# Patient Record
Sex: Female | Born: 1955
Health system: Southern US, Community
[De-identification: ages and names within clinical notes are randomized; demographics above are authoritative.]

## PROBLEM LIST (undated history)

## (undated) ENCOUNTER — Emergency Department (HOSPITAL_COMMUNITY): Admission: EM | Payer: Medicare Other | Source: Home / Self Care

## (undated) DIAGNOSIS — R188 Other ascites: Secondary | ICD-10-CM

## (undated) DIAGNOSIS — G4700 Insomnia, unspecified: Secondary | ICD-10-CM

## (undated) DIAGNOSIS — K567 Ileus, unspecified: Secondary | ICD-10-CM

## (undated) DIAGNOSIS — Z8701 Personal history of pneumonia (recurrent): Secondary | ICD-10-CM

## (undated) DIAGNOSIS — N200 Calculus of kidney: Secondary | ICD-10-CM

## (undated) DIAGNOSIS — Z9889 Other specified postprocedural states: Secondary | ICD-10-CM

## (undated) DIAGNOSIS — R9431 Abnormal electrocardiogram [ECG] [EKG]: Secondary | ICD-10-CM

## (undated) DIAGNOSIS — T4145XA Adverse effect of unspecified anesthetic, initial encounter: Secondary | ICD-10-CM

## (undated) DIAGNOSIS — G629 Polyneuropathy, unspecified: Secondary | ICD-10-CM

## (undated) DIAGNOSIS — F329 Major depressive disorder, single episode, unspecified: Secondary | ICD-10-CM

## (undated) DIAGNOSIS — J189 Pneumonia, unspecified organism: Secondary | ICD-10-CM

## (undated) DIAGNOSIS — M159 Polyosteoarthritis, unspecified: Secondary | ICD-10-CM

## (undated) DIAGNOSIS — F419 Anxiety disorder, unspecified: Secondary | ICD-10-CM

## (undated) DIAGNOSIS — T8859XA Other complications of anesthesia, initial encounter: Secondary | ICD-10-CM

## (undated) DIAGNOSIS — R161 Splenomegaly, not elsewhere classified: Secondary | ICD-10-CM

## (undated) DIAGNOSIS — K7460 Unspecified cirrhosis of liver: Secondary | ICD-10-CM

## (undated) DIAGNOSIS — Z9289 Personal history of other medical treatment: Secondary | ICD-10-CM

## (undated) DIAGNOSIS — F32A Depression, unspecified: Secondary | ICD-10-CM

## (undated) DIAGNOSIS — Z8742 Personal history of other diseases of the female genital tract: Secondary | ICD-10-CM

## (undated) DIAGNOSIS — N83209 Unspecified ovarian cyst, unspecified side: Secondary | ICD-10-CM

## (undated) DIAGNOSIS — M1990 Unspecified osteoarthritis, unspecified site: Secondary | ICD-10-CM

## (undated) DIAGNOSIS — R011 Cardiac murmur, unspecified: Secondary | ICD-10-CM

## (undated) DIAGNOSIS — E039 Hypothyroidism, unspecified: Secondary | ICD-10-CM

## (undated) DIAGNOSIS — M810 Age-related osteoporosis without current pathological fracture: Secondary | ICD-10-CM

## (undated) DIAGNOSIS — G8929 Other chronic pain: Secondary | ICD-10-CM

## (undated) DIAGNOSIS — D61818 Other pancytopenia: Secondary | ICD-10-CM

## (undated) DIAGNOSIS — K769 Liver disease, unspecified: Secondary | ICD-10-CM

## (undated) DIAGNOSIS — R110 Nausea: Secondary | ICD-10-CM

## (undated) DIAGNOSIS — K8020 Calculus of gallbladder without cholecystitis without obstruction: Secondary | ICD-10-CM

## (undated) DIAGNOSIS — R87629 Unspecified abnormal cytological findings in specimens from vagina: Secondary | ICD-10-CM

## (undated) HISTORY — PX: AUGMENTATION MAMMAPLASTY: SUR837

## (undated) HISTORY — DX: Anxiety disorder, unspecified: F41.9

## (undated) HISTORY — DX: Polyosteoarthritis, unspecified: M15.9

## (undated) HISTORY — PX: FRACTURE SURGERY: SHX138

## (undated) HISTORY — DX: Unspecified cirrhosis of liver: K74.60

## (undated) HISTORY — DX: Nausea: R11.0

## (undated) HISTORY — DX: Depression, unspecified: F32.A

## (undated) HISTORY — PX: KNEE SURGERY: SHX244

## (undated) HISTORY — DX: Other ascites: R18.8

## (undated) HISTORY — DX: Other chronic pain: G89.29

## (undated) HISTORY — DX: Calculus of gallbladder without cholecystitis without obstruction: K80.20

## (undated) HISTORY — DX: Age-related osteoporosis without current pathological fracture: M81.0

## (undated) HISTORY — PX: HIP SURGERY: SHX245

## (undated) HISTORY — DX: Personal history of pneumonia (recurrent): Z87.01

## (undated) HISTORY — DX: Personal history of other diseases of the female genital tract: Z87.42

## (undated) HISTORY — DX: Unspecified abnormal cytological findings in specimens from vagina: R87.629

## (undated) HISTORY — DX: Abnormal electrocardiogram (ECG) (EKG): R94.31

## (undated) HISTORY — PX: COLONOSCOPY WITH ESOPHAGOGASTRODUODENOSCOPY (EGD) AND ESOPHAGEAL DILATION (ED): SHX6495

## (undated) HISTORY — DX: Unspecified ovarian cyst, unspecified side: N83.209

## (undated) HISTORY — DX: Splenomegaly, not elsewhere classified: R16.1

## (undated) HISTORY — PX: ESOPHAGOGASTRODUODENOSCOPY W/ BANDING: SHX1530

## (undated) HISTORY — DX: Insomnia, unspecified: G47.00

## (undated) HISTORY — DX: Calculus of kidney: N20.0

## (undated) HISTORY — PX: ORIF HIP FRACTURE: SHX2125

## (undated) NOTE — *Deleted (*Deleted)
[facility]  PROGRESS NOTE  Megan Roberson JHE:[id number] DOB: 07-07-1956 DOA: 08/13/2020 PCP: Celene Squibb, MD Admit date - 08/13/2020   Admitting Physician Bernadette Hoit, DO  Outpatient Primary MD for the patient is Celene Squibb, MD  LOS - 2 Brief Narrative   Megan Roberson is a 45 y.o. year old female with medical history significant for Pancytopenia, cirrhosis of liver, hypothyroidism, GERD who presents on 08/13/2020 with generalized weakness, nausea, recurrent falls and left foot pain and was found to have profound ascites, hepatic encephalopathy, and acute fracture of medial cuneiform of the left foot.  Patient was started on ceftriaxone due to concern for UTI given positive UA but patient denies any urinary symptoms.  Patient underwent IR guided paracentesis on 10/13 with removal of 2 L of ascitic fluid.   Subjective  Today increased nausea, decreased appetite, increased weight loss over several weeks.  As well as recurrent falls.  This morning states she still feels uncomfortable with some abdominal pain, denies any urinary symptoms, no cough, no emesis, no melena.  A & P   Decompensated cirrhosis with hepatic encephalopathy and ascites in the setting of nonadherence with lactulose therapy due to nausea/poor taste.  Now much more alert and oriented to self and place in context.  Denies any hematemesis or melena.  Has a history of esophageal varices.  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  Iron panel seems more consistent with iron deficiency.  Not having any active signs of bleeding -Closely monitor CBC, transfuse hemoglobin  less than 7, platelets less than 10,000  Generalized weakness with recurrent falls, high concern for failure to thrive Suspect general failure to thrive in setting of chronic liver disease, B12 within normal limits, TSH slightly elevated at 6.5, thiamine deficiency also possibility, electrolytes all within normal limits -Check B1 -Continue home Synthroid -PT eval given left foot fracture -Dietary consulted, protein supplementation  Abnormal UA.  Does not have any urinary symptoms.  Not likely UTI -Continue to monitor urine culture, IV ceftriaxone for SBP rule out  GERD, stable -Continue oral PPI continue IV ceftriaxone while rule out SBP above  Mood disorder, stable -Continue home Zoloft  Left minimally displaced medial cuneiform foot fracture secondary to fall Cast placed in the ED, outpatient orthopedic follow-up -Oxycodone 5 mg every 8 hours for moderate/severe pain   Family Communication  : None at bedside  Code Status : Full  Disposition Plan  :  Patient is from home. Anticipated d/c date:  1-2. Barriers to d/c or necessity for inpatient status:  Currently requiring IV ceftriaxone due to high concern for SBP, awaiting fluid analysis of ascites, also closely monitor hemoglobin, as well as improvement for sustained normal mental status in setting of hepatic encephalopathy Consults  : None  Procedures  : IR guided paracentesis, 10/13  DVT Prophylaxis  : SCDs MDM: The below labs and imaging reports were reviewed and summarized above.  Medication management as above.  Lab Results  Component Value Date   PLT 50 (L) 08/15/2020    Diet :  Diet Order            Diet 2 gram sodium Room service appropriate? Yes; Fluid consistency: Thin  Diet effective  now                  Inpatient Medications Scheduled Meds: . feeding supplement  237 mL Oral BID BM  . furosemide  40 mg Oral Daily  . lactulose  20 g Oral TID  . levothyroxine  75 mcg Oral QAC breakfast  .  pantoprazole  40 mg Oral Daily  . potassium chloride  20 mEq Oral Q T,Th,S,Su  . rifaximin  550 mg Oral BID  . sertraline  50 mg Oral Daily  . spironolactone  150 mg Oral Daily   Continuous Infusions: . cefTRIAXone (ROCEPHIN)  IV Stopped (08/15/20 0817)   PRN Meds:.diclofenac Sodium, ondansetron, oxyCODONE, prochlorperazine, traZODone  Antibiotics  :   Anti-infectives (From admission, onward)   Start     Dose/Rate Route Frequency Ordered Stop   08/14/20 2359  cefTRIAXone (ROCEPHIN) 1 g in sodium chloride 0.9 % 100 mL IVPB        1 g 200 mL/hr over 30 Minutes Intravenous Every 24 hours 08/14/20 0120     08/14/20 1000  rifaximin (XIFAXAN) tablet 550 mg        550 mg Oral 2 times daily 08/14/20 0834     08/13/20 2300  cefTRIAXone (ROCEPHIN) 1 g in sodium chloride 0.9 % 100 mL IVPB        1 g 200 mL/hr over 30 Minutes Intravenous  Once 08/13/20 2257 08/14/20 0026       Objective   Vitals:   08/14/20 2048 08/15/20 0442 08/15/20 0900 08/15/20 0933  BP: 116/72 127/73 114/71 110/79  Pulse: 77 76 80   Resp: 16 18 17    Temp: 99.1 F (37.3 C) 98.7 F (37.1 C) 98.8 F (37.1 C)   TempSrc: Oral Oral Oral   SpO2: 97% 93% 96%   Weight:      Height:        SpO2: 96 % O2 Flow Rate (L/min): 0 L/min FiO2 (%): 21 %  Wt Readings from Last 3 Encounters:  08/14/20 57 kg  08/06/20 54.9 kg  06/04/20 63.5 kg     Intake/Output Summary (Last 24 hours) at 08/15/2020 1307 Last data filed at 08/15/2020 0500 Gross per 24 hour  Intake 100 ml  Output -  Net 100 ml    Physical Exam:     Awake Alert, Oriented to self, place, context Thin, frail No new F.N deficits,  [place].AT, Normal respiratory effort on room air, CTAB RRR,No Gallops,Rubs or new Murmurs, Decreased abdominal sounds, soft distended abdomen, soft, no rebound tenderness Bruising along left toes   I have personally reviewed the following:   Data Reviewed:  CBC Recent Labs  Lab 08/13/20 1815 08/14/20 0621  08/14/20 1504 08/15/20 0616  WBC 8.1 5.4 5.0 5.4  HGB 9.2* 7.5* 7.8* 7.9*  HCT 29.8* 25.4* 26.1* 26.8*  PLT 45* 42* 38* 50*  MCV 85.1 85.2 84.7 84.8  MCH 26.3 25.2* 25.3* 25.0*  MCHC 30.9 29.5* 29.9* 29.5*  RDW 19.4* 19.3* 19.2* 19.4*  LYMPHSABS 0.7  --   --  0.5*  MONOABS 0.9  --   --  0.8  EOSABS 0.2  --   --  0.2  BASOSABS 0.0  --   --  0.1    Chemistries  Recent Labs  Lab 08/13/20 1815 08/14/20 0621 08/15/20 0616  NA 132* 133* 132*  K 4.3 3.6 3.5  CL 103 104 103  CO2 20* 22 22  GLUCOSE 150* 99 83  BUN 18 16  14  CREATININE 0.60 0.50 0.53  CALCIUM 8.8* 8.3* 8.1*  MG  --  1.9  --   AST 48* 39  --   ALT 29 26  --   ALKPHOS 156* 115  --   BILITOT 2.9* 2.3*  --    ------------------------------------------------------------------------------------------------------------------ No results for input(s): CHOL, HDL, LDLCALC, TRIG, CHOLHDL, LDLDIRECT in the last 72 hours.  No results found for: HGBA1C ------------------------------------------------------------------------------------------------------------------ Recent Labs    08/13/20 1815  TSH 6.506*   ------------------------------------------------------------------------------------------------------------------ Recent Labs    08/13/20 1815  VITAMINB12 604  FOLATE 16.9  FERRITIN 36  TIBC 340  IRON 28    Coagulation profile Recent Labs  Lab 08/13/20 1815 08/14/20 0621  INR 1.3* 1.4*    No results for input(s): DDIMER in the last 72 hours.  Cardiac Enzymes No results for input(s): CKMB, TROPONINI, MYOGLOBIN in the last 168 hours.  Invalid input(s): CK ------------------------------------------------------------------------------------------------------------------    Component Value Date/Time   BNP 114.0 (H) 05/18/2020 2209    Micro Results Recent Results (from the past 240 hour(s))  Gram stain     Status: None   Collection Time: 08/08/20  2:24 PM   Specimen: Abdomen; Peritoneal Fluid   Result Value Ref Range Status   Specimen Description PERITONEAL ABDOMEN  Final   Special Requests NONE  Final   Gram Stain   Final    WBC PRESENT,BOTH PMN AND MONONUCLEAR NO ORGANISMS SEEN CYTOSPIN SMEAR Performed at [facility], [address], [place], [place] [address]    Report Status 08/08/2020 FINAL  Final  Culture, body fluid-bottle     Status: None   Collection Time: 08/08/20  2:24 PM   Specimen: Fluid  Result Value Ref Range Status   Specimen Description FLUID ABDOMEN PERITONEAL  Final   Special Requests BOTTLES DRAWN AEROBIC AND ANAEROBIC  Final   Culture   Final    NO GROWTH 5 DAYS Performed at [facility], [place] [address] [place], [place] [address]    Report Status 08/13/2020 FINAL  Final  Resp Panel by RT PCR (RSV, Flu A&B, Covid) - Nasopharyngeal Swab     Status: None   Collection Time: 08/13/20  9:42 PM   Specimen: Nasopharyngeal Swab  Result Value Ref Range Status   SARS Coronavirus 2 by RT PCR NEGATIVE NEGATIVE Final    Comment: (NOTE) SARS-CoV-2 target nucleic acids are NOT DETECTED.  The SARS-CoV-2 RNA is generally detectable in upper respiratoy specimens during the acute phase of infection. The lowest concentration of SARS-CoV-2 viral copies this assay can detect is 131 copies/mL. A negative result does not preclude SARS-Cov-2 infection and should not be used as the sole basis for treatment or other patient management decisions. A negative result may occur with  improper specimen collection/handling, submission of specimen other than nasopharyngeal swab, presence of viral mutation(s) within the areas targeted by this assay, and inadequate number of viral copies (<131 copies/mL). A negative result must be combined with clinical observations, patient history, and epidemiological information. The expected result is Negative.  Fact Sheet for Patients:  PinkCheek.be  Fact Sheet for Healthcare Providers:   GravelBags.it  This test is no t yet approved or cleared by the [place] FDA and  has been authorized for detection and/or diagnosis of SARS-CoV-2 by FDA under an Emergency Use Authorization (EUA). This EUA will remain  in effect (meaning this test can be used) for the duration of the COVID-19 declaration under Section 564(b)(1) of the Act,  21 U.S.C. section 360bbb-3(b)(1), unless the authorization is terminated or revoked sooner.     Influenza A by PCR NEGATIVE NEGATIVE Final   Influenza B by PCR NEGATIVE NEGATIVE Final    Comment: (NOTE) The Xpert Xpress SARS-CoV-2/FLU/RSV assay is intended as an aid in  the diagnosis of influenza from Nasopharyngeal swab specimens and  should not be used as a sole basis for treatment. Nasal washings and  aspirates are unacceptable for Xpert Xpress SARS-CoV-2/FLU/RSV  testing.  Fact Sheet for Patients: PinkCheek.be  Fact Sheet for Healthcare Providers: GravelBags.it  This test is not yet approved or cleared by the [place] FDA and  has been authorized for detection and/or diagnosis of SARS-CoV-2 by  FDA under an Emergency Use Authorization (EUA). This EUA will remain  in effect (meaning this test can be used) for the duration of the  Covid-19 declaration under Section 564(b)(1) of the Act, 21  U.S.C. section 360bbb-3(b)(1), unless the authorization is  terminated or revoked.    Respiratory Syncytial Virus by PCR NEGATIVE NEGATIVE Final    Comment: (NOTE) Fact Sheet for Patients: PinkCheek.be  Fact Sheet for Healthcare Providers: GravelBags.it  This test is not yet approved or cleared by the [place] FDA and  has been authorized for detection and/or diagnosis of SARS-CoV-2 by  FDA under an Emergency Use Authorization (EUA). This EUA will remain  in effect (meaning this test can be  used) for the duration of the  COVID-19 declaration under Section 564(b)(1) of the Act, 21 U.S.C.  section 360bbb-3(b)(1), unless the authorization is terminated or  revoked. Performed at [facility], [address] [place], [place] [address]   Urine culture     Status: Abnormal (Preliminary result)   Collection Time: 08/13/20 10:52 PM   Specimen: Urine, Random  Result Value Ref Range Status   Specimen Description   Final    URINE, RANDOM Performed at [facility], [address], [place], [place] [address]    Special Requests   Final    NONE Performed at [facility], [address], [place], [place] [address]    Culture (A)  Final    >=100,000 COLONIES/mL CITROBACTER FREUNDII SUSCEPTIBILITIES TO FOLLOW Performed at [facility], [place] [address] [place], [place] [address]    Report Status PENDING  Incomplete  Body fluid culture     Status: None (Preliminary result)   Collection Time: 08/14/20 11:36 AM   Specimen: Abdomen; Peritoneal Fluid  Result Value Ref Range Status   Specimen Description   Final    ABDOMEN Performed at [facility], [address], [place], [place] [address]    Special Requests   Final    NONE Performed at [facility], [address] [place], [place] [address]    Gram Stain NO WBC SEEN NO ORGANISMS SEEN   Final   Culture   Final    NO GROWTH < 12 HOURS Performed at [facility], [place] [address], [place], [place] [address]    Report Status PENDING  Incomplete    Radiology Reports CT Head Wo Contrast  Result Date: 08/13/2020 CLINICAL DATA:  Fall, head injury EXAM: CT HEAD WITHOUT CONTRAST TECHNIQUE: Contiguous axial images were obtained from the base of the skull through the vertex without intravenous contrast. COMPARISON:  None. FINDINGS: Brain: Normal anatomic configuration. Parenchymal volume loss is commensurate with the patient's age. No abnormal intra or extra-axial mass lesion or fluid collection. No abnormal mass effect or  midline shift. No  evidence of acute intracranial hemorrhage or infarct. Ventricular size is normal. Cerebellum unremarkable. Vascular: No asymmetric hyperdense vasculature at the skull base. Skull: Intact Sinuses/Orbits: Paranasal sinuses are clear. Orbits are unremarkable. Other: Mastoid air cells and middle ear cavities are clear. IMPRESSION: No acute intracranial injury.  No calvarial fracture. Electronically Signed   By: Fidela Salisbury MD   On: 08/13/2020 22:06   US Paracentesis  Result Date: 08/14/2020 INDICATION: Ascites secondary to hepatic cirrhosis. Request for diagnostic and therapeutic paracentesis. EXAM: ULTRASOUND GUIDED PARACENTESIS MEDICATIONS: 1% lidocaine 10 mL COMPLICATIONS: None immediate. PROCEDURE: Informed written consent was obtained from the patient after a discussion of the risks, benefits and alternatives to treatment. A timeout was performed prior to the initiation of the procedure. Initial ultrasound scanning demonstrates a moderate amount of ascites within the left lateral abdomen quadrant. The left lateral abdomen was prepped and draped in the usual sterile fashion. 1% lidocaine was used for local anesthesia. Following this, a 19 gauge, 7-cm, Yueh catheter was introduced. An ultrasound image was saved for documentation purposes. The paracentesis was performed. The catheter was removed and a dressing was applied. The patient tolerated the procedure well without immediate post procedural complication. FINDINGS: A total of approximately 2.2 L of clear yellow fluid was removed. Samples were sent to the laboratory as requested by the clinical team. IMPRESSION: Successful ultrasound-guided paracentesis yielding 2.2 liters of peritoneal fluid. Read by: Gareth Eagle, PA-C Electronically Signed   By: Marijo Conception M.D.   On: 08/14/2020 12:53   DG Abd Portable 1V  Result Date: 08/15/2020 CLINICAL DATA:  Abdominal pain and distension EXAM: PORTABLE ABDOMEN - 1 VIEW COMPARISON:  CT  abdomen and pelvis July 07, 2020 FINDINGS: There is mild dilatation of loops of colon. There is mild wall thickening in the descending colon which may have inflammatory etiology. There is moderate stool in the colon. There is no appreciable air-fluid levels. There are gallstones in the right upper quadrant. There is a calcification in the region of the right renal pelvis measuring 1.2 x 0.8 cm. There is postoperative change in the proximal femur. No evident free air. Visualized lung bases are. IMPRESSION: Question a degree of colitis. No bowel obstruction appreciable. No free air. Gallstones evident in right upper quadrant. Visualized lung bases clear. Calcification in the region of the right renal pelvis measuring 1.2 x 0.8 cm. Obstruction at or near the right ureteropelvic junction from this apparent calculus cannot be excluded by radiography. Electronically Signed   By: Lowella Grip III M.D.   On: 08/15/2020 12:50   DG Foot Complete Left  Result Date: 08/13/2020 CLINICAL DATA:  Fall, left foot bruising EXAM: LEFT FOOT - COMPLETE 3+ VIEW COMPARISON:  None. 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 There is an acute intra-articular fracture of the probable medial navicular noted dorsally with fracture fragments in near anatomic alignment. Soft tissues are unremarkable. Joint spaces appear preserved. IMPRESSION: Tiny minimally displaced fracture of the probable medial cuneiform dorsally with fracture fragments in anatomic alignment. Electronically Signed   By: Fidela Salisbury MD   On: 08/13/2020 22:31   IR Paracentesis  Result Date: 08/08/2020 INDICATION: Patient with history of NASH cirrhosis, new onset ascites. Request to IR for diagnostic and therapeutic paracentesis. EXAM: ULTRASOUND GUIDED DIAGNOSTIC AND THERAPEUTIC PARACENTESIS MEDICATIONS: 10 mL 1% lidocaine COMPLICATIONS: None immediate. PROCEDURE:  Informed written consent was obtained from the patient after a discussion  of the risks, benefits and alternatives to treatment. A timeout was performed prior to the initiation of the procedure. Initial ultrasound scanning demonstrates a large amount of ascites within the right lower abdominal quadrant. The right lower abdomen was prepped and draped in the usual sterile fashion. 1% lidocaine was used for local anesthesia. Following this, a 19 gauge, 7-cm, Yueh catheter was introduced. An ultrasound image was saved for documentation purposes. The paracentesis was performed. The catheter was removed and a dressing was applied. The patient tolerated the procedure well without immediate post procedural complication. FINDINGS: A total of approximately 3.3 L of clear, light yellow fluid was removed. Samples were sent to the laboratory as requested by the clinical team. IMPRESSION: Successful ultrasound-guided paracentesis yielding 3.3 liters of peritoneal fluid. Read by Candiss Norse, PA-C Electronically Signed   By: Corrie Mckusick D.O.   On: 08/08/2020 15:24     Time Spent in minutes  30     Desiree Hane M.D on 08/15/2020 at 1:07 PM  To page go to [email or web address] - password [facility]

---
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
## 2017-04-20 ENCOUNTER — Inpatient Hospital Stay (HOSPITAL_COMMUNITY)
Admission: AD | Admit: 2017-04-20 | Discharge: 2017-04-21 | DRG: 603 | Disposition: A | Discharge status: Home / Self Care | Payer: Medicare Other | Source: Other Acute Inpatient Hospital | Attending: Family Medicine | Admitting: Family Medicine

## 2017-04-20 ENCOUNTER — Encounter (HOSPITAL_COMMUNITY): Payer: Self-pay

## 2017-04-20 DIAGNOSIS — K7460 Unspecified cirrhosis of liver: Secondary | ICD-10-CM | POA: Diagnosis present

## 2017-04-20 DIAGNOSIS — E039 Hypothyroidism, unspecified: Secondary | ICD-10-CM | POA: Diagnosis present

## 2017-04-20 DIAGNOSIS — Z4802 Encounter for removal of sutures: Secondary | ICD-10-CM

## 2017-04-20 DIAGNOSIS — S72002D Fracture of unspecified part of neck of left femur, subsequent encounter for closed fracture with routine healing: Secondary | ICD-10-CM

## 2017-04-20 DIAGNOSIS — S52023A Displaced fracture of olecranon process without intraarticular extension of unspecified ulna, initial encounter for closed fracture: Secondary | ICD-10-CM | POA: Diagnosis present

## 2017-04-20 DIAGNOSIS — Z880 Allergy status to penicillin: Secondary | ICD-10-CM | POA: Diagnosis not present

## 2017-04-20 DIAGNOSIS — S52022D Displaced fracture of olecranon process without intraarticular extension of left ulna, subsequent encounter for closed fracture with routine healing: Secondary | ICD-10-CM

## 2017-04-20 DIAGNOSIS — Z882 Allergy status to sulfonamides status: Secondary | ICD-10-CM | POA: Diagnosis not present

## 2017-04-20 DIAGNOSIS — L03114 Cellulitis of left upper limb: Secondary | ICD-10-CM | POA: Diagnosis present

## 2017-04-20 DIAGNOSIS — Z9889 Other specified postprocedural states: Secondary | ICD-10-CM

## 2017-04-20 DIAGNOSIS — K7030 Alcoholic cirrhosis of liver without ascites: Secondary | ICD-10-CM | POA: Diagnosis present

## 2017-04-20 DIAGNOSIS — R188 Other ascites: Secondary | ICD-10-CM | POA: Diagnosis present

## 2017-04-20 DIAGNOSIS — E876 Hypokalemia: Secondary | ICD-10-CM | POA: Diagnosis present

## 2017-04-20 DIAGNOSIS — D649 Anemia, unspecified: Secondary | ICD-10-CM | POA: Diagnosis present

## 2017-04-20 DIAGNOSIS — S42402A Unspecified fracture of lower end of left humerus, initial encounter for closed fracture: Secondary | ICD-10-CM

## 2017-04-20 DIAGNOSIS — D61818 Other pancytopenia: Secondary | ICD-10-CM | POA: Diagnosis present

## 2017-04-20 DIAGNOSIS — K7290 Hepatic failure, unspecified without coma: Secondary | ICD-10-CM | POA: Diagnosis present

## 2017-04-20 HISTORY — DX: Hypothyroidism, unspecified: E03.9

## 2017-04-20 HISTORY — DX: Cardiac murmur, unspecified: R01.1

## 2017-04-20 HISTORY — DX: Major depressive disorder, single episode, unspecified: F32.9

## 2017-04-20 HISTORY — DX: Unspecified cirrhosis of liver: K74.60

## 2017-04-21 ENCOUNTER — Encounter (HOSPITAL_COMMUNITY): Payer: Self-pay | Admitting: Internal Medicine

## 2017-04-21 ENCOUNTER — Inpatient Hospital Stay (HOSPITAL_COMMUNITY): Payer: Medicare Other

## 2017-04-21 DIAGNOSIS — L03114 Cellulitis of left upper limb: Principal | ICD-10-CM

## 2017-04-21 DIAGNOSIS — Z9889 Other specified postprocedural states: Secondary | ICD-10-CM

## 2017-04-21 DIAGNOSIS — S52023A Displaced fracture of olecranon process without intraarticular extension of unspecified ulna, initial encounter for closed fracture: Secondary | ICD-10-CM | POA: Diagnosis present

## 2017-04-21 DIAGNOSIS — K7290 Hepatic failure, unspecified without coma: Secondary | ICD-10-CM | POA: Diagnosis present

## 2017-04-21 DIAGNOSIS — K7460 Unspecified cirrhosis of liver: Secondary | ICD-10-CM | POA: Diagnosis present

## 2017-04-21 DIAGNOSIS — K7030 Alcoholic cirrhosis of liver without ascites: Secondary | ICD-10-CM

## 2017-04-21 DIAGNOSIS — D649 Anemia, unspecified: Secondary | ICD-10-CM

## 2017-04-21 DIAGNOSIS — D61818 Other pancytopenia: Secondary | ICD-10-CM | POA: Diagnosis present

## 2017-04-21 DIAGNOSIS — E876 Hypokalemia: Secondary | ICD-10-CM | POA: Diagnosis present

## 2017-04-21 DIAGNOSIS — Z4802 Encounter for removal of sutures: Secondary | ICD-10-CM

## 2017-04-21 DIAGNOSIS — R188 Other ascites: Secondary | ICD-10-CM | POA: Diagnosis present

## 2017-04-21 LAB — BASIC METABOLIC PANEL
Anion gap: 8 (ref 5–15)
BUN: 5 mg/dL — ABNORMAL LOW (ref 6–20)
CO2: 25 mmol/L (ref 22–32)
Calcium: 7.9 mg/dL — ABNORMAL LOW (ref 8.9–10.3)
Chloride: 106 mmol/L (ref 101–111)
Creatinine, Ser: 0.8 mg/dL (ref 0.44–1.00)
GFR calc Af Amer: >60 mL/min (ref >60)
GFR calc non Af Amer: >60 mL/min (ref >60)
Glucose, Bld: 114 mg/dL — ABNORMAL HIGH (ref 65–99)
Potassium: 3.4 mmol/L — ABNORMAL LOW (ref 3.5–5.1)
Sodium: 139 mmol/L (ref 135–145)

## 2017-04-21 LAB — HEPATIC FUNCTION PANEL
ALT: 15 U/L (ref 14–54)
AST: 45 U/L — ABNORMAL HIGH (ref 15–41)
Albumin: 2.6 g/dL — ABNORMAL LOW (ref 3.5–5.0)
Alkaline Phosphatase: 193 U/L — ABNORMAL HIGH (ref 38–126)
Bilirubin, Direct: 0.9 mg/dL — ABNORMAL HIGH (ref 0.1–0.5)
Indirect Bilirubin: 1.5 mg/dL — ABNORMAL HIGH (ref 0.3–0.9)
Total Bilirubin: 2.4 mg/dL — ABNORMAL HIGH (ref 0.3–1.2)
Total Protein: 5.7 g/dL — ABNORMAL LOW (ref 6.5–8.1)

## 2017-04-21 LAB — CBC WITH DIFFERENTIAL/PLATELET
Basophils Absolute: 0 10*3/uL (ref 0.0–0.1)
Basophils Relative: 0 %
Eosinophils Absolute: 0.2 10*3/uL (ref 0.0–0.7)
Eosinophils Relative: 3 %
HCT: 32.1 % — ABNORMAL LOW (ref 36.0–46.0)
Hemoglobin: 9.6 g/dL — ABNORMAL LOW (ref 12.0–15.0)
Lymphocytes Relative: 19 %
Lymphs Abs: 0.8 10*3/uL (ref 0.7–4.0)
MCH: 27.3 pg (ref 26.0–34.0)
MCHC: 29.9 g/dL — ABNORMAL LOW (ref 30.0–36.0)
MCV: 91.2 fL (ref 78.0–100.0)
Monocytes Absolute: 0.6 10*3/uL (ref 0.1–1.0)
Monocytes Relative: 14 %
Neutro Abs: 2.8 10*3/uL (ref 1.7–7.7)
Neutrophils Relative %: 64 %
Platelets: 53 10*3/uL — ABNORMAL LOW (ref 150–400)
RBC: 3.52 MIL/uL — ABNORMAL LOW (ref 3.87–5.11)
RDW: 18.3 % — ABNORMAL HIGH (ref 11.5–15.5)
WBC: 4.5 10*3/uL (ref 4.0–10.5)

## 2017-04-21 LAB — IRON AND TIBC
Iron: 37 ug/dL (ref 28–170)
Saturation Ratios: 12 % (ref 10.4–31.8)
TIBC: 302 ug/dL (ref 250–450)
UIBC: 265 ug/dL

## 2017-04-21 LAB — FOLATE: Folate: 17 ng/mL (ref >5.9)

## 2017-04-21 LAB — HIV ANTIBODY (ROUTINE TESTING W REFLEX): HIV Screen 4th Generation wRfx: NONREACTIVE

## 2017-04-21 LAB — RETICULOCYTES
RBC.: 3.63 MIL/uL — ABNORMAL LOW (ref 3.87–5.11)
Retic Count, Absolute: 130.7 10*3/uL (ref 19.0–186.0)
Retic Ct Pct: 3.6 % — ABNORMAL HIGH (ref 0.4–3.1)

## 2017-04-21 LAB — FERRITIN: Ferritin: 68 ng/mL (ref 11–307)

## 2017-04-21 LAB — VITAMIN B12: Vitamin B-12: 1038 pg/mL — ABNORMAL HIGH (ref 180–914)

## 2017-04-21 MED ORDER — ACETAMINOPHEN 325 MG PO TABS: 650.0000 mg | ORAL_TABLET | Freq: Four times a day (QID) | ORAL | Status: DC | PRN

## 2017-04-21 MED ORDER — OXYCODONE HCL 5 MG PO TABS
5.0000 mg | ORAL_TABLET | Freq: Four times a day (QID) | ORAL | Status: DC | PRN
  Administered 2017-04-21 (×3): 5 mg via ORAL
  Filled 2017-04-21 (×3): qty 1

## 2017-04-21 MED ORDER — CLINDAMYCIN PHOSPHATE 600 MG/50ML IV SOLN
600.0000 mg | Freq: Three times a day (TID) | INTRAVENOUS | Status: DC
  Administered 2017-04-21 (×2): 600 mg via INTRAVENOUS
  Filled 2017-04-21 (×3): qty 50

## 2017-04-21 MED ORDER — ACETAMINOPHEN 650 MG RE SUPP: 650.0000 mg | Freq: Four times a day (QID) | RECTAL | Status: DC | PRN

## 2017-04-21 MED ORDER — POTASSIUM CHLORIDE CRYS ER 20 MEQ PO TBCR
40.0000 meq | EXTENDED_RELEASE_TABLET | Freq: Once | ORAL | Status: AC
  Administered 2017-04-21: 40 meq via ORAL
  Filled 2017-04-21: qty 2

## 2017-04-21 MED ORDER — ONDANSETRON HCL 4 MG PO TABS: 4.0000 mg | ORAL_TABLET | Freq: Four times a day (QID) | ORAL | Status: DC | PRN

## 2017-04-21 MED ORDER — ONDANSETRON HCL 4 MG/2ML IJ SOLN: 4.0000 mg | Freq: Four times a day (QID) | INTRAMUSCULAR | Status: DC | PRN

## 2017-04-21 NOTE — Discharge Summary (Signed)
Physician Discharge Summary  Megan Roberson XNA:[id number] DOB: May 23, 1956 DOA: 04/20/2017  PCP: Patient, No Pcp Per Orthopedics: Dr. Stann Mainland Hand surgery: Dr.  Amedeo Plenty  Admit date: 04/20/2017 Discharge date: 04/21/2017  Admitted From: Home  Disposition: Home   Recommendations for Outpatient Follow-up:  1. Follow up with hand surgery orthopedics in 1 weeks 2. Follow up with Dr Stann Mainland in 2-4 weeks  Discharge Condition: STABLE   CODE STATUS: FULL    Brief/Interim Summary: Megan Roberson suffered a fall about a month ago in [place] where she broke her left elbow and hip. She was planning to move to [place] the next day. Once she was in rehab (I think in a SNF) it became to expensive for the moving company to keep their things and they decided to come on down. She has had no rehab here for the last couple of weeks. She went to the hospital in [place] to see about her elbow as it was still quite painful and somewhat red. She notes that her pain is unchanged from surgery. She denies any drainage, fevers, chills, or sweats. Her hip has been doing well.  Pt was initially started on antibiotics and orthopedics was consulted.  See below notes:  S/p left elbow ORIF -- From her description it sounds like she broke her olecranon. X-rays obtained in [place] are not available for viewing. I have ordered some baseline films to help with longitudinal follow-up. I do not believe she has an infection and the mild erythema she does have is likely 2/2 skin reaction to her sutures. They will be removed today. Ok to stop abx (will d/c). She needs an outpatient therapy program to help regain her motion; we will get that set up for her or at least get her an order so she can go to a place closer to home. S/p left hip ORIF -- Likely antegrade IMN based on her incisions. Sutures to be removed today. Will again get baseline x-rays. She should see Dr. Stann Mainland in 2-4 weeks and should call for an appointment.  Pt ok to discharge from an  orthopedic standpoint once her x-rays are complete.  UPDATE: Hip x-rays look ok, fine to f/u with Dr. Stann Mainland in 2-4 weeks. Elbow x-rays show olecranon fixation failure, will need to f/u with hand surgery to schedule revision. That should happen in the next week. Still ok to discharge, NWB LUE and should not begin PT/OT until directed by hand surgery.  Discharge Diagnoses:  Principal Problem:   Cellulitis of left elbow Active Problems:   Cirrhosis of liver (HCC)   Anemia   Cellulitis    Discharge Instructions  Discharge Instructions    Increase activity slowly    Complete by:  As directed      Allergies as of 04/21/2017      Reactions   Penicillins Swelling, Other (See Comments)   As a child   Sulfa Antibiotics Itching, Rash      Medication List    TAKE these medications   acetaminophen 325 MG tablet Commonly known as:  TYLENOL Take 2 tablets (650 mg total) by mouth every 6 (six) hours as needed for mild pain (or Fever >/= 101).   GABAPENTIN PO Take by mouth at bedtime as needed (pain).   HYDROXYZINE HCL PO Take by mouth as needed (itching).   LEVOTHYROXINE SODIUM PO Take by mouth every morning.   METHOCARBAMOL PO Take by mouth daily as needed (muscle spasms).   OXYCODONE HCL PO Take by mouth.   polyethylene  glycol packet Commonly known as:  MIRALAX / GLYCOLAX Take 17 g by mouth daily.   SEROQUEL PO Take by mouth at bedtime.   SERTRALINE HCL PO Take by mouth.   XIFAXAN PO Take by mouth daily.      Follow-up Information    Roseanne Kaufman, MD. Schedule an appointment as soon as possible for a visit in 1 week(s).   Specialty:  Orthopedic Surgery Contact information: [address] [place]  [address] [phone]        Nicholes Stairs, MD. Schedule an appointment as soon as possible for a visit in 1 month(s).   Specialty:  Orthopedic Surgery Contact information: [address] [place] [address] [place]  [address] [phone]          Allergies  Allergen Reactions  . Penicillins Swelling and Other (See Comments)    As a child  . Sulfa Antibiotics Itching and Rash     Procedures/Studies: Dg Elbow Complete Left (3+view)  Result Date: 04/21/2017 CLINICAL DATA:  Left elbow fracture. EXAM: LEFT ELBOW - COMPLETE 3+ VIEW COMPARISON:  None. FINDINGS: Ununited, posteriorly displaced fracture of the olecranon of the left proximal ulna with 2.5 cm of distraction. Dorsal sideplate and multiple interlocking screws transfixing the proximal ulna does not transfix the olecranon process. No other fracture or dislocation. IMPRESSION: Ununited, posteriorly displaced fracture of the olecranon of the left proximal ulna with 2.5 cm of distraction. Dorsal sideplate and multiple interlocking screws transfixing the proximal ulna does not transfix the olecranon process. Electronically Signed   By: Kathreen Devoid   On: 04/21/2017 13:00   Dg Hip Unilat With Pelvis 2-3 Views Left  Result Date: 04/21/2017 CLINICAL DATA:  Post- operative left hip surgery EXAM: DG HIP (WITH OR WITHOUT PELVIS) 2-3V LEFT COMPARISON:  None in PACs FINDINGS: The patient has undergone placement of an intramedullary rod and telescoping screw for fracture through the base of the neck and the intertrochanteric region of the left hip. Alignment is now near anatomic. There is avulsion of the lesser trochanter. The hip joint space is reasonably well-maintained. No acute post traumatic injury of the pelvis is observed but there is chronic deformity of the lateral aspect of the left iliac bone. IMPRESSION: No immediate postprocedure complication following ORIF for a neck base -intertrochanteric fracture of the left hip. Electronically Signed   By: David  Martinique M.D.   On: 04/21/2017 12:57     Pt without complaints today.  Says she will be sure to follow up with orthopedics as recommended.   Discharge Exam: Vitals:   04/21/17 0738 04/21/17 1509  BP:  116/89 103/62  Pulse: 63 (!) 59  Resp:    Temp: 98.6 F (37 C) 98.6 F (37 C)   Vitals:   04/21/17 0236 04/21/17 0738 04/21/17 1509  BP: 129/76 116/89 103/62  Pulse: 69 63 (!) 59  Resp: 16    Temp: 98.1 F (36.7 C) 98.6 F (37 C) 98.6 F (37 C)  TempSrc: Oral Oral Oral  SpO2: 100% 100% 98%    General: Pt is alert, awake, not in acute distress Cardiovascular: RRR, S1/S2 +, no rubs, no gallops Respiratory: CTA bilaterally, no wheezing, no rhonchi Abdominal: Soft, NT, ND, bowel sounds + Extremities: Left shoulder, elbow, wrist, digits- well-healed surgical incision elbow, mild TTP, minimal erythema, no instability, no blocks to motion, ext 160, flex 45 degrees, LLE Well-healed surgical incision, no ecchymosis or rash   The results of significant diagnostics from this hospitalization (including imaging,  microbiology, ancillary and laboratory) are listed below for reference.     Microbiology: No results found for this or any previous visit (from the past 240 hour(s)).   Labs: BNP (last 3 results) No results for input(s): BNP in the last 8760 hours. Basic Metabolic Panel:  Recent Labs Lab 04/21/17 0153  NA 139  K 3.4*  CL 106  CO2 25  GLUCOSE 114*  BUN 5*  CREATININE 0.80  CALCIUM 7.9*   Liver Function Tests:  Recent Labs Lab 04/21/17 0153  AST 45*  ALT 15  ALKPHOS 193*  BILITOT 2.4*  PROT 5.7*  ALBUMIN 2.6*   No results for input(s): LIPASE, AMYLASE in the last 168 hours. No results for input(s): AMMONIA in the last 168 hours. CBC:  Recent Labs Lab 04/21/17 0153  WBC 4.5  NEUTROABS 2.8  HGB 9.6*  HCT 32.1*  MCV 91.2  PLT 53*   Cardiac Enzymes: No results for input(s): CKTOTAL, CKMB, CKMBINDEX, TROPONINI in the last 168 hours. BNP: Invalid input(s): POCBNP CBG: No results for input(s): GLUCAP in the last 168 hours. D-Dimer No results for input(s): DDIMER in the last 72 hours. Hgb A1c No results for input(s): HGBA1C in the last 72  hours. Lipid Profile No results for input(s): CHOL, HDL, LDLCALC, TRIG, CHOLHDL, LDLDIRECT in the last 72 hours. Thyroid function studies No results for input(s): TSH, T4TOTAL, T3FREE, THYROIDAB in the last 72 hours.  Invalid input(s): FREET3 Anemia work up  Recent Labs  04/21/17 0805  VITAMINB12 1,038*  FOLATE 17.0  FERRITIN 68  TIBC 302  IRON 37  RETICCTPCT 3.6*   Urinalysis No results found for: COLORURINE, APPEARANCEUR, LABSPEC, PHURINE, GLUCOSEU, HGBUR, BILIRUBINUR, KETONESUR, PROTEINUR, UROBILINOGEN, NITRITE, LEUKOCYTESUR Sepsis Labs Invalid input(s): PROCALCITONIN,  WBC,  LACTICIDVEN Microbiology No results found for this or any previous visit (from the past 240 hour(s)).  Time coordinating discharge:   SIGNED:  Irwin Brakeman, MD  [facility] 04/21/2017, 4:23 PM Pager [phone]  If 7PM-7AM, please contact night-coverage [email or web address] Password [email or web address]

## 2017-04-21 NOTE — Discharge Instructions (Signed)
Non weight bearing left upper extremity Please call hand surgery as soon as possible and schedule to be seen in 1 week for revision of left upper extremity elbow repair.   Please see orthopedics Dr. Stann Mainland in 2-4 weeks to follow up hip repair.  Seek medical care or return if symptoms worsen or new problem develops.   Please establish with a primary care provider as soon as possible.

## 2017-04-21 NOTE — Progress Notes (Signed)
MEDICATION RELATED NOTE   Pharmacy Consult for Home Meds  Assessment: 61yo female admitted with possible elbow infection.  She is unclear of her home medications and we have attempted to contact family to determine what these were on two occasions and left messages for a call back.  We do not have any sure-script information since [place].  Plan:  Will enter medications from her rehab stay - however, we cannot verify if these are medications that she is currently taking or when she had last doses.  Will update her record as new information is obtained.  Microbiology: No results found for this or any previous visit (from the past 720 hour(s)).  Medical History: Past Medical History:  Diagnosis Date  . Cirrhosis of liver ([place])   . Depression   . Heart murmur   . Hypothyroidism    Rober Minion, PharmD., MS Clinical Pharmacist Pager:  [phone] Thank you for allowing pharmacy to be part of this patients care team. 04/21/2017,3:46 PM

## 2017-04-21 NOTE — Progress Notes (Signed)
Pt being discharged home via wheelchair with family. Pt alert and oriented x4. VSS. Pt c/o no pain at this time. No signs of respiratory distress. Sutures and staples removed. CDI. Education complete and care plans resolved. IV removed with catheter intact and pt tolerated well. No further issues at this time. Pt to follow up with PCP. Leanne Chang, RN

## 2017-04-21 NOTE — H&P (Signed)
History and Physical    Megan Roberson LDJ:[id number] DOB: 1956-11-01 DOA: 04/20/2017  PCP: Patient, No Pcp Per  Patient coming from: Patient was transferred from [facility].  Chief Complaint: Left elbow possible infection.  HPI: Megan Roberson is a 61 y.o. 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 Patient had a fall a month ago in [place] and had ORIF of the left hip and left elbow. Following which patient was in rehabilitation. Patient just moved to [facility] and had not had her sutures removed. On examination in the ER patient was found to have erythema of the left elbow with pain and tenderness. No active discharge. Concerning for infection patient was transferred to [facility].   ED Course: I have reviewed patient's labs in the ER. It shows anemia with hemoglobin of 9.5 platelets of 51 glucose 95 sodium 140 potassium 3.1 creatinine 0.7. X-rays were not showing any acute findings.  Review of Systems: As per HPI, rest all negative.   Past Medical History:  Diagnosis Date  . Cirrhosis of liver ([place])   . Depression   . Heart murmur   . Hypothyroidism     Past Surgical History:  Procedure Laterality Date  . FRACTURE SURGERY    . JOINT REPLACEMENT       reports that she has never smoked. She has never used smokeless tobacco. She reports that she does not drink alcohol or use drugs.  Allergies  Allergen Reactions  . Penicillins Swelling    As a child  . Sulfa Antibiotics Itching and Rash    Family History  Problem Relation Age of Onset  . Adopted: Yes    Prior to Admission medications   Not on File    Physical Exam: There were no vitals filed for this visit.    Constitutional: Moderately built and nourished. There were no vitals filed  for this visit. blood pressure was 120/60. Pulse is 80/m. Respiration 18/m. Temperature 97.4. Eyes: Anicteric mild pallor. ENMT: No discharge from the ears eyes nose or mouth. Neck: No mass felt. No neck rigidity. Respiratory: No rhonchi or crepitations. Cardiovascular: S1-S2 heard. Abdomen: Soft nontender bowel sounds present. Musculoskeletal: Left elbow pain on moving with erythema of the left elbow area. No active discharge. Skin: Erythema left elbow. Neurologic: Alert awake oriented to time place and person. Moves all extremities. Psychiatric: Appears normal. Normal affect.   Labs on Admission: I have personally reviewed following labs and imaging studies  CBC: No results for input(s): WBC, NEUTROABS, HGB, HCT, MCV, PLT in the last 168 hours. Basic Metabolic Panel: No results for input(s): NA, K, CL, CO2, GLUCOSE, BUN, CREATININE, CALCIUM, MG, PHOS in the last 168 hours. GFR: CrCl cannot be calculated (No order found.). Liver Function Tests: No results for input(s): AST, ALT, ALKPHOS, BILITOT, PROT, ALBUMIN in the last 168 hours. No results for input(s): LIPASE, AMYLASE in the last 168 hours. No results for input(s): AMMONIA in the last 168 hours. Coagulation Profile: No results for input(s): INR, PROTIME in the last 168 hours. Cardiac Enzymes: No results for input(s): CKTOTAL, CKMB, CKMBINDEX, TROPONINI in the last 168 hours. BNP (last 3 results) No results for input(s): PROBNP in the last 8760 hours. HbA1C: No results for input(s): HGBA1C in the last 72  hours. CBG: No results for input(s): GLUCAP in the last 168 hours. Lipid Profile: No results for input(s): CHOL, HDL, LDLCALC, TRIG, CHOLHDL, LDLDIRECT in the last 72 hours. Thyroid Function Tests: No results for input(s): TSH, T4TOTAL, FREET4, T3FREE, THYROIDAB in the last 72 hours. Anemia Panel: No results for input(s): VITAMINB12, FOLATE, FERRITIN, TIBC, IRON, RETICCTPCT in the last 72 hours. Urine analysis: No  results found for: COLORURINE, APPEARANCEUR, LABSPEC, PHURINE, GLUCOSEU, HGBUR, BILIRUBINUR, KETONESUR, PROTEINUR, UROBILINOGEN, NITRITE, LEUKOCYTESUR Sepsis Labs: @LABRCNTIP (procalcitonin:4,lacticidven:4) )No results found for this or any previous visit (from the past 240 hour(s)).   Radiological Exams on Admission: No results found.   Assessment/Plan Principal Problem:   Cellulitis of left elbow Active Problems:   Cirrhosis of liver (HCC)   Anemia   Cellulitis    1. Possible left elbow cellulitis - for now continue clindamycin which was started in the ER. Consult orthopedic surgeon for opinion. 2. History of liver cirrhosis secondary to alcoholism - patient states she has not had any alcohol for last 8 years. Has had recent paracentesis. Presently appears compensated. 3. History of depression on Seroquel. 4. Anemia and thrombocytopenia likely secondary to cirrhosis - follow anemia panel.  Patient's labs are pending. Home medications has to be reconciled.   DVT prophylaxis: SCDs. Code Status: Full code.  Family Communication: Discussed with patient.  Disposition Plan: Home.  Consults called: None.  Admission status: Inpatient.    Rise Patience MD [facility] Pager [phone].  If 7PM-7AM, please contact night-coverage [email or web address] Password [email or web address]  04/21/2017, 1:05 AM

## 2017-04-21 NOTE — Progress Notes (Signed)
04/20/2017  9:52 PM  04/21/2017 10:22 AM  Dionne Milo was seen and examined. 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 Patient had a fall a month ago in [place] and had ORIF of the left hip and left elbow. Following which patient was in rehabilitation. Patient just moved to [facility] and had not had her sutures removed. On examination in the ER patient was found to have erythema of the left elbow with pain and tenderness. No active discharge. Concerning for infection patient was transferred to [facility]. The H&P by the admitting provider, orders, imaging was reviewed.  Please see new orders.  Will continue to follow. I called and requested orthopedics evaluation.    Murvin Natal, MD [facility]

## 2017-04-21 NOTE — Plan of Care (Signed)
Problem: Safety: Goal: Ability to remain free from injury will improve Educated patient on calling when she needs to use the bathroom. Patient voiced understanding. Call [place] and personal belongings in reach.

## 2017-04-21 NOTE — Evaluation (Signed)
Physical Therapy Evaluation Patient Details Name: Megan Roberson MRN: [id number] DOB: 03/08/1956 Today's Date: 04/21/2017   History of Present Illness  Pt is a 61 y/o female admitted secondary to L elbow osteomyelitis. Pt is s/p ORIF of L elbow and L hip after fall in their previous home in [place]. 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 Pt reports she is scheduled for another surgery on L elbow soon.   Clinical Impression  Pt is admitted secondary to problem above with deficits below. PTA, pt required supervision with mobility using hemi walker and required assist with bathing. Upon evaluation, pt presenting with pain in L hip and L elbow, decreased balance, decreased strength. Pt is NWB in LUE and WBAT in LLE. Education provided about assist required at home, and pt and husband comfortable with assist to be provided about home. Educated about car transfers and stair navigation. Pt would benefit from follow up services, however, currently has no insurance, so therapy services unavailable. Pt has all necessary DME and necessary assist at home. Will continue to follow acutely to maximize functional mobility independence and safety.     Follow Up Recommendations No PT follow up;Supervision/Assistance - 24 hour    Equipment Recommendations  None recommended by PT    Recommendations for Other Services       Precautions / Restrictions Precautions Precautions: Fall Precaution Comments: PMH of fall  Restrictions Weight Bearing Restrictions: Yes LUE Weight Bearing: Non weight bearing LLE Weight Bearing: Weight bearing as tolerated      Mobility  Bed Mobility Overal bed mobility: Modified Independent             General bed mobility comments: Use of bed rail and extended time, but no assist required. Educated about getting bed rail for ease of transfer at home.    Transfers Overall transfer level: Needs assistance Equipment used: Hemi-walker Transfers: Sit to/from Stand Sit to Stand: Min guard;Min assist         General transfer comment: Min A for initial lift assist secondary to L hip pain. Educated husband on how to assist pt at home if needed. Husband reports he has been helping as needed prior to admission, so feels comfortable with technique.   Ambulation/Gait Ambulation/Gait assistance: Min guard Ambulation Distance (Feet): 25 Feet Assistive device: Hemi-walker Gait Pattern/deviations: Step-to pattern;Decreased step length - left;Decreased weight shift to left;Antalgic Gait velocity: Decreased Gait velocity interpretation: Below normal speed for age/gender General Gait Details: Slow, antalgic gait secondary to R hip pain. Overall steady and pt reports she only walks short distances at home.   Stairs            Wheelchair Mobility    Modified Rankin (Stroke Patients Only)       Balance Overall balance assessment: Needs assistance Sitting-balance support: No upper extremity supported;Feet supported Sitting balance-Leahy Scale: Good     Standing balance support: Single extremity supported;During functional activity Standing balance-Leahy Scale: Poor Standing balance comment: Reliant on single UE support for balance.                              Pertinent Vitals/Pain Pain Assessment: 0-10 Pain Score: 9  Pain Location: L thigh  Pain Descriptors / Indicators: Aching;Sore Pain Intervention(s): Monitored during session;Limited activity within patient's tolerance;Repositioned    Home Living Family/patient expects to be discharged to:: Private residence  Living Arrangements: Spouse/significant other Available Help at Discharge: Family;Available 24 hours/day Type of Home: House Home Access: Stairs to enter Entrance Stairs-Rails: None Entrance Stairs-Number of Steps: 2 Home Layout: One level Home Equipment:  Bedside commode;Other (comment);Grab bars - tub/shower;Wheelchair - manual (hemi walker )      Prior Function Level of Independence: Needs assistance   Gait / Transfers Assistance Needed: supervision for household distances. WC for longer distances.   ADL's / Homemaking Assistance Needed: [name] assists with bathing and supervision required for transfers into and out of tub.         Hand Dominance   Dominant Hand: Right    Extremity/Trunk Assessment   Upper Extremity Assessment Upper Extremity Assessment: LUE deficits/detail LUE Deficits / Details: NWB; ORIF of L elbow     Lower Extremity Assessment Lower Extremity Assessment: LLE deficits/detail LLE Deficits / Details: S/p ORIF of L hip secondary to hip fx. Deficits consistent with post op pain and weakness.     Cervical / Trunk Assessment Cervical / Trunk Assessment: Normal  Communication   Communication: No difficulties  Cognition Arousal/Alertness: Awake/alert Behavior During Therapy: WFL for tasks assessed/performed Overall Cognitive Status: Within Functional Limits for tasks assessed                                        General Comments General comments (skin integrity, edema, etc.): Educated about stair management technique. Pt reports husband has been helping before surgery and feel comfortable with stair navigation. Educated about car transfers and how to safely perform.     Exercises     Assessment/Plan    PT Assessment Patient needs continued PT services  PT Problem List Decreased strength;Decreased range of motion;Decreased activity tolerance;Decreased balance;Decreased mobility;Pain       PT Treatment Interventions DME instruction;Gait training;Stair training;Functional mobility training;Therapeutic activities;Therapeutic exercise;Balance training;Neuromuscular re-education;Patient/family education    PT Goals (Current goals can be found in the Care Plan section)  Acute Rehab PT  Goals Patient Stated Goal: to go home  PT Goal Formulation: With patient Time For Goal Achievement: 04/28/17 Potential to Achieve Goals: Good    Frequency Min 3X/week   Barriers to discharge        Co-evaluation               AM-PAC PT "6 Clicks" Daily Activity  Outcome Measure Difficulty turning over in bed (including adjusting bedclothes, sheets and blankets)?: A Little Difficulty moving from lying on back to sitting on the side of the bed? : A Little Difficulty sitting down on and standing up from a chair with arms (e.g., wheelchair, bedside commode, etc,.)?: Total Help needed moving to and from a bed to chair (including a wheelchair)?: A Little Help needed walking in hospital room?: A Little Help needed climbing 3-5 steps with a railing? : A Lot 6 Click Score: 15    End of Session Equipment Utilized During Treatment: Gait belt Activity Tolerance: Patient tolerated treatment well Patient left: in bed;with call bell/phone within reach;with family/visitor present Nurse Communication: Mobility status PT Visit Diagnosis: Other abnormalities of gait and mobility (R26.89);Pain Pain - Right/Left: Left Pain - part of body: Hip (elbow )    Time: 8469-6295 PT Time Calculation (min) (ACUTE ONLY): 24 min   Charges:   PT Evaluation $PT Eval Low Complexity: 1 Procedure PT Treatments $Gait Training: 8-22 mins   PT G Codes:  Leighton Ruff, PT, DPT  Acute Rehabilitation Services  Pager: [phone]   Rudean Hitt 04/21/2017, 4:57 PM

## 2017-04-21 NOTE — Consult Note (Signed)
Reason for Consult:? Elbow infection Referring Physician: C Jazlin Tapscott is an 61 y.o. female.  HPI: Burnie suffered a fall about a month ago in [place] where she broke her left elbow and hip. She was planning to move to [place] the next day. Once she was in rehab (I think in a SNF) it became to expensive for the moving company to keep their things and they decided to come on down. She has had no rehab here for the last couple of weeks. She went to the hospital in [place] to see about her elbow as it was still quite painful and somewhat red. She notes that her pain is unchanged from surgery. She denies any drainage, fevers, chills, or sweats. Her hip has been doing well.  Past Medical History:  Diagnosis Date  . Cirrhosis of liver ([place])   . Depression   . Heart murmur   . Hypothyroidism     Past Surgical History:  Procedure Laterality Date  . FRACTURE SURGERY    . JOINT REPLACEMENT      Family History  Problem Relation Age of Onset  . Adopted: Yes    Social History:  reports that she has never smoked. She has never used smokeless tobacco. She reports that she does not drink alcohol or use drugs.  Allergies:  Allergies  Allergen Reactions  . Penicillins Swelling and Other (See Comments)    As a child  . Sulfa Antibiotics Itching and Rash    Medications: I have reviewed the patient's current medications.  Results for orders placed or performed during the hospital encounter of 04/20/17 (from the past 48 hour(s))  CBC with Differential/Platelet     Status: Abnormal   Collection Time: 04/21/17  1:53 AM  Result Value Ref Range   WBC 4.5 4.0 - 10.5 K/uL   RBC 3.52 (L) 3.87 - 5.11 MIL/uL   Hemoglobin 9.6 (L) 12.0 - 15.0 g/dL   HCT 32.1 (L) 36.0 - 46.0 %   MCV 91.2 78.0 - 100.0 fL   MCH 27.3 26.0 - 34.0 pg   MCHC 29.9 (L) 30.0 - 36.0 g/dL   RDW 18.3 (H) 11.5 - 15.5 %   Platelets 53 (L) 150 - 400 K/uL    Comment: REPEATED TO VERIFY SPECIMEN CHECKED FOR CLOTS PLATELET  COUNT CONFIRMED BY SMEAR    Neutrophils Relative % 64 %   Neutro Abs 2.8 1.7 - 7.7 K/uL   Lymphocytes Relative 19 %   Lymphs Abs 0.8 0.7 - 4.0 K/uL   Monocytes Relative 14 %   Monocytes Absolute 0.6 0.1 - 1.0 K/uL   Eosinophils Relative 3 %   Eosinophils Absolute 0.2 0.0 - 0.7 K/uL   Basophils Relative 0 %   Basophils Absolute 0.0 0.0 - 0.1 K/uL  Hepatic function panel     Status: Abnormal   Collection Time: 04/21/17  1:53 AM  Result Value Ref Range   Total Protein 5.7 (L) 6.5 - 8.1 g/dL   Albumin 2.6 (L) 3.5 - 5.0 g/dL   AST 45 (H) 15 - 41 U/L   ALT 15 14 - 54 U/L   Alkaline Phosphatase 193 (H) 38 - 126 U/L   Total Bilirubin 2.4 (H) 0.3 - 1.2 mg/dL   Bilirubin, Direct 0.9 (H) 0.1 - 0.5 mg/dL   Indirect Bilirubin 1.5 (H) 0.3 - 0.9 mg/dL  Basic metabolic panel     Status: Abnormal   Collection Time: 04/21/17  1:53 AM  Result Value Ref Range  Sodium 139 135 - 145 mmol/L   Potassium 3.4 (L) 3.5 - 5.1 mmol/L   Chloride 106 101 - 111 mmol/L   CO2 25 22 - 32 mmol/L   Glucose, Bld 114 (H) 65 - 99 mg/dL   BUN 5 (L) 6 - 20 mg/dL   Creatinine, Ser 0.80 0.44 - 1.00 mg/dL   Calcium 7.9 (L) 8.9 - 10.3 mg/dL   GFR calc non Af Amer >60 >60 mL/min   GFR calc Af Amer >60 >60 mL/min    Comment: (NOTE) The eGFR has been calculated using the CKD EPI equation. This calculation has not been validated in all clinical situations. eGFR's persistently <60 mL/min signify possible Chronic Kidney Disease.    Anion gap 8 5 - 15  Vitamin B12     Status: Abnormal   Collection Time: 04/21/17  8:05 AM  Result Value Ref Range   Vitamin B-12 1,038 (H) 180 - 914 pg/mL    Comment: (NOTE) This assay is not validated for testing neonatal or myeloproliferative syndrome specimens for Vitamin B12 levels.   Folate     Status: None   Collection Time: 04/21/17  8:05 AM  Result Value Ref Range   Folate 17.0 >5.9 ng/mL  Iron and TIBC     Status: None   Collection Time: 04/21/17  8:05 AM  Result Value Ref  Range   Iron 37 28 - 170 ug/dL   TIBC 302 250 - 450 ug/dL   Saturation Ratios 12 10.4 - 31.8 %   UIBC 265 ug/dL  Ferritin     Status: None   Collection Time: 04/21/17  8:05 AM  Result Value Ref Range   Ferritin 68 11 - 307 ng/mL  Reticulocytes     Status: Abnormal   Collection Time: 04/21/17  8:05 AM  Result Value Ref Range   Retic Ct Pct 3.6 (H) 0.4 - 3.1 %   RBC. 3.63 (L) 3.87 - 5.11 MIL/uL   Retic Count, Manual 130.7 19.0 - 186.0 K/uL    No results found.  Review of Systems  Constitutional: Negative for weight loss.  HENT: Negative for ear discharge, ear pain, hearing loss and tinnitus.   Eyes: Negative for blurred vision, double vision, photophobia and pain.  Respiratory: Negative for cough, sputum production and shortness of breath.   Cardiovascular: Negative for chest pain.  Gastrointestinal: Negative for abdominal pain, nausea and vomiting.  Genitourinary: Negative for dysuria, flank pain, frequency and urgency.  Musculoskeletal: Positive for joint pain (Left elbow > left hip). Negative for back pain, falls, myalgias and neck pain.  Neurological: Negative for dizziness, tingling, sensory change, focal weakness, loss of consciousness and headaches.  Endo/Heme/Allergies: Does not bruise/bleed easily.  Psychiatric/Behavioral: Negative for depression, memory loss and substance abuse. The patient is not nervous/anxious.    Blood pressure 116/89, pulse 63, temperature 98.6 F (37 C), temperature source Oral, resp. rate 16, SpO2 100 %. Physical Exam  Constitutional: She appears well-developed and well-nourished. No distress.  HENT:  Head: Normocephalic.  Eyes: Conjunctivae are normal. Right eye exhibits no discharge. Left eye exhibits no discharge. No scleral icterus.  Cardiovascular: Normal rate and regular rhythm.   Respiratory: Effort normal. No respiratory distress.  Musculoskeletal:  Left shoulder, elbow, wrist, digits- well-healed surgical incision elbow, mild TTP,  minimal erythema, no instability, no blocks to motion, ext 160, flex 45 degrees  Sens  Ax/R/M/U intact  Mot   Ax/ R/ PIN/ M/ AIN/ U intact  Rad 2+  LLE Well-healed surgical  incision, no ecchymosis or rash  Nontender  No effusions  Knee stable to varus/ valgus and anterior/posterior stress  Sens DPN, SPN, TN intact but paresthetic  Motor EHL, ext, flex, evers 5/5  DP 2+, PT 2+, No significant edema  Skin: She is not diaphoretic.    Assessment/Plan: S/p left elbow ORIF -- From her description it sounds like she broke her olecranon. X-rays obtained in [place] are not available for viewing. I have ordered some baseline films to help with longitudinal follow-up. I do not believe she has an infection and the mild erythema she does have is likely 2/2 skin reaction to her sutures. They will be removed today. Ok to stop abx (will d/c). She needs an outpatient therapy program to help regain her motion; we will get that set up for her or at least get her an order so she can go to a place closer to home. S/p left hip ORIF -- Likely antegrade IMN based on her incisions. Sutures to be removed today. Will again get baseline x-rays. She should see Dr. Stann Mainland in 2-4 weeks and should call for an appointment.  Pt ok to discharge from an orthopedic standpoint once her x-rays are complete.  UPDATE: Hip x-rays look ok, fine to f/u with Dr. Stann Mainland in 2-4 weeks. Elbow x-rays show olecranon fixation failure, will need to f/u with hand surgery to schedule revision. That should happen in the next week. Still ok to discharge, NWB LUE and should not begin PT/OT until directed by hand surgery.    Lisette Abu, PA-C Orthopedic Surgery [phone] 04/21/2017, 11:13 AM

## 2017-04-29 ENCOUNTER — Encounter (HOSPITAL_COMMUNITY): Payer: Self-pay | Admitting: General Practice

## 2017-04-29 MED ORDER — CLINDAMYCIN PHOSPHATE 900 MG/50ML IV SOLN
900.0000 mg | INTRAVENOUS | Status: AC
  Administered 2017-04-30: 900 mg via INTRAVENOUS
  Filled 2017-04-29: qty 50

## 2017-04-29 NOTE — H&P (Signed)
HPI: Megan Roberson suffered a fall about 5 weeks ago in [place] where she broke her left elbow and hip. She was planning to move to [place] the next day. Once she was in rehab (I think in a SNF) it became to expensive for the moving company to keep their things and they decided to come on down. She has had no rehab here. She went to the hospital in [place] to see about her elbow as it was still quite painful and somewhat red. She notes that her pain is unchanged from surgery. She denies any drainage, fevers, chills, or sweats. Her hip has been doing well.      Past Medical History:  Diagnosis Date  . Cirrhosis of liver ([place])   . Depression   . Heart murmur   . Hypothyroidism          Past Surgical History:  Procedure Laterality Date  . FRACTURE SURGERY    . JOINT REPLACEMENT      Family History  Problem Relation Age of Onset  . Adopted: Yes    Social History:  reports that she has never smoked. She has never used smokeless tobacco. She reports that she does not drink alcohol or use drugs.  Allergies:       Allergies  Allergen Reactions  . Penicillins Swelling and Other (See Comments)    As a child  . Sulfa Antibiotics Itching and Rash    Medications: I have reviewed the patient's current medications.  Lab Results Last 48 Hours        Results for orders placed or performed during the hospital encounter of 04/20/17 (from the past 48 hour(s))  CBC with Differential/Platelet     Status: Abnormal   Collection Time: 04/21/17  1:53 AM  Result Value Ref Range   WBC 4.5 4.0 - 10.5 K/uL   RBC 3.52 (L) 3.87 - 5.11 MIL/uL   Hemoglobin 9.6 (L) 12.0 - 15.0 g/dL   HCT 32.1 (L) 36.0 - 46.0 %   MCV 91.2 78.0 - 100.0 fL   MCH 27.3 26.0 - 34.0 pg   MCHC 29.9 (L) 30.0 - 36.0 g/dL   RDW 18.3 (H) 11.5 - 15.5 %   Platelets 53 (L) 150 - 400 K/uL    Comment: REPEATED TO VERIFY SPECIMEN CHECKED FOR CLOTS PLATELET COUNT CONFIRMED BY SMEAR    Neutrophils Relative %  64 %   Neutro Abs 2.8 1.7 - 7.7 K/uL   Lymphocytes Relative 19 %   Lymphs Abs 0.8 0.7 - 4.0 K/uL   Monocytes Relative 14 %   Monocytes Absolute 0.6 0.1 - 1.0 K/uL   Eosinophils Relative 3 %   Eosinophils Absolute 0.2 0.0 - 0.7 K/uL   Basophils Relative 0 %   Basophils Absolute 0.0 0.0 - 0.1 K/uL  Hepatic function panel     Status: Abnormal   Collection Time: 04/21/17  1:53 AM  Result Value Ref Range   Total Protein 5.7 (L) 6.5 - 8.1 g/dL   Albumin 2.6 (L) 3.5 - 5.0 g/dL   AST 45 (H) 15 - 41 U/L   ALT 15 14 - 54 U/L   Alkaline Phosphatase 193 (H) 38 - 126 U/L   Total Bilirubin 2.4 (H) 0.3 - 1.2 mg/dL   Bilirubin, Direct 0.9 (H) 0.1 - 0.5 mg/dL   Indirect Bilirubin 1.5 (H) 0.3 - 0.9 mg/dL  Basic metabolic panel     Status: Abnormal   Collection Time: 04/21/17  1:53 AM  Result Value Ref  Range   Sodium 139 135 - 145 mmol/L   Potassium 3.4 (L) 3.5 - 5.1 mmol/L   Chloride 106 101 - 111 mmol/L   CO2 25 22 - 32 mmol/L   Glucose, Bld 114 (H) 65 - 99 mg/dL   BUN 5 (L) 6 - 20 mg/dL   Creatinine, Ser 0.80 0.44 - 1.00 mg/dL   Calcium 7.9 (L) 8.9 - 10.3 mg/dL   GFR calc non Af Amer >60 >60 mL/min   GFR calc Af Amer >60 >60 mL/min    Comment: (NOTE) The eGFR has been calculated using the CKD EPI equation. This calculation has not been validated in all clinical situations. eGFR's persistently <60 mL/min signify possible Chronic Kidney Disease.    Anion gap 8 5 - 15  Vitamin B12     Status: Abnormal   Collection Time: 04/21/17  8:05 AM  Result Value Ref Range   Vitamin B-12 1,038 (H) 180 - 914 pg/mL    Comment: (NOTE) This assay is not validated for testing neonatal or myeloproliferative syndrome specimens for Vitamin B12 levels.   Folate     Status: None   Collection Time: 04/21/17  8:05 AM  Result Value Ref Range   Folate 17.0 >5.9 ng/mL  Iron and TIBC     Status: None   Collection Time: 04/21/17  8:05 AM  Result Value Ref Range    Iron 37 28 - 170 ug/dL   TIBC 302 250 - 450 ug/dL   Saturation Ratios 12 10.4 - 31.8 %   UIBC 265 ug/dL  Ferritin     Status: None   Collection Time: 04/21/17  8:05 AM  Result Value Ref Range   Ferritin 68 11 - 307 ng/mL  Reticulocytes     Status: Abnormal   Collection Time: 04/21/17  8:05 AM  Result Value Ref Range   Retic Ct Pct 3.6 (H) 0.4 - 3.1 %   RBC. 3.63 (L) 3.87 - 5.11 MIL/uL   Retic Count, Manual 130.7 19.0 - 186.0 K/uL      Imaging Results (Last 48 hours)  No results found.    Review of Systems  Constitutional: Negative for weight loss.  HENT: Negative for ear discharge, ear pain, hearing loss and tinnitus.   Eyes: Negative for blurred vision, double vision, photophobia and pain.  Respiratory: Negative for cough, sputum production and shortness of breath.   Cardiovascular: Negative for chest pain.  Gastrointestinal: Negative for abdominal pain, nausea and vomiting.  Genitourinary: Negative for dysuria, flank pain, frequency and urgency.  Musculoskeletal: Positive for joint pain (Left elbow > left hip). Negative for back pain, falls, myalgias and neck pain.  Neurological: Negative for dizziness, tingling, sensory change, focal weakness, loss of consciousness and headaches.  Endo/Heme/Allergies: Does not bruise/bleed easily.  Psychiatric/Behavioral: Negative for depression, memory loss and substance abuse. The patient is not nervous/anxious.    Blood pressure 116/89, pulse 63, temperature 98.6 F (37 C), temperature source Oral, resp. rate 16, SpO2 100 %. Physical Exam  Constitutional: She appears well-developed and well-nourished. No distress.  HENT:  Head: Normocephalic.  Eyes: Conjunctivae are normal. Right eye exhibits no discharge. Left eye exhibits no discharge. No scleral icterus.  Cardiovascular: Normal rate and regular rhythm.   Respiratory: Effort normal. No respiratory distress.  Musculoskeletal:  Left shoulder, elbow, wrist, digits-  well-healed surgical incision elbow, mild TTP, minimal erythema, no instability, no blocks to motion, ext 160, flex 45 degrees  Sens  Ax/R/M/U intact             Mot   Ax/ R/ PIN/ M/ AIN/ U intact             Rad 2+  LLE     Well-healed surgical incision, no ecchymosis or rash             Nontender             No effusions             Knee stable to varus/ valgus and anterior/posterior stress             Sens DPN, SPN, TN intact but paresthetic             Motor EHL, ext, flex, evers 5/5             DP 2+, PT 2+, No significant edema  Skin: She is not diaphoretic.  (Exam and ROS from consult 6/20)  Assessment/Plan: S/p left elbow ORIF -- Will undergo revision of ORIF tomorrow by Dr. Marcelino Scot. NPO after MN. We discussed plan on telephone.    Lisette Abu, PA-C Orthopedic Surgery [phone] 04/29/17

## 2017-04-29 NOTE — Progress Notes (Signed)
Patient aware to arrive at 0530 and will be NPO after midnight.   Patient denies shortness of breath and chest pain but did state she has been told she has a heart murmur.

## 2017-04-30 ENCOUNTER — Ambulatory Visit (HOSPITAL_COMMUNITY): Payer: Medicare Other

## 2017-04-30 ENCOUNTER — Ambulatory Visit (HOSPITAL_COMMUNITY)
Admission: RE | Admit: 2017-04-30 | Discharge: 2017-04-30 | Disposition: A | Discharge status: Home / Self Care | Payer: Medicare Other | Source: Ambulatory Visit | Attending: Orthopedic Surgery | Admitting: Orthopedic Surgery

## 2017-04-30 ENCOUNTER — Ambulatory Visit (HOSPITAL_COMMUNITY): Payer: Medicare Other | Admitting: Certified Registered Nurse Anesthetist

## 2017-04-30 ENCOUNTER — Encounter (HOSPITAL_COMMUNITY)
Admission: RE | Disposition: A | Discharge status: Home / Self Care | Payer: Self-pay | Source: Ambulatory Visit | Attending: Orthopedic Surgery

## 2017-04-30 ENCOUNTER — Encounter (HOSPITAL_COMMUNITY): Payer: Self-pay | Admitting: Orthopedic Surgery

## 2017-04-30 DIAGNOSIS — Z79899 Other long term (current) drug therapy: Secondary | ICD-10-CM | POA: Insufficient documentation

## 2017-04-30 DIAGNOSIS — Y998 Other external cause status: Secondary | ICD-10-CM | POA: Diagnosis not present

## 2017-04-30 DIAGNOSIS — S52022A Displaced fracture of olecranon process without intraarticular extension of left ulna, initial encounter for closed fracture: Secondary | ICD-10-CM | POA: Insufficient documentation

## 2017-04-30 DIAGNOSIS — Y93E1 Activity, personal bathing and showering: Secondary | ICD-10-CM | POA: Insufficient documentation

## 2017-04-30 DIAGNOSIS — Y92002 Bathroom of unspecified non-institutional (private) residence single-family (private) house as the place of occurrence of the external cause: Secondary | ICD-10-CM | POA: Insufficient documentation

## 2017-04-30 DIAGNOSIS — S46312A Strain of muscle, fascia and tendon of triceps, left arm, initial encounter: Secondary | ICD-10-CM | POA: Diagnosis not present

## 2017-04-30 DIAGNOSIS — F329 Major depressive disorder, single episode, unspecified: Secondary | ICD-10-CM | POA: Diagnosis not present

## 2017-04-30 DIAGNOSIS — W19XXXA Unspecified fall, initial encounter: Secondary | ICD-10-CM | POA: Insufficient documentation

## 2017-04-30 DIAGNOSIS — Z880 Allergy status to penicillin: Secondary | ICD-10-CM | POA: Insufficient documentation

## 2017-04-30 DIAGNOSIS — K7460 Unspecified cirrhosis of liver: Secondary | ICD-10-CM | POA: Insufficient documentation

## 2017-04-30 DIAGNOSIS — Z9889 Other specified postprocedural states: Secondary | ICD-10-CM

## 2017-04-30 DIAGNOSIS — E039 Hypothyroidism, unspecified: Secondary | ICD-10-CM | POA: Diagnosis not present

## 2017-04-30 DIAGNOSIS — R011 Cardiac murmur, unspecified: Secondary | ICD-10-CM | POA: Diagnosis not present

## 2017-04-30 DIAGNOSIS — Z882 Allergy status to sulfonamides status: Secondary | ICD-10-CM | POA: Diagnosis not present

## 2017-04-30 DIAGNOSIS — Z419 Encounter for procedure for purposes other than remedying health state, unspecified: Secondary | ICD-10-CM

## 2017-04-30 DIAGNOSIS — S52025A Nondisplaced fracture of olecranon process without intraarticular extension of left ulna, initial encounter for closed fracture: Secondary | ICD-10-CM | POA: Diagnosis present

## 2017-04-30 HISTORY — PX: ORIF ELBOW FRACTURE: SHX5031

## 2017-04-30 LAB — CBC
HCT: 36.1 % (ref 36.0–46.0)
Hemoglobin: 10.8 g/dL — ABNORMAL LOW (ref 12.0–15.0)
MCH: 26.9 pg (ref 26.0–34.0)
MCHC: 29.9 g/dL — ABNORMAL LOW (ref 30.0–36.0)
MCV: 90 fL (ref 78.0–100.0)
Platelets: 32 10*3/uL — ABNORMAL LOW (ref 150–400)
RBC: 4.01 MIL/uL (ref 3.87–5.11)
RDW: 18 % — ABNORMAL HIGH (ref 11.5–15.5)
WBC: 3.3 10*3/uL — ABNORMAL LOW (ref 4.0–10.5)

## 2017-04-30 LAB — TYPE AND SCREEN
ABO/RH(D): O POS
Antibody Screen: NEGATIVE

## 2017-04-30 LAB — ABO/RH: ABO/RH(D): O POS

## 2017-04-30 SURGERY — OPEN REDUCTION INTERNAL FIXATION (ORIF) ELBOW/OLECRANON FRACTURE
Anesthesia: General | Site: Elbow | Laterality: Left

## 2017-04-30 MED ORDER — ONDANSETRON HCL 4 MG/2ML IJ SOLN: 4.0000 mg | Freq: Once | INTRAMUSCULAR | Status: DC | PRN

## 2017-04-30 MED ORDER — SUGAMMADEX SODIUM 200 MG/2ML IV SOLN
INTRAVENOUS | Status: AC
  Filled 2017-04-30: qty 2

## 2017-04-30 MED ORDER — FENTANYL CITRATE (PF) 250 MCG/5ML IJ SOLN
INTRAMUSCULAR | Status: AC
  Filled 2017-04-30: qty 5

## 2017-04-30 MED ORDER — ONDANSETRON 4 MG PO TBDP: 4.0000 mg | ORAL_TABLET | Freq: Three times a day (TID) | ORAL | 0 refills | Status: DC | PRN

## 2017-04-30 MED ORDER — OXYCODONE HCL 5 MG PO TABS: 5.0000 mg | ORAL_TABLET | Freq: Three times a day (TID) | ORAL | 0 refills | Status: DC | PRN

## 2017-04-30 MED ORDER — PROPOFOL 10 MG/ML IV BOLUS
INTRAVENOUS | Status: DC | PRN
  Administered 2017-04-30: 20 mg via INTRAVENOUS
  Administered 2017-04-30: 50 mg via INTRAVENOUS
  Administered 2017-04-30: 80 mg via INTRAVENOUS

## 2017-04-30 MED ORDER — METHOCARBAMOL 500 MG PO TABS: 500.0000 mg | ORAL_TABLET | Freq: Four times a day (QID) | ORAL | 0 refills | Status: DC

## 2017-04-30 MED ORDER — ONDANSETRON HCL 4 MG/2ML IJ SOLN
INTRAMUSCULAR | Status: DC | PRN
  Administered 2017-04-30: 4 mg via INTRAVENOUS

## 2017-04-30 MED ORDER — LIDOCAINE 2% (20 MG/ML) 5 ML SYRINGE
INTRAMUSCULAR | Status: AC
  Filled 2017-04-30: qty 5

## 2017-04-30 MED ORDER — LACTATED RINGERS IV SOLN
INTRAVENOUS | Status: DC | PRN
  Administered 2017-04-30 (×2): via INTRAVENOUS

## 2017-04-30 MED ORDER — MIDAZOLAM HCL 2 MG/2ML IJ SOLN
INTRAMUSCULAR | Status: AC
  Filled 2017-04-30: qty 2

## 2017-04-30 MED ORDER — CHLORHEXIDINE GLUCONATE 4 % EX LIQD: 60.0000 mL | Freq: Once | CUTANEOUS | Status: DC

## 2017-04-30 MED ORDER — ONDANSETRON HCL 4 MG/2ML IJ SOLN
INTRAMUSCULAR | Status: AC
  Filled 2017-04-30: qty 2

## 2017-04-30 MED ORDER — LIDOCAINE HCL (CARDIAC) 20 MG/ML IV SOLN
INTRAVENOUS | Status: DC | PRN
  Administered 2017-04-30: 40 mg via INTRAVENOUS

## 2017-04-30 MED ORDER — SODIUM CHLORIDE 0.9 % IV SOLN: Freq: Once | INTRAVENOUS | Status: DC

## 2017-04-30 MED ORDER — PHENYLEPHRINE HCL 10 MG/ML IJ SOLN
INTRAVENOUS | Status: DC | PRN
  Administered 2017-04-30: 40 ug/min via INTRAVENOUS

## 2017-04-30 MED ORDER — PHENYLEPHRINE 40 MCG/ML (10ML) SYRINGE FOR IV PUSH (FOR BLOOD PRESSURE SUPPORT)
PREFILLED_SYRINGE | INTRAVENOUS | Status: AC
  Filled 2017-04-30: qty 10

## 2017-04-30 MED ORDER — 0.9 % SODIUM CHLORIDE (POUR BTL) OPTIME
TOPICAL | Status: DC | PRN
  Administered 2017-04-30: 1000 mL

## 2017-04-30 MED ORDER — SUGAMMADEX SODIUM 200 MG/2ML IV SOLN
INTRAVENOUS | Status: DC | PRN
  Administered 2017-04-30: 125 mg via INTRAVENOUS

## 2017-04-30 MED ORDER — FENTANYL CITRATE (PF) 100 MCG/2ML IJ SOLN
INTRAMUSCULAR | Status: DC | PRN
  Administered 2017-04-30: 25 ug via INTRAVENOUS
  Administered 2017-04-30: 50 ug via INTRAVENOUS

## 2017-04-30 MED ORDER — FENTANYL CITRATE (PF) 100 MCG/2ML IJ SOLN
25.0000 ug | INTRAMUSCULAR | Status: DC | PRN
  Administered 2017-04-30 (×2): 50 ug via INTRAVENOUS

## 2017-04-30 MED ORDER — TRAMADOL HCL 50 MG PO TABS
50.0000 mg | ORAL_TABLET | Freq: Four times a day (QID) | ORAL | 0 refills | Status: DC | PRN
Start: 2017-04-30 — End: 2017-09-17

## 2017-04-30 MED ORDER — DEXAMETHASONE SODIUM PHOSPHATE 10 MG/ML IJ SOLN
INTRAMUSCULAR | Status: DC | PRN
  Administered 2017-04-30: 5 mg via INTRAVENOUS

## 2017-04-30 MED ORDER — FENTANYL CITRATE (PF) 100 MCG/2ML IJ SOLN
INTRAMUSCULAR | Status: AC
  Filled 2017-04-30: qty 2

## 2017-04-30 MED ORDER — ROCURONIUM BROMIDE 10 MG/ML (PF) SYRINGE
PREFILLED_SYRINGE | INTRAVENOUS | Status: AC
  Filled 2017-04-30: qty 5

## 2017-04-30 MED ORDER — BUPIVACAINE HCL (PF) 0.25 % IJ SOLN
INTRAMUSCULAR | Status: AC
  Filled 2017-04-30: qty 30

## 2017-04-30 MED ORDER — ROCURONIUM BROMIDE 100 MG/10ML IV SOLN
INTRAVENOUS | Status: DC | PRN
  Administered 2017-04-30: 40 mg via INTRAVENOUS

## 2017-04-30 MED ORDER — DEXAMETHASONE SODIUM PHOSPHATE 10 MG/ML IJ SOLN
INTRAMUSCULAR | Status: AC
  Filled 2017-04-30: qty 1

## 2017-04-30 MED ORDER — PROPOFOL 10 MG/ML IV BOLUS
INTRAVENOUS | Status: AC
  Filled 2017-04-30: qty 40

## 2017-04-30 MED ORDER — PHENYLEPHRINE HCL 10 MG/ML IJ SOLN
INTRAMUSCULAR | Status: DC | PRN
  Administered 2017-04-30 (×2): 120 ug via INTRAVENOUS
  Administered 2017-04-30: 80 ug via INTRAVENOUS

## 2017-04-30 MED FILL — METHOCARBAMOL 500 MG TABLET: 500 | 8 days supply | Qty: 60 | Fill #0

## 2017-04-30 SURGICAL SUPPLY — 82 items
BANDAGE ACE 3X5.8 VEL STRL LF (GAUZE/BANDAGES/DRESSINGS) ×3 IMPLANT
BANDAGE ACE 4X5 VEL STRL LF (GAUZE/BANDAGES/DRESSINGS) ×3 IMPLANT
BANDAGE ACE 6X5 VEL STRL LF (GAUZE/BANDAGES/DRESSINGS) ×3 IMPLANT
BANDAGE ELASTIC 4 VELCRO ST LF (GAUZE/BANDAGES/DRESSINGS) ×3 IMPLANT
BENZOIN TINCTURE PRP APPL 2/3 (GAUZE/BANDAGES/DRESSINGS) IMPLANT
BLADE AVERAGE 25MMX9MM (BLADE)
BLADE AVERAGE 25X9 (BLADE) IMPLANT
BLADE CLIPPER SURG (BLADE) IMPLANT
BLADE SURG 10 STRL SS (BLADE) ×3 IMPLANT
BNDG COHESIVE 4X5 TAN STRL (GAUZE/BANDAGES/DRESSINGS) ×3 IMPLANT
BNDG ESMARK 4X9 LF (GAUZE/BANDAGES/DRESSINGS) IMPLANT
BNDG GAUZE ELAST 4 BULKY (GAUZE/BANDAGES/DRESSINGS) ×3 IMPLANT
BRUSH SCRUB SURG 4.25 DISP (MISCELLANEOUS) ×6 IMPLANT
CLEANER TIP ELECTROSURG 2X2 (MISCELLANEOUS) ×3 IMPLANT
CLOSURE WOUND 1/2 X4 (GAUZE/BANDAGES/DRESSINGS)
CORD BIPOLAR FORCEPS 12FT (ELECTRODE) ×3 IMPLANT
COVER MAYO STAND STRL (DRAPES) ×3 IMPLANT
COVER SURGICAL LIGHT HANDLE (MISCELLANEOUS) ×6 IMPLANT
CUFF TOURNIQUET SINGLE 18IN (TOURNIQUET CUFF) IMPLANT
CUFF TOURNIQUET SINGLE 24IN (TOURNIQUET CUFF) IMPLANT
DECANTER SPIKE VIAL GLASS SM (MISCELLANEOUS) IMPLANT
DRAIN PENROSE 1/4X12 LTX STRL (WOUND CARE) ×3 IMPLANT
DRAPE C-ARM 42X72 X-RAY (DRAPES) ×3 IMPLANT
DRAPE C-ARMOR (DRAPES) IMPLANT
DRAPE INCISE IOBAN 66X45 STRL (DRAPES) IMPLANT
DRAPE U-SHAPE 47X51 STRL (DRAPES) ×3 IMPLANT
DRSG ADAPTIC 3X8 NADH LF (GAUZE/BANDAGES/DRESSINGS) IMPLANT
DRSG EMULSION OIL 3X3 NADH (GAUZE/BANDAGES/DRESSINGS) IMPLANT
DRSG MEPITEL 4X7.2 (GAUZE/BANDAGES/DRESSINGS) ×3 IMPLANT
DRSG PAD ABDOMINAL 8X10 ST (GAUZE/BANDAGES/DRESSINGS) ×3 IMPLANT
ELECT REM PT RETURN 9FT ADLT (ELECTROSURGICAL) ×3
ELECTRODE REM PT RTRN 9FT ADLT (ELECTROSURGICAL) ×1 IMPLANT
FACESHIELD WRAPAROUND (MASK) IMPLANT
GAUZE SPONGE 4X4 12PLY STRL (GAUZE/BANDAGES/DRESSINGS) ×3 IMPLANT
GAUZE SPONGE 4X4 12PLY STRL LF (GAUZE/BANDAGES/DRESSINGS) ×3 IMPLANT
GAUZE XEROFORM 1X8 LF (GAUZE/BANDAGES/DRESSINGS) ×3 IMPLANT
GAUZE XEROFORM 5X9 LF (GAUZE/BANDAGES/DRESSINGS) ×3 IMPLANT
GLOVE BIO SURGEON STRL SZ7.5 (GLOVE) IMPLANT
GLOVE BIO SURGEON STRL SZ8 (GLOVE) ×3 IMPLANT
GLOVE BIOGEL PI IND STRL 7.0 (GLOVE) ×1 IMPLANT
GLOVE BIOGEL PI IND STRL 7.5 (GLOVE) IMPLANT
GLOVE BIOGEL PI IND STRL 8 (GLOVE) ×1 IMPLANT
GLOVE BIOGEL PI INDICATOR 7.0 (GLOVE) ×2
GLOVE BIOGEL PI INDICATOR 7.5 (GLOVE)
GLOVE BIOGEL PI INDICATOR 8 (GLOVE) ×2
GOWN STRL REUS W/ TWL LRG LVL3 (GOWN DISPOSABLE) ×2 IMPLANT
GOWN STRL REUS W/ TWL XL LVL3 (GOWN DISPOSABLE) ×1 IMPLANT
GOWN STRL REUS W/TWL LRG LVL3 (GOWN DISPOSABLE) ×4
GOWN STRL REUS W/TWL XL LVL3 (GOWN DISPOSABLE) ×2
KIT BASIN OR (CUSTOM PROCEDURE TRAY) ×3 IMPLANT
KIT ROOM TURNOVER OR (KITS) ×3 IMPLANT
MANIFOLD NEPTUNE II (INSTRUMENTS) IMPLANT
NS IRRIG 1000ML POUR BTL (IV SOLUTION) ×3 IMPLANT
PACK ORTHO EXTREMITY (CUSTOM PROCEDURE TRAY) ×3 IMPLANT
PAD ARMBOARD 7.5X6 YLW CONV (MISCELLANEOUS) ×6 IMPLANT
PAD CAST 4YDX4 CTTN HI CHSV (CAST SUPPLIES) ×1 IMPLANT
PADDING CAST COTTON 4X4 STRL (CAST SUPPLIES) ×2
PASSER SUT SWANSON 36MM LOOP (INSTRUMENTS) ×3 IMPLANT
SLING ARM FOAM STRAP MED (SOFTGOODS) ×3 IMPLANT
SPONGE LAP 18X18 X RAY DECT (DISPOSABLE) ×6 IMPLANT
STAPLER VISISTAT 35W (STAPLE) IMPLANT
STOCKINETTE IMPERVIOUS 9X36 MD (GAUZE/BANDAGES/DRESSINGS) ×3 IMPLANT
STRIP CLOSURE SKIN 1/2X4 (GAUZE/BANDAGES/DRESSINGS) IMPLANT
SUCTION FRAZIER HANDLE 10FR (MISCELLANEOUS)
SUCTION TUBE FRAZIER 10FR DISP (MISCELLANEOUS) IMPLANT
SUT ETHILON 3 0 PS 1 (SUTURE) ×6 IMPLANT
SUT PDS AB 2-0 CT1 27 (SUTURE) IMPLANT
SUT PROLENE 3 0 PS 2 (SUTURE) IMPLANT
SUT VIC AB 0 CT1 27 (SUTURE) ×6
SUT VIC AB 0 CT1 27XBRD ANBCTR (SUTURE) ×3 IMPLANT
SUT VIC AB 2-0 CT1 27 (SUTURE) ×2
SUT VIC AB 2-0 CT1 TAPERPNT 27 (SUTURE) ×1 IMPLANT
SUT VIC AB 2-0 CT3 27 (SUTURE) IMPLANT
SYR CONTROL 10ML LL (SYRINGE) IMPLANT
TAPE FIBER 2MM 7IN #2 BLUE (SUTURE) ×6 IMPLANT
TOWEL OR 17X24 6PK STRL BLUE (TOWEL DISPOSABLE) IMPLANT
TOWEL OR 17X26 10 PK STRL BLUE (TOWEL DISPOSABLE) ×6 IMPLANT
TUBE CONNECTING 12'X1/4 (SUCTIONS) ×1
TUBE CONNECTING 12X1/4 (SUCTIONS) ×2 IMPLANT
UNDERPAD 30X30 (UNDERPADS AND DIAPERS) ×3 IMPLANT
WATER STERILE IRR 1000ML POUR (IV SOLUTION) IMPLANT
YANKAUER SUCT BULB TIP NO VENT (SUCTIONS) ×3 IMPLANT

## 2017-04-30 NOTE — Transfer of Care (Signed)
Immediate Anesthesia Transfer of Care Note  Patient: Megan Roberson  Procedure(s) Performed: Procedure(s): REVISION OPEN REDUCTION INTERNAL FIXATION (ORIF) ELBOW/OLECRANON FRACTURE (Left)  Patient Location: PACU  Anesthesia Type:GA combined with regional for post-op pain  Level of Consciousness: awake, alert  and oriented  Airway & Oxygen Therapy: Patient Spontanous Breathing and Patient connected to nasal cannula oxygen  Post-op Assessment: Report given to RN and Post -op Vital signs reviewed and stable  Post vital signs: Reviewed and stable  Last Vitals:  Vitals:   04/30/17 0811 04/30/17 1045  BP:    Pulse: 68   Resp: 19   Temp:  (P) 36.3 C    Last Pain:  Vitals:   04/30/17 1045  TempSrc:   PainSc: (P) Asleep      Patients Stated Pain Goal: 3 (35/39/12 2583)  Complications: No apparent anesthesia complications

## 2017-04-30 NOTE — Anesthesia Procedure Notes (Signed)
Procedure Name: Intubation Date/Time: 04/30/2017 8:31 AM Performed by: Candis Shine Pre-anesthesia Checklist: Patient identified, Emergency Drugs available, Suction available and Patient being monitored Patient Re-evaluated:Patient Re-evaluated prior to inductionOxygen Delivery Method: Circle System Utilized Preoxygenation: Pre-oxygenation with 100% oxygen Intubation Type: IV induction Ventilation: Mask ventilation without difficulty Laryngoscope Size: Mac and 3 Grade View: Grade I Tube type: Oral Tube size: 7.0 mm Number of attempts: 1 Airway Equipment and Method: Stylet Placement Confirmation: ETT inserted through vocal cords under direct vision,  positive ETCO2 and breath sounds checked- equal and bilateral Secured at: 21 cm Tube secured with: Tape Dental Injury: Teeth and Oropharynx as per pre-operative assessment

## 2017-04-30 NOTE — Anesthesia Preprocedure Evaluation (Addendum)
Anesthesia Evaluation  Patient identified by MRN, date of birth, ID band Patient awake    Reviewed: Allergy & Precautions, NPO status , Patient's Chart, lab work & pertinent test results  Airway Mallampati: II  TM Distance: >3 FB Neck ROM: Full    Dental  (+) Dental Advisory Given, Upper Dentures   Pulmonary neg pulmonary ROS,    Pulmonary exam normal breath sounds clear to auscultation       Cardiovascular Exercise Tolerance: Good negative cardio ROS Normal cardiovascular exam+ Valvular Problems/Murmurs  Rhythm:Regular Rate:Normal     Neuro/Psych PSYCHIATRIC DISORDERS Depression negative neurological ROS     GI/Hepatic negative GI ROS, (+) Cirrhosis       ,   Endo/Other  Hypothyroidism   Renal/GU negative Renal ROS     Musculoskeletal negative musculoskeletal ROS (+)   Abdominal   Peds  Hematology  (+) Blood dyscrasia (thrombocytopenia), anemia ,   Anesthesia Other Findings Day of surgery medications reviewed with the patient.  Reproductive/Obstetrics                           Anesthesia Physical Anesthesia Plan  ASA: III  Anesthesia Plan: General   Post-op Pain Management:    Induction: Intravenous  PONV Risk Score and Plan:   Airway Management Planned: Oral ETT  Additional Equipment:   Intra-op Plan:   Post-operative Plan: Extubation in OR  Informed Consent: I have reviewed the patients History and Physical, chart, labs and discussed the procedure including the risks, benefits and alternatives for the proposed anesthesia with the patient or authorized representative who has indicated his/her understanding and acceptance.   Dental advisory given  Plan Discussed with: CRNA  Anesthesia Plan Comments: (Risks/benefits of general anesthesia discussed with patient including risk of damage to teeth, lips, gum, and tongue, nausea/vomiting, allergic reactions to medications,  and the possibility of heart attack, stroke and death.  All patient questions answered.  Patient wishes to proceed.)        Anesthesia Quick Evaluation

## 2017-04-30 NOTE — Anesthesia Procedure Notes (Signed)
Anesthesia Regional Block: Supraclavicular block   Pre-Anesthetic Checklist: ,, timeout performed, Correct Patient, Correct Site, Correct Laterality, Correct Procedure, Correct Position, site marked, Risks and benefits discussed,  Surgical consent,  Pre-op evaluation,  At surgeon's request and post-op pain management  Laterality: Left  Prep: chloraprep       Needles:  Injection technique: Single-shot  Needle Type: Echogenic Needle     Needle Length: 9cm  Needle Gauge: 21     Additional Needles:   Procedures: ultrasound guided,,,,,,,,  Narrative:  Start time: 04/30/2017 8:05 AM End time: 04/30/2017 8:10 AM Injection made incrementally with aspirations every 5 mL.  Performed by: Personally  Anesthesiologist: Catalina Gravel  Additional Notes: No pain on injection. No increased resistance to injection. Injection made in 5cc increments.  Good needle visualization.  Patient tolerated procedure well.

## 2017-04-30 NOTE — Op Note (Deleted)
  The note originally documented on this encounter has been moved the the encounter in which it belongs.  

## 2017-04-30 NOTE — Anesthesia Postprocedure Evaluation (Signed)
Anesthesia Post Note  Patient: Megan Roberson  Procedure(s) Performed: Procedure(s) (LRB): REVISION OPEN REDUCTION INTERNAL FIXATION (ORIF) ELBOW/OLECRANON FRACTURE (Left)     Patient location during evaluation: PACU Anesthesia Type: General Level of consciousness: awake Pain management: pain level controlled Vital Signs Assessment: post-procedure vital signs reviewed and stable Respiratory status: spontaneous breathing Cardiovascular status: stable Anesthetic complications: no    Last Vitals:  Vitals:   04/30/17 1245 04/30/17 1323  BP:  125/79  Pulse: 63 74  Resp: 16 15  Temp: 36.7 C 36.7 C    Last Pain:  Vitals:   04/30/17 1323  TempSrc:   PainSc: 0-No pain                 Lorenzo Arscott

## 2017-04-30 NOTE — Brief Op Note (Signed)
04/30/2017  11:08 AM  PATIENT:  Megan Roberson  61 y.o. female  PRE-OPERATIVE DIAGNOSIS:  LOSS OF REDUCTION LEFT OLECRANON FRACTURE  POST-OPERATIVE DIAGNOSIS: LOSS OF REDUCTION LEFT OLECRANON FRACTURE  PROCEDURE:  Procedure(s): REVISION OPEN REDUCTION INTERNAL FIXATION (ORIF) ELBOW/OLECRANON FRACTURE (Left)  SURGEON:  Surgeon(s) and Role:    Marcelino Scot, Legrand Como, MD - Primary  ASSISTANTS: none   ANESTHESIA:   general and interscalene block left  EBL:  Total I/O In: 1000 [I.V.:1000] Out: 50 [Blood:50]  BLOOD ADMINISTERED:none  DRAINS: none   LOCAL MEDICATIONS USED:  NONE  SPECIMEN:  Source of Specimen:  ethibond suture  DISPOSITION OF SPECIMEN:  micro  COUNTS:  YES  TOURNIQUET:  * No tourniquets in log *  DICTATION: .Other Dictation: Dictation Number [id number]  PLAN OF CARE: Discharge to home after PACU  PATIENT DISPOSITION:  PACU - hemodynamically stable.   Delay start of Pharmacological VTE agent (>24hrs) due to surgical blood loss or risk of bleeding: not applicable

## 2017-04-30 NOTE — Discharge Instructions (Signed)
Orthopaedic Trauma Service Discharge Instructions   General Discharge Instructions  WEIGHT BEARING STATUS: Nonweightbearing Left upper extremity    RANGE OF MOTION/ACTIVITY: no active extension L elbow. Do not push up with left arm to rise from chair or bed, etc  Wound Care: daily wound care starting on 05/02/2017. See below   Discharge Wound Care Instructions  Do NOT apply any ointments, solutions or lotions to pin sites or surgical wounds.  These prevent needed drainage and even though solutions like hydrogen peroxide kill bacteria, they also damage cells lining the pin sites that help fight infection.  Applying lotions or ointments can keep the wounds moist and can cause them to breakdown and open up as well. This can increase the risk for infection. When in doubt call the office.  Surgical incisions should be dressed daily.  If any drainage is noted, use one layer of adaptic, then gauze, Kerlix, and an ace wrap.  Once the incision is completely dry and without drainage, it may be left open to air out.  Showering may begin 36-48 hours later.  Cleaning gently with soap and water.  Traumatic wounds should be dressed daily as well.    One layer of adaptic, gauze, Kerlix, then ace wrap.  The adaptic can be discontinued once the draining has ceased    If you have a wet to dry dressing: wet the gauze with saline the squeeze as much saline out so the gauze is moist (not soaking wet), place moistened gauze over wound, then place a dry gauze over the moist one, followed by Kerlix wrap, then ace wrap.  PAIN MEDICATION USE AND EXPECTATIONS  You have likely been given narcotic medications to help control your pain.  After a traumatic event that results in an fracture (broken bone) with or without surgery, it is ok to use narcotic pain medications to help control one's pain.  We understand that everyone responds to pain differently and each individual patient will be evaluated on a regular basis for  the continued need for narcotic medications. Ideally, narcotic medication use should last no more than 6-8 weeks (coinciding with fracture healing).   As a patient it is your responsibility as well to monitor narcotic medication use and report the amount and frequency you use these medications when you come to your office visit.   We would also advise that if you are using narcotic medications, you should take a dose prior to therapy to maximize you participation.  IF YOU ARE ON NARCOTIC MEDICATIONS IT IS NOT PERMISSIBLE TO OPERATE A MOTOR VEHICLE (MOTORCYCLE/CAR/TRUCK/MOPED) OR HEAVY MACHINERY DO NOT MIX NARCOTICS WITH OTHER CNS (CENTRAL NERVOUS SYSTEM) DEPRESSANTS SUCH AS ALCOHOL  Diet: as you were eating previously.  Can use over the counter stool softeners and bowel preparations, such as Miralax, to help with bowel movements.  Narcotics can be constipating.  Be sure to drink plenty of fluids    STOP SMOKING OR USING NICOTINE PRODUCTS!!!!  As discussed nicotine severely impairs your body's ability to heal surgical and traumatic wounds but also impairs bone healing.  Wounds and bone heal by forming microscopic blood vessels (angiogenesis) and nicotine is a vasoconstrictor (essentially, shrinks blood vessels).  Therefore, if vasoconstriction occurs to these microscopic blood vessels they essentially disappear and are unable to deliver necessary nutrients to the healing tissue.  This is one modifiable factor that you can do to dramatically increase your chances of healing your injury.    (This means no smoking, no nicotine gum, patches,  etc)  DO NOT USE NONSTEROIDAL ANTI-INFLAMMATORY DRUGS (NSAID'S)  Using products such as Advil (ibuprofen), Aleve (naproxen), Motrin (ibuprofen) for additional pain control during fracture healing can delay and/or prevent the healing response.  If you would like to take over the counter (OTC) medication, Tylenol (acetaminophen) is ok.  However, some narcotic  medications that are given for pain control contain acetaminophen as well. Therefore, you should not exceed more than 4000 mg of tylenol in a day if you do not have liver disease.  Also note that there are may OTC medicines, such as cold medicines and allergy medicines that my contain tylenol as well.  If you have any questions about medications and/or interactions please ask your doctor/PA or your pharmacist.      ICE AND ELEVATE INJURED/OPERATIVE EXTREMITY  Using ice and elevating the injured extremity above your heart can help with swelling and pain control.  Icing in a pulsatile fashion, such as 20 minutes on and 20 minutes off, can be followed.    Do not place ice directly on skin. Make sure there is a barrier between to skin and the ice pack.    Using frozen items such as frozen peas works well as the conform nicely to the are that needs to be iced.  USE AN ACE WRAP OR TED HOSE FOR SWELLING CONTROL  In addition to icing and elevation, Ace wraps or TED hose are used to help limit and resolve swelling.  It is recommended to use Ace wraps or TED hose until you are informed to stop.    When using Ace Wraps start the wrapping distally (farthest away from the body) and wrap proximally (closer to the body)   Example: If you had surgery on your leg or thing and you do not have a splint on, start the ace wrap at the toes and work your way up to the thigh        If you had surgery on your upper extremity and do not have a splint on, start the ace wrap at your fingers and work your way up to the upper arm  IF YOU ARE IN A SPLINT OR CAST DO NOT [place]   If your splint gets wet for any reason please contact the office immediately. You may shower in your splint or cast as long as you keep it dry.  This can be done by wrapping in a cast cover or garbage back (or similar)  Do Not stick any thing down your splint or cast such as pencils, money, or hangers to try and scratch yourself with.  If  you feel itchy take benadryl as prescribed on the bottle for itching  IF YOU ARE IN A CAM BOOT (BLACK BOOT)  You may remove boot periodically. Perform daily dressing changes as noted below.  Wash the liner of the boot regularly and wear a sock when wearing the boot. It is recommended that you sleep in the boot until told otherwise  CALL THE OFFICE WITH ANY QUESTIONS OR CONCERNS: [phone]

## 2017-04-30 NOTE — Op Note (Signed)
Megan Roberson, Megan Roberson NO.:  [id number]  MEDICAL RECORD NO.:  [id number]  LOCATION:                               FACILITY:  [facility]  PHYSICIAN:  Astrid Divine. Marcelino Scot, M.D. DATE OF BIRTH:  [birth year]  DATE OF PROCEDURE:  04/30/2017 DATE OF DISCHARGE:                              OPERATIVE REPORT   PREOPERATIVE DIAGNOSIS:  Loss of reduction, left olecranon fracture.  POSTOPERATIVE DIAGNOSIS:  Loss of reduction, left olecranon fracture.  PROCEDURE:  Revision open reduction and internal fixation of left olecranon with repair of triceps tendon disruption.  SURGEON:  Astrid Divine. Marcelino Scot, M.D.  ASSISTANT:  None.  ANESTHESIA:  General and regional with interscalene block.  I/O:  1000 mL crystalloid, out 50 mL EBL.  DRAINS:  None.  SPECIMEN:  One Ethibond suture sent to Micro for culture.  DISPOSITION:  To PACU.  CONDITION:  Hemodynamically stable.  BRIEF SUMMARY OF INDICATION FOR PROCEDURE:  Megan Roberson is a 61 year old female with severe cirrhosis of unknown etiology, who sustained both left olecranon fracture and a left hip fracture approximately 4 weeks ago in [place].  She experienced acute fracture around her hardware with loss of reduction of the olecranon using her arms to push up or break her fall when attempting to go to the bathroom.  She has had limited elbow function since that time.  I was asked to see and consult on the patient because of the technical difficulty of restoring extensor function of the triceps with the other surgeons believing this would benefit from the involvement of a fellowship trained orthopedic traumatologist.  I did discuss with her the risks and benefits of repair including the potential for failure of reduction with pullout of the fixation once more, may decrease range of motion which would be certain and of varying degree as well as infection and bleeding, particularly given her cirrhosis.  We also discussed other  anesthetic complications. She and her husband did wish to proceed.  BRIEF SUMMARY OF PROCEDURE:  The patient was taken to the operating room and general anesthesia was induced.  She did receive clindamycin preoperatively.  She was left supine and the left lower extremity was prepped and draped in usual sterile fashion.  No tourniquet was used during the procedure.  The old incision was remade, but extended quite a bit proximally.  After a time-out was held, the paratenon was developed and mobilized.  This revealed an Ethibond suture placed using correct Krackow technique, however, was disrupted distally where it appeared to have migrated through the bone and abraded against the screws until rupturing.  The articular cartilage appeared healthy on the trochlear spool.  This joint was irrigated thoroughly.  I did perform a neurolysis of the ulnar nerve down to the cubital tunnel.  The hardware remained well fixed distally.  Using FiberWire tape, a Krackow stitch was placed into the lateral and medial halves, bringing the suture down through hole used for tying within the plate that had smooth edges and tying over the metal of the plate, such that we were not reliant on the bone to hold fixation at all nor could the suture directly abut the screws and abrade  in this manner.  Once I woven the #2 FiberTape sutures up through the tendon and brought them back down to the plate and the elbow was brought into extension and the fragment and into the triceps, mobilized into the space where it was secured with the help of assistant.  It should be noted that there was a coronal split in the olecranon fragment, so that it contained an articular segment with some capsule and a dorsal segment with some of the fibers of the triceps being intact to this.  Consequently, it was impossible to gain an anatomic reduction.  Wounds were irrigated thoroughly.  C-arm was used to confirm appropriate reduction.   Again, there may be some slight migration of the olecranon articular segment proximally, but clinical motion was outstanding on the table with 20 degrees to over 100 degrees with no migration of the fragments.  There should not be any restriction after healing to flexion, but it may be that extension beyond 20 is not feasible.  0 Vicryl, 2-0 Vicryl, and 3-0 nylon were used for closure. Sterile bulky dressing was applied and then a sling.  The patient was awakened from anesthesia and transported to the PACU in stable condition.  PROGNOSIS:  Megan Roberson really needs a hepatologist or gastroenterologist here in [place], she has just relocated from [place] where she had Medicaid coverage.  The coverage issue makes this difficult for her to obtain new care.  She is to contact me immediately with any musculoskeletal concerns or questions.  I have encouraged her husband to not delay any trip to the ER should this be required if she should have other complication related to her liver function which is quite fragile. She will not require DVT prophylaxis.  Fortunately, in spite of her thrombocytopenia, she did not have any significant bleeding or evidence of bleeding during surgery.  She remains at risk for complications related to this repair given her bone fragility including loss of reduction, delayed union, malunion, or nonunion.  I will see her back in 2 weeks for removal of sutures and again should there be any concerns.     Astrid Divine. Marcelino Scot, M.D.   ______________________________ Astrid Divine. Marcelino Scot, M.D.    MHH/MEDQ  D:  04/30/2017  T:  04/30/2017  Job:  [id number]

## 2017-05-03 ENCOUNTER — Encounter (HOSPITAL_COMMUNITY): Payer: Self-pay | Admitting: Orthopedic Surgery

## 2017-05-04 ENCOUNTER — Emergency Department (HOSPITAL_COMMUNITY)
Admission: EM | Admit: 2017-05-04 | Discharge: 2017-05-04 | Disposition: A | Discharge status: Home / Self Care | Payer: Medicare Other | Attending: Emergency Medicine | Admitting: Emergency Medicine

## 2017-05-04 ENCOUNTER — Encounter (HOSPITAL_COMMUNITY): Payer: Self-pay | Admitting: Emergency Medicine

## 2017-05-04 DIAGNOSIS — Z79899 Other long term (current) drug therapy: Secondary | ICD-10-CM | POA: Diagnosis not present

## 2017-05-04 DIAGNOSIS — R011 Cardiac murmur, unspecified: Secondary | ICD-10-CM | POA: Insufficient documentation

## 2017-05-04 DIAGNOSIS — S5002XA Contusion of left elbow, initial encounter: Secondary | ICD-10-CM | POA: Diagnosis not present

## 2017-05-04 DIAGNOSIS — Y939 Activity, unspecified: Secondary | ICD-10-CM | POA: Insufficient documentation

## 2017-05-04 DIAGNOSIS — Y929 Unspecified place or not applicable: Secondary | ICD-10-CM | POA: Insufficient documentation

## 2017-05-04 DIAGNOSIS — W19XXXA Unspecified fall, initial encounter: Secondary | ICD-10-CM | POA: Diagnosis not present

## 2017-05-04 DIAGNOSIS — Y998 Other external cause status: Secondary | ICD-10-CM | POA: Diagnosis not present

## 2017-05-04 DIAGNOSIS — E039 Hypothyroidism, unspecified: Secondary | ICD-10-CM | POA: Diagnosis not present

## 2017-05-04 DIAGNOSIS — G8918 Other acute postprocedural pain: Secondary | ICD-10-CM | POA: Insufficient documentation

## 2017-05-04 DIAGNOSIS — S59902A Unspecified injury of left elbow, initial encounter: Secondary | ICD-10-CM | POA: Diagnosis present

## 2017-05-04 NOTE — ED Triage Notes (Signed)
Pt. Stated, I had surgery on my left elbow last Friday from a fall and yesterday I fell and hurt the same elbow. I want to make sure I didn't re injure the elbow.

## 2017-05-04 NOTE — ED Provider Notes (Signed)
[facility] DEPT Provider Note   CSN: [id number] Arrival date & time: 05/04/17  1456  By signing my name below, I, Margit Banda, attest that this documentation has been prepared under the direction and in the presence of [employer] . Electronically Signed: Margit Banda, ED Scribe. 05/04/17. 5:14 PM.  History   Chief Complaint Chief Complaint  Patient presents with  . Fall  . Joint Swelling  . Post-op Problem    HPI Megan Roberson is a 61 y.o. female who presents to the Emergency Department complaining of left elbow pain s/p a fall that occurred yesterday, 05/03/17. Pt reports falling ~ 3 weeks ago and broke her hip and elbow. She had surgery on 04/20/17 and needed repeat surgery (04/30/17) on her elbow s/p complications. Pt wanted to make sure she didn't re-injure her elbow as her pain has worsened since falling yesterday. She also notes increased swelling and discoloration. Pt denies fever and chills.  The history is provided by the patient. No language interpreter was used.    Past Medical History:  Diagnosis Date  . Cirrhosis of liver ([place])   . Depression   . Heart murmur   . Hypothyroidism     Patient Active Problem List   Diagnosis Date Noted  . Cirrhosis of liver ([place]) 04/21/2017  . Anemia 04/21/2017  . History of open reduction and internal fixation (ORIF) procedure 04/21/2017  . Visit for suture removal 04/21/2017  . Left Olecranon fracture 04/21/2017  . Hypokalemia 04/21/2017    Past Surgical History:  Procedure Laterality Date  . FRACTURE SURGERY    . JOINT REPLACEMENT    . ORIF ELBOW FRACTURE Left 04/30/2017   Procedure: REVISION OPEN REDUCTION INTERNAL FIXATION (ORIF) ELBOW/OLECRANON FRACTURE;  Surgeon: Altamese Wendell, MD;  Location: [facility];  Service: Orthopedics;  Laterality: Left;    OB History    No data available       Home Medications    Prior to Admission medications   Medication Sig Start Date End Date Taking? Authorizing Provider    gabapentin (NEURONTIN) 300 MG capsule Take 300 mg by mouth 2 (two) times daily.    [provider]  levothyroxine (SYNTHROID, LEVOTHROID) 50 MCG tablet Take 50 mcg by mouth daily.     [provider]  methocarbamol (ROBAXIN) 500 MG tablet Take 1-2 tablets (500-1,000 mg total) by mouth 4 (four) times daily. 04/30/17   Ainsley Spinner, PA-C  ondansetron (ZOFRAN ODT) 4 MG disintegrating tablet Take 1-2 tablets (4-8 mg total) by mouth every 8 (eight) hours as needed for nausea or vomiting. 04/30/17   Ainsley Spinner, PA-C  oxyCODONE (ROXICODONE) 5 MG immediate release tablet Take 1-2 tablets (5-10 mg total) by mouth every 8 (eight) hours as needed for moderate pain or severe pain. 04/30/17   Ainsley Spinner, PA-C  QUEtiapine (SEROQUEL) 25 MG tablet Take 25 mg by mouth at bedtime. Take 25 to 50 mg daily at bedtime for mood    [provider]  rifaximin (XIFAXAN) 550 MG TABS tablet Take 550 mg by mouth every evening.     [provider]  sertraline (ZOLOFT) 25 MG tablet Take 25 mg by mouth at bedtime.    [provider]  traMADol (ULTRAM) 50 MG tablet Take 1 tablet (50 mg total) by mouth every 6 (six) hours as needed for moderate pain. 04/30/17 04/30/18  Ainsley Spinner, PA-C    Family History Family History  Problem Relation Age of Onset  . Adopted: Yes    Social History  Social History  Substance Use Topics  . Smoking status: Never Smoker  . Smokeless tobacco: Never Used  . Alcohol use No     Allergies   Penicillins and Sulfa antibiotics   Review of Systems Review of Systems  Constitutional: Negative for chills and fever.  Musculoskeletal: Positive for arthralgias and joint swelling.  All other systems reviewed and are negative.    Physical Exam Updated Vital Signs BP 127/80   Pulse 63   Temp 98.6 F (37 C) (Oral)   Resp 16   Ht 5' 7"  (1.702 m)   Wt 145 lb (65.8 kg)   SpO2 97%   BMI 22.71 kg/m   Physical Exam  Constitutional: She is oriented to  person, place, and time. She appears well-developed and well-nourished.  HENT:  Head: Normocephalic.  Eyes: EOM are normal.  Neck: Normal range of motion.  Cardiovascular: Normal rate, regular rhythm, normal heart sounds and intact distal pulses.   No tachycardia.  Pulmonary/Chest: Effort normal and breath sounds normal.  Abdominal: She exhibits no distension.  Musculoskeletal: She exhibits tenderness.  2+ radial pulses. Capillary refill < 2 seconds. Bruising to the ulnar aspect of mid forearm. Surgical sutures in place. No red streaks. Tenderness at the surgical sight. No palpable nodes of the bicep - tricep area. No humerus or forearm deformity.  Neurological: She is alert and oriented to person, place, and time.  Psychiatric: She has a normal mood and affect.  Nursing note and vitals reviewed.    ED Treatments / Results  DIAGNOSTIC STUDIES: Oxygen Saturation is 97% on RA, normal by my interpretation.   COORDINATION OF CARE: 5:14 PM-Discussed next steps with pt. Pt verbalized understanding and is agreeable with the plan.   Labs (all labs ordered are listed, but only abnormal results are displayed) Labs Reviewed - No data to display  EKG  EKG Interpretation None       Radiology No results found.  Procedures Procedures (including critical care time)  Medications Ordered in ED Medications - No data to display   Initial Impression / Assessment and Plan / ED Course  I have reviewed the triage vital signs and the nursing notes.  Pertinent labs & imaging results that were available during my care of the patient were reviewed by me and considered in my medical decision making (see chart for details).       MDM  5:14 PM Pt has had two operations on the left elbow because of fractures. Pt had a second fall yesterday and has been having increased pain of the elbow. No sign of major infections. No neurovascular deficits. The surgeon has contacted pt while in the ED and  advised them to come to the office in two days. Vital signs are within normal limits. Pt will be discharge home with close orthopaedic follow up. Family in agreement with plan.  Final Clinical Impressions(s) / ED Diagnoses   Final diagnoses:  Contusion of left elbow, initial encounter  Post-operative pain    New Prescriptions New Prescriptions   No medications on file   **I personally performed the services described in this documentation, which was scribed in my presence. The recorded information has been reviewed and is accurate.    Lily Kocher, PA-C 05/04/17 1843    Drenda Freeze, MD 05/06/17 [phone]

## 2017-05-04 NOTE — Discharge Instructions (Signed)
Your vital signs are within normal limits. There is no evidence of major infection involving your elbow/surgical site. I suspect you have a contusion to an already tender elbow area. Please see your surgeon as scheduled. Please return to the emergency department if any high fever, red streaks going up the arm, pus like drainage from the surgical site, or changes in your general condition.

## 2017-05-05 LAB — AEROBIC/ANAEROBIC CULTURE W GRAM STAIN (SURGICAL/DEEP WOUND): Culture: NO GROWTH

## 2017-06-01 ENCOUNTER — Ambulatory Visit: Payer: MEDICAID | Attending: Physical Therapy | Admitting: Physical Therapy

## 2017-06-04 DIAGNOSIS — R296 Repeated falls: Secondary | ICD-10-CM | POA: Insufficient documentation

## 2017-06-04 DIAGNOSIS — K769 Liver disease, unspecified: Secondary | ICD-10-CM | POA: Insufficient documentation

## 2017-06-04 DIAGNOSIS — G990 Autonomic neuropathy in diseases classified elsewhere: Secondary | ICD-10-CM | POA: Insufficient documentation

## 2017-06-27 ENCOUNTER — Emergency Department (HOSPITAL_COMMUNITY)
Admission: EM | Admit: 2017-06-27 | Discharge: 2017-06-27 | Disposition: A | Discharge status: Home / Self Care | Payer: Medicare Other | Attending: Emergency Medicine | Admitting: Emergency Medicine

## 2017-06-27 ENCOUNTER — Encounter (HOSPITAL_COMMUNITY): Payer: Self-pay

## 2017-06-27 DIAGNOSIS — R140 Abdominal distension (gaseous): Secondary | ICD-10-CM | POA: Insufficient documentation

## 2017-06-27 DIAGNOSIS — E039 Hypothyroidism, unspecified: Secondary | ICD-10-CM | POA: Diagnosis not present

## 2017-06-27 DIAGNOSIS — Z79899 Other long term (current) drug therapy: Secondary | ICD-10-CM | POA: Diagnosis not present

## 2017-06-27 DIAGNOSIS — M25552 Pain in left hip: Secondary | ICD-10-CM | POA: Diagnosis not present

## 2017-06-27 DIAGNOSIS — M25522 Pain in left elbow: Secondary | ICD-10-CM | POA: Insufficient documentation

## 2017-06-27 DIAGNOSIS — G894 Chronic pain syndrome: Secondary | ICD-10-CM | POA: Insufficient documentation

## 2017-06-27 DIAGNOSIS — K7460 Unspecified cirrhosis of liver: Secondary | ICD-10-CM | POA: Diagnosis not present

## 2017-06-27 HISTORY — DX: Liver disease, unspecified: K76.9

## 2017-06-27 MED ORDER — HYDROMORPHONE HCL 1 MG/ML IJ SOLN
1.0000 mg | Freq: Once | INTRAMUSCULAR | Status: AC
  Administered 2017-06-27: 1 mg via INTRAMUSCULAR
  Filled 2017-06-27: qty 1

## 2017-06-27 NOTE — ED Provider Notes (Signed)
[facility] DEPT Provider Note   CSN: [id number] Arrival date & time: 06/27/17  0209     History   Chief Complaint Chief Complaint  Patient presents with  . Leg Injury  . Arm Pain    HPI Megan Roberson is a 61 y.o. female.  Patient presents to the emergency department with chief complaint of chronic left elbow and left hip pain. She states that she typically takes Tylenol for the pain, but reports that she has been having worsening cirrhosis, and now has to have therapeutic paracenteses. She states that she needs something other than Tylenol to take for her pain. She denies any new injuries or trauma. She denies any fevers or chills. Her symptoms are worsened with movement. There is nothing new about her pain symptoms tonight.   The history is provided by the patient. No language interpreter was used.    Past Medical History:  Diagnosis Date  . Cirrhosis of liver ([place])   . Depression   . Heart murmur   . Hypothyroidism   . Liver disease 2008    Patient Active Problem List   Diagnosis Date Noted  . Cirrhosis of liver ([place]) 04/21/2017  . Anemia 04/21/2017  . History of open reduction and internal fixation (ORIF) procedure 04/21/2017  . Visit for suture removal 04/21/2017  . Left Olecranon fracture 04/21/2017  . Hypokalemia 04/21/2017    Past Surgical History:  Procedure Laterality Date  . FRACTURE SURGERY    . JOINT REPLACEMENT    . ORIF ELBOW FRACTURE Left 04/30/2017   Procedure: REVISION OPEN REDUCTION INTERNAL FIXATION (ORIF) ELBOW/OLECRANON FRACTURE;  Surgeon: Altamese Channelview, MD;  Location: [facility];  Service: Orthopedics;  Laterality: Left;    OB History    No data available       Home Medications    Prior to Admission medications   Medication Sig Start Date End Date Taking? Authorizing Provider  gabapentin (NEURONTIN) 300 MG capsule Take 300 mg by mouth 2 (two) times daily.    [provider]  levothyroxine (SYNTHROID, LEVOTHROID) 50 MCG  tablet Take 50 mcg by mouth daily.     [provider]  methocarbamol (ROBAXIN) 500 MG tablet Take 1-2 tablets (500-1,000 mg total) by mouth 4 (four) times daily. 04/30/17   Ainsley Spinner, PA-C  ondansetron (ZOFRAN ODT) 4 MG disintegrating tablet Take 1-2 tablets (4-8 mg total) by mouth every 8 (eight) hours as needed for nausea or vomiting. 04/30/17   Ainsley Spinner, PA-C  oxyCODONE (ROXICODONE) 5 MG immediate release tablet Take 1-2 tablets (5-10 mg total) by mouth every 8 (eight) hours as needed for moderate pain or severe pain. 04/30/17   Ainsley Spinner, PA-C  QUEtiapine (SEROQUEL) 25 MG tablet Take 25 mg by mouth at bedtime. Take 25 to 50 mg daily at bedtime for mood    [provider]  rifaximin (XIFAXAN) 550 MG TABS tablet Take 550 mg by mouth every evening.     [provider]  sertraline (ZOLOFT) 25 MG tablet Take 25 mg by mouth at bedtime.    [provider]  traMADol (ULTRAM) 50 MG tablet Take 1 tablet (50 mg total) by mouth every 6 (six) hours as needed for moderate pain. 04/30/17 04/30/18  Ainsley Spinner, PA-C    Family History Family History  Problem Relation Age of Onset  . Adopted: Yes    Social History Social History  Substance Use Topics  . Smoking status: Never Smoker  . Smokeless tobacco: Never Used  . Alcohol  use No     Allergies   Penicillins and Sulfa antibiotics   Review of Systems Review of Systems  All other systems reviewed and are negative.    Physical Exam Updated Vital Signs BP (!) 151/93 (BP Location: Right Arm)   Pulse 81   Temp 98.2 F (36.8 C) (Oral)   Resp 18   Ht 5' 5"  (1.651 m)   Wt 63.5 kg (140 lb)   SpO2 99%   BMI 23.30 kg/m   Physical Exam  Constitutional: She is oriented to person, place, and time. She appears well-developed and well-nourished.  HENT:  Head: Normocephalic and atraumatic.  Eyes: Pupils are equal, round, and reactive to light. Conjunctivae and EOM are normal.  Neck: Normal range of motion.  Neck supple.  Cardiovascular: Normal rate and regular rhythm.  Exam reveals no gallop and no friction rub.   No murmur heard. Pulmonary/Chest: Effort normal and breath sounds normal. No respiratory distress. She has no wheezes. She has no rales. She exhibits no tenderness.  Abdominal: Soft. Bowel sounds are normal. She exhibits distension. She exhibits no mass. There is no tenderness. There is no rebound and no guarding.  Musculoskeletal: Normal range of motion. She exhibits no edema or tenderness.  ROM and strength of left elbow limited at end points  Neurological: She is alert and oriented to person, place, and time.  Skin: Skin is warm and dry.  No erythema, cellulitis, or evidence of abscess  Psychiatric: She has a normal mood and affect. Her behavior is normal. Judgment and thought content normal.  Nursing note and vitals reviewed.    ED Treatments / Results  Labs (all labs ordered are listed, but only abnormal results are displayed) Labs Reviewed - No data to display  EKG  EKG Interpretation None       Radiology No results found.  Procedures Procedures (including critical care time)  Medications Ordered in ED Medications  HYDROmorphone (DILAUDID) injection 1 mg (not administered)     Initial Impression / Assessment and Plan / ED Course  I have reviewed the triage vital signs and the nursing notes.  Pertinent labs & imaging results that were available during my care of the patient were reviewed by me and considered in my medical decision making (see chart for details).    Patient with chronic pain.  No new symptoms.  Requesting pain medication without Tylenol.  Will give patient a dose of pain medicine here, but have informed the patient that I cannot treat the chronic pain and she will need to see a specialist.  She is agreeable with this plan.  Final Clinical Impressions(s) / ED Diagnoses   Final diagnoses:  Chronic pain syndrome    New Prescriptions New  Prescriptions   No medications on file     Montine Circle, PA-C 06/27/17 0503    Ward, Delice Bison, DO 06/27/17 [phone]

## 2017-06-27 NOTE — ED Triage Notes (Addendum)
Pt. Reports pain in left elbow and hip. Pt. Reports having surgery approximately 3 months ago. Pt. Reports taking tylenol for pain. Pt. Also reports liver disease. Pt. Made aware of the effects of tylenol.

## 2017-07-01 ENCOUNTER — Ambulatory Visit: Payer: Medicare Other | Attending: Orthopedic Surgery | Admitting: Physical Therapy

## 2017-07-01 DIAGNOSIS — M25522 Pain in left elbow: Secondary | ICD-10-CM

## 2017-07-01 DIAGNOSIS — M25552 Pain in left hip: Secondary | ICD-10-CM | POA: Diagnosis present

## 2017-07-01 DIAGNOSIS — M6281 Muscle weakness (generalized): Secondary | ICD-10-CM | POA: Diagnosis present

## 2017-07-01 DIAGNOSIS — M25622 Stiffness of left elbow, not elsewhere classified: Secondary | ICD-10-CM | POA: Diagnosis present

## 2017-07-01 DIAGNOSIS — R2681 Unsteadiness on feet: Secondary | ICD-10-CM | POA: Diagnosis present

## 2017-07-01 NOTE — Therapy (Signed)
[facility] [place], [place], [address] Phone: [phone]   Fax:  [phone]  Physical Therapy Evaluation  Patient Details  Name: Megan Roberson MRN: [id number] Date of Birth: Mar 14, 1956 Referring Provider: Legrand Como handy MD.  Encounter Date: 07/01/2017      PT End of Session - 07/01/17 1237    Visit Number 1   Number of Visits 16   Date for PT Re-Evaluation 08/30/17   PT Start Time 3536   PT Stop Time 1218   PT Time Calculation (min) 54 min   Activity Tolerance Patient tolerated treatment well   Behavior During Therapy [facility] for tasks assessed/performed      Past Medical History:  Diagnosis Date  . Cirrhosis of liver ([place])   . Depression   . Heart murmur   . Hypothyroidism   . Liver disease 2008    Past Surgical History:  Procedure Laterality Date  . FRACTURE SURGERY    . JOINT REPLACEMENT    . ORIF ELBOW FRACTURE Left 04/30/2017   Procedure: REVISION OPEN REDUCTION INTERNAL FIXATION (ORIF) ELBOW/OLECRANON FRACTURE;  Surgeon: Altamese Rock Hall, MD;  Location: [facility];  Service: Orthopedics;  Laterality: Left;    There were no vitals filed for this visit.       Subjective Assessment - 07/01/17 1240    Subjective The patient fell on 03/26/17 just priot to moving from [place] to [place].  She sustained a left hip fracture and underwent an ORIF and surgery to her left elbow for an Olecranon fracture.  Upon moving to [place] she had to undergo a left elbow revision surgery.  Patient states a 3rd surgery was talked about but has not been performed.  Her pain-level in both regions is an 8/10.  She reports pain radiates into her left groin region.  Movement increases pain and rest decreases pain.   Limitations Walking   How long can you walk comfortably? Short distances.   Patient Stated Goals Reduce pain and be able to use left arm.   Currently in Pain? Yes   Pain Score 8    Pain Location Elbow   Pain Orientation Left   Pain  Descriptors / Indicators Aching;Throbbing   Pain Type Acute pain   Pain Onset More than a month ago   Pain Frequency Constant   Aggravating Factors  See above.   Pain Relieving Factors See above.   Multiple Pain Sites Yes   Pain Score 8   Pain Location Hip   Pain Orientation Left   Pain Descriptors / Indicators Aching;Throbbing;Tender   Pain Type Acute pain   Pain Onset More than a month ago   Pain Frequency Constant   Aggravating Factors  See above.   Pain Relieving Factors See above.            [facility] PT Assessment - 07/01/17 0001      Assessment   Medical Diagnosis ORIF left Olecranon; left hip surgery.   Referring Provider Legrand Como handy MD.   Onset Date/Surgical Date --  03/26/17.     Precautions   Precautions Fall  OP.  No LT elbow extension against resistance.   Precaution Comments Please be with patient's at all times for safety.     Restrictions   Weight Bearing Restrictions No     Balance Screen   Has the patient fallen in the past 6 months Yes   How many times? --  3.   Has the patient had a decrease in activity level  because of a fear of falling?  Yes   Is the patient reluctant to leave their home because of a fear of falling?  Yes     [place] residence     Prior Function   Level of Independence Independent     Observation/Other Assessments   Observations Left hip and left elbow incisional sites are well healed.     Observation/Other Assessments-Edema    Edema --  No significant edema noted.     Posture/Postural Control   Posture/Postural Control Postural limitations   Postural Limitations Rounded Shoulders;Forward head;Decreased lumbar lordosis   Posture Comments Left elbow held in flexion.     ROM / Strength   AROM / PROM / Strength AROM;Strength     AROM   Overall AROM Comments Left elbow extension -40 degrees, full left elbow flexion.  WFL for left hip flexion.       Strength   Overall Strength  Comments Left hip abduction= 3- to 3/5.     Palpation   Palpation comment Patient very tender to even light palpation over and around her left greater trochanter region and diffuse pain is reported around her left elbow.     Special Tests    Special Tests --  (+) Romberg test.     Ambulation/Gait   Gait Comments Slow and purposeful gait with some unsteadiness observed.  She demonstrates a decrease in step and stride length.  Highly recommended she use a cane at all times.            Objective measurements completed on examination: See above findings.          [facility] Adult PT Treatment/Exercise - 07/01/17 0001      Modalities   Modalities Electrical Stimulation     Electrical Stimulation   Electrical Stimulation Location Left lateral hip.   Electrical Stimulation Action IFC   Electrical Stimulation Parameters 80-150 Hz x 15 minutes.   Electrical Stimulation Goals Pain                     PT Long Term Goals - 07/01/17 1326      PT LONG TERM GOAL #1   Title Independent with a HEP.   Time 8   Period Weeks   Status New     PT LONG TERM GOAL #2   Title left active elbow extension to -10 degrees.   Time 8   Period Weeks   Status New     PT LONG TERM GOAL #3   Title Negative Romberg test.   Time 8   Period Weeks   Status New     PT LONG TERM GOAL #4   Title Increase left hip strength to 4+/5 to increase stability for functional activites.   Time 8   Period Weeks   Status New     PT LONG TERM GOAL #5   Title Perform ADL's with pain not > 3/10.   Time 8   Period Weeks   Status New                Plan - 07/01/17 1320    Clinical Impression Statement The patient presents to OPPT with s/p left elbow and left hip surgery.  She is very limited into left elbow extension and she is still in a lot of pain.  Her left hip is weak as well.  She demonstrates a positive Romberg test. The patient will benefit from skilled  physical therapy to  address deficits.   Clinical Presentation Stable   Clinical Decision Making Low   Rehab Potential Good   PT Frequency 2x / week   PT Duration 8 weeks   PT Treatment/Interventions ADLs/Self Care Home Management;Cryotherapy;Electrical Stimulation;Moist Heat;Patient/family education;Therapeutic exercise;Neuromuscular re-education;Therapeutic activities;Manual techniques;Passive range of motion   PT Next Visit Plan Left elbow range of motion; left hip strengthening and balance activites.  Modalites to decrease pain.   Consulted and Agree with Plan of Care Patient      Patient will benefit from skilled therapeutic intervention in order to improve the following deficits and impairments:  Abnormal gait, Decreased activity tolerance, Decreased mobility, Decreased range of motion, Decreased strength, Pain  Visit Diagnosis: Pain in left elbow - Plan: PT plan of care cert/re-cert  Stiffness of left elbow, not elsewhere classified - Plan: PT plan of care cert/re-cert  Pain in left hip - Plan: PT plan of care cert/re-cert  Muscle weakness (generalized) - Plan: PT plan of care cert/re-cert  Unsteadiness on feet - Plan: PT plan of care cert/re-cert      G-Codes - 63/33/54 1239    Functional Assessment Tool Used (Outpatient Only) FOTO...64% limitation.   Functional Limitation Mobility: Walking and moving around   Mobility: Walking and Moving Around Current Status [phone]) At least 60 percent but less than 80 percent impaired, limited or restricted   Mobility: Walking and Moving Around Goal Status [phone]) At least 20 percent but less than 40 percent impaired, limited or restricted       Problem List Patient Active Problem List   Diagnosis Date Noted  . Cirrhosis of liver ([place]) 04/21/2017  . Anemia 04/21/2017  . History of open reduction and internal fixation (ORIF) procedure 04/21/2017  . Visit for suture removal 04/21/2017  . Left Olecranon fracture 04/21/2017  . Hypokalemia 04/21/2017     Jhoselin Crume, [place] MPT 07/01/2017, 1:32 PM  [facility] [address] [place], [place], [address] Phone: [phone]   Fax:  [phone]  Name: Megan Roberson MRN: [id number] Date of Birth: [birth year]

## 2017-07-06 ENCOUNTER — Encounter: Payer: Self-pay | Admitting: Physical Therapy

## 2017-07-06 ENCOUNTER — Ambulatory Visit: Payer: Medicare Other | Attending: Orthopedic Surgery | Admitting: Physical Therapy

## 2017-07-06 DIAGNOSIS — M25522 Pain in left elbow: Secondary | ICD-10-CM | POA: Insufficient documentation

## 2017-07-06 DIAGNOSIS — M25552 Pain in left hip: Secondary | ICD-10-CM | POA: Diagnosis present

## 2017-07-06 DIAGNOSIS — M6281 Muscle weakness (generalized): Secondary | ICD-10-CM | POA: Diagnosis present

## 2017-07-06 DIAGNOSIS — M25622 Stiffness of left elbow, not elsewhere classified: Secondary | ICD-10-CM

## 2017-07-06 DIAGNOSIS — R2681 Unsteadiness on feet: Secondary | ICD-10-CM | POA: Diagnosis present

## 2017-07-06 NOTE — Therapy (Signed)
[facility] [place], [place], [address] Phone: [phone]   Fax:  [phone]  Physical Therapy Treatment  Patient Details  Name: Megan Roberson MRN: [id number] Date of Birth: [birth year] Referring Provider: Legrand Como handy MD.  Encounter Date: 07/06/2017      PT End of Session - 07/06/17 1438    Visit Number 2   Number of Visits 16   Date for PT Re-Evaluation 08/30/17   PT Start Time 5638   PT Stop Time 1520   PT Time Calculation (min) 47 min   Activity Tolerance Patient tolerated treatment well   Behavior During Therapy [facility] for tasks assessed/performed      Past Medical History:  Diagnosis Date  . Cirrhosis of liver ([place])   . Depression   . Heart murmur   . Hypothyroidism   . Liver disease 2008    Past Surgical History:  Procedure Laterality Date  . FRACTURE SURGERY    . JOINT REPLACEMENT    . ORIF ELBOW FRACTURE Left 04/30/2017   Procedure: REVISION OPEN REDUCTION INTERNAL FIXATION (ORIF) ELBOW/OLECRANON FRACTURE;  Surgeon: Altamese Hector, MD;  Location: [facility];  Service: Orthopedics;  Laterality: Left;    There were no vitals filed for this visit.      Subjective Assessment - 07/06/17 1433    Subjective Reports overall stiffness upon arrival today. Reports that she mostly has pain at night. Reports that she can straighten elbow fairly straight but has a difficult time carrying anything in L hand.   Limitations Walking   How long can you walk comfortably? Short distances.   Patient Stated Goals Reduce pain and be able to use left arm.   Currently in Pain? Yes   Pain Score 3    Pain Location Hip  and elbow   Pain Orientation Left   Pain Type Acute pain   Pain Onset More than a month ago            [facility] PT Assessment - 07/06/17 0001      Assessment   Medical Diagnosis ORIF left Olecranon; left hip surgery.   Onset Date/Surgical Date 03/26/17   Next MD Visit TBD with Dr. Marcelino Scot      Precautions   Precautions Fall   Precaution Comments Please be with patient's at all times for safety.     Restrictions   Weight Bearing Restrictions No                     [facility] Adult PT Treatment/Exercise - 07/06/17 0001      Exercises   Exercises Knee/Hip;Elbow     Knee/Hip Exercises: Aerobic   Nustep L3, seat 10 x16 min     Knee/Hip Exercises: Standing   Hip Flexion AROM;Left;20 reps;Knee bent   Hip Abduction AROM;Left;20 reps;Knee straight   Hip Extension AROM;Left;20 reps;Knee straight     Knee/Hip Exercises: Seated   Long Arc Quad Strengthening;Left;2 sets;10 reps;Weights   Long Arc Quad Weight 3 lbs.     Knee/Hip Exercises: Supine   Bridges Strengthening;1 set;10 reps   Straight Leg Raises AROM;Left;2 sets;10 reps     Modalities   Modalities Social worker Location L lateral hip   Electrical Stimulation Action Pre-Mod   Electrical Stimulation Parameters 80-150 hz x15 min   Electrical Stimulation Goals Pain  PT Long Term Goals - 07/01/17 1326      PT LONG TERM GOAL #1   Title Independent with a HEP.   Time 8   Period Weeks   Status New     PT LONG TERM GOAL #2   Title left active elbow extension to -10 degrees.   Time 8   Period Weeks   Status New     PT LONG TERM GOAL #3   Title Negative Romberg test.   Time 8   Period Weeks   Status New     PT LONG TERM GOAL #4   Title Increase left hip strength to 4+/5 to increase stability for functional activites.   Time 8   Period Weeks   Status New     PT LONG TERM GOAL #5   Title Perform ADL's with pain not > 3/10.   Time 8   Period Weeks   Status New               Plan - 07/06/17 1506    Clinical Impression Statement Patient presented in clinic with low level L hip pain but still having increased L elbow pain even at rest. Patient guided through initial AROM L hip strengthening with  reports of RLE discomfort for standing exercises. Weakness and fatigue presented with supine exercises such as SLR. Normal modality response noted following removal of the modality. L elbow exercises avoided at this time secondary to patient's report of constant pain.   Rehab Potential Good   PT Frequency 2x / week   PT Duration 8 weeks   PT Treatment/Interventions ADLs/Self Care Home Management;Cryotherapy;Electrical Stimulation;Moist Heat;Patient/family education;Therapeutic exercise;Neuromuscular re-education;Therapeutic activities;Manual techniques;Passive range of motion   PT Next Visit Plan Left elbow range of motion; left hip strengthening and balance activites.  Modalites to decrease pain.   Consulted and Agree with Plan of Care Patient      Patient will benefit from skilled therapeutic intervention in order to improve the following deficits and impairments:  Abnormal gait, Decreased activity tolerance, Decreased mobility, Decreased range of motion, Decreased strength, Pain  Visit Diagnosis: Pain in left elbow  Stiffness of left elbow, not elsewhere classified  Pain in left hip  Muscle weakness (generalized)  Unsteadiness on feet     Problem List Patient Active Problem List   Diagnosis Date Noted  . Cirrhosis of liver ([place]) 04/21/2017  . Anemia 04/21/2017  . History of open reduction and internal fixation (ORIF) procedure 04/21/2017  . Visit for suture removal 04/21/2017  . Left Olecranon fracture 04/21/2017  . Hypokalemia 04/21/2017    Wynelle Fanny, PTA 07/06/2017, 3:34 PM  [facility] [address] [place], [place], [address] Phone: [phone]   Fax:  [phone]  Name: Megan Roberson MRN: [id number] Date of Birth: 1956-05-01

## 2017-07-08 ENCOUNTER — Ambulatory Visit: Payer: Medicare Other | Admitting: Physical Therapy

## 2017-07-08 ENCOUNTER — Encounter: Payer: Self-pay | Admitting: Physical Therapy

## 2017-07-08 DIAGNOSIS — M25522 Pain in left elbow: Secondary | ICD-10-CM | POA: Diagnosis not present

## 2017-07-08 DIAGNOSIS — M25552 Pain in left hip: Secondary | ICD-10-CM

## 2017-07-08 DIAGNOSIS — R2681 Unsteadiness on feet: Secondary | ICD-10-CM

## 2017-07-08 DIAGNOSIS — M25622 Stiffness of left elbow, not elsewhere classified: Secondary | ICD-10-CM

## 2017-07-08 DIAGNOSIS — M6281 Muscle weakness (generalized): Secondary | ICD-10-CM

## 2017-07-08 NOTE — Therapy (Signed)
[facility] [place], [place], [address] Phone: [phone]   Fax:  [phone]  Physical Therapy Treatment  Patient Details  Name: Megan Roberson MRN: [id number] Date of Birth: 10-Mar-1956 Referring Provider: Legrand Como handy MD.  Encounter Date: 07/08/2017      PT End of Session - 07/08/17 1433    Visit Number 3   Number of Visits 16   Date for PT Re-Evaluation 08/30/17   PT Start Time 6333   PT Stop Time 1522   PT Time Calculation (min) 50 min   Activity Tolerance Patient tolerated treatment well   Behavior During Therapy [facility] for tasks assessed/performed      Past Medical History:  Diagnosis Date  . Cirrhosis of liver ([place])   . Depression   . Heart murmur   . Hypothyroidism   . Liver disease 2008    Past Surgical History:  Procedure Laterality Date  . FRACTURE SURGERY    . JOINT REPLACEMENT    . ORIF ELBOW FRACTURE Left 04/30/2017   Procedure: REVISION OPEN REDUCTION INTERNAL FIXATION (ORIF) ELBOW/OLECRANON FRACTURE;  Surgeon: Altamese Morganfield, MD;  Location: [place];  Service: Orthopedics;  Laterality: Left;    There were no vitals filed for this visit.      Subjective Assessment - 07/08/17 1432    Subjective Reports that the soreness following previous treatment "wasn't too bad."   Limitations Walking   How long can you walk comfortably? Short distances.   Patient Stated Goals Reduce pain and be able to use left arm.   Currently in Pain? Yes   Pain Score 5    Pain Location Hip   Pain Orientation Left   Pain Descriptors / Indicators Sore   Pain Type Acute pain   Pain Onset More than a month ago            [facility] PT Assessment - 07/08/17 0001      Assessment   Medical Diagnosis ORIF left Olecranon; left hip surgery.   Onset Date/Surgical Date 03/26/17   Next MD Visit TBD with Dr. Marcelino Scot     Precautions   Precautions Fall   Precaution Comments Please be with patient's at all times for safety.      Restrictions   Weight Bearing Restrictions No                     [facility] Adult PT Treatment/Exercise - 07/08/17 0001      Knee/Hip Exercises: Aerobic   Nustep L3, seat 9 x15 min     Knee/Hip Exercises: Standing   Hip Flexion AROM;Both;2 sets;10 reps;Knee bent   Hip Abduction AROM;Both;2 sets;10 reps;Knee straight   Hip Extension AROM;Both;2 sets;10 reps;Knee straight     Knee/Hip Exercises: Supine   Short Arc Quad Sets Strengthening;Left;2 sets;10 reps   Short Arc Quad Sets Limitations 3#   Straight Leg Raises AROM;Left;2 sets;10 reps     Modalities   Modalities Social worker Location L lateral hip   Electrical Stimulation Action Pre-Mod   Electrical Stimulation Parameters 80-150 hz x15 min   Electrical Stimulation Goals Pain             Balance Exercises - 07/08/17 1507      Balance Exercises: Standing   Standing Eyes Opened Narrow base of support (BOS);Foam/compliant surface  x2 min   Tandem Stance Eyes open;Foam/compliant surface  x3 min  PT Long Term Goals - 07/01/17 1326      PT LONG TERM GOAL #1   Title Independent with a HEP.   Time 8   Period Weeks   Status New     PT LONG TERM GOAL #2   Title left active elbow extension to -10 degrees.   Time 8   Period Weeks   Status New     PT LONG TERM GOAL #3   Title Negative Romberg test.   Time 8   Period Weeks   Status New     PT LONG TERM GOAL #4   Title Increase left hip strength to 4+/5 to increase stability for functional activites.   Time 8   Period Weeks   Status New     PT LONG TERM GOAL #5   Title Perform ADL's with pain not > 3/10.   Time 8   Period Weeks   Status New               Plan - 07/08/17 1511    Clinical Impression Statement Patient presented in clinic with mid level L hip discomfort today but no complaints of any increased pain with any exercises. B AROM hip  exercises completed to strengthen the L hip as well as improve hip stabiltiy when exercises completed along contralateral side. Low level uneven surface balance training completed today with intermittant to no UE support with appropriate ankle strategies noted. Patient educated regarding the need to retrain joint proprioceptors. Patient able to demonstrate proper stair gait without abnormal compensatory strategies. Weakness and fatigue continued to be noted in supine exercises which was proceeded by the standing exercises. Normal modalities response noted following removal of the modalities.   Rehab Potential Good   PT Frequency 2x / week   PT Duration 8 weeks   PT Treatment/Interventions ADLs/Self Care Home Management;Cryotherapy;Electrical Stimulation;Moist Heat;Patient/family education;Therapeutic exercise;Neuromuscular re-education;Therapeutic activities;Manual techniques;Passive range of motion   PT Next Visit Plan Left elbow range of motion; left hip strengthening and balance activites.  Modalites to decrease pain.   Consulted and Agree with Plan of Care Patient      Patient will benefit from skilled therapeutic intervention in order to improve the following deficits and impairments:  Abnormal gait, Decreased activity tolerance, Decreased mobility, Decreased range of motion, Decreased strength, Pain  Visit Diagnosis: Pain in left elbow  Stiffness of left elbow, not elsewhere classified  Pain in left hip  Muscle weakness (generalized)  Unsteadiness on feet     Problem List Patient Active Problem List   Diagnosis Date Noted  . Cirrhosis of liver ([place]) 04/21/2017  . Anemia 04/21/2017  . History of open reduction and internal fixation (ORIF) procedure 04/21/2017  . Visit for suture removal 04/21/2017  . Left Olecranon fracture 04/21/2017  . Hypokalemia 04/21/2017    Wynelle Fanny, PTA 07/08/2017, 3:25 PM  [facility] [address] [place], [place], [address] Phone: [phone]   Fax:  [phone]  Name: Megan Roberson MRN: [id number] Date of Birth: 1956-01-14

## 2017-07-13 ENCOUNTER — Encounter: Payer: Medicare Other | Admitting: Physical Therapy

## 2017-07-15 ENCOUNTER — Ambulatory Visit: Payer: Medicare Other | Admitting: *Deleted

## 2017-07-15 DIAGNOSIS — M25552 Pain in left hip: Secondary | ICD-10-CM

## 2017-07-15 DIAGNOSIS — M25522 Pain in left elbow: Secondary | ICD-10-CM

## 2017-07-15 DIAGNOSIS — R2681 Unsteadiness on feet: Secondary | ICD-10-CM

## 2017-07-15 DIAGNOSIS — M6281 Muscle weakness (generalized): Secondary | ICD-10-CM

## 2017-07-15 DIAGNOSIS — M25622 Stiffness of left elbow, not elsewhere classified: Secondary | ICD-10-CM

## 2017-07-15 NOTE — Therapy (Addendum)
[facility] [place], [place], [address] Phone: [phone]   Fax:  [phone]  Physical Therapy Treatment  Patient Details  Name: Megan Roberson MRN: [id number] Date of Birth: 1955-12-09 Referring Provider: Legrand Como handy MD.  Encounter Date: 07/15/2017      PT End of Session - 07/15/17 1447    Visit Number 4   Number of Visits 16   Date for PT Re-Evaluation 08/30/17   PT Start Time 1430   PT Stop Time 1521   PT Time Calculation (min) 51 min      Past Medical History:  Diagnosis Date  . Cirrhosis of liver ([place])   . Depression   . Heart murmur   . Hypothyroidism   . Liver disease 2008    Past Surgical History:  Procedure Laterality Date  . FRACTURE SURGERY    . JOINT REPLACEMENT    . ORIF ELBOW FRACTURE Left 04/30/2017   Procedure: REVISION OPEN REDUCTION INTERNAL FIXATION (ORIF) ELBOW/OLECRANON FRACTURE;  Surgeon: Altamese Lincoln, MD;  Location: [place];  Service: Orthopedics;  Laterality: Left;    There were no vitals filed for this visit.      Subjective Assessment - 07/15/17 1435    Subjective Very sore/stiff after lastr Rx. I couldn't hardly get up from dinner that night   Limitations Walking   How long can you walk comfortably? Short distances.   Patient Stated Goals Reduce pain and be able to use left arm.   Currently in Pain? Yes   Pain Score 3    Pain Location Hip   Pain Orientation Left   Pain Descriptors / Indicators Sore   Pain Type Acute pain   Pain Onset More than a month ago   Pain Frequency Constant                         [facility] Adult PT Treatment/Exercise - 07/15/17 0001      Exercises   Exercises Knee/Hip;Elbow     Elbow Exercises   Elbow Flexion AROM;Left;20 reps   Elbow Extension AROM;Left;20 reps   Forearm Supination AROM;20 reps   Forearm Pronation AROM;20 reps     Knee/Hip Exercises: Aerobic   Nustep L3, seat 10 x15 min, LT handle at 11, RT 10     Knee/Hip Exercises: Standing   Heel Raises 20 reps   Hip Flexion AROM;Both;2 sets;10 reps;Knee bent   Hip Abduction AROM;Both;2 sets;10 reps;Knee straight   Rocker Board 5 minutes  balance     Knee/Hip Exercises: Seated   Long Arc Quad --   Long Arc [employer] --     Modalities   Modalities Electrical Stimulation;Moist Heat     Moist Heat Therapy   Number Minutes Moist Heat 15 Minutes   Moist Heat Location Hip     Electrical Stimulation   Electrical Stimulation Location L lateral hip premod 80-_0  x15 mins   Electrical Stimulation Goals Pain                     PT Long Term Goals - 07/01/17 1326      PT LONG TERM GOAL #1   Title Independent with a HEP.   Time 8   Period Weeks   Status New     PT LONG TERM GOAL #2   Title left active elbow extension to -10 degrees.   Time 8   Period Weeks   Status New     PT  LONG TERM GOAL #3   Title Negative Romberg test.   Time 8   Period Weeks   Status New     PT LONG TERM GOAL #4   Title Increase left hip strength to 4+/5 to increase stability for functional activites.   Time 8   Period Weeks   Status New     PT LONG TERM GOAL #5   Title Perform ADL's with pain not > 3/10.   Time 8   Period Weeks   Status New               Plan - 07/15/17 1448    Clinical Impression Statement Pt arrived to clinic today after MD f/U and N.O to cont. with PT for  hip and elbow with no restrictions. See scanned MD note. She did well with AROM exs for LT elbow and LT LE. Pt is still somewhat guarded with using LT UE due to fear of damaging her elbow. Mainly fatigue end of Rx and normal response to heat/ Estim   Clinical Presentation Stable   Clinical Decision Making Low   Rehab Potential Good   PT Frequency 2x / week   PT Duration 8 weeks   PT Treatment/Interventions ADLs/Self Care Home Management;Cryotherapy;Electrical Stimulation;Moist Heat;Patient/family education;Therapeutic exercise;Neuromuscular  re-education;Therapeutic activities;Manual techniques;Passive range of motion   PT Next Visit Plan Left elbow range of motion; left hip strengthening and balance activites.  Modalites to decrease pain.   Consulted and Agree with Plan of Care Patient      Patient will benefit from skilled therapeutic intervention in order to improve the following deficits and impairments:  Abnormal gait, Decreased activity tolerance, Decreased mobility, Decreased range of motion, Decreased strength, Pain  Visit Diagnosis: Pain in left elbow  Stiffness of left elbow, not elsewhere classified  Pain in left hip  Muscle weakness (generalized)  Unsteadiness on feet     Problem List Patient Active Problem List   Diagnosis Date Noted  . Cirrhosis of liver ([place]) 04/21/2017  . Anemia 04/21/2017  . History of open reduction and internal fixation (ORIF) procedure 04/21/2017  . Visit for suture removal 04/21/2017  . Left Olecranon fracture 04/21/2017  . Hypokalemia 04/21/2017    Seniya Stoffers,CHRIS, PTA 07/15/2017, 6:02 PM  [facility] [address] [place], [place], [address] Phone: [phone]   Fax:  [phone]  Name: Megan Roberson MRN: [id number] Date of Birth: Nov 11, 1955  PHYSICAL THERAPY DISCHARGE SUMMARY  Visits from Start of Care: 4.  Current functional level related to goals / functional outcomes: See above.   Remaining deficits: See below.   Education / Equipment: HEP. Plan: Patient agrees to discharge.  Patient goals were not met. Patient is being discharged due to not returning since the last visit.  ?????         Mali Applegate MPT

## 2017-07-20 ENCOUNTER — Encounter: Payer: Medicare Other | Admitting: Physical Therapy

## 2017-07-22 ENCOUNTER — Encounter: Payer: Medicare Other | Admitting: *Deleted

## 2017-09-16 ENCOUNTER — Encounter (HOSPITAL_COMMUNITY): Payer: Self-pay | Admitting: *Deleted

## 2017-09-16 ENCOUNTER — Other Ambulatory Visit: Payer: Self-pay

## 2017-09-16 NOTE — Progress Notes (Signed)
Anesthesia Chart Review:  Pt is a same day work up.   Pt is a 61 year old female scheduled for hardware removal L olecranon on 09/17/2017 by Altamese , MD  - PCP Arliss Journey, MD at [facility] (notes in care everywhere). Pt was referred to GI but has not established care yet.   - Has new patient appointment 09/29/17 with hem-onc Roxana Hires, MD at [facility]  PMH includes:  Liver cirrhosis, pancytopenia, heart murmur (not specified), hypothyroidism. Never smoker. S/p ORIF L elbow fx 04/30/17.  - Hospitalized 10/25-31/18 for Mechanical fall w/ subacute olecranon fx s/p ORIF w/ displaced fixation. Found to have UTI. Found to have pancytopenia, thought due to liver disease.   Social history: Moved here from [place] last spring. Her daughter died of cirrhosis at age 66. She is adopted and therefore does not know any family history  Medications include: Iron, levothyroxine, potassium, rifaximin  Labs will be obtained day of surgery.  - Platelet count prior to ORIF 04/30/17 was 32. Most recent platelet count in care everywhere was 24 on 09/01/17. Usual platelet count seems to be 20's-30's. Last platelet transfusion at [facility] 08/27/17 did not change platelet counts - H/H 8.1/25.0 on 09/01/17 (care everywhere)  CXR 08/27/17:  1.Hazy right lower lobe airspace opacity, which may represent atelectasis, aspiration, and/or pneumonia. Recommend followup to resolution. 2.Age indeterminate compression deformities of 2 vertebral bodies of the thoracolumbar junction.  EKG 04/30/17: NSR  Abdominal US 08/30/17:  1.Cholelithiasis without acute cholecystitis. 2.Nonobstructive nephrolithiasis on the right. 3.Splenomegaly. 4.Portal hypertension. 5.Hepatic cirrhosis with ill-defined nonspecific subcentimeter echogenic lesions. Please note that the ultrasound sensitivity for detecting focal lesions is decreased in the background of cirrhosis. If there is a clinical concern for focal liver lesion (like  elevated serum AFP levels) an abdominal MRI should be performed for screening  Reviewed case with Dr. Tobias Alexander. Pt will need further assessment day of surgery by assigned anesthesiologist.    Willeen Cass, FNP-BC [facility]/Anesthesiology Phone: [phone] 09/16/2017 4:47 PM

## 2017-09-16 NOTE — Progress Notes (Addendum)
Mrs Hakeem denies chest pain or shortness of breath.  Patient reports that she has had paracentesis in the past in [place], "I think they tried to do one at [facility] did not get any fluid." Mrs. Godman's PCP is at [facility].  Patient reported that they are going to sending her to a liver specialist Labs were drawn at [facility] 09/01/17, Patlets were 3, Pt was 52., I notified Loletta Specter, FNP- BC.

## 2017-09-17 ENCOUNTER — Ambulatory Visit (HOSPITAL_COMMUNITY): Payer: Medicare Other | Admitting: Emergency Medicine

## 2017-09-17 ENCOUNTER — Ambulatory Visit (HOSPITAL_COMMUNITY)
Admission: RE | Admit: 2017-09-17 | Discharge: 2017-09-17 | Disposition: A | Discharge status: Home / Self Care | Payer: Medicare Other | Source: Ambulatory Visit | Attending: Orthopedic Surgery | Admitting: Orthopedic Surgery

## 2017-09-17 ENCOUNTER — Encounter (HOSPITAL_COMMUNITY)
Admission: RE | Disposition: A | Discharge status: Home / Self Care | Payer: Self-pay | Source: Ambulatory Visit | Attending: Orthopedic Surgery

## 2017-09-17 ENCOUNTER — Encounter (HOSPITAL_COMMUNITY): Payer: Self-pay | Admitting: *Deleted

## 2017-09-17 DIAGNOSIS — Y838 Other surgical procedures as the cause of abnormal reaction of the patient, or of later complication, without mention of misadventure at the time of the procedure: Secondary | ICD-10-CM | POA: Diagnosis not present

## 2017-09-17 DIAGNOSIS — Z880 Allergy status to penicillin: Secondary | ICD-10-CM | POA: Insufficient documentation

## 2017-09-17 DIAGNOSIS — Z881 Allergy status to other antibiotic agents status: Secondary | ICD-10-CM | POA: Diagnosis not present

## 2017-09-17 DIAGNOSIS — Z79890 Hormone replacement therapy (postmenopausal): Secondary | ICD-10-CM | POA: Diagnosis not present

## 2017-09-17 DIAGNOSIS — T8489XA Other specified complication of internal orthopedic prosthetic devices, implants and grafts, initial encounter: Secondary | ICD-10-CM | POA: Diagnosis not present

## 2017-09-17 DIAGNOSIS — Z79899 Other long term (current) drug therapy: Secondary | ICD-10-CM | POA: Insufficient documentation

## 2017-09-17 DIAGNOSIS — K7460 Unspecified cirrhosis of liver: Secondary | ICD-10-CM | POA: Diagnosis present

## 2017-09-17 DIAGNOSIS — F329 Major depressive disorder, single episode, unspecified: Secondary | ICD-10-CM | POA: Diagnosis not present

## 2017-09-17 DIAGNOSIS — E039 Hypothyroidism, unspecified: Secondary | ICD-10-CM | POA: Insufficient documentation

## 2017-09-17 DIAGNOSIS — Z882 Allergy status to sulfonamides status: Secondary | ICD-10-CM | POA: Insufficient documentation

## 2017-09-17 HISTORY — DX: Anxiety disorder, unspecified: F41.9

## 2017-09-17 HISTORY — DX: Other pancytopenia: D61.818

## 2017-09-17 HISTORY — DX: Polyneuropathy, unspecified: G62.9

## 2017-09-17 HISTORY — DX: Personal history of other medical treatment: Z92.89

## 2017-09-17 HISTORY — PX: HARDWARE REMOVAL: SHX979

## 2017-09-17 HISTORY — DX: Other specified postprocedural states: Z98.890

## 2017-09-17 LAB — CBC WITH DIFFERENTIAL/PLATELET
Basophils Absolute: 0 10*3/uL (ref 0.0–0.1)
Basophils Relative: 1 %
Eosinophils Absolute: 0.1 10*3/uL (ref 0.0–0.7)
Eosinophils Relative: 4 %
HCT: 40.1 % (ref 36.0–46.0)
Hemoglobin: 12.7 g/dL (ref 12.0–15.0)
Lymphocytes Relative: 23 %
Lymphs Abs: 0.7 10*3/uL (ref 0.7–4.0)
MCH: 30.4 pg (ref 26.0–34.0)
MCHC: 31.7 g/dL (ref 30.0–36.0)
MCV: 95.9 fL (ref 78.0–100.0)
Monocytes Absolute: 0.5 10*3/uL (ref 0.1–1.0)
Monocytes Relative: 15 %
Neutro Abs: 1.8 10*3/uL (ref 1.7–7.7)
Neutrophils Relative %: 56 %
Platelets: 36 10*3/uL — ABNORMAL LOW (ref 150–400)
RBC: 4.18 MIL/uL (ref 3.87–5.11)
RDW: 20 % — ABNORMAL HIGH (ref 11.5–15.5)
WBC: 3.2 10*3/uL — ABNORMAL LOW (ref 4.0–10.5)

## 2017-09-17 LAB — COMPREHENSIVE METABOLIC PANEL
ALT: 15 U/L (ref 14–54)
AST: 57 U/L — ABNORMAL HIGH (ref 15–41)
Albumin: 3.3 g/dL — ABNORMAL LOW (ref 3.5–5.0)
Alkaline Phosphatase: 114 U/L (ref 38–126)
Anion gap: 8 (ref 5–15)
BUN: 9 mg/dL (ref 6–20)
CO2: 24 mmol/L (ref 22–32)
Calcium: 9 mg/dL (ref 8.9–10.3)
Chloride: 111 mmol/L (ref 101–111)
Creatinine, Ser: 0.76 mg/dL (ref 0.44–1.00)
GFR calc Af Amer: >60 mL/min (ref >60)
GFR calc non Af Amer: >60 mL/min (ref >60)
Glucose, Bld: 81 mg/dL (ref 65–99)
Potassium: 3.9 mmol/L (ref 3.5–5.1)
Sodium: 143 mmol/L (ref 135–145)
Total Bilirubin: 2.8 mg/dL — ABNORMAL HIGH (ref 0.3–1.2)
Total Protein: 6.9 g/dL (ref 6.5–8.1)

## 2017-09-17 LAB — PROTIME-INR
INR: 1.45
Prothrombin Time: 17.5 seconds — ABNORMAL HIGH (ref 11.4–15.2)

## 2017-09-17 SURGERY — REMOVAL, HARDWARE
Anesthesia: General | Site: Elbow | Laterality: Left

## 2017-09-17 MED ORDER — MIDAZOLAM HCL 5 MG/5ML IJ SOLN
INTRAMUSCULAR | Status: DC | PRN
  Administered 2017-09-17 (×2): 1 mg via INTRAVENOUS

## 2017-09-17 MED ORDER — ROCURONIUM BROMIDE 100 MG/10ML IV SOLN
INTRAVENOUS | Status: DC | PRN
  Administered 2017-09-17: 35 mg via INTRAVENOUS
  Administered 2017-09-17: 15 mg via INTRAVENOUS

## 2017-09-17 MED ORDER — FENTANYL CITRATE (PF) 250 MCG/5ML IJ SOLN
INTRAMUSCULAR | Status: AC
  Filled 2017-09-17: qty 5

## 2017-09-17 MED ORDER — SUGAMMADEX SODIUM 200 MG/2ML IV SOLN
INTRAVENOUS | Status: DC | PRN
  Administered 2017-09-17: 130 mg via INTRAVENOUS

## 2017-09-17 MED ORDER — EPHEDRINE SULFATE 50 MG/ML IJ SOLN
INTRAMUSCULAR | Status: DC | PRN
  Administered 2017-09-17 (×2): 10 mg via INTRAVENOUS

## 2017-09-17 MED ORDER — FENTANYL CITRATE (PF) 100 MCG/2ML IJ SOLN
INTRAMUSCULAR | Status: DC | PRN
  Administered 2017-09-17 (×5): 50 ug via INTRAVENOUS

## 2017-09-17 MED ORDER — DEXAMETHASONE SODIUM PHOSPHATE 10 MG/ML IJ SOLN
INTRAMUSCULAR | Status: DC | PRN
  Administered 2017-09-17: 5 mg via INTRAVENOUS

## 2017-09-17 MED ORDER — PROPOFOL 10 MG/ML IV BOLUS
INTRAVENOUS | Status: DC | PRN
  Administered 2017-09-17: 140 mg via INTRAVENOUS
  Administered 2017-09-17: 60 mg via INTRAVENOUS

## 2017-09-17 MED ORDER — ARTIFICIAL TEARS OPHTHALMIC OINT
TOPICAL_OINTMENT | OPHTHALMIC | Status: DC | PRN
  Administered 2017-09-17: 1 via OPHTHALMIC

## 2017-09-17 MED ORDER — CLINDAMYCIN PHOSPHATE 900 MG/50ML IV SOLN
900.0000 mg | INTRAVENOUS | Status: AC
  Administered 2017-09-17: 900 mg via INTRAVENOUS
  Filled 2017-09-17: qty 50

## 2017-09-17 MED ORDER — OXYCODONE HCL 5 MG PO TABS
ORAL_TABLET | ORAL | Status: AC
  Filled 2017-09-17: qty 1

## 2017-09-17 MED ORDER — OXYCODONE HCL 5 MG PO TABS: 5.0000 mg | ORAL_TABLET | Freq: Three times a day (TID) | ORAL | 0 refills | Status: DC | PRN

## 2017-09-17 MED ORDER — CHLORHEXIDINE GLUCONATE 4 % EX LIQD: 60.0000 mL | Freq: Once | CUTANEOUS | Status: DC

## 2017-09-17 MED ORDER — OXYCODONE HCL 5 MG/5ML PO SOLN: 5.0000 mg | Freq: Once | ORAL | Status: AC | PRN

## 2017-09-17 MED ORDER — ONDANSETRON HCL 4 MG/2ML IJ SOLN: INTRAMUSCULAR | Status: DC | PRN

## 2017-09-17 MED ORDER — LIDOCAINE HCL (CARDIAC) 20 MG/ML IV SOLN
INTRAVENOUS | Status: DC | PRN
  Administered 2017-09-17: 60 mg via INTRAVENOUS

## 2017-09-17 MED ORDER — LACTATED RINGERS IV SOLN
INTRAVENOUS | Status: DC
  Administered 2017-09-17 (×2): via INTRAVENOUS

## 2017-09-17 MED ORDER — MIDAZOLAM HCL 2 MG/2ML IJ SOLN
INTRAMUSCULAR | Status: AC
  Filled 2017-09-17: qty 2

## 2017-09-17 MED ORDER — NABUMETONE 500 MG PO TABS: 500.0000 mg | ORAL_TABLET | Freq: Two times a day (BID) | ORAL | 0 refills | Status: AC

## 2017-09-17 MED ORDER — HYDROMORPHONE HCL 1 MG/ML IJ SOLN: 0.2500 mg | INTRAMUSCULAR | Status: DC | PRN

## 2017-09-17 MED ORDER — 0.9 % SODIUM CHLORIDE (POUR BTL) OPTIME
TOPICAL | Status: DC | PRN
  Administered 2017-09-17: 1000 mL

## 2017-09-17 MED ORDER — PROPOFOL 10 MG/ML IV BOLUS
INTRAVENOUS | Status: AC
  Filled 2017-09-17: qty 40

## 2017-09-17 MED ORDER — OXYCODONE HCL 5 MG PO TABS
5.0000 mg | ORAL_TABLET | Freq: Once | ORAL | Status: AC | PRN
  Administered 2017-09-17: 5 mg via ORAL

## 2017-09-17 MED ORDER — ONDANSETRON 4 MG PO TBDP: 4.0000 mg | ORAL_TABLET | Freq: Three times a day (TID) | ORAL | 0 refills | Status: DC | PRN

## 2017-09-17 MED ORDER — ONDANSETRON HCL 4 MG/2ML IJ SOLN
INTRAMUSCULAR | Status: DC | PRN
  Administered 2017-09-17: 4 mg via INTRAVENOUS

## 2017-09-17 MED ORDER — SUGAMMADEX SODIUM 200 MG/2ML IV SOLN
INTRAVENOUS | Status: AC
  Filled 2017-09-17: qty 2

## 2017-09-17 MED FILL — NABUMETONE 500 MG TABLET: 500 | 5 days supply | Qty: 10 | Fill #0

## 2017-09-17 MED FILL — oxyCODONE HCL 5 MG TABS: 5 | 7 days supply | Qty: 40 | Fill #0

## 2017-09-17 SURGICAL SUPPLY — 62 items
BANDAGE ACE 4X5 VEL STRL LF (GAUZE/BANDAGES/DRESSINGS) ×2 IMPLANT
BANDAGE ACE 6X5 VEL STRL LF (GAUZE/BANDAGES/DRESSINGS) ×2 IMPLANT
BANDAGE ESMARK 6X9 LF (GAUZE/BANDAGES/DRESSINGS) ×1 IMPLANT
BNDG COHESIVE 6X5 TAN STRL LF (GAUZE/BANDAGES/DRESSINGS) ×2 IMPLANT
BNDG ESMARK 6X9 LF (GAUZE/BANDAGES/DRESSINGS) ×2
BNDG GAUZE ELAST 4 BULKY (GAUZE/BANDAGES/DRESSINGS) ×2 IMPLANT
BRUSH SCRUB SURG 4.25 DISP (MISCELLANEOUS) ×4 IMPLANT
CONT SPEC 4OZ CLIKSEAL STRL BL (MISCELLANEOUS) ×2 IMPLANT
COVER SURGICAL LIGHT HANDLE (MISCELLANEOUS) ×4 IMPLANT
CUFF TOURNIQUET SINGLE 18IN (TOURNIQUET CUFF) IMPLANT
CUFF TOURNIQUET SINGLE 24IN (TOURNIQUET CUFF) IMPLANT
CUFF TOURNIQUET SINGLE 34IN LL (TOURNIQUET CUFF) IMPLANT
DRAPE C-ARM 42X72 X-RAY (DRAPES) IMPLANT
DRAPE C-ARMOR (DRAPES) ×2 IMPLANT
DRAPE U-SHAPE 47X51 STRL (DRAPES) ×2 IMPLANT
DRSG ADAPTIC 3X8 NADH LF (GAUZE/BANDAGES/DRESSINGS) ×2 IMPLANT
ELECT REM PT RETURN 9FT ADLT (ELECTROSURGICAL) ×2
ELECTRODE REM PT RTRN 9FT ADLT (ELECTROSURGICAL) ×1 IMPLANT
GAUZE SPONGE 4X4 12PLY STRL (GAUZE/BANDAGES/DRESSINGS) ×2 IMPLANT
GLOVE BIO SURGEON STRL SZ7.5 (GLOVE) ×2 IMPLANT
GLOVE BIO SURGEON STRL SZ8 (GLOVE) ×2 IMPLANT
GLOVE BIOGEL PI IND STRL 7.5 (GLOVE) ×1 IMPLANT
GLOVE BIOGEL PI IND STRL 8 (GLOVE) ×1 IMPLANT
GLOVE BIOGEL PI INDICATOR 7.5 (GLOVE) ×1
GLOVE BIOGEL PI INDICATOR 8 (GLOVE) ×1
GOWN STRL REUS W/ TWL LRG LVL3 (GOWN DISPOSABLE) ×2 IMPLANT
GOWN STRL REUS W/ TWL XL LVL3 (GOWN DISPOSABLE) ×1 IMPLANT
GOWN STRL REUS W/TWL LRG LVL3 (GOWN DISPOSABLE) ×2
GOWN STRL REUS W/TWL XL LVL3 (GOWN DISPOSABLE) ×1
KIT BASIN OR (CUSTOM PROCEDURE TRAY) ×2 IMPLANT
KIT ROOM TURNOVER OR (KITS) ×2 IMPLANT
MANIFOLD NEPTUNE II (INSTRUMENTS) ×2 IMPLANT
NEEDLE 22X1 1/2 (OR ONLY) (NEEDLE) IMPLANT
NS IRRIG 1000ML POUR BTL (IV SOLUTION) ×2 IMPLANT
PACK ORTHO EXTREMITY (CUSTOM PROCEDURE TRAY) ×2 IMPLANT
PAD ARMBOARD 7.5X6 YLW CONV (MISCELLANEOUS) ×4 IMPLANT
PAD CAST 4YDX4 CTTN HI CHSV (CAST SUPPLIES) ×1 IMPLANT
PADDING CAST COTTON 4X4 STRL (CAST SUPPLIES) ×1
PADDING CAST COTTON 6X4 STRL (CAST SUPPLIES) ×6 IMPLANT
SLING ARM MED ADULT FOAM STRAP (SOFTGOODS) ×2 IMPLANT
SPONGE LAP 18X18 X RAY DECT (DISPOSABLE) ×4 IMPLANT
STAPLER VISISTAT 35W (STAPLE) ×2 IMPLANT
STOCKINETTE IMPERVIOUS 9X36 MD (GAUZE/BANDAGES/DRESSINGS) ×2 IMPLANT
STOCKINETTE IMPERVIOUS LG (DRAPES) ×2 IMPLANT
STRIP CLOSURE SKIN 1/2X4 (GAUZE/BANDAGES/DRESSINGS) IMPLANT
SUCTION FRAZIER HANDLE 10FR (MISCELLANEOUS)
SUCTION TUBE FRAZIER 10FR DISP (MISCELLANEOUS) IMPLANT
SUT ETHILON 3 0 PS 1 (SUTURE) IMPLANT
SUT PDS AB 2-0 CT1 27 (SUTURE) IMPLANT
SUT VIC AB 0 CT1 27 (SUTURE)
SUT VIC AB 0 CT1 27XBRD ANBCTR (SUTURE) IMPLANT
SUT VIC AB 1 CTX 36 (SUTURE) ×1
SUT VIC AB 1 CTX36XBRD ANBCTR (SUTURE) ×1 IMPLANT
SUT VIC AB 2-0 CT1 27 (SUTURE)
SUT VIC AB 2-0 CT1 TAPERPNT 27 (SUTURE) IMPLANT
SYR CONTROL 10ML LL (SYRINGE) IMPLANT
TOWEL OR 17X24 6PK STRL BLUE (TOWEL DISPOSABLE) ×4 IMPLANT
TOWEL OR 17X26 10 PK STRL BLUE (TOWEL DISPOSABLE) ×4 IMPLANT
TUBE CONNECTING 12X1/4 (SUCTIONS) ×2 IMPLANT
UNDERPAD 30X30 (UNDERPADS AND DIAPERS) ×2 IMPLANT
WATER STERILE IRR 1000ML POUR (IV SOLUTION) ×4 IMPLANT
YANKAUER SUCT BULB TIP NO VENT (SUCTIONS) ×2 IMPLANT

## 2017-09-17 NOTE — Anesthesia Procedure Notes (Signed)
Procedure Name: Intubation Date/Time: 09/17/2017 2:15 PM Performed by: Scheryl Darter, CRNA Pre-anesthesia Checklist: Patient identified, Emergency Drugs available, Suction available and Patient being monitored Patient Re-evaluated:Patient Re-evaluated prior to induction Oxygen Delivery Method: Circle System Utilized Preoxygenation: Pre-oxygenation with 100% oxygen Induction Type: IV induction Ventilation: Mask ventilation without difficulty Laryngoscope Size: Miller and 2 Grade View: Grade I Tube type: Oral Tube size: 7.0 mm Number of attempts: 1 Airway Equipment and Method: Stylet and Oral airway Placement Confirmation: ETT inserted through vocal cords under direct vision,  positive ETCO2 and breath sounds checked- equal and bilateral Secured at: 22 cm Tube secured with: Tape Dental Injury: Teeth and Oropharynx as per pre-operative assessment

## 2017-09-17 NOTE — Transfer of Care (Signed)
Immediate Anesthesia Transfer of Care Note  Patient: Megan Roberson  Procedure(s) Performed: HARDWARE REMOVAL LEFT OLECRANON (Left Elbow)  Patient Location: PACU  Anesthesia Type:General  Level of Consciousness: awake and patient cooperative  Airway & Oxygen Therapy: Patient Spontanous Breathing  Post-op Assessment: Report given to RN and Post -op Vital signs reviewed and stable  Post vital signs: Reviewed and stable  Last Vitals:  Vitals:   09/17/17 0915 09/17/17 1545  BP: 125/61 121/60  Pulse: 61 86  Resp: 18 16  Temp: 36.7 C 36.4 C  SpO2: 100% 96%    Last Pain:  Vitals:   09/17/17 1545  TempSrc:   PainSc: 0-No pain         Complications: No apparent anesthesia complications

## 2017-09-17 NOTE — Discharge Instructions (Addendum)
Orthopaedic Trauma Service Discharge Instructions   General Discharge Instructions  WEIGHT BEARING STATUS: As tolerated left arm  RANGE OF MOTION/ACTIVITY: As tolerated left arm   Wound Care: Daily dressing changes starting on 09/19/2017.  Please see below  Discharge Wound Care Instructions  Do NOT apply any ointments, solutions or lotions to pin sites or surgical wounds.  These prevent needed drainage and even though solutions like hydrogen peroxide kill bacteria, they also damage cells lining the pin sites that help fight infection.  Applying lotions or ointments can keep the wounds moist and can cause them to breakdown and open up as well. This can increase the risk for infection. When in doubt call the office.  Surgical incisions should be dressed daily.  If any drainage is noted, use one layer of adaptic, then gauze, Kerlix, and an ace wrap.  Once the incision is completely dry and without drainage, it may be left open to air out.  Showering may begin 36-48 hours later.  Cleaning gently with soap and water.  Traumatic wounds should be dressed daily as well.    One layer of adaptic, gauze, Kerlix, then ace wrap.  The adaptic can be discontinued once the draining has ceased    If you have a wet to dry dressing: wet the gauze with saline the squeeze as much saline out so the gauze is moist (not soaking wet), place moistened gauze over wound, then place a dry gauze over the moist one, followed by Kerlix wrap, then ace wrap.    Diet: as you were eating previously.  Can use over the counter stool softeners and bowel preparations, such as Miralax, to help with bowel movements.  Narcotics can be constipating.  Be sure to drink plenty of fluids  PAIN MEDICATION USE AND EXPECTATIONS  You have likely been given narcotic medications to help control your pain.  After a traumatic event that results in an fracture (broken bone) with or without surgery, it is ok to use narcotic pain medications  to help control one's pain.  We understand that everyone responds to pain differently and each individual patient will be evaluated on a regular basis for the continued need for narcotic medications. Ideally, narcotic medication use should last no more than 6-8 weeks (coinciding with fracture healing).   As a patient it is your responsibility as well to monitor narcotic medication use and report the amount and frequency you use these medications when you come to your office visit.   We would also advise that if you are using narcotic medications, you should take a dose prior to therapy to maximize you participation.  IF YOU ARE ON NARCOTIC MEDICATIONS IT IS NOT PERMISSIBLE TO OPERATE A MOTOR VEHICLE (MOTORCYCLE/CAR/TRUCK/MOPED) OR HEAVY MACHINERY DO NOT MIX NARCOTICS WITH OTHER CNS (CENTRAL NERVOUS SYSTEM) DEPRESSANTS SUCH AS ALCOHOL   STOP SMOKING OR USING NICOTINE PRODUCTS!!!!  As discussed nicotine severely impairs your body's ability to heal surgical and traumatic wounds but also impairs bone healing.  Wounds and bone heal by forming microscopic blood vessels (angiogenesis) and nicotine is a vasoconstrictor (essentially, shrinks blood vessels).  Therefore, if vasoconstriction occurs to these microscopic blood vessels they essentially disappear and are unable to deliver necessary nutrients to the healing tissue.  This is one modifiable factor that you can do to dramatically increase your chances of healing your injury.    (This means no smoking, no nicotine gum, patches, etc)  DO NOT USE NONSTEROIDAL ANTI-INFLAMMATORY DRUGS (NSAID'S)  Using products such as  Advil (ibuprofen), Aleve (naproxen), Motrin (ibuprofen) for additional pain control during fracture healing can delay and/or prevent the healing response.  If you would like to take over the counter (OTC) medication, Tylenol (acetaminophen) is ok.  However, some narcotic medications that are given for pain control contain acetaminophen as well.  Therefore, you should not exceed more than 4000 mg of tylenol in a day if you do not have liver disease.  Also note that there are may OTC medicines, such as cold medicines and allergy medicines that my contain tylenol as well.  If you have any questions about medications and/or interactions please ask your doctor/PA or your pharmacist.      ICE AND ELEVATE INJURED/OPERATIVE EXTREMITY  Using ice and elevating the injured extremity above your heart can help with swelling and pain control.  Icing in a pulsatile fashion, such as 20 minutes on and 20 minutes off, can be followed.    Do not place ice directly on skin. Make sure there is a barrier between to skin and the ice pack.    Using frozen items such as frozen peas works well as the conform nicely to the are that needs to be iced.  USE AN ACE WRAP OR TED HOSE FOR SWELLING CONTROL  In addition to icing and elevation, Ace wraps or TED hose are used to help limit and resolve swelling.  It is recommended to use Ace wraps or TED hose until you are informed to stop.    When using Ace Wraps start the wrapping distally (farthest away from the body) and wrap proximally (closer to the body)   Example: If you had surgery on your leg or thing and you do not have a splint on, start the ace wrap at the toes and work your way up to the thigh        If you had surgery on your upper extremity and do not have a splint on, start the ace wrap at your fingers and work your way up to the upper arm  IF YOU ARE IN A SPLINT OR CAST DO NOT [place]   If your splint gets wet for any reason please contact the office immediately. You may shower in your splint or cast as long as you keep it dry.  This can be done by wrapping in a cast cover or garbage back (or similar)  Do Not stick any thing down your splint or cast such as pencils, money, or hangers to try and scratch yourself with.  If you feel itchy take benadryl as prescribed on the bottle for itching  IF  YOU ARE IN A CAM BOOT (BLACK BOOT)  You may remove boot periodically. Perform daily dressing changes as noted below.  Wash the liner of the boot regularly and wear a sock when wearing the boot. It is recommended that you sleep in the boot until told otherwise  CALL THE OFFICE WITH ANY QUESTIONS OR CONCERNS: [phone]

## 2017-09-17 NOTE — Anesthesia Preprocedure Evaluation (Addendum)
Anesthesia Evaluation  Patient identified by MRN, date of birth, ID band Patient awake    Reviewed: Allergy & Precautions, NPO status , Patient's Chart, lab work & pertinent test results  Airway Mallampati: I  TM Distance: >3 FB Neck ROM: Full    Dental  (+) Edentulous Upper   Pulmonary neg pulmonary ROS,    breath sounds clear to auscultation       Cardiovascular negative cardio ROS   Rhythm:Regular Rate:Normal     Neuro/Psych    GI/Hepatic (+) Cirrhosis       ,   Endo/Other  Hypothyroidism   Renal/GU      Musculoskeletal   Abdominal   Peds  Hematology  (+) anemia ,   Anesthesia Other Findings   Reproductive/Obstetrics                            Anesthesia Physical Anesthesia Plan  ASA: III  Anesthesia Plan: General   Post-op Pain Management:    Induction: Intravenous  PONV Risk Score and Plan: 4 or greater and Ondansetron, Dexamethasone and Treatment may vary due to age or medical condition  Airway Management Planned: Oral ETT  Additional Equipment:   Intra-op Plan:   Post-operative Plan: Extubation in OR  Informed Consent: I have reviewed the patients History and Physical, chart, labs and discussed the procedure including the risks, benefits and alternatives for the proposed anesthesia with the patient or authorized representative who has indicated his/her understanding and acceptance.   Dental advisory given  Plan Discussed with: CRNA  Anesthesia Plan Comments:         Anesthesia Quick Evaluation

## 2017-09-17 NOTE — H&P (Signed)
Orthopaedic Trauma Service (OTS) Consult   Patient ID: Megan Roberson MRN: [id number] DOB/AGE: Aug 13, 1956 61 y.o.   HPI: Megan Roberson is an 61 y.o. female well known to OTS. Pt originally sustained and injury several months ago in [place] to L hip and left elbow. Pt had hip fracture surgery as well as ORIF L olecranon. Pt moved to [place] several months ago, she disrupted her L olecranon repair. She underwent repair of this and once again disrupted her repair. She presents today for [facility] L elbow. She is doing well otherwise   Past Medical History:  Diagnosis Date  . Anxiety   . Cirrhosis of liver ([place])   . Depression   . Heart murmur    "not concerned about "   . History of abdominal paracentesis   . History of blood transfusion   . Hypothyroidism   . Liver disease 2008  . Neuropathy   . Pancytopenia [facility])     Past Surgical History:  Procedure Laterality Date  . COLONOSCOPY WITH ESOPHAGOGASTRODUODENOSCOPY (EGD) AND ESOPHAGEAL DILATION (ED)    . ESOPHAGOGASTRODUODENOSCOPY W/ BANDING     Varices  . FRACTURE SURGERY Left    x 2  . HIP SURGERY Left    from fracture- rod in palce  . KNEE SURGERY Bilateral    open incision for ligaments  . ORIF ELBOW FRACTURE Left 04/30/2017   Procedure: REVISION OPEN REDUCTION INTERNAL FIXATION (ORIF) ELBOW/OLECRANON FRACTURE;  Surgeon: Altamese Denton, MD;  Location: [place];  Service: Orthopedics;  Laterality: Left;    Family History  Adopted: Yes    Social History:  reports that  has never smoked. she has never used smokeless tobacco. She reports that she does not drink alcohol or use drugs.  Allergies:  Allergies  Allergen Reactions  . Penicillins Swelling    FACIAL SWELLING  PATIENT HAS HAD A PCN REACTION WITH IMMEDIATE RASH, FACIAL/TONGUE/THROAT SWELLING, SOB, OR LIGHTHEADEDNESS WITH HYPOTENSION:  #  #  #  YES  #  #  #   Has patient had a PCN reaction causing severe rash involving mucus membranes or skin necrosis: No Has patient had  a PCN reaction that required hospitalization: No Has patient had a PCN reaction occurring within the last 10 years: No If all of the above answers are "NO", then may proceed with Cephalosporin u  . Sulfa Antibiotics Itching and Rash    Medications:  Current Meds  Medication Sig  . ferrous sulfate 325 (65 FE) MG tablet Take 325 mg 4 (four) times a week by mouth. IN THE EVENING (3-4 TIMES A WEEK)  . gabapentin (NEURONTIN) 600 MG tablet Take 600 mg at bedtime by mouth.  . levothyroxine (SYNTHROID, LEVOTHROID) 50 MCG tablet Take 50 mcg daily before breakfast by mouth.   . potassium chloride (K-DUR,KLOR-CON) 10 MEQ tablet Take 10 mEq 2 (two) times daily by mouth.  . rifaximin (XIFAXAN) 200 MG tablet Take 600 mg every evening by mouth.  . sertraline (ZOLOFT) 100 MG tablet Take 100 mg at bedtime by mouth.  . traZODone (DESYREL) 50 MG tablet Take 50 mg at bedtime by mouth.     Labs pending   Review of Systems  Constitutional: Negative for chills and fever.  Respiratory: Negative for shortness of breath and wheezing.   Cardiovascular: Negative for chest pain and palpitations.  Gastrointestinal: Negative for abdominal pain, nausea and vomiting.  Neurological: Negative for tingling and sensory change.     Vitals on arrival to short stay  Physical Exam  Constitutional: She is cooperative.  Cardiovascular: Normal rate and regular rhythm.  Pulmonary/Chest: No respiratory distress.  Musculoskeletal:  Left upper extremity     Left elbow wounds healed    No drainage    Plate/hardware is prominent    Pt can actively extend elbow    Motor and sensory functions grossly intact     + radial pulse   Neurological: She is alert.  Psychiatric: She has a normal mood and affect. Her speech is normal.     Assessment/Plan:  61 y/o female with symptomatic hardware L olecranon   - symptomatic HW L olecranon   ROH   outpt procedure  No restrictions post op  Risks and benefits reviewed, pt  wishes to proceed   - Dispo:  OR for [place], PA-C [facility] [phone] (C) [phone] (O) 09/17/2017, 8:26 AM

## 2017-09-20 ENCOUNTER — Encounter (HOSPITAL_COMMUNITY): Payer: Self-pay | Admitting: Orthopedic Surgery

## 2017-09-20 NOTE — Op Note (Signed)
NAME:  Megan Roberson, Megan Roberson NO.:  MEDICAL RECORD NO.:  [id number]  LOCATION:                                 FACILITY:  PHYSICIAN:  Astrid Divine. Marcelino Scot, M.D. DATE OF BIRTH:  [birth year]  DATE OF PROCEDURE:  09/17/2017 DATE OF DISCHARGE:                              OPERATIVE REPORT   PREOPERATIVE DIAGNOSIS:  Symptomatic hardware, left olecranon.  POSTOPERATIVE DIAGNOSES: 1. Symptomatic hardware, left olecranon. 2. Chronic nonunion triceps attachment.  PROCEDURES: 1. Removal of deep implant, left olecranon. 2. Suture stabilization of left triceps attachment without formal     repair.  ASSISTANT:  None.  BRIEF SUMMARY OF INDICATION FOR PROCEDURE:  Megan Roberson is a very pleasant 61 year old female with fragile health owing to multiple medical problems, most prominently liver cirrhosis.  The patient has undergone 2 well constructed attempts at repair of olecranon and triceps avulsion on the left elbow.  These both resulted in failure given her compromised bone and soft tissues.  The hardware remains extremely prominent because of her cachexia.  The heads of the screws were easily identified underneath her skin.  She has had persistent pain and sensitivity and frequently bumps in this area and has difficulty resting it on any surface.  I discussed with her the risks and benefits of surgical removal as well as the risks and benefits of attempted revision repair versus acceptance of the displacement.  She did not wish for revision repair and simply wished for removal, understanding that to mean the probability of permanent weakness and possibly some pain in the triceps area.  I also discussed infection, bleeding, nerve injury, vessel injury, DVT, PE, and multiple others including refracture or occult nonunion.  She did wish to proceed.  BRIEF SUMMARY OF PROCEDURE:  The patient was taken to the operating room, where general anesthesia was induced.  She did  receive clindamycin preoperatively.  Her left upper extremity was prepped and draped in usual sterile fashion.  No tourniquet was used during the procedure. The old incision was remade, dissection was carried sharply down to the plate.  Heads of the screws were identified and removed.  The very large suture knot that had been made directly over the stainless steel plate was cut and the suture removed.  This did allow for some mobility of the triceps, which was ununited.  I did not wish to disrupt the soft tissue envelope overlying this, but wished to add additional support. Consequently, a series of figure-of-eight #1 Vicryl were passed around the triceps insertion and brought out in the soft tissues just lateral to the olecranon.  I did not want to tie around the olecranon in case the compromised bone there would result in fracture.  I did also irrigate this thoroughly after removal of the plate.  A standard layered closure was performed.  There was some reapproximation of the FiberWire. The patient was then placed into a gently compressive dressing, taken to PACU in stable condition.  PROGNOSIS:  The patient will be in a sling for comfort.  She may remove the dressing and shower in 3 days.  We will plan to see her back for removal of  her sutures in 2 weeks.  She will contact me in the interim with any problems, concerns, or questions.  She is at elevated risk for complications including wound problems as result of her generalized deconditioning and soft tissue integrity.     Astrid Divine. Marcelino Scot, M.D.     MHH/MEDQ  D:  09/17/2017  T:  09/18/2017  Job:  [id number]

## 2017-09-28 NOTE — Anesthesia Postprocedure Evaluation (Signed)
Anesthesia Post Note  Patient: Megan Roberson  Procedure(s) Performed: HARDWARE REMOVAL LEFT OLECRANON (Left Elbow)     Patient location during evaluation: PACU Anesthesia Type: General Level of consciousness: awake and alert Pain management: pain level controlled Vital Signs Assessment: post-procedure vital signs reviewed and stable Respiratory status: spontaneous breathing, nonlabored ventilation, respiratory function stable and patient connected to nasal cannula oxygen Cardiovascular status: blood pressure returned to baseline and stable Postop Assessment: no apparent nausea or vomiting Anesthetic complications: no    Last Vitals:  Vitals:   09/17/17 1645 09/17/17 1700  BP: 111/66 (!) 102/59  Pulse: 81 73  Resp: 14 14  Temp:  36.7 C  SpO2: 94% 94%    Last Pain:  Vitals:   09/17/17 1545  TempSrc:   PainSc: 0-No pain                 Pandora Mccrackin,JAMES TERRILL

## 2017-10-13 ENCOUNTER — Emergency Department (HOSPITAL_COMMUNITY): Payer: Medicare Other

## 2017-10-13 ENCOUNTER — Other Ambulatory Visit: Payer: Self-pay

## 2017-10-13 ENCOUNTER — Observation Stay (HOSPITAL_COMMUNITY)
Admission: EM | Admit: 2017-10-13 | Discharge: 2017-10-15 | Disposition: A | Discharge status: Home / Self Care | Payer: Medicare Other | Attending: Internal Medicine | Admitting: Internal Medicine

## 2017-10-13 ENCOUNTER — Encounter (HOSPITAL_COMMUNITY): Payer: Self-pay | Admitting: *Deleted

## 2017-10-13 DIAGNOSIS — E039 Hypothyroidism, unspecified: Secondary | ICD-10-CM | POA: Diagnosis not present

## 2017-10-13 DIAGNOSIS — W19XXXA Unspecified fall, initial encounter: Secondary | ICD-10-CM

## 2017-10-13 DIAGNOSIS — S32010A Wedge compression fracture of first lumbar vertebra, initial encounter for closed fracture: Principal | ICD-10-CM | POA: Insufficient documentation

## 2017-10-13 DIAGNOSIS — F329 Major depressive disorder, single episode, unspecified: Secondary | ICD-10-CM | POA: Diagnosis not present

## 2017-10-13 DIAGNOSIS — E876 Hypokalemia: Secondary | ICD-10-CM | POA: Diagnosis not present

## 2017-10-13 DIAGNOSIS — W010XXA Fall on same level from slipping, tripping and stumbling without subsequent striking against object, initial encounter: Secondary | ICD-10-CM | POA: Diagnosis not present

## 2017-10-13 DIAGNOSIS — R9431 Abnormal electrocardiogram [ECG] [EKG]: Secondary | ICD-10-CM

## 2017-10-13 DIAGNOSIS — R51 Headache: Secondary | ICD-10-CM | POA: Insufficient documentation

## 2017-10-13 DIAGNOSIS — Y929 Unspecified place or not applicable: Secondary | ICD-10-CM | POA: Diagnosis not present

## 2017-10-13 DIAGNOSIS — Z79899 Other long term (current) drug therapy: Secondary | ICD-10-CM | POA: Insufficient documentation

## 2017-10-13 DIAGNOSIS — S3992XA Unspecified injury of lower back, initial encounter: Secondary | ICD-10-CM | POA: Diagnosis present

## 2017-10-13 DIAGNOSIS — R531 Weakness: Secondary | ICD-10-CM | POA: Insufficient documentation

## 2017-10-13 DIAGNOSIS — Y999 Unspecified external cause status: Secondary | ICD-10-CM | POA: Diagnosis not present

## 2017-10-13 DIAGNOSIS — Y939 Activity, unspecified: Secondary | ICD-10-CM | POA: Diagnosis not present

## 2017-10-13 DIAGNOSIS — S32019A Unspecified fracture of first lumbar vertebra, initial encounter for closed fracture: Secondary | ICD-10-CM | POA: Diagnosis not present

## 2017-10-13 DIAGNOSIS — K7030 Alcoholic cirrhosis of liver without ascites: Secondary | ICD-10-CM | POA: Diagnosis not present

## 2017-10-13 DIAGNOSIS — D696 Thrombocytopenia, unspecified: Secondary | ICD-10-CM | POA: Diagnosis not present

## 2017-10-13 LAB — URINALYSIS, ROUTINE W REFLEX MICROSCOPIC
Glucose, UA: NEGATIVE mg/dL
Ketones, ur: NEGATIVE mg/dL
Leukocytes, UA: NEGATIVE
Nitrite: NEGATIVE
Protein, ur: 30 mg/dL — AB
Specific Gravity, Urine: 1.016 (ref 1.005–1.030)
pH: 6 (ref 5.0–8.0)

## 2017-10-13 LAB — CBC WITH DIFFERENTIAL/PLATELET
Basophils Absolute: 0 10*3/uL (ref 0.0–0.1)
Basophils Relative: 1 %
Eosinophils Absolute: 0 10*3/uL (ref 0.0–0.7)
Eosinophils Relative: 1 %
HCT: 43.7 % (ref 36.0–46.0)
Hemoglobin: 14 g/dL (ref 12.0–15.0)
Lymphocytes Relative: 16 %
Lymphs Abs: 1.1 10*3/uL (ref 0.7–4.0)
MCH: 30.8 pg (ref 26.0–34.0)
MCHC: 32 g/dL (ref 30.0–36.0)
MCV: 96.3 fL (ref 78.0–100.0)
Monocytes Absolute: 1.1 10*3/uL — ABNORMAL HIGH (ref 0.1–1.0)
Monocytes Relative: 16 %
Neutro Abs: 4.7 10*3/uL (ref 1.7–7.7)
Neutrophils Relative %: 68 %
Platelets: 50 10*3/uL — ABNORMAL LOW (ref 150–400)
RBC: 4.54 MIL/uL (ref 3.87–5.11)
RDW: 20.1 % — ABNORMAL HIGH (ref 11.5–15.5)
WBC: 6.9 10*3/uL (ref 4.0–10.5)

## 2017-10-13 LAB — BASIC METABOLIC PANEL
Anion gap: 11 (ref 5–15)
BUN: 10 mg/dL (ref 6–20)
CO2: 28 mmol/L (ref 22–32)
Calcium: 9.1 mg/dL (ref 8.9–10.3)
Chloride: 97 mmol/L — ABNORMAL LOW (ref 101–111)
Creatinine, Ser: 0.66 mg/dL (ref 0.44–1.00)
GFR calc Af Amer: >60 mL/min (ref >60)
GFR calc non Af Amer: >60 mL/min (ref >60)
Glucose, Bld: 105 mg/dL — ABNORMAL HIGH (ref 65–99)
Potassium: 2.8 mmol/L — ABNORMAL LOW (ref 3.5–5.1)
Sodium: 136 mmol/L (ref 135–145)

## 2017-10-13 LAB — HEPATIC FUNCTION PANEL
ALT: 27 U/L (ref 14–54)
AST: 88 U/L — ABNORMAL HIGH (ref 15–41)
Albumin: 3.5 g/dL (ref 3.5–5.0)
Alkaline Phosphatase: 121 U/L (ref 38–126)
Bilirubin, Direct: 2.1 mg/dL — ABNORMAL HIGH (ref 0.1–0.5)
Indirect Bilirubin: 3.2 mg/dL — ABNORMAL HIGH (ref 0.3–0.9)
Total Bilirubin: 5.3 mg/dL — ABNORMAL HIGH (ref 0.3–1.2)
Total Protein: 7.5 g/dL (ref 6.5–8.1)

## 2017-10-13 LAB — MAGNESIUM: Magnesium: 1.8 mg/dL (ref 1.7–2.4)

## 2017-10-13 LAB — PROTIME-INR
INR: 1.41
Prothrombin Time: 17.1 seconds — ABNORMAL HIGH (ref 11.4–15.2)

## 2017-10-13 MED ORDER — ACETAMINOPHEN 650 MG RE SUPP
650.0000 mg | Freq: Four times a day (QID) | RECTAL | Status: DC | PRN
Start: 2017-10-13 — End: 2017-10-15

## 2017-10-13 MED ORDER — TRAZODONE HCL 50 MG PO TABS
50.0000 mg | ORAL_TABLET | Freq: Every day | ORAL | Status: DC
  Administered 2017-10-13 – 2017-10-14 (×2): 50 mg via ORAL
  Filled 2017-10-13 (×2): qty 1

## 2017-10-13 MED ORDER — RIFAXIMIN 200 MG PO TABS
200.0000 mg | ORAL_TABLET | Freq: Three times a day (TID) | ORAL | Status: DC
  Administered 2017-10-13 – 2017-10-14 (×3): 200 mg via ORAL
  Filled 2017-10-13 (×6): qty 1

## 2017-10-13 MED ORDER — ONDANSETRON HCL 4 MG/2ML IJ SOLN: 4.0000 mg | Freq: Four times a day (QID) | INTRAMUSCULAR | Status: DC | PRN

## 2017-10-13 MED ORDER — HYDROMORPHONE HCL 1 MG/ML IJ SOLN
1.0000 mg | INTRAMUSCULAR | Status: DC | PRN
  Administered 2017-10-13 – 2017-10-14 (×2): 1 mg via INTRAVENOUS
  Filled 2017-10-13 (×2): qty 1

## 2017-10-13 MED ORDER — ACETAMINOPHEN 325 MG PO TABS
650.0000 mg | ORAL_TABLET | Freq: Four times a day (QID) | ORAL | Status: DC | PRN
  Administered 2017-10-14: 650 mg via ORAL
  Filled 2017-10-13: qty 2

## 2017-10-13 MED ORDER — POTASSIUM CHLORIDE 10 MEQ/100ML IV SOLN
10.0000 meq | INTRAVENOUS | Status: AC
  Administered 2017-10-13 – 2017-10-14 (×2): 10 meq via INTRAVENOUS
  Filled 2017-10-13 (×2): qty 100

## 2017-10-13 MED ORDER — LEVOTHYROXINE SODIUM 50 MCG PO TABS
50.0000 ug | ORAL_TABLET | Freq: Every day | ORAL | Status: DC
  Administered 2017-10-14: 50 ug via ORAL
  Filled 2017-10-13 (×2): qty 1

## 2017-10-13 MED ORDER — POTASSIUM CHLORIDE CRYS ER 20 MEQ PO TBCR
40.0000 meq | EXTENDED_RELEASE_TABLET | Freq: Once | ORAL | Status: AC
  Administered 2017-10-13: 40 meq via ORAL
  Filled 2017-10-13: qty 2

## 2017-10-13 MED ORDER — SODIUM CHLORIDE 0.9 % IV SOLN
INTRAVENOUS | Status: DC
  Administered 2017-10-13: 23:00:00 via INTRAVENOUS

## 2017-10-13 MED ORDER — ONDANSETRON HCL 4 MG PO TABS: 4.0000 mg | ORAL_TABLET | Freq: Four times a day (QID) | ORAL | Status: DC | PRN

## 2017-10-13 MED ORDER — SERTRALINE HCL 50 MG PO TABS
100.0000 mg | ORAL_TABLET | Freq: Every day | ORAL | Status: DC
  Administered 2017-10-13 – 2017-10-14 (×2): 100 mg via ORAL
  Filled 2017-10-13 (×2): qty 2

## 2017-10-13 MED ORDER — FENTANYL CITRATE (PF) 100 MCG/2ML IJ SOLN
50.0000 ug | Freq: Once | INTRAMUSCULAR | Status: AC
  Administered 2017-10-13: 50 ug via INTRAVENOUS
  Filled 2017-10-13: qty 2

## 2017-10-13 MED ORDER — POTASSIUM CHLORIDE 10 MEQ/100ML IV SOLN
10.0000 meq | Freq: Once | INTRAVENOUS | Status: AC
  Administered 2017-10-13: 10 meq via INTRAVENOUS
  Filled 2017-10-13: qty 100

## 2017-10-13 MED ORDER — GABAPENTIN 300 MG PO CAPS
600.0000 mg | ORAL_CAPSULE | Freq: Every day | ORAL | Status: DC
  Administered 2017-10-13 – 2017-10-14 (×2): 600 mg via ORAL
  Filled 2017-10-13: qty 6
  Filled 2017-10-13: qty 2

## 2017-10-13 NOTE — ED Notes (Signed)
Pt transported to radiology.

## 2017-10-13 NOTE — ED Provider Notes (Signed)
[facility] EMERGENCY DEPARTMENT Provider Note   CSN: [id number] Arrival date & time: 10/13/17  1532     History   Chief Complaint Chief Complaint  Patient presents with  . Fall    HPI Megan Roberson is a 61 y.o. female.  HPI  61 year old female with a history of cirrhosis and ascites presents with severe low back pain after a fall.  She also feels diffusely weak.  She estimates she fell about 5 days ago in her house when she slipped on the floor.  She states she falls often.  She fell backwards and hit both her head and her low back.  Initially had a headache but this is improving.  However she states that her low back pain is severely painful and has not improved.  She took some of her leftover Percocet from her recent elbow surgery but the pain is still severe.  She states feeling diffusely weak like this is a recurrent issue.  She can feel that her abdomen is swelling again but denies any significant abdominal pain.  She denies fevers, cough, shortness of breath, chest pain.  No urinary symptoms.  She denies any focal weakness.  She has been able to ambulate at home since this injury but it is very painful.  Past Medical History:  Diagnosis Date  . Anxiety   . Cirrhosis of liver ([place])   . Depression   . Heart murmur    "not concerned about "   . History of abdominal paracentesis   . History of blood transfusion   . Hypothyroidism   . Liver disease 2008  . Neuropathy   . Pancytopenia [facility])     Patient Active Problem List   Diagnosis Date Noted  . Cirrhosis of liver ([place]) 04/21/2017  . Anemia 04/21/2017  . History of open reduction and internal fixation (ORIF) procedure 04/21/2017  . Visit for suture removal 04/21/2017  . Left Olecranon fracture 04/21/2017  . Hypokalemia 04/21/2017    Past Surgical History:  Procedure Laterality Date  . COLONOSCOPY WITH ESOPHAGOGASTRODUODENOSCOPY (EGD) AND ESOPHAGEAL DILATION (ED)    . ESOPHAGOGASTRODUODENOSCOPY W/ BANDING     Varices  . FRACTURE SURGERY Left    x 2  . HARDWARE REMOVAL Left 09/17/2017   Procedure: HARDWARE REMOVAL LEFT OLECRANON;  Surgeon: Altamese Wilkinson, MD;  Location: [place];  Service: Orthopedics;  Laterality: Left;  . HIP SURGERY Left    from fracture- rod in palce  . KNEE SURGERY Bilateral    open incision for ligaments  . ORIF ELBOW FRACTURE Left 04/30/2017   Procedure: REVISION OPEN REDUCTION INTERNAL FIXATION (ORIF) ELBOW/OLECRANON FRACTURE;  Surgeon: Altamese Laverne, MD;  Location: [place];  Service: Orthopedics;  Laterality: Left;    OB History    No data available       Home Medications    Prior to Admission medications   Medication Sig Start Date End Date Taking? Authorizing Provider  furosemide (LASIX) 40 MG tablet Take 40 mg by mouth daily. 10/07/17  Yes [provider]  gabapentin (NEURONTIN) 600 MG tablet Take 600 mg by mouth 3 (three) times daily.    Yes [provider]  lactulose (CHRONULAC) 10 GM/15ML solution Take 20 g by mouth 3 (three) times daily. 10/06/17  Yes [provider]  levothyroxine (SYNTHROID, LEVOTHROID) 50 MCG tablet Take 50 mcg daily before breakfast by mouth.    Yes [provider]  ondansetron (ZOFRAN ODT) 4 MG disintegrating tablet Take 1 tablet (4 mg total) every  8 (eight) hours as needed by mouth for nausea or vomiting. 09/17/17  Yes Ainsley Spinner, PA-C  oxyCODONE (ROXICODONE) 5 MG immediate release tablet Take 1-2 tablets (5-10 mg total) every 8 (eight) hours as needed by mouth for severe pain. 09/17/17  Yes Ainsley Spinner, PA-C  potassium chloride (K-DUR,KLOR-CON) 10 MEQ tablet Take 10 mEq 2 (two) times daily by mouth.   Yes [provider]  rifaximin (XIFAXAN) 200 MG tablet Take 200 mg by mouth 3 (three) times daily.    Yes [provider]  sertraline (ZOLOFT) 100 MG tablet Take 100 mg at bedtime by mouth.   Yes [provider]  spironolactone (ALDACTONE) 50 MG tablet Take 100 mg by mouth daily.  10/06/17  Yes [provider]  traZODone (DESYREL) 50 MG tablet Take 50 mg at bedtime by mouth.   Yes [provider]    Family History Family History  Adopted: Yes    Social History Social History   Tobacco Use  . Smoking status: Never Smoker  . Smokeless tobacco: Never Used  Substance Use Topics  . Alcohol use: No  . Drug use: No     Allergies   Penicillins and Sulfa antibiotics   Review of Systems Review of Systems  Constitutional: Negative for fever.  Respiratory: Negative for shortness of breath.   Cardiovascular: Negative for chest pain.  Gastrointestinal: Positive for abdominal distention. Negative for abdominal pain and vomiting.  Genitourinary: Negative for dysuria.  Musculoskeletal: Positive for back pain.  Neurological: Positive for weakness and headaches. Negative for numbness.  All other systems reviewed and are negative.    Physical Exam Updated Vital Signs BP (!) 154/75   Pulse 91   Temp 99.4 F (37.4 C) (Rectal)   Resp 15   Ht 5' 7"  (1.702 m)   Wt 63.5 kg (140 lb)   SpO2 94%   BMI 21.93 kg/m   Physical Exam  Constitutional: She is oriented to person, place, and time. She appears well-developed. She appears cachectic.  Sleepy but awake, answers all questions appropriately  HENT:  Head: Normocephalic.  Right Ear: External ear normal.  Left Ear: External ear normal.  Nose: Nose normal.  Knot to occipital scalp, mild tenderness  Eyes: EOM are normal. Pupils are equal, round, and reactive to light. Right eye exhibits no discharge. Left eye exhibits no discharge.  Neck: Neck supple.  Cardiovascular: Normal rate, regular rhythm and normal heart sounds.  Pulmonary/Chest: Effort normal and breath sounds normal. She has no wheezes. She has no rales.  Abdominal: Soft. She exhibits distension (mild). There is no tenderness.  Musculoskeletal:  LUE with wrap from prior surgery. RUE with abrasion to right forearm  Neurological: She  is alert and oriented to person, place, and time.  CN 3-12 grossly intact. 5/5 strength in all 4 extremities though with poor effort. Grossly normal sensation.  Skin: Skin is warm and dry.  Nursing note and vitals reviewed.    ED Treatments / Results  Labs (all labs ordered are listed, but only abnormal results are displayed) Labs Reviewed  CBC WITH DIFFERENTIAL/PLATELET - Abnormal; Notable for the following components:      Result Value   RDW 20.1 (*)    Platelets 50 (*)    Monocytes Absolute 1.1 (*)    All other components within normal limits  BASIC METABOLIC PANEL - Abnormal; Notable for the following components:   Potassium 2.8 (*)    Chloride 97 (*)    Glucose, Bld 105 (*)  All other components within normal limits  HEPATIC FUNCTION PANEL - Abnormal; Notable for the following components:   AST 88 (*)    Total Bilirubin 5.3 (*)    Bilirubin, Direct 2.1 (*)    Indirect Bilirubin 3.2 (*)    All other components within normal limits  URINALYSIS, ROUTINE W REFLEX MICROSCOPIC - Abnormal; Notable for the following components:   Color, Urine AMBER (*)    APPearance HAZY (*)    Hgb urine dipstick MODERATE (*)    Bilirubin Urine SMALL (*)    Protein, ur 30 (*)    Bacteria, UA RARE (*)    Squamous Epithelial / LPF 0-5 (*)    All other components within normal limits  PROTIME-INR - Abnormal; Notable for the following components:   Prothrombin Time 17.1 (*)    All other components within normal limits  MAGNESIUM    EKG  EKG Interpretation  Date/Time:  Wednesday October 13 2017 18:46:07 EST Ventricular Rate:  94 PR Interval:    QRS Duration: 105 QT Interval:  472 QTC Calculation: 591 R Axis:   80 Text Interpretation:  Sinus rhythm Prolonged QT interval prolonged QT new since June 2018 Confirmed by Sherwood Gambler [id number]) on 10/13/2017 6:58:17 PM       Radiology Dg Chest 2 View  Result Date: 10/13/2017 CLINICAL DATA:  Multiple falls over the past 2 days.   Weakness. EXAM: CHEST  2 VIEW COMPARISON:  None. FINDINGS: The heart size and mediastinal contours are within normal limits. Lung volumes are slightly low without pneumonic consolidation or CHF. No effusion or pneumothorax. Chronic left fifth and sixth rib fractures. No acute osseous abnormality of the bony thorax. IMPRESSION: No active cardiopulmonary disease. Electronically Signed   By: Ashley Royalty M.D.   On: 10/13/2017 18:51   Dg Lumbar Spine Complete  Result Date: 10/13/2017 CLINICAL DATA:  Patient with multiple falls over the past several days due to weakness. Pain in the lower back. EXAM: LUMBAR SPINE - COMPLETE 4+ VIEW COMPARISON:  None FINDINGS: Age-indeterminate superior endplate compression of L1 with at least 25% height loss. Schmorl's nodes noted of the superior endplates of U72 and Z36 best seen on the lateral projection. No pars defects. Multilevel degenerative facet arthropathy of the lumbar spine. There is dextroscoliosis of the thoracic spine. Right upper quadrant calcifications are noted either representing gallstones or renal calculi. Bowel gas pattern is unremarkable. Sacroiliac joints are maintained. IMPRESSION: 1. Age-indeterminate superior endplate compression of L1 with at least 25% height loss and resultant dextroconvex curvature of the dorsal spine at the thoracolumbar junction. No retropulsion is identified. Central superior endplate compressions of T10 and T12 are also noted on the lateral view. 2. Right upper quadrant abdominal calcifications may reflect gallstones or renal calculi. Electronically Signed   By: Ashley Royalty M.D.   On: 10/13/2017 18:48   Dg Sacrum/coccyx  Result Date: 10/13/2017 CLINICAL DATA:  Lower back pain after fall. EXAM: SACRUM AND COCCYX - 2+ VIEW COMPARISON:  None. FINDINGS: Slightly buckled appearance of the S2 segment of the sacrum along its ventral cortex. This may represent a subtle sacral fracture. Remainder of the study is unremarkable. The patient  is status post left femoral nail fixation. IMPRESSION: There is a slight buckled appearance of the ventral cortex of the S2 segment of the sacrum. This has the appearance of a subtle fracture. Electronically Signed   By: Ashley Royalty M.D.   On: 10/13/2017 18:50   Dg Elbow 2 Views Left  Result Date: 10/13/2017 CLINICAL DATA:  Recent fall. History of prior olecranon fracture with prior surgery removal of hardware. EXAM: LEFT ELBOW - 2 VIEW COMPARISON:  None. FINDINGS: No acute fracture or dislocation identified. No evidence of joint effusion. Posttraumatic degenerative disease noted as well as proliferative disease involving the olecranon and distal humerus. No bony destruction. IMPRESSION: No acute fracture.  Posttraumatic degenerative disease of the elbow. Electronically Signed   By: Aletta Edouard M.D.   On: 10/13/2017 20:14   Ct Head Wo Contrast  Result Date: 10/13/2017 CLINICAL DATA:  Multiple recent falls EXAM: CT HEAD WITHOUT CONTRAST TECHNIQUE: Contiguous axial images were obtained from the base of the skull through the vertex without intravenous contrast. COMPARISON:  None. FINDINGS: Brain: No mass lesion, intraparenchymal hemorrhage or extra-axial collection. No evidence of acute cortical infarct. Brain parenchyma and CSF-containing spaces are normal for age. Vascular: No hyperdense vessel or unexpected calcification. Skull: Normal visualized skull base, calvarium and extracranial soft tissues. Sinuses/Orbits: No sinus fluid levels or advanced mucosal thickening. No mastoid effusion. Normal orbits. IMPRESSION: Normal head CT. Electronically Signed   By: Ulyses Jarred M.D.   On: 10/13/2017 18:46    Procedures Procedures (including critical care time)  Medications Ordered in ED Medications  fentaNYL (SUBLIMAZE) injection 50 mcg (50 mcg Intravenous Given 10/13/17 1843)  potassium chloride SA (K-DUR,KLOR-CON) CR tablet 40 mEq (40 mEq Oral Given 10/13/17 1843)  potassium chloride 10 mEq in 100  mL IVPB (0 mEq Intravenous Stopped 10/13/17 2055)     Initial Impression / Assessment and Plan / ED Course  I have reviewed the triage vital signs and the nursing notes.  Pertinent labs & imaging results that were available during my care of the patient were reviewed by me and considered in my medical decision making (see chart for details).     Patient's workup shows hypokalemia as well as a prolonged QTC of 591.  However her vital signs are unremarkable besides moderate hypertension.  Has a max temp of 99.4 but no fevers.  No abdominal pain or distention to suggest significant ascites.  Hypokalemia probably from diuretic use.  Given the prolonged QT, she will knee replacement while on the monitor and observation.  Magnesium is benign.  As for her fall, she does have an L1 compression fracture and S2 segment fracture.  No acute neuro deficits.  Admit to the hospitalist.  Final Clinical Impressions(s) / ED Diagnoses   Final diagnoses:  Hypokalemia  Closed compression fracture of first lumbar vertebra, initial encounter [facility])    ED Discharge Orders    None       Sherwood Gambler, MD 10/13/17 2109

## 2017-10-13 NOTE — H&P (Signed)
TRH H&P    Patient Demographics:    Megan Roberson, is a 61 y.o. female  MRN: [id number]  DOB - [birth year]  Admit Date - 10/13/2017  Referring MD/NP/PA:  Dr. Regenia Skeeter*  Outpatient Primary MD for the patient is Patient, No Pcp Per  Patient coming from:  Home  Chief Complaint  Patient presents with  . Fall      HPI:    Megan Roberson  is a 61 y.o. female,.. With history of alcohol liver disease, liver cirrhosis, depression, hypothyroidism, neuropathy came to hospital with back pain.  Patient says that she fell at home about 5 days ago, she slipped on the floor.  Patient says that she fell backwards hit her head and low back.  He had left elbow surgery.  She recently had paracentesis for ascites. She denies passing out. No nausea vomiting or diarrhea. No fever or dysuria. No chest pain or shortness of breath.  In the ED, x-ray of the lumbosacral spine showed age indeterminate superior endplate compression of L1 with at least 25% height loss and resultant dextroconvex curvature of the dorsal spine at the thoracolumbar junction.  X-ray of the sacrum and coccyx showed slight buckled appearance of the ventral cortex of S2 segment of the sacrum.  This has appearance of subtle fracture. Lab work showed hypokalemia with potassium of 2.8. EKG showed prolonged QTc interval 591.   Review of systems:      All other systems reviewed and are negative.   With Past History of the following :    Past Medical History:  Diagnosis Date  . Anxiety   . Cirrhosis of liver ([place])   . Depression   . Heart murmur    "not concerned about "   . History of abdominal paracentesis   . History of blood transfusion   . Hypothyroidism   . Liver disease 2008  . Neuropathy   . Pancytopenia [facility])       Past Surgical History:  Procedure Laterality Date  . COLONOSCOPY WITH ESOPHAGOGASTRODUODENOSCOPY (EGD) AND ESOPHAGEAL  DILATION (ED)    . ESOPHAGOGASTRODUODENOSCOPY W/ BANDING     Varices  . FRACTURE SURGERY Left    x 2  . HARDWARE REMOVAL Left 09/17/2017   Procedure: HARDWARE REMOVAL LEFT OLECRANON;  Surgeon: Altamese Dutton, MD;  Location: [facility];  Service: Orthopedics;  Laterality: Left;  . HIP SURGERY Left    from fracture- rod in palce  . KNEE SURGERY Bilateral    open incision for ligaments  . ORIF ELBOW FRACTURE Left 04/30/2017   Procedure: REVISION OPEN REDUCTION INTERNAL FIXATION (ORIF) ELBOW/OLECRANON FRACTURE;  Surgeon: Altamese Fairmount, MD;  Location: [facility];  Service: Orthopedics;  Laterality: Left;      Social History:      Social History   Tobacco Use  . Smoking status: Never Smoker  . Smokeless tobacco: Never Used  Substance Use Topics  . Alcohol use: No       Family History :     Family History  Adopted: Yes  Home Medications:   Prior to Admission medications   Medication Sig Start Date End Date Taking? Authorizing Provider  furosemide (LASIX) 40 MG tablet Take 40 mg by mouth daily. 10/07/17  Yes [provider]  gabapentin (NEURONTIN) 600 MG tablet Take 600 mg at bedtime by mouth.   Yes [provider]  lactulose (CHRONULAC) 10 GM/15ML solution Take 20 g by mouth 3 (three) times daily. 10/06/17  Yes [provider]  levothyroxine (SYNTHROID, LEVOTHROID) 50 MCG tablet Take 50 mcg daily before breakfast by mouth.    Yes [provider]  ondansetron (ZOFRAN ODT) 4 MG disintegrating tablet Take 1 tablet (4 mg total) every 8 (eight) hours as needed by mouth for nausea or vomiting. 09/17/17  Yes Ainsley Spinner, PA-C  oxyCODONE (ROXICODONE) 5 MG immediate release tablet Take 1-2 tablets (5-10 mg total) every 8 (eight) hours as needed by mouth for severe pain. 09/17/17  Yes Ainsley Spinner, PA-C  potassium chloride (K-DUR,KLOR-CON) 10 MEQ tablet Take 10 mEq 2 (two) times daily by mouth.   Yes [provider]  rifaximin (XIFAXAN) 200 MG  tablet Take 200 mg by mouth 3 (three) times daily.    Yes [provider]  sertraline (ZOLOFT) 100 MG tablet Take 100 mg at bedtime by mouth.   Yes [provider]  spironolactone (ALDACTONE) 50 MG tablet Take 100 mg by mouth daily. 10/06/17  Yes [provider]  traZODone (DESYREL) 50 MG tablet Take 50 mg at bedtime by mouth.   Yes [provider]     Allergies:     Allergies  Allergen Reactions  . Penicillins Swelling    FACIAL SWELLING  PATIENT HAS HAD A PCN REACTION WITH IMMEDIATE RASH, FACIAL/TONGUE/THROAT SWELLING, SOB, OR LIGHTHEADEDNESS WITH HYPOTENSION:  #  #  #  YES  #  #  #   Has patient had a PCN reaction causing severe rash involving mucus membranes or skin necrosis: No Has patient had a PCN reaction that required hospitalization: No Has patient had a PCN reaction occurring within the last 10 years: No If all of the above answers are "NO", then may proceed with Cephalosporin u  . Sulfa Antibiotics Itching and Rash     Physical Exam:   Vitals  Blood pressure (!) 154/75, pulse 91, temperature 99.4 F (37.4 C), temperature source Rectal, resp. rate 15, height 5' 7"  (1.702 m), weight 63.5 kg (140 lb), SpO2 94 %.  1.  General: Appears in no acute distress  2. Psychiatric:  Intact judgement and  insight, awake alert, oriented x 3.  3. Neurologic: No focal neurological deficits, all cranial nerves intact.Strength 5/5 all 4 extremities, sensation intact all 4 extremities, plantars down going.  4. Eyes :  anicteric sclerae, moist conjunctivae with no lid lag. PERRLA.  5. ENMT:  Oropharynx clear with moist mucous membranes and good dentition  6. Neck:  supple, no cervical lymphadenopathy appriciated, No thyromegaly  7. Respiratory : Normal respiratory effort, good air movement bilaterally,clear to  auscultation bilaterally  8. Cardiovascular : RRR, no gallops, rubs or murmurs, no leg edema  9. Gastrointestinal:  Positive  bowel sounds, abdomen soft, non-tender to palpation,no hepatosplenomegaly, no rigidity or guarding       10. Skin:  No cyanosis, normal texture and turgor, no rash, lesions or ulcers  11.Musculoskeletal:  Left elbow and dressing, range of motion limited    Data Review:    CBC Recent Labs  Lab 10/13/17 1640  WBC 6.9  HGB 14.0  HCT 43.7  PLT 50*  MCV 96.3  MCH 30.8  MCHC 32.0  RDW 20.1*  LYMPHSABS 1.1  MONOABS 1.1*  EOSABS 0.0  BASOSABS 0.0   ------------------------------------------------------------------------------------------------------------------  Chemistries  Recent Labs  Lab 10/13/17 1640 10/13/17 1659 10/13/17 1744  NA 136  --   --   K 2.8*  --   --   CL 97*  --   --   CO2 28  --   --   GLUCOSE 105*  --   --   BUN 10  --   --   CREATININE 0.66  --   --   CALCIUM 9.1  --   --   MG  --  1.8  --   AST  --   --  88*  ALT  --   --  27  ALKPHOS  --   --  121  BILITOT  --   --  5.3*   ------------------------------------------------------------------------------------------------------------------  ------------------------------------------------------------------------------------------------------------------ GFR: Estimated Creatinine Clearance: 73.6 mL/min (by C-G formula based on SCr of 0.66 mg/dL). Liver Function Tests: Recent Labs  Lab 10/13/17 1744  AST 88*  ALT 27  ALKPHOS 121  BILITOT 5.3*  PROT 7.5  ALBUMIN 3.5   No results for input(s): LIPASE, AMYLASE in the last 168 hours. No results for input(s): AMMONIA in the last 168 hours. Coagulation Profile: Recent Labs  Lab 10/13/17 1744  INR 1.41    --------------------------------------------------------------------------------------------------------------- Urine analysis: No results found for: COLORURINE, APPEARANCEUR, LABSPEC, PHURINE, GLUCOSEU, HGBUR, BILIRUBINUR, KETONESUR, PROTEINUR, UROBILINOGEN, NITRITE, LEUKOCYTESUR    Imaging Results:    Dg Chest 2 View  Result  Date: 10/13/2017 CLINICAL DATA:  Multiple falls over the past 2 days.  Weakness. EXAM: CHEST  2 VIEW COMPARISON:  None. FINDINGS: The heart size and mediastinal contours are within normal limits. Lung volumes are slightly low without pneumonic consolidation or CHF. No effusion or pneumothorax. Chronic left fifth and sixth rib fractures. No acute osseous abnormality of the bony thorax. IMPRESSION: No active cardiopulmonary disease. Electronically Signed   By: Ashley Royalty M.D.   On: 10/13/2017 18:51   Dg Lumbar Spine Complete  Result Date: 10/13/2017 CLINICAL DATA:  Patient with multiple falls over the past several days due to weakness. Pain in the lower back. EXAM: LUMBAR SPINE - COMPLETE 4+ VIEW COMPARISON:  None FINDINGS: Age-indeterminate superior endplate compression of L1 with at least 25% height loss. Schmorl's nodes noted of the superior endplates of I14 and E31 best seen on the lateral projection. No pars defects. Multilevel degenerative facet arthropathy of the lumbar spine. There is dextroscoliosis of the thoracic spine. Right upper quadrant calcifications are noted either representing gallstones or renal calculi. Bowel gas pattern is unremarkable. Sacroiliac joints are maintained. IMPRESSION: 1. Age-indeterminate superior endplate compression of L1 with at least 25% height loss and resultant dextroconvex curvature of the dorsal spine at the thoracolumbar junction. No retropulsion is identified. Central superior endplate compressions of T10 and T12 are also noted on the lateral view. 2. Right upper quadrant abdominal calcifications may reflect gallstones or renal calculi. Electronically Signed   By: Ashley Royalty M.D.   On: 10/13/2017 18:48   Dg Sacrum/coccyx  Result Date: 10/13/2017 CLINICAL DATA:  Lower back pain after fall. EXAM: SACRUM AND COCCYX - 2+ VIEW COMPARISON:  None. FINDINGS: Slightly buckled appearance of the S2 segment of the sacrum along its ventral cortex. This may represent a  subtle sacral fracture. Remainder of the study is unremarkable. The patient is status  post left femoral nail fixation. IMPRESSION: There is a slight buckled appearance of the ventral cortex of the S2 segment of the sacrum. This has the appearance of a subtle fracture. Electronically Signed   By: Ashley Royalty M.D.   On: 10/13/2017 18:50   Ct Head Wo Contrast  Result Date: 10/13/2017 CLINICAL DATA:  Multiple recent falls EXAM: CT HEAD WITHOUT CONTRAST TECHNIQUE: Contiguous axial images were obtained from the base of the skull through the vertex without intravenous contrast. COMPARISON:  None. FINDINGS: Brain: No mass lesion, intraparenchymal hemorrhage or extra-axial collection. No evidence of acute cortical infarct. Brain parenchyma and CSF-containing spaces are normal for age. Vascular: No hyperdense vessel or unexpected calcification. Skull: Normal visualized skull base, calvarium and extracranial soft tissues. Sinuses/Orbits: No sinus fluid levels or advanced mucosal thickening. No mastoid effusion. Normal orbits. IMPRESSION: Normal head CT. Electronically Signed   By: Ulyses Jarred M.D.   On: 10/13/2017 18:46    My personal review of EKG: Rhythm NSR, QTC 591   Assessment & Plan:    Active Problems:   Hypokalemia   1. Status post fall-imaging shows L1 and S2 sacral fracture.  Will start Dilaudid 1 mg IV every 4 hours as needed.  PT eval once patient's pain is well controlled. 2. Hypokalemia-potassium is 2.8 likely from diuretics patient takes at home.  Will replace potassium with IV KCl 10 mg x3.  Check BMP in a.m. 3. QTC prolongation-has QTC prolonged at 591 seen on EKG.  Magnesium is 1.8.  Replace potassium as above.  Monitor closely on telemetry.  Repeat EKG in a.m. 4. Alcohol liver cirrhosis-patient stopped drinking alcohol 10 years ago.  Recent paracentesis.  No significant ascites found on examination.  Lasix currently on hold due to hypokalemia.  Continue  rifaximin 5. Hypothyroidism-continue Synthroid 6. Thrombocytopenia-count 50,000, from liver cirrhosis.  Check CBC in a.m. 7. Recent elbow surgery-check x-ray of the elbow, since patient fell 5 days ago. 8. Depression-Zoloft, trazodone   DVT Prophylaxis-   SCDs   AM Labs Ordered, also please review Full Orders  Family Communication: Admission, patients condition and plan of care including tests being ordered have been discussed with the patient  who indicate understanding and agree with the plan and Code Status.  Code Status: Full code  Admission status:  Observation*    Time spent in minutes :  60 minutes   Oswald Hillock M.D on 10/13/2017 at 7:42 PM  Between 7am to 7pm - Pager - [phone]. After 7pm go to [email or web address] - password [facility]  [facility] - Office  [phone]

## 2017-10-13 NOTE — ED Triage Notes (Signed)
Pt c/o multiple falls over the past few days due to being weak, pt reports that she was at [facility] a week ago after she had fluid drawn from abd area. C/o pain to back, did hit her head, has abrasion noted to rigth forearm, requesting stiches to be taken out of left elbow from recent surgery, had follow up appointment scheduled for today

## 2017-10-14 DIAGNOSIS — E876 Hypokalemia: Secondary | ICD-10-CM | POA: Diagnosis not present

## 2017-10-14 DIAGNOSIS — W19XXXA Unspecified fall, initial encounter: Secondary | ICD-10-CM

## 2017-10-14 DIAGNOSIS — S32010A Wedge compression fracture of first lumbar vertebra, initial encounter for closed fracture: Secondary | ICD-10-CM | POA: Diagnosis not present

## 2017-10-14 LAB — CBC
HCT: 38.2 % (ref 36.0–46.0)
Hemoglobin: 11.7 g/dL — ABNORMAL LOW (ref 12.0–15.0)
MCH: 30 pg (ref 26.0–34.0)
MCHC: 30.6 g/dL (ref 30.0–36.0)
MCV: 97.9 fL (ref 78.0–100.0)
Platelets: 39 10*3/uL — ABNORMAL LOW (ref 150–400)
RBC: 3.9 MIL/uL (ref 3.87–5.11)
RDW: 20.3 % — ABNORMAL HIGH (ref 11.5–15.5)
WBC: 6 10*3/uL (ref 4.0–10.5)

## 2017-10-14 LAB — COMPREHENSIVE METABOLIC PANEL
ALT: 23 U/L (ref 14–54)
AST: 59 U/L — ABNORMAL HIGH (ref 15–41)
Albumin: 2.9 g/dL — ABNORMAL LOW (ref 3.5–5.0)
Alkaline Phosphatase: 116 U/L (ref 38–126)
Anion gap: 7 (ref 5–15)
BUN: 10 mg/dL (ref 6–20)
CO2: 28 mmol/L (ref 22–32)
Calcium: 8.6 mg/dL — ABNORMAL LOW (ref 8.9–10.3)
Chloride: 103 mmol/L (ref 101–111)
Creatinine, Ser: 0.72 mg/dL (ref 0.44–1.00)
GFR calc Af Amer: >60 mL/min (ref >60)
GFR calc non Af Amer: >60 mL/min (ref >60)
Glucose, Bld: 110 mg/dL — ABNORMAL HIGH (ref 65–99)
Potassium: 3 mmol/L — ABNORMAL LOW (ref 3.5–5.1)
Sodium: 138 mmol/L (ref 135–145)
Total Bilirubin: 3.9 mg/dL — ABNORMAL HIGH (ref 0.3–1.2)
Total Protein: 6 g/dL — ABNORMAL LOW (ref 6.5–8.1)

## 2017-10-14 MED ORDER — KETOROLAC TROMETHAMINE 15 MG/ML IJ SOLN
15.0000 mg | Freq: Three times a day (TID) | INTRAMUSCULAR | Status: DC | PRN
  Administered 2017-10-14: 15 mg via INTRAVENOUS
  Filled 2017-10-14 (×2): qty 1

## 2017-10-14 NOTE — Care Management Obs Status (Signed)
[place] NOTIFICATION   Patient Details  Name: Megan Roberson MRN: [id number] Date of Birth: [birth year]   Medicare Observation Status Notification Given:  Yes    Sherald Barge, RN 10/14/2017, 11:40 AM

## 2017-10-14 NOTE — Evaluation (Signed)
Physical Therapy Evaluation Patient Details Name: Megan Roberson MRN: [id number] DOB: 10-Apr-1956 Today's Date: 10/14/2017   History of Present Illness  Megan Roberson  is a 61 y.o. female,.. With history of alcohol liver disease, liver cirrhosis, depression, hypothyroidism, neuropathy came to hospital with back pain.  Patient says that she fell at home about 5 days ago, she slipped on the floor.  Patient says that she fell backwards hit her head and low back.  He had left elbow surgery.  She recently had paracentesis for ascites.    Clinical Impression  Patient presents with ace wrap to Left elbow, abrasion to right lateral forearm and states that RUE abrasion due to recent fall.  Patient demonstrates slow labored movement for sitting up and during gait training limited secondary to c/o fatigue and requested to go back to bed after walking.  Patient will benefit from continued physical therapy in hospital and recommended venue below to increase strength, balance, endurance for safe ADLs and gait.    Follow Up Recommendations Home health PT;Supervision for mobility/OOB    Equipment Recommendations  None recommended by PT    Recommendations for Other Services       Precautions / Restrictions Precautions Precautions: Fall Restrictions Weight Bearing Restrictions: No      Mobility  Bed Mobility Overal bed mobility: Needs Assistance Bed Mobility: Supine to Sit;Sit to Supine     Supine to sit: Supervision Sit to supine: Supervision   General bed mobility comments: slow labored movement, frequent rest breaks  Transfers Overall transfer level: Needs assistance Equipment used: Rolling walker (2 wheeled) Transfers: Sit to/from [facility] Sit to Stand: Min guard Stand pivot transfers: Min guard       General transfer comment: slow labored movemet  Ambulation/Gait Ambulation/Gait assistance: Min guard Ambulation Distance (Feet): 20 Feet Assistive device:  Rolling walker (2 wheeled) Gait Pattern/deviations: Decreased step length - right;Decreased step length - left;Decreased stride length   Gait velocity interpretation: Below normal speed for age/gender General Gait Details: slow labored cadence without loss of balance, limited mostly due to c/o fatigue  Stairs            Wheelchair Mobility    Modified Rankin (Stroke Patients Only)       Balance                                             Pertinent Vitals/Pain Pain Assessment: Faces Faces Pain Scale: Hurts even more Pain Location: low back Pain Descriptors / Indicators: Aching;Discomfort;Shooting Pain Intervention(s): Limited activity within patient's tolerance;Monitored during session    [place] expects to be discharged to:: Private residence Living Arrangements: Spouse/significant other Available Help at Discharge: Family(her sister) Type of Home: House Home Access: Level entry     Home Layout: One level Home Equipment: [place] - single point;Shower seat;Bedside commode;Wheelchair - manual(hemi-walker) Additional Comments: Patient states her husband is in jail, if he does not get out soon, will stay with her sister    Prior Function Level of Independence: Independent with assistive device(s)         Comments: uses Hemi-walker at home     Hand Dominance        Extremity/Trunk Assessment   Upper Extremity Assessment Upper Extremity Assessment: Generalized weakness    Lower Extremity Assessment Lower Extremity Assessment: Generalized weakness    Cervical / Trunk Assessment Cervical /  Trunk Assessment: Normal  Communication   Communication: No difficulties  Cognition Arousal/Alertness: Awake/alert Behavior During Therapy: WFL for tasks assessed/performed Overall Cognitive Status: Within Functional Limits for tasks assessed                                        General Comments       Exercises     Assessment/Plan    PT Assessment Patient needs continued PT services  PT Problem List Decreased strength;Decreased activity tolerance;Decreased balance;Decreased mobility       PT Treatment Interventions Gait training;Stair training;Functional mobility training;Therapeutic activities;Therapeutic exercise;Patient/family education    PT Goals (Current goals can be found in the Care Plan section)  Acute Rehab PT Goals Patient Stated Goal: return home with her sister to help, if her huaband available PT Goal Formulation: With patient Time For Goal Achievement: 10/18/17 Potential to Achieve Goals: Good    Frequency Min 3X/week   Barriers to discharge        Co-evaluation               AM-PAC PT "6 Clicks" Daily Activity  Outcome Measure Difficulty turning over in bed (including adjusting bedclothes, sheets and blankets)?: A Little Difficulty moving from lying on back to sitting on the side of the bed? : A Little Difficulty sitting down on and standing up from a chair with arms (e.g., wheelchair, bedside commode, etc,.)?: A Little Help needed moving to and from a bed to chair (including a wheelchair)?: A Little Help needed walking in hospital room?: A Little Help needed climbing 3-5 steps with a railing? : A Little 6 Click Score: 18    End of Session   Activity Tolerance: Patient tolerated treatment well;Patient limited by fatigue Patient left: in bed;with call bell/phone within reach Nurse Communication: Mobility status PT Visit Diagnosis: Unsteadiness on feet (R26.81);Other abnormalities of gait and mobility (R26.89);Muscle weakness (generalized) (M62.81)    Time: 9562-1308 PT Time Calculation (min) (ACUTE ONLY): 24 min   Charges:   PT Evaluation $PT Eval Moderate Complexity: 1 Mod PT Treatments $Therapeutic Activity: 23-37 mins   PT G Codes:   PT G-Codes **NOT FOR INPATIENT CLASS** Functional Assessment Tool Used: AM-PAC 6 Clicks Basic  Mobility Functional Limitation: Mobility: Walking and moving around Mobility: Walking and Moving Around Current Status (M5784): At least 40 percent but less than 60 percent impaired, limited or restricted Mobility: Walking and Moving Around Goal Status [phone]): At least 40 percent but less than 60 percent impaired, limited or restricted Mobility: Walking and Moving Around Discharge Status [phone]): At least 40 percent but less than 60 percent impaired, limited or restricted    2:20 PM, 10/14/17 Lonell Grandchild, MPT Physical Therapist with [facility] [phone] office [phone] mobile phone

## 2017-10-14 NOTE — Care Management Note (Signed)
Case Management Note  Patient Details  Name: Megan Roberson MRN: [id number] Date of Birth: [birth year]  Subjective/Objective:           Pt admitted from home with hypokalemia. Pt very drowsy during assessment today. She had recently gotten pain medication. Per pt she lives alone, husband is in prison, has 9 months left on his sentence, she is trying to get him out sooner. She has a cane, walker, WC, shower stool and 3 in 1 pta but was not using any devices pta. Her PCP is Leanord Asal. She is planning to ask her sister if she can stay with her at [place]. Pt gave this CM permission to contact her sister to verify information d/t her drowsiness. CM called pt's sister, Lattie Haw, who verifies that pt lives alone, her husband is in jail they are trying to get him released early so pt will have someone at home with her. Pt was recenlty discharged from [facility] after having surgery on her arm. Sister took her home, have back the next day and took pt grocery shopping and to get her medications. Pt moved well during shopping trip. 1-2 days later pt told sister she had fallen, a couple days later pt tells sister she is in too much pain to move. Sister states she works full time, lives in [place] and says she can not stay with patient nor can the patient stay with her. CM discusses HH's recommendations for PT. We discuss having [facility] RN, PT and aid referred to maximize visits to pt's home during the week. Pt's sister states she can visit 2-3 times a week to help with light housework and shopping. She can make sure pt has easy meals to prepare. Pt has chosen [facility] from choice of [facility] providers. She is aware that [facility] has 48 hrs to make first visit.          Action/Plan: [place], [facility] rep, aware of referral and will pull pt info from chart. Per MD pt will DC today. [facility] aware.   Expected Discharge Date:     10/14/2017             Expected Discharge Plan:  [place]  In-House Referral:   NA  Post Acute Care Choice:  Home Health Choice offered to:  Patient  HH Arranged:  RN, PT, Nurse's Aide [place] Agency:  [facility]  Status of Service:  Completed, signed off   Sherald Barge, RN 10/14/2017, 3:36 PM

## 2017-10-14 NOTE — Plan of Care (Signed)
  Acute Rehab PT Goals(only PT should resolve) Pt Will Go Supine/Side To Sit 10/14/2017 1422 - Progressing by Lonell Grandchild, PT Flowsheets Taken 10/14/2017 1422  Pt will go Supine/Side to Sit with supervision Patient Will Transfer Sit To/From Stand 10/14/2017 1422 - Progressing by Lonell Grandchild, PT Flowsheets Taken 10/14/2017 1422  Patient will transfer sit to/from stand with supervision Pt Will Transfer Bed To Chair/Chair To Bed 10/14/2017 1422 - Progressing by Lonell Grandchild, PT Flowsheets Taken 10/14/2017 1422  Pt will Transfer Bed to Chair/Chair to Bed with supervision Pt Will Ambulate 10/14/2017 1422 - Progressing by Lonell Grandchild, PT Flowsheets Taken 10/14/2017 1422  Pt will Ambulate 50 feet;with supervision;with other equipment (comment);with rolling walker Note Hemi-walker  2:24 PM, 10/14/17 Lonell Grandchild, MPT Physical Therapist with [facility] [phone] office [phone] mobile phone

## 2017-10-14 NOTE — Discharge Summary (Signed)
Physician Discharge Summary  Megan Roberson UYQ:[id number] DOB: [birth year] DOA: 10/13/2017  PCP: Patient, No Pcp Per  Admit date: 10/13/2017 Discharge date: 10/14/2017  Time spent: 45 minutes  Recommendations for Outpatient Follow-up:  -Will be discharged home today. -Advised to follow-up with PCP in 2 weeks. -Home health services will be arranged.  Discharge Diagnoses:  Active Problems:   Hypokalemia   Discharge Condition: Stable and improved  Filed Weights   10/13/17 1607 10/13/17 2200  Weight: 63.5 kg (140 lb) 60.2 kg (132 lb 11.5 oz)    History of present illness:  As per Dr. Darrick Meigs on 12/12: Megan Roberson  is a 61 y.o. female,.. With history of alcohol liver disease, liver cirrhosis, depression, hypothyroidism, neuropathy came to hospital with back pain.  Patient says that she fell at home about 5 days ago, she slipped on the floor.  Patient says that she fell backwards hit her head and low back.  He had left elbow surgery.  She recently had paracentesis for ascites. She denies passing out. No nausea vomiting or diarrhea. No fever or dysuria. No chest pain or shortness of breath.  In the ED, x-ray of the lumbosacral spine showed age indeterminate superior endplate compression of L1 with at least 25% height loss and resultant dextroconvex curvature of the dorsal spine at the thoracolumbar junction.  X-ray of the sacrum and coccyx showed slight buckled appearance of the ventral cortex of S2 segment of the sacrum.  This has appearance of subtle fracture. Lab work showed hypokalemia with potassium of 2.8. EKG showed prolonged QTc interval 591.    Hospital Course:   L1 and S2 fracture -Status post mechanical fall. -Has been assessed by PT with recommendations for home health therapy. -Continue oxycodone that she takes at home. -Pain has been relatively well controlled.  Alcoholic liver cirrhosis -Patient stopped drinking alcohol about 10 years ago, continue  Lasix, spironolactone, continue rifaximin.  Hypothyroidism -Continue Synthroid.  Thrombocytopenia -Due to cirrhosis, at baseline of around 30-50,000.  Depression -Zoloft, trazodone.  Procedures:  None   Consultations:  None  Discharge Instructions  Discharge Instructions    Diet - low sodium heart healthy   Complete by:  As directed    Increase activity slowly   Complete by:  As directed      Allergies as of 10/14/2017      Reactions   Penicillins Swelling   FACIAL SWELLING PATIENT HAS HAD A PCN REACTION WITH IMMEDIATE RASH, FACIAL/TONGUE/THROAT SWELLING, SOB, OR LIGHTHEADEDNESS WITH HYPOTENSION:  #  #  #  YES  #  #  #   Has patient had a PCN reaction causing severe rash involving mucus membranes or skin necrosis: No Has patient had a PCN reaction that required hospitalization: No Has patient had a PCN reaction occurring within the last 10 years: No If all of the above answers are "NO", then may proceed with Cephalosporin u   Sulfa Antibiotics Itching, Rash      Medication List    TAKE these medications   furosemide 40 MG tablet Commonly known as:  LASIX Take 40 mg by mouth daily.   gabapentin 600 MG tablet Commonly known as:  NEURONTIN Take 600 mg by mouth 3 (three) times daily.   lactulose 10 GM/15ML solution Commonly known as:  CHRONULAC Take 20 g by mouth 3 (three) times daily.   levothyroxine 50 MCG tablet Commonly known as:  SYNTHROID, LEVOTHROID Take 50 mcg daily before breakfast by mouth.   ondansetron 4 MG  disintegrating tablet Commonly known as:  ZOFRAN ODT Take 1 tablet (4 mg total) every 8 (eight) hours as needed by mouth for nausea or vomiting.   oxyCODONE 5 MG immediate release tablet Commonly known as:  ROXICODONE Take 1-2 tablets (5-10 mg total) every 8 (eight) hours as needed by mouth for severe pain.   potassium chloride 10 MEQ tablet Commonly known as:  K-DUR,KLOR-CON Take 10 mEq 2 (two) times daily by mouth.   rifaximin 200 MG  tablet Commonly known as:  XIFAXAN Take 200 mg by mouth 3 (three) times daily.   sertraline 100 MG tablet Commonly known as:  ZOLOFT Take 100 mg at bedtime by mouth.   spironolactone 50 MG tablet Commonly known as:  ALDACTONE Take 100 mg by mouth daily.   traZODone 50 MG tablet Commonly known as:  DESYREL Take 50 mg at bedtime by mouth.      Allergies  Allergen Reactions  . Penicillins Swelling    FACIAL SWELLING  PATIENT HAS HAD A PCN REACTION WITH IMMEDIATE RASH, FACIAL/TONGUE/THROAT SWELLING, SOB, OR LIGHTHEADEDNESS WITH HYPOTENSION:  #  #  #  YES  #  #  #   Has patient had a PCN reaction causing severe rash involving mucus membranes or skin necrosis: No Has patient had a PCN reaction that required hospitalization: No Has patient had a PCN reaction occurring within the last 10 years: No If all of the above answers are "NO", then may proceed with Cephalosporin u  . Sulfa Antibiotics Itching and Rash   Follow-up Information    [facility] Follow up.   Contact information: [place] [address] [phone]            The results of significant diagnostics from this hospitalization (including imaging, microbiology, ancillary and laboratory) are listed below for reference.    Significant Diagnostic Studies: Dg Chest 2 View  Result Date: 10/13/2017 CLINICAL DATA:  Multiple falls over the past 2 days.  Weakness. EXAM: CHEST  2 VIEW COMPARISON:  None. FINDINGS: The heart size and mediastinal contours are within normal limits. Lung volumes are slightly low without pneumonic consolidation or CHF. No effusion or pneumothorax. Chronic left fifth and sixth rib fractures. No acute osseous abnormality of the bony thorax. IMPRESSION: No active cardiopulmonary disease. Electronically Signed   By: Ashley Royalty M.D.   On: 10/13/2017 18:51   Dg Lumbar Spine Complete  Result Date: 10/13/2017 CLINICAL DATA:  Patient with multiple falls over the  past several days due to weakness. Pain in the lower back. EXAM: LUMBAR SPINE - COMPLETE 4+ VIEW COMPARISON:  None FINDINGS: Age-indeterminate superior endplate compression of L1 with at least 25% height loss. Schmorl's nodes noted of the superior endplates of K09 and F81 best seen on the lateral projection. No pars defects. Multilevel degenerative facet arthropathy of the lumbar spine. There is dextroscoliosis of the thoracic spine. Right upper quadrant calcifications are noted either representing gallstones or renal calculi. Bowel gas pattern is unremarkable. Sacroiliac joints are maintained. IMPRESSION: 1. Age-indeterminate superior endplate compression of L1 with at least 25% height loss and resultant dextroconvex curvature of the dorsal spine at the thoracolumbar junction. No retropulsion is identified. Central superior endplate compressions of T10 and T12 are also noted on the lateral view. 2. Right upper quadrant abdominal calcifications may reflect gallstones or renal calculi. Electronically Signed   By: Ashley Royalty M.D.   On: 10/13/2017 18:48   Dg Sacrum/coccyx  Result Date: 10/13/2017 CLINICAL  DATA:  Lower back pain after fall. EXAM: SACRUM AND COCCYX - 2+ VIEW COMPARISON:  None. FINDINGS: Slightly buckled appearance of the S2 segment of the sacrum along its ventral cortex. This may represent a subtle sacral fracture. Remainder of the study is unremarkable. The patient is status post left femoral nail fixation. IMPRESSION: There is a slight buckled appearance of the ventral cortex of the S2 segment of the sacrum. This has the appearance of a subtle fracture. Electronically Signed   By: Ashley Royalty M.D.   On: 10/13/2017 18:50   Dg Elbow 2 Views Left  Result Date: 10/13/2017 CLINICAL DATA:  Recent fall. History of prior olecranon fracture with prior surgery removal of hardware. EXAM: LEFT ELBOW - 2 VIEW COMPARISON:  None. FINDINGS: No acute fracture or dislocation identified. No evidence of joint  effusion. Posttraumatic degenerative disease noted as well as proliferative disease involving the olecranon and distal humerus. No bony destruction. IMPRESSION: No acute fracture.  Posttraumatic degenerative disease of the elbow. Electronically Signed   By: Aletta Edouard M.D.   On: 10/13/2017 20:14   Ct Head Wo Contrast  Result Date: 10/13/2017 CLINICAL DATA:  Multiple recent falls EXAM: CT HEAD WITHOUT CONTRAST TECHNIQUE: Contiguous axial images were obtained from the base of the skull through the vertex without intravenous contrast. COMPARISON:  None. FINDINGS: Brain: No mass lesion, intraparenchymal hemorrhage or extra-axial collection. No evidence of acute cortical infarct. Brain parenchyma and CSF-containing spaces are normal for age. Vascular: No hyperdense vessel or unexpected calcification. Skull: Normal visualized skull base, calvarium and extracranial soft tissues. Sinuses/Orbits: No sinus fluid levels or advanced mucosal thickening. No mastoid effusion. Normal orbits. IMPRESSION: Normal head CT. Electronically Signed   By: Ulyses Jarred M.D.   On: 10/13/2017 18:46    Microbiology: No results found for this or any previous visit (from the past 240 hour(s)).   Labs: Basic Metabolic Panel: Recent Labs  Lab 10/13/17 1640 10/13/17 1659 10/14/17 0621  NA 136  --  138  K 2.8*  --  3.0*  CL 97*  --  103  CO2 28  --  28  GLUCOSE 105*  --  110*  BUN 10  --  10  CREATININE 0.66  --  0.72  CALCIUM 9.1  --  8.6*  MG  --  1.8  --    Liver Function Tests: Recent Labs  Lab 10/13/17 1744 10/14/17 0621  AST 88* 59*  ALT 27 23  ALKPHOS 121 116  BILITOT 5.3* 3.9*  PROT 7.5 6.0*  ALBUMIN 3.5 2.9*   No results for input(s): LIPASE, AMYLASE in the last 168 hours. No results for input(s): AMMONIA in the last 168 hours. CBC: Recent Labs  Lab 10/13/17 1640 10/14/17 0621  WBC 6.9 6.0  NEUTROABS 4.7  --   HGB 14.0 11.7*  HCT 43.7 38.2  MCV 96.3 97.9  PLT 50* 39*   Cardiac  Enzymes: No results for input(s): CKTOTAL, CKMB, CKMBINDEX, TROPONINI in the last 168 hours. BNP: BNP (last 3 results) No results for input(s): BNP in the last 8760 hours.  ProBNP (last 3 results) No results for input(s): PROBNP in the last 8760 hours.  CBG: No results for input(s): GLUCAP in the last 168 hours.     Signed:  Lelon Frohlich  [facility] Pager: [phone] 10/14/2017, 4:36 PM

## 2017-10-14 NOTE — Plan of Care (Signed)
Pt is progressing

## 2017-10-15 MED ORDER — OXYCODONE HCL 5 MG PO TABS: 5.0000 mg | ORAL_TABLET | Freq: Three times a day (TID) | ORAL | 0 refills | Status: DC | PRN

## 2017-10-15 NOTE — Progress Notes (Signed)
Waiting on patient's sister to show up and take the patient home.

## 2017-10-15 NOTE — Progress Notes (Signed)
Patient said she needed pain medication for home . Paged Dr. Jerilee Hoh and she gave the patient enough for 3 days, until she can see her primary.

## 2017-10-15 NOTE — Progress Notes (Signed)
Patient discharged. Sent home with paper work and script sent to [facility] for pain. Pt reluctant to go, but I explained that there was no medical reason for her to be here and that she should have been discharged yesterday when her orders were put in by the doctor.  She was sent home with all personal belongings.   Her sister is transporting her home.

## 2017-10-15 NOTE — Discharge Planning (Signed)
10/14/2017 around 1900: picked up patient, realized pt had discharge orders put in at 1600. Talked to pt and adv pt was discharge asked pt to arrange pick up with family. Pt stated she had no one to pick her up "this late". (Its 1938) She said her husband is in jail and her sister cannot come ger her. I called her sister Lattie Haw) she said no one called to let her know patient was discharged. She cannot drive at night. She will come first thing in the am. I paged MD on call. She asked me to call [facility] and see if [facility] could provide cab/bus voucher. AC said she cannot provide cab voucher to [place]. I called MD back and adv pt has no $ and no one who can pick her up. Pt also stated that her sister has her house keys therefore, has not access to her house. MD adv she will not remove discharge order. She said pt is still discharged and will need to be picked up as soon as possible. Adv pt that she is still discharged. She can stay overnight but will need to be picked up as soon as possible in the morning. Pt understood. Pt not happy she is discharged and also not happy because her phone charger broke and I did not provide her with another charger nor did I take it downstairs and wait while it charged. Explained to patient that I cannot take her phone downstairs because I have other patients to care for. Pt still not happy. Will continue to monitor patient.

## 2017-10-29 ENCOUNTER — Emergency Department (HOSPITAL_COMMUNITY): Payer: Medicare Other

## 2017-10-29 ENCOUNTER — Inpatient Hospital Stay (HOSPITAL_COMMUNITY)
Admission: EM | Admit: 2017-10-29 | Discharge: 2017-11-09 | DRG: 432 | Disposition: A | Discharge status: Home Health | Payer: Medicare Other | Attending: Internal Medicine | Admitting: Internal Medicine

## 2017-10-29 ENCOUNTER — Inpatient Hospital Stay (HOSPITAL_COMMUNITY): Payer: Medicare Other

## 2017-10-29 ENCOUNTER — Encounter (HOSPITAL_COMMUNITY): Payer: Self-pay

## 2017-10-29 ENCOUNTER — Other Ambulatory Visit: Payer: Self-pay

## 2017-10-29 DIAGNOSIS — Z880 Allergy status to penicillin: Secondary | ICD-10-CM | POA: Diagnosis not present

## 2017-10-29 DIAGNOSIS — R188 Other ascites: Secondary | ICD-10-CM

## 2017-10-29 DIAGNOSIS — E43 Unspecified severe protein-calorie malnutrition: Secondary | ICD-10-CM | POA: Diagnosis not present

## 2017-10-29 DIAGNOSIS — Y92009 Unspecified place in unspecified non-institutional (private) residence as the place of occurrence of the external cause: Secondary | ICD-10-CM | POA: Diagnosis not present

## 2017-10-29 DIAGNOSIS — D692 Other nonthrombocytopenic purpura: Secondary | ICD-10-CM | POA: Diagnosis present

## 2017-10-29 DIAGNOSIS — R296 Repeated falls: Secondary | ICD-10-CM | POA: Diagnosis present

## 2017-10-29 DIAGNOSIS — K7040 Alcoholic hepatic failure without coma: Principal | ICD-10-CM | POA: Diagnosis present

## 2017-10-29 DIAGNOSIS — K567 Ileus, unspecified: Secondary | ICD-10-CM | POA: Diagnosis not present

## 2017-10-29 DIAGNOSIS — R4182 Altered mental status, unspecified: Secondary | ICD-10-CM | POA: Diagnosis not present

## 2017-10-29 DIAGNOSIS — J189 Pneumonia, unspecified organism: Secondary | ICD-10-CM | POA: Diagnosis present

## 2017-10-29 DIAGNOSIS — W19XXXA Unspecified fall, initial encounter: Secondary | ICD-10-CM | POA: Diagnosis present

## 2017-10-29 DIAGNOSIS — K7682 Hepatic encephalopathy: Secondary | ICD-10-CM | POA: Diagnosis present

## 2017-10-29 DIAGNOSIS — E872 Acidosis: Secondary | ICD-10-CM | POA: Diagnosis not present

## 2017-10-29 DIAGNOSIS — S32049A Unspecified fracture of fourth lumbar vertebra, initial encounter for closed fracture: Secondary | ICD-10-CM | POA: Diagnosis present

## 2017-10-29 DIAGNOSIS — R161 Splenomegaly, not elsewhere classified: Secondary | ICD-10-CM | POA: Diagnosis present

## 2017-10-29 DIAGNOSIS — E860 Dehydration: Secondary | ICD-10-CM | POA: Diagnosis present

## 2017-10-29 DIAGNOSIS — R7989 Other specified abnormal findings of blood chemistry: Secondary | ICD-10-CM

## 2017-10-29 DIAGNOSIS — R52 Pain, unspecified: Secondary | ICD-10-CM

## 2017-10-29 DIAGNOSIS — R531 Weakness: Secondary | ICD-10-CM | POA: Diagnosis not present

## 2017-10-29 DIAGNOSIS — R609 Edema, unspecified: Secondary | ICD-10-CM | POA: Diagnosis present

## 2017-10-29 DIAGNOSIS — G9340 Encephalopathy, unspecified: Secondary | ICD-10-CM | POA: Diagnosis present

## 2017-10-29 DIAGNOSIS — D696 Thrombocytopenia, unspecified: Secondary | ICD-10-CM

## 2017-10-29 DIAGNOSIS — D689 Coagulation defect, unspecified: Secondary | ICD-10-CM | POA: Diagnosis present

## 2017-10-29 DIAGNOSIS — Z7401 Bed confinement status: Secondary | ICD-10-CM

## 2017-10-29 DIAGNOSIS — Z515 Encounter for palliative care: Secondary | ICD-10-CM

## 2017-10-29 DIAGNOSIS — S32009A Unspecified fracture of unspecified lumbar vertebra, initial encounter for closed fracture: Secondary | ICD-10-CM | POA: Diagnosis present

## 2017-10-29 DIAGNOSIS — S3210XA Unspecified fracture of sacrum, initial encounter for closed fracture: Secondary | ICD-10-CM | POA: Diagnosis present

## 2017-10-29 DIAGNOSIS — K7290 Hepatic failure, unspecified without coma: Secondary | ICD-10-CM

## 2017-10-29 DIAGNOSIS — J90 Pleural effusion, not elsewhere classified: Secondary | ICD-10-CM | POA: Diagnosis present

## 2017-10-29 DIAGNOSIS — F419 Anxiety disorder, unspecified: Secondary | ICD-10-CM | POA: Diagnosis present

## 2017-10-29 DIAGNOSIS — K7210 Chronic hepatic failure without coma: Secondary | ICD-10-CM | POA: Diagnosis not present

## 2017-10-29 DIAGNOSIS — K7460 Unspecified cirrhosis of liver: Secondary | ICD-10-CM | POA: Diagnosis not present

## 2017-10-29 DIAGNOSIS — R109 Unspecified abdominal pain: Secondary | ICD-10-CM

## 2017-10-29 DIAGNOSIS — K3189 Other diseases of stomach and duodenum: Secondary | ICD-10-CM | POA: Diagnosis not present

## 2017-10-29 DIAGNOSIS — S32059A Unspecified fracture of fifth lumbar vertebra, initial encounter for closed fracture: Secondary | ICD-10-CM | POA: Diagnosis present

## 2017-10-29 DIAGNOSIS — R1084 Generalized abdominal pain: Secondary | ICD-10-CM | POA: Diagnosis not present

## 2017-10-29 DIAGNOSIS — E039 Hypothyroidism, unspecified: Secondary | ICD-10-CM | POA: Diagnosis present

## 2017-10-29 DIAGNOSIS — F329 Major depressive disorder, single episode, unspecified: Secondary | ICD-10-CM | POA: Diagnosis present

## 2017-10-29 DIAGNOSIS — K5981 Ogilvie syndrome: Secondary | ICD-10-CM

## 2017-10-29 DIAGNOSIS — K7200 Acute and subacute hepatic failure without coma: Secondary | ICD-10-CM | POA: Diagnosis present

## 2017-10-29 DIAGNOSIS — Z882 Allergy status to sulfonamides status: Secondary | ICD-10-CM

## 2017-10-29 DIAGNOSIS — R0602 Shortness of breath: Secondary | ICD-10-CM

## 2017-10-29 DIAGNOSIS — R140 Abdominal distension (gaseous): Secondary | ICD-10-CM

## 2017-10-29 DIAGNOSIS — K766 Portal hypertension: Secondary | ICD-10-CM | POA: Diagnosis present

## 2017-10-29 DIAGNOSIS — K7030 Alcoholic cirrhosis of liver without ascites: Secondary | ICD-10-CM | POA: Diagnosis present

## 2017-10-29 DIAGNOSIS — K598 Other specified functional intestinal disorders: Secondary | ICD-10-CM | POA: Diagnosis not present

## 2017-10-29 DIAGNOSIS — E876 Hypokalemia: Secondary | ICD-10-CM | POA: Diagnosis present

## 2017-10-29 DIAGNOSIS — Z86010 Personal history of colonic polyps: Secondary | ICD-10-CM

## 2017-10-29 DIAGNOSIS — R32 Unspecified urinary incontinence: Secondary | ICD-10-CM | POA: Diagnosis not present

## 2017-10-29 DIAGNOSIS — Z79890 Hormone replacement therapy (postmenopausal): Secondary | ICD-10-CM

## 2017-10-29 DIAGNOSIS — E440 Moderate protein-calorie malnutrition: Secondary | ICD-10-CM | POA: Diagnosis present

## 2017-10-29 DIAGNOSIS — S32009D Unspecified fracture of unspecified lumbar vertebra, subsequent encounter for fracture with routine healing: Secondary | ICD-10-CM | POA: Diagnosis not present

## 2017-10-29 DIAGNOSIS — Z6824 Body mass index (BMI) 24.0-24.9, adult: Secondary | ICD-10-CM

## 2017-10-29 DIAGNOSIS — Z79899 Other long term (current) drug therapy: Secondary | ICD-10-CM

## 2017-10-29 LAB — I-STAT CG4 LACTIC ACID, ED: Lactic Acid, Venous: 4.41 mmol/L (ref 0.5–1.9)

## 2017-10-29 LAB — COMPREHENSIVE METABOLIC PANEL
ALT: 97 U/L — ABNORMAL HIGH (ref 14–54)
AST: 156 U/L — ABNORMAL HIGH (ref 15–41)
Albumin: 2.7 g/dL — ABNORMAL LOW (ref 3.5–5.0)
Alkaline Phosphatase: 313 U/L — ABNORMAL HIGH (ref 38–126)
Anion gap: 13 (ref 5–15)
BUN: 5 mg/dL — ABNORMAL LOW (ref 6–20)
CO2: 26 mmol/L (ref 22–32)
Calcium: 8.5 mg/dL — ABNORMAL LOW (ref 8.9–10.3)
Chloride: 100 mmol/L — ABNORMAL LOW (ref 101–111)
Creatinine, Ser: 0.79 mg/dL (ref 0.44–1.00)
GFR calc Af Amer: >60 mL/min (ref >60)
GFR calc non Af Amer: >60 mL/min (ref >60)
Glucose, Bld: 65 mg/dL (ref 65–99)
Potassium: 3.8 mmol/L (ref 3.5–5.1)
Sodium: 139 mmol/L (ref 135–145)
Total Bilirubin: 4.1 mg/dL — ABNORMAL HIGH (ref 0.3–1.2)
Total Protein: 6 g/dL — ABNORMAL LOW (ref 6.5–8.1)

## 2017-10-29 LAB — URINALYSIS, ROUTINE W REFLEX MICROSCOPIC
Glucose, UA: NEGATIVE mg/dL
Ketones, ur: NEGATIVE mg/dL
Leukocytes, UA: NEGATIVE
Nitrite: NEGATIVE
Protein, ur: 30 mg/dL — AB
Specific Gravity, Urine: 1.025 (ref 1.005–1.030)
pH: 7 (ref 5.0–8.0)

## 2017-10-29 LAB — CBC WITH DIFFERENTIAL/PLATELET
Basophils Absolute: 0.1 10*3/uL (ref 0.0–0.1)
Basophils Relative: 1 %
Eosinophils Absolute: 0.1 10*3/uL (ref 0.0–0.7)
Eosinophils Relative: 1 %
HCT: 34.3 % — ABNORMAL LOW (ref 36.0–46.0)
Hemoglobin: 10.8 g/dL — ABNORMAL LOW (ref 12.0–15.0)
Lymphocytes Relative: 11 %
Lymphs Abs: 1.3 10*3/uL (ref 0.7–4.0)
MCH: 30.1 pg (ref 26.0–34.0)
MCHC: 31.5 g/dL (ref 30.0–36.0)
MCV: 95.5 fL (ref 78.0–100.0)
Monocytes Absolute: 1.4 10*3/uL — ABNORMAL HIGH (ref 0.1–1.0)
Monocytes Relative: 12 %
Neutro Abs: 8.5 10*3/uL — ABNORMAL HIGH (ref 1.7–7.7)
Neutrophils Relative %: 75 %
Platelets: 51 10*3/uL — ABNORMAL LOW (ref 150–400)
RBC: 3.59 MIL/uL — ABNORMAL LOW (ref 3.87–5.11)
RDW: 19.5 % — ABNORMAL HIGH (ref 11.5–15.5)
WBC: 11.4 10*3/uL — ABNORMAL HIGH (ref 4.0–10.5)

## 2017-10-29 LAB — URINALYSIS, MICROSCOPIC (REFLEX)

## 2017-10-29 LAB — AMMONIA: Ammonia: 104 umol/L — ABNORMAL HIGH (ref 9–35)

## 2017-10-29 LAB — PROTIME-INR
INR: 1.39
Prothrombin Time: 17 seconds — ABNORMAL HIGH (ref 11.4–15.2)

## 2017-10-29 LAB — ETHANOL: Alcohol, Ethyl (B): <10 mg/dL (ref <10)

## 2017-10-29 LAB — MRSA PCR SCREENING: MRSA by PCR: NEGATIVE

## 2017-10-29 MED ORDER — IOPAMIDOL (ISOVUE-300) INJECTION 61%
INTRAVENOUS | Status: AC
  Filled 2017-10-29: qty 100

## 2017-10-29 MED ORDER — LEVOTHYROXINE SODIUM 50 MCG PO TABS
50.0000 ug | ORAL_TABLET | Freq: Every day | ORAL | Status: DC
  Administered 2017-10-30 – 2017-11-09 (×10): 50 ug via ORAL
  Filled 2017-10-29 (×11): qty 1

## 2017-10-29 MED ORDER — SERTRALINE HCL 100 MG PO TABS
100.0000 mg | ORAL_TABLET | Freq: Every day | ORAL | Status: DC
  Administered 2017-10-29 – 2017-11-08 (×11): 100 mg via ORAL
  Filled 2017-10-29 (×11): qty 1

## 2017-10-29 MED ORDER — VANCOMYCIN HCL IN DEXTROSE 1-5 GM/200ML-% IV SOLN
1000.0000 mg | Freq: Once | INTRAVENOUS | Status: AC
  Administered 2017-10-29: 1000 mg via INTRAVENOUS
  Filled 2017-10-29: qty 200

## 2017-10-29 MED ORDER — DEXTROSE 5 % IV SOLN
2.0000 g | Freq: Once | INTRAVENOUS | Status: AC
  Administered 2017-10-29: 2 g via INTRAVENOUS
  Filled 2017-10-29: qty 2

## 2017-10-29 MED ORDER — MAGNESIUM OXIDE 400 (241.3 MG) MG PO TABS
200.0000 mg | ORAL_TABLET | Freq: Every day | ORAL | Status: DC
  Administered 2017-10-30 – 2017-11-09 (×10): 200 mg via ORAL
  Filled 2017-10-29 (×12): qty 1

## 2017-10-29 MED ORDER — KETOROLAC TROMETHAMINE 15 MG/ML IJ SOLN
15.0000 mg | Freq: Once | INTRAMUSCULAR | Status: AC
  Administered 2017-10-29: 15 mg via INTRAVENOUS
  Filled 2017-10-29: qty 1

## 2017-10-29 MED ORDER — ONDANSETRON HCL 4 MG/2ML IJ SOLN: 4.0000 mg | Freq: Four times a day (QID) | INTRAMUSCULAR | Status: DC | PRN

## 2017-10-29 MED ORDER — VANCOMYCIN HCL IN DEXTROSE 750-5 MG/150ML-% IV SOLN
750.0000 mg | Freq: Two times a day (BID) | INTRAVENOUS | Status: DC
  Administered 2017-10-30 – 2017-11-02 (×7): 750 mg via INTRAVENOUS
  Filled 2017-10-29 (×9): qty 150

## 2017-10-29 MED ORDER — SODIUM CHLORIDE 0.9 % IV BOLUS (SEPSIS)
500.0000 mL | Freq: Once | INTRAVENOUS | Status: AC
  Administered 2017-10-29: 500 mL via INTRAVENOUS

## 2017-10-29 MED ORDER — LACTULOSE 10 GM/15ML PO SOLN
30.0000 g | Freq: Three times a day (TID) | ORAL | Status: DC
  Administered 2017-10-29 – 2017-10-30 (×2): 30 g via ORAL
  Filled 2017-10-29 (×4): qty 45

## 2017-10-29 MED ORDER — OXYCODONE HCL 5 MG PO TABS
5.0000 mg | ORAL_TABLET | ORAL | Status: DC | PRN
  Administered 2017-10-29 – 2017-10-30 (×4): 5 mg via ORAL
  Filled 2017-10-29 (×4): qty 1

## 2017-10-29 MED ORDER — LEVOFLOXACIN IN D5W 750 MG/150ML IV SOLN
750.0000 mg | Freq: Once | INTRAVENOUS | Status: AC
  Administered 2017-10-29: 750 mg via INTRAVENOUS
  Filled 2017-10-29: qty 150

## 2017-10-29 MED ORDER — FERROUS SULFATE 325 (65 FE) MG PO TABS
325.0000 mg | ORAL_TABLET | Freq: Three times a day (TID) | ORAL | Status: DC
  Administered 2017-10-29 (×2): 325 mg via ORAL
  Filled 2017-10-29 (×2): qty 1

## 2017-10-29 MED ORDER — OXYCODONE HCL 5 MG PO TABS
5.0000 mg | ORAL_TABLET | Freq: Once | ORAL | Status: AC
  Administered 2017-10-29: 5 mg via ORAL
  Filled 2017-10-29: qty 1

## 2017-10-29 MED ORDER — LACTULOSE 10 GM/15ML PO SOLN: 20.0000 g | Freq: Three times a day (TID) | ORAL | Status: DC

## 2017-10-29 MED ORDER — LIDOCAINE 2% (20 MG/ML) 5 ML SYRINGE
INTRAMUSCULAR | Status: AC
  Filled 2017-10-29: qty 5

## 2017-10-29 MED ORDER — HYOSCYAMINE SULFATE ER 0.375 MG PO TB12
0.3750 mg | ORAL_TABLET | Freq: Two times a day (BID) | ORAL | Status: DC
  Administered 2017-10-29: 0.375 mg via ORAL
  Filled 2017-10-29: qty 1

## 2017-10-29 MED ORDER — IOPAMIDOL (ISOVUE-300) INJECTION 61%
INTRAVENOUS | Status: AC
  Administered 2017-10-29: 100 mL
  Filled 2017-10-29: qty 100

## 2017-10-29 MED ORDER — DEXTROSE 5 % IV SOLN
2.0000 g | Freq: Three times a day (TID) | INTRAVENOUS | Status: DC
  Administered 2017-10-29 – 2017-11-02 (×11): 2 g via INTRAVENOUS
  Filled 2017-10-29 (×12): qty 2

## 2017-10-29 MED ORDER — SODIUM CHLORIDE 0.9 % IV SOLN
INTRAVENOUS | Status: DC
  Administered 2017-10-29 – 2017-10-30 (×2): via INTRAVENOUS

## 2017-10-29 MED ORDER — LEVOFLOXACIN IN D5W 750 MG/150ML IV SOLN
750.0000 mg | INTRAVENOUS | Status: AC
  Administered 2017-10-30 – 2017-11-04 (×6): 750 mg via INTRAVENOUS
  Filled 2017-10-29 (×6): qty 150

## 2017-10-29 MED ORDER — IBUPROFEN 400 MG PO TABS
600.0000 mg | ORAL_TABLET | Freq: Four times a day (QID) | ORAL | Status: DC | PRN
  Administered 2017-10-30: 600 mg via ORAL
  Filled 2017-10-29 (×2): qty 1

## 2017-10-29 MED ORDER — RIFAXIMIN 200 MG PO TABS
200.0000 mg | ORAL_TABLET | Freq: Three times a day (TID) | ORAL | Status: DC
  Administered 2017-10-29 – 2017-11-09 (×31): 200 mg via ORAL
  Filled 2017-10-29 (×36): qty 1

## 2017-10-29 MED ORDER — ZOLPIDEM TARTRATE 5 MG PO TABS: 5.0000 mg | ORAL_TABLET | Freq: Every evening | ORAL | Status: DC | PRN

## 2017-10-29 MED ORDER — TRAZODONE HCL 50 MG PO TABS
50.0000 mg | ORAL_TABLET | Freq: Every day | ORAL | Status: DC
  Administered 2017-10-29 – 2017-11-08 (×11): 50 mg via ORAL
  Filled 2017-10-29 (×11): qty 1

## 2017-10-29 MED ORDER — GUAIFENESIN-DM 100-10 MG/5ML PO SYRP
5.0000 mL | ORAL_SOLUTION | ORAL | Status: DC | PRN
  Administered 2017-10-29: 5 mL via ORAL
  Filled 2017-10-29: qty 5

## 2017-10-29 NOTE — Progress Notes (Signed)
Pharmacy Antibiotic Note  Megan Roberson is a 61 y.o. female admitted on 10/29/2017 with sepsis.  Pharmacy has been consulted for Aztreonam, levaquin and vancomycin dosing. Pt is here with worsening confusion and weakness. WBC 11.4. LA 4.4. SCr 0.79. Afebrile. nCrCl ~ 65 mL/min. Patient has one-time orders for vancomycin, aztreonam and levaquin.   Plan: Aztreonam 1 gm IV Q 8 hours  Vancomycin 750 mg IV Q 12 hours Levaquin 750 mg IV Q 24 hours  Height: 5' 7"  (170.2 cm) Weight: 145 lb (65.8 kg) IBW/kg (Calculated) : 61.6  Temp (24hrs), Avg:98.9 F (37.2 C), Min:98.7 F (37.1 C), Max:99.1 F (37.3 C)  Recent Labs  Lab 10/29/17 1215 10/29/17 1230  WBC 11.4*  --   CREATININE 0.79  --   LATICACIDVEN  --  4.41*    Estimated Creatinine Clearance: 73.6 mL/min (by C-G formula based on SCr of 0.79 mg/dL).    Allergies  Allergen Reactions  . Penicillins Swelling    FACIAL SWELLING  PATIENT HAS HAD A PCN REACTION WITH IMMEDIATE RASH, FACIAL/TONGUE/THROAT SWELLING, SOB, OR LIGHTHEADEDNESS WITH HYPOTENSION:  #  #  #  YES  #  #  #   Has patient had a PCN reaction causing severe rash involving mucus membranes or skin necrosis: No Has patient had a PCN reaction that required hospitalization: No Has patient had a PCN reaction occurring within the last 10 years: No If all of the above answers are "NO", then may proceed with Cephalosporin u  . Sulfa Antibiotics Itching and Rash    Antimicrobials this admission: Vanc 12/28 >>  Aztreonam 12/28 >>  Levaquin 12/28 >>   Dose adjustments this admission: None   Microbiology results: 12/28 BCx:    Thank you for allowing pharmacy to be a part of this patient's care.  Albertina Parr, PharmD., BCPS Clinical Pharmacist Pager [phone]

## 2017-10-29 NOTE — Plan of Care (Signed)
Pt has complained of 10/10 generalized pain. RN received orders for pain medications with little relief. Will continue to monitor.

## 2017-10-29 NOTE — ED Triage Notes (Signed)
Pts husband bring pt to ED for confusion weakness and muscle cramping. Pt is alert and oriented to person and time but confused on place. Pt is bruised all over and has swelling in abd. Pt has history of liver failure.

## 2017-10-29 NOTE — Progress Notes (Signed)
I personally reviewed the CTAP images, then called and reviewed over the phone with Dr. Radene Knee (on call this evening for [facility]).  The ascending colon wall thickening appears definite, but is of unknown cause.  The redundant transverse and descending colon are distended to about 7cm,then decompresses in the sigmoid without focal obstructing lesion or radiographic evidence of ischemia.  There is no volvulus or torsion of the mesentery.  The diffusely edematous mesentery is most likely from the portal hypertension. Mesenteric vessels are patent without aortic atherosclerosis. There are acute to subacute spinal fractures, most likely from recent falls and which explain her severe back pain.  I suspect this represents a partial colonic pseudo-obstruction from recent fractures and immobility, exacerbated by opioid analgesic.  There is no evidence of ischemic bowel, mechanical obstruction or perforation. No colonoscopy at this point with colon diameter 7cm and very redundant colon  - scope with insufflation might make her worse.  Will need to avoid opiates and ambulate if possible to improve bowel motility. Monitor closely, check CMP and lactate in AM, KUB in AM.

## 2017-10-29 NOTE — Progress Notes (Signed)
This is an addendum of H&P dictated by Advanced Practitioner Caryl Ada.  Megan Roberson is a 61 y.o. female with medical history significant of cirrhosis of the Liver, Pancytopenia, Anxiety and neuropathy. Pt brought in today by her husband who reports pt has increased confusion and frequent falls.    Patient seen and examined with her husband at bedside. Reports last paracentesis was 1 week ago 5 L fluid removed. Intermittent confusion reported by her husband. She states she does not take lactulose daily. She takes it every other day or every other 2 days due to her ambulatory dysfunction and difficulty getting around. Planned paracentesis unable to be competed today 10/29/17 due to not enough fluid found to be removed. CT abd pelvis ordered to evaluate further.  Agree with assessment and plan done by APP.

## 2017-10-29 NOTE — Progress Notes (Signed)
Patient brought over to IR for paracentesis.  Patient does not have a fluid wave.  On [place] the patient does not have any fluid in her abdomen in order to be able to perform a paracentesis.  CT scan may be helpful to determine what is causing her abdominal distention.  We will defer that decision to her primary team.  Henreitta Cea 4:04 PM 10/29/2017

## 2017-10-29 NOTE — ED Provider Notes (Signed)
[facility] Provider Note   CSN: [id number] Arrival date & time: 10/29/17  1054     History   Chief Complaint Chief Complaint  Patient presents with  . Altered Mental Status    HPI Megan Roberson is a 61 y.o. female.  HPI  5 caveat patient is a poor historian likely due to her failure This is a 61 year old female with most of the history obtained from her husband who presents today with worsening confusion, weakness, muscle cramping.  She has a history of cirrhosis.  She has a distended abdomen and diffuse contusions.  Husband reports that she has fallen multiple times over the past 2 weeks and is now unable to stand for the past several days.  He reports decreased p.o. intake as well as increased abdominal swelling.  He reports that he was out of town from 2 weeks ago until 5 days ago.  He states that she started falling during the time that he was gone.  Past Medical History:  Diagnosis Date  . Anxiety   . Cirrhosis of liver ([place])   . Depression   . Heart murmur    "not concerned about "   . History of abdominal paracentesis   . History of blood transfusion   . Hypothyroidism   . Liver disease 2008  . Neuropathy   . Pancytopenia [facility])     Patient Active Problem List   Diagnosis Date Noted  . Cirrhosis of liver ([place]) 04/21/2017  . Anemia 04/21/2017  . History of open reduction and internal fixation (ORIF) procedure 04/21/2017  . Visit for suture removal 04/21/2017  . Left Olecranon fracture 04/21/2017  . Hypokalemia 04/21/2017    Past Surgical History:  Procedure Laterality Date  . COLONOSCOPY WITH ESOPHAGOGASTRODUODENOSCOPY (EGD) AND ESOPHAGEAL DILATION (ED)    . ESOPHAGOGASTRODUODENOSCOPY W/ BANDING     Varices  . FRACTURE SURGERY Left    x 2  . HARDWARE REMOVAL Left 09/17/2017   Procedure: HARDWARE REMOVAL LEFT OLECRANON;  Surgeon: Altamese [place], MD;  Location: [facility];  Service: Orthopedics;  Laterality: Left;  . HIP  SURGERY Left    from fracture- rod in palce  . KNEE SURGERY Bilateral    open incision for ligaments  . ORIF ELBOW FRACTURE Left 04/30/2017   Procedure: REVISION OPEN REDUCTION INTERNAL FIXATION (ORIF) ELBOW/OLECRANON FRACTURE;  Surgeon: Altamese Moran, MD;  Location: [facility];  Service: Orthopedics;  Laterality: Left;    OB History    No data available       Home Medications    Prior to Admission medications   Medication Sig Start Date End Date Taking? Authorizing Provider  furosemide (LASIX) 40 MG tablet Take 40 mg by mouth daily. 10/07/17   [provider]  gabapentin (NEURONTIN) 600 MG tablet Take 600 mg by mouth 3 (three) times daily.     [provider]  lactulose (CHRONULAC) 10 GM/15ML solution Take 20 g by mouth 3 (three) times daily. 10/06/17   [provider]  levothyroxine (SYNTHROID, LEVOTHROID) 50 MCG tablet Take 50 mcg daily before breakfast by mouth.     [provider]  ondansetron (ZOFRAN ODT) 4 MG disintegrating tablet Take 1 tablet (4 mg total) every 8 (eight) hours as needed by mouth for nausea or vomiting. 09/17/17   Ainsley Spinner, PA-C  oxyCODONE (ROXICODONE) 5 MG immediate release tablet Take 1-2 tablets (5-10 mg total) by mouth every 8 (eight) hours as needed for severe pain. 10/15/17   Jerilee Hoh  Everardo Beals, MD  potassium chloride (K-DUR,KLOR-CON) 10 MEQ tablet Take 10 mEq 2 (two) times daily by mouth.    [provider]  rifaximin (XIFAXAN) 200 MG tablet Take 200 mg by mouth 3 (three) times daily.     [provider]  sertraline (ZOLOFT) 100 MG tablet Take 100 mg at bedtime by mouth.    [provider]  spironolactone (ALDACTONE) 50 MG tablet Take 100 mg by mouth daily. 10/06/17   [provider]  traZODone (DESYREL) 50 MG tablet Take 50 mg at bedtime by mouth.    [provider]    Family History Family History  Adopted: Yes    Social History Social History   Tobacco Use  .  Smoking status: Never Smoker  . Smokeless tobacco: Never Used  Substance Use Topics  . Alcohol use: No  . Drug use: No     Allergies   Penicillins and Sulfa antibiotics   Review of Systems Review of Systems  Unable to perform ROS: Mental status change     Physical Exam Updated Vital Signs BP (!) 135/105 (BP Location: Right Arm)   Pulse 92   Temp 98.7 F (37.1 C) (Rectal)   Resp 20   Ht 1.702 m (5' 7" )   Wt 65.8 kg (145 lb)   SpO2 98%   BMI 22.71 kg/m   Physical Exam  Constitutional: She is oriented to person, place, and time.  Chronically ill poorly nourished appearing female  HENT:  Right Ear: External ear normal.  Left Ear: External ear normal.  Multiple contusions face and neck Mm dry  Eyes: Pupils are equal, round, and reactive to light.  Neck: Normal range of motion.  Cardiovascular: Normal rate, regular rhythm and normal heart sounds.  Pulmonary/Chest:  Increased respiratory rate Lungs cta  Abdominal: She exhibits distension. She exhibits no mass. There is tenderness.  Diffusely distended abdomen with diffuse ttp  Musculoskeletal:  Diffuse muscle wasting Multiple contusions on all 4 extremities, large area of bruising left hip Full arom  no definite point ttp  Neurological: She is alert and oriented to person, place, and time.  Skin: Skin is warm and dry. Capillary refill takes less than 2 seconds.  Nursing note and vitals reviewed.    ED Treatments / Results  Labs (all labs ordered are listed, but only abnormal results are displayed) Labs Reviewed  CULTURE, BLOOD (ROUTINE X 2)  CULTURE, BLOOD (ROUTINE X 2)  CBC WITH DIFFERENTIAL/PLATELET  PROTIME-INR  COMPREHENSIVE METABOLIC PANEL  ETHANOL  AMMONIA  I-STAT CG4 LACTIC ACID, ED    EKG  EKG Interpretation None       Radiology Ct Head Wo Contrast  Result Date: 10/29/2017 CLINICAL DATA:  Confusion and weakness.  Unwitnessed fall EXAM: CT HEAD WITHOUT CONTRAST CT CERVICAL SPINE  WITHOUT CONTRAST TECHNIQUE: Multidetector CT imaging of the head and cervical spine was performed following the standard protocol without intravenous contrast. Multiplanar CT image reconstructions of the cervical spine were also generated. COMPARISON:  Head CT October 13, 2017 FINDINGS: CT HEAD FINDINGS Brain: There is mild diffuse atrophy. There is no intracranial mass, hemorrhage, extra-axial fluid collection, or midline shift. There is slight small vessel disease in the centra semiovale bilaterally. Elsewhere gray-white compartments are normal. No evident acute infarct. Vascular: No hyperdense vessel. There is minimal calcification in each carotid siphon. Skull: The bony calvarium appears intact. Sinuses/Orbits: Visualized paranasal sinuses are clear. Orbits appear symmetric bilaterally. Other: Mastoid air cells clear. CT CERVICAL SPINE FINDINGS  Alignment: There is 2 mm of anterolisthesis of C4 on C5. There is 1 mm of retrolisthesis of C5 on C6. No other spondylolisthesis evident. Skull base and vertebrae: Skull base and craniocervical junction regions appear normal. No evident fracture. There are no blastic or lytic bone lesions. Soft tissues and spinal canal: Prevertebral soft tissues appear within normal limits. Predental space region is normal. No paraspinous lesion. No cord or canal hematoma evident. Disc levels: There is severe disc space narrowing at C4-5, C5-6, and C6-7. There are anterior osteophytes at C4, C5, and C6. There is multilevel facet osteoarthritic change. There is exit foraminal narrowing due to bony hypertrophy at C5-6 and C6-7 on the left with impression on the exiting nerve roots at these levels. There is no frank disc extrusion or stenosis. Upper chest: There is asymmetric pleural thickening in the right apex with question of pleural effusion in this area. Lung apices otherwise are clear. Other: None IMPRESSION: CT head: Mild atrophy with slight periventricular small vessel disease. No  mass or hemorrhage. No extra-axial fluid or acute infarct. Mild vascular calcification noted. CT cervical spine: No fracture. Areas of mild spondylolisthesis at C4-5 and C5-6 are felt to be due to underlying spondylosis. There is multilevel osteoarthritic change. Exit foraminal narrowing due to bony hypertrophy is noted on the left at C5-6 and C6-7. No disc extrusion evident. Asymmetric apical pleural thickening on the right with questionable layering pleural effusion on the right. Electronically Signed   By: Lowella Grip III M.D.   On: 10/29/2017 14:39   Ct Cervical Spine Wo Contrast  Result Date: 10/29/2017 CLINICAL DATA:  Confusion and weakness.  Unwitnessed fall EXAM: CT HEAD WITHOUT CONTRAST CT CERVICAL SPINE WITHOUT CONTRAST TECHNIQUE: Multidetector CT imaging of the head and cervical spine was performed following the standard protocol without intravenous contrast. Multiplanar CT image reconstructions of the cervical spine were also generated. COMPARISON:  Head CT October 13, 2017 FINDINGS: CT HEAD FINDINGS Brain: There is mild diffuse atrophy. There is no intracranial mass, hemorrhage, extra-axial fluid collection, or midline shift. There is slight small vessel disease in the centra semiovale bilaterally. Elsewhere gray-white compartments are normal. No evident acute infarct. Vascular: No hyperdense vessel. There is minimal calcification in each carotid siphon. Skull: The bony calvarium appears intact. Sinuses/Orbits: Visualized paranasal sinuses are clear. Orbits appear symmetric bilaterally. Other: Mastoid air cells clear. CT CERVICAL SPINE FINDINGS Alignment: There is 2 mm of anterolisthesis of C4 on C5. There is 1 mm of retrolisthesis of C5 on C6. No other spondylolisthesis evident. Skull base and vertebrae: Skull base and craniocervical junction regions appear normal. No evident fracture. There are no blastic or lytic bone lesions. Soft tissues and spinal canal: Prevertebral soft tissues  appear within normal limits. Predental space region is normal. No paraspinous lesion. No cord or canal hematoma evident. Disc levels: There is severe disc space narrowing at C4-5, C5-6, and C6-7. There are anterior osteophytes at C4, C5, and C6. There is multilevel facet osteoarthritic change. There is exit foraminal narrowing due to bony hypertrophy at C5-6 and C6-7 on the left with impression on the exiting nerve roots at these levels. There is no frank disc extrusion or stenosis. Upper chest: There is asymmetric pleural thickening in the right apex with question of pleural effusion in this area. Lung apices otherwise are clear. Other: None IMPRESSION: CT head: Mild atrophy with slight periventricular small vessel disease. No mass or hemorrhage. No extra-axial fluid or acute infarct. Mild vascular calcification noted. CT cervical  spine: No fracture. Areas of mild spondylolisthesis at C4-5 and C5-6 are felt to be due to underlying spondylosis. There is multilevel osteoarthritic change. Exit foraminal narrowing due to bony hypertrophy is noted on the left at C5-6 and C6-7. No disc extrusion evident. Asymmetric apical pleural thickening on the right with questionable layering pleural effusion on the right. Electronically Signed   By: Lowella Grip III M.D.   On: 10/29/2017 14:39   Dg Chest Port 1 View  Result Date: 10/29/2017 CLINICAL DATA:  Shortness of Breath and bruising EXAM: PORTABLE CHEST 1 VIEW COMPARISON:  October 13, 2017 FINDINGS: There is focal opacity in the right mid lung region. Lungs elsewhere clear. No pneumothorax. Heart size and pulmonary vascularity are normal. No adenopathy. No fracture evident. IMPRESSION: Opacity right mid lung region. Question pneumonia versus potential contusion given the clinical history. Lungs elsewhere clear. Stable cardiac silhouette. No evident pneumothorax. Electronically Signed   By: Lowella Grip III M.D.   On: 10/29/2017 12:46     Procedures Procedures (including critical care time)  Medications Ordered in ED Medications  sodium chloride 0.9 % bolus 500 mL (not administered)     Initial Impression / Assessment and Plan / ED Course  I have reviewed the triage vital signs and the nursing notes.  Pertinent labs & imaging results that were available during my care of the patient were reviewed by me and considered in my medical decision making (see chart for details).    Patient with lactic acid >4 and potential infection with most likely source from peritonitis.  IV fluids with initial ns at one liter.    1-liver failure 2- elevated ammonia 3- multiple recent falls with contusions- ct head pending 4- altered mental status - likely secondary to #2 5- elevated lactic acid- may be secondary to fall but consider infection with most likely source abdomen.  Requesting US guided paracentesis as patient with thrombocytopemia, inr 1.39  IV antibiotics given, fluids being given judiciously as bp and hr normal with distended abdomen  CRITICAL CARE Performed by: Pattricia Boss Total critical care time: 45 minutes Critical care time was exclusive of separately billable procedures and treating other patients. Critical care was necessary to treat or prevent imminent or life-threatening deterioration. Critical care was time spent personally by me on the following activities: development of treatment plan with patient and/or surrogate as well as nursing, discussions with consultants, evaluation of patient's response to treatment, examination of patient, obtaining history from patient or surrogate, ordering and performing treatments and interventions, ordering and review of laboratory studies, ordering and review of radiographic studies, pulse oximetry and re-evaluation of patient's condition.  Discussed with Alyse Low, PA-C on call for hospitalist and she will see for admission.   Final Clinical Impressions(s) / ED Diagnoses    Final diagnoses:  Abdominal pain  Altered mental status, unspecified altered mental status type  Increased ammonia level  Liver failure without hepatic coma, unspecified chronicity (HCC)  Elevated lactic acid level  Thrombocytopenia [facility])    ED Discharge Orders    None       Pattricia Boss, MD 10/29/17 1453

## 2017-10-29 NOTE — H&P (Signed)
History and Physical    Megan Roberson XAJ:[id number] DOB: September 30, 1956 DOA: 10/29/2017  PCP: Patient, No Pcp Per Patient coming from: Home  Chief Complaint:   HPI: Megan Roberson is a 61 y.o. female with medical history significant of cirrhosis of the Liver, Pancytopenia, Anxiety and neuropathy. Pt brought in today by her husband who reports pt has increased confusion and frequent falls.  Husband was recently away.  He reports pt has had multiple falls while he has been gone.  He reports pt is very unsteady.  Pt has had increasing confusion.  He also reports she has had increased abdominal swelling.  He reports pt has bruises all over   Pt reports she used to drink a lot.  She reports she stopped drinking 6 years ago.   Pt reports she was on liver transplant list in [place] until she moved here.      ED Course:  Pt seen in the ED and had labs.  Pt has an ammonia level of 104, lactic acid over 4.  INR is 1.30.  Pt was noted to have hit her head recently when she fell. Ct in ED showed no acute abnormality. Ultrasound guided paracentesis has been ordered.  Review of Systems:  Review of Systems  Constitutional: Positive for fever.  HENT: Negative.   Eyes: Negative for blurred vision.  Respiratory: Negative for cough.   Cardiovascular: Negative for chest pain.  Gastrointestinal: Positive for abdominal pain.  Genitourinary: Negative for dysuria.  Musculoskeletal: Positive for falls and myalgias.  Skin: Negative for rash.  Neurological: Positive for weakness. Negative for dizziness and headaches.  Endo/Heme/Allergies: Bruises/bleeds easily.  Psychiatric/Behavioral: Negative for substance abuse.   Ambulatory Status: limited with a walker, uses wheelchair  Past Medical History:  Diagnosis Date  . Anxiety   . Cirrhosis of liver ([place])   . Depression   . Heart murmur    "not concerned about "   . History of abdominal paracentesis   . History of blood transfusion   . Hypothyroidism   .  Liver disease 2008  . Neuropathy   . Pancytopenia [facility])     Past Surgical History:  Procedure Laterality Date  . COLONOSCOPY WITH ESOPHAGOGASTRODUODENOSCOPY (EGD) AND ESOPHAGEAL DILATION (ED)    . ESOPHAGOGASTRODUODENOSCOPY W/ BANDING     Varices  . FRACTURE SURGERY Left    x 2  . HARDWARE REMOVAL Left 09/17/2017   Procedure: HARDWARE REMOVAL LEFT OLECRANON;  Surgeon: Altamese , MD;  Location: [place];  Service: Orthopedics;  Laterality: Left;  . HIP SURGERY Left    from fracture- rod in palce  . KNEE SURGERY Bilateral    open incision for ligaments  . ORIF ELBOW FRACTURE Left 04/30/2017   Procedure: REVISION OPEN REDUCTION INTERNAL FIXATION (ORIF) ELBOW/OLECRANON FRACTURE;  Surgeon: Altamese , MD;  Location: [place];  Service: Orthopedics;  Laterality: Left;    Social History   Socioeconomic History  . Marital status: Married    Spouse name: Not on file  . Number of children: Not on file  . Years of education: Not on file  . Highest education level: Not on file  Social Needs  . Financial resource strain: Not on file  . Food insecurity - worry: Not on file  . Food insecurity - inability: Not on file  . Transportation needs - medical: Not on file  . Transportation needs - non-medical: Not on file  Occupational History  . Not on file  Tobacco Use  . Smoking status: Never Smoker  .  Smokeless tobacco: Never Used  Substance and Sexual Activity  . Alcohol use: No  . Drug use: No  . Sexual activity: Not Currently    Birth control/protection: Post-menopausal  Other Topics Concern  . Not on file  Social History Narrative  . Not on file    Allergies  Allergen Reactions  . Penicillins Swelling    FACIAL SWELLING  PATIENT HAS HAD A PCN REACTION WITH IMMEDIATE RASH, FACIAL/TONGUE/THROAT SWELLING, SOB, OR LIGHTHEADEDNESS WITH HYPOTENSION:  #  #  #  YES  #  #  #   Has patient had a PCN reaction causing severe rash involving mucus membranes or skin necrosis: No Has  patient had a PCN reaction that required hospitalization: No Has patient had a PCN reaction occurring within the last 10 years: No If all of the above answers are "NO", then may proceed with Cephalosporin u  . Sulfa Antibiotics Itching and Rash    Family History  Adopted: Yes    Prior to Admission medications   Medication Sig Start Date End Date Taking? Authorizing Provider  furosemide (LASIX) 40 MG tablet Take 40 mg by mouth daily. 10/07/17   [provider]  gabapentin (NEURONTIN) 600 MG tablet Take 600 mg by mouth 3 (three) times daily.     [provider]  lactulose (CHRONULAC) 10 GM/15ML solution Take 20 g by mouth 3 (three) times daily. 10/06/17   [provider]  levothyroxine (SYNTHROID, LEVOTHROID) 50 MCG tablet Take 50 mcg daily before breakfast by mouth.     [provider]  ondansetron (ZOFRAN ODT) 4 MG disintegrating tablet Take 1 tablet (4 mg total) every 8 (eight) hours as needed by mouth for nausea or vomiting. 09/17/17   Ainsley Spinner, PA-C  oxyCODONE (ROXICODONE) 5 MG immediate release tablet Take 1-2 tablets (5-10 mg total) by mouth every 8 (eight) hours as needed for severe pain. 10/15/17   Isaac Bliss, Rayford Halsted, MD  potassium chloride (K-DUR,KLOR-CON) 10 MEQ tablet Take 10 mEq 2 (two) times daily by mouth.    [provider]  rifaximin (XIFAXAN) 200 MG tablet Take 200 mg by mouth 3 (three) times daily.     [provider]  sertraline (ZOLOFT) 100 MG tablet Take 100 mg at bedtime by mouth.    [provider]  spironolactone (ALDACTONE) 50 MG tablet Take 100 mg by mouth daily. 10/06/17   [provider]  traZODone (DESYREL) 50 MG tablet Take 50 mg at bedtime by mouth.    [provider]    Physical Exam: Vitals:   10/29/17 1300 10/29/17 1315 10/29/17 1430 10/29/17 1445  BP: 126/81 (!) 139/91 (!) 152/83 (!) 141/97  Pulse: 94 92 93 94  Resp: (!) 26  17 14   Temp:      TempSrc:      SpO2:  98% 97% 96% 95%  Weight:      Height:         General:  Sick appearing Eyes: icteric,  PERRL, EOMI, normal lids, iris ENT:  grossly normal hearing, lips & tongue, mmm Neck: large bruise left kneck no LAD, masses or thyromegaly Cardiovascular: RRR, no m/r/g. No LE edema.  Respiratory: CTA bilaterally, no w/r/r. Normal respiratory effort. Abdomen:  distended Skin: jaundice no rash or induration seen on limited exam Musculoskeletal:  grossly normal tone BUE/BLE, good ROM, no bony abnormality Psychiatric:  grossly normal mood and affect,  Neurologic: confused  CN 2-12 grossly intact, moves all extremities in coordinated fashion, sensation intact  Labs on Admission: I have personally reviewed following labs and imaging studies  CBC: Recent Labs  Lab 10/29/17 1215  WBC 11.4*  NEUTROABS 8.5*  HGB 10.8*  HCT 34.3*  MCV 95.5  PLT 51*   Basic Metabolic Panel: Recent Labs  Lab 10/29/17 1215  NA 139  K 3.8  CL 100*  CO2 26  GLUCOSE 65  BUN 5*  CREATININE 0.79  CALCIUM 8.5*   GFR: Estimated Creatinine Clearance: 73.6 mL/min (by C-G formula based on SCr of 0.79 mg/dL). Liver Function Tests: Recent Labs  Lab 10/29/17 1215  AST 156*  ALT 97*  ALKPHOS 313*  BILITOT 4.1*  PROT 6.0*  ALBUMIN 2.7*   No results for input(s): LIPASE, AMYLASE in the last 168 hours. Recent Labs  Lab 10/29/17 1244  AMMONIA 104*   Coagulation Profile: Recent Labs  Lab 10/29/17 1215  INR 1.39   Cardiac Enzymes: No results for input(s): CKTOTAL, CKMB, CKMBINDEX, TROPONINI in the last 168 hours. BNP (last 3 results) No results for input(s): PROBNP in the last 8760 hours. HbA1C: No results for input(s): HGBA1C in the last 72 hours. CBG: No results for input(s): GLUCAP in the last 168 hours. Lipid Profile: No results for input(s): CHOL, HDL, LDLCALC, TRIG, CHOLHDL, LDLDIRECT in the last 72 hours. Thyroid Function Tests: No results for input(s): TSH, T4TOTAL, FREET4, T3FREE, THYROIDAB  in the last 72 hours. Anemia Panel: No results for input(s): VITAMINB12, FOLATE, FERRITIN, TIBC, IRON, RETICCTPCT in the last 72 hours. Urine analysis:    Component Value Date/Time   COLORURINE AMBER (A) 10/29/2017 1317   APPEARANCEUR CLOUDY (A) 10/29/2017 1317   LABSPEC 1.025 10/29/2017 1317   PHURINE 7.0 10/29/2017 1317   GLUCOSEU NEGATIVE 10/29/2017 1317   HGBUR LARGE (A) 10/29/2017 1317   BILIRUBINUR SMALL (A) 10/29/2017 1317   KETONESUR NEGATIVE 10/29/2017 1317   PROTEINUR 30 (A) 10/29/2017 1317   NITRITE NEGATIVE 10/29/2017 1317   LEUKOCYTESUR NEGATIVE 10/29/2017 1317    Creatinine Clearance: Estimated Creatinine Clearance: 73.6 mL/min (by C-G formula based on SCr of 0.79 mg/dL).  Sepsis Labs: @LABRCNTIP (procalcitonin:4,lacticidven:4) )No results found for this or any previous visit (from the past 240 hour(s)).   Radiological Exams on Admission: Ct Head Wo Contrast  Result Date: 10/29/2017 CLINICAL DATA:  Confusion and weakness.  Unwitnessed fall EXAM: CT HEAD WITHOUT CONTRAST CT CERVICAL SPINE WITHOUT CONTRAST TECHNIQUE: Multidetector CT imaging of the head and cervical spine was performed following the standard protocol without intravenous contrast. Multiplanar CT image reconstructions of the cervical spine were also generated. COMPARISON:  Head CT October 13, 2017 FINDINGS: CT HEAD FINDINGS Brain: There is mild diffuse atrophy. There is no intracranial mass, hemorrhage, extra-axial fluid collection, or midline shift. There is slight small vessel disease in the centra semiovale bilaterally. Elsewhere gray-white compartments are normal. No evident acute infarct. Vascular: No hyperdense vessel. There is minimal calcification in each carotid siphon. Skull: The bony calvarium appears intact. Sinuses/Orbits: Visualized paranasal sinuses are clear. Orbits appear symmetric bilaterally. Other: Mastoid air cells clear. CT CERVICAL SPINE FINDINGS Alignment: There is 2 mm of  anterolisthesis of C4 on C5. There is 1 mm of retrolisthesis of C5 on C6. No other spondylolisthesis evident. Skull base and vertebrae: Skull base and craniocervical junction regions appear normal. No evident fracture. There are no blastic or lytic bone lesions. Soft tissues and spinal canal: Prevertebral soft tissues appear within normal limits. Predental space region is normal. No paraspinous lesion. No cord or canal hematoma evident. Disc levels: There  is severe disc space narrowing at C4-5, C5-6, and C6-7. There are anterior osteophytes at C4, C5, and C6. There is multilevel facet osteoarthritic change. There is exit foraminal narrowing due to bony hypertrophy at C5-6 and C6-7 on the left with impression on the exiting nerve roots at these levels. There is no frank disc extrusion or stenosis. Upper chest: There is asymmetric pleural thickening in the right apex with question of pleural effusion in this area. Lung apices otherwise are clear. Other: None IMPRESSION: CT head: Mild atrophy with slight periventricular small vessel disease. No mass or hemorrhage. No extra-axial fluid or acute infarct. Mild vascular calcification noted. CT cervical spine: No fracture. Areas of mild spondylolisthesis at C4-5 and C5-6 are felt to be due to underlying spondylosis. There is multilevel osteoarthritic change. Exit foraminal narrowing due to bony hypertrophy is noted on the left at C5-6 and C6-7. No disc extrusion evident. Asymmetric apical pleural thickening on the right with questionable layering pleural effusion on the right. Electronically Signed   By: Lowella Grip III M.D.   On: 10/29/2017 14:39   Ct Cervical Spine Wo Contrast  Result Date: 10/29/2017 CLINICAL DATA:  Confusion and weakness.  Unwitnessed fall EXAM: CT HEAD WITHOUT CONTRAST CT CERVICAL SPINE WITHOUT CONTRAST TECHNIQUE: Multidetector CT imaging of the head and cervical spine was performed following the standard protocol without intravenous  contrast. Multiplanar CT image reconstructions of the cervical spine were also generated. COMPARISON:  Head CT October 13, 2017 FINDINGS: CT HEAD FINDINGS Brain: There is mild diffuse atrophy. There is no intracranial mass, hemorrhage, extra-axial fluid collection, or midline shift. There is slight small vessel disease in the centra semiovale bilaterally. Elsewhere gray-white compartments are normal. No evident acute infarct. Vascular: No hyperdense vessel. There is minimal calcification in each carotid siphon. Skull: The bony calvarium appears intact. Sinuses/Orbits: Visualized paranasal sinuses are clear. Orbits appear symmetric bilaterally. Other: Mastoid air cells clear. CT CERVICAL SPINE FINDINGS Alignment: There is 2 mm of anterolisthesis of C4 on C5. There is 1 mm of retrolisthesis of C5 on C6. No other spondylolisthesis evident. Skull base and vertebrae: Skull base and craniocervical junction regions appear normal. No evident fracture. There are no blastic or lytic bone lesions. Soft tissues and spinal canal: Prevertebral soft tissues appear within normal limits. Predental space region is normal. No paraspinous lesion. No cord or canal hematoma evident. Disc levels: There is severe disc space narrowing at C4-5, C5-6, and C6-7. There are anterior osteophytes at C4, C5, and C6. There is multilevel facet osteoarthritic change. There is exit foraminal narrowing due to bony hypertrophy at C5-6 and C6-7 on the left with impression on the exiting nerve roots at these levels. There is no frank disc extrusion or stenosis. Upper chest: There is asymmetric pleural thickening in the right apex with question of pleural effusion in this area. Lung apices otherwise are clear. Other: None IMPRESSION: CT head: Mild atrophy with slight periventricular small vessel disease. No mass or hemorrhage. No extra-axial fluid or acute infarct. Mild vascular calcification noted. CT cervical spine: No fracture. Areas of mild  spondylolisthesis at C4-5 and C5-6 are felt to be due to underlying spondylosis. There is multilevel osteoarthritic change. Exit foraminal narrowing due to bony hypertrophy is noted on the left at C5-6 and C6-7. No disc extrusion evident. Asymmetric apical pleural thickening on the right with questionable layering pleural effusion on the right. Electronically Signed   By: Lowella Grip III M.D.   On: 10/29/2017 14:39   Dg  Chest Port 1 View  Result Date: 10/29/2017 CLINICAL DATA:  Shortness of Breath and bruising EXAM: PORTABLE CHEST 1 VIEW COMPARISON:  October 13, 2017 FINDINGS: There is focal opacity in the right mid lung region. Lungs elsewhere clear. No pneumothorax. Heart size and pulmonary vascularity are normal. No adenopathy. No fracture evident. IMPRESSION: Opacity right mid lung region. Question pneumonia versus potential contusion given the clinical history. Lungs elsewhere clear. Stable cardiac silhouette. No evident pneumothorax. Electronically Signed   By: Lowella Grip III M.D.   On: 10/29/2017 12:46    EKG: Independently reviewed.   Assessment/Plan Active Problems:   Encephalopathy  --Lactulose 42m tid,  --continue to monitor to see if confusion improves   Ascitics --IR will do paracentesis. Labs ordered to evaluate for abdominal infection   Sepsis -- Pharmacy consulted for antibiotics, pt receiving IV fluids  Cirrhosis of Liver --Gi consulted  SIvory BroadPA-C will see to assist in care       DVT prophylaxis: none,   SCD's   Pt is at high risk for bleeding  Code Status: Full  Family Communication: Husband at bedside  Disposition Plan: Admission   Consults called: Gi consult/ Palliative care consult  Admission status: Inpatient    KAlyse LowPA-C [facility]  If 7PM-7AM, please contact night-coverage [email or web address] Password [email or web address] 10/29/2017, 3:16 PM

## 2017-10-29 NOTE — Consult Note (Signed)
[facility] Consult: 3:33 PM 10/29/2017  LOS: 0 days    Referring Provider: ED   Primary Care Physician:  Patient, No Pcp Per is supposed to get established with a PMD at [facility] Primary Gastroenterologist:  unassigned .  Previously a patient of [facility] in [facility] where she was apparently transplant listed Recently seen by Dr. Mikeal Hawthorne at [facility]  Reason for Consultation: Hepatic encephalopathy   HPI: Megan Roberson is a 61 y.o. female.  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  Previously obese..  Coagulopathy.  Thrombocytopenia.  Anemia.  Depression.  Neuropathy.  Ascites requiring multiple paracentesis.  Left elbow and hip fracture ORIFs in [place] 03/2017.  Status post thoracentesis.  Osteopenia.  History facial fracture.  Hypothyroidism Previous esophageal variceal bleeding treated with banding.  Last EGD may have been about 5 years ago along with colonoscopy.  Hx colon polyps.  Taking iron for iron deficiency anemia.   Previous meld score of 15 when she was living in [place] per her husband's recall. Admitted to [facility] late October 2018 and seen by GI/hepatology, Dr. Trixie Deis Bonkovsky.   Ultrasound of the abdomen on 08/29/17 showed cholelithiasis.  Nonobstructing right nephrolithiasis. Splenomegaly.  Portal hypertension.  Hepatic cirrhosis with ill-defined, nonspecific subcentimeter echogenic lesions.  PT was 14.1, INR 1.3. His assessment was that of advanced, decompensated cirrhosis.  Plan was to return to the liver clinic in 4 months at which time she would be scheduled for EGD and repeat colonoscopy.   Hgb was 8.1.  Platelets were 23K.  AFP level 4.12.  Multiple labs obtained to repeat the workup of cirrhosis did not confirm any  autoimmune component, iron overload etc. She received transfusion with platelets during that admission as well as on 10/05/17.  She was in the [facility] ED on 10/05/17 -10/06/17 complaining of shortness of breath.  She underwent diagnostic paracentesis which was negative for SBP.  She was treated for hypertensive urgency.  Troponins were elevated and felt likely secondary to demand ischemia.  TTE showed mildly dilated right atrium and LVEF 65-70%.  Hypokalemia was supplemented.  Hgb was 11.6 and platelets 42K. Furosemide 40 mg daily was added to her medical regimen along with the Aldactone 50 mg daily.   24-hour hospital admission 12/12 through 10/14/17 at [facility] to address hypokalemia.  Patient has not been taking her lactulose or her diuretics which consist of Aldactone 50 mg a day because it is too hard for her to get to the bathroom.  She has had innumerable falls and has bruises all over her body as a result.  Stools are brown.  She has about 1/day.  Not sure if she had a bowel movement yesterday.  No vomiting.  No nausea.  Her belly is uncomfortable from swelling.  No lower extremity edema.She has become increasingly confused.  Her husband was concerned and brought her to the [facility] ED.  Ammonia 104.  T bili 4.1.  Alkaline phosphatase 313.  AST/ALT 156/97. Hgb 10.8.  Platelets 51.  PT/INR 17/1.3. I do not see ammonia levels for comparison drawn in recent months at [facility]. Attempted paracentesis within the last 30 minutes.  However there was insufficient ascites.  When the patient and her spouse left [place] to relocate to [place], near her sister, she did not obtain records to provide to her new physicians.  She has not yet established care with a primary care physician although apparently she has an appointment coming up with somewhat at [facility].     Past Medical History:  Diagnosis Date  . Anxiety   . Cirrhosis of liver ([place])   . Depression   . Heart  murmur    "not concerned about "   . History of abdominal paracentesis   . History of blood transfusion   . Hypothyroidism   . Liver disease 2008  . Neuropathy   . Pancytopenia [facility])     Past Surgical History:  Procedure Laterality Date  . COLONOSCOPY WITH ESOPHAGOGASTRODUODENOSCOPY (EGD) AND ESOPHAGEAL DILATION (ED)    . ESOPHAGOGASTRODUODENOSCOPY W/ BANDING     Varices  . FRACTURE SURGERY Left    x 2  . HARDWARE REMOVAL Left 09/17/2017   Procedure: HARDWARE REMOVAL LEFT OLECRANON;  Surgeon: Altamese Timberlake, MD;  Location: [place];  Service: Orthopedics;  Laterality: Left;  . HIP SURGERY Left    from fracture- rod in palce  . KNEE SURGERY Bilateral    open incision for ligaments  . ORIF ELBOW FRACTURE Left 04/30/2017   Procedure: REVISION OPEN REDUCTION INTERNAL FIXATION (ORIF) ELBOW/OLECRANON FRACTURE;  Surgeon: Altamese Wood Dale, MD;  Location: [place];  Service: Orthopedics;  Laterality: Left;    Prior to Admission medications   Medication Sig Start Date End Date Taking? Authorizing Provider  furosemide (LASIX) 40 MG tablet Take 40 mg by mouth daily. 10/07/17   [provider]  gabapentin (NEURONTIN) 600 MG tablet Take 600 mg by mouth 3 (three) times daily.     [provider]  lactulose (CHRONULAC) 10 GM/15ML solution Take 20 g by mouth 3 (three) times daily. 10/06/17   [provider]  levothyroxine (SYNTHROID, LEVOTHROID) 50 MCG tablet Take 50 mcg daily before breakfast by mouth.     [provider]  ondansetron (ZOFRAN ODT) 4 MG disintegrating tablet Take 1 tablet (4 mg total) every 8 (eight) hours as needed by mouth for nausea or vomiting. 09/17/17   Ainsley Spinner, PA-C  oxyCODONE (ROXICODONE) 5 MG immediate release tablet Take 1-2 tablets (5-10 mg total) by mouth every 8 (eight) hours as needed for severe pain. 10/15/17   Isaac Bliss, Rayford Halsted, MD  potassium chloride (K-DUR,KLOR-CON) 10 MEQ tablet Take 10 mEq 2 (two) times daily by mouth.     [provider]  rifaximin (XIFAXAN) 200 MG tablet Take 200 mg by mouth 3 (three) times daily.     [provider]  sertraline (ZOLOFT) 100 MG tablet Take 100 mg at bedtime by mouth.    [provider]  spironolactone (ALDACTONE) 50 MG tablet Take 100 mg by mouth daily. 10/06/17   [provider]  traZODone (DESYREL) 50 MG tablet Take 50 mg at bedtime by mouth.    [provider]    Scheduled Meds: . lactulose  30 g Oral TID  . lidocaine      . lidocaine       Infusions: . sodium chloride    . aztreonam    . [START ON 10/30/2017] levofloxacin (LEVAQUIN) IV    . [START ON  10/30/2017] vancomycin     PRN Meds: oxyCODONE, zolpidem   Allergies as of 10/29/2017 - Review Complete 10/29/2017  Allergen Reaction Noted  . Penicillins Swelling 04/20/2017  . Sulfa antibiotics Itching and Rash 04/20/2017    Family History  Adopted: Yes    Social History   Socioeconomic History  . Marital status: Married    Spouse name: Not on file  . Number of children: Not on file  . Years of education: Not on file  . Highest education level: Not on file  Social Needs  . Financial resource strain: Not on file  . Food insecurity - worry: Not on file  . Food insecurity - inability: Not on file  . Transportation needs - medical: Not on file  . Transportation needs - non-medical: Not on file  Occupational History  . Not on file  Tobacco Use  . Smoking status: Never Smoker  . Smokeless tobacco: Never Used  Substance and Sexual Activity  . Alcohol use: No  . Drug use: No  . Sexual activity: Not Currently    Birth control/protection: Post-menopausal  Other Topics Concern  . Not on file  Social History Narrative  . Not on file    REVIEW OF SYSTEMS: Constitutional: Weakness.  Unsteady. ENT:  No nose bleeds Pulm: No new shortness of breath or cough CV:  No palpitations, no LE edema.  GU:  No hematuria, no frequency GI:  Per HPI Heme: Bruises  and bleeds easily. Transfusions: Platelet transfusions in October Neuro:  No headaches, no peripheral tingling or numbness Derm:  No itching, no rash or sores.  Endocrine:  No sweats or chills.  No polyuria or dysuria Immunization: Did not inquire as to recent immunizations. Travel:  None beyond local counties in last few months.    PHYSICAL EXAM: Vital signs in last 24 hours: Vitals:   10/29/17 1430 10/29/17 1445  BP: (!) 152/83 (!) 141/97  Pulse: 93 94  Resp: 17 14  Temp:    SpO2: 96% 95%   Wt Readings from Last 3 Encounters:  10/29/17 65.8 kg (145 lb)  10/13/17 60.2 kg (132 lb 11.5 oz)  09/17/17 61.2 kg (135 lb)    General: Patient looks very chronically ill.  She has bruises all over her body. Head: Symmetric facies.  Some bruising on the face.  No lacerations. Eyes: No scleral icterus.  No conjunctival pallor. Ears: Not hard of hearing Nose: No congestion or discharge Mouth: Moist, clear oral mucosa.  Poor dentition. Neck: No JVD, no masses.  Bruising at the base of the right neck Lungs: Clear bilaterally.  No labored breathing or cough. Heart: RRR.  No MRG.  S1, S2 present. Abdomen: Tense.  Distended.  Umbilical hernia.  Muffled, hypoactive bowel sounds.  Slight, diffuse tenderness which is not focal..   Rectal: Deferred Musc/Skeltl: Deformities in the fingers. Extremities: No pedal edema. Neurologic: Follows commands.  Alert.  Confused. Skin: Multiple purpura small and large on the limbs, her back, her abdomen neck. Psych: Subdued affect.  Not agitated.  Intake/Output from previous day: No intake/output data recorded. Intake/Output this shift: Total I/O In: 1050 [IV Piggyback:1050] Out: -   LAB RESULTS: Recent Labs    10/29/17 1215  WBC 11.4*  HGB 10.8*  HCT 34.3*  PLT 51*   BMET Lab Results  Component Value Date   NA 139 10/29/2017   NA 138 10/14/2017   NA 136 10/13/2017   K 3.8 10/29/2017   K 3.0 (L) 10/14/2017  K 2.8 (L) 10/13/2017   CL  100 (L) 10/29/2017   CL 103 10/14/2017   CL 97 (L) 10/13/2017   CO2 26 10/29/2017   CO2 28 10/14/2017   CO2 28 10/13/2017   GLUCOSE 65 10/29/2017   GLUCOSE 110 (H) 10/14/2017   GLUCOSE 105 (H) 10/13/2017   BUN 5 (L) 10/29/2017   BUN 10 10/14/2017   BUN 10 10/13/2017   CREATININE 0.79 10/29/2017   CREATININE 0.72 10/14/2017   CREATININE 0.66 10/13/2017   CALCIUM 8.5 (L) 10/29/2017   CALCIUM 8.6 (L) 10/14/2017   CALCIUM 9.1 10/13/2017   LFT Recent Labs    10/29/17 1215  PROT 6.0*  ALBUMIN 2.7*  AST 156*  ALT 97*  ALKPHOS 313*  BILITOT 4.1*   PT/INR Lab Results  Component Value Date   INR 1.39 10/29/2017   INR 1.41 10/13/2017   INR 1.45 09/17/2017   Hepatitis Panel No results for input(s): HEPBSAG, HCVAB, HEPAIGM, HEPBIGM in the last 72 hours. C-Diff No components found for: CDIFF Lipase  No results found for: LIPASE  Drugs of Abuse  No results found for: LABOPIA, COCAINSCRNUR, LABBENZ, AMPHETMU, THCU, LABBARB   RADIOLOGY STUDIES: Ct Head Wo Contrast  Result Date: 10/29/2017 CLINICAL DATA:  Confusion and weakness.  Unwitnessed fall EXAM: CT HEAD WITHOUT CONTRAST CT CERVICAL SPINE WITHOUT CONTRAST TECHNIQUE: Multidetector CT imaging of the head and cervical spine was performed following the standard protocol without intravenous contrast. Multiplanar CT image reconstructions of the cervical spine were also generated. COMPARISON:  Head CT October 13, 2017 FINDINGS: CT HEAD FINDINGS Brain: There is mild diffuse atrophy. There is no intracranial mass, hemorrhage, extra-axial fluid collection, or midline shift. There is slight small vessel disease in the centra semiovale bilaterally. Elsewhere gray-white compartments are normal. No evident acute infarct. Vascular: No hyperdense vessel. There is minimal calcification in each carotid siphon. Skull: The bony calvarium appears intact. Sinuses/Orbits: Visualized paranasal sinuses are clear. Orbits appear symmetric bilaterally.  Other: Mastoid air cells clear. CT CERVICAL SPINE FINDINGS Alignment: There is 2 mm of anterolisthesis of C4 on C5. There is 1 mm of retrolisthesis of C5 on C6. No other spondylolisthesis evident. Skull base and vertebrae: Skull base and craniocervical junction regions appear normal. No evident fracture. There are no blastic or lytic bone lesions. Soft tissues and spinal canal: Prevertebral soft tissues appear within normal limits. Predental space region is normal. No paraspinous lesion. No cord or canal hematoma evident. Disc levels: There is severe disc space narrowing at C4-5, C5-6, and C6-7. There are anterior osteophytes at C4, C5, and C6. There is multilevel facet osteoarthritic change. There is exit foraminal narrowing due to bony hypertrophy at C5-6 and C6-7 on the left with impression on the exiting nerve roots at these levels. There is no frank disc extrusion or stenosis. Upper chest: There is asymmetric pleural thickening in the right apex with question of pleural effusion in this area. Lung apices otherwise are clear. Other: None IMPRESSION: CT head: Mild atrophy with slight periventricular small vessel disease. No mass or hemorrhage. No extra-axial fluid or acute infarct. Mild vascular calcification noted. CT cervical spine: No fracture. Areas of mild spondylolisthesis at C4-5 and C5-6 are felt to be due to underlying spondylosis. There is multilevel osteoarthritic change. Exit foraminal narrowing due to bony hypertrophy is noted on the left at C5-6 and C6-7. No disc extrusion evident. Asymmetric apical pleural thickening on the right with questionable layering pleural effusion on the right. Electronically Signed   By:  Lowella Grip III M.D.   On: 10/29/2017 14:39   Ct Cervical Spine Wo Contrast  Result Date: 10/29/2017 CLINICAL DATA:  Confusion and weakness.  Unwitnessed fall EXAM: CT HEAD WITHOUT CONTRAST CT CERVICAL SPINE WITHOUT CONTRAST TECHNIQUE: Multidetector CT imaging of the head and  cervical spine was performed following the standard protocol without intravenous contrast. Multiplanar CT image reconstructions of the cervical spine were also generated. COMPARISON:  Head CT October 13, 2017 FINDINGS: CT HEAD FINDINGS Brain: There is mild diffuse atrophy. There is no intracranial mass, hemorrhage, extra-axial fluid collection, or midline shift. There is slight small vessel disease in the centra semiovale bilaterally. Elsewhere gray-white compartments are normal. No evident acute infarct. Vascular: No hyperdense vessel. There is minimal calcification in each carotid siphon. Skull: The bony calvarium appears intact. Sinuses/Orbits: Visualized paranasal sinuses are clear. Orbits appear symmetric bilaterally. Other: Mastoid air cells clear. CT CERVICAL SPINE FINDINGS Alignment: There is 2 mm of anterolisthesis of C4 on C5. There is 1 mm of retrolisthesis of C5 on C6. No other spondylolisthesis evident. Skull base and vertebrae: Skull base and craniocervical junction regions appear normal. No evident fracture. There are no blastic or lytic bone lesions. Soft tissues and spinal canal: Prevertebral soft tissues appear within normal limits. Predental space region is normal. No paraspinous lesion. No cord or canal hematoma evident. Disc levels: There is severe disc space narrowing at C4-5, C5-6, and C6-7. There are anterior osteophytes at C4, C5, and C6. There is multilevel facet osteoarthritic change. There is exit foraminal narrowing due to bony hypertrophy at C5-6 and C6-7 on the left with impression on the exiting nerve roots at these levels. There is no frank disc extrusion or stenosis. Upper chest: There is asymmetric pleural thickening in the right apex with question of pleural effusion in this area. Lung apices otherwise are clear. Other: None IMPRESSION: CT head: Mild atrophy with slight periventricular small vessel disease. No mass or hemorrhage. No extra-axial fluid or acute infarct. Mild  vascular calcification noted. CT cervical spine: No fracture. Areas of mild spondylolisthesis at C4-5 and C5-6 are felt to be due to underlying spondylosis. There is multilevel osteoarthritic change. Exit foraminal narrowing due to bony hypertrophy is noted on the left at C5-6 and C6-7. No disc extrusion evident. Asymmetric apical pleural thickening on the right with questionable layering pleural effusion on the right. Electronically Signed   By: Lowella Grip III M.D.   On: 10/29/2017 14:39   Dg Chest Port 1 View  Result Date: 10/29/2017 CLINICAL DATA:  Shortness of Breath and bruising EXAM: PORTABLE CHEST 1 VIEW COMPARISON:  October 13, 2017 FINDINGS: There is focal opacity in the right mid lung region. Lungs elsewhere clear. No pneumothorax. Heart size and pulmonary vascularity are normal. No adenopathy. No fracture evident. IMPRESSION: Opacity right mid lung region. Question pneumonia versus potential contusion given the clinical history. Lungs elsewhere clear. Stable cardiac silhouette. No evident pneumothorax. Electronically Signed   By: Lowella Grip III M.D.   On: 10/29/2017 12:46     IMPRESSION:   *    Hepatic encephalopathy.  Has not been compliant with her lactulose but has been taking her rifaximin.  *    Ascites, recurrent.  Per history has required multiple previous paracentesis.  The last paracentesis I saw was diagnostic only, 50 cc tap performed 10/06/17.  It did not confirm SBP.  Insufficient ascites for diagnostic paracentesis this afternoon.  Doubt SBP.  *    Hepatic cirrhosis.  Diagnosis of  this felt to be alcohol and Nash related.  Per the husband's history, she was transplant listed, previous meld score was 15. Current meld score 15 as well.  She shows significant signs of decompensation,  *   Possible pneumonia.  Started on antibiotics.    PLAN:     *   Agree with dosing lactulose 30 g 3 times daily.  Treat the pneumonia if you feel that is present.  Azucena Freed  10/29/2017, 3:33 PM Pager: [phone]  I have reviewed the entire case in detail with the above APP and discussed the plan in detail.  Therefore, I agree with the diagnoses recorded above. In addition,  I have personally interviewed and examined the patient and have personally reviewed any abdominal/pelvic CT scan images.  My additional thoughts are as follows:  At the time of my exam, this patient is in distress, writhing in pain related to back, hip and generalized abdominal pain.  She appears confused, and her husband corroborates this.  She does not take lactulose at home as directed, and now has decompensation with encephalopathy.  She has evidence of multiple falls, and it sounds like her husband Nicole Kindred has great difficulty caring for her, reporting "I can't turn my back on her".  She is emaciated, chronically, ill appearing and in distress.  Her abdomen is markedly distended , tympanitic and tender.  She is very anxious and hyperventilating.  She has a modest MELD score, no ascites seen on U/S today (so no SBP), no overt GI bleeding, and questionable pneumonia on CXR. Mild leukocytosis, chronic thrombocytopenia. INR 1.4 (baseline)  We reviewed CMP and [place] reports from recent [facility] workup.  I am very concerned about her abdominal exam.  I personally reviewed.the CXR, and no free air is seen.  There does seem to be suggestion of colonic dilatation in the LUQ.  We will await result of CT scan that will be done very shortly, according to nursing.  Further plans to follow that.  When the patient gets through this acute illness, she certainly needs better compliance with HE regimen.  She is critically ill and we will follow.  Nelida Meuse III Pager [phone]  Mon-Fri 8a-5p [phone] after 5p, weekends, holidays

## 2017-10-29 NOTE — ED Notes (Signed)
Pt transported to IR 

## 2017-10-30 ENCOUNTER — Inpatient Hospital Stay (HOSPITAL_COMMUNITY): Payer: Medicare Other

## 2017-10-30 DIAGNOSIS — K7682 Hepatic encephalopathy: Secondary | ICD-10-CM | POA: Diagnosis present

## 2017-10-30 DIAGNOSIS — R109 Unspecified abdominal pain: Secondary | ICD-10-CM | POA: Diagnosis present

## 2017-10-30 DIAGNOSIS — R7989 Other specified abnormal findings of blood chemistry: Secondary | ICD-10-CM | POA: Diagnosis present

## 2017-10-30 DIAGNOSIS — R1084 Generalized abdominal pain: Secondary | ICD-10-CM

## 2017-10-30 DIAGNOSIS — Z515 Encounter for palliative care: Secondary | ICD-10-CM

## 2017-10-30 DIAGNOSIS — E876 Hypokalemia: Secondary | ICD-10-CM

## 2017-10-30 DIAGNOSIS — R140 Abdominal distension (gaseous): Secondary | ICD-10-CM

## 2017-10-30 DIAGNOSIS — K3189 Other diseases of stomach and duodenum: Secondary | ICD-10-CM

## 2017-10-30 DIAGNOSIS — S32009A Unspecified fracture of unspecified lumbar vertebra, initial encounter for closed fracture: Secondary | ICD-10-CM

## 2017-10-30 DIAGNOSIS — K7200 Acute and subacute hepatic failure without coma: Secondary | ICD-10-CM

## 2017-10-30 DIAGNOSIS — K7290 Hepatic failure, unspecified without coma: Secondary | ICD-10-CM | POA: Diagnosis present

## 2017-10-30 DIAGNOSIS — D696 Thrombocytopenia, unspecified: Secondary | ICD-10-CM | POA: Diagnosis present

## 2017-10-30 LAB — COMPREHENSIVE METABOLIC PANEL
ALT: 79 U/L — ABNORMAL HIGH (ref 14–54)
AST: 127 U/L — ABNORMAL HIGH (ref 15–41)
Albumin: 2.4 g/dL — ABNORMAL LOW (ref 3.5–5.0)
Alkaline Phosphatase: 274 U/L — ABNORMAL HIGH (ref 38–126)
Anion gap: 7 (ref 5–15)
BUN: 7 mg/dL (ref 6–20)
CO2: 27 mmol/L (ref 22–32)
Calcium: 7.9 mg/dL — ABNORMAL LOW (ref 8.9–10.3)
Chloride: 105 mmol/L (ref 101–111)
Creatinine, Ser: 0.73 mg/dL (ref 0.44–1.00)
GFR calc Af Amer: >60 mL/min (ref >60)
GFR calc non Af Amer: >60 mL/min (ref >60)
Glucose, Bld: 75 mg/dL (ref 65–99)
Potassium: 2.9 mmol/L — ABNORMAL LOW (ref 3.5–5.1)
Sodium: 139 mmol/L (ref 135–145)
Total Bilirubin: 3.8 mg/dL — ABNORMAL HIGH (ref 0.3–1.2)
Total Protein: 5.3 g/dL — ABNORMAL LOW (ref 6.5–8.1)

## 2017-10-30 LAB — PROTIME-INR
INR: 1.58
Prothrombin Time: 18.7 seconds — ABNORMAL HIGH (ref 11.4–15.2)

## 2017-10-30 LAB — LACTIC ACID, PLASMA: Lactic Acid, Venous: 1.3 mmol/L (ref 0.5–1.9)

## 2017-10-30 LAB — GLUCOSE, CAPILLARY
Glucose-Capillary: 104 mg/dL — ABNORMAL HIGH (ref 65–99)
Glucose-Capillary: 123 mg/dL — ABNORMAL HIGH (ref 65–99)
Glucose-Capillary: 72 mg/dL (ref 65–99)

## 2017-10-30 LAB — CBC
HCT: 31.1 % — ABNORMAL LOW (ref 36.0–46.0)
Hemoglobin: 9.4 g/dL — ABNORMAL LOW (ref 12.0–15.0)
MCH: 29 pg (ref 26.0–34.0)
MCHC: 30.2 g/dL (ref 30.0–36.0)
MCV: 96 fL (ref 78.0–100.0)
Platelets: 41 10*3/uL — ABNORMAL LOW (ref 150–400)
RBC: 3.24 MIL/uL — ABNORMAL LOW (ref 3.87–5.11)
RDW: 20.1 % — ABNORMAL HIGH (ref 11.5–15.5)
WBC: 9.7 10*3/uL (ref 4.0–10.5)

## 2017-10-30 LAB — PHOSPHORUS: Phosphorus: 2.8 mg/dL (ref 2.5–4.6)

## 2017-10-30 LAB — POTASSIUM: Potassium: 3.2 mmol/L — ABNORMAL LOW (ref 3.5–5.1)

## 2017-10-30 LAB — AMMONIA: Ammonia: 45 umol/L — ABNORMAL HIGH (ref 9–35)

## 2017-10-30 LAB — MAGNESIUM: Magnesium: 1.9 mg/dL (ref 1.7–2.4)

## 2017-10-30 MED ORDER — POTASSIUM CHLORIDE 10 MEQ/100ML IV SOLN: 10.0000 meq | INTRAVENOUS | Status: DC

## 2017-10-30 MED ORDER — KETOROLAC TROMETHAMINE 15 MG/ML IJ SOLN
15.0000 mg | Freq: Once | INTRAMUSCULAR | Status: AC
  Administered 2017-10-30: 15 mg via INTRAVENOUS
  Filled 2017-10-30: qty 1

## 2017-10-30 MED ORDER — GUAIFENESIN 200 MG PO TABS
200.0000 mg | ORAL_TABLET | Freq: Once | ORAL | Status: AC
  Administered 2017-10-30: 200 mg via ORAL
  Filled 2017-10-30: qty 1

## 2017-10-30 MED ORDER — INSULIN ASPART 100 UNIT/ML ~~LOC~~ SOLN
0.0000 [IU] | SUBCUTANEOUS | Status: DC
  Administered 2017-10-31 (×3): 2 [IU] via SUBCUTANEOUS
  Administered 2017-10-31: 3 [IU] via SUBCUTANEOUS

## 2017-10-30 MED ORDER — KETOROLAC TROMETHAMINE 30 MG/ML IJ SOLN
30.0000 mg | Freq: Two times a day (BID) | INTRAMUSCULAR | Status: AC
  Administered 2017-10-30 – 2017-11-03 (×10): 30 mg via INTRAVENOUS
  Filled 2017-10-30 (×10): qty 1

## 2017-10-30 MED ORDER — LORAZEPAM 2 MG/ML IJ SOLN
2.0000 mg | Freq: Once | INTRAMUSCULAR | Status: AC
  Administered 2017-10-31: 4 mg via INTRAVENOUS
  Filled 2017-10-30: qty 2

## 2017-10-30 MED ORDER — MORPHINE SULFATE (PF) 4 MG/ML IV SOLN
2.0000 mg | Freq: Once | INTRAVENOUS | Status: AC
  Administered 2017-10-31: 2 mg via INTRAVENOUS
  Filled 2017-10-30: qty 1

## 2017-10-30 MED ORDER — POTASSIUM CHLORIDE CRYS ER 20 MEQ PO TBCR
40.0000 meq | EXTENDED_RELEASE_TABLET | Freq: Once | ORAL | Status: AC
  Administered 2017-10-30: 40 meq via ORAL
  Filled 2017-10-30: qty 2

## 2017-10-30 MED ORDER — LACTULOSE ENEMA
300.0000 mL | Freq: Four times a day (QID) | ORAL | Status: DC
  Administered 2017-10-30 – 2017-10-31 (×2): 300 mL via RECTAL
  Filled 2017-10-30 (×9): qty 300

## 2017-10-30 MED ORDER — DEXAMETHASONE SODIUM PHOSPHATE 4 MG/ML IJ SOLN
4.0000 mg | Freq: Four times a day (QID) | INTRAMUSCULAR | Status: DC
  Administered 2017-10-30 – 2017-10-31 (×3): 4 mg via INTRAVENOUS
  Filled 2017-10-30 (×3): qty 1

## 2017-10-30 MED ORDER — GABAPENTIN 300 MG PO CAPS
600.0000 mg | ORAL_CAPSULE | Freq: Three times a day (TID) | ORAL | Status: DC
  Administered 2017-10-30 – 2017-11-01 (×5): 600 mg via ORAL
  Filled 2017-10-30 (×5): qty 2

## 2017-10-30 MED ORDER — POTASSIUM CHLORIDE 2 MEQ/ML IV SOLN
INTRAVENOUS | Status: DC
  Administered 2017-10-30 – 2017-11-02 (×6): via INTRAVENOUS
  Filled 2017-10-30 (×8): qty 1000

## 2017-10-30 MED ORDER — ALPRAZOLAM 0.25 MG PO TABS
0.2500 mg | ORAL_TABLET | Freq: Once | ORAL | Status: AC
  Administered 2017-10-30: 0.25 mg via ORAL
  Filled 2017-10-30: qty 1

## 2017-10-30 NOTE — Progress Notes (Signed)
[facility] Progress Note  Chief Complaint: Abdominal distention  Subjective  History:  Overnight this patient remains about the same, with marked abdominal distention with generalized pain causing anxiety. She has severe back pain for which she has received a few doses of opioids overnight. Her lactulose so far has produced no bowel movements since admission. She continues to have altered mental status, is somnolent and her husband says she is disoriented. There has been no vomiting, and she has been kept nothing by mouth.  ROS: She can provide no helpful additional review of systems other than to say she has pain everywhere.  Objective:  Med list reviewed  Vital signs in last 24 hrs: Vitals:   10/30/17 0800 10/30/17 1212  BP: 133/83 128/78  Pulse: 94 97  Resp: 18 18  Temp:  97.6 F (36.4 C)  SpO2: 98% 96%    Physical Exam Cachectic chronically ill woman with many bruises over her entire body as previously noted. The patient has reportedly had multiple falls every day at home for quite some time.  HEENT: sclera anicteric, oral mucosa moist without lesions  Neck: supple, no thyromegaly, JVD or lymphadenopathy  Cardiac: RRR without murmurs, S1S2 heard, no peripheral edema  Pulm: clear to auscultation bilaterally, normal RR and fair effort noted, low lung volumes  Abdomen: soft leak but severely distended and tympanitic with hollow high pitched bowel sounds. Diffuse tenderness, cannot assess hepatosplenomegaly due to the distention.  Skin; warm and dry, many  ecchymoses  Recent Labs:  Recent Labs  Lab 10/29/17 1215 10/30/17 0831  WBC 11.4* 9.7  HGB 10.8* 9.4*  HCT 34.3* 31.1*  PLT 51* 41*   Recent Labs  Lab 10/30/17 0831  NA 139  K 2.9*  CL 105  CO2 27  BUN 7  ALBUMIN 2.4*  ALKPHOS 274*  ALT 79*  AST 127*  GLUCOSE 75   Recent Labs  Lab 10/30/17 0831  INR 1.58    Radiologic studies:  I personally reviewed the CT scan from last evening, and also  went over the findings in detail with the radiologist on call last evening. He see my progress note from late last evening for details. Also has acute to subacute spinal fractures  I have also personally reviewed the KUB from this morning and discuss the findings with red, Dr. Jobe Igo. It is difficult to tell the exact colonic diameter, because they're overlying loops of colon and small bowel. Overall, does not seem to be changed from last evening.  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  Fortunately, she is not acidemic and I do not fingers ischemic compromise of the bowel at this time. Maximal: Diameter 7 cm on CT last evening. There is no dilatation of the descending colon, but that is probably because there is an unknown process causing diffuse severe wall thickening. There is no apparent mechanical obstruction in the sigmoid where the dilatation and's. There is no evidence of portion and no volvulus. The colon is markedly redundant.  Advanced cirrhosis from previous alcohol abuse  Portal hypertension, but no ascites at present  Hepatic encephalopathy exacerbated by acute illness  Severe hypokalemia  Plan:  I discussed her case at length with her husband Nicole Kindred, her nurse and the attending physician Dr. Sherral Hammers.  She does not need a decompression endoscopy/colonoscopy at present. There is no evidence of ischemic compromise and the maximal diameter 7 cm. We could  potentially make her worse I insufflating air for the procedure.  Ideally, which she needs his discontinuation of opioids and ambulation, though the latter may not be feasible. She also needs aggressive repletion of potassium.  I would keep her on a clear liquid diet for now, and we must discontinue the lactulose because we'll probably make the bloating and distention worse. We will have to be  given by enema and I would just try couple of doses to see if there is any effect and make sure it does not worsen her distention or abdominal pain.  Overall, this is a very complex situation with limited options. She is debilitated with multiple chronic illnesses. I agree entirely with the palliative care consult since this patient's long-term prognosis is poor.  Total time 45 minutes today, over half spent in discussion with patient's husband, caregivers, and in review of radiologic procedures and formation of her plan.  Nelida Meuse III Pager [phone] Mon-Fri 8a-5p [phone] after 5p, weekends, holidays

## 2017-10-30 NOTE — Progress Notes (Signed)
PROGRESS NOTE    Megan Roberson  IRJ:[id number] DOB: 06/16/1956 DOA: 10/29/2017 PCP: Patient, No Pcp Per   Brief Narrative:  Megan Roberson is a 61 y.o.WF  PMHx Anxiety, Depression,Heart murmur , Hypothyroidism, Pancytopenia, neuropathy, Cirhosis of liver.   Patient discharged on 10/14/2017 Brought in today by her husband who reports pt has increased confusion and frequent falls.  Husband was recently away.  He reports pt has had multiple falls while he has been gone.  He reports pt is very unsteady.  Pt has had increasing confusion.  He also reports she has had increased abdominal swelling.  He reports pt has bruises all over   Pt reports she used to drink a lot.  She reports she stopped drinking 6 years ago.   Pt reports she was on liver transplant list in [place] until she moved here.         ED Course:  Pt seen in the ED and had labs.  Pt has an ammonia level of 104, lactic acid over 4.  INR is 1.30.  Pt was noted to have hit her head recently when she fell. Ct in ED showed no acute abnormality. Ultrasound guided paracentesis has been ordered.    Subjective: 12/29  sleepy but arousable. A/O 3 (does not know where), does know she is a hospital in [place]. Negative CP, negative SOB. Positive abdominal pain, positive lower back pain. Patient stated he wanted her husband to make decisions concerning CODE STATUS and short-term  Vs long-term goals of care.    Assessment & Plan:   Active Problems:   Encephalopathy  Lactic acidosis -Normalized  Ogilvie Syndrome -Correct electrolytes aggressively -KUB in A.m. to monitor:Colonic Diameter. Will require q 24 hour monitoring. -Per GI not candidate for decompression at this time. -L-spine MRI pending  Ascites -Per EMR last paracentesis 1 week ago 5 L fluid removed -Patient evaluated by IR no ascites therefore no paracentesis performed. --IR will do paracentesis. Labs ordered to evaluate for abdominal infection     Sepsis -Patient does not meet guidelines for sepsis at time of admission -Patient does have lactic acidosis, which could be secondary to her multiple falls, dehydration. -Pharmacy consulted on admission for antibiotics -Negative fever, negative leukocytosis, negative left shift negative bands. However given patient's high mortality rate will continue antibiotics for another 24 hours if does not show signs of sepsis will DC/de-escalate antibiotics.  SBP -Possible, but unlikely.   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  Increased Ammonia level/hepatic encephalopathy -Lactulose enema QID -Avoid lactulose PO this will increase patient's abdominal distention.  Thrombocytopenia (baseline 30-50 K) -At baseline -INR= 1.58  Hypokalemia -Normal saline +Potassium IV 60 mEq -K Dur 40 mEq  -Repeat K/Mg/Po4@ 1800  Hypocalcemia  Acute/Subacute L-spine fracture -L-spine MRI pending -Decadron 4 mg QID -Moderate SSI  Pain control -Toradol -Gabapentin    DVT prophylaxis: SCD Code Status: Full Family Communication: Spoke at length with husband, father and sister concerning high mortality rate and significant probability that patient will not survive this hospitalization, given her multiple medical problems. Husband wanted to speak with the remainder of family prior to deciding on change of CODE STATUS, short-term vs long-term goals of care. Disposition Plan: TBD   Consultants:  GI IR   Procedures/Significant Events:  CT C-spine/CT head WO contrast:  IMPRESSION: CT head: Mild atrophy with slight periventricular small vessel disease. No mass or hemorrhage. No extra-axial fluid or acute infarct.   Mild vascular calcification noted.  CT cervical spine: No fracture. Areas of mild spondylolisthesis at C4-5 and C5-6 are felt to be due to underlying spondylosis.   There is multilevel osteoarthritic change. Exit foraminal  narrowing due to bony hypertrophy is noted on the left at C5-6 and C6-7. No disc extrusion evident.   Asymmetric apical pleural thickening on the right with questionable layering pleural effusion on the right.     12/28 CT abdomen pelvis W contrast:-Mild small bowel distension with prominent gaseous distention of the transverse colon. No obstructing lesion is identified and there is wall thickening in the ascending colon suggesting infectious/inflammatory colitis. 2. Diffuse mesenteric and pericolonic edema with small volume free fluid in the cul-de-sac. 3. Patchy airspace disease at the right base suggests pneumonia. Tiny bilateral pleural effusions are associated with some probable loculation of the right effusion. 4. Cirrhotic changes in the liver with evidence of portal venous hypertension. 5. Right sacral fracture with fractures of the right L4 and bilateral L5 transverse process ease. These fractures are acute to subacute appearance by CT. 6. Old left iliac bone fracture with chronic compression deformity at T12 and L1. Patient is status post ORIF for left femoral neck fracture. 7. Cholelithiasis 8. 7 x 13 x 11 mm nonobstructing stone in the right renal pelvis. The    I have personally reviewed and interpreted all radiology studies and my findings are as above.  VENTILATOR SETTINGS:    Cultures -12/28 blood NGTD -12/28 MRSA by PCR negative -12/29 urine pending   Antimicrobials: Anti-infectives (From admission, onward)   Start     Stop   10/30/17 1400  levofloxacin (LEVAQUIN) IVPB 750 mg         10/30/17 0200  vancomycin (VANCOCIN) IVPB 750 mg/150 ml premix         10/29/17 2200  aztreonam (AZACTAM) 2 g in dextrose 5 % 50 mL IVPB         10/29/17 1700  rifaximin (XIFAXAN) tablet 200 mg         10/29/17 1315  levofloxacin (LEVAQUIN) IVPB 750 mg     10/29/17 1511   10/29/17 1315  aztreonam (AZACTAM) 2 g in dextrose 5 % 50 mL IVPB     10/29/17 1410    10/29/17 1315  vancomycin (VANCOCIN) IVPB 1000 mg/200 mL premix     10/29/17 1442       Devices    LINES / TUBES:      Continuous Infusions: . sodium chloride 100 mL/hr at 10/30/17 0510  . aztreonam 2 g (10/30/17 1232)  . levofloxacin (LEVAQUIN) IV    . vancomycin Stopped (10/30/17 0256)     Objective: Vitals:   10/30/17 0413 10/30/17 0746 10/30/17 0800 10/30/17 1212  BP: (!) 125/96 129/72 133/83 128/78  Pulse:  93 94 97  Resp:  16 18 18   Temp: 98.7 F (37.1 C) 99.1 F (37.3 C)  97.6 F (36.4 C)  TempSrc: Oral Oral  Oral  SpO2:  96% 98% 96%  Weight:      Height:        Intake/Output Summary (Last 24 hours) at 10/30/2017 1346 Last data filed at 10/30/2017 0800 Gross per 24 hour  Intake 2861.67 ml  Output 0 ml  Net 2861.67 ml   Filed Weights   10/29/17 1107  Weight: 145 lb (65.8 kg)    Examination:  General: Sleepy, arousable, A/O 4, No acute respiratory distress Neck:  Negative scars, masses, torticollis, lymphadenopathy, JVD Lungs: Clear to auscultation bilaterally without wheezes or crackles  Cardiovascular: Regular rate and rhythm without murmur gallop or rub normal S1 and S2 Abdomen: negative abdominal pain, nondistended, positive soft, bowel sounds, no rebound, no ascites, no appreciable mass Extremities: No significant cyanosis, clubbing, or edema bilateral lower extremities Skin: Multiple bruises on his and legs secondary to multiple falls Psychiatric:  Negative depression, negative anxiety, negative fatigue, negative mania  Central nervous system:  Cranial nerves II through XII intact, tongue/uvula midline, bilateral upper extremities muscle strength 5/5, bilateral lower extremity strength 2/5 sensation intact bilateral upper extremities, decreased sensation bilateral lower extremities. Negative dysarthria, negative expressive aphasia, negative receptive aphasia.  .     Data Reviewed: Care during the described time interval was provided by me .   I have reviewed this patient's available data, including medical history, events of note, physical examination, and all test results as part of my evaluation.   CBC: Recent Labs  Lab 10/29/17 1215 10/30/17 0831  WBC 11.4* 9.7  NEUTROABS 8.5*  --   HGB 10.8* 9.4*  HCT 34.3* 31.1*  MCV 95.5 96.0  PLT 51* 41*   Basic Metabolic Panel: Recent Labs  Lab 10/29/17 1215 10/30/17 0831  NA 139 139  K 3.8 2.9*  CL 100* 105  CO2 26 27  GLUCOSE 65 75  BUN 5* 7  CREATININE 0.79 0.73  CALCIUM 8.5* 7.9*   GFR: Estimated Creatinine Clearance: 73.6 mL/min (by C-G formula based on SCr of 0.73 mg/dL). Liver Function Tests: Recent Labs  Lab 10/29/17 1215 10/30/17 0831  AST 156* 127*  ALT 97* 79*  ALKPHOS 313* 274*  BILITOT 4.1* 3.8*  PROT 6.0* 5.3*  ALBUMIN 2.7* 2.4*   No results for input(s): LIPASE, AMYLASE in the last 168 hours. Recent Labs  Lab 10/29/17 1244  AMMONIA 104*   Coagulation Profile: Recent Labs  Lab 10/29/17 1215 10/30/17 0831  INR 1.39 1.58   Cardiac Enzymes: No results for input(s): CKTOTAL, CKMB, CKMBINDEX, TROPONINI in the last 168 hours. BNP (last 3 results) No results for input(s): PROBNP in the last 8760 hours. HbA1C: No results for input(s): HGBA1C in the last 72 hours. CBG: No results for input(s): GLUCAP in the last 168 hours. Lipid Profile: No results for input(s): CHOL, HDL, LDLCALC, TRIG, CHOLHDL, LDLDIRECT in the last 72 hours. Thyroid Function Tests: No results for input(s): TSH, T4TOTAL, FREET4, T3FREE, THYROIDAB in the last 72 hours. Anemia Panel: No results for input(s): VITAMINB12, FOLATE, FERRITIN, TIBC, IRON, RETICCTPCT in the last 72 hours. Urine analysis:    Component Value Date/Time   COLORURINE AMBER (A) 10/29/2017 1317   APPEARANCEUR CLOUDY (A) 10/29/2017 1317   LABSPEC 1.025 10/29/2017 1317   PHURINE 7.0 10/29/2017 1317   GLUCOSEU NEGATIVE 10/29/2017 1317   HGBUR LARGE (A) 10/29/2017 1317   BILIRUBINUR SMALL (A)  10/29/2017 1317   KETONESUR NEGATIVE 10/29/2017 1317   PROTEINUR 30 (A) 10/29/2017 1317   NITRITE NEGATIVE 10/29/2017 1317   LEUKOCYTESUR NEGATIVE 10/29/2017 1317   Sepsis Labs: @LABRCNTIP (procalcitonin:4,lacticidven:4)  ) Recent Results (from the past 240 hour(s))  Blood culture (routine x 2)     Status: None (Preliminary result)   Collection Time: 10/29/17 12:49 PM  Result Value Ref Range Status   Specimen Description BLOOD RIGHT ANTECUBITAL  Final   Special Requests   Final    BOTTLES DRAWN AEROBIC AND ANAEROBIC Blood Culture adequate volume   Culture NO GROWTH < 24 HOURS  Final   Report Status PENDING  Incomplete  Blood culture (routine x 2)  Status: None (Preliminary result)   Collection Time: 10/29/17 12:52 PM  Result Value Ref Range Status   Specimen Description BLOOD RIGHT WRIST  Final   Special Requests   Final    BOTTLES DRAWN AEROBIC AND ANAEROBIC Blood Culture adequate volume   Culture NO GROWTH < 24 HOURS  Final   Report Status PENDING  Incomplete  MRSA PCR Screening     Status: None   Collection Time: 10/29/17  5:32 PM  Result Value Ref Range Status   MRSA by PCR NEGATIVE NEGATIVE Final    Comment:        The GeneXpert MRSA Assay (FDA approved for NASAL specimens only), is one component of a comprehensive MRSA colonization surveillance program. It is not intended to diagnose MRSA infection nor to guide or monitor treatment for MRSA infections.          Radiology Studies: Ct Head Wo Contrast  Result Date: 10/29/2017 CLINICAL DATA:  Confusion and weakness.  Unwitnessed fall EXAM: CT HEAD WITHOUT CONTRAST CT CERVICAL SPINE WITHOUT CONTRAST TECHNIQUE: Multidetector CT imaging of the head and cervical spine was performed following the standard protocol without intravenous contrast. Multiplanar CT image reconstructions of the cervical spine were also generated. COMPARISON:  Head CT October 13, 2017 FINDINGS: CT HEAD FINDINGS Brain: There is mild diffuse  atrophy. There is no intracranial mass, hemorrhage, extra-axial fluid collection, or midline shift. There is slight small vessel disease in the centra semiovale bilaterally. Elsewhere gray-white compartments are normal. No evident acute infarct. Vascular: No hyperdense vessel. There is minimal calcification in each carotid siphon. Skull: The bony calvarium appears intact. Sinuses/Orbits: Visualized paranasal sinuses are clear. Orbits appear symmetric bilaterally. Other: Mastoid air cells clear. CT CERVICAL SPINE FINDINGS Alignment: There is 2 mm of anterolisthesis of C4 on C5. There is 1 mm of retrolisthesis of C5 on C6. No other spondylolisthesis evident. Skull base and vertebrae: Skull base and craniocervical junction regions appear normal. No evident fracture. There are no blastic or lytic bone lesions. Soft tissues and spinal canal: Prevertebral soft tissues appear within normal limits. Predental space region is normal. No paraspinous lesion. No cord or canal hematoma evident. Disc levels: There is severe disc space narrowing at C4-5, C5-6, and C6-7. There are anterior osteophytes at C4, C5, and C6. There is multilevel facet osteoarthritic change. There is exit foraminal narrowing due to bony hypertrophy at C5-6 and C6-7 on the left with impression on the exiting nerve roots at these levels. There is no frank disc extrusion or stenosis. Upper chest: There is asymmetric pleural thickening in the right apex with question of pleural effusion in this area. Lung apices otherwise are clear. Other: None IMPRESSION: CT head: Mild atrophy with slight periventricular small vessel disease. No mass or hemorrhage. No extra-axial fluid or acute infarct. Mild vascular calcification noted. CT cervical spine: No fracture. Areas of mild spondylolisthesis at C4-5 and C5-6 are felt to be due to underlying spondylosis. There is multilevel osteoarthritic change. Exit foraminal narrowing due to bony hypertrophy is noted on the left at  C5-6 and C6-7. No disc extrusion evident. Asymmetric apical pleural thickening on the right with questionable layering pleural effusion on the right. Electronically Signed   By: Lowella Grip III M.D.   On: 10/29/2017 14:39   Ct Cervical Spine Wo Contrast  Result Date: 10/29/2017 CLINICAL DATA:  Confusion and weakness.  Unwitnessed fall EXAM: CT HEAD WITHOUT CONTRAST CT CERVICAL SPINE WITHOUT CONTRAST TECHNIQUE: Multidetector CT imaging of the head and cervical  spine was performed following the standard protocol without intravenous contrast. Multiplanar CT image reconstructions of the cervical spine were also generated. COMPARISON:  Head CT October 13, 2017 FINDINGS: CT HEAD FINDINGS Brain: There is mild diffuse atrophy. There is no intracranial mass, hemorrhage, extra-axial fluid collection, or midline shift. There is slight small vessel disease in the centra semiovale bilaterally. Elsewhere gray-white compartments are normal. No evident acute infarct. Vascular: No hyperdense vessel. There is minimal calcification in each carotid siphon. Skull: The bony calvarium appears intact. Sinuses/Orbits: Visualized paranasal sinuses are clear. Orbits appear symmetric bilaterally. Other: Mastoid air cells clear. CT CERVICAL SPINE FINDINGS Alignment: There is 2 mm of anterolisthesis of C4 on C5. There is 1 mm of retrolisthesis of C5 on C6. No other spondylolisthesis evident. Skull base and vertebrae: Skull base and craniocervical junction regions appear normal. No evident fracture. There are no blastic or lytic bone lesions. Soft tissues and spinal canal: Prevertebral soft tissues appear within normal limits. Predental space region is normal. No paraspinous lesion. No cord or canal hematoma evident. Disc levels: There is severe disc space narrowing at C4-5, C5-6, and C6-7. There are anterior osteophytes at C4, C5, and C6. There is multilevel facet osteoarthritic change. There is exit foraminal narrowing due to bony  hypertrophy at C5-6 and C6-7 on the left with impression on the exiting nerve roots at these levels. There is no frank disc extrusion or stenosis. Upper chest: There is asymmetric pleural thickening in the right apex with question of pleural effusion in this area. Lung apices otherwise are clear. Other: None IMPRESSION: CT head: Mild atrophy with slight periventricular small vessel disease. No mass or hemorrhage. No extra-axial fluid or acute infarct. Mild vascular calcification noted. CT cervical spine: No fracture. Areas of mild spondylolisthesis at C4-5 and C5-6 are felt to be due to underlying spondylosis. There is multilevel osteoarthritic change. Exit foraminal narrowing due to bony hypertrophy is noted on the left at C5-6 and C6-7. No disc extrusion evident. Asymmetric apical pleural thickening on the right with questionable layering pleural effusion on the right. Electronically Signed   By: Lowella Grip III M.D.   On: 10/29/2017 14:39   Ct Abdomen Pelvis W Contrast  Result Date: 10/29/2017 CLINICAL DATA:  Abdominal pain and distention. EXAM: CT ABDOMEN AND PELVIS WITH CONTRAST TECHNIQUE: Multidetector CT imaging of the abdomen and pelvis was performed using the standard protocol following bolus administration of intravenous contrast. CONTRAST:  <See Chart> ISOVUE-300 IOPAMIDOL (ISOVUE-300) INJECTION 61% COMPARISON:  None. FINDINGS: Lower chest: Patchy airspace disease at the right base is compatible with pneumonia. Small loculated posterior right pleural effusion with tiny free-flowing left pleural effusion. Hepatobiliary: Markedly nodular hepatic contour is compatible with cirrhosis. No focal enhancing mass evident on this exam. Calcified gallstones measure in the 5-10 mm size range. No intrahepatic or extrahepatic biliary dilation. Pancreas: No focal mass lesion. No dilatation of the main duct. No intraparenchymal cyst. No peripancreatic edema. Spleen: Spleen is borderline enlarged at 15 cm  craniocaudal length. Adrenals/Urinary Tract: No adrenal nodule or mass. 7 x 13 x 11 mm nonobstructing stone is identified in the right renal pelvis. Tiny hypoattenuating lesions in the kidneys bilaterally are too small to characterize but likely cysts. No hydronephrosis. No evidence for hydroureter. The urinary bladder appears normal for the degree of distention. Stomach/Bowel: Stomach is nondistended. No gastric wall thickening. No evidence of outlet obstruction. Duodenum is normally positioned as is the ligament of Treitz. Central small bowel loops are distended measuring up to  3.1 cm diameter. Appendix is normal. There is irregular wall thickening in the ascending colon. Transverse colon is gas-filled and distended up to 6.9 cm diameter. Colon is markedly redundant. There is mesenteric edema and pericolonic edema with a small amount of free fluid is seen in the cul-de-sac. Vascular/Lymphatic: No abdominal aortic aneurysm. No abdominal aortic atherosclerotic calcification. Portal vein is prominent but patent. Superior mesenteric vein and splenic vein are patent. Recanalization of the paraumbilical vein is consistent with portal venous hypertension. The celiac axis and SMA are widely patent. There is no gastrohepatic or hepatoduodenal ligament lymphadenopathy. No intraperitoneal or retroperitoneal lymphadenopathy. No pelvic sidewall lymphadenopathy. Reproductive: The uterus has normal CT imaging appearance. 3.1 cm right ovarian cystic lesion evident. No left adnexal mass. Other: Small volume free fluid noted in the cul-de-sac. There is diffuse mesenteric edema. Musculoskeletal: Patient is status post ORIF for left femoral neck fracture. Posttraumatic deformity seen in the left iliac bone. Fractures are identified in the right L4 transverse process and both L5 transverse processes and have acute appearance. There is a fracture of the right sacrum which may be acute to subacute is no bony callus is evident. Sagittal  reformation show chronic compression deformity at T12 and L1. IMPRESSION: 1. Mild small bowel distension with prominent gaseous distention of the transverse colon. No obstructing lesion is identified and there is wall thickening in the ascending colon suggesting infectious/inflammatory colitis. 2. Diffuse mesenteric and pericolonic edema with small volume free fluid in the cul-de-sac. 3. Patchy airspace disease at the right base suggests pneumonia. Tiny bilateral pleural effusions are associated with some probable loculation of the right effusion. 4. Cirrhotic changes in the liver with evidence of portal venous hypertension. 5. Right sacral fracture with fractures of the right L4 and bilateral L5 transverse process ease. These fractures are acute to subacute appearance by CT. 6. Old left iliac bone fracture with chronic compression deformity at T12 and L1. Patient is status post ORIF for left femoral neck fracture. 7. Cholelithiasis 8. 7 x 13 x 11 mm nonobstructing stone in the right renal pelvis. The Electronically Signed   By: Misty Stanley M.D.   On: 10/29/2017 19:26   Dg Chest Port 1 View  Result Date: 10/29/2017 CLINICAL DATA:  Shortness of Breath and bruising EXAM: PORTABLE CHEST 1 VIEW COMPARISON:  October 13, 2017 FINDINGS: There is focal opacity in the right mid lung region. Lungs elsewhere clear. No pneumothorax. Heart size and pulmonary vascularity are normal. No adenopathy. No fracture evident. IMPRESSION: Opacity right mid lung region. Question pneumonia versus potential contusion given the clinical history. Lungs elsewhere clear. Stable cardiac silhouette. No evident pneumothorax. Electronically Signed   By: Lowella Grip III M.D.   On: 10/29/2017 12:46   Dg Abd Portable 1v  Result Date: 10/30/2017 CLINICAL DATA:  Abdominal distension. EXAM: PORTABLE ABDOMEN - 1 VIEW COMPARISON:  CT of 1 day prior FINDINGS: Multiple supine images. These demonstrate mild cardiomegaly. Gaseous distension  of large and small bowel loops which is moderate and similar, when compared to yesterday's scout exam. No pneumatosis. Contrast within the urinary bladder from yesterday's CT. Left proximal femoral fixation. IMPRESSION: Similar mild to moderate gaseous distention of bowel loops, when compared to yesterday's CT exam. Electronically Signed   By: Abigail Miyamoto M.D.   On: 10/30/2017 10:35   Ir Abdomen US Limited  Result Date: 10/29/2017 CLINICAL DATA:  Cirrhosis.  Abdominal distention. EXAM: LIMITED ABDOMEN ULTRASOUND FOR ASCITES TECHNIQUE: Limited ultrasound survey for ascites was performed in all  four abdominal quadrants. COMPARISON:  None. FINDINGS: Four quadrant survey of the abdomen shows insufficient volume of ascites for paracentesis. IMPRESSION: 1. Insufficient volume of ascites for paracentesis. Electronically Signed   By: Kerby Moors M.D.   On: 10/29/2017 16:29        Scheduled Meds: . lactulose  30 g Oral TID  . levothyroxine  50 mcg Oral QAC breakfast  . Magnesium  200 mg Oral Daily  . rifaximin  200 mg Oral TID  . sertraline  100 mg Oral QHS  . traZODone  50 mg Oral QHS   Continuous Infusions: . sodium chloride 100 mL/hr at 10/30/17 0510  . aztreonam 2 g (10/30/17 1232)  . levofloxacin (LEVAQUIN) IV    . vancomycin Stopped (10/30/17 0256)     LOS: 1 day    Time spent: 40 minutes    WOODS, Geraldo Docker, MD [facility] Pager [phone]   If 7PM-7AM, please contact night-coverage [email or web address] Password [email or web address] 10/30/2017, 1:46 PM

## 2017-10-30 NOTE — Progress Notes (Addendum)
Pt very uncomfortable and in pain 10/10. Slightly tremulous, slightly diaphoretic. Requesting something for sleep/anxiety to relax and get some sleep. Paged on-call provider Jeannette Corpus NP. See MAR, will continue to monitor.

## 2017-10-30 NOTE — Consult Note (Signed)
Consultation Note Date: 10/30/2017   Patient Name: Megan Roberson  DOB: October 25, 1956  MRN: [id number]  Age / Sex: 61 y.o., female  PCP: Patient, No Pcp Per Referring Physician: Allie Bossier, MD  Reason for Consultation: Establishing goals of care and Psychosocial/spiritual support  HPI/Patient Profile: 61 y.o. female  with past medical history of NASH as well as alcohol related liver failure/cirrhosis, frequent falls, left hip fracture within the last 6 months, compression fractures, prolonged QT, elevated ammonia, pancytopenia, neuropathy admitted on 10/29/2017 with increased confusion, falls.  Patient recently has moved to [facility] from  [place].  Per patient's husband, patient was on a transplant list in [place].  She has been sober for 6 years.  They have recently attempted to establish with another GI doctor at [facility] and are hopeful to be placed on liver transplant list in [place].  She has been unable to be fully compliant with lactulose and rifaximin treatment because of her inability to reach the bathroom in time, and risk of falling.  Her ammonia level on admission was 108, platelets 41 Imaging obtained after admission shows new acute L4, L5 fracture, compression deformity at T12 and L1.  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  She has been complaining of severe back pain, rated 10 of 10, as well as abd pain  Consult ordered for goals of care  Clinical Assessment and Goals of Care: Met with patient, patient's husband as well as patient's sister and father later joined Dr. Sherral Hammers and myself for goals of care discussion.  Patient is unable to participate in assessment because of encephalopathy; she was able to answer a few simple questions only.  Patient's husband, Megan Roberson, shares that he has seen her this sick before but she is always "bounced back".  She has been in  ICU in the past as well as requiring intubation. Dr. Sherral Hammers reviewed patient's current clinical condition including worsening liver function, new acute back fractures and Ogilvie syndrome We also introduced the topics of CODE STATUS.  Defined again full code versus DNR and spoke in more detail about a comfort based path versus aggressive full scope of treatment path.  Patient at this point is unable to speak for herself secondary to hepatic encephalopathy.  Her healthcare proxy is her husband, Megan Roberson [phone]  Patient was first diagnosed with cirrhosis approximately 8 years ago.  Her husband cites an acute decline since moving to [place] 6 months ago which entailed fracturing her hip as well as her elbow.  She has 1 living daughter who resides in [place].  She has 1 daughter who died in her 21s from liver failure related to alcoholism    SUMMARY OF RECOMMENDATIONS   Continue full code.  Full code, DNR terms defined as well as discussed in terms of risks and benefits Palliative medicine to stay involved to be an additional resource and source of support as we see to what degree patient can improve.  She is in significant pain but at this  point opioids are contraindicated Recommend resuming gabapentin when safe for patient to take oral medications.  Patient was on this medication at a dosage of 1200 mg a day prior to hospitalization (this is per her husband) Code Status/Advance Care Planning:  Full code    Symptom Management:   Pain: Continue with attendings recommendations such as Toradol.  Recommend when patient is able to take medicines by mouth resuming gabapentin at 300 mg 3 times a day  Palliative Prophylaxis:   Aspiration, Bowel Regimen, Delirium Protocol, Eye Care, Frequent Pain Assessment, Oral Care and Turn Reposition  Additional Recommendations (Limitations, Scope, Preferences):  Full Scope Treatment  Psycho-social/Spiritual:   Desire for further  Chaplaincy support:no  Additional Recommendations: Grief/Bereavement Support  Prognosis:   Unable to determine  Discharge Planning: To Be Determined      Primary Diagnoses: Present on Admission: . Encephalopathy   I have reviewed the medical record, interviewed the patient and family, and examined the patient. The following aspects are pertinent.  Past Medical History:  Diagnosis Date  . Anxiety   . Cirrhosis of liver ([place])   . Depression   . Heart murmur    "not concerned about "   . History of abdominal paracentesis   . History of blood transfusion   . Hypothyroidism   . Liver disease 2008  . Neuropathy   . Pancytopenia ([place])    Social History   Socioeconomic History  . Marital status: Married    Spouse name: None  . Number of children: None  . Years of education: None  . Highest education level: None  Social Needs  . Financial resource strain: None  . Food insecurity - worry: None  . Food insecurity - inability: None  . Transportation needs - medical: None  . Transportation needs - non-medical: None  Occupational History  . None  Tobacco Use  . Smoking status: Never Smoker  . Smokeless tobacco: Never Used  Substance and Sexual Activity  . Alcohol use: No  . Drug use: No  . Sexual activity: Not Currently    Birth control/protection: Post-menopausal  Other Topics Concern  . None  Social History Narrative  . None   Family History  Adopted: Yes   Scheduled Meds: . dexamethasone  4 mg Intravenous Q6H  . gabapentin  600 mg Oral TID  . insulin aspart  0-15 Units Subcutaneous Q4H  . ketorolac  30 mg Intravenous BID  . lactulose  300 mL Rectal QID  . levothyroxine  50 mcg Oral QAC breakfast  . Magnesium  200 mg Oral Daily  . rifaximin  200 mg Oral TID  . sertraline  100 mg Oral QHS  . traZODone  50 mg Oral QHS   Continuous Infusions: . aztreonam Stopped (10/30/17 1302)  . dextrose 5 %-0.9% nacl with kcl 100 mL/hr at 10/30/17 1538  .  levofloxacin (LEVAQUIN) IV 750 mg (10/30/17 1459)  . vancomycin Stopped (10/30/17 1449)   PRN Meds:.guaiFENesin-dextromethorphan, ibuprofen, ondansetron (ZOFRAN) IV Medications Prior to Admission:  Prior to Admission medications   Medication Sig Start Date End Date Taking? Authorizing Provider  Ferrous Sulfate (IRON) 325 (65 Fe) MG TABS Take 325 mg by mouth 3 (three) times daily. 09/01/17  Yes [provider]  gabapentin (NEURONTIN) 600 MG tablet Take 600 mg by mouth 3 (three) times daily.    Yes [provider]  levothyroxine (SYNTHROID, LEVOTHROID) 50 MCG tablet Take 50 mcg daily before breakfast by mouth.    Yes [provider]  Magnesium 250 MG TABS Take 250 mg by mouth daily.   Yes [provider]  ondansetron (ZOFRAN ODT) 4 MG disintegrating tablet Take 1 tablet (4 mg total) every 8 (eight) hours as needed by mouth for nausea or vomiting. 09/17/17  Yes Ainsley Spinner, PA-C  oxyCODONE (ROXICODONE) 5 MG immediate release tablet Take 1-2 tablets (5-10 mg total) by mouth every 8 (eight) hours as needed for severe pain. 10/15/17  Yes Isaac Bliss, Rayford Halsted, MD  potassium chloride (K-DUR,KLOR-CON) 10 MEQ tablet Take 10 mEq 2 (two) times daily by mouth.   Yes [provider]  rifaximin (XIFAXAN) 200 MG tablet Take 200 mg by mouth 3 (three) times daily.    Yes [provider]  sertraline (ZOLOFT) 100 MG tablet Take 100 mg at bedtime by mouth.   Yes [provider]  traZODone (DESYREL) 50 MG tablet Take 50 mg at bedtime by mouth.   Yes [provider]  lactulose (CHRONULAC) 10 GM/15ML solution Take 20 g by mouth 3 (three) times daily. 10/06/17   [provider]   Allergies  Allergen Reactions  . Penicillins Swelling    FACIAL SWELLING  PATIENT HAS HAD A PCN REACTION WITH IMMEDIATE RASH, FACIAL/TONGUE/THROAT SWELLING, SOB, OR LIGHTHEADEDNESS WITH HYPOTENSION:  #  #  #  YES  #  #  #   Has patient had a PCN reaction  causing severe rash involving mucus membranes or skin necrosis: No Has patient had a PCN reaction that required hospitalization: No Has patient had a PCN reaction occurring within the last 10 years: No If all of the above answers are "NO", then may proceed with Cephalosporin u  . Sulfa Antibiotics Itching and Rash   Review of Systems  Unable to perform ROS: Acuity of condition    Physical Exam  Constitutional:  Acutely ill-appearing older female; multiple bruising from head to toe Encephalopathic  HENT:  Head: Normocephalic and atraumatic.  Neck: Normal range of motion.  Large area of bruising noted to the neck under jawline  Cardiovascular: Normal rate.  Pulmonary/Chest: Effort normal.  Abdominal: She exhibits distension. There is tenderness.  Genitourinary:  Genitourinary Comments: Foley catheter  Musculoskeletal:  Verbalizing severe back pain as well as abdominal pain She is only able to lay flat because of back injury  Skin: Skin is warm and dry.  Multiple areas of ecchymoses from head to toe  Psychiatric:  Anxious tearful secondary to pain and medical condition  Nursing note and vitals reviewed.   Vital Signs: BP (!) 156/87   Pulse 89   Temp 98.8 F (37.1 C) (Oral)   Resp 15   Ht 5' 7"  (1.702 m)   Wt 65.8 kg (145 lb)   SpO2 94%   BMI 22.71 kg/m  Pain Assessment: CPOT   Pain Score: 10-Worst pain ever   SpO2: SpO2: 94 % O2 Device:SpO2: 94 % O2 Flow Rate: .O2 Flow Rate (L/min): 2 L/min  IO: Intake/output summary:   Intake/Output Summary (Last 24 hours) at 10/30/2017 1556 Last data filed at 10/30/2017 1538 Gross per 24 hour  Intake 3475.01 ml  Output 0 ml  Net 3475.01 ml    LBM: Last BM Date: 10/27/17 Baseline Weight: Weight: 65.8 kg (145 lb) Most recent weight: Weight: 65.8 kg (145 lb)     Palliative Assessment/Data:   Flowsheet Rows     Most Recent Value  Intake Tab  Referral Department  Hospitalist  Unit at Time of Referral  Intermediate  Care Unit  Palliative Care Primary Diagnosis  Other (Comment)  Date Notified  10/29/17  Palliative Care Type  New Palliative care  Reason for referral  Clarify Goals of Care  Date of Admission  10/29/17  Date first seen by Palliative Care  10/30/17  # of days Palliative referral response time  1 Day(s)  # of days IP prior to Palliative referral  0  Clinical Assessment  Palliative Performance Scale Score  30%  Pain Max last 24 hours  Not able to report  Pain Min Last 24 hours  Not able to report  Dyspnea Max Last 24 Hours  Not able to report  Dyspnea Min Last 24 hours  Not able to report  Nausea Max Last 24 Hours  Not able to report  Nausea Min Last 24 Hours  Not able to report  Anxiety Max Last 24 Hours  Not able to report  Anxiety Min Last 24 Hours  Not able to report  Other Max Last 24 Hours  Not able to report  Psychosocial & Spiritual Assessment  Palliative Care Outcomes  Patient/Family meeting held?  Yes  Who was at the meeting?  pt's husband, father and sister  Palliative Care follow-up planned  Yes, Facility      Time In: 8921 Time Out: 1300 Time Total: 75 min Greater than 50%  of this time was spent counseling and coordinating care related to the above assessment and plan. Staffed with Dr. Sherral Hammers and bedside RN  Signed by: Dory Horn, NP   Please contact Palliative Medicine Team phone at [phone] for questions and concerns.  For individual provider: See Shea Evans

## 2017-10-30 NOTE — Progress Notes (Signed)
Pt has been in uncontrollable excruciating pain and anxiety all during this shift, tearful, and crying with minimal relief from PO pain meds. RN has paged on-call provider ([place], NP) multiple times throughout the shift regarding pts pain. Provider gave orders for RN to give PO pain meds early with very minimal relief. Will continue to monitor. Clint Bolder, RN 10/30/17 5:52 AM

## 2017-10-30 NOTE — Progress Notes (Addendum)
Sherral Hammers, MD paged regarding low K and needs for diet order.  1155: MD Danis at bedside, advised pt diet to be NPO sips with meds for possible scope during admission. MD Sherral Hammers made aware via text page.    1822: MRI informed RN that patient refused to be moved to exam table, MD made aware via text page.

## 2017-10-31 ENCOUNTER — Inpatient Hospital Stay (HOSPITAL_COMMUNITY): Payer: Medicare Other

## 2017-10-31 DIAGNOSIS — D696 Thrombocytopenia, unspecified: Secondary | ICD-10-CM

## 2017-10-31 DIAGNOSIS — D689 Coagulation defect, unspecified: Secondary | ICD-10-CM

## 2017-10-31 DIAGNOSIS — K598 Other specified functional intestinal disorders: Secondary | ICD-10-CM

## 2017-10-31 DIAGNOSIS — S3210XA Unspecified fracture of sacrum, initial encounter for closed fracture: Secondary | ICD-10-CM

## 2017-10-31 DIAGNOSIS — K56600 Partial intestinal obstruction, unspecified as to cause: Secondary | ICD-10-CM

## 2017-10-31 DIAGNOSIS — K7210 Chronic hepatic failure without coma: Secondary | ICD-10-CM

## 2017-10-31 DIAGNOSIS — G9340 Encephalopathy, unspecified: Secondary | ICD-10-CM

## 2017-10-31 LAB — CBC
HCT: 28.6 % — ABNORMAL LOW (ref 36.0–46.0)
Hemoglobin: 8.8 g/dL — ABNORMAL LOW (ref 12.0–15.0)
MCH: 30.4 pg (ref 26.0–34.0)
MCHC: 30.8 g/dL (ref 30.0–36.0)
MCV: 99 fL (ref 78.0–100.0)
Platelets: 35 10*3/uL — ABNORMAL LOW (ref 150–400)
RBC: 2.89 MIL/uL — ABNORMAL LOW (ref 3.87–5.11)
RDW: 20.8 % — ABNORMAL HIGH (ref 11.5–15.5)
WBC: 5.3 10*3/uL (ref 4.0–10.5)

## 2017-10-31 LAB — BASIC METABOLIC PANEL
Anion gap: 6 (ref 5–15)
BUN: 10 mg/dL (ref 6–20)
CO2: 24 mmol/L (ref 22–32)
Calcium: 7.5 mg/dL — ABNORMAL LOW (ref 8.9–10.3)
Chloride: 105 mmol/L (ref 101–111)
Creatinine, Ser: 0.6 mg/dL (ref 0.44–1.00)
GFR calc Af Amer: >60 mL/min (ref >60)
GFR calc non Af Amer: >60 mL/min (ref >60)
Glucose, Bld: 159 mg/dL — ABNORMAL HIGH (ref 65–99)
Potassium: 3.9 mmol/L (ref 3.5–5.1)
Sodium: 135 mmol/L (ref 135–145)

## 2017-10-31 LAB — AMMONIA: Ammonia: 46 umol/L — ABNORMAL HIGH (ref 9–35)

## 2017-10-31 LAB — MAGNESIUM: Magnesium: 2 mg/dL (ref 1.7–2.4)

## 2017-10-31 LAB — GLUCOSE, CAPILLARY
Glucose-Capillary: 119 mg/dL — ABNORMAL HIGH (ref 65–99)
Glucose-Capillary: 157 mg/dL — ABNORMAL HIGH (ref 65–99)
Glucose-Capillary: 146 mg/dL — ABNORMAL HIGH (ref 65–99)
Glucose-Capillary: 118 mg/dL — ABNORMAL HIGH (ref 65–99)
Glucose-Capillary: 122 mg/dL — ABNORMAL HIGH (ref 65–99)
Glucose-Capillary: 96 mg/dL (ref 65–99)

## 2017-10-31 MED ORDER — FLUMAZENIL 0.5 MG/5ML IV SOLN
0.2000 mg | INTRAVENOUS | Status: AC
  Filled 2017-10-31: qty 5

## 2017-10-31 MED ORDER — FLUMAZENIL 1 MG/10ML IV SOLN
INTRAVENOUS | Status: AC
  Administered 2017-10-31: 0.2 mg
  Filled 2017-10-31: qty 10

## 2017-10-31 MED ORDER — NALOXONE HCL 0.4 MG/ML IJ SOLN
INTRAMUSCULAR | Status: AC
  Filled 2017-10-31: qty 1

## 2017-10-31 NOTE — Progress Notes (Addendum)
Daily Rounding Note  10/31/2017, 10:13 AM  LOS: 2 days   SUBJECTIVE:   Chief complaint:     RR team called 7 AM today for difficulty arousing, responding only to sternal rub.  Had received Ativan and Morphine pre MRI last night.  MS improved after Romazicon.  Ativan now discontinued.    OBJECTIVE:         Vital signs in last 24 hours:    Temp:  [97.6 F (36.4 C)-98.8 F (37.1 C)] 97.6 F (36.4 C) (12/30 0753) Pulse Rate:  [62-97] 85 (12/30 0025) Resp:  [14-18] 18 (12/30 0640) BP: (113-156)/(73-103) 113/76 (12/30 0753) SpO2:  [92 %-98 %] 97 % (12/30 0640) Last BM Date: 10/30/17 Filed Weights   10/29/17 1107  Weight: 65.8 kg (145 lb)   General: Obtunded.  Did not respond to moderate shaking.  Did not attempt sternal rub or aggressive measures to wake her up.  Grunting occasionally. Heart: RRR. Chest: No labored breathing Abdomen: Protuberant, somewhat improved from 2 days ago.  Soft.  Scant, distant tinkling/tympanitic bowel sounds. Rectal: flexiseal in place.  It contains urine like clear watery liquid. Extremities: No CCE.  Torso and limbs covered with bruising and purpura. Neuro/Psych: Obtunded.  Did not attempt aggressive measures to wake her up.  No involuntary movements or tremors  Intake/Output from previous day: 12/29 0701 - 12/30 0700 In: 3131.7 [P.O.:240; I.V.:2241.7; IV Piggyback:650] Out: 400 [Urine:400]  Intake/Output this shift: Total I/O In: -  Out: 475 [Urine:175; Stool:300]  Lab Results: Recent Labs    10/29/17 1215 10/30/17 0831 10/31/17 0522  WBC 11.4* 9.7 5.3  HGB 10.8* 9.4* 8.8*  HCT 34.3* 31.1* 28.6*  PLT 51* 41* 35*   BMET Recent Labs    10/29/17 1215 10/30/17 0831 10/30/17 1834 10/31/17 0522  NA 139 139  --  135  K 3.8 2.9* 3.2* 3.9  CL 100* 105  --  105  CO2 26 27  --  24  GLUCOSE 65 75  --  159*  BUN 5* 7  --  10  CREATININE 0.79 0.73  --  0.60  CALCIUM 8.5* 7.9*   --  7.5*   LFT Recent Labs    10/29/17 1215 10/30/17 0831  PROT 6.0* 5.3*  ALBUMIN 2.7* 2.4*  AST 156* 127*  ALT 97* 79*  ALKPHOS 313* 274*  BILITOT 4.1* 3.8*   PT/INR Recent Labs    10/29/17 1215 10/30/17 0831  LABPROT 17.0* 18.7*  INR 1.39 1.58   Hepatitis Panel No results for input(s): HEPBSAG, HCVAB, HEPAIGM, HEPBIGM in the last 72 hours.  Studies/Results: Ct Head Wo Contrast Ct Cervical Spine Wo Contrast  Result Date: 10/29/2017 CLINICAL DATA:  Confusion and weakness.  Unwitnessed fall EXAM: CT HEAD WITHOUT CONTRAST CT CERVICAL SPINE WITHOUT CONTRAST TECHNIQUE: Multidetector CT imaging of the head and cervical spine was performed following the standard protocol without intravenous contrast. Multiplanar CT image reconstructions of the cervical spine were also generated. COMPARISON:  Head CT October 13, 2017 FINDINGS: CT HEAD FINDINGS Brain: There is mild diffuse atrophy. There is no intracranial mass, hemorrhage, extra-axial fluid collection, or midline shift. There is slight small vessel disease in the centra semiovale bilaterally. Elsewhere gray-white compartments are normal. No evident acute infarct. Vascular: No hyperdense vessel. There is minimal calcification in each carotid siphon. Skull: The bony calvarium appears intact. Sinuses/Orbits: Visualized paranasal sinuses are clear. Orbits appear symmetric bilaterally. Other: Mastoid air cells clear. CT CERVICAL SPINE  FINDINGS Alignment: There is 2 mm of anterolisthesis of C4 on C5. There is 1 mm of retrolisthesis of C5 on C6. No other spondylolisthesis evident. Skull base and vertebrae: Skull base and craniocervical junction regions appear normal. No evident fracture. There are no blastic or lytic bone lesions. Soft tissues and spinal canal: Prevertebral soft tissues appear within normal limits. Predental space region is normal. No paraspinous lesion. No cord or canal hematoma evident. Disc levels: There is severe disc space  narrowing at C4-5, C5-6, and C6-7. There are anterior osteophytes at C4, C5, and C6. There is multilevel facet osteoarthritic change. There is exit foraminal narrowing due to bony hypertrophy at C5-6 and C6-7 on the left with impression on the exiting nerve roots at these levels. There is no frank disc extrusion or stenosis. Upper chest: There is asymmetric pleural thickening in the right apex with question of pleural effusion in this area. Lung apices otherwise are clear. Other: None IMPRESSION: CT head: Mild atrophy with slight periventricular small vessel disease. No mass or hemorrhage. No extra-axial fluid or acute infarct. Mild vascular calcification noted. CT cervical spine: No fracture. Areas of mild spondylolisthesis at C4-5 and C5-6 are felt to be due to underlying spondylosis. There is multilevel osteoarthritic change. Exit foraminal narrowing due to bony hypertrophy is noted on the left at C5-6 and C6-7. No disc extrusion evident. Asymmetric apical pleural thickening on the right with questionable layering pleural effusion on the right. Electronically Signed   By: Lowella Grip III M.D.   On: 10/29/2017 14:39   Mr Lumbar Spine Wo Contrast  Result Date: 10/31/2017 CLINICAL DATA:  61 y/o F; sacral fracture and acute urinary retention. EXAM: MRI LUMBAR SPINE WITHOUT CONTRAST TECHNIQUE: Multiplanar, multisequence MR imaging of the lumbar spine was performed. No intravenous contrast was administered. COMPARISON:  10/29/2017 CT abdomen and pelvis. FINDINGS: Segmentation:  Standard. Alignment:  Exaggerated lumbar lordosis without listhesis. Vertebrae: Stable chronic anterior compression deformities of T12 and L1 vertebral bodies. Acute complex sacral fracture with impaction and mild displacement of the S2 segment and fracture lines extending into bilateral sacral ala. Bilateral L5 transverse process fractures with edema. Conus medullaris and cauda equina: Conus extends to the L1-2 level. Fracture lines  extend to the bilateral S1 and S2 neural foramen, however, fat planes surrounding the nerves is intact and the foramen are patent. Paraspinal and other soft tissues: Negative. Disc levels: L1-2: No significant disc displacement, foraminal stenosis, or canal stenosis. L2-3: No significant disc displacement, foraminal stenosis, or canal stenosis. L3-4: Small disc bulge with mild facet hypertrophy. No significant foraminal or canal stenosis. L4-5: Small disc bulge and mild facet hypertrophy. No significant foraminal or canal stenosis. L5-S1: Small disc bulge and mild facet hypertrophy. No significant foraminal or canal stenosis. IMPRESSION: 1. Acute complex sacral fracture with impaction and posterior displacement of S2 segment. Posterior displaced S2 body narrows the sacral canal, however, it is patent. Possible mass effect on descending cauda equina at S2 from the narrow canal and acute edema. 2. Fracture lines extend S1 and S2 neural foramen. The neural foramen are patent. 3. Mild lumbar spondylosis. No significant foraminal or canal stenosis. Electronically Signed   By: Kristine Garbe M.D.   On: 10/31/2017 04:29   Ct Abdomen Pelvis W Contrast  Result Date: 10/29/2017 CLINICAL DATA:  Abdominal pain and distention. EXAM: CT ABDOMEN AND PELVIS WITH CONTRAST TECHNIQUE: Multidetector CT imaging of the abdomen and pelvis was performed using the standard protocol following bolus administration of intravenous contrast.  CONTRAST:  <See Chart> ISOVUE-300 IOPAMIDOL (ISOVUE-300) INJECTION 61% COMPARISON:  None. FINDINGS: Lower chest: Patchy airspace disease at the right base is compatible with pneumonia. Small loculated posterior right pleural effusion with tiny free-flowing left pleural effusion. Hepatobiliary: Markedly nodular hepatic contour is compatible with cirrhosis. No focal enhancing mass evident on this exam. Calcified gallstones measure in the 5-10 mm size range. No intrahepatic or extrahepatic  biliary dilation. Pancreas: No focal mass lesion. No dilatation of the main duct. No intraparenchymal cyst. No peripancreatic edema. Spleen: Spleen is borderline enlarged at 15 cm craniocaudal length. Adrenals/Urinary Tract: No adrenal nodule or mass. 7 x 13 x 11 mm nonobstructing stone is identified in the right renal pelvis. Tiny hypoattenuating lesions in the kidneys bilaterally are too small to characterize but likely cysts. No hydronephrosis. No evidence for hydroureter. The urinary bladder appears normal for the degree of distention. Stomach/Bowel: Stomach is nondistended. No gastric wall thickening. No evidence of outlet obstruction. Duodenum is normally positioned as is the ligament of Treitz. Central small bowel loops are distended measuring up to 3.1 cm diameter. Appendix is normal. There is irregular wall thickening in the ascending colon. Transverse colon is gas-filled and distended up to 6.9 cm diameter. Colon is markedly redundant. There is mesenteric edema and pericolonic edema with a small amount of free fluid is seen in the cul-de-sac. Vascular/Lymphatic: No abdominal aortic aneurysm. No abdominal aortic atherosclerotic calcification. Portal vein is prominent but patent. Superior mesenteric vein and splenic vein are patent. Recanalization of the paraumbilical vein is consistent with portal venous hypertension. The celiac axis and SMA are widely patent. There is no gastrohepatic or hepatoduodenal ligament lymphadenopathy. No intraperitoneal or retroperitoneal lymphadenopathy. No pelvic sidewall lymphadenopathy. Reproductive: The uterus has normal CT imaging appearance. 3.1 cm right ovarian cystic lesion evident. No left adnexal mass. Other: Small volume free fluid noted in the cul-de-sac. There is diffuse mesenteric edema. Musculoskeletal: Patient is status post ORIF for left femoral neck fracture. Posttraumatic deformity seen in the left iliac bone. Fractures are identified in the right L4  transverse process and both L5 transverse processes and have acute appearance. There is a fracture of the right sacrum which may be acute to subacute is no bony callus is evident. Sagittal reformation show chronic compression deformity at T12 and L1. IMPRESSION: 1. Mild small bowel distension with prominent gaseous distention of the transverse colon. No obstructing lesion is identified and there is wall thickening in the ascending colon suggesting infectious/inflammatory colitis. 2. Diffuse mesenteric and pericolonic edema with small volume free fluid in the cul-de-sac. 3. Patchy airspace disease at the right base suggests pneumonia. Tiny bilateral pleural effusions are associated with some probable loculation of the right effusion. 4. Cirrhotic changes in the liver with evidence of portal venous hypertension. 5. Right sacral fracture with fractures of the right L4 and bilateral L5 transverse process ease. These fractures are acute to subacute appearance by CT. 6. Old left iliac bone fracture with chronic compression deformity at T12 and L1. Patient is status post ORIF for left femoral neck fracture. 7. Cholelithiasis 8. 7 x 13 x 11 mm nonobstructing stone in the right renal pelvis. The Electronically Signed   By: Misty Stanley M.D.   On: 10/29/2017 19:26   Dg Chest Port 1 View  Result Date: 10/29/2017 CLINICAL DATA:  Shortness of Breath and bruising EXAM: PORTABLE CHEST 1 VIEW COMPARISON:  October 13, 2017 FINDINGS: There is focal opacity in the right mid lung region. Lungs elsewhere clear. No pneumothorax. Heart  size and pulmonary vascularity are normal. No adenopathy. No fracture evident. IMPRESSION: Opacity right mid lung region. Question pneumonia versus potential contusion given the clinical history. Lungs elsewhere clear. Stable cardiac silhouette. No evident pneumothorax. Electronically Signed   By: Lowella Grip III M.D.   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 Evaluate for perforation. EXAM: PORTABLE ABDOMEN - 1 VIEW COMPARISON:  10/30/2017 FINDINGS: Gaseous distension of large and small bowel loops which is moderate and similar when compared to yesterday's film. The colon measures up to 9 cm in diameter. IMPRESSION: 1. Similar gaseous distension of large and small bowel loops compared to yesterday's exam. Electronically Signed   By: Kerby Moors M.D.   On: 10/31/2017 08:27   Ir Abdomen US Limited  Result Date: 10/29/2017 CLINICAL DATA:  Cirrhosis.  Abdominal distention. EXAM: LIMITED ABDOMEN ULTRASOUND FOR ASCITES TECHNIQUE: Limited ultrasound survey for ascites was performed in all four abdominal quadrants. COMPARISON:  None. FINDINGS: Four quadrant survey of the abdomen shows insufficient volume of ascites for paracentesis. IMPRESSION: 1. Insufficient volume of ascites for paracentesis. Electronically Signed   By: Kerby Moors M.D.   On: 10/29/2017 16:29   Scheduled Meds: . dexamethasone  4 mg Intravenous Q6H  . gabapentin  600 mg Oral TID  . insulin aspart  0-15 Units Subcutaneous Q4H  . ketorolac  30 mg Intravenous BID  . lactulose  300 mL Rectal QID  . levothyroxine  50 mcg Oral QAC breakfast  . Magnesium  200 mg Oral Daily  . rifaximin  200 mg Oral TID  . sertraline  100 mg Oral QHS  . traZODone  50 mg Oral QHS   Continuous Infusions: . aztreonam 2 g (10/31/17 0545)  . dextrose 5 %-0.9% nacl with kcl 100 mL/hr at 10/31/17 1011  . levofloxacin (LEVAQUIN) IV Stopped (10/30/17 1629)  . vancomycin Stopped (10/31/17 0545)   PRN Meds:.guaiFENesin-dextromethorphan, ibuprofen, ondansetron (ZOFRAN) IV   ASSESMENT:   *  Ileus in pt on narcotics, inactive/mostly bed bound.  Insignificant, TSTT ascites on ultrasound.   Mesenteric and peri-colonic edema per CT  *  AMS, multifactorial.  On Rifaxamin and rectal lactulose for HE  *  Hypokalemia. Resolved.    *   Decompensated cirrhosis of liver.  Selectively compliant with meds at home.    *  Anemia.  Hgb down 0.5 gm in 24 hours, down 3 gm in 3 days.    *  Thrombocytopenia with associated extensive purpura  *  Multifocal pain from numerous fall related fractures.  Toradol on board.   *  Coagulopathy.  Mild.  Elevated PT, normal INR.      *  Right lung opacity.  ? PNA vs contusion?  A febrile.  Vanc, Levaquin, Aztreonam on board.  Blood clx pending, no growth so far.  Leukocytosis resolved 11.4 >> 5.3  *  Microscopic hematuria.     PLAN   *    Supportive care.  No adjustments made to meds.    *    Pall care involved.  For now pt full code.      Megan Roberson  10/31/2017, 10:13 AM Pager: [phone]  I have discussed the case with the PA, and that is the plan I formulated. I personally interviewed and examined the patient.  This continues to be a very difficult situation with worsened mental status at least partially related to sedation she received in order  to undergo an MRI. 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 Her abdomen remains softly distended, a bit less so than upon admission, but with high-pitched and tinkling bowel sounds as before. There has not been much result from the lactulose enemas. We have opted not to give her oral lactulose out of concern that it would increase the abdominal distention. She is therefore on rifaximin, but at this point is not taking anything by mouth since she has been stuporous all day.  I spent a long time with her husband today and offered him support feels overwhelmed and concerned about the prospect that his wife may not survive this episode. He appears overwhelmed with [place] anger. Her prognosis seems to have worsened since admission. Neurosurgery saw  her and have no surgical solutions to her multiple spinal fractures. Opioid pain medications have led to the pseudoobstruction and probably exacerbation of hepatic encephalopathy.  Her prognosis is poor and I think it seems increasingly likely that she may not survive long. Palliative care has been in to see them, and I suspect they are likely to offer hospice services soon.  I'm afraid there is nothing else our service can provide for her, so we will sign off and you may call us if the need arises. Dr. Hilarie Fredrickson will be managing the consult service this week.  Total time 30 minutes, over half spent in discussion with her husband.  Nelida Meuse III Pager [phone]  Mon-Fri 8a-5p [phone] after 5p, weekends, holidays

## 2017-10-31 NOTE — Significant Event (Signed)
Rapid Response Event Note  Overview: Time Called: 0602 Arrival Time: 0605 Event Type: Neurologic  Initial Focused Assessment: Called by charge RN.  Per nursing staff patient is hard to arouse and only repsonds to painful stimuli.  Last night patient went to get a MRI, was given 34m Morphine IV and 471mAtivan IV and went for the scan. At 0600, patient was hard to arouse, when I arrived, patient only respond to sternal rub but was alert or following commands, pupils 41m42mbrisk, reactive, + CR.  RR 8-13, sats maintaining on 2L greater than 97%, lung sounds diminshed but good air movement bilaterally, abdomen distended, + pulses, HR in the 60s, BP stable.  Interventions: -- TRH NP and spoke with her. Since pain management has been a challenge for this patient, decision was made to try to Ativan first.  [facility] administered a total of 0.2mg37mumazenil (0.1mg 34m0625 and 0.1mg a44m630).  A few minutes after the second dose, patient was awake, able to follow all commands, able to sip water. Patient was repositioned in the bed, maintaining HOB > 30.   Plan of Care (if not transferred): -- [facility] updated her on that status of that patient and requested the ATIVAN be discontinued. -- I advised/educated nursing staff on the importance of using their clinical judgement when administering IV pain and sedation, especially together. I educated them on the importance of balancing pain medications and comfort with maintaining hemodynamics and stability.  -- I called back at 730, patient was still awake and alert  Event Summary: [facility] at 0610  [phone]: Stayed in room and stabalized  Event [place] Time: 0645 [place]

## 2017-10-31 NOTE — Progress Notes (Signed)
Received report that patient was hard to arouse. Upon assessment, patient was still very hard to arouse. Tried speaking patient's name and a sternal rub without success. Pinched patient's finger nail beds and Patient withdrew from pain. Pinched patient's toenail beds without signs of arousal. MD notified. Received no new orders. Will continue to monitor.

## 2017-10-31 NOTE — Progress Notes (Signed)
PROGRESS NOTE    Megan Roberson  EML:[id number] DOB: [birth year] DOA: 10/29/2017 PCP: Patient, No Pcp Per   Brief Narrative:  Megan Roberson is a 61 y.o.WF  PMHx Anxiety, Depression,Heart murmur , Hypothyroidism, Pancytopenia, neuropathy, Cirhosis of liver.   Patient discharged on 10/14/2017 Brought in today by her husband who reports pt has increased confusion and frequent falls.  Husband was recently away.  He reports pt has had multiple falls while he has been gone.  He reports pt is very unsteady.  Pt has had increasing confusion.  He also reports she has had increased abdominal swelling.  He reports pt has bruises all over   Pt reports she used to drink a lot.  She reports she stopped drinking 6 years ago.   Pt reports she was on liver transplant list in [place] until she moved here.         ED Course:  Pt seen in the ED and had labs.  Pt has an ammonia level of 104, lactic acid over 4.  INR is 1.30.  Pt was noted to have hit her head recently when she fell. Ct in ED showed no acute abnormality. Ultrasound guided paracentesis has been ordered.    Subjective: 12/30 Sleepy, minimal response to painful stimuli.  Patient did receive Ativan last night for MRI procedure.    Assessment & Plan:   Active Problems:   Encephalopathy   Abdominal distension   Abdominal pain   Increased ammonia level   Liver failure without hepatic coma (HCC)   Thrombocytopenia (HCC)   Palliative care by specialist   Elevated lactic acid level   Acute hepatic encephalopathy   Closed fracture of lumbar spine without lesion of spinal cord (HCC)  Lactic acidosis -Normalized  Ogilvie Syndrome -Correct electrolytes aggressively -12/30 KUB: Similar to previous exam: Extension 9 cm see results below - KUB on 12/31.  Require q 24 hour monitoring. -Per GI not candidate for decompression at this time.  Sacral Fracture -L-spine MRI: Review shows some mass-effect of the cauda equina.  Discussed case with  Dr. Newman Pies Neurosurgery. - Concurs no surgical options available for correction of deficit.  Given patient's multiple medical problems not a surgical candidate -Dr. Arnoldo Morale has requested [employer] attempt to fit patient with TLSO brace (will be difficult given her abdominal distention)  Ascites -Per EMR last paracentesis 1 week ago 5 L fluid removed -Patient evaluated by IR no ascites therefore no paracentesis performed.   Sepsis pneumonia?/Loculated RIGHT pleural effusion -Patient does not meet guidelines for sepsis at time of admission -Patient does have lactic acidosis, which could be secondary to her multiple falls, dehydration. -Pharmacy consulted on admission for antibiotics -Effusion small given patient's multiple medical problems not a candidate for attempted thoracentesis. -Negative fever, negative leukocytosis, negative left shift, negative bands negative fever, continue antibiotics for another 24 hours and discontinue if no additional signs or symptoms of infection.  SBP -Possible, but unlikely.   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  Increased Ammonia level/hepatic encephalopathy -Lactulose enema QID (on hold) Recent Labs  Lab 10/29/17 1244 10/30/17 1414 10/31/17 0522  AMMONIA 104* 45* 46*  -Avoid PO lactulose: Will increase patient's abdominal distention.  Thrombocytopenia (baseline 30-50 K) -At baseline -INR= 1.58  Hypokalemia -D5-Normal saline +Potassium IV 60 mEq @ 134m/hr  Hypocalcemia -Corrected calcium= 8.9  Acute/Subacute L-spine fracture -L-spine MRI: Multiple acute/subacute fractures.  See results below -Discussed case with neurosurgery agree can discontinue Decadron   Pain  control -Continue Toradol -Continue Gabapentin   DVT prophylaxis: SCD Code Status: Full Family Communication: Spoke at length with husband, on findings.  Extremely distraught.   Disposition Plan:  TBD   Consultants:  GI IR   Procedures/Significant Events:  CT C-spine/CT head WO contrast: - Negative masses/hemorrhage - Areas of mild spondylolisthesis at C4-5 and C5-6 are felt to be due to underlying spondylosis. - multilevel osteoarthritic change. Exit foraminal narrowing due to bony hypertrophy is noted on the left at C5-6 and C6-7. No disc extrusion evident. 12/28 CT abdomen pelvis W contrast:-Mild small bowel distension with prominent gaseous distention of the transverse colon. No obstructing lesion is identified and there is wall thickening in the ascending colon suggesting infectious/inflammatory colitis. - Diffuse mesenteric and pericolonic edema with small volume free fluid in the cul-de-sac. -Patchy airspace disease at the right base suggests pneumonia. Tiny bilateral pleural effusions are associated with some probable loculation of the right effusion. - Cirrhotic changes in the liver with evidence of portal venous hypertension. -Right sacral fracture with fractures of the right L4 and bilateral L5 transverse process ease. These fractures are acute to subacute appearance by CT. - Old left iliac bone fracture with chronic compression deformity at T12 and L1. Patient is status post ORIF for left femoral neck fracture. -Cholelithiasis-8. 7 x 13 x 11 mm nonobstructing stone in the right renal pelvis. 12/29 MRI L-spine-Acute complex sacral fracture with impaction and posterior displacement of S2 segment. Posterior displaced S2 body narrows the sacral canal, however, it is patent. -Possible mass effect on descending cauda equina at S2 from the narrow canal and acute edema. - Fracture lines extend S1 and S2 neural foramen. The neural foramen are patent. - Mild lumbar spondylosis. No significant foraminal or canal stenosis. 12/30 KUB:-Gaseous distension of large and small bowel loops which is moderate and similar when compared to yesterday's film.-Colon measures up to 9 cm in  diameter.    I have personally reviewed and interpreted all radiology studies and my findings are as above.  VENTILATOR SETTINGS:    Cultures -12/28 blood NGTD -12/28 MRSA by PCR negative -12/29 urine pending   Antimicrobials: Anti-infectives (From admission, onward)   Start     Stop   10/30/17 1400  levofloxacin (LEVAQUIN) IVPB 750 mg         10/30/17 0200  vancomycin (VANCOCIN) IVPB 750 mg/150 ml premix         10/29/17 2200  aztreonam (AZACTAM) 2 g in dextrose 5 % 50 mL IVPB         10/29/17 1700  rifaximin (XIFAXAN) tablet 200 mg         10/29/17 1315  levofloxacin (LEVAQUIN) IVPB 750 mg     10/29/17 1511   10/29/17 1315  aztreonam (AZACTAM) 2 g in dextrose 5 % 50 mL IVPB     10/29/17 1410   10/29/17 1315  vancomycin (VANCOCIN) IVPB 1000 mg/200 mL premix     10/29/17 1442       Devices    LINES / TUBES:      Continuous Infusions: . aztreonam 2 g (10/31/17 0545)  . dextrose 5 %-0.9% nacl with kcl 100 mL/hr at 10/31/17 1011  . levofloxacin (LEVAQUIN) IV Stopped (10/30/17 1629)  . vancomycin Stopped (10/31/17 0545)     Objective: Vitals:   10/30/17 2347 10/31/17 0025 10/31/17 0640 10/31/17 0753  BP:  (!) 121/91 (!) 141/94 113/76  Pulse:  85    Resp:  16 18   Temp: 98.2  F (36.8 C)   97.6 F (36.4 C)  TempSrc: Oral   Axillary  SpO2:  92% 97%   Weight:      Height:        Intake/Output Summary (Last 24 hours) at 10/31/2017 1034 Last data filed at 10/31/2017 0702 Gross per 24 hour  Intake 2491.68 ml  Output 875 ml  Net 1616.68 ml   Filed Weights   10/29/17 1107  Weight: 145 lb (65.8 kg)    Physical Exam:  General: Somnolent arousable to painful stimuli, does not follow directions, No acute respiratory distress Neck:  Negative scars, masses, torticollis, lymphadenopathy, JVD Lungs: Clear to auscultation bilaterally without wheezes or crackles Cardiovascular: Regular rate and rhythm without murmur gallop or rub normal S1 and  S2 Abdomen: negative abdominal pain, EXTREMELY distended, absent bowel sounds, no rebound, no ascites, no appreciable mass Extremities: No significant cyanosis, clubbing, or edema bilateral lower extremities Skin: Multiple bruises on arms and legs secondary to multiple falls  Psychiatric: Unable to evaluate  Central nervous system: Spontaneously moves all extremities, withdraws to painful stimuli.  .     Data Reviewed: Care during the described time interval was provided by me .  I have reviewed this patient's available data, including medical history, events of note, physical examination, and all test results as part of my evaluation.   CBC: Recent Labs  Lab 10/29/17 1215 10/30/17 0831 10/31/17 0522  WBC 11.4* 9.7 5.3  NEUTROABS 8.5*  --   --   HGB 10.8* 9.4* 8.8*  HCT 34.3* 31.1* 28.6*  MCV 95.5 96.0 99.0  PLT 51* 41* 35*   Basic Metabolic Panel: Recent Labs  Lab 10/29/17 1215 10/30/17 0831 10/30/17 1834 10/31/17 0522  NA 139 139  --  135  K 3.8 2.9* 3.2* 3.9  CL 100* 105  --  105  CO2 26 27  --  24  GLUCOSE 65 75  --  159*  BUN 5* 7  --  10  CREATININE 0.79 0.73  --  0.60  CALCIUM 8.5* 7.9*  --  7.5*  MG  --   --  1.9 2.0  PHOS  --   --  2.8  --    GFR: Estimated Creatinine Clearance: 73.6 mL/min (by C-G formula based on SCr of 0.6 mg/dL). Liver Function Tests: Recent Labs  Lab 10/29/17 1215 10/30/17 0831  AST 156* 127*  ALT 97* 79*  ALKPHOS 313* 274*  BILITOT 4.1* 3.8*  PROT 6.0* 5.3*  ALBUMIN 2.7* 2.4*   No results for input(s): LIPASE, AMYLASE in the last 168 hours. Recent Labs  Lab 10/29/17 1244 10/30/17 1414 10/31/17 0522  AMMONIA 104* 45* 46*   Coagulation Profile: Recent Labs  Lab 10/29/17 1215 10/30/17 0831  INR 1.39 1.58   Cardiac Enzymes: No results for input(s): CKTOTAL, CKMB, CKMBINDEX, TROPONINI in the last 168 hours. BNP (last 3 results) No results for input(s): PROBNP in the last 8760 hours. HbA1C: No results for input(s):  HGBA1C in the last 72 hours. CBG: Recent Labs  Lab 10/30/17 1605 10/30/17 1929 10/30/17 2347 10/31/17 0430 10/31/17 0750  GLUCAP 104* 72 123* 119* 157*   Lipid Profile: No results for input(s): CHOL, HDL, LDLCALC, TRIG, CHOLHDL, LDLDIRECT in the last 72 hours. Thyroid Function Tests: No results for input(s): TSH, T4TOTAL, FREET4, T3FREE, THYROIDAB in the last 72 hours. Anemia Panel: No results for input(s): VITAMINB12, FOLATE, FERRITIN, TIBC, IRON, RETICCTPCT in the last 72 hours. Urine analysis:    Component Value Date/Time  COLORURINE AMBER (A) 10/29/2017 1317   APPEARANCEUR CLOUDY (A) 10/29/2017 1317   LABSPEC 1.025 10/29/2017 1317   PHURINE 7.0 10/29/2017 1317   GLUCOSEU NEGATIVE 10/29/2017 1317   HGBUR LARGE (A) 10/29/2017 1317   BILIRUBINUR SMALL (A) 10/29/2017 1317   KETONESUR NEGATIVE 10/29/2017 1317   PROTEINUR 30 (A) 10/29/2017 1317   NITRITE NEGATIVE 10/29/2017 1317   LEUKOCYTESUR NEGATIVE 10/29/2017 1317   Sepsis Labs: @LABRCNTIP (procalcitonin:4,lacticidven:4)  ) Recent Results (from the past 240 hour(s))  Blood culture (routine x 2)     Status: None (Preliminary result)   Collection Time: 10/29/17 12:49 PM  Result Value Ref Range Status   Specimen Description BLOOD RIGHT ANTECUBITAL  Final   Special Requests   Final    BOTTLES DRAWN AEROBIC AND ANAEROBIC Blood Culture adequate volume   Culture NO GROWTH < 24 HOURS  Final   Report Status PENDING  Incomplete  Blood culture (routine x 2)     Status: None (Preliminary result)   Collection Time: 10/29/17 12:52 PM  Result Value Ref Range Status   Specimen Description BLOOD RIGHT WRIST  Final   Special Requests   Final    BOTTLES DRAWN AEROBIC AND ANAEROBIC Blood Culture adequate volume   Culture NO GROWTH < 24 HOURS  Final   Report Status PENDING  Incomplete  MRSA PCR Screening     Status: None   Collection Time: 10/29/17  5:32 PM  Result Value Ref Range Status   MRSA by PCR NEGATIVE NEGATIVE Final     Comment:        The GeneXpert MRSA Assay (FDA approved for NASAL specimens only), is one component of a comprehensive MRSA colonization surveillance program. It is not intended to diagnose MRSA infection nor to guide or monitor treatment for MRSA infections.          Radiology Studies: Ct Head Wo Contrast  Result Date: 10/29/2017 CLINICAL DATA:  Confusion and weakness.  Unwitnessed fall EXAM: CT HEAD WITHOUT CONTRAST CT CERVICAL SPINE WITHOUT CONTRAST TECHNIQUE: Multidetector CT imaging of the head and cervical spine was performed following the standard protocol without intravenous contrast. Multiplanar CT image reconstructions of the cervical spine were also generated. COMPARISON:  Head CT October 13, 2017 FINDINGS: CT HEAD FINDINGS Brain: There is mild diffuse atrophy. There is no intracranial mass, hemorrhage, extra-axial fluid collection, or midline shift. There is slight small vessel disease in the centra semiovale bilaterally. Elsewhere gray-white compartments are normal. No evident acute infarct. Vascular: No hyperdense vessel. There is minimal calcification in each carotid siphon. Skull: The bony calvarium appears intact. Sinuses/Orbits: Visualized paranasal sinuses are clear. Orbits appear symmetric bilaterally. Other: Mastoid air cells clear. CT CERVICAL SPINE FINDINGS Alignment: There is 2 mm of anterolisthesis of C4 on C5. There is 1 mm of retrolisthesis of C5 on C6. No other spondylolisthesis evident. Skull base and vertebrae: Skull base and craniocervical junction regions appear normal. No evident fracture. There are no blastic or lytic bone lesions. Soft tissues and spinal canal: Prevertebral soft tissues appear within normal limits. Predental space region is normal. No paraspinous lesion. No cord or canal hematoma evident. Disc levels: There is severe disc space narrowing at C4-5, C5-6, and C6-7. There are anterior osteophytes at C4, C5, and C6. There is multilevel facet  osteoarthritic change. There is exit foraminal narrowing due to bony hypertrophy at C5-6 and C6-7 on the left with impression on the exiting nerve roots at these levels. There is no frank disc extrusion or stenosis. Upper  chest: There is asymmetric pleural thickening in the right apex with question of pleural effusion in this area. Lung apices otherwise are clear. Other: None IMPRESSION: CT head: Mild atrophy with slight periventricular small vessel disease. No mass or hemorrhage. No extra-axial fluid or acute infarct. Mild vascular calcification noted. CT cervical spine: No fracture. Areas of mild spondylolisthesis at C4-5 and C5-6 are felt to be due to underlying spondylosis. There is multilevel osteoarthritic change. Exit foraminal narrowing due to bony hypertrophy is noted on the left at C5-6 and C6-7. No disc extrusion evident. Asymmetric apical pleural thickening on the right with questionable layering pleural effusion on the right. Electronically Signed   By: Lowella Grip III M.D.   On: 10/29/2017 14:39   Ct Cervical Spine Wo Contrast  Result Date: 10/29/2017 CLINICAL DATA:  Confusion and weakness.  Unwitnessed fall EXAM: CT HEAD WITHOUT CONTRAST CT CERVICAL SPINE WITHOUT CONTRAST TECHNIQUE: Multidetector CT imaging of the head and cervical spine was performed following the standard protocol without intravenous contrast. Multiplanar CT image reconstructions of the cervical spine were also generated. COMPARISON:  Head CT October 13, 2017 FINDINGS: CT HEAD FINDINGS Brain: There is mild diffuse atrophy. There is no intracranial mass, hemorrhage, extra-axial fluid collection, or midline shift. There is slight small vessel disease in the centra semiovale bilaterally. Elsewhere gray-white compartments are normal. No evident acute infarct. Vascular: No hyperdense vessel. There is minimal calcification in each carotid siphon. Skull: The bony calvarium appears intact. Sinuses/Orbits: Visualized paranasal  sinuses are clear. Orbits appear symmetric bilaterally. Other: Mastoid air cells clear. CT CERVICAL SPINE FINDINGS Alignment: There is 2 mm of anterolisthesis of C4 on C5. There is 1 mm of retrolisthesis of C5 on C6. No other spondylolisthesis evident. Skull base and vertebrae: Skull base and craniocervical junction regions appear normal. No evident fracture. There are no blastic or lytic bone lesions. Soft tissues and spinal canal: Prevertebral soft tissues appear within normal limits. Predental space region is normal. No paraspinous lesion. No cord or canal hematoma evident. Disc levels: There is severe disc space narrowing at C4-5, C5-6, and C6-7. There are anterior osteophytes at C4, C5, and C6. There is multilevel facet osteoarthritic change. There is exit foraminal narrowing due to bony hypertrophy at C5-6 and C6-7 on the left with impression on the exiting nerve roots at these levels. There is no frank disc extrusion or stenosis. Upper chest: There is asymmetric pleural thickening in the right apex with question of pleural effusion in this area. Lung apices otherwise are clear. Other: None IMPRESSION: CT head: Mild atrophy with slight periventricular small vessel disease. No mass or hemorrhage. No extra-axial fluid or acute infarct. Mild vascular calcification noted. CT cervical spine: No fracture. Areas of mild spondylolisthesis at C4-5 and C5-6 are felt to be due to underlying spondylosis. There is multilevel osteoarthritic change. Exit foraminal narrowing due to bony hypertrophy is noted on the left at C5-6 and C6-7. No disc extrusion evident. Asymmetric apical pleural thickening on the right with questionable layering pleural effusion on the right. Electronically Signed   By: Lowella Grip III M.D.   On: 10/29/2017 14:39   Mr Lumbar Spine Wo Contrast  Result Date: 10/31/2017 CLINICAL DATA:  61 y/o F; sacral fracture and acute urinary retention. EXAM: MRI LUMBAR SPINE WITHOUT CONTRAST TECHNIQUE:  Multiplanar, multisequence MR imaging of the lumbar spine was performed. No intravenous contrast was administered. COMPARISON:  10/29/2017 CT abdomen and pelvis. FINDINGS: Segmentation:  Standard. Alignment:  Exaggerated lumbar lordosis without listhesis.  Vertebrae: Stable chronic anterior compression deformities of T12 and L1 vertebral bodies. Acute complex sacral fracture with impaction and mild displacement of the S2 segment and fracture lines extending into bilateral sacral ala. Bilateral L5 transverse process fractures with edema. Conus medullaris and cauda equina: Conus extends to the L1-2 level. Fracture lines extend to the bilateral S1 and S2 neural foramen, however, fat planes surrounding the nerves is intact and the foramen are patent. Paraspinal and other soft tissues: Negative. Disc levels: L1-2: No significant disc displacement, foraminal stenosis, or canal stenosis. L2-3: No significant disc displacement, foraminal stenosis, or canal stenosis. L3-4: Small disc bulge with mild facet hypertrophy. No significant foraminal or canal stenosis. L4-5: Small disc bulge and mild facet hypertrophy. No significant foraminal or canal stenosis. L5-S1: Small disc bulge and mild facet hypertrophy. No significant foraminal or canal stenosis. IMPRESSION: 1. Acute complex sacral fracture with impaction and posterior displacement of S2 segment. Posterior displaced S2 body narrows the sacral canal, however, it is patent. Possible mass effect on descending cauda equina at S2 from the narrow canal and acute edema. 2. Fracture lines extend S1 and S2 neural foramen. The neural foramen are patent. 3. Mild lumbar spondylosis. No significant foraminal or canal stenosis. Electronically Signed   By: Kristine Garbe M.D.   On: 10/31/2017 04:29   Ct Abdomen Pelvis W Contrast  Result Date: 10/29/2017 CLINICAL DATA:  Abdominal pain and distention. EXAM: CT ABDOMEN AND PELVIS WITH CONTRAST TECHNIQUE: Multidetector CT  imaging of the abdomen and pelvis was performed using the standard protocol following bolus administration of intravenous contrast. CONTRAST:  <See Chart> ISOVUE-300 IOPAMIDOL (ISOVUE-300) INJECTION 61% COMPARISON:  None. FINDINGS: Lower chest: Patchy airspace disease at the right base is compatible with pneumonia. Small loculated posterior right pleural effusion with tiny free-flowing left pleural effusion. Hepatobiliary: Markedly nodular hepatic contour is compatible with cirrhosis. No focal enhancing mass evident on this exam. Calcified gallstones measure in the 5-10 mm size range. No intrahepatic or extrahepatic biliary dilation. Pancreas: No focal mass lesion. No dilatation of the main duct. No intraparenchymal cyst. No peripancreatic edema. Spleen: Spleen is borderline enlarged at 15 cm craniocaudal length. Adrenals/Urinary Tract: No adrenal nodule or mass. 7 x 13 x 11 mm nonobstructing stone is identified in the right renal pelvis. Tiny hypoattenuating lesions in the kidneys bilaterally are too small to characterize but likely cysts. No hydronephrosis. No evidence for hydroureter. The urinary bladder appears normal for the degree of distention. Stomach/Bowel: Stomach is nondistended. No gastric wall thickening. No evidence of outlet obstruction. Duodenum is normally positioned as is the ligament of Treitz. Central small bowel loops are distended measuring up to 3.1 cm diameter. Appendix is normal. There is irregular wall thickening in the ascending colon. Transverse colon is gas-filled and distended up to 6.9 cm diameter. Colon is markedly redundant. There is mesenteric edema and pericolonic edema with a small amount of free fluid is seen in the cul-de-sac. Vascular/Lymphatic: No abdominal aortic aneurysm. No abdominal aortic atherosclerotic calcification. Portal vein is prominent but patent. Superior mesenteric vein and splenic vein are patent. Recanalization of the paraumbilical vein is consistent with  portal venous hypertension. The celiac axis and SMA are widely patent. There is no gastrohepatic or hepatoduodenal ligament lymphadenopathy. No intraperitoneal or retroperitoneal lymphadenopathy. No pelvic sidewall lymphadenopathy. Reproductive: The uterus has normal CT imaging appearance. 3.1 cm right ovarian cystic lesion evident. No left adnexal mass. Other: Small volume free fluid noted in the cul-de-sac. There is diffuse mesenteric edema. Musculoskeletal: Patient is  status post ORIF for left femoral neck fracture. Posttraumatic deformity seen in the left iliac bone. Fractures are identified in the right L4 transverse process and both L5 transverse processes and have acute appearance. There is a fracture of the right sacrum which may be acute to subacute is no bony callus is evident. Sagittal reformation show chronic compression deformity at T12 and L1. IMPRESSION: 1. Mild small bowel distension with prominent gaseous distention of the transverse colon. No obstructing lesion is identified and there is wall thickening in the ascending colon suggesting infectious/inflammatory colitis. 2. Diffuse mesenteric and pericolonic edema with small volume free fluid in the cul-de-sac. 3. Patchy airspace disease at the right base suggests pneumonia. Tiny bilateral pleural effusions are associated with some probable loculation of the right effusion. 4. Cirrhotic changes in the liver with evidence of portal venous hypertension. 5. Right sacral fracture with fractures of the right L4 and bilateral L5 transverse process ease. These fractures are acute to subacute appearance by CT. 6. Old left iliac bone fracture with chronic compression deformity at T12 and L1. Patient is status post ORIF for left femoral neck fracture. 7. Cholelithiasis 8. 7 x 13 x 11 mm nonobstructing stone in the right renal pelvis. The Electronically Signed   By: Misty Stanley M.D.   On: 10/29/2017 19:26   Dg Chest Port 1 View  Result Date:  10/29/2017 CLINICAL DATA:  Shortness of Breath and bruising EXAM: PORTABLE CHEST 1 VIEW COMPARISON:  October 13, 2017 FINDINGS: There is focal opacity in the right mid lung region. Lungs elsewhere clear. No pneumothorax. Heart size and pulmonary vascularity are normal. No adenopathy. No fracture evident. IMPRESSION: Opacity right mid lung region. Question pneumonia versus potential contusion given the clinical history. Lungs elsewhere clear. Stable cardiac silhouette. No evident pneumothorax. Electronically Signed   By: Lowella Grip III M.D.   On: 10/29/2017 12:46   Dg Abd Portable 1v  Result Date: 10/31/2017 CLINICAL DATA:  Ogilvie's syndrome. Evaluate colonic diameter. Evaluate for perforation. EXAM: PORTABLE ABDOMEN - 1 VIEW COMPARISON:  10/30/2017 FINDINGS: Gaseous distension of large and small bowel loops which is moderate and similar when compared to yesterday's film. The colon measures up to 9 cm in diameter. IMPRESSION: 1. Similar gaseous distension of large and small bowel loops compared to yesterday's exam. Electronically Signed   By: Kerby Moors M.D.   On: 10/31/2017 08:27   Dg Abd Portable 1v  Result Date: 10/30/2017 CLINICAL DATA:  Abdominal distension. EXAM: PORTABLE ABDOMEN - 1 VIEW COMPARISON:  CT of 1 day prior FINDINGS: Multiple supine images. These demonstrate mild cardiomegaly. Gaseous distension of large and small bowel loops which is moderate and similar, when compared to yesterday's scout exam. No pneumatosis. Contrast within the urinary bladder from yesterday's CT. Left proximal femoral fixation. IMPRESSION: Similar mild to moderate gaseous distention of bowel loops, when compared to yesterday's CT exam. Electronically Signed   By: Abigail Miyamoto M.D.   On: 10/30/2017 10:35   Ir Abdomen US Limited  Result Date: 10/29/2017 CLINICAL DATA:  Cirrhosis.  Abdominal distention. EXAM: LIMITED ABDOMEN ULTRASOUND FOR ASCITES TECHNIQUE: Limited ultrasound survey for ascites was  performed in all four abdominal quadrants. COMPARISON:  None. FINDINGS: Four quadrant survey of the abdomen shows insufficient volume of ascites for paracentesis. IMPRESSION: 1. Insufficient volume of ascites for paracentesis. Electronically Signed   By: Kerby Moors M.D.   On: 10/29/2017 16:29        Scheduled Meds: . dexamethasone  4 mg Intravenous Q6H  .  gabapentin  600 mg Oral TID  . insulin aspart  0-15 Units Subcutaneous Q4H  . ketorolac  30 mg Intravenous BID  . lactulose  300 mL Rectal QID  . levothyroxine  50 mcg Oral QAC breakfast  . Magnesium  200 mg Oral Daily  . rifaximin  200 mg Oral TID  . sertraline  100 mg Oral QHS  . traZODone  50 mg Oral QHS   Continuous Infusions: . aztreonam 2 g (10/31/17 0545)  . dextrose 5 %-0.9% nacl with kcl 100 mL/hr at 10/31/17 1011  . levofloxacin (LEVAQUIN) IV Stopped (10/30/17 1629)  . vancomycin Stopped (10/31/17 0545)     LOS: 2 days    Time spent: 40 minutes    WOODS, Geraldo Docker, MD [facility] Pager [phone]   If 7PM-7AM, please contact night-coverage [email or web address] Password [email or web address] 10/31/2017, 10:34 AM

## 2017-10-31 NOTE — Plan of Care (Signed)
Pt has c/o 9/10 pain during the shift with minimal relief from pain medications.

## 2017-10-31 NOTE — Consult Note (Signed)
Reason for Consult: Sacral fracture, sacral pain Referring Physician: Dr. Sherral Hammers  Megan Roberson is an 61 y.o. female.  HPI: The patient is a very ill 61 year old white female who was admitted with confusion, falls, back pain, Ogilvie syndrome, liver failure, etc.  She was worked up with a CT of the abdomen and pelvis which demonstrated bowel distention, enteric and pericolonic edema, pneumonia, liver sclerosis, old fractures at T11 and L1, cholelithiasis, nephrolithiasis lumbar transverse process fractures and a S2 fracture.  A neurosurgical consultation is requested by Dr. Sherral Hammers.  Presently the patient is very somnolent but arousable.  Her husband and father are at the bedside.  Evidently she has had some incontinence.  But she is not alert enough to query her about this. Past Medical History:  Diagnosis Date  . Anxiety   . Cirrhosis of liver ([place])   . Depression   . Heart murmur    "not concerned about "   . History of abdominal paracentesis   . History of blood transfusion   . Hypothyroidism   . Liver disease 2008  . Neuropathy   . Pancytopenia [facility])     Past Surgical History:  Procedure Laterality Date  . COLONOSCOPY WITH ESOPHAGOGASTRODUODENOSCOPY (EGD) AND ESOPHAGEAL DILATION (ED)    . ESOPHAGOGASTRODUODENOSCOPY W/ BANDING     Varices  . FRACTURE SURGERY Left    x 2  . HARDWARE REMOVAL Left 09/17/2017   Procedure: HARDWARE REMOVAL LEFT OLECRANON;  Surgeon: Altamese Beaverhead, MD;  Location: [place];  Service: Orthopedics;  Laterality: Left;  . HIP SURGERY Left    from fracture- rod in palce  . KNEE SURGERY Bilateral    open incision for ligaments  . ORIF ELBOW FRACTURE Left 04/30/2017   Procedure: REVISION OPEN REDUCTION INTERNAL FIXATION (ORIF) ELBOW/OLECRANON FRACTURE;  Surgeon: Altamese Lake Cavanaugh, MD;  Location: [place];  Service: Orthopedics;  Laterality: Left;    Family History  Adopted: Yes    Social History:  reports that  has never smoked. she has never used smokeless  tobacco. She reports that she does not drink alcohol or use drugs.  Allergies:  Allergies  Allergen Reactions  . Penicillins Swelling    FACIAL SWELLING  PATIENT HAS HAD A PCN REACTION WITH IMMEDIATE RASH, FACIAL/TONGUE/THROAT SWELLING, SOB, OR LIGHTHEADEDNESS WITH HYPOTENSION:  #  #  #  YES  #  #  #   Has patient had a PCN reaction causing severe rash involving mucus membranes or skin necrosis: No Has patient had a PCN reaction that required hospitalization: No Has patient had a PCN reaction occurring within the last 10 years: No If all of the above answers are "NO", then may proceed with Cephalosporin u  . Sulfa Antibiotics Itching and Rash    Medications:  I have reviewed the patient's current medications. Prior to Admission:  Medications Prior to Admission  Medication Sig Dispense Refill Last Dose  . Ferrous Sulfate (IRON) 325 (65 Fe) MG TABS Take 325 mg by mouth 3 (three) times daily.   10/29/2017 at Unknown time  . gabapentin (NEURONTIN) 600 MG tablet Take 600 mg by mouth 3 (three) times daily.    10/29/2017 at Unknown time  . levothyroxine (SYNTHROID, LEVOTHROID) 50 MCG tablet Take 50 mcg daily before breakfast by mouth.    10/29/2017 at Unknown time  . Magnesium 250 MG TABS Take 250 mg by mouth daily.   10/29/2017 at Unknown time  . ondansetron (ZOFRAN ODT) 4 MG disintegrating tablet Take 1 tablet (4 mg total)  every 8 (eight) hours as needed by mouth for nausea or vomiting. 20 tablet 0 prn  . oxyCODONE (ROXICODONE) 5 MG immediate release tablet Take 1-2 tablets (5-10 mg total) by mouth every 8 (eight) hours as needed for severe pain. 18 tablet 0 prn  . potassium chloride (K-DUR,KLOR-CON) 10 MEQ tablet Take 10 mEq 2 (two) times daily by mouth.   10/28/2017 at Unknown time  . rifaximin (XIFAXAN) 200 MG tablet Take 200 mg by mouth 3 (three) times daily.    10/29/2017 at Unknown time  . sertraline (ZOLOFT) 100 MG tablet Take 100 mg at bedtime by mouth.   10/28/2017 at Unknown time   . traZODone (DESYREL) 50 MG tablet Take 50 mg at bedtime by mouth.   10/28/2017 at Unknown time  . lactulose (CHRONULAC) 10 GM/15ML solution Take 20 g by mouth 3 (three) times daily.   Not Taking at Unknown time   Scheduled: . dexamethasone  4 mg Intravenous Q6H  . gabapentin  600 mg Oral TID  . insulin aspart  0-15 Units Subcutaneous Q4H  . ketorolac  30 mg Intravenous BID  . lactulose  300 mL Rectal QID  . levothyroxine  50 mcg Oral QAC breakfast  . Magnesium  200 mg Oral Daily  . rifaximin  200 mg Oral TID  . sertraline  100 mg Oral QHS  . traZODone  50 mg Oral QHS   Continuous: . aztreonam 2 g (10/31/17 1539)  . dextrose 5 %-0.9% nacl with kcl 100 mL/hr at 10/31/17 1011  . levofloxacin (LEVAQUIN) IV 750 mg (10/31/17 1539)  . vancomycin Stopped (10/31/17 0545)   ALP:[id number], ibuprofen, ondansetron (ZOFRAN) IV Anti-infectives (From admission, onward)   Start     Dose/Rate Route Frequency Ordered Stop   10/30/17 1400  levofloxacin (LEVAQUIN) IVPB 750 mg     750 mg 100 mL/hr over 90 Minutes Intravenous Every 24 hours 10/29/17 1344     10/30/17 0200  vancomycin (VANCOCIN) IVPB 750 mg/150 ml premix     750 mg 150 mL/hr over 60 Minutes Intravenous Every 12 hours 10/29/17 1344     10/29/17 2200  aztreonam (AZACTAM) 2 g in dextrose 5 % 50 mL IVPB     2 g 100 mL/hr over 30 Minutes Intravenous Every 8 hours 10/29/17 1344     10/29/17 1700  rifaximin (XIFAXAN) tablet 200 mg     200 mg Oral 3 times daily 10/29/17 1651     10/29/17 1315  levofloxacin (LEVAQUIN) IVPB 750 mg     750 mg 100 mL/hr over 90 Minutes Intravenous  Once 10/29/17 1306 10/29/17 1511   10/29/17 1315  aztreonam (AZACTAM) 2 g in dextrose 5 % 50 mL IVPB     2 g 100 mL/hr over 30 Minutes Intravenous  Once 10/29/17 1306 10/29/17 1410   10/29/17 1315  vancomycin (VANCOCIN) IVPB 1000 mg/200 mL premix     1,000 mg 200 mL/hr over 60 Minutes Intravenous  Once 10/29/17 1306 10/29/17 1442        Results for orders placed or performed during the hospital encounter of 10/29/17 (from the past 48 hour(s))  MRSA PCR Screening     Status: None   Collection Time: 10/29/17  5:32 PM  Result Value Ref Range   MRSA by PCR NEGATIVE NEGATIVE    Comment:        The GeneXpert MRSA Assay (FDA approved for NASAL specimens only), is one component of a comprehensive MRSA colonization surveillance program. It is not intended  to diagnose MRSA infection nor to guide or monitor treatment for MRSA infections.   Comprehensive metabolic panel     Status: Abnormal   Collection Time: 10/30/17  8:31 AM  Result Value Ref Range   Sodium 139 135 - 145 mmol/L   Potassium 2.9 (L) 3.5 - 5.1 mmol/L    Comment: DELTA CHECK NOTED   Chloride 105 101 - 111 mmol/L   CO2 27 22 - 32 mmol/L   Glucose, Bld 75 65 - 99 mg/dL   BUN 7 6 - 20 mg/dL   Creatinine, Ser 0.73 0.44 - 1.00 mg/dL   Calcium 7.9 (L) 8.9 - 10.3 mg/dL   Total Protein 5.3 (L) 6.5 - 8.1 g/dL   Albumin 2.4 (L) 3.5 - 5.0 g/dL   AST 127 (H) 15 - 41 U/L   ALT 79 (H) 14 - 54 U/L   Alkaline Phosphatase 274 (H) 38 - 126 U/L   Total Bilirubin 3.8 (H) 0.3 - 1.2 mg/dL   GFR calc non Af Amer >60 >60 mL/min   GFR calc Af Amer >60 >60 mL/min    Comment: (NOTE) The eGFR has been calculated using the CKD EPI equation. This calculation has not been validated in all clinical situations. eGFR's persistently <60 mL/min signify possible Chronic Kidney Disease.    Anion gap 7 5 - 15  CBC     Status: Abnormal   Collection Time: 10/30/17  8:31 AM  Result Value Ref Range   WBC 9.7 4.0 - 10.5 K/uL   RBC 3.24 (L) 3.87 - 5.11 MIL/uL   Hemoglobin 9.4 (L) 12.0 - 15.0 g/dL   HCT 31.1 (L) 36.0 - 46.0 %   MCV 96.0 78.0 - 100.0 fL   MCH 29.0 26.0 - 34.0 pg   MCHC 30.2 30.0 - 36.0 g/dL   RDW 20.1 (H) 11.5 - 15.5 %   Platelets 41 (L) 150 - 400 K/uL    Comment: REPEATED TO VERIFY CONSISTENT WITH PREVIOUS RESULT   Protime-INR     Status: Abnormal    Collection Time: 10/30/17  8:31 AM  Result Value Ref Range   Prothrombin Time 18.7 (H) 11.4 - 15.2 seconds   INR 1.58   Lactic acid, plasma     Status: None   Collection Time: 10/30/17  8:31 AM  Result Value Ref Range   Lactic Acid, Venous 1.3 0.5 - 1.9 mmol/L  Ammonia     Status: Abnormal   Collection Time: 10/30/17  2:14 PM  Result Value Ref Range   Ammonia 45 (H) 9 - 35 umol/L  Glucose, capillary     Status: Abnormal   Collection Time: 10/30/17  4:05 PM  Result Value Ref Range   Glucose-Capillary 104 (H) 65 - 99 mg/dL  Potassium     Status: Abnormal   Collection Time: 10/30/17  6:34 PM  Result Value Ref Range   Potassium 3.2 (L) 3.5 - 5.1 mmol/L  Magnesium     Status: None   Collection Time: 10/30/17  6:34 PM  Result Value Ref Range   Magnesium 1.9 1.7 - 2.4 mg/dL  Phosphorus     Status: None   Collection Time: 10/30/17  6:34 PM  Result Value Ref Range   Phosphorus 2.8 2.5 - 4.6 mg/dL  Glucose, capillary     Status: None   Collection Time: 10/30/17  7:29 PM  Result Value Ref Range   Glucose-Capillary 72 65 - 99 mg/dL  Glucose, capillary     Status: Abnormal  Collection Time: 10/30/17 11:47 PM  Result Value Ref Range   Glucose-Capillary 123 (H) 65 - 99 mg/dL  Glucose, capillary     Status: Abnormal   Collection Time: 10/31/17  4:30 AM  Result Value Ref Range   Glucose-Capillary 119 (H) 65 - 99 mg/dL  Basic metabolic panel     Status: Abnormal   Collection Time: 10/31/17  5:22 AM  Result Value Ref Range   Sodium 135 135 - 145 mmol/L   Potassium 3.9 3.5 - 5.1 mmol/L   Chloride 105 101 - 111 mmol/L   CO2 24 22 - 32 mmol/L   Glucose, Bld 159 (H) 65 - 99 mg/dL   BUN 10 6 - 20 mg/dL   Creatinine, Ser 0.60 0.44 - 1.00 mg/dL   Calcium 7.5 (L) 8.9 - 10.3 mg/dL   GFR calc non Af Amer >60 >60 mL/min   GFR calc Af Amer >60 >60 mL/min    Comment: (NOTE) The eGFR has been calculated using the CKD EPI equation. This calculation has not been validated in all clinical  situations. eGFR's persistently <60 mL/min signify possible Chronic Kidney Disease.    Anion gap 6 5 - 15  Magnesium     Status: None   Collection Time: 10/31/17  5:22 AM  Result Value Ref Range   Magnesium 2.0 1.7 - 2.4 mg/dL  CBC     Status: Abnormal   Collection Time: 10/31/17  5:22 AM  Result Value Ref Range   WBC 5.3 4.0 - 10.5 K/uL   RBC 2.89 (L) 3.87 - 5.11 MIL/uL   Hemoglobin 8.8 (L) 12.0 - 15.0 g/dL   HCT 28.6 (L) 36.0 - 46.0 %   MCV 99.0 78.0 - 100.0 fL   MCH 30.4 26.0 - 34.0 pg   MCHC 30.8 30.0 - 36.0 g/dL   RDW 20.8 (H) 11.5 - 15.5 %   Platelets 35 (L) 150 - 400 K/uL    Comment: CONSISTENT WITH PREVIOUS RESULT  Ammonia     Status: Abnormal   Collection Time: 10/31/17  5:22 AM  Result Value Ref Range   Ammonia 46 (H) 9 - 35 umol/L  Glucose, capillary     Status: Abnormal   Collection Time: 10/31/17  7:50 AM  Result Value Ref Range   Glucose-Capillary 157 (H) 65 - 99 mg/dL  Glucose, capillary     Status: Abnormal   Collection Time: 10/31/17 11:44 AM  Result Value Ref Range   Glucose-Capillary 146 (H) 65 - 99 mg/dL  Glucose, capillary     Status: Abnormal   Collection Time: 10/31/17  3:51 PM  Result Value Ref Range   Glucose-Capillary 118 (H) 65 - 99 mg/dL    Mr Lumbar Spine Wo Contrast  Result Date: 10/31/2017 CLINICAL DATA:  61 y/o F; sacral fracture and acute urinary retention. EXAM: MRI LUMBAR SPINE WITHOUT CONTRAST TECHNIQUE: Multiplanar, multisequence MR imaging of the lumbar spine was performed. No intravenous contrast was administered. COMPARISON:  10/29/2017 CT abdomen and pelvis. FINDINGS: Segmentation:  Standard. Alignment:  Exaggerated lumbar lordosis without listhesis. Vertebrae: Stable chronic anterior compression deformities of T12 and L1 vertebral bodies. Acute complex sacral fracture with impaction and mild displacement of the S2 segment and fracture lines extending into bilateral sacral ala. Bilateral L5 transverse process fractures with edema.  Conus medullaris and cauda equina: Conus extends to the L1-2 level. Fracture lines extend to the bilateral S1 and S2 neural foramen, however, fat planes surrounding the nerves is intact and the foramen are  patent. Paraspinal and other soft tissues: Negative. Disc levels: L1-2: No significant disc displacement, foraminal stenosis, or canal stenosis. L2-3: No significant disc displacement, foraminal stenosis, or canal stenosis. L3-4: Small disc bulge with mild facet hypertrophy. No significant foraminal or canal stenosis. L4-5: Small disc bulge and mild facet hypertrophy. No significant foraminal or canal stenosis. L5-S1: Small disc bulge and mild facet hypertrophy. No significant foraminal or canal stenosis. IMPRESSION: 1. Acute complex sacral fracture with impaction and posterior displacement of S2 segment. Posterior displaced S2 body narrows the sacral canal, however, it is patent. Possible mass effect on descending cauda equina at S2 from the narrow canal and acute edema. 2. Fracture lines extend S1 and S2 neural foramen. The neural foramen are patent. 3. Mild lumbar spondylosis. No significant foraminal or canal stenosis. Electronically Signed   By: Kristine Garbe M.D.   On: 10/31/2017 04:29   Ct Abdomen Pelvis W Contrast  Result Date: 10/29/2017 CLINICAL DATA:  Abdominal pain and distention. EXAM: CT ABDOMEN AND PELVIS WITH CONTRAST TECHNIQUE: Multidetector CT imaging of the abdomen and pelvis was performed using the standard protocol following bolus administration of intravenous contrast. CONTRAST:  <See Chart> ISOVUE-300 IOPAMIDOL (ISOVUE-300) INJECTION 61% COMPARISON:  None. FINDINGS: Lower chest: Patchy airspace disease at the right base is compatible with pneumonia. Small loculated posterior right pleural effusion with tiny free-flowing left pleural effusion. Hepatobiliary: Markedly nodular hepatic contour is compatible with cirrhosis. No focal enhancing mass evident on this exam. Calcified  gallstones measure in the 5-10 mm size range. No intrahepatic or extrahepatic biliary dilation. Pancreas: No focal mass lesion. No dilatation of the main duct. No intraparenchymal cyst. No peripancreatic edema. Spleen: Spleen is borderline enlarged at 15 cm craniocaudal length. Adrenals/Urinary Tract: No adrenal nodule or mass. 7 x 13 x 11 mm nonobstructing stone is identified in the right renal pelvis. Tiny hypoattenuating lesions in the kidneys bilaterally are too small to characterize but likely cysts. No hydronephrosis. No evidence for hydroureter. The urinary bladder appears normal for the degree of distention. Stomach/Bowel: Stomach is nondistended. No gastric wall thickening. No evidence of outlet obstruction. Duodenum is normally positioned as is the ligament of Treitz. Central small bowel loops are distended measuring up to 3.1 cm diameter. Appendix is normal. There is irregular wall thickening in the ascending colon. Transverse colon is gas-filled and distended up to 6.9 cm diameter. Colon is markedly redundant. There is mesenteric edema and pericolonic edema with a small amount of free fluid is seen in the cul-de-sac. Vascular/Lymphatic: No abdominal aortic aneurysm. No abdominal aortic atherosclerotic calcification. Portal vein is prominent but patent. Superior mesenteric vein and splenic vein are patent. Recanalization of the paraumbilical vein is consistent with portal venous hypertension. The celiac axis and SMA are widely patent. There is no gastrohepatic or hepatoduodenal ligament lymphadenopathy. No intraperitoneal or retroperitoneal lymphadenopathy. No pelvic sidewall lymphadenopathy. Reproductive: The uterus has normal CT imaging appearance. 3.1 cm right ovarian cystic lesion evident. No left adnexal mass. Other: Small volume free fluid noted in the cul-de-sac. There is diffuse mesenteric edema. Musculoskeletal: Patient is status post ORIF for left femoral neck fracture. Posttraumatic deformity  seen in the left iliac bone. Fractures are identified in the right L4 transverse process and both L5 transverse processes and have acute appearance. There is a fracture of the right sacrum which may be acute to subacute is no bony callus is evident. Sagittal reformation show chronic compression deformity at T12 and L1. IMPRESSION: 1. Mild small bowel distension with prominent gaseous distention  of the transverse colon. No obstructing lesion is identified and there is wall thickening in the ascending colon suggesting infectious/inflammatory colitis. 2. Diffuse mesenteric and pericolonic edema with small volume free fluid in the cul-de-sac. 3. Patchy airspace disease at the right base suggests pneumonia. Tiny bilateral pleural effusions are associated with some probable loculation of the right effusion. 4. Cirrhotic changes in the liver with evidence of portal venous hypertension. 5. Right sacral fracture with fractures of the right L4 and bilateral L5 transverse process ease. These fractures are acute to subacute appearance by CT. 6. Old left iliac bone fracture with chronic compression deformity at T12 and L1. Patient is status post ORIF for left femoral neck fracture. 7. Cholelithiasis 8. 7 x 13 x 11 mm nonobstructing stone in the right renal pelvis. The Electronically Signed   By: Misty Stanley M.D.   On: 10/29/2017 19:26   Dg Abd Portable 1v  Result Date: 10/31/2017 CLINICAL DATA:  Ogilvie's syndrome. Evaluate colonic diameter. Evaluate for perforation. EXAM: PORTABLE ABDOMEN - 1 VIEW COMPARISON:  10/30/2017 FINDINGS: Gaseous distension of large and small bowel loops which is moderate and similar when compared to yesterday's film. The colon measures up to 9 cm in diameter. IMPRESSION: 1. Similar gaseous distension of large and small bowel loops compared to yesterday's exam. Electronically Signed   By: Kerby Moors M.D.   On: 10/31/2017 08:27   Dg Abd Portable 1v  Result Date: 10/30/2017 CLINICAL DATA:   Abdominal distension. EXAM: PORTABLE ABDOMEN - 1 VIEW COMPARISON:  CT of 1 day prior FINDINGS: Multiple supine images. These demonstrate mild cardiomegaly. Gaseous distension of large and small bowel loops which is moderate and similar, when compared to yesterday's scout exam. No pneumatosis. Contrast within the urinary bladder from yesterday's CT. Left proximal femoral fixation. IMPRESSION: Similar mild to moderate gaseous distention of bowel loops, when compared to yesterday's CT exam. Electronically Signed   By: Abigail Miyamoto M.D.   On: 10/30/2017 10:35    ROS Blood pressure 101/63, pulse 85, temperature (!) 96.9 F (36.1 C), temperature source Axillary, resp. rate 18, height 5' 7"  (1.702 m), weight 65.8 kg (145 lb), SpO2 97 %. Physical Exam  General: A fairly lethargic but arousable unhealthy-appearing 61 year old white female with diffuse ecchymosis and a distended abdomen  HEENT: The patient's pupils are approximately 5 mm and reactive bilaterally.  Her sclera is icteric and edematous bilaterally  Neck: Unremarkable  Thorax: Symmetric  Abdomen: Distended  Neurologic exam: Scott coma scale 11, [id number].  The patient is very somnolent but is arousable to painful stimuli.  She is moving all 4 extremities.  The exam was somewhat limited because she cannot completely cooperate.  The best I can tell her strength is fairly normal.  I do not see any obvious cranial nerve deficits.  I am not able to test sensory or cerebellar function because of the patient's decreased mental status.  I have reviewed the patient's CT of the abdomen and pelvis performed 08/29/2017 only as it pertains to her spine.  She has a S2 fracture with a kyphotic deformity and some spinal stenosis  Of also reviewed the patient's lumbar MRI performed today.  It demonstrates her sacral fracture and some spinal stenosis.  She has old mild fractures at T12 and L1  Assessment/Plan: Sacral fracture, sacral pain: I have discussed  the situation with the patient's husband, father and with Dr. Sherral Hammers.  There really is not any good surgical option for this and furthermore she is not  a surgical candidate as she is gravely ill.  Unfortunately there is no good orthosis for this either I think her best option is a clamshell TLSO for comfort.  The fact that her abdomen is quite distended makes fitting a TLSO a bit challenging.  I will ask [employer] to fit her for a TLSO.  I have answered all their questions.  Ophelia Charter 10/31/2017, 4:07 PM

## 2017-10-31 NOTE — Progress Notes (Signed)
Daily Progress Note   Patient Name: Megan Roberson       Date: 10/31/2017 DOB: 06/14/1956  Age: 61 y.o. MRN#: [id number] Attending Physician: Allie Bossier, MD Primary Care Physician: Patient, No Pcp Per Admit Date: 10/29/2017  Reason for Consultation/Follow-up: Establishing goals of care and Psychosocial/spiritual support  Subjective: Chart reviewed, patient seen.  Discussed MRI results with Dr. Sherral Hammers, attending.  Husband is at the bedside.  He shares that he is extremely angry about care his wife is received to since moving to [place], specifically in another hospital prior to being admitted to [facility] health system.  He appears to recognize how ill she is, and is using words like" here I am getting ready to lose my wife".  Patient and husband recently moved to [place] from [place] and have very limited support here, specifically patient's husband  Length of Stay: 2  Current Medications: Scheduled Meds:  . dexamethasone  4 mg Intravenous Q6H  . gabapentin  600 mg Oral TID  . insulin aspart  0-15 Units Subcutaneous Q4H  . ketorolac  30 mg Intravenous BID  . lactulose  300 mL Rectal QID  . levothyroxine  50 mcg Oral QAC breakfast  . Magnesium  200 mg Oral Daily  . rifaximin  200 mg Oral TID  . sertraline  100 mg Oral QHS  . traZODone  50 mg Oral QHS    Continuous Infusions: . aztreonam 2 g (10/31/17 0545)  . dextrose 5 %-0.9% nacl with kcl 100 mL/hr at 10/31/17 1011  . levofloxacin (LEVAQUIN) IV Stopped (10/30/17 1629)  . vancomycin Stopped (10/31/17 0545)    PRN Meds: guaiFENesin-dextromethorphan, ibuprofen, ondansetron (ZOFRAN) IV  Physical Exam  Constitutional:  Acutely ill-appearing older female; unresponsive to voice  HENT:  Head: Normocephalic and  atraumatic.  Cardiovascular:  Heart rate 59  Pulmonary/Chest: Effort normal.  Abdominal: She exhibits distension. There is tenderness.  Genitourinary:  Genitourinary Comments: Foley  Neurological:  Unresponsive to voice and light touch  Skin: Skin is warm and dry.  Multiple areas of bruising  noted from her jawline down to her feet  Psychiatric:  Unable to test No psychomotor restlessness or overt agitation observed  Nursing note and vitals reviewed.           Vital Signs: BP 113/76 (BP Location: Left Arm)  Pulse 85   Temp 97.8 F (36.6 C) (Oral)   Resp 18   Ht 5' 7" (1.702 m)   Wt 65.8 kg (145 lb)   SpO2 97%   BMI 22.71 kg/m  SpO2: SpO2: 97 % O2 Device: O2 Device: Nasal Cannula O2 Flow Rate: O2 Flow Rate (L/min): 2 L/min  Intake/output summary:   Intake/Output Summary (Last 24 hours) at 10/31/2017 1405 Last data filed at 10/31/2017 8882 Gross per 24 hour  Intake 2091.68 ml  Output 875 ml  Net 1216.68 ml   LBM: Last BM Date: 10/30/17 Baseline Weight: Weight: 65.8 kg (145 lb) Most recent weight: Weight: 65.8 kg (145 lb)       Palliative Assessment/Data:    Flowsheet Rows     Most Recent Value  Intake Tab  Referral Department  Hospitalist  Unit at Time of Referral  Intermediate Care Unit  Palliative Care Primary Diagnosis  Other (Comment)  Date Notified  10/29/17  Palliative Care Type  New Palliative care  Reason for referral  Clarify Goals of Care  Date of Admission  10/29/17  Date first seen by Palliative Care  10/30/17  # of days Palliative referral response time  1 Day(s)  # of days IP prior to Palliative referral  0  Clinical Assessment  Palliative Performance Scale Score  30%  Pain Max last 24 hours  Not able to report  Pain Min Last 24 hours  Not able to report  Dyspnea Max Last 24 Hours  Not able to report  Dyspnea Min Last 24 hours  Not able to report  Nausea Max Last 24 Hours  Not able to report  Nausea Min Last 24 Hours  Not able to  report  Anxiety Max Last 24 Hours  Not able to report  Anxiety Min Last 24 Hours  Not able to report  Other Max Last 24 Hours  Not able to report  Psychosocial & Spiritual Assessment  Palliative Care Outcomes  Patient/Family meeting held?  Yes  Who was at the meeting?  pt's husband, father and sister  Palliative Care follow-up planned  Yes, Facility      Patient Active Problem List   Diagnosis Date Noted  . Abdominal distension   . Abdominal pain   . Increased ammonia level   . Liver failure without hepatic coma ([place])   . Thrombocytopenia ([place])   . Palliative care by specialist   . Elevated lactic acid level   . Acute hepatic encephalopathy   . Closed fracture of lumbar spine without lesion of spinal cord ([place])   . Encephalopathy 10/29/2017  . Cirrhosis of liver ([place]) 04/21/2017  . Anemia 04/21/2017  . History of open reduction and internal fixation (ORIF) procedure 04/21/2017  . Visit for suture removal 04/21/2017  . Left Olecranon fracture 04/21/2017  . Hypokalemia 04/21/2017    Palliative Care Assessment & Plan   Patient Profile: 61 y.o. female  with past medical history of NASH as well as alcohol related liver failure/cirrhosis, frequent falls, left hip fracture within the last 6 months, compression fractures, prolonged QT, elevated ammonia, pancytopenia, neuropathy admitted on 10/29/2017 with increased confusion, falls.  Patient recently has moved to [facility] from  [place].  Per patient's husband, patient was on a transplant list in [place].  She has been sober for 6 years.  They have recently attempted to establish with another GI doctor at [facility] and are hopeful to be placed on liver transplant list in [place].  She has been unable to be fully compliant with lactulose and rifaximin treatment because of her inability to reach the bathroom in time, and risk of falling.  Her ammonia level on admission was 108, platelets 41 Imaging obtained after  admission shows new acute L4, L5 fracture, compression deformity at T12 and L1.  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  She has been complaining of severe back pain, rated 10 of 10, as well as abd pain  Consult ordered for goals of care   Assessment: Met with patient while he discussed MRI results with Dr. Sherral Hammers.  Dr. Sherral Hammers also shared with patient's husband that he has contacted neurosurgery to see if there is anything that could potentially be done for her  Acute complex sacral fracture with impaction and posterior displacement of S2 segment. Posterior displaced S2 body narrows the sacral canal, however, it is patent. Possible mass effect on descending cauda equina at S2 from the narrow canal and acute edema. 2. Fracture lines extend S1 and S2 neural foramen. The neural foramen are patent. 3. Mild lumbar spondylosis. No significant foraminal or canal stenosis ( copied from MRI report)    Recommendations/Plan:  Continue to treat the treatable as well as simultaneously support and work with husband and family regarding goals of care.  Husband does not appears psychologically in a place to address CODE STATUS or other specific goals of care issues today.  Goals of Care and Additional Recommendations:  Limitations on Scope of Treatment: Full Scope Treatment  Code Status:    Code Status Orders  (From admission, onward)        Start     Ordered   10/29/17 1455  Full code  Continuous     10/29/17 1500    Code Status History    Date Active Date Inactive Code Status Order ID Comments User Context   10/13/2017 21:55 10/15/2017 12:11 Full Code [id number]  Oswald Hillock, MD Inpatient   04/21/2017 01:05 04/21/2017 21:46 Full Code [id number]  Rise Patience, MD Inpatient       Prognosis:   Unable to determine  Discharge Planning:  To Be Determined  Care plan was discussed with Dr. Sherral Hammers  Thank you for allowing the Palliative Medicine Team to assist in the care of  this patient.   Time In: 1230 Time Out: 1300 Total Time 30 min Prolonged Time Billed  no       Greater than 50%  of this time was spent counseling and coordinating care related to the above assessment and plan.  Dory Horn, NP  Please contact Palliative Medicine Team phone at [phone] for questions and concerns.

## 2017-11-01 ENCOUNTER — Inpatient Hospital Stay (HOSPITAL_COMMUNITY): Payer: Medicare Other

## 2017-11-01 DIAGNOSIS — R4182 Altered mental status, unspecified: Secondary | ICD-10-CM

## 2017-11-01 DIAGNOSIS — Z515 Encounter for palliative care: Secondary | ICD-10-CM

## 2017-11-01 LAB — BASIC METABOLIC PANEL
Anion gap: 6 (ref 5–15)
BUN: 13 mg/dL (ref 6–20)
CO2: 22 mmol/L (ref 22–32)
Calcium: 8 mg/dL — ABNORMAL LOW (ref 8.9–10.3)
Chloride: 110 mmol/L (ref 101–111)
Creatinine, Ser: 0.68 mg/dL (ref 0.44–1.00)
GFR calc Af Amer: >60 mL/min (ref >60)
GFR calc non Af Amer: >60 mL/min (ref >60)
Glucose, Bld: 125 mg/dL — ABNORMAL HIGH (ref 65–99)
Potassium: 4.7 mmol/L (ref 3.5–5.1)
Sodium: 138 mmol/L (ref 135–145)

## 2017-11-01 LAB — GLUCOSE, CAPILLARY
Glucose-Capillary: 123 mg/dL — ABNORMAL HIGH (ref 65–99)
Glucose-Capillary: 119 mg/dL — ABNORMAL HIGH (ref 65–99)
Glucose-Capillary: 116 mg/dL — ABNORMAL HIGH (ref 65–99)
Glucose-Capillary: 110 mg/dL — ABNORMAL HIGH (ref 65–99)
Glucose-Capillary: 92 mg/dL (ref 65–99)
Glucose-Capillary: 69 mg/dL (ref 65–99)
Glucose-Capillary: 82 mg/dL (ref 65–99)

## 2017-11-01 LAB — AMMONIA: Ammonia: 43 umol/L — ABNORMAL HIGH (ref 9–35)

## 2017-11-01 LAB — CBC
HCT: 32.6 % — ABNORMAL LOW (ref 36.0–46.0)
Hemoglobin: 10.1 g/dL — ABNORMAL LOW (ref 12.0–15.0)
MCH: 31 pg (ref 26.0–34.0)
MCHC: 31 g/dL (ref 30.0–36.0)
MCV: 100 fL (ref 78.0–100.0)
Platelets: 52 10*3/uL — ABNORMAL LOW (ref 150–400)
RBC: 3.26 MIL/uL — ABNORMAL LOW (ref 3.87–5.11)
RDW: 21.1 % — ABNORMAL HIGH (ref 11.5–15.5)
WBC: 8.3 10*3/uL (ref 4.0–10.5)

## 2017-11-01 LAB — MAGNESIUM: Magnesium: 2.1 mg/dL (ref 1.7–2.4)

## 2017-11-01 MED ORDER — ONDANSETRON 4 MG PO TBDP: 4.0000 mg | ORAL_TABLET | Freq: Three times a day (TID) | ORAL | Status: DC | PRN

## 2017-11-01 MED ORDER — GABAPENTIN 100 MG PO CAPS
200.0000 mg | ORAL_CAPSULE | Freq: Three times a day (TID) | ORAL | Status: DC
  Administered 2017-11-01: 200 mg via ORAL
  Filled 2017-11-01 (×2): qty 2

## 2017-11-01 MED ORDER — BISACODYL 10 MG RE SUPP
10.0000 mg | Freq: Once | RECTAL | Status: AC
  Administered 2017-11-01: 10 mg via RECTAL
  Filled 2017-11-01: qty 1

## 2017-11-01 NOTE — Progress Notes (Signed)
Patient ID: Megan Roberson, female   DOB: 1956/10/30, 62 y.o.   MRN: [id number]  This NP visited patient at the bedside as a follow up palliative medicine needs and emotional support.  Introduced myself provider working with palliative medicine this week, following his visits by Romona Curls NP over the weekend.   Husband tells me that things are "okay" today,  patient appears to be resting comfortably.  Provided information and contact number and he is encouraged to call with questions and concerns.  Palliative medicine will continue to support holistically.  No charge   Wadie Lessen NP  Palliative Medicine Team Team Phone # [phone] Pager [phone]

## 2017-11-01 NOTE — Progress Notes (Signed)
[place] TEAM 1 - Stepdown/ICU TEAM  Brianne Maina  XBD:[id number] DOB: 21-Jun-1956 DOA: 10/29/2017 PCP: Patient, No Pcp Per    Brief Narrative:  61 y.o. 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   Significant Events: 12/28 admit   Subjective: Pt is sedate.  Husband does report she has been much more interactive today in general.  He feels her abdom is signif less distended and more soft.    Assessment & Plan:  Ogilvie Syndrome / Ileus  per GI not candidate for decompression at this time - maximize lytes - trial of simethicone for comfort - suppository to stimulate movement   AMS - multifactorial Due to hepatic encephalopathy + sedating meds for MRI - slowly recovering   Sacral Fracture - Acute/Subacute L-spine MRI noted some mass-effect of the cauda equina - Neurosurgery reports no surgical options available for correction of deficit - patient to be fitted with TLSO brace  Ascites last paracentesis 1 week ago > 5 L fluid removed - IR found no ascites this admit  Loculated RIGHT pleural effusion No clinical evidence of infection - effusion not large enough or clinically significant to require thoracentesis   ESLD/Cirrhosis of Liver - Hepatic encephalopathy Cont rifaximin - resume oral lactulose as bowel motility improves   Thrombocytopenia (baseline 30-50K) At baseline  DVT prophylaxis: SCDs Code Status: FULL CODE Family Communication: spoke w/ husband at bedside  Disposition Plan: SDU  Consultants:  GI IR Neurosurgery   Antimicrobials:  Aztreonam 12/28 > Levaquin 12/28 > Vancomycin 12/28 >  Objective: Blood pressure (!) 117/105, pulse 66, temperature (!) 97.3 F (36.3 C), temperature source Oral, resp. rate (!) 47, height 5' 7"  (1.702 m), weight 65.8 kg (145 lb), SpO2 93  %.  Intake/Output Summary (Last 24 hours) at 11/01/2017 1658 Last data filed at 11/01/2017 1614 Gross per 24 hour  Intake 3261.67 ml  Output 1250 ml  Net 2011.67 ml   Filed Weights   10/29/17 1107  Weight: 65.8 kg (145 lb)    Examination: General: No acute respiratory distress - sedate Lungs: Clear to auscultation bilaterally without wheezes or crackles Cardiovascular: Regular rate and rhythm without murmur  Abdomen: soft, modestly distended, bowel sounds positive, no rebound Extremities: 1+ edema B LE   CBC: Recent Labs  Lab 10/29/17 1215 10/30/17 0831 10/31/17 0522 11/01/17 0503  WBC 11.4* 9.7 5.3 8.3  NEUTROABS 8.5*  --   --   --   HGB 10.8* 9.4* 8.8* 10.1*  HCT 34.3* 31.1* 28.6* 32.6*  MCV 95.5 96.0 99.0 100.0  PLT 51* 41* 35* 52*   Basic Metabolic Panel: Recent Labs  Lab 10/29/17 1215 10/30/17 0831 10/30/17 1834 10/31/17 0522 11/01/17 0503  NA 139 139  --  135 138  K 3.8 2.9* 3.2* 3.9 4.7  CL 100* 105  --  105 110  CO2 26 27  --  24 22  GLUCOSE 65 75  --  159* 125*  BUN 5* 7  --  10 13  CREATININE 0.79 0.73  --  0.60 0.68  CALCIUM 8.5* 7.9*  --  7.5* 8.0*  MG  --   --  1.9 2.0 2.1  PHOS  --   --  2.8  --   --    GFR: Estimated Creatinine Clearance: 73.6 mL/min (by C-G formula based on SCr of 0.68 mg/dL).  Liver Function Tests: Recent Labs  Lab 10/29/17 1215 10/30/17 0831  AST 156* 127*  ALT 97* 79*  ALKPHOS 313* 274*  BILITOT 4.1* 3.8*  PROT 6.0* 5.3*  ALBUMIN 2.7* 2.4*    Recent Labs  Lab 10/29/17 1244 10/30/17 1414 10/31/17 0522 11/01/17 0503  AMMONIA 104* 45* 46* 43*    Coagulation Profile: Recent Labs  Lab 10/29/17 1215 10/30/17 0831  INR 1.39 1.58    CBG: Recent Labs  Lab 11/01/17 0318 11/01/17 0425 11/01/17 0749 11/01/17 1200 11/01/17 1610  GLUCAP 123* 119* 116* 110* 92    Recent Results (from the past 240 hour(s))  Blood culture (routine x 2)     Status: None (Preliminary result)   Collection Time:  10/29/17 12:49 PM  Result Value Ref Range Status   Specimen Description BLOOD RIGHT ANTECUBITAL  Final   Special Requests   Final    BOTTLES DRAWN AEROBIC AND ANAEROBIC Blood Culture adequate volume   Culture NO GROWTH 3 DAYS  Final   Report Status PENDING  Incomplete  Blood culture (routine x 2)     Status: None (Preliminary result)   Collection Time: 10/29/17 12:52 PM  Result Value Ref Range Status   Specimen Description BLOOD RIGHT WRIST  Final   Special Requests   Final    BOTTLES DRAWN AEROBIC AND ANAEROBIC Blood Culture adequate volume   Culture NO GROWTH 3 DAYS  Final   Report Status PENDING  Incomplete  MRSA PCR Screening     Status: None   Collection Time: 10/29/17  5:32 PM  Result Value Ref Range Status   MRSA by PCR NEGATIVE NEGATIVE Final    Comment:        The GeneXpert MRSA Assay (FDA approved for NASAL specimens only), is one component of a comprehensive MRSA colonization surveillance program. It is not intended to diagnose MRSA infection nor to guide or monitor treatment for MRSA infections.      Scheduled Meds: . gabapentin  600 mg Oral TID  . insulin aspart  0-15 Units Subcutaneous Q4H  . ketorolac  30 mg Intravenous BID  . levothyroxine  50 mcg Oral QAC breakfast  . magnesium oxide  200 mg Oral Daily  . rifaximin  200 mg Oral TID  . sertraline  100 mg Oral QHS  . traZODone  50 mg Oral QHS     LOS: 3 days   Cherene Altes, MD [facility] Office  [phone] Pager - Text Page per Amion as per below:  On-Call/Text Page:      [email or web address]      password [email or web address]  If 7PM-7AM, please contact night-coverage [email or web address] Password [facility] 11/01/2017, 4:58 PM

## 2017-11-01 NOTE — Progress Notes (Signed)
Orthopedic Tech Progress Note Patient Details:  [name] 04-12-1956 [id number]  Patient ID: Megan Roberson, female   DOB: 21-Oct-1956, 62 y.o.   MRN: [id number]   Megan Roberson 11/01/2017, 9:18 AM Called in bio-tech brace order; spoke with Colletta Maryland

## 2017-11-02 HISTORY — PX: COLONOSCOPY: SHX174

## 2017-11-02 LAB — COMPREHENSIVE METABOLIC PANEL
ALT: 52 U/L (ref 14–54)
AST: 86 U/L — ABNORMAL HIGH (ref 15–41)
Albumin: 1.9 g/dL — ABNORMAL LOW (ref 3.5–5.0)
Alkaline Phosphatase: 257 U/L — ABNORMAL HIGH (ref 38–126)
Anion gap: 3 — ABNORMAL LOW (ref 5–15)
BUN: 14 mg/dL (ref 6–20)
CO2: 24 mmol/L (ref 22–32)
Calcium: 7.9 mg/dL — ABNORMAL LOW (ref 8.9–10.3)
Chloride: 114 mmol/L — ABNORMAL HIGH (ref 101–111)
Creatinine, Ser: 0.72 mg/dL (ref 0.44–1.00)
GFR calc Af Amer: >60 mL/min (ref >60)
GFR calc non Af Amer: >60 mL/min (ref >60)
Glucose, Bld: 84 mg/dL (ref 65–99)
Potassium: 4.6 mmol/L (ref 3.5–5.1)
Sodium: 141 mmol/L (ref 135–145)
Total Bilirubin: 1.8 mg/dL — ABNORMAL HIGH (ref 0.3–1.2)
Total Protein: 4.8 g/dL — ABNORMAL LOW (ref 6.5–8.1)

## 2017-11-02 LAB — CBC
HCT: 35.4 % — ABNORMAL LOW (ref 36.0–46.0)
Hemoglobin: 10.7 g/dL — ABNORMAL LOW (ref 12.0–15.0)
MCH: 30.7 pg (ref 26.0–34.0)
MCHC: 30.2 g/dL (ref 30.0–36.0)
MCV: 101.7 fL — ABNORMAL HIGH (ref 78.0–100.0)
Platelets: 35 10*3/uL — ABNORMAL LOW (ref 150–400)
RBC: 3.48 MIL/uL — ABNORMAL LOW (ref 3.87–5.11)
RDW: 21.9 % — ABNORMAL HIGH (ref 11.5–15.5)
WBC: 3.8 10*3/uL — ABNORMAL LOW (ref 4.0–10.5)

## 2017-11-02 LAB — GLUCOSE, CAPILLARY
Glucose-Capillary: 70 mg/dL (ref 65–99)
Glucose-Capillary: 87 mg/dL (ref 65–99)
Glucose-Capillary: 87 mg/dL (ref 65–99)

## 2017-11-02 LAB — AMMONIA: Ammonia: 13 umol/L (ref 9–35)

## 2017-11-02 LAB — PHOSPHORUS: Phosphorus: 2 mg/dL — ABNORMAL LOW (ref 2.5–4.6)

## 2017-11-02 MED ORDER — BISACODYL 10 MG RE SUPP
10.0000 mg | Freq: Once | RECTAL | Status: AC
  Administered 2017-11-02: 10 mg via RECTAL
  Filled 2017-11-02: qty 1

## 2017-11-02 MED ORDER — GABAPENTIN 100 MG PO CAPS
100.0000 mg | ORAL_CAPSULE | Freq: Three times a day (TID) | ORAL | Status: DC
Start: 2017-11-02 — End: 2017-11-04
  Administered 2017-11-02 – 2017-11-04 (×5): 100 mg via ORAL
  Filled 2017-11-02 (×5): qty 1

## 2017-11-02 MED ORDER — FENTANYL CITRATE (PF) 100 MCG/2ML IJ SOLN
INTRAMUSCULAR | Status: AC
  Filled 2017-11-02: qty 2

## 2017-11-02 MED ORDER — KCL IN DEXTROSE-NACL 20-5-0.9 MEQ/L-%-% IV SOLN
INTRAVENOUS | Status: DC
  Administered 2017-11-02: 19:00:00 via INTRAVENOUS
  Administered 2017-11-03 – 2017-11-05 (×2): 1 mL via INTRAVENOUS
  Filled 2017-11-02 (×3): qty 1000

## 2017-11-02 MED ORDER — IBUPROFEN 200 MG PO TABS
400.0000 mg | ORAL_TABLET | Freq: Four times a day (QID) | ORAL | Status: DC | PRN
  Administered 2017-11-02 – 2017-11-04 (×7): 400 mg via ORAL
  Filled 2017-11-02 (×8): qty 2

## 2017-11-02 NOTE — Progress Notes (Signed)
Pharmacy Antibiotic Note  Megan Roberson is a 62 y.o. female admitted on 10/29/2017 with increased confusion and concern for sepsis.  CXR shows loculated R pleural effusion - not amenable to drainage. Pt empirically started on aztreonam, levaquin and vancomycin.  Abx narrowed today to levaquin alone.    Plan: Continue Levaquin 750 mg IV Q 24 hours.  Height: 5' 7"  (170.2 cm) Weight: 145 lb (65.8 kg) IBW/kg (Calculated) : 61.6  Temp (24hrs), Avg:97.8 F (36.6 C), Min:97.3 F (36.3 C), Max:98.3 F (36.8 C)  Recent Labs  Lab 10/29/17 1215 10/29/17 1230 10/30/17 0831 10/31/17 0522 11/01/17 0503 11/02/17 0529  WBC 11.4*  --  9.7 5.3 8.3 3.8*  CREATININE 0.79  --  0.73 0.60 0.68 0.72  LATICACIDVEN  --  4.41* 1.3  --   --   --     Estimated Creatinine Clearance: 73.6 mL/min (by C-G formula based on SCr of 0.72 mg/dL).    Allergies  Allergen Reactions  . Penicillins Swelling    FACIAL SWELLING  PATIENT HAS HAD A PCN REACTION WITH IMMEDIATE RASH, FACIAL/TONGUE/THROAT SWELLING, SOB, OR LIGHTHEADEDNESS WITH HYPOTENSION:  #  #  #  YES  #  #  #   Has patient had a PCN reaction causing severe rash involving mucus membranes or skin necrosis: No Has patient had a PCN reaction that required hospitalization: No Has patient had a PCN reaction occurring within the last 10 years: No If all of the above answers are "NO", then may proceed with Cephalosporin u  . Sulfa Antibiotics Itching and Rash    Antimicrobials this admission: Vanc 12/28 >> 1/1 Aztreonam 12/28 >> 1/1 Levaquin 12/28 >>   Dose adjustments this admission: None   Microbiology results: 12/28 BCx: ngtd 12/28 MRSA PCR negative   Thank you for allowing pharmacy to be a part of this patient's care.  [employer], Pharm.D., BCPS Clinical Pharmacist Clinical phone for 11/02/2017 from 8:30-4:00 is [phone]. After 4pm, please call Main Rx [phone]) for assistance. 11/02/2017 11:51 AM

## 2017-11-02 NOTE — Progress Notes (Signed)
[place] TEAM 1 - Stepdown/ICU TEAM  Rosalena Mccorry  UPJ:[id number] DOB: [birth year] DOA: 10/29/2017 PCP: Patient, No Pcp Per    Brief Narrative:  62 y.o. 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   Significant Events: 12/28 admit   Subjective: Pt is much more alert today.  She c/o some generalized mild abdom pain.  Denies sob, n/v, or chest pain.  RN reports she has a bowel movement last night.    Assessment & Plan:  Ogilvie Syndrome / Ileus  per GI not candidate for decompression at this time - maximize lytes - cont simethicone for comfort - suppository to stimulate movement again tonight - advance to clear liquids   AMS - multifactorial Due to hepatic encephalopathy + sedating meds for MRI - much improved today - ammonia now normal   Sacral Fracture - Acute/Subacute L-spine MRI noted some mass-effect of the cauda equina - Neurosurgery reports no surgical options available for correction of deficit - patient has fitted with TLSO brace for use w/ ambulation   Ascites last paracentesis 1 week ago > 5 L fluid removed - IR found no ascites this admit  Loculated RIGHT pleural effusion No clinical evidence of infection - effusion not large enough or clinically significant to require thoracentesis   ESLD/Cirrhosis of Liver - Hepatic encephalopathy Cont rifaximin - resume oral lactulose as bowel motility improves   Thrombocytopenia (baseline 30-50K) At baseline  DVT prophylaxis: SCDs Code Status: FULL CODE Family Communication: spoke w/ sister at bedside   Disposition Plan: SDU  Consultants:  GI IR Neurosurgery   Antimicrobials:  Aztreonam 12/28 > 1/1 Levaquin 12/28 > Vancomycin 12/28 > 1/1  Objective: Blood pressure 99/66, pulse (!) 58, temperature 98.2 F (36.8 C), temperature source Oral, resp.  rate 13, height 5' 7"  (1.702 m), weight 65.8 kg (145 lb), SpO2 95 %.  Intake/Output Summary (Last 24 hours) at 11/02/2017 1622 Last data filed at 11/02/2017 1323 Gross per 24 hour  Intake 2395.83 ml  Output 650 ml  Net 1745.83 ml   Filed Weights   10/29/17 1107  Weight: 65.8 kg (145 lb)    Examination: General: No acute respiratory distress - alert and conversant  Lungs: Clear to auscultation B - no wheezing  Cardiovascular: RRR - no M  Abdomen: soft, modestly distended, bowel sounds positive and high pitched, no rebound Extremities: trace edema B LE   CBC: Recent Labs  Lab 10/29/17 1215 10/30/17 0831 10/31/17 0522 11/01/17 0503 11/02/17 0529  WBC 11.4* 9.7 5.3 8.3 3.8*  NEUTROABS 8.5*  --   --   --   --   HGB 10.8* 9.4* 8.8* 10.1* 10.7*  HCT 34.3* 31.1* 28.6* 32.6* 35.4*  MCV 95.5 96.0 99.0 100.0 101.7*  PLT 51* 41* 35* 52* 35*   Basic Metabolic Panel: Recent Labs  Lab 10/29/17 1215 10/30/17 0831 10/30/17 1834 10/31/17 0522 11/01/17 0503 11/02/17 0529  NA 139 139  --  135 138 141  K 3.8 2.9* 3.2* 3.9 4.7 4.6  CL 100* 105  --  105 110 114*  CO2 26 27  --  24 22 24   GLUCOSE 65 75  --  159* 125* 84  BUN 5* 7  --  10 13 14   CREATININE 0.79 0.73  --  0.60 0.68 0.72  CALCIUM 8.5* 7.9*  --  7.5* 8.0* 7.9*  MG  --   --  1.9 2.0 2.1  --   PHOS  --   --  2.8  --   --  2.0*   GFR: Estimated Creatinine Clearance: 73.6 mL/min (by C-G formula based on SCr of 0.72 mg/dL).  Liver Function Tests: Recent Labs  Lab 10/29/17 1215 10/30/17 0831 11/02/17 0529  AST 156* 127* 86*  ALT 97* 79* 52  ALKPHOS 313* 274* 257*  BILITOT 4.1* 3.8* 1.8*  PROT 6.0* 5.3* 4.8*  ALBUMIN 2.7* 2.4* 1.9*    Recent Labs  Lab 10/29/17 1244 10/30/17 1414 10/31/17 0522 11/01/17 0503 11/02/17 0529  AMMONIA 104* 45* 46* 43* 13    Coagulation Profile: Recent Labs  Lab 10/29/17 1215 10/30/17 0831  INR 1.39 1.58    CBG: Recent Labs  Lab 11/01/17 1610 11/01/17 2002  11/01/17 2320 11/02/17 0307 11/02/17 0814  GLUCAP 92 69 82 87 70    Recent Results (from the past 240 hour(s))  Blood culture (routine x 2)     Status: None (Preliminary result)   Collection Time: 10/29/17 12:49 PM  Result Value Ref Range Status   Specimen Description BLOOD RIGHT ANTECUBITAL  Final   Special Requests   Final    BOTTLES DRAWN AEROBIC AND ANAEROBIC Blood Culture adequate volume   Culture NO GROWTH 4 DAYS  Final   Report Status PENDING  Incomplete  Blood culture (routine x 2)     Status: None (Preliminary result)   Collection Time: 10/29/17 12:52 PM  Result Value Ref Range Status   Specimen Description BLOOD RIGHT WRIST  Final   Special Requests   Final    BOTTLES DRAWN AEROBIC AND ANAEROBIC Blood Culture adequate volume   Culture NO GROWTH 4 DAYS  Final   Report Status PENDING  Incomplete  MRSA PCR Screening     Status: None   Collection Time: 10/29/17  5:32 PM  Result Value Ref Range Status   MRSA by PCR NEGATIVE NEGATIVE Final    Comment:        The GeneXpert MRSA Assay (FDA approved for NASAL specimens only), is one component of a comprehensive MRSA colonization surveillance program. It is not intended to diagnose MRSA infection nor to guide or monitor treatment for MRSA infections.      Scheduled Meds: . fentaNYL      . gabapentin  200 mg Oral TID  . ketorolac  30 mg Intravenous BID  . levothyroxine  50 mcg Oral QAC breakfast  . magnesium oxide  200 mg Oral Daily  . rifaximin  200 mg Oral TID  . sertraline  100 mg Oral QHS  . traZODone  50 mg Oral QHS     LOS: 4 days   Cherene Altes, MD [facility] Office  [phone] Pager - Text Page per Amion as per below:  On-Call/Text Page:      [email or web address]      password [email or web address]  If 7PM-7AM, please contact night-coverage [email or web address] Password [facility] 11/02/2017, 4:22 PM

## 2017-11-03 ENCOUNTER — Inpatient Hospital Stay (HOSPITAL_COMMUNITY): Payer: Medicare Other

## 2017-11-03 DIAGNOSIS — R52 Pain, unspecified: Secondary | ICD-10-CM

## 2017-11-03 DIAGNOSIS — K598 Other specified functional intestinal disorders: Secondary | ICD-10-CM

## 2017-11-03 DIAGNOSIS — K7290 Hepatic failure, unspecified without coma: Secondary | ICD-10-CM

## 2017-11-03 DIAGNOSIS — K567 Ileus, unspecified: Secondary | ICD-10-CM

## 2017-11-03 DIAGNOSIS — R531 Weakness: Secondary | ICD-10-CM

## 2017-11-03 DIAGNOSIS — K5981 Ogilvie syndrome: Secondary | ICD-10-CM | POA: Diagnosis present

## 2017-11-03 LAB — CULTURE, BLOOD (ROUTINE X 2)
Culture: NO GROWTH
Culture: NO GROWTH
Special Requests: ADEQUATE
Special Requests: ADEQUATE

## 2017-11-03 LAB — COMPREHENSIVE METABOLIC PANEL
ALT: 47 U/L (ref 14–54)
AST: 89 U/L — ABNORMAL HIGH (ref 15–41)
Albumin: 1.8 g/dL — ABNORMAL LOW (ref 3.5–5.0)
Alkaline Phosphatase: 242 U/L — ABNORMAL HIGH (ref 38–126)
Anion gap: 5 (ref 5–15)
BUN: 12 mg/dL (ref 6–20)
CO2: 23 mmol/L (ref 22–32)
Calcium: 7.7 mg/dL — ABNORMAL LOW (ref 8.9–10.3)
Chloride: 110 mmol/L (ref 101–111)
Creatinine, Ser: 0.75 mg/dL (ref 0.44–1.00)
GFR calc Af Amer: >60 mL/min (ref >60)
GFR calc non Af Amer: >60 mL/min (ref >60)
Glucose, Bld: 117 mg/dL — ABNORMAL HIGH (ref 65–99)
Potassium: 3.9 mmol/L (ref 3.5–5.1)
Sodium: 138 mmol/L (ref 135–145)
Total Bilirubin: 1.9 mg/dL — ABNORMAL HIGH (ref 0.3–1.2)
Total Protein: 4.6 g/dL — ABNORMAL LOW (ref 6.5–8.1)

## 2017-11-03 LAB — CBC
HCT: 32.8 % — ABNORMAL LOW (ref 36.0–46.0)
Hemoglobin: 10 g/dL — ABNORMAL LOW (ref 12.0–15.0)
MCH: 30.7 pg (ref 26.0–34.0)
MCHC: 30.5 g/dL (ref 30.0–36.0)
MCV: 100.6 fL — ABNORMAL HIGH (ref 78.0–100.0)
Platelets: 32 10*3/uL — ABNORMAL LOW (ref 150–400)
RBC: 3.26 MIL/uL — ABNORMAL LOW (ref 3.87–5.11)
RDW: 21 % — ABNORMAL HIGH (ref 11.5–15.5)
WBC: 3.9 10*3/uL — ABNORMAL LOW (ref 4.0–10.5)

## 2017-11-03 LAB — GLUCOSE, CAPILLARY
Glucose-Capillary: 91 mg/dL (ref 65–99)
Glucose-Capillary: 84 mg/dL (ref 65–99)

## 2017-11-03 LAB — BASIC METABOLIC PANEL
Anion gap: 6 (ref 5–15)
BUN: 12 mg/dL (ref 6–20)
CO2: 22 mmol/L (ref 22–32)
Calcium: 7.7 mg/dL — ABNORMAL LOW (ref 8.9–10.3)
Chloride: 110 mmol/L (ref 101–111)
Creatinine, Ser: 0.76 mg/dL (ref 0.44–1.00)
GFR calc Af Amer: >60 mL/min (ref >60)
GFR calc non Af Amer: >60 mL/min (ref >60)
Glucose, Bld: 118 mg/dL — ABNORMAL HIGH (ref 65–99)
Potassium: 3.9 mmol/L (ref 3.5–5.1)
Sodium: 138 mmol/L (ref 135–145)

## 2017-11-03 LAB — AMMONIA: Ammonia: 39 umol/L — ABNORMAL HIGH (ref 9–35)

## 2017-11-03 LAB — MAGNESIUM: Magnesium: 1.8 mg/dL (ref 1.7–2.4)

## 2017-11-03 LAB — PHOSPHORUS: Phosphorus: 2.5 mg/dL (ref 2.5–4.6)

## 2017-11-03 NOTE — Progress Notes (Signed)
Patient ID: Megan Roberson, female   DOB: [birth year], 62 y.o.   MRN: [id number]  This NP visited patient at the bedside as a follow up for palliative medicine needs and emotional support.  No family at bedside.  Currently she denies pain or discomfort  Patient is alert and oriented and was able to engage in conversation with this nurse practitioner regarding diagnosis,  prognosis and her goals of care.  Verbalizes an basic understanding of the seriousness of her liver disease.  However there is some disconnect; they moved from [place] to [place] to be closer to her sister 6 months ago,  but they still have not connected with a GI doctor for evaluation of her liver disease.  She tells me she was on a liver transplant in [place].  Patient is open to all offered and available medical interventions to prolong life and she continues to hope ultimately for a liver transplant  We discussed personal responsibility for health and wellness and discussed with patient the importance of continued conversation with her family and their  medical providers regarding overall plan of care and treatment options,  ensuring decisions are within the context of the patients values and GOCs.  Offerd chaplain services and she agreed--Spiritual care consulted  Questions and concerns addressed  Time in 1230         Time out  1300   Total time spent on the unit was 30 minutes  Greater than 50% of the time was spent in counseling and coordination of care  PMT will continue to support holistically  Wadie Lessen NP  Palliative Medicine Team Team Phone # [phone] Pager [phone]

## 2017-11-03 NOTE — Progress Notes (Signed)
Patient wanted visit. Patient was concerned about being sick and about her husband at home.  Had prayer with patient.  Conard Novak, [place]   11/03/17 1400  Clinical Encounter Type  Visited With Patient  Visit Type Initial;Spiritual support  Referral From Nurse  Consult/Referral To Chaplain  Spiritual Encounters  Spiritual Needs Prayer;Emotional

## 2017-11-03 NOTE — Progress Notes (Signed)
PROGRESS NOTE    Megan Roberson  JOI:[id number] DOB: 08-Sep-1956 DOA: 10/29/2017 PCP: Patient, No Pcp Per   Brief Narrative:  Megan Roberson is a 62 y.o.WF  PMHx Anxiety, Depression,Heart murmur , Hypothyroidism, Pancytopenia, neuropathy, Cirhosis of liver.   Patient discharged on 10/14/2017 Brought in today by her husband who reports pt has increased confusion and frequent falls.  Husband was recently away.  He reports pt has had multiple falls while he has been gone.  He reports pt is very unsteady.  Pt has had increasing confusion.  He also reports she has had increased abdominal swelling.  He reports pt has bruises all over   Pt reports she used to drink a lot.  She reports she stopped drinking 6 years ago.   Pt reports she was on liver transplant list in [place] until she moved here.         ED Course:  Pt seen in the ED and had labs.  Pt has an ammonia level of 104, lactic acid over 4.  INR is 1.30.  Pt was noted to have hit her head recently when she fell. Ct in ED showed no acute abnormality. Ultrasound guided paracentesis has been ordered.    Subjective: 1/2 A/O 3 (does not know why), negative SOB, negative CP, negative abdominal pain. Negative postprandial N/V (on clear liquid diet).       Assessment & Plan:   Active Problems:   Encephalopathy   Abdominal distension   Abdominal pain   Increased ammonia level   Liver failure without hepatic coma (HCC)   Thrombocytopenia (HCC)   Palliative care by specialist   Elevated lactic acid level   Acute hepatic encephalopathy   Closed fracture of lumbar spine without lesion of spinal cord (HCC)  Lactic acidosis -Normalized  Ogilvie Syndrome -Correct electrolytes aggressively -12/30 KUB: Similar to previous exam: Extension 9 cm see results below - KUB on 12/31.  Require q 24 hour monitoring. -Per GI not candidate for decompression at this time.  Sacral Fracture -L-spine MRI: Review shows some mass-effect of the cauda  equina.  Discussed case with Dr. Newman Pies Neurosurgery. - Concurs no surgical options available for correction of deficit.  Given patient's multiple medical problems not a surgical candidate -Dr. Arnoldo Morale has requested [employer] attempt to fit patient with TLSO brace (will be difficult given her abdominal distention)  Ascites -Per EMR last paracentesis 1 week ago 5 L fluid removed -Patient evaluated by IR no ascites therefore no paracentesis performed.   Sepsis pneumonia?/Loculated RIGHT pleural effusion -Patient does not meet guidelines for sepsis at time of admission -Patient does have lactic acidosis, which could be secondary to her multiple falls, dehydration. -Pharmacy consulted on admission for antibiotics -Effusion small given patient's multiple medical problems not a candidate for attempted thoracentesis. -Negative fever, negative leukocytosis, negative left shift, negative bands negative fever, continue antibiotics for another 24 hours and discontinue if no additional signs or symptoms of infection.  SBP -Possible, but unlikely.   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  Increased Ammonia level/hepatic encephalopathy -Lactulose enema QID (on hold) Recent Labs  Lab 10/29/17 1244 10/30/17 1414 10/31/17 0522 11/01/17 0503 11/02/17 0529  AMMONIA 104* 45* 46* 43* 13  -Avoid PO lactulose: Will increase patient's abdominal distention.  Thrombocytopenia (baseline 30-50 K) -At baseline -INR= 1.58  Hypokalemia -D5-Normal saline +Potassium IV 60 mEq @ 155m/hr  Hypocalcemia -Corrected calcium= 8.9  Acute/Subacute L-spine fracture -L-spine MRI: Multiple acute/subacute fractures.  See  results below -Discussed case with neurosurgery agree can discontinue Decadron   Pain control -Continue Toradol -Continue Gabapentin   DVT prophylaxis: SCD Code Status: Full Family Communication: Spoke at length with husband, on  findings.  Extremely distraught.   Disposition Plan: TBD   Consultants:  GI IR   Procedures/Significant Events:  CT C-spine/CT head WO contrast: - Negative masses/hemorrhage - Areas of mild spondylolisthesis at C4-5 and C5-6 are felt to be due to underlying spondylosis. - multilevel osteoarthritic change. Exit foraminal narrowing due to bony hypertrophy is noted on the left at C5-6 and C6-7. No disc extrusion evident. 12/28 CT abdomen pelvis W contrast:-Mild small bowel distension with prominent gaseous distention of the transverse colon. No obstructing lesion is identified and there is wall thickening in the ascending colon suggesting infectious/inflammatory colitis. - Diffuse mesenteric and pericolonic edema with small volume free fluid in the cul-de-sac. -Patchy airspace disease at the right base suggests pneumonia. Tiny bilateral pleural effusions are associated with some probable loculation of the right effusion. - Cirrhotic changes in the liver with evidence of portal venous hypertension. -Right sacral fracture with fractures of the right L4 and bilateral L5 transverse process ease. These fractures are acute to subacute appearance by CT. - Old left iliac bone fracture with chronic compression deformity at T12 and L1. Patient is status post ORIF for left femoral neck fracture. -Cholelithiasis-8. 7 x 13 x 11 mm nonobstructing stone in the right renal pelvis. 12/29 MRI L-spine-Acute complex sacral fracture with impaction and posterior displacement of S2 segment. Posterior displaced S2 body narrows the sacral canal, however, it is patent. -Possible mass effect on descending cauda equina at S2 from the narrow canal and acute edema. - Fracture lines extend S1 and S2 neural foramen. The neural foramen are patent. - Mild lumbar spondylosis. No significant foraminal or canal stenosis. 12/30 KUB:-Gaseous distension of large and small bowel loops which is moderate and similar when compared to  yesterday's film.-Colon measures up to 9 cm in diameter. 1/2 KXF:GHWEX bowel decompressed. Gaseous distention of the colon, sigmoid segment measured up to 9.2 cm diameter, previously 10.6. Rectum is decompressed.   Partially calcified gallstones. Right nephrolithiasis. Left femoral Fixation hardware, incompletely visualized. Thoracolumbar dextroscoliosis apex T12-L1.   IMPRESSION: Continued decrease in  gaseous dilatation of the colon.    I have personally reviewed and interpreted all radiology studies and my findings are as above.  VENTILATOR SETTINGS:    Cultures -12/28 blood NGTD -12/28 MRSA by PCR negative -12/29 urine pending   Antimicrobials: Anti-infectives (From admission, onward)   Start     Stop   10/30/17 1400  levofloxacin (LEVAQUIN) IVPB 750 mg         10/30/17 0200  vancomycin (VANCOCIN) IVPB 750 mg/150 ml premix         10/29/17 2200  aztreonam (AZACTAM) 2 g in dextrose 5 % 50 mL IVPB         10/29/17 1700  rifaximin (XIFAXAN) tablet 200 mg         10/29/17 1315  levofloxacin (LEVAQUIN) IVPB 750 mg     10/29/17 1511   10/29/17 1315  aztreonam (AZACTAM) 2 g in dextrose 5 % 50 mL IVPB     10/29/17 1410   10/29/17 1315  vancomycin (VANCOCIN) IVPB 1000 mg/200 mL premix     10/29/17 1442       Devices    LINES / TUBES:      Continuous Infusions: . dextrose 5 % and 0.9 % NaCl  with KCl 20 mEq/L 30 mL/hr at 11/02/17 1915  . levofloxacin (LEVAQUIN) IV Stopped (11/02/17 1545)     Objective: Vitals:   11/03/17 0329 11/03/17 0400 11/03/17 0600 11/03/17 0800  BP: 104/69 105/72 108/73 114/73  Pulse: 80 60 (!) 59 (!) 57  Resp: 16 12 17 14   Temp: 98.2 F (36.8 C)   98.4 F (36.9 C)  TempSrc: Oral   Oral  SpO2: 95% 95% 96% 96%  Weight:      Height:        Intake/Output Summary (Last 24 hours) at 11/03/2017 0836 Last data filed at 11/03/2017 0600 Gross per 24 hour  Intake 618.5 ml  Output 1200 ml  Net -581.5 ml   Filed Weights   10/29/17  1107  Weight: 145 lb (65.8 kg)    Physical Exam:@@@  General: A/O 3, (does not know why) No acute respiratory distress Neck:  Negative scars, masses, torticollis, lymphadenopathy, JVD Lungs: Clear to auscultation bilaterally without wheezes or crackles Cardiovascular: Regular rate and rhythm without murmur gallop or rub normal S1 and S2 Abdomen: negative abdominal pain, EXTREMELY distended, but not as firm, positive bowel sounds, no rebound, no ascites, no appreciable mass Extremities: No significant cyanosis, clubbing, or edema bilateral lower extremities Skin: Bruises throughout body in various forms of healing from multiple falls. Psychiatric:  Negative depression, negative anxiety, negative fatigue, negative mania  Central nervous system:  Cranial nerves II through XII intact, tongue/uvula midline, all extremities muscle strength 5/5, sensation intact throughout,  negative dysarthria, negative expressive aphasia, negative receptive aphasia..     Data Reviewed: Care during the described time interval was provided by me .  I have reviewed this patient's available data, including medical history, events of note, physical examination, and all test results as part of my evaluation.   CBC: Recent Labs  Lab 10/29/17 1215 10/30/17 0831 10/31/17 0522 11/01/17 0503 11/02/17 0529  WBC 11.4* 9.7 5.3 8.3 3.8*  NEUTROABS 8.5*  --   --   --   --   HGB 10.8* 9.4* 8.8* 10.1* 10.7*  HCT 34.3* 31.1* 28.6* 32.6* 35.4*  MCV 95.5 96.0 99.0 100.0 101.7*  PLT 51* 41* 35* 52* 35*   Basic Metabolic Panel: Recent Labs  Lab 10/29/17 1215 10/30/17 0831 10/30/17 1834 10/31/17 0522 11/01/17 0503 11/02/17 0529  NA 139 139  --  135 138 141  K 3.8 2.9* 3.2* 3.9 4.7 4.6  CL 100* 105  --  105 110 114*  CO2 26 27  --  24 22 24   GLUCOSE 65 75  --  159* 125* 84  BUN 5* 7  --  10 13 14   CREATININE 0.79 0.73  --  0.60 0.68 0.72  CALCIUM 8.5* 7.9*  --  7.5* 8.0* 7.9*  MG  --   --  1.9 2.0 2.1  --     PHOS  --   --  2.8  --   --  2.0*   GFR: Estimated Creatinine Clearance: 73.6 mL/min (by C-G formula based on SCr of 0.72 mg/dL). Liver Function Tests: Recent Labs  Lab 10/29/17 1215 10/30/17 0831 11/02/17 0529  AST 156* 127* 86*  ALT 97* 79* 52  ALKPHOS 313* 274* 257*  BILITOT 4.1* 3.8* 1.8*  PROT 6.0* 5.3* 4.8*  ALBUMIN 2.7* 2.4* 1.9*   No results for input(s): LIPASE, AMYLASE in the last 168 hours. Recent Labs  Lab 10/29/17 1244 10/30/17 1414 10/31/17 0522 11/01/17 0503 11/02/17 0529  AMMONIA 104* 45* 46* 43*  13   Coagulation Profile: Recent Labs  Lab 10/29/17 1215 10/30/17 0831  INR 1.39 1.58   Cardiac Enzymes: No results for input(s): CKTOTAL, CKMB, CKMBINDEX, TROPONINI in the last 168 hours. BNP (last 3 results) No results for input(s): PROBNP in the last 8760 hours. HbA1C: No results for input(s): HGBA1C in the last 72 hours. CBG: Recent Labs  Lab 11/02/17 0307 11/02/17 0814 11/02/17 2354 11/03/17 0331 11/03/17 0815  GLUCAP 87 70 87 91 84   Lipid Profile: No results for input(s): CHOL, HDL, LDLCALC, TRIG, CHOLHDL, LDLDIRECT in the last 72 hours. Thyroid Function Tests: No results for input(s): TSH, T4TOTAL, FREET4, T3FREE, THYROIDAB in the last 72 hours. Anemia Panel: No results for input(s): VITAMINB12, FOLATE, FERRITIN, TIBC, IRON, RETICCTPCT in the last 72 hours. Urine analysis:    Component Value Date/Time   COLORURINE AMBER (A) 10/29/2017 1317   APPEARANCEUR CLOUDY (A) 10/29/2017 1317   LABSPEC 1.025 10/29/2017 1317   PHURINE 7.0 10/29/2017 1317   GLUCOSEU NEGATIVE 10/29/2017 1317   HGBUR LARGE (A) 10/29/2017 1317   BILIRUBINUR SMALL (A) 10/29/2017 1317   KETONESUR NEGATIVE 10/29/2017 1317   PROTEINUR 30 (A) 10/29/2017 1317   NITRITE NEGATIVE 10/29/2017 1317   LEUKOCYTESUR NEGATIVE 10/29/2017 1317   Sepsis Labs: @LABRCNTIP (procalcitonin:4,lacticidven:4)  ) Recent Results (from the past 240 hour(s))  Blood culture (routine x 2)      Status: None (Preliminary result)   Collection Time: 10/29/17 12:49 PM  Result Value Ref Range Status   Specimen Description BLOOD RIGHT ANTECUBITAL  Final   Special Requests   Final    BOTTLES DRAWN AEROBIC AND ANAEROBIC Blood Culture adequate volume   Culture NO GROWTH 4 DAYS  Final   Report Status PENDING  Incomplete  Blood culture (routine x 2)     Status: None (Preliminary result)   Collection Time: 10/29/17 12:52 PM  Result Value Ref Range Status   Specimen Description BLOOD RIGHT WRIST  Final   Special Requests   Final    BOTTLES DRAWN AEROBIC AND ANAEROBIC Blood Culture adequate volume   Culture NO GROWTH 4 DAYS  Final   Report Status PENDING  Incomplete  MRSA PCR Screening     Status: None   Collection Time: 10/29/17  5:32 PM  Result Value Ref Range Status   MRSA by PCR NEGATIVE NEGATIVE Final    Comment:        The GeneXpert MRSA Assay (FDA approved for NASAL specimens only), is one component of a comprehensive MRSA colonization surveillance program. It is not intended to diagnose MRSA infection nor to guide or monitor treatment for MRSA infections.          Radiology Studies: No results found.      Scheduled Meds: . gabapentin  100 mg Oral TID  . ketorolac  30 mg Intravenous BID  . levothyroxine  50 mcg Oral QAC breakfast  . magnesium oxide  200 mg Oral Daily  . rifaximin  200 mg Oral TID  . sertraline  100 mg Oral QHS  . traZODone  50 mg Oral QHS   Continuous Infusions: . dextrose 5 % and 0.9 % NaCl with KCl 20 mEq/L 30 mL/hr at 11/02/17 1915  . levofloxacin (LEVAQUIN) IV Stopped (11/02/17 1545)     LOS: 5 days    Time spent: 40 minutes    Raizy Auzenne, Geraldo Docker, MD [facility] Pager [phone]   If 7PM-7AM, please contact night-coverage [email or web address] Password [facility] 11/03/2017, 8:36 AM

## 2017-11-04 ENCOUNTER — Inpatient Hospital Stay (HOSPITAL_COMMUNITY): Payer: Medicare Other

## 2017-11-04 DIAGNOSIS — R531 Weakness: Secondary | ICD-10-CM

## 2017-11-04 DIAGNOSIS — S32009D Unspecified fracture of unspecified lumbar vertebra, subsequent encounter for fracture with routine healing: Secondary | ICD-10-CM

## 2017-11-04 LAB — LACTIC ACID, PLASMA: Lactic Acid, Venous: 1.6 mmol/L (ref 0.5–1.9)

## 2017-11-04 MED ORDER — KETOROLAC TROMETHAMINE 30 MG/ML IJ SOLN: 30.0000 mg | Freq: Two times a day (BID) | INTRAMUSCULAR | Status: DC

## 2017-11-04 MED ORDER — KETOROLAC TROMETHAMINE 30 MG/ML IJ SOLN
30.0000 mg | Freq: Two times a day (BID) | INTRAMUSCULAR | Status: DC
  Administered 2017-11-04 – 2017-11-05 (×3): 30 mg via INTRAVENOUS
  Filled 2017-11-04 (×3): qty 1

## 2017-11-04 MED ORDER — HYDROCODONE-ACETAMINOPHEN 5-325 MG PO TABS: 2.0000 | ORAL_TABLET | Freq: Once | ORAL | Status: DC

## 2017-11-04 MED ORDER — SIMETHICONE 80 MG PO CHEW
160.0000 mg | CHEWABLE_TABLET | Freq: Once | ORAL | Status: AC
  Administered 2017-11-04: 160 mg via ORAL
  Filled 2017-11-04: qty 2

## 2017-11-04 MED ORDER — NYSTATIN 100000 UNIT/ML MT SUSP
5.0000 mL | Freq: Four times a day (QID) | OROMUCOSAL | Status: DC
  Administered 2017-11-04 – 2017-11-06 (×10): 500000 [IU] via ORAL
  Filled 2017-11-04 (×11): qty 5

## 2017-11-04 MED ORDER — GABAPENTIN 100 MG PO CAPS
200.0000 mg | ORAL_CAPSULE | Freq: Three times a day (TID) | ORAL | Status: DC
  Administered 2017-11-04 – 2017-11-09 (×16): 200 mg via ORAL
  Filled 2017-11-04 (×16): qty 2

## 2017-11-04 MED ORDER — OXYCODONE HCL 5 MG PO TABS
10.0000 mg | ORAL_TABLET | Freq: Once | ORAL | Status: AC
  Administered 2017-11-04: 10 mg via ORAL
  Filled 2017-11-04: qty 2

## 2017-11-04 NOTE — Progress Notes (Addendum)
PROGRESS NOTE    Megan Roberson  KGU:[id number] DOB: [birth year] DOA: 10/29/2017 PCP: Patient, No Pcp Per   Brief Narrative:  Megan Roberson is a 62 y.o.WF  PMHx Anxiety, Depression,Heart murmur , Hypothyroidism, Pancytopenia, neuropathy, Cirhosis of liver.   Patient discharged on 10/14/2017 Brought in today by her husband who reports pt has increased confusion and frequent falls.  Husband was recently away.  He reports pt has had multiple falls while he has been gone.  He reports pt is very unsteady.  Pt has had increasing confusion.  He also reports she has had increased abdominal swelling.  He reports pt has bruises all over   Pt reports she used to drink a lot.  She reports she stopped drinking 6 years ago.   Pt reports she was on liver transplant list in [place] until she moved here.         ED Course:  Pt seen in the ED and had labs.  Pt has an ammonia level of 104, lactic acid over 4.  INR is 1.30.  Pt was noted to have hit her head recently when she fell. Ct in ED showed no acute abnormality. Ultrasound guided paracentesis has been ordered.    Subjective: 1/3 A/O 4, negative SOB negative CP positive increasing abdominal pain. Positive increasing abdominal distention. Positive increasing postprandial pain.     Assessment & Plan:   Active Problems:   Encephalopathy   Abdominal distension   Abdominal pain   Increased ammonia level   Liver failure without hepatic coma (HCC)   Thrombocytopenia (HCC)   Palliative care by specialist   Elevated lactic acid level   Acute hepatic encephalopathy   Closed fracture of lumbar spine without lesion of spinal cord (HCC)   Ogilvie's syndrome   Weakness generalized  Lactic acidosis -Pain with new onset abdominal pain obtain acute acid  Ogilvie Syndrome -Correct electrolytes aggressively -12/30 KUB: Similar to previous exam: Extension 9 cm see results below -1/3 increased abdominal pain and distention obtain KUB KUB on 12/31.   Requires every 24 hours monitor -Per GI not candidate for decompression at this time   Sacral Fracture -L-spine MRI: Review shows some mass-effect of the cauda equina.  Discussed case with Dr. Newman Pies Neurosurgery. - Concurs no surgical options available for correction of deficit.  Given patient's multiple medical problems not a surgical candidate -Dr. Arnoldo Morale has requested [employer] attempt to fit patient with TLSO brace (will be difficult given her abdominal distention)  Ascites -Per EMR last paracentesis 1 week ago 5 L fluid removed -Patient evaluated by IR no ascites therefore no paracentesis performed. -Patient's current abdominal distention not ascites but Ogilvie syndrome   Sepsis pneumonia?/Loculated RIGHT pleural effusion -Patient does not meet guidelines for sepsis at time of admission -Patient does have lactic acidosis, which could be secondary to her multiple falls, dehydration. -Pharmacy consulted on admission for antibiotics -Effusion small given patient's multiple medical problems not a candidate for attempted thoracentesis. -Negative fever, negative leukocytosis negative left shift, negative bands, no sign of infection.  SBP -Unlikely..   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  Increased Ammonia level/hepatic encephalopathy -Lactulose enema QID (on hold) Recent Labs  Lab 10/29/17 1244 10/30/17 1414 10/31/17 0522 11/01/17 0503 11/02/17 0529 11/03/17 0920  AMMONIA 104* 45* 46* 43* 13 39*  -Rifaximin 200 mg TID  Thrombocytopenia (baseline 30-50 K) -At baseline -INR= 1.58  Hypokalemia -D5-normal saline + potassium 20 mEq 30 ml/hr   Hypocalcemia -Corrected  calcium= 8.9  Acute/Subacute L-spine fracture -L-spine MRI: Multiple acute/subacute fractures.  See results below -Discussed case with neurosurgery agree can discontinue Decadroncontrol   Pain control -Toradol IV -Gabapentin increase 200 mg  TID        DVT prophylaxis: SCD Code Status: Full Family Communication: Spoke at length with husband, on findings.  Extremely distraught.   Disposition Plan: TBD   Consultants:  GI IR   Procedures/Significant Events:  CT C-spine/CT head WO contrast: - Negative masses/hemorrhage - Areas of mild spondylolisthesis at C4-5 and C5-6 are felt to be due to underlying spondylosis. - multilevel osteoarthritic change. Exit foraminal narrowing due to bony hypertrophy is noted on the left at C5-6 and C6-7. No disc extrusion evident. 12/28 CT abdomen pelvis W contrast:-Mild small bowel distension with prominent gaseous distention of the transverse colon. No obstructing lesion is identified and there is wall thickening in the ascending colon suggesting infectious/inflammatory colitis. - Diffuse mesenteric and pericolonic edema with small volume free fluid in the cul-de-sac. -Patchy airspace disease at the right base suggests pneumonia. Tiny bilateral pleural effusions are associated with some probable loculation of the right effusion. - Cirrhotic changes in the liver with evidence of portal venous hypertension. -Right sacral fracture with fractures of the right L4 and bilateral L5 transverse process ease. These fractures are acute to subacute appearance by CT. - Old left iliac bone fracture with chronic compression deformity at T12 and L1. Patient is status post ORIF for left femoral neck fracture. -Cholelithiasis-8. 7 x 13 x 11 mm nonobstructing stone in the right renal pelvis. 12/29 MRI L-spine-Acute complex sacral fracture with impaction and posterior displacement of S2 segment. Posterior displaced S2 body narrows the sacral canal, however, it is patent. -Possible mass effect on descending cauda equina at S2 from the narrow canal and acute edema. - Fracture lines extend S1 and S2 neural foramen. The neural foramen are patent. - Mild lumbar spondylosis. No significant foraminal or canal  stenosis. 12/30 KUB:-Gaseous distension of large and small bowel loops which is moderate and similar when compared to yesterday's film.-Colon measures up to 9 cm in diameter. 1/2 YKZ:LDJTT bowel decompressed. Gaseous distention of the colon, sigmoid segment measured up to 9.2 cm diameter, previously 10.6. Rectum is decompressed.   Partially calcified gallstones. Right nephrolithiasis. Left femoral Fixation hardware, incompletely visualized. Thoracolumbar dextroscoliosis apex T12-L1.   IMPRESSION: Continued decrease in  gaseous dilatation of the colon.    I have personally reviewed and interpreted all radiology studies and my findings are as above.  VENTILATOR SETTINGS:    Cultures -12/28 blood NGTD -12/28 MRSA by PCR negative -12/29 urine pending   Antimicrobials: Anti-infectives (From admission, onward)   Start     Stop   10/30/17 1400  levofloxacin (LEVAQUIN) IVPB 750 mg     11/04/17 1438   10/30/17 0200  vancomycin (VANCOCIN) IVPB 750 mg/150 ml premix  Status:  Discontinued     11/02/17 1141   10/29/17 2200  aztreonam (AZACTAM) 2 g in dextrose 5 % 50 mL IVPB  Status:  Discontinued     11/02/17 1141   10/29/17 1700  rifaximin (XIFAXAN) tablet 200 mg         10/29/17 1315  levofloxacin (LEVAQUIN) IVPB 750 mg     10/29/17 1511   10/29/17 1315  aztreonam (AZACTAM) 2 g in dextrose 5 % 50 mL IVPB     10/29/17 1410   10/29/17 1315  vancomycin (VANCOCIN) IVPB 1000 mg/200 mL premix     10/29/17  [place] / TUBES:      Continuous Infusions: . dextrose 5 % and 0.9 % NaCl with KCl 20 mEq/L 1 mL (11/03/17 2016)     Objective: Vitals:   11/04/17 0411 11/04/17 0506 11/04/17 0738 11/04/17 1234  BP: 112/71  123/79 107/61  Pulse:   (!) 58 65  Resp:   14 11  Temp: 98.2 F (36.8 C)  98.2 F (36.8 C) 98.4 F (36.9 C)  TempSrc: Oral  Oral Oral  SpO2:   97% 99%  Weight:  155 lb 6.8 oz (70.5 kg)    Height:        Intake/Output Summary (Last 24  hours) at 11/04/2017 1444 Last data filed at 11/04/2017 1425 Gross per 24 hour  Intake 1648 ml  Output 400 ml  Net 1248 ml   Filed Weights   10/29/17 1107 11/04/17 0506  Weight: 145 lb (65.8 kg) 155 lb 6.8 oz (70.5 kg)    Physical Exam:  General: A/O 4, No acute respiratory distress Neck:  Negative scars, masses, torticollis, lymphadenopathy, JVD Lungs: Clear to auscultation bilaterally without wheezes or crackles Cardiovascular: Regular rate and rhythm without murmur gallop or rub normal S1 and S2 Abdomen: Positive abdominal pain to palpation, increasing abdominal distention, increasing firmness from 1/2, positive bowel sounds, no rebound, no ascites, no appreciable mass Extremities: No significant cyanosis, clubbing, or edema bilateral lower extremities Skin: Multiple bruises throughout thorax and legs secondary to multiple falls. Psychiatric:  Negative depression, negative anxiety, negative fatigue, negative mania  Central nervous system:  Cranial nerves II through XII intact, tongue/uvula midline, all extremities muscle strength 5/5, sensation intact throughout,  negative dysarthria, negative expressive aphasia, negative receptive aphasia.     Data Reviewed: Care during the described time interval was provided by me .  I have reviewed this patient's available data, including medical history, events of note, physical examination, and all test results as part of my evaluation.   CBC: Recent Labs  Lab 10/29/17 1215 10/30/17 0831 10/31/17 0522 11/01/17 0503 11/02/17 0529 11/03/17 0920  WBC 11.4* 9.7 5.3 8.3 3.8* 3.9*  NEUTROABS 8.5*  --   --   --   --   --   HGB 10.8* 9.4* 8.8* 10.1* 10.7* 10.0*  HCT 34.3* 31.1* 28.6* 32.6* 35.4* 32.8*  MCV 95.5 96.0 99.0 100.0 101.7* 100.6*  PLT 51* 41* 35* 52* 35* 32*   Basic Metabolic Panel: Recent Labs  Lab 10/30/17 0831 10/30/17 1834 10/31/17 0522 11/01/17 0503 11/02/17 0529 11/03/17 0920  NA 139  --  135 138 141 138  138  K  2.9* 3.2* 3.9 4.7 4.6 3.9  3.9  CL 105  --  105 110 114* 110  110  CO2 27  --  24 22 24 23  22   GLUCOSE 75  --  159* 125* 84 117*  118*  BUN 7  --  10 13 14 12  12   CREATININE 0.73  --  0.60 0.68 0.72 0.75  0.76  CALCIUM 7.9*  --  7.5* 8.0* 7.9* 7.7*  7.7*  MG  --  1.9 2.0 2.1  --  1.8  PHOS  --  2.8  --   --  2.0* 2.5   GFR: Estimated Creatinine Clearance: 73.6 mL/min (by C-G formula based on SCr of 0.75 mg/dL). Liver Function Tests: Recent Labs  Lab 10/29/17 1215 10/30/17 0831 11/02/17 0529 11/03/17 0920  AST 156* 127* 86* 89*  ALT 97* 79* 52 47  ALKPHOS 313* 274* 257* 242*  BILITOT 4.1* 3.8* 1.8* 1.9*  PROT 6.0* 5.3* 4.8* 4.6*  ALBUMIN 2.7* 2.4* 1.9* 1.8*   No results for input(s): LIPASE, AMYLASE in the last 168 hours. Recent Labs  Lab 10/30/17 1414 10/31/17 0522 11/01/17 0503 11/02/17 0529 11/03/17 0920  AMMONIA 45* 46* 43* 13 39*   Coagulation Profile: Recent Labs  Lab 10/29/17 1215 10/30/17 0831  INR 1.39 1.58   Cardiac Enzymes: No results for input(s): CKTOTAL, CKMB, CKMBINDEX, TROPONINI in the last 168 hours. BNP (last 3 results) No results for input(s): PROBNP in the last 8760 hours. HbA1C: No results for input(s): HGBA1C in the last 72 hours. CBG: Recent Labs  Lab 11/02/17 0307 11/02/17 0814 11/02/17 2354 11/03/17 0331 11/03/17 0815  GLUCAP 87 70 87 91 84   Lipid Profile: No results for input(s): CHOL, HDL, LDLCALC, TRIG, CHOLHDL, LDLDIRECT in the last 72 hours. Thyroid Function Tests: No results for input(s): TSH, T4TOTAL, FREET4, T3FREE, THYROIDAB in the last 72 hours. Anemia Panel: No results for input(s): VITAMINB12, FOLATE, FERRITIN, TIBC, IRON, RETICCTPCT in the last 72 hours. Urine analysis:    Component Value Date/Time   COLORURINE AMBER (A) 10/29/2017 1317   APPEARANCEUR CLOUDY (A) 10/29/2017 1317   LABSPEC 1.025 10/29/2017 1317   PHURINE 7.0 10/29/2017 1317   GLUCOSEU NEGATIVE 10/29/2017 1317   HGBUR LARGE (A)  10/29/2017 1317   BILIRUBINUR SMALL (A) 10/29/2017 1317   KETONESUR NEGATIVE 10/29/2017 1317   PROTEINUR 30 (A) 10/29/2017 1317   NITRITE NEGATIVE 10/29/2017 1317   LEUKOCYTESUR NEGATIVE 10/29/2017 1317   Sepsis Labs: @LABRCNTIP (procalcitonin:4,lacticidven:4)  ) Recent Results (from the past 240 hour(s))  Blood culture (routine x 2)     Status: None   Collection Time: 10/29/17 12:49 PM  Result Value Ref Range Status   Specimen Description BLOOD RIGHT ANTECUBITAL  Final   Special Requests   Final    BOTTLES DRAWN AEROBIC AND ANAEROBIC Blood Culture adequate volume   Culture NO GROWTH 5 DAYS  Final   Report Status 11/03/2017 FINAL  Final  Blood culture (routine x 2)     Status: None   Collection Time: 10/29/17 12:52 PM  Result Value Ref Range Status   Specimen Description BLOOD RIGHT WRIST  Final   Special Requests   Final    BOTTLES DRAWN AEROBIC AND ANAEROBIC Blood Culture adequate volume   Culture NO GROWTH 5 DAYS  Final   Report Status 11/03/2017 FINAL  Final  MRSA PCR Screening     Status: None   Collection Time: 10/29/17  5:32 PM  Result Value Ref Range Status   MRSA by PCR NEGATIVE NEGATIVE Final    Comment:        The GeneXpert MRSA Assay (FDA approved for NASAL specimens only), is one component of a comprehensive MRSA colonization surveillance program. It is not intended to diagnose MRSA infection nor to guide or monitor treatment for MRSA infections.          Radiology Studies: Dg Abd Portable 1v  Result Date: 11/03/2017 CLINICAL DATA:  abd distention/pain today  Hx Ogilvie's syndrome EXAM: PORTABLE ABDOMEN - 1 VIEW COMPARISON:  11/01/2017 FINDINGS: Small bowel decompressed. Gaseous distention of the colon, sigmoid segment measured up to 9.2 cm diameter, previously 10.6. Rectum is decompressed. Partially calcified gallstones. Right nephrolithiasis. Left femoral Fixation hardware, incompletely visualized. Thoracolumbar dextroscoliosis apex T12-L1. IMPRESSION:  Continued decrease in  gaseous dilatation of the colon. Electronically Signed  By: Lucrezia Europe M.D.   On: 11/03/2017 09:22        Scheduled Meds: . gabapentin  100 mg Oral TID  . levothyroxine  50 mcg Oral QAC breakfast  . magnesium oxide  200 mg Oral Daily  . nystatin  5 mL Oral QID  . rifaximin  200 mg Oral TID  . sertraline  100 mg Oral QHS  . traZODone  50 mg Oral QHS   Continuous Infusions: . dextrose 5 % and 0.9 % NaCl with KCl 20 mEq/L 1 mL (11/03/17 2016)     LOS: 6 days    Time spent: 40 minutes    Megan Roberson, Geraldo Docker, MD [facility] Pager [phone]   If 7PM-7AM, please contact night-coverage [email or web address] Password [facility] 11/04/2017, 2:44 PM

## 2017-11-04 NOTE — Care Management Important Message (Signed)
Important Message  Patient Details  Name: Megan Roberson MRN: [id number] Date of Birth: 09-06-1956   Medicare Important Message Given:  Yes    Nathen May 11/04/2017, 9:36 AM

## 2017-11-04 NOTE — Progress Notes (Addendum)
ES Liver Disease/ hepatic encephalopathy, l spine fx, loculated r pl effusion , elevated ammonia level, on clears, palliative consulted, would like medical interventions to prolong life in hopes for a transplant, conts on d5, levaquin, plan home when stable. Patient states her PCP is Richmond Campbell with [facility].  Her spouse would like for physical therapy to see patient, NCM notified MD , he will order pt eval and ot eval.

## 2017-11-05 LAB — MAGNESIUM: Magnesium: 1.7 mg/dL (ref 1.7–2.4)

## 2017-11-05 LAB — CBC
HCT: 33.4 % — ABNORMAL LOW (ref 36.0–46.0)
Hemoglobin: 10.1 g/dL — ABNORMAL LOW (ref 12.0–15.0)
MCH: 29.1 pg (ref 26.0–34.0)
MCHC: 30.2 g/dL (ref 30.0–36.0)
MCV: 96.3 fL (ref 78.0–100.0)
Platelets: 29 10*3/uL — CL (ref 150–400)
RBC: 3.47 MIL/uL — ABNORMAL LOW (ref 3.87–5.11)
RDW: 19.5 % — ABNORMAL HIGH (ref 11.5–15.5)
WBC: 4.3 10*3/uL (ref 4.0–10.5)

## 2017-11-05 LAB — BASIC METABOLIC PANEL
Anion gap: 5 (ref 5–15)
BUN: 12 mg/dL (ref 6–20)
CO2: 22 mmol/L (ref 22–32)
Calcium: 7.5 mg/dL — ABNORMAL LOW (ref 8.9–10.3)
Chloride: 111 mmol/L (ref 101–111)
Creatinine, Ser: 0.62 mg/dL (ref 0.44–1.00)
GFR calc Af Amer: >60 mL/min (ref >60)
GFR calc non Af Amer: >60 mL/min (ref >60)
Glucose, Bld: 85 mg/dL (ref 65–99)
Potassium: 3.6 mmol/L (ref 3.5–5.1)
Sodium: 138 mmol/L (ref 135–145)

## 2017-11-05 LAB — PHOSPHORUS: Phosphorus: 3.1 mg/dL (ref 2.5–4.6)

## 2017-11-05 MED ORDER — DIPHENHYDRAMINE HCL 12.5 MG/5ML PO ELIX
12.5000 mg | ORAL_SOLUTION | Freq: Once | ORAL | Status: AC
  Administered 2017-11-05: 12.5 mg via ORAL
  Filled 2017-11-05: qty 5

## 2017-11-05 MED ORDER — HYOSCYAMINE SULFATE 0.5 MG/ML IJ SOLN
0.2500 mg | Freq: Four times a day (QID) | INTRAMUSCULAR | Status: DC | PRN
  Administered 2017-11-06: 0.25 mg via INTRAVENOUS
  Filled 2017-11-05 (×2): qty 0.5

## 2017-11-05 MED ORDER — SENNOSIDES-DOCUSATE SODIUM 8.6-50 MG PO TABS
1.0000 | ORAL_TABLET | Freq: Two times a day (BID) | ORAL | Status: DC
  Administered 2017-11-05 – 2017-11-09 (×8): 1 via ORAL
  Filled 2017-11-05 (×8): qty 1

## 2017-11-05 NOTE — Progress Notes (Signed)
CRITICAL VALUE ALERT  Critical Value: Platelets- 29  Date & Time Notied: 11/05/17  Provider Notified: NP [place]  Orders Received/Actions taken: no new orders

## 2017-11-05 NOTE — Progress Notes (Signed)
[place] TEAM 1 - Stepdown/ICU TEAM  Zelma Snead  WUX:[id number] DOB: 03/13/1956 DOA: 10/29/2017 PCP: Patient, No Pcp Per    Brief Narrative:  62 y.o. 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   Significant Events: 12/28 admit   Subjective: The patient reports that she is feeling much better.  She denies having had a bowel movement in the last 24 hours but does report that she is passing gas and has noted very loud bowel sounds.  She denies nausea or vomiting.  She denies chest pain or shortness of breath.  She is alert and much more conversant than the last time I saw her.  Assessment & Plan:  Ogilvie Syndrome / Ileus  per GI not candidate for decompression at this time - maximize lytes - cont simethicone for comfort - advance to clear liquids again today and follow   AMS - multifactorial Due to hepatic encephalopathy + sedating meds for MRI - much improved / back to baseline   Sacral Fracture - Acute/Subacute L-spine MRI noted some mass-effect of the cauda equina - Neurosurgery reports no surgical options available for correction of deficit - patient has fitted with TLSO brace for use w/ ambulation   Ascites last paracentesis 1 week ago > 5 L fluid removed - IR found no ascites this admit  Loculated RIGHT pleural effusion No clinical evidence of infection - effusion not large enough or clinically significant to require thoracentesis   ESLD/Cirrhosis of Liver - Hepatic encephalopathy Cont rifaximin   Thrombocytopenia (baseline 30-50K) At baseline  DVT prophylaxis: SCDs Code Status: FULL CODE Family Communication: spoke w/ husband at bedside   Disposition Plan: SDU  Consultants:  GI IR Neurosurgery   Antimicrobials:  Aztreonam 12/28 > 1/1 Levaquin 12/28 > 1/3 Vancomycin 12/28 >  1/1  Objective: Blood pressure 111/68, pulse (!) 59, temperature 98 F (36.7 C), temperature source Oral, resp. rate 16, height 5' 7"  (1.702 m), weight 70.5 kg (155 lb 6.8 oz), SpO2 95 %.  Intake/Output Summary (Last 24 hours) at 11/05/2017 1612 Last data filed at 11/05/2017 0900 Gross per 24 hour  Intake 742 ml  Output -  Net 742 ml   Filed Weights   10/29/17 1107 11/04/17 0506 11/05/17 0552  Weight: 65.8 kg (145 lb) 70.5 kg (155 lb 6.8 oz) 70.5 kg (155 lb 6.8 oz)    Examination: General: No acute respiratory distress - alert/conversant  Lungs: Clear to auscultation B Cardiovascular: RRR  Abdomen: soft, modestly distended, bowel sounds positive/high pitched, no rebound, nontender Extremities: no signif edema B LE   CBC: Recent Labs  Lab 10/31/17 0522 11/01/17 0503 11/02/17 0529 11/03/17 0920 11/05/17 0517  WBC 5.3 8.3 3.8* 3.9* 4.3  HGB 8.8* 10.1* 10.7* 10.0* 10.1*  HCT 28.6* 32.6* 35.4* 32.8* 33.4*  MCV 99.0 100.0 101.7* 100.6* 96.3  PLT 35* 52* 35* 32* 29*   Basic Metabolic Panel: Recent Labs  Lab 10/30/17 1834 10/31/17 0522 11/01/17 0503 11/02/17 0529 11/03/17 0920 11/05/17 0517  NA  --  135 138 141 138  138 138  K 3.2* 3.9 4.7 4.6 3.9  3.9 3.6  CL  --  105 110 114* 110  110 111  CO2  --  24 22 24 23  22 22   GLUCOSE  --  159* 125* 84 117*  118* 85  BUN  --  10 13 14 12  12 12   CREATININE  --  0.60 0.68 0.72 0.75  0.76 0.62  CALCIUM  --  7.5* 8.0* 7.9* 7.7*  7.7* 7.5*  MG 1.9 2.0 2.1  --  1.8 1.7  PHOS 2.8  --   --  2.0* 2.5 3.1   GFR: Estimated Creatinine Clearance: 73.6 mL/min (by C-G formula based on SCr of 0.62 mg/dL).  Liver Function Tests: Recent Labs  Lab 10/30/17 0831 11/02/17 0529 11/03/17 0920  AST 127* 86* 89*  ALT 79* 52 47  ALKPHOS 274* 257* 242*  BILITOT 3.8* 1.8* 1.9*  PROT 5.3* 4.8* 4.6*  ALBUMIN 2.4* 1.9* 1.8*    Recent Labs  Lab 10/30/17 1414 10/31/17 0522 11/01/17 0503 11/02/17 0529 11/03/17 0920  AMMONIA 45*  46* 43* 13 39*    Coagulation Profile: Recent Labs  Lab 10/30/17 0831  INR 1.58    CBG: Recent Labs  Lab 11/02/17 0307 11/02/17 0814 11/02/17 2354 11/03/17 0331 11/03/17 0815  GLUCAP 87 70 87 91 84    Recent Results (from the past 240 hour(s))  Blood culture (routine x 2)     Status: None   Collection Time: 10/29/17 12:49 PM  Result Value Ref Range Status   Specimen Description BLOOD RIGHT ANTECUBITAL  Final   Special Requests   Final    BOTTLES DRAWN AEROBIC AND ANAEROBIC Blood Culture adequate volume   Culture NO GROWTH 5 DAYS  Final   Report Status 11/03/2017 FINAL  Final  Blood culture (routine x 2)     Status: None   Collection Time: 10/29/17 12:52 PM  Result Value Ref Range Status   Specimen Description BLOOD RIGHT WRIST  Final   Special Requests   Final    BOTTLES DRAWN AEROBIC AND ANAEROBIC Blood Culture adequate volume   Culture NO GROWTH 5 DAYS  Final   Report Status 11/03/2017 FINAL  Final  MRSA PCR Screening     Status: None   Collection Time: 10/29/17  5:32 PM  Result Value Ref Range Status   MRSA by PCR NEGATIVE NEGATIVE Final    Comment:        The GeneXpert MRSA Assay (FDA approved for NASAL specimens only), is one component of a comprehensive MRSA colonization surveillance program. It is not intended to diagnose MRSA infection nor to guide or monitor treatment for MRSA infections.      Scheduled Meds: . gabapentin  200 mg Oral TID  . levothyroxine  50 mcg Oral QAC breakfast  . magnesium oxide  200 mg Oral Daily  . nystatin  5 mL Oral QID  . rifaximin  200 mg Oral TID  . sertraline  100 mg Oral QHS  . traZODone  50 mg Oral QHS     LOS: 7 days   Cherene Altes, MD [facility] Office  [phone] Pager - Text Page per Amion as per below:  On-Call/Text Page:      [email or web address]      password [email or web address]  If 7PM-7AM, please contact night-coverage [email or web address] Password [email or web address] 11/05/2017, 4:12 PM

## 2017-11-06 ENCOUNTER — Inpatient Hospital Stay (HOSPITAL_COMMUNITY): Payer: Medicare Other

## 2017-11-06 DIAGNOSIS — E440 Moderate protein-calorie malnutrition: Secondary | ICD-10-CM | POA: Diagnosis present

## 2017-11-06 DIAGNOSIS — E43 Unspecified severe protein-calorie malnutrition: Secondary | ICD-10-CM | POA: Diagnosis present

## 2017-11-06 LAB — CBC
HCT: 35.1 % — ABNORMAL LOW (ref 36.0–46.0)
Hemoglobin: 10.6 g/dL — ABNORMAL LOW (ref 12.0–15.0)
MCH: 29 pg (ref 26.0–34.0)
MCHC: 30.2 g/dL (ref 30.0–36.0)
MCV: 96.2 fL (ref 78.0–100.0)
Platelets: 34 10*3/uL — ABNORMAL LOW (ref 150–400)
RBC: 3.65 MIL/uL — ABNORMAL LOW (ref 3.87–5.11)
RDW: 19.4 % — ABNORMAL HIGH (ref 11.5–15.5)
WBC: 5 10*3/uL (ref 4.0–10.5)

## 2017-11-06 LAB — COMPREHENSIVE METABOLIC PANEL
ALT: 35 U/L (ref 14–54)
AST: 62 U/L — ABNORMAL HIGH (ref 15–41)
Albumin: 2 g/dL — ABNORMAL LOW (ref 3.5–5.0)
Alkaline Phosphatase: 268 U/L — ABNORMAL HIGH (ref 38–126)
Anion gap: 7 (ref 5–15)
BUN: 10 mg/dL (ref 6–20)
CO2: 23 mmol/L (ref 22–32)
Calcium: 7.9 mg/dL — ABNORMAL LOW (ref 8.9–10.3)
Chloride: 110 mmol/L (ref 101–111)
Creatinine, Ser: 0.64 mg/dL (ref 0.44–1.00)
GFR calc Af Amer: >60 mL/min (ref >60)
GFR calc non Af Amer: >60 mL/min (ref >60)
Glucose, Bld: 90 mg/dL (ref 65–99)
Potassium: 3.8 mmol/L (ref 3.5–5.1)
Sodium: 140 mmol/L (ref 135–145)
Total Bilirubin: 2.7 mg/dL — ABNORMAL HIGH (ref 0.3–1.2)
Total Protein: 5.1 g/dL — ABNORMAL LOW (ref 6.5–8.1)

## 2017-11-06 LAB — PHOSPHORUS: Phosphorus: 3 mg/dL (ref 2.5–4.6)

## 2017-11-06 LAB — MAGNESIUM: Magnesium: 1.8 mg/dL (ref 1.7–2.4)

## 2017-11-06 MED ORDER — OXYCODONE HCL 5 MG PO TABS
5.0000 mg | ORAL_TABLET | Freq: Four times a day (QID) | ORAL | Status: DC | PRN
  Administered 2017-11-06 – 2017-11-09 (×9): 5 mg via ORAL
  Filled 2017-11-06 (×10): qty 1

## 2017-11-06 MED ORDER — BOOST / RESOURCE BREEZE PO LIQD CUSTOM: 1.0000 | Freq: Two times a day (BID) | ORAL | Status: DC

## 2017-11-06 MED ORDER — ENSURE ENLIVE PO LIQD
237.0000 mL | Freq: Two times a day (BID) | ORAL | Status: DC
  Administered 2017-11-06 – 2017-11-09 (×7): 237 mL via ORAL

## 2017-11-06 MED ORDER — PRO-STAT SUGAR FREE PO LIQD
30.0000 mL | Freq: Two times a day (BID) | ORAL | Status: DC
  Administered 2017-11-06 (×2): 30 mL via ORAL
  Filled 2017-11-06 (×2): qty 30

## 2017-11-06 MED ORDER — PRO-STAT SUGAR FREE PO LIQD
30.0000 mL | Freq: Two times a day (BID) | ORAL | Status: DC
  Administered 2017-11-06 – 2017-11-09 (×6): 30 mL via ORAL
  Filled 2017-11-06 (×6): qty 30

## 2017-11-06 MED ORDER — LIDOCAINE VISCOUS 2 % MT SOLN
15.0000 mL | Freq: Four times a day (QID) | OROMUCOSAL | Status: DC | PRN
  Filled 2017-11-06: qty 15

## 2017-11-06 MED ORDER — IBUPROFEN 200 MG PO TABS
200.0000 mg | ORAL_TABLET | Freq: Four times a day (QID) | ORAL | Status: DC | PRN
  Administered 2017-11-06 – 2017-11-07 (×2): 200 mg via ORAL
  Filled 2017-11-06 (×2): qty 1

## 2017-11-06 NOTE — Progress Notes (Signed)
Physical Therapy Evaluation Patient Details Name: Megan Roberson MRN: [id number] DOB: October 09, 1956 Today's Date: 11/06/2017   History of Present Illness  Pt is a 62 year old white female who was admitted with confusion, falls, back pain, Ogilvie syndrome, liver failure, etc.  She was worked up with a CT of the abdomen and pelvis which demonstrated bowel distention, enteric and pericolonic edema, pneumonia, liver sclerosis, old fractures at T11 and L1, cholelithiasis, nephrolithiasis lumbar transverse process fractures and a S2 fracture. No surgical treatment per neurosurg.    Clinical Impression  Pt presented supine in bed with HOB elevated, awake and willing to participate in therapy session. Pt's husband present at beginning of session, but left before initiation of mobility. Prior to admission, pt reported that she ambulated independently and required assistance from her husband for ADLs. Pt reported having several recent falls and not being able to recall much about them. Pt currently very limited secondary to abdominal pain. Pt only able to tolerate bed mobility with mod A and sitting EOB for a brief period of time. Pt would continue to benefit from skilled physical therapy services at this time while admitted and after d/c to address the below listed limitations in order to improve overall safety and independence with functional mobility.     Follow Up Recommendations SNF;Supervision/Assistance - 24 hour    Equipment Recommendations  None recommended by PT    Recommendations for Other Services       Precautions / Restrictions Precautions Precautions: Fall;Back Required Braces or Orthoses: Spinal Brace Spinal Brace: Thoracolumbosacral orthotic Restrictions Weight Bearing Restrictions: No      Mobility  Bed Mobility Overal bed mobility: Needs Assistance Bed Mobility: Supine to Sit;Sit to Supine     Supine to sit: Mod assist Sit to supine: Mod assist   General bed mobility  comments: increased time and effort, use of bed rails, assist to elevate trunk and use of bed pads to adjust pt's hips at EOB  Transfers                 General transfer comment: unable to tolerate this session secondary to abdominal pain  Ambulation/Gait                Stairs            Wheelchair Mobility    Modified Rankin (Stroke Patients Only)       Balance Overall balance assessment: Needs assistance;History of Falls Sitting-balance support: Feet supported;Bilateral upper extremity supported Sitting balance-Leahy Scale: Poor Sitting balance - Comments: bracing herself secondary to abdominal pain Postural control: Posterior lean                                   Pertinent Vitals/Pain Pain Assessment: Faces Faces Pain Scale: Hurts whole lot Pain Location: abdomen Pain Descriptors / Indicators: Sore;Grimacing;Guarding;Moaning Pain Intervention(s): Monitored during session;Repositioned    Home Living Family/patient expects to be discharged to:: Private residence Living Arrangements: Spouse/significant other Available Help at Discharge: Family Type of Home: House Home Access: Stairs to enter   Technical brewer of Steps: 1 Home Layout: One level Home Equipment: Cane - single point;Shower seat;Bedside commode;Wheelchair - manual      Prior Function Level of Independence: Needs assistance   Gait / Transfers Assistance Needed: independent  ADL's / Homemaking Assistance Needed: husband assists with bathing and dressing        Hand Dominance   Dominant Hand: Right  Extremity/Trunk Assessment   Upper Extremity Assessment Upper Extremity Assessment: Generalized weakness;LUE deficits/detail LUE Deficits / Details: passive and active ROM limitations at elbow (hx of prior sx); unable to achieve full elbow extension LUE: Unable to fully assess due to pain    Lower Extremity Assessment Lower Extremity Assessment:  Generalized weakness(bruising bilaterally)       Communication   Communication: No difficulties  Cognition Arousal/Alertness: Awake/alert Behavior During Therapy: WFL for tasks assessed/performed Overall Cognitive Status: Within Functional Limits for tasks assessed                                        General Comments      Exercises     Assessment/Plan    PT Assessment Patient needs continued PT services  PT Problem List Decreased strength;Decreased activity tolerance;Decreased balance;Decreased mobility;Decreased coordination;Decreased safety awareness;Decreased knowledge of use of DME;Decreased knowledge of precautions;Pain       PT Treatment Interventions DME instruction;Gait training;Stair training;Functional mobility training;Therapeutic activities;Therapeutic exercise;Balance training;Neuromuscular re-education;Patient/family education    PT Goals (Current goals can be found in the Care Plan section)  Acute Rehab PT Goals Patient Stated Goal: decrease pain PT Goal Formulation: With patient Time For Goal Achievement: 11/20/17 Potential to Achieve Goals: Good    Frequency Min 3X/week   Barriers to discharge        Co-evaluation               AM-PAC PT "6 Clicks" Daily Activity  Outcome Measure Difficulty turning over in bed (including adjusting bedclothes, sheets and blankets)?: Unable Difficulty moving from lying on back to sitting on the side of the bed? : Unable Difficulty sitting down on and standing up from a chair with arms (e.g., wheelchair, bedside commode, etc,.)?: Unable Help needed moving to and from a bed to chair (including a wheelchair)?: A Lot Help needed walking in hospital room?: A Lot Help needed climbing 3-5 steps with a railing? : Total 6 Click Score: 8    End of Session   Activity Tolerance: Patient limited by pain;Patient limited by fatigue Patient left: in bed;with call bell/phone within reach;with bed alarm  set Nurse Communication: Mobility status PT Visit Diagnosis: Other abnormalities of gait and mobility (R26.89)    Time: 2263-3354 PT Time Calculation (min) (ACUTE ONLY): 24 min   Charges:   PT Evaluation $PT Eval Moderate Complexity: 1 Mod PT Treatments $Therapeutic Activity: 8-22 mins   PT G Codes:        [place], PT, DPT [place] 11/06/2017, 4:36 PM

## 2017-11-06 NOTE — Progress Notes (Signed)
Initial Nutrition Assessment  DOCUMENTATION CODES:  Severe malnutrition in context of acute illness/injury  INTERVENTION:  Boost Breeze po BID, each supplement provides 250 kcal and 9 grams of protein  -Transition To ensure when diet advanced  Will order 30 mL Prostat BID, each supplement provides 100 kcal and 15 grams of protein.  Will add higher protein snacks in between meals when able  Brief cirrhotic diet education with handouts.   NUTRITION DIAGNOSIS:  Severe Malnutrition related to acute complication (pseudo-obstruction) of chronic illness (decompensated cirrhosis) and inability to eat (NPO/CL x 8 days) as evidenced by meeting </= to 50% of estimated kcal/pro needs for >/= to 5 days, moderate fat wasting and severe muscle wasting.   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 Brought to ED 12/28 due to husband returning home to find patient w/ increased confusion, weakness and frequent falls. Also reportedly had poor PO intake.  Worked up for encephalopathy r/t elevated ammonia and admitted for management.   Shortly after admission, patient suspected to have colonic pseudo-obstruction or Ogilvie syndrome which has led to her prolonged admission.    [name] has had extremely minimal intake since admit, she has been NPO/CL since admit, 8 days.   Patient seen up in bed, alert and pleasant. She ate 100% of her CL breakfast tray this morning and says she tolerated it well. Denies any n/v/c. Had had diarrhea  RD asked her about how she was doing nutritionally PTA. She says her husband had been away from home for approximately 3 weeks immediately preceding admission. During this time, she declined significantly.  She developed severe weakness and says she ate quite poorly. She says at times she would go multiple days w/o eating anything. At baseline, she does try to watch her sodium intake. She does not sound to have consumed any nutritional supplements.   She says her UBW is 130 lbs. She had been weighed at 132 lbs during a brief admission on 12/12 (will use for dosing kcals/pro). She has gained a significant amount of weight.  Abdomen is extremely protuberant and notes indicate this is NOT fluid, but rather severe distension r/t Ogilvie syndrome.  At this time, the patient has an excellent appetite. She says she feels famished after going so long w/o intake. She greatly desires diet advancement. SHe is very much agreeable to supplements when diet is advanced.   RD also gave some brief diet education on cirrhosis for when she is discharged. Explained importance of eating 5-6 small meals/day and nighttime supplement. When able, she was agreeable to eating snacks in between meals.  Given wasting and prolonged period w/ minimal intake, she meets criteria for severe malnutrition in acute on chronic illness. Would  also meet criteria in chronic alone.   Physical Exam: Severe lower body muscle wasting. Moderate upper body muscle wasting of clavicular musculature and deltoids. Mild fat wasting of orbitals and underarm reserves.    Labs: Albumin: 2.0, Bgs:85-90, Na: 140 Meds: Mag ox, Nystatin, Senokot S  Recent Labs  Lab 11/03/17 0920 11/05/17 0517 11/06/17 0608  NA 138  138 138 140  K 3.9  3.9 3.6 3.8  CL 110  110 111 110  CO2 23  22 22 23   BUN 12  12 12 10   CREATININE 0.75  0.76 0.62 0.64  CALCIUM 7.7*  7.7* 7.5* 7.9*  MG 1.8 1.7 1.8  PHOS 2.5 3.1 3.0  GLUCOSE 117*  118* 85 90   NUTRITION - FOCUSED PHYSICAL EXAM:   Most Recent Value  Orbital Region  Moderate depletion  Upper Arm Region  Moderate depletion  Thoracic and Lumbar Region  Unable to assess  Buccal Region  Mild depletion   Temple Region  Severe depletion  Clavicle Bone Region  Moderate depletion  Clavicle and Acromion Bone Region  Moderate depletion  Scapular Bone Region  Unable to assess  Dorsal Hand  Severe depletion  Patellar Region  Severe depletion  Anterior Thigh Region  Severe depletion  Posterior Calf Region  Severe depletion  Edema (RD Assessment)  Unable to assess       Diet Order:  Diet clear liquid Room service appropriate? Yes; Fluid consistency: Thin  EDUCATION NEEDS:  Education needs have been addressed  Skin: Abrasions to Arm/Wrist, skin tear to back  Last BM:  1/4  Height:  Ht Readings from Last 1 Encounters:  10/29/17 5' 7"  (1.702 m)   Weight:  Wt Readings from Last 1 Encounters:  11/06/17 156 lb 15.5 oz (71.2 kg)   Wt Readings from Last 10 Encounters:  11/06/17 156 lb 15.5 oz (71.2 kg)  10/13/17 132 lb 11.5 oz (60.2 kg)  09/17/17 135 lb (61.2 kg)  06/27/17 140 lb (63.5 kg)  05/04/17 145 lb (65.8 kg)  04/30/17 145 lb (65.8 kg)   Ideal Body Weight:  61.36 kg  BMI:  Body mass index using dosing weight is 20.8 kg/m.  Estimated Nutritional Needs:  Kcal:  1900-2150 kcals (HBE x1.5-1.7 kcals/kg dw) Protein:  90-102g Pro (1.5-1.7 g/kg dw) Fluid:  Per MD goals.    Megan Roberson RD, LDN, CNSC Clinical Nutrition Pager: [phone] 11/06/2017 10:58 AM

## 2017-11-06 NOTE — Progress Notes (Signed)
[place] TEAM 1 - Stepdown/ICU TEAM  Megan Roberson  ZLD:[id number] DOB: July 31, 1956 DOA: 10/29/2017 PCP: Patient, No Pcp Per    Brief Narrative:  62 y.o. 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   Significant Events: 12/28 admit   Subjective: C/o diffuse aching pain - worse in upper abdom, upper chest, and L shoulder.  Denies sob, n/v, or SSCP.  Reports having had a large bowel movement last night.  States she is tolerating her clear liquids w/o difficulty.   Assessment & Plan:  Ogilvie Syndrome / Ileus  per GI not candidate for decompression at this time - maximized lytes - cont simethicone for comfort - advance to full liquids - very slowly advance diet as tolerated   AMS - multifactorial Due to hepatic encephalopathy + sedating meds for MRI - much improved / back to baseline   Sacral Fracture - Acute/Subacute L-spine MRI noted some mass-effect of the cauda equina - Neurosurgery reports no surgical options available for correction of deficit - patient has fitted with TLSO brace for use w/ ambulation - she is beginning to experience more pain - counseled that pain meds have risk of worsening ileus, but believe pain is valid and will attempt to at least ameliorate it w/ low dose pain med  Ascites last paracentesis 1 week ago > 5 L fluid removed - IR found no ascites earlier this admit - appears to be re-accumulating per exam - follow   Loculated RIGHT pleural effusion No clinical evidence of infection - effusion not large enough or clinically significant to require thoracentesis   ESLD/Cirrhosis of Liver - Hepatic encephalopathy Cont rifaximin - mental status at baseline on current tx   Thrombocytopenia (baseline 30-50K) At baseline  DVT prophylaxis: SCDs Code Status: FULL CODE Family Communication:  spoke w/ husband at bedside   Disposition Plan: stable for transfer to med/surg bed - needs ongiong attention to bowel motility - begin PT/OT - slowly advance diet   Consultants:  GI IR Neurosurgery   Antimicrobials:  Aztreonam 12/28 > 1/1 Levaquin 12/28 > 1/3 Vancomycin 12/28 > 1/1  Objective: Blood pressure 103/68, pulse 63, temperature 98.3 F (36.8 C), temperature source Oral, resp. rate 18, height 5' 7"  (1.702 m), weight 71.2 kg (156 lb 15.5 oz), SpO2 98 %.  Intake/Output Summary (Last 24 hours) at 11/06/2017 1219 Last data filed at 11/06/2017 0900 Gross per 24 hour  Intake 854.67 ml  Output -  Net 854.67 ml   Filed Weights   11/04/17 0506 11/05/17 0552 11/06/17 0344  Weight: 70.5 kg (155 lb 6.8 oz) 70.5 kg (155 lb 6.8 oz) 71.2 kg (156 lb 15.5 oz)    Examination: General: No acute respiratory distress - alert and oriented  Lungs: Clear to auscultation B - no wheezing or crackles  Cardiovascular: RRR w/o M or rub  Abdomen: soft, distended w/ suspected ascites, bowel sounds positive/high pitched, no rebound, nontender Extremities: no edema B LE   CBC: Recent Labs  Lab 11/01/17 0503 11/02/17 0529 11/03/17 0920 11/05/17 0517 11/06/17 0608  WBC 8.3 3.8* 3.9* 4.3 5.0  HGB 10.1* 10.7* 10.0* 10.1* 10.6*  HCT 32.6* 35.4* 32.8* 33.4* 35.1*  MCV 100.0 101.7* 100.6* 96.3 96.2  PLT 52* 35* 32* 29* 34*   Basic Metabolic Panel: Recent Labs  Lab 10/30/17 1834  10/31/17 0522 11/01/17 0503 11/02/17 0529 11/03/17  8938 11/05/17 0517 11/06/17 0608  NA  --    < > 135 138 141 138  138 138 140  K 3.2*  --  3.9 4.7 4.6 3.9  3.9 3.6 3.8  CL  --    < > 105 110 114* 110  110 111 110  CO2  --    < > 24 22 24 23  22 22 23   GLUCOSE  --    < > 159* 125* 84 117*  118* 85 90  BUN  --    < > 10 13 14 12  12 12 10   CREATININE  --    < > 0.60 0.68 0.72 0.75  0.76 0.62 0.64  CALCIUM  --    < > 7.5* 8.0* 7.9* 7.7*  7.7* 7.5* 7.9*  MG 1.9  --  2.0 2.1  --  1.8 1.7 1.8  PHOS 2.8   --   --   --  2.0* 2.5 3.1 3.0   < > = values in this interval not displayed.   GFR: Estimated Creatinine Clearance: 73.6 mL/min (by C-G formula based on SCr of 0.64 mg/dL).  Liver Function Tests: Recent Labs  Lab 11/02/17 0529 11/03/17 0920 11/06/17 0608  AST 86* 89* 62*  ALT 52 47 35  ALKPHOS 257* 242* 268*  BILITOT 1.8* 1.9* 2.7*  PROT 4.8* 4.6* 5.1*  ALBUMIN 1.9* 1.8* 2.0*    Recent Labs  Lab 10/30/17 1414 10/31/17 0522 11/01/17 0503 11/02/17 0529 11/03/17 0920  AMMONIA 45* 46* 43* 13 39*    CBG: Recent Labs  Lab 11/02/17 0307 11/02/17 0814 11/02/17 2354 11/03/17 0331 11/03/17 0815  GLUCAP 87 70 87 91 84    Recent Results (from the past 240 hour(s))  Blood culture (routine x 2)     Status: None   Collection Time: 10/29/17 12:49 PM  Result Value Ref Range Status   Specimen Description BLOOD RIGHT ANTECUBITAL  Final   Special Requests   Final    BOTTLES DRAWN AEROBIC AND ANAEROBIC Blood Culture adequate volume   Culture NO GROWTH 5 DAYS  Final   Report Status 11/03/2017 FINAL  Final  Blood culture (routine x 2)     Status: None   Collection Time: 10/29/17 12:52 PM  Result Value Ref Range Status   Specimen Description BLOOD RIGHT WRIST  Final   Special Requests   Final    BOTTLES DRAWN AEROBIC AND ANAEROBIC Blood Culture adequate volume   Culture NO GROWTH 5 DAYS  Final   Report Status 11/03/2017 FINAL  Final  MRSA PCR Screening     Status: None   Collection Time: 10/29/17  5:32 PM  Result Value Ref Range Status   MRSA by PCR NEGATIVE NEGATIVE Final    Comment:        The GeneXpert MRSA Assay (FDA approved for NASAL specimens only), is one component of a comprehensive MRSA colonization surveillance program. It is not intended to diagnose MRSA infection nor to guide or monitor treatment for MRSA infections.      Scheduled Meds: . feeding supplement  1 Container Oral BID BM  . feeding supplement (PRO-STAT SUGAR FREE 64)  30 mL Oral BID BM    . gabapentin  200 mg Oral TID  . levothyroxine  50 mcg Oral QAC breakfast  . magnesium oxide  200 mg Oral Daily  . nystatin  5 mL Oral QID  . rifaximin  200 mg Oral TID  . senna-docusate  1 tablet  Oral BID  . sertraline  100 mg Oral QHS  . traZODone  50 mg Oral QHS     LOS: 8 days   Cherene Altes, MD [facility] Office  [phone] Pager - Text Page per Amion as per below:  On-Call/Text Page:      [email or web address]      password [email or web address]  If 7PM-7AM, please contact night-coverage [email or web address] Password [email or web address] 11/06/2017, 12:19 PM

## 2017-11-07 LAB — BASIC METABOLIC PANEL
Anion gap: 5 (ref 5–15)
BUN: 13 mg/dL (ref 6–20)
CO2: 22 mmol/L (ref 22–32)
Calcium: 7.9 mg/dL — ABNORMAL LOW (ref 8.9–10.3)
Chloride: 108 mmol/L (ref 101–111)
Creatinine, Ser: 0.56 mg/dL (ref 0.44–1.00)
GFR calc Af Amer: >60 mL/min (ref >60)
GFR calc non Af Amer: >60 mL/min (ref >60)
Glucose, Bld: 96 mg/dL (ref 65–99)
Potassium: 3.4 mmol/L — ABNORMAL LOW (ref 3.5–5.1)
Sodium: 135 mmol/L (ref 135–145)

## 2017-11-07 MED ORDER — POTASSIUM CHLORIDE CRYS ER 20 MEQ PO TBCR
40.0000 meq | EXTENDED_RELEASE_TABLET | Freq: Two times a day (BID) | ORAL | Status: AC
  Administered 2017-11-07 – 2017-11-08 (×3): 40 meq via ORAL
  Filled 2017-11-07 (×3): qty 2

## 2017-11-07 MED ORDER — MAGIC MOUTHWASH W/LIDOCAINE
5.0000 mL | Freq: Four times a day (QID) | ORAL | Status: DC | PRN
  Administered 2017-11-07 – 2017-11-09 (×8): 5 mL via ORAL
  Filled 2017-11-07 (×10): qty 5

## 2017-11-07 NOTE — Clinical Social Work Note (Signed)
Clinical Social Work Assessment  Patient Details  Name: Megan Roberson MRN: [id number] Date of Birth: 1956/06/27  Date of referral:  11/07/17               Reason for consult:  Facility Placement                Permission sought to share information with:  Family Supports Permission granted to share information::  Yes, Verbal Permission Granted  Name::     Doctor, general practice::     Relationship::  Spouse  Contact Information:     Housing/Transportation Living arrangements for the past 2 months:  Single Family Home Source of Information:  Patient Patient Interpreter Needed:  None Criminal Activity/Legal Involvement Pertinent to Current Situation/Hospitalization:  No - Comment as needed Significant Relationships:  Spouse, Siblings Lives with:  Spouse Do you feel safe going back to the place where you live?    Need for family participation in patient care:     Care giving concerns:  Pt is alert and oriented x4. Pt denies any concerns at this time.   Social Worker assessment / plan:  CSW spoke with pt at bedside. Pt lives in [place] with spouse. Pt states she does not want to go to SNF but would rather have [facility]. Pt states her husband would be with her 24/7. CSW will continue to follow in the case SNF is needed at d/c.   Employment status:    Insurance information:  Medicare PT Recommendations:  [place] / Referral to community resources:  [place]  Patient/Family's Response to care:  Pt verbalized understanding of CSW role and expressed appreciation for support. Pt denies any concern regarding pt care at this time.  Patient/Family's Understanding of and Emotional Response to Diagnosis, Current Treatment, and Prognosis:  Pt understanding and realistic regarding physical limitations. Pt not agreeable to SNF placement at d/c, at this time and prefers d/c home with home health. Pt's responses emotionally appropriate during conversation with CSW. Pt  denies any concern regarding treatment plan at this time. CSW will continue to provide support and facilitate d/c needs.   Emotional Assessment Appearance:  Appears younger than stated age Attitude/Demeanor/Rapport:  Engaged Affect (typically observed):  Appropriate, Calm, Happy Orientation:  Oriented to Self, Oriented to Place, Oriented to  Time, Oriented to Situation Alcohol / Substance use:  Not Applicable Psych involvement (Current and /or in the community):  No (Comment)  Discharge Needs  Concerns to be addressed:  Denies Needs/Concerns at this time, Patient refuses services Readmission within the last 30 days:  Yes Current discharge risk:  Dependent with Mobility, Physical Impairment Barriers to Discharge:  Continued Medical Work up   [employer], LCSW 11/07/2017, 3:37 PM

## 2017-11-07 NOTE — Evaluation (Signed)
Occupational Therapy Evaluation Patient Details Name: Genell Thede MRN: [id number] DOB: [birth year] Today's Date: 11/07/2017    History of Present Illness Pt is a 62 year old white female who was admitted with confusion, falls, back pain, Ogilvie syndrome, liver failure, etc.  She was worked up with a CT of the abdomen and pelvis which demonstrated bowel distention, enteric and pericolonic edema, pneumonia, liver sclerosis, old fractures at T11 and L1, cholelithiasis, nephrolithiasis lumbar transverse process fractures and a S2 fracture. No surgical treatment per neurosurg.   Clinical Impression   PTA, pt was living with her husband who was assistance with BADLs due to significant pain in pt's abdomen. Pt currently requiring Min Guard A for grooming at EOB, Max A for LB ADLs and Mod-Max A for functional transfers. Pt transferring to recliner, but unable to maintain sitting upright due to pain in abdomen and R hip and requested to return to bed. Pt would benefit form further acute OT to facilitate safe dc. Recommend dc to SNF for further OT to optimize safety and independence before returning home.     Follow Up Recommendations  SNF    Equipment Recommendations  Other (comment)(Defer to next venue)    Recommendations for Other Services PT consult     Precautions / Restrictions Precautions Precautions: Fall;Back Precaution Comments: Reviewed back precautions and pt verbalized understanding Required Braces or Orthoses: Spinal Brace Spinal Brace: Thoracolumbosacral orthotic Restrictions Weight Bearing Restrictions: No      Mobility Bed Mobility Overal bed mobility: Needs Assistance Bed Mobility: Sit to Supine;Rolling;Sidelying to Sit Rolling: Min assist Sidelying to sit: Mod assist;HOB elevated   Sit to supine: Mod assist   General bed mobility comments: increased time and effort, use of bed rails, assist to elevate trunk and use of bed pads to adjust pt's hips at EOB. Educated  pt on log roll technique to reduce pain and adhere to back precautions  Transfers Overall transfer level: Needs assistance Equipment used: Rolling walker (2 wheeled) Transfers: Sit to/from [facility] Sit to Stand: Mod assist Stand pivot transfers: Mod assist;Max assist       General transfer comment: Mod A to power into standing. Progressed to Max A due to fatigue.    Balance Overall balance assessment: Needs assistance;History of Falls Sitting-balance support: Feet supported;Bilateral upper extremity supported Sitting balance-Leahy Scale: Fair Sitting balance - Comments: Able to maintain during grooming   Standing balance support: Bilateral upper extremity supported;During functional activity Standing balance-Leahy Scale: Poor Standing balance comment: Reliant on UE support and physical A                           ADL either performed or assessed with clinical judgement   ADL Overall ADL's : Needs assistance/impaired Eating/Feeding: Set up;Bed level   Grooming: Brushing hair;Min guard;Sitting(EOB) Grooming Details (indicate cue type and reason): Pt able to maintain sitting balance while brushing her hair at EOB with Min guard A for safety Upper Body Bathing: Minimal assistance;Sitting   Lower Body Bathing: Maximal assistance;Sit to/from stand;+2 for safety/equipment   Upper Body Dressing : Minimal assistance;Maximal assistance;Sitting Upper Body Dressing Details (indicate cue type and reason): Min A to don gown like jacket and then Max A for managing brace Lower Body Dressing: Maximal assistance;+2 for safety/equipment;Sit to/from stand Lower Body Dressing Details (indicate cue type and reason): Max A to adhere to back precautions. donned socks Toilet Transfer: Moderate assistance;Maximal assistance;Stand-pivot;RW(Simulated to recliner)  Functional mobility during ADLs: Moderate assistance;Maximal assistance;Rolling walker(Stand pivot  only) General ADL Comments: Pt dmeonstrating decreased functional performance and is limited by fatigue and pain.      Vision Baseline Vision/History: Wears glasses(reports double vision occasionally at baseline) Wears Glasses: Reading only Patient Visual Report: Blurring of vision       Perception     Praxis      Pertinent Vitals/Pain Pain Assessment: Faces Faces Pain Scale: Hurts whole lot Pain Location: abdomen Pain Descriptors / Indicators: Sore;Grimacing;Guarding;Moaning Pain Intervention(s): Monitored during session;Limited activity within patient's tolerance;Repositioned     Hand Dominance Right   Extremity/Trunk Assessment Upper Extremity Assessment Upper Extremity Assessment: LUE deficits/detail LUE Deficits / Details: passive and active ROM limitations at elbow (hx of prior sx); unable to achieve full elbow extension   Lower Extremity Assessment Lower Extremity Assessment: Defer to PT evaluation;Generalized weakness   Cervical / Trunk Assessment Cervical / Trunk Assessment: Normal   Communication Communication Communication: No difficulties   Cognition Arousal/Alertness: Awake/alert Behavior During Therapy: WFL for tasks assessed/performed Overall Cognitive Status: Within Functional Limits for tasks assessed                                 General Comments: Motivated to participater in therapy and return to independence   General Comments  Educated pt on benefits of OOB and sitting up for meals    Exercises Exercises: General Lower Extremity General Exercises - Lower Extremity Ankle Circles/Pumps: Both;10 reps;Supine   Shoulder Instructions      Home Living Family/patient expects to be discharged to:: Private residence Living Arrangements: Spouse/significant other Available Help at Discharge: Family Type of Home: House Home Access: Stairs to enter Technical brewer of Steps: 1   Home Layout: One level     Bathroom  Shower/Tub: International aid/development worker Accessibility: Yes   Home Equipment: Cane - single point;Shower seat;Bedside commode;Wheelchair - manual   Additional Comments: Patient states her husband is in jail, if he does not get out soon, will stay with her sister      Prior Functioning/Environment Level of Independence: Needs assistance  Gait / Transfers Assistance Needed: independent ADL's / Homemaking Assistance Needed: husband assists with bathing, dressing, and toileting   Comments: uses Hemi-walker at home        OT Problem List: Decreased strength;Decreased range of motion;Decreased activity tolerance;Impaired balance (sitting and/or standing);Decreased safety awareness;Decreased knowledge of use of DME or AE;Decreased knowledge of precautions;Pain      OT Treatment/Interventions: Self-care/ADL training;Therapeutic exercise;Energy conservation;DME and/or AE instruction;Therapeutic activities;Patient/family education    OT Goals(Current goals can be found in the care plan section) Acute Rehab OT Goals Patient Stated Goal: decrease pain OT Goal Formulation: With patient Time For Goal Achievement: 11/21/17 Potential to Achieve Goals: Good ADL Goals Pt Will Perform Upper Body Dressing: with min assist;sitting;with caregiver independent in assisting(Managing brace) Pt Will Perform Lower Body Dressing: with min assist;sit to/from stand;with adaptive equipment Pt Will Transfer to Toilet: with min assist;bedside commode;ambulating Pt Will Perform Toileting - Clothing Manipulation and hygiene: with min assist;sit to/from stand Additional ADL Goal #1: Pt will independently verbalize 3/3 back precautions  OT Frequency: Min 2X/week   Barriers to D/C:            Co-evaluation              AM-PAC PT "6 Clicks" Daily Activity     Outcome Measure Help from  another person eating meals?: None Help from another person taking care of personal grooming?: A Little Help from  another person toileting, which includes using toliet, bedpan, or urinal?: A Lot Help from another person bathing (including washing, rinsing, drying)?: A Lot Help from another person to put on and taking off regular upper body clothing?: A Lot Help from another person to put on and taking off regular lower body clothing?: A Lot 6 Click Score: 15   End of Session Equipment Utilized During Treatment: Gait belt;Back brace;Rolling walker Nurse Communication: Mobility status  Activity Tolerance: Patient limited by fatigue;Patient limited by pain Patient left: in bed;with call bell/phone within reach;with bed alarm set  OT Visit Diagnosis: Unsteadiness on feet (R26.81);Other abnormalities of gait and mobility (R26.89);Muscle weakness (generalized) (M62.81);History of falling (Z91.81);Pain Pain - part of body: (Abdomen)                Time: 1901-2224 OT Time Calculation (min): 33 min Charges:  OT General Charges $OT Visit: 1 Visit OT Evaluation $OT Eval Moderate Complexity: 1 Mod OT Treatments $Self Care/Home Management : 8-22 mins G-Codes:     Cheralyn Oliver MSOT, OTR/L Acute Rehab Pager: [phone] Office: [place] 11/07/2017, 1:00 PM

## 2017-11-07 NOTE — Clinical Social Work Note (Signed)
Pt refuses SNF and wants to d/c home with home health. Pt states her husband will be with her 24/7. Pt's husband at bedside and confirms. Clinical Social Worker will sign off for now as social work intervention is no longer needed. Please consult [place] again if new need arises.  [place], [place]

## 2017-11-07 NOTE — Progress Notes (Signed)
[facility] TEAM 1 - Stepdown/ICU TEAM  Megan Roberson  YYQ:[id number] DOB: Dec 13, 1955 DOA: 10/29/2017 PCP: Patient, No Pcp Per    Brief Narrative:  62 y.o. 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   Significant Events: 12/28 admit   Subjective: Pt continues to have daily bowel movements.  She denies N/V.  She is ready to advance her diet futher.  She denies cp or sob, but does report ongoing back, upper abdom, and pelvic pain.    Assessment & Plan:  Ogilvie Syndrome / Ileus  per GI not candidate for decompression - maximized lytes - cont simethicone for comfort - advance to reg diet   AMS - multifactorial Due to hepatic encephalopathy + sedating meds for MRI - back to baseline   Sacral Fracture - Acute/Subacute L-spine MRI noted some mass-effect of the cauda equina - Neurosurgery reports no surgical options available for correction of deficit - patient has fitted with TLSO brace for use w/ ambulation - she is beginning to experience more pain - counseled that pain meds have risk of worsening ileus, but believe pain is valid and will attempt to at least ameliorate it w/ low dose pain med  Ascites last paracentesis 1 week ago > 5 L fluid removed - IR found no ascites earlier this admit - appears to be re-accumulating per exam but not presently problematic   Loculated RIGHT pleural effusion No clinical evidence of infection - effusion not large enough or clinically significant to require thoracentesis   ESLD/Cirrhosis of Liver - Hepatic encephalopathy Cont rifaximin - mental status at baseline on current tx   Thrombocytopenia (baseline 30-50K) At baseline  DVT prophylaxis: SCDs Code Status: FULL CODE Family Communication: spoke w/ husband at bedside   Disposition Plan: possible d/c to SNF in 24-48hrs if  tolerates regular diet    Consultants:  GI IR Neurosurgery   Antimicrobials:  Aztreonam 12/28 > 1/1 Levaquin 12/28 > 1/3 Vancomycin 12/28 > 1/1  Objective: Blood pressure 124/72, pulse 64, temperature 98.2 F (36.8 C), temperature source Oral, resp. rate 16, height 5' 7"  (1.702 m), weight 71.2 kg (156 lb 15.5 oz), SpO2 97 %.  Intake/Output Summary (Last 24 hours) at 11/07/2017 1421 Last data filed at 11/07/2017 1000 Gross per 24 hour  Intake 240 ml  Output -  Net 240 ml   Filed Weights   11/04/17 0506 11/05/17 0552 11/06/17 0344  Weight: 70.5 kg (155 lb 6.8 oz) 70.5 kg (155 lb 6.8 oz) 71.2 kg (156 lb 15.5 oz)    Examination: General: No acute respiratory distress  Lungs: Clear to auscultation B  Cardiovascular: RRR Abdomen: soft, distended w/ suspected ascites, bowel sounds positive/high pitched, no rebound Extremities: no signif edema B LE   CBC: Recent Labs  Lab 11/01/17 0503 11/02/17 0529 11/03/17 0920 11/05/17 0517 11/06/17 0608  WBC 8.3 3.8* 3.9* 4.3 5.0  HGB 10.1* 10.7* 10.0* 10.1* 10.6*  HCT 32.6* 35.4* 32.8* 33.4* 35.1*  MCV 100.0 101.7* 100.6* 96.3 96.2  PLT 52* 35* 32* 29* 34*   Basic Metabolic Panel: Recent Labs  Lab 11/01/17 0503 11/02/17 0529 11/03/17 0920 11/05/17 0517 11/06/17 0608 11/07/17 0401  NA 138 141 138  138 138 140 135  K 4.7 4.6 3.9  3.9 3.6 3.8 3.4*  CL 110 114* 110  110 111 110 108  CO2 22 24 23  22 22 23 22   GLUCOSE 125* 84 117*  118* 85 90 96  BUN 13 14 12  12 12 10 13   CREATININE 0.68 0.72 0.75  0.76 0.62 0.64 0.56  CALCIUM 8.0* 7.9* 7.7*  7.7* 7.5* 7.9* 7.9*  MG 2.1  --  1.8 1.7 1.8  --   PHOS  --  2.0* 2.5 3.1 3.0  --    GFR: Estimated Creatinine Clearance: 73.6 mL/min (by C-G formula based on SCr of 0.56 mg/dL).  Liver Function Tests: Recent Labs  Lab 11/02/17 0529 11/03/17 0920 11/06/17 0608  AST 86* 89* 62*  ALT 52 47 35  ALKPHOS 257* 242* 268*  BILITOT 1.8* 1.9* 2.7*  PROT 4.8* 4.6* 5.1*    ALBUMIN 1.9* 1.8* 2.0*    Recent Labs  Lab 11/01/17 0503 11/02/17 0529 11/03/17 0920  AMMONIA 43* 13 39*    CBG: Recent Labs  Lab 11/02/17 0307 11/02/17 0814 11/02/17 2354 11/03/17 0331 11/03/17 0815  GLUCAP 87 70 87 91 84    Recent Results (from the past 240 hour(s))  Blood culture (routine x 2)     Status: None   Collection Time: 10/29/17 12:49 PM  Result Value Ref Range Status   Specimen Description BLOOD RIGHT ANTECUBITAL  Final   Special Requests   Final    BOTTLES DRAWN AEROBIC AND ANAEROBIC Blood Culture adequate volume   Culture NO GROWTH 5 DAYS  Final   Report Status 11/03/2017 FINAL  Final  Blood culture (routine x 2)     Status: None   Collection Time: 10/29/17 12:52 PM  Result Value Ref Range Status   Specimen Description BLOOD RIGHT WRIST  Final   Special Requests   Final    BOTTLES DRAWN AEROBIC AND ANAEROBIC Blood Culture adequate volume   Culture NO GROWTH 5 DAYS  Final   Report Status 11/03/2017 FINAL  Final  MRSA PCR Screening     Status: None   Collection Time: 10/29/17  5:32 PM  Result Value Ref Range Status   MRSA by PCR NEGATIVE NEGATIVE Final    Comment:        The GeneXpert MRSA Assay (FDA approved for NASAL specimens only), is one component of a comprehensive MRSA colonization surveillance program. It is not intended to diagnose MRSA infection nor to guide or monitor treatment for MRSA infections.      Scheduled Meds: . feeding supplement (ENSURE ENLIVE)  237 mL Oral BID BM  . feeding supplement (PRO-STAT SUGAR FREE 64)  30 mL Oral BID  . gabapentin  200 mg Oral TID  . levothyroxine  50 mcg Oral QAC breakfast  . magnesium oxide  200 mg Oral Daily  . rifaximin  200 mg Oral TID  . senna-docusate  1 tablet Oral BID  . sertraline  100 mg Oral QHS  . traZODone  50 mg Oral QHS     LOS: 9 days   Cherene Altes, MD [facility] Office  [phone] Pager - Text Page per Amion as per below:  On-Call/Text Page:       [email or web address]      password [email or web address]  If 7PM-7AM, please contact night-coverage [email or web address] Password [email or web address] 11/07/2017, 2:21 PM

## 2017-11-08 LAB — BASIC METABOLIC PANEL
Anion gap: 6 (ref 5–15)
BUN: 14 mg/dL (ref 6–20)
CO2: 22 mmol/L (ref 22–32)
Calcium: 7.8 mg/dL — ABNORMAL LOW (ref 8.9–10.3)
Chloride: 109 mmol/L (ref 101–111)
Creatinine, Ser: 0.55 mg/dL (ref 0.44–1.00)
GFR calc Af Amer: >60 mL/min (ref >60)
GFR calc non Af Amer: >60 mL/min (ref >60)
Glucose, Bld: 93 mg/dL (ref 65–99)
Potassium: 4.4 mmol/L (ref 3.5–5.1)
Sodium: 137 mmol/L (ref 135–145)

## 2017-11-08 MED ORDER — LACTULOSE 10 GM/15ML PO SOLN
10.0000 g | Freq: Three times a day (TID) | ORAL | Status: DC
  Administered 2017-11-08 – 2017-11-09 (×4): 10 g via ORAL
  Filled 2017-11-08 (×4): qty 15

## 2017-11-08 MED ORDER — POTASSIUM CHLORIDE CRYS ER 20 MEQ PO TBCR
40.0000 meq | EXTENDED_RELEASE_TABLET | Freq: Every day | ORAL | Status: DC
  Administered 2017-11-08 – 2017-11-09 (×2): 40 meq via ORAL
  Filled 2017-11-08: qty 2

## 2017-11-08 NOTE — Progress Notes (Signed)
[facility] TEAM 1 - Stepdown/ICU TEAM  Chelesea Weiand  WCH:[id number] DOB: Mar 24, 1956 DOA: 10/29/2017 PCP: Patient, No Pcp Per    Brief Narrative:  62 y.o. 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   Significant Events: 12/28 admit   Subjective: No bowel movement in over 24hrs now.  Less bowel sounds appreciated on exam.  Denies n/v at present, and thus far has been tolerating her regular diet.  Denies CP or SOB.  Is alert and oriented.    Assessment & Plan:  Ogilvie Syndrome / Ileus  per GI not candidate for decompression - maximized lytes - cont simethicone for comfort - advanced to reg diet - increase bowel stimulation today - f/u KUB in AM   AMS - multifactorial Due to hepatic encephalopathy + sedating meds for MRI - back to baseline   Sacral Fracture - Acute/Subacute L-spine MRI noted some mass-effect of the cauda equina - Neurosurgery reports no surgical options available for correction of deficit - patient has fitted with TLSO brace for use w/ ambulation - she is beginning to experience more pain - counseled that pain meds have risk of worsening ileus, but believe pain is valid and will attempt to at least ameliorate it w/ low dose pain med - pt offered PT/OT in a SNF setting but she has declined   Ascites last paracentesis 1 week ago > 5 L fluid removed - IR found no ascites earlier this admit - appears to be re-accumulating per exam but not presently problematic clinically   Loculated RIGHT pleural effusion No clinical evidence of infection - effusion not large enough or clinically significant to require thoracentesis   ESLD/Cirrhosis of Liver - Hepatic encephalopathy Cont rifaximin - mental status at baseline on current tx   Thrombocytopenia (baseline 30-50K) At baseline  DVT prophylaxis: SCDs Code  Status: FULL CODE Family Communication: no family present today   Disposition Plan: d/c when having consistent bowel movements/ileus clinically resolved     Consultants:  GI IR Neurosurgery   Antimicrobials:  Aztreonam 12/28 > 1/1 Levaquin 12/28 > 1/3 Vancomycin 12/28 > 1/1  Objective: Blood pressure 109/65, pulse 71, temperature 99.8 F (37.7 C), temperature source Oral, resp. rate 16, height 5' 7"  (1.702 m), weight 71.2 kg (156 lb 15.5 oz), SpO2 96 %.  Intake/Output Summary (Last 24 hours) at 11/08/2017 1143 Last data filed at 11/08/2017 1100 Gross per 24 hour  Intake 600 ml  Output 300 ml  Net 300 ml   Filed Weights   11/04/17 0506 11/05/17 0552 11/06/17 0344  Weight: 70.5 kg (155 lb 6.8 oz) 70.5 kg (155 lb 6.8 oz) 71.2 kg (156 lb 15.5 oz)    Examination: General: No acute respiratory distress - alert and conversant Lungs: CTA B - no wheezing  Cardiovascular: RRR - no M or rub  Abdomen: soft, distended bit soft, bowel sounds diminished, no rebound - moderate ascites likely  Extremities: no signif edema B LE   CBC: Recent Labs  Lab 11/02/17 0529 11/03/17 0920 11/05/17 0517 11/06/17 0608  WBC 3.8* 3.9* 4.3 5.0  HGB 10.7* 10.0* 10.1* 10.6*  HCT 35.4* 32.8* 33.4* 35.1*  MCV 101.7* 100.6* 96.3 96.2  PLT 35* 32* 29* 34*   Basic Metabolic Panel: Recent Labs  Lab 11/02/17 0529 11/03/17 0920 11/05/17 0517 11/06/17 0608 11/07/17 0401 11/08/17 0907  NA 141 138  138 138 140 135 137  K 4.6 3.9  3.9 3.6 3.8 3.4* 4.4  CL 114* 110  110 111 110 108 109  CO2 24 23  22 22 23 22 22   GLUCOSE 84 117*  118* 85 90 96 93  BUN 14 12  12 12 10 13 14   CREATININE 0.72 0.75  0.76 0.62 0.64 0.56 0.55  CALCIUM 7.9* 7.7*  7.7* 7.5* 7.9* 7.9* 7.8*  MG  --  1.8 1.7 1.8  --   --   PHOS 2.0* 2.5 3.1 3.0  --   --    GFR: Estimated Creatinine Clearance: 73.6 mL/min (by C-G formula based on SCr of 0.55 mg/dL).  Liver Function Tests: Recent Labs  Lab 11/02/17 0529  11/03/17 0920 11/06/17 0608  AST 86* 89* 62*  ALT 52 47 35  ALKPHOS 257* 242* 268*  BILITOT 1.8* 1.9* 2.7*  PROT 4.8* 4.6* 5.1*  ALBUMIN 1.9* 1.8* 2.0*    Recent Labs  Lab 11/02/17 0529 11/03/17 0920  AMMONIA 13 39*    CBG: Recent Labs  Lab 11/02/17 0307 11/02/17 0814 11/02/17 2354 11/03/17 0331 11/03/17 0815  GLUCAP 87 70 87 91 84    Recent Results (from the past 240 hour(s))  Blood culture (routine x 2)     Status: None   Collection Time: 10/29/17 12:49 PM  Result Value Ref Range Status   Specimen Description BLOOD RIGHT ANTECUBITAL  Final   Special Requests   Final    BOTTLES DRAWN AEROBIC AND ANAEROBIC Blood Culture adequate volume   Culture NO GROWTH 5 DAYS  Final   Report Status 11/03/2017 FINAL  Final  Blood culture (routine x 2)     Status: None   Collection Time: 10/29/17 12:52 PM  Result Value Ref Range Status   Specimen Description BLOOD RIGHT WRIST  Final   Special Requests   Final    BOTTLES DRAWN AEROBIC AND ANAEROBIC Blood Culture adequate volume   Culture NO GROWTH 5 DAYS  Final   Report Status 11/03/2017 FINAL  Final  MRSA PCR Screening     Status: None   Collection Time: 10/29/17  5:32 PM  Result Value Ref Range Status   MRSA by PCR NEGATIVE NEGATIVE Final    Comment:        The GeneXpert MRSA Assay (FDA approved for NASAL specimens only), is one component of a comprehensive MRSA colonization surveillance program. It is not intended to diagnose MRSA infection nor to guide or monitor treatment for MRSA infections.      Scheduled Meds: . feeding supplement (ENSURE ENLIVE)  237 mL Oral BID BM  . feeding supplement (PRO-STAT SUGAR FREE 64)  30 mL Oral BID  . gabapentin  200 mg Oral TID  . levothyroxine  50 mcg Oral QAC breakfast  . magnesium oxide  200 mg Oral Daily  . rifaximin  200 mg Oral TID  . senna-docusate  1 tablet Oral BID  . sertraline  100 mg Oral QHS  . traZODone  50 mg Oral QHS     LOS: 10 days   Cherene Altes, MD [facility] Office  [phone] Pager - Text Page per Amion as per below:  On-Call/Text Page:      [email or web address]      password [email or web address]  If 7PM-7AM, please contact night-coverage [email or web address] Password [facility] 11/08/2017, 11:43 AM

## 2017-11-08 NOTE — Plan of Care (Signed)
  Progressing Education: Knowledge of General Education information will improve 11/08/2017 1052 - Progressing by Rance Muir, RN Nutrition: Adequate nutrition will be maintained 11/08/2017 1052 - Progressing by Rance Muir, RN Coping: Level of anxiety will decrease 11/08/2017 1052 - Progressing by Rance Muir, RN Pain Managment: General experience of comfort will improve 11/08/2017 1052 - Progressing by Rance Muir, RN Skin Integrity: Risk for impaired skin integrity will decrease 11/08/2017 1052 - Progressing by Rance Muir, RN

## 2017-11-08 NOTE — Care Management Note (Signed)
Case Management Note  Patient Details  Name: Megan Roberson MRN: [id number] Date of Birth: [birth year]  Subjective/Objective:  62 yr old female admitted with hepatic encephalopathy.                  Action/Plan: Case manager spoke with patient concerning discharge plan. Patient was offered choice for home health agency. She was previously setup with [facility] [place]. Case manager contacted Sharmon Revere, [facility] Transition Coordinator with referral. Patient has all necessary DME, says she will have family support at discharge.    Expected Discharge Date:    11/09/17              Expected Discharge Plan:  [place]  In-House Referral:  NA  Discharge planning Services  CM Consult  Post Acute Care Choice:  Home Health Choice offered to:  Patient  DME Arranged:  N/A DME Agency:  NA  HH Arranged:  PT HH Agency:  [facility] [place]  Status of Service:  Completed, signed off  If discussed at [place] of Stay Meetings, dates discussed:    Additional Comments:  Ninfa Meeker, RN 11/08/2017, 3:41 PM

## 2017-11-08 NOTE — Progress Notes (Signed)
Physical Therapy Treatment Patient Details Name: Megan Roberson MRN: [id number] DOB: 01/09/1956 Today's Date: 11/08/2017    History of Present Illness Pt is a 62 year old white female who was admitted with confusion, falls, back pain, Ogilvie syndrome, liver failure, etc.  She was worked up with a CT of the abdomen and pelvis which demonstrated bowel distention, enteric and pericolonic edema, pneumonia, liver sclerosis, old fractures at T11 and L1, cholelithiasis, nephrolithiasis lumbar transverse process fractures and a S2 fracture. No surgical treatment per neurosurg.    PT Comments    Pt progressing towards physical therapy goals. Pt somewhat self-limiting at times however overall demonstrated a good rehab effort. Anticipate as her confidence increases she will progress quickly. Tolerance for functional activity remains low, and feel continued rehab services at d/c would be appropriate to maximize functional independence.   Follow Up Recommendations  HHPT;Supervision/Assistance - 24 hour     Equipment Recommendations  None recommended by PT    Recommendations for Other Services       Precautions / Restrictions Precautions Precautions: Fall;Back Precaution Comments: Reviewed back precautions  Required Braces or Orthoses: Spinal Brace Spinal Brace: Thoracolumbosacral orthotic Restrictions Weight Bearing Restrictions: No    Mobility  Bed Mobility Overal bed mobility: Needs Assistance Bed Mobility: Rolling;Sidelying to Sit Rolling: Supervision Sidelying to sit: HOB elevated;Min guard       General bed mobility comments: Increased time more due to anticipation of pain vs actual pain. Pt did not require assistance to transition fully to EOB.   Transfers Overall transfer level: Needs assistance Equipment used: Rolling walker (2 wheeled) Transfers: Sit to/from [facility] Sit to Stand: Mod assist         General transfer comment: VC's for hand placement  on seated surface for safety. Pt required mod assist to power-up to full stand from a raised EOB. Increased assist (almost max) required for stand from low recliner height.   Ambulation/Gait Ambulation/Gait assistance: Min guard Ambulation Distance (Feet): 20 Feet Assistive device: Rolling walker (2 wheeled) Gait Pattern/deviations: Decreased step length - right;Decreased step length - left;Decreased stride length Gait velocity: Decreased Gait velocity interpretation: Below normal speed for age/gender General Gait Details: Slow but generally steady with the RW for support. Pt ambulated in room only with chair follow.    Stairs            Wheelchair Mobility    Modified Rankin (Stroke Patients Only)       Balance Overall balance assessment: Needs assistance;History of Falls Sitting-balance support: Feet supported;Bilateral upper extremity supported Sitting balance-Leahy Scale: Fair Sitting balance - Comments: Able to maintain during grooming Postural control: Posterior lean Standing balance support: Bilateral upper extremity supported;During functional activity Standing balance-Leahy Scale: Poor Standing balance comment: Reliant on UE support and physical A                            Cognition Arousal/Alertness: Awake/alert Behavior During Therapy: WFL for tasks assessed/performed Overall Cognitive Status: Within Functional Limits for tasks assessed                                 General Comments: Motivated to participater in therapy and return to independence      Exercises Other Exercises Other Exercises: Pt was instructed in bilateral gastroc stretches with sheet. 20 second hold x3 each side    General Comments  Pertinent Vitals/Pain Pain Assessment: Faces Faces Pain Scale: Hurts whole lot Pain Location: abdomen Pain Descriptors / Indicators: Sore;Grimacing;Guarding;Moaning Pain Intervention(s): Monitored during session     Home Living                      Prior Function            PT Goals (current goals can now be found in the care plan section) Acute Rehab PT Goals Patient Stated Goal: decrease pain PT Goal Formulation: With patient Time For Goal Achievement: 11/20/17 Potential to Achieve Goals: Good Progress towards PT goals: Progressing toward goals    Frequency    Min 3X/week      PT Plan Discharge plan needs to be updated    Co-evaluation              AM-PAC PT "6 Clicks" Daily Activity  Outcome Measure  Difficulty turning over in bed (including adjusting bedclothes, sheets and blankets)?: Unable Difficulty moving from lying on back to sitting on the side of the bed? : Unable Difficulty sitting down on and standing up from a chair with arms (e.g., wheelchair, bedside commode, etc,.)?: Unable Help needed moving to and from a bed to chair (including a wheelchair)?: A Little Help needed walking in hospital room?: A Little Help needed climbing 3-5 steps with a railing? : A Lot 6 Click Score: 11    End of Session Equipment Utilized During Treatment: Gait belt Activity Tolerance: Patient limited by pain;Patient limited by fatigue Patient left: in chair;with call bell/phone within reach Nurse Communication: Mobility status PT Visit Diagnosis: Other abnormalities of gait and mobility (R26.89)     Time: 5170-0174 PT Time Calculation (min) (ACUTE ONLY): 31 min  Charges:  $Gait Training: 23-37 mins                    G Codes:       Rolinda Roan, PT, DPT Acute Rehabilitation Services Pager: [phone]    Thelma Comp 11/08/2017, 12:43 PM

## 2017-11-09 ENCOUNTER — Inpatient Hospital Stay (HOSPITAL_COMMUNITY): Payer: Medicare Other

## 2017-11-09 DIAGNOSIS — E43 Unspecified severe protein-calorie malnutrition: Secondary | ICD-10-CM

## 2017-11-09 LAB — PHOSPHORUS: Phosphorus: 3.3 mg/dL (ref 2.5–4.6)

## 2017-11-09 LAB — CBC
HCT: 33.7 % — ABNORMAL LOW (ref 36.0–46.0)
Hemoglobin: 10.4 g/dL — ABNORMAL LOW (ref 12.0–15.0)
MCH: 30.6 pg (ref 26.0–34.0)
MCHC: 30.9 g/dL (ref 30.0–36.0)
MCV: 99.1 fL (ref 78.0–100.0)
Platelets: 38 10*3/uL — ABNORMAL LOW (ref 150–400)
RBC: 3.4 MIL/uL — ABNORMAL LOW (ref 3.87–5.11)
RDW: 19.3 % — ABNORMAL HIGH (ref 11.5–15.5)
WBC: 4.3 10*3/uL (ref 4.0–10.5)

## 2017-11-09 LAB — BASIC METABOLIC PANEL
Anion gap: 6 (ref 5–15)
BUN: 13 mg/dL (ref 6–20)
CO2: 26 mmol/L (ref 22–32)
Calcium: 7.9 mg/dL — ABNORMAL LOW (ref 8.9–10.3)
Chloride: 106 mmol/L (ref 101–111)
Creatinine, Ser: 0.46 mg/dL (ref 0.44–1.00)
GFR calc Af Amer: >60 mL/min (ref >60)
GFR calc non Af Amer: >60 mL/min (ref >60)
Glucose, Bld: 84 mg/dL (ref 65–99)
Potassium: 4.2 mmol/L (ref 3.5–5.1)
Sodium: 138 mmol/L (ref 135–145)

## 2017-11-09 LAB — MAGNESIUM: Magnesium: 1.8 mg/dL (ref 1.7–2.4)

## 2017-11-09 MED ORDER — CHLORHEXIDINE GLUCONATE 0.12 % MT SOLN
OROMUCOSAL | Status: AC
  Filled 2017-11-09: qty 15

## 2017-11-09 MED ORDER — RIFAXIMIN 550 MG PO TABS
550.0000 mg | ORAL_TABLET | Freq: Two times a day (BID) | ORAL | Status: DC
  Filled 2017-11-09: qty 1

## 2017-11-09 MED ORDER — MAGIC MOUTHWASH W/LIDOCAINE: 5.0000 mL | Freq: Four times a day (QID) | ORAL | 0 refills | Status: DC | PRN

## 2017-11-09 MED ORDER — LACTULOSE 10 GM/15ML PO SOLN: 10.0000 g | Freq: Three times a day (TID) | ORAL | 0 refills | Status: DC

## 2017-11-09 MED ORDER — GABAPENTIN 100 MG PO CAPS: 200.0000 mg | ORAL_CAPSULE | Freq: Three times a day (TID) | ORAL | 0 refills | Status: DC

## 2017-11-09 MED ORDER — RIFAXIMIN 550 MG PO TABS: 550.0000 mg | ORAL_TABLET | Freq: Two times a day (BID) | ORAL | 0 refills | Status: DC

## 2017-11-09 MED ORDER — POTASSIUM CHLORIDE CRYS ER 20 MEQ PO TBCR: 20.0000 meq | EXTENDED_RELEASE_TABLET | Freq: Every day | ORAL | Status: DC

## 2017-11-09 MED ORDER — IBUPROFEN 200 MG PO TABS: 200.0000 mg | ORAL_TABLET | Freq: Four times a day (QID) | ORAL | 0 refills | Status: DC | PRN

## 2017-11-09 MED ORDER — POTASSIUM CHLORIDE CRYS ER 20 MEQ PO TBCR: 20.0000 meq | EXTENDED_RELEASE_TABLET | Freq: Every day | ORAL | 0 refills | Status: DC

## 2017-11-09 MED ORDER — OXYCODONE HCL 5 MG PO TABS: 5.0000 mg | ORAL_TABLET | Freq: Four times a day (QID) | ORAL | 0 refills | Status: DC | PRN

## 2017-11-09 MED ORDER — SENNOSIDES-DOCUSATE SODIUM 8.6-50 MG PO TABS: 1.0000 | ORAL_TABLET | Freq: Two times a day (BID) | ORAL | Status: DC

## 2017-11-09 NOTE — Progress Notes (Signed)
Patient ID: Megan Roberson, female   DOB: 11/15/1955, 62 y.o.   MRN: [id number]  This NP visited patient at the bedside as a follow up for palliative medicine needs and emotional support.  No family at bedside.   Patient is alert and oriented and excited about anticipated discharge home,  currently she denies pain or discomfort  Continued conversation  regarding diagnosis,  goals of care and anticipated care needs.  Patient tells me that she has strong support from both her sister and her husband.  Patient is open to all offered and available medical interventions to prolong life and she continues to hope ultimately for a liver transplant  We discussed personal responsibility for health and wellness and discussed with patient the importance of continued conversation with her family and their  medical providers regarding overall plan of care and treatment options,  ensuring decisions are within the context of the patients values and GOCs.  Questions and concerns addressed  Time in 0910       Time out  0940    Total time spent on the unit was 30 minutes  Greater than 50% of the time was spent in counseling and coordination of care  PMT will continue to support holistically.  Wadie Lessen NP  Palliative Medicine Team Team Phone # [phone] Pager [phone]

## 2017-11-09 NOTE — Discharge Summary (Signed)
DISCHARGE SUMMARY  Megan Roberson  MR#: [id number]  DOB:1956-02-18  Date of Admission: 10/29/2017 Date of Discharge: 11/09/2017  Attending Physician:Latoyna Hird T Thereasa Solo  Patient's PCP:  [facility]  Consults: GI IR Neurosurgery   Disposition: D/C home   Follow-up Appts: Follow-up Information    [facility] Follow up.   Why:  A representative from [facility] will contact you to arrange start date and time for therapy. Contact information: [place] [address] [phone]        Your Primary Care Doctor at [facility] Follow up.   Why:  See you primary care MD for a check up in one week.            Discharge Diagnoses: Ogilvie Syndrome / Ileus  Hepatic encephalopathy  Drug induced sedation for MRI   SacralFracture - Acute/Subacute Ascites Loculated RIGHT pleural effusion ESLD/Cirrhosis of Liver  Thrombocytopenia   Initial presentation: 62 y.o. F w/ a Hxof Anxiety, Depression, Heart murmur, Hypothyroidism, Pancytopenia, Neuropathy, and Cirhosis of liver w/ prior EtOH abuse who was brought in by her husband for increased confusion and frequent falls.   Hospital Course:  Ogilvie Syndrome / Ileus  per GI not a candidate for decompression - maximized lytes - utilized simethicone for comfort - advanced to reg diet w/o trouble - cont bowel regimen - KUB on day of d/c w/o evidence of resolution, but pt tolerating regular diet w/o trouble and having bowel movements so functionally resolved   AMS - multifactorial Due to hepatic encephalopathy + sedating meds for MRI - back to baseline at time of d/c   SacralFracture - Acute/Subacute L-spine MRI noted some mass-effect of the cauda equina - Neurosurgery reported no surgical options available for correction of deficit - patient was fitted with TLSO brace for use w/ ambulation - counseled that pain meds have risk of worsening ileus, but believe acute pain is valid and will attempt  to at least ameliorate it w/ low dose pain med - pt offered PT/OT in a SNF setting but she has declined - she agreed to HHPT  Ascites last paracentesis 1 week prior to admit w/ > 5 L fluid removed - IR found no ascites to tap this admit - appears to be re-accumulating per exam at time of d/c but not presently problematic clinically   Loculated RIGHT pleural effusion No clinical evidence of infection - effusion not large enough or clinically significant to require thoracentesis   ESLD/Cirrhosis of Liver - Hepatic encephalopathy Cont rifaximin - mental status at baseline on current tx   Thrombocytopenia (baseline 30-50K) At baseline   Allergies as of 11/09/2017      Reactions   Penicillins Swelling   FACIAL SWELLING PATIENT HAS HAD A PCN REACTION WITH IMMEDIATE RASH, FACIAL/TONGUE/THROAT SWELLING, SOB, OR LIGHTHEADEDNESS WITH HYPOTENSION:  #  #  #  YES  #  #  #   Has patient had a PCN reaction causing severe rash involving mucus membranes or skin necrosis: No Has patient had a PCN reaction that required hospitalization: No Has patient had a PCN reaction occurring within the last 10 years: No If all of the above answers are "NO", then may proceed with Cephalosporin u   Sulfa Antibiotics Itching, Rash      Medication List    STOP taking these medications   gabapentin 600 MG tablet Commonly known as:  NEURONTIN Replaced by:  gabapentin 100 MG capsule   Iron 325 (65 Fe) MG Tabs  TAKE these medications   gabapentin 100 MG capsule Commonly known as:  NEURONTIN Take 2 capsules (200 mg total) by mouth 3 (three) times daily. Replaces:  gabapentin 600 MG tablet   ibuprofen 200 MG tablet Commonly known as:  ADVIL,MOTRIN Take 1 tablet (200 mg total) by mouth every 6 (six) hours as needed for fever, headache or mild pain.   lactulose 10 GM/15ML solution Commonly known as:  CHRONULAC Take 15 mLs (10 g total) by mouth 3 (three) times daily. What changed:  how much to take     levothyroxine 50 MCG tablet Commonly known as:  SYNTHROID, LEVOTHROID Take 50 mcg daily before breakfast by mouth.   magic mouthwash w/lidocaine Soln Take 5 mLs by mouth 4 (four) times daily as needed for mouth pain.   Magnesium 250 MG Tabs Take 250 mg by mouth daily.   ondansetron 4 MG disintegrating tablet Commonly known as:  ZOFRAN ODT Take 1 tablet (4 mg total) every 8 (eight) hours as needed by mouth for nausea or vomiting.   oxyCODONE 5 MG immediate release tablet Commonly known as:  Oxy IR/ROXICODONE Take 1 tablet (5 mg total) by mouth every 6 (six) hours as needed for severe pain. What changed:    how much to take  when to take this   potassium chloride SA 20 MEQ tablet Commonly known as:  K-DUR,KLOR-CON Take 1 tablet (20 mEq total) by mouth daily. Start taking on:  11/10/2017 What changed:    medication strength  how much to take  when to take this   rifaximin 550 MG Tabs tablet Commonly known as:  XIFAXAN Take 1 tablet (550 mg total) by mouth 2 (two) times daily. What changed:    medication strength  how much to take  when to take this   senna-docusate 8.6-50 MG tablet Commonly known as:  Senokot-S Take 1 tablet by mouth 2 (two) times daily.   sertraline 100 MG tablet Commonly known as:  ZOLOFT Take 100 mg at bedtime by mouth.   traZODone 50 MG tablet Commonly known as:  DESYREL Take 50 mg at bedtime by mouth.       Day of Discharge BP 114/73 (BP Location: Left Arm)   Pulse 68   Temp 98.1 F (36.7 C) (Oral)   Resp 16   Ht 5' 7"  (1.702 m)   Wt 71.2 kg (156 lb 15.5 oz)   SpO2 99%   BMI 24.58 kg/m   Physical Exam: General: No acute respiratory distress - alert and conversant  Lungs: Clear to auscultation bilaterally without wheezes or crackles Cardiovascular: Regular rate and rhythm  Abdomen: Nontender, protuberant w/ modest ascites, soft, bowel sounds positive and high pitched, no rebound, no appreciable mass Extremities: No  significant cyanosis, clubbing, or edema bilateral lower extremities  Basic Metabolic Panel: Recent Labs  Lab 11/03/17 0920 11/05/17 0517 11/06/17 0608 11/07/17 0401 11/08/17 0907 11/09/17 0726  NA 138  138 138 140 135 137 138  K 3.9  3.9 3.6 3.8 3.4* 4.4 4.2  CL 110  110 111 110 108 109 106  CO2 23  22 22 23 22 22 26   GLUCOSE 117*  118* 85 90 96 93 84  BUN 12  12 12 10 13 14 13   CREATININE 0.75  0.76 0.62 0.64 0.56 0.55 0.46  CALCIUM 7.7*  7.7* 7.5* 7.9* 7.9* 7.8* 7.9*  MG 1.8 1.7 1.8  --   --  1.8  PHOS 2.5 3.1 3.0  --   --  3.3    Liver Function Tests: Recent Labs  Lab 11/03/17 0920 11/06/17 0608  AST 89* 62*  ALT 47 35  ALKPHOS 242* 268*  BILITOT 1.9* 2.7*  PROT 4.6* 5.1*  ALBUMIN 1.8* 2.0*    Recent Labs  Lab 11/03/17 0920  AMMONIA 39*    CBC: Recent Labs  Lab 11/03/17 0920 11/05/17 0517 11/06/17 0608 11/09/17 0726  WBC 3.9* 4.3 5.0 4.3  HGB 10.0* 10.1* 10.6* 10.4*  HCT 32.8* 33.4* 35.1* 33.7*  MCV 100.6* 96.3 96.2 99.1  PLT 32* 29* 34* 38*    CBG: Recent Labs  Lab 11/02/17 2354 11/03/17 0331 11/03/17 0815  GLUCAP 87 91 84    Time spent in discharge (includes decision making & examination of pt): 35 minutes  11/09/2017, 2:33 PM   Cherene Altes, MD [facility] Office  [phone] Pager [phone]  On-Call/Text Page:      [email or web address]      password [facility]

## 2017-11-09 NOTE — Discharge Instructions (Signed)
Ileus  Ileus is a condition in which the intestines, also called the bowels, stop working and moving correctly. If the intestines stop working, food cannot pass through to get digested. The intestines are hollow organs that digest food after the food leaves the stomach. These organs are long, muscular tubes that connect the stomach to the rectum. When ileus occurs, the muscular contractions that cause food to move through the intestines stop happening as they normally would. Ileus can occur for various reasons. This condition is a serious problem that usually requires hospitalization. It can cause symptoms such as nausea, abdominal pain, and bloating. Ileus can last from a few hours to a few days. If the intestines stop working because of a blockage, that is a different condition that is called a bowel obstruction. What are the causes? This condition may be caused by:  Surgery on the abdomen.  An infection or inflammation in the abdomen. This includes inflammation of the lining of the abdomen (peritonitis).  Infection or inflammation in other parts of the body, such as pneumonia or pancreatitis.  Passage of gallstones or kidney stones.  Damage to the nerves or blood vessels that go to the intestines.  A collection of blood within the abdominal cavity.  Imbalance in the salts in the blood (electrolytes).  Injury to the brain or spinal cord.  Medicines. Many medicines, including strong pain medicines, can cause ileus or make it worse.  What are the signs or symptoms? Symptoms of this condition include:  Bloating of the abdomen.  Pain or discomfort in the abdomen.  Poor appetite.  Nausea and vomiting.  Lack of normal bowel sounds, such as growling" in the stomach.  How is this diagnosed? This condition may be diagnosed with:  A physical exam and medical history.  X-rays or a CT scan of the abdomen.  You may also have other tests to help find the cause of the condition. How  is this treated? Treatment for this condition may include:  Resting the intestines until they start to work again. This is often done by: ? Stopping oral intake of food and drink. You will be given fluid through an IV tube to prevent dehydration. ? Placing a small tube (nasogastric tube or NG tube) that is passed through your nose and into your stomach. The tube is attached to a suction device and keeps the stomach emptied out. This allows the bowels to rest and also helps to reduce nausea and vomiting.  Correcting any electrolyte imbalance by giving supplements in the IV fluid.  Stopping any medicines that might make ileus worse.  Treating any condition that may have caused ileus.  Follow these instructions at home:  Follow instructions from your health care provider about diet and fluid intake. Usually, you will be told to: ? Drink plenty of clear fluids. ? Avoid alcohol. ? Avoid caffeine. ? Eat a bland diet.  Get plenty of rest. Return to your normal activities as told by your health care provider.  Take over-the-counter and prescription medicines only as told by your health care provider.  Keep all follow-up visits as told by your health care provider. This is important. Contact a health care provider if:  You have nausea, vomiting, or abdominal discomfort.  You have a fever. Get help right away if:  You have severe abdominal pain or bloating.  You cannot eat or drink without vomiting. This information is not intended to replace advice given to you by your health care provider. Make sure  you discuss any questions you have with your health care provider. Document Released: 10/22/2003 Document Revised: 03/26/2016 Document Reviewed: 12/13/2014 Elsevier Interactive Patient Education  2018 [employer].   Cirrhosis Cirrhosis is long-term (chronic) liver injury. The liver is your largest internal organ, and it performs many functions. The liver converts food into energy,  removes toxic material from your blood, makes important proteins, and absorbs necessary vitamins from your diet. If you have cirrhosis, it means many of your healthy liver cells have been replaced by scar tissue. This prevents blood from flowing through your liver, which makes it difficult for your liver to function. This scarring is not reversible, but treatment can prevent it from getting worse. What are the causes? Hepatitis C and long-term alcohol abuse are the most common causes of cirrhosis. Other causes include:  Nonalcoholic fatty liver disease.  Hepatitis B infection.  Autoimmune hepatitis.  Diseases that cause blockage of ducts inside the liver.  Inherited liver diseases.  Reactions to certain long-term medicines.  Parasitic infections.  Long-term exposure to certain toxins.  What increases the risk? You may have a higher risk of cirrhosis if you:  Have certain hepatitis viruses.  Abuse alcohol, especially if you are female.  Are overweight.  Share needles.  Have unprotected sex with someone who has hepatitis.  What are the signs or symptoms? You may not have any signs and symptoms at first. Symptoms may not develop until the damage to your liver starts to get worse. Signs and symptoms of cirrhosis may include:  Tenderness in the right-upper part of your abdomen.  Weakness and tiredness (fatigue).  Loss of appetite.  Nausea.  Weight loss and muscle loss.  Itchiness.  Yellow skin and eyes (jaundice).  Buildup of fluid in the abdomen (ascites).  Swelling of the feet and ankles (edema).  Appearance of tiny blood vessels under the skin.  Mental confusion.  Easy bruising and bleeding.  How is this diagnosed? Your health care provider may suspect cirrhosis based on your symptoms and medical history, especially if you have other medical conditions or a history of alcohol abuse. Your health care provider will do a physical exam to feel your liver and  check for signs of cirrhosis. Your health care provider may perform other tests, including:  Blood tests to check: ? Whether you have hepatitis B or C. ? Kidney function. ? Liver function.  Imaging tests such as: ? MRI or CT scan to look for changes seen in advanced cirrhosis. ? Ultrasound to see if normal liver tissue is being replaced by scar tissue.  A procedure using a long needle to take a sample of liver tissue (biopsy) for examination under a microscope. Liver biopsy can confirm the diagnosis of cirrhosis.  How is this treated? Treatment depends on how damaged your liver is and what caused the damage. Treatment may include treating cirrhosis symptoms or treating the underlying causes of the condition to try to slow the progression of the damage. Treatment may include:  Making lifestyle changes, such as: ? Eating a healthy diet. ? Restricting salt intake. ? Maintaining a healthy weight. ? Not abusing drugs or alcohol.  Taking medicines to: ? Treat liver infections or other infections. ? Control itching. ? Reduce fluid buildup. ? Reduce certain blood toxins. ? Reduce risk of bleeding from enlarged blood vessels in the stomach or esophagus (varices).  If varices are causing bleeding problems, you may need treatment with a procedure that ties up the vessels causing them to fall  off (band ligation).  If cirrhosis is causing your liver to fail, your health care provider may recommend a liver transplant.  Other treatments may be recommended depending on any complications of cirrhosis, such as liver-related kidney failure (hepatorenal syndrome).  Follow these instructions at home:  Take medicines only as directed by your health care provider. Do not use drugs that are toxic to your liver. Ask your health care provider before taking any new medicines, including over-the-counter medicines.  Rest as needed.  Eat a well-balanced diet. Ask your health care provider or dietitian for  more information.  You may have to follow a low-salt diet or restrict your water intake as directed.  Do not drink alcohol. This is especially important if you are taking acetaminophen.  Keep all follow-up visits as directed by your health care provider. This is important. Contact a health care provider if:  You have fatigue or weakness that is getting worse.  You develop swelling of the hands, feet, legs, or face.  You have a fever.  You develop loss of appetite.  You have nausea or vomiting.  You develop jaundice.  You develop easy bruising or bleeding. Get help right away if:  You vomit bright red blood or a material that looks like coffee grounds.  You have blood in your stools.  Your stools appear black and tarry.  You become confused.  You have chest pain or trouble breathing. This information is not intended to replace advice given to you by your health care provider. Make sure you discuss any questions you have with your health care provider. Document Released: 10/19/2005 Document Revised: 02/27/2016 Document Reviewed: 06/27/2014 Elsevier Interactive Patient Education  [employer].

## 2017-11-16 ENCOUNTER — Emergency Department (HOSPITAL_COMMUNITY): Payer: Medicare HMO

## 2017-11-16 ENCOUNTER — Emergency Department (HOSPITAL_COMMUNITY)
Admission: EM | Admit: 2017-11-16 | Discharge: 2017-11-16 | Disposition: A | Discharge status: Home / Self Care | Payer: Medicare HMO | Attending: Emergency Medicine | Admitting: Emergency Medicine

## 2017-11-16 ENCOUNTER — Encounter (HOSPITAL_COMMUNITY): Payer: Self-pay | Admitting: Emergency Medicine

## 2017-11-16 DIAGNOSIS — E039 Hypothyroidism, unspecified: Secondary | ICD-10-CM | POA: Diagnosis not present

## 2017-11-16 DIAGNOSIS — R05 Cough: Secondary | ICD-10-CM | POA: Diagnosis not present

## 2017-11-16 DIAGNOSIS — J181 Lobar pneumonia, unspecified organism: Secondary | ICD-10-CM | POA: Diagnosis not present

## 2017-11-16 DIAGNOSIS — Z79899 Other long term (current) drug therapy: Secondary | ICD-10-CM | POA: Insufficient documentation

## 2017-11-16 DIAGNOSIS — K7031 Alcoholic cirrhosis of liver with ascites: Secondary | ICD-10-CM | POA: Diagnosis not present

## 2017-11-16 DIAGNOSIS — J189 Pneumonia, unspecified organism: Secondary | ICD-10-CM

## 2017-11-16 DIAGNOSIS — R0602 Shortness of breath: Secondary | ICD-10-CM | POA: Diagnosis not present

## 2017-11-16 DIAGNOSIS — R188 Other ascites: Secondary | ICD-10-CM

## 2017-11-16 DIAGNOSIS — R140 Abdominal distension (gaseous): Secondary | ICD-10-CM

## 2017-11-16 DIAGNOSIS — F1010 Alcohol abuse, uncomplicated: Secondary | ICD-10-CM | POA: Diagnosis not present

## 2017-11-16 DIAGNOSIS — K8020 Calculus of gallbladder without cholecystitis without obstruction: Secondary | ICD-10-CM | POA: Diagnosis not present

## 2017-11-16 LAB — URINALYSIS, ROUTINE W REFLEX MICROSCOPIC
Glucose, UA: NEGATIVE mg/dL
Ketones, ur: 5 mg/dL — AB
Leukocytes, UA: NEGATIVE
Nitrite: NEGATIVE
Protein, ur: 100 mg/dL — AB
Specific Gravity, Urine: 1.033 — ABNORMAL HIGH (ref 1.005–1.030)
pH: 6 (ref 5.0–8.0)

## 2017-11-16 LAB — COMPREHENSIVE METABOLIC PANEL
ALT: 22 U/L (ref 14–54)
AST: 55 U/L — ABNORMAL HIGH (ref 15–41)
Albumin: 2.4 g/dL — ABNORMAL LOW (ref 3.5–5.0)
Alkaline Phosphatase: 253 U/L — ABNORMAL HIGH (ref 38–126)
Anion gap: 7 (ref 5–15)
BUN: 12 mg/dL (ref 6–20)
CO2: 24 mmol/L (ref 22–32)
Calcium: 8.2 mg/dL — ABNORMAL LOW (ref 8.9–10.3)
Chloride: 110 mmol/L (ref 101–111)
Creatinine, Ser: 0.61 mg/dL (ref 0.44–1.00)
GFR calc Af Amer: >60 mL/min (ref >60)
GFR calc non Af Amer: >60 mL/min (ref >60)
Glucose, Bld: 90 mg/dL (ref 65–99)
Potassium: 3.7 mmol/L (ref 3.5–5.1)
Sodium: 141 mmol/L (ref 135–145)
Total Bilirubin: 2.8 mg/dL — ABNORMAL HIGH (ref 0.3–1.2)
Total Protein: 5.8 g/dL — ABNORMAL LOW (ref 6.5–8.1)

## 2017-11-16 LAB — CBC
HCT: 36.9 % (ref 36.0–46.0)
Hemoglobin: 11.5 g/dL — ABNORMAL LOW (ref 12.0–15.0)
MCH: 30.7 pg (ref 26.0–34.0)
MCHC: 31.2 g/dL (ref 30.0–36.0)
MCV: 98.4 fL (ref 78.0–100.0)
Platelets: 44 10*3/uL — ABNORMAL LOW (ref 150–400)
RBC: 3.75 MIL/uL — ABNORMAL LOW (ref 3.87–5.11)
RDW: 18.4 % — ABNORMAL HIGH (ref 11.5–15.5)
WBC: 6 10*3/uL (ref 4.0–10.5)

## 2017-11-16 LAB — AMMONIA: Ammonia: 17 umol/L (ref 9–35)

## 2017-11-16 LAB — TYPE AND SCREEN
ABO/RH(D): O POS
Antibody Screen: NEGATIVE

## 2017-11-16 LAB — I-STAT TROPONIN, ED: Troponin i, poc: 0.01 ng/mL (ref 0.00–0.08)

## 2017-11-16 LAB — I-STAT BETA HCG BLOOD, ED (MC, WL, AP ONLY): I-stat hCG, quantitative: <5 m[IU]/mL (ref <5)

## 2017-11-16 MED ORDER — LEVOFLOXACIN 750 MG PO TABS
750.0000 mg | ORAL_TABLET | Freq: Once | ORAL | Status: AC
Start: 2017-11-16 — End: 2017-11-16
  Administered 2017-11-16: 750 mg via ORAL
  Filled 2017-11-16: qty 1

## 2017-11-16 MED ORDER — HYDROCODONE-HOMATROPINE 5-1.5 MG/5ML PO SYRP: ORAL_SOLUTION | ORAL | 0 refills | Status: DC

## 2017-11-16 MED ORDER — CLOTRIMAZOLE 10 MG MT TROC: 10.0000 mg | Freq: Every day | OROMUCOSAL | 1 refills | Status: DC

## 2017-11-16 MED ORDER — HYDROCODONE-HOMATROPINE 5-1.5 MG/5ML PO SYRP
10.0000 mL | ORAL_SOLUTION | Freq: Once | ORAL | Status: AC
  Administered 2017-11-16: 10 mL via ORAL
  Filled 2017-11-16: qty 10

## 2017-11-16 MED ORDER — ALBUTEROL SULFATE HFA 108 (90 BASE) MCG/ACT IN AERS
2.0000 | INHALATION_SPRAY | Freq: Once | RESPIRATORY_TRACT | Status: AC
  Administered 2017-11-16: 2 via RESPIRATORY_TRACT
  Filled 2017-11-16: qty 6.7

## 2017-11-16 MED ORDER — LEVOFLOXACIN 750 MG PO TABS: 750.0000 mg | ORAL_TABLET | Freq: Every day | ORAL | 0 refills | Status: DC

## 2017-11-16 NOTE — ED Triage Notes (Signed)
pts family reports pt left the hospital about a week ago, has had increased abdominal distention since then, hx of liver failure. Sob began 3 days ago. Pt also reports bright red blood with bowel movements.

## 2017-11-16 NOTE — ED Notes (Signed)
Ice chips given to pt.  Okayed by Johnney Killian EDP.  Family remains at bedside,.

## 2017-11-16 NOTE — ED Notes (Signed)
Pt reports she admitted to the hospital with ileus and was d/c from the hospital 5 days ago.  She endorses SOB and pain in her back.  Pt reports she has ascites and laying flat on her back is the only comfortable position for her.  Pt's abdomen is distended and nontender.  She reports she has not been able to eat or drink because her abdomen is full.  Last time she had a paracentesis was about a month ago with at least 5027m of fluid pulled.  Pt is A&Ox 4.  Husband is at bedside.  Multiple bruises noted on pt's bila arms, one on the R lower side of her neck.

## 2017-11-16 NOTE — ED Notes (Signed)
Placed pt on bedside commode.

## 2017-11-16 NOTE — Discharge Instructions (Signed)
1.  Schedule appointment as soon as possible to follow-up with a gastroenterologist. Information for [facility] is listed in your discharge instructions. You have also Been seen by a [facility] provider and may follow up with them. 2.  You must also follow-up with family doctor as soon as possible. 3.  Start Levaquin as prescribed for highly suspicious area on chest x-ray for pneumonia.  He should have a recheck within 3-5 days to see how you are doing.  Use the inhaler as prescribed every 4-6 hours for cough and wheeze.

## 2017-11-16 NOTE — ED Notes (Signed)
Pure wick in place 

## 2017-11-16 NOTE — ED Provider Notes (Signed)
[facility] EMERGENCY DEPARTMENT Provider Note   CSN: [id number] Arrival date & time: 11/16/17  1450     History   Chief Complaint Chief Complaint  Patient presents with  . Shortness of Breath  . Ascites    HPI Megan Roberson is a 62 y.o. female.  HPI She was discharged from the hospital a week ago.  She has cirrhotic liver failure with history of alcohol abuse.  She has had continuing abdominal distention and pain.  She reports she has persistent cough.  No fever.  No vomiting.  She reports she always has very loose stool and it has been watery and red.  She has felt increasingly short of breath for about 3 days.  She reports she does not take her Lasix and lactulose regularly because it is so difficult for her to have to get up and go to the bathroom multiple times. Past Medical History:  Diagnosis Date  . Anxiety   . Cirrhosis of liver ([place])   . Depression   . Heart murmur    "not concerned about "   . History of abdominal paracentesis   . History of blood transfusion   . Hypothyroidism   . Liver disease 2008  . Neuropathy   . Pancytopenia [facility])     Patient Active Problem List   Diagnosis Date Noted  . Protein-calorie malnutrition, severe 11/06/2017  . Ogilvie's syndrome   . Abdominal distension   . Abdominal pain   . Liver failure without hepatic coma ([place])   . Thrombocytopenia ([place])   . Closed fracture of lumbar spine without lesion of spinal cord ([place])   . Cirrhosis of liver ([place]) 04/21/2017  . Anemia 04/21/2017  . History of open reduction and internal fixation (ORIF) procedure 04/21/2017  . Visit for suture removal 04/21/2017  . Left Olecranon fracture 04/21/2017  . Hypokalemia 04/21/2017    Past Surgical History:  Procedure Laterality Date  . COLONOSCOPY WITH ESOPHAGOGASTRODUODENOSCOPY (EGD) AND ESOPHAGEAL DILATION (ED)    . ESOPHAGOGASTRODUODENOSCOPY W/ BANDING     Varices  . FRACTURE SURGERY Left    x 2  . HARDWARE REMOVAL Left  09/17/2017   Procedure: HARDWARE REMOVAL LEFT OLECRANON;  Surgeon: Altamese Germantown, MD;  Location: [place];  Service: Orthopedics;  Laterality: Left;  . HIP SURGERY Left    from fracture- rod in palce  . KNEE SURGERY Bilateral    open incision for ligaments  . ORIF ELBOW FRACTURE Left 04/30/2017   Procedure: REVISION OPEN REDUCTION INTERNAL FIXATION (ORIF) ELBOW/OLECRANON FRACTURE;  Surgeon: Altamese Crystal, MD;  Location: [place];  Service: Orthopedics;  Laterality: Left;    OB History    No data available       Home Medications    Prior to Admission medications   Medication Sig Start Date End Date Taking? Authorizing Provider  gabapentin (NEURONTIN) 100 MG capsule Take 2 capsules (200 mg total) by mouth 3 (three) times daily. 11/09/17  Yes Cherene Altes, MD  ibuprofen (ADVIL,MOTRIN) 200 MG tablet Take 1 tablet (200 mg total) by mouth every 6 (six) hours as needed for fever, headache or mild pain. 11/09/17  Yes Cherene Altes, MD  levothyroxine (SYNTHROID, LEVOTHROID) 50 MCG tablet Take 50 mcg daily before breakfast by mouth.    Yes [provider]  Magnesium 250 MG TABS Take 250 mg by mouth daily.   Yes [provider]  ondansetron (ZOFRAN ODT) 4 MG disintegrating tablet Take 1 tablet (4 mg total) every  8 (eight) hours as needed by mouth for nausea or vomiting. 09/17/17  Yes Ainsley Spinner, PA-C  polyethylene glycol (MIRALAX / GLYCOLAX) packet Take 17 g by mouth daily.   Yes [provider]  potassium chloride SA (K-DUR,KLOR-CON) 20 MEQ tablet Take 1 tablet (20 mEq total) by mouth daily. 11/10/17  Yes Cherene Altes, MD  QUEtiapine (SEROQUEL) 25 MG tablet Take 25 mg by mouth at bedtime.   Yes [provider]  rifaximin (XIFAXAN) 550 MG TABS tablet Take 1 tablet (550 mg total) by mouth 2 (two) times daily. 11/09/17  Yes Cherene Altes, MD  sertraline (ZOLOFT) 100 MG tablet Take 100 mg at bedtime by mouth.   Yes [provider]  traZODone  (DESYREL) 50 MG tablet Take 50 mg at bedtime by mouth.   Yes [provider]  clotrimazole (MYCELEX) 10 MG troche Take 1 tablet (10 mg total) by mouth 5 (five) times daily. 11/16/17   Charlesetta Shanks, MD  HYDROcodone-homatropine (HYCODAN) 5-1.5 MG/5ML syrup 5-10 mL every 6 hours as needed for cough. 11/16/17   Charlesetta Shanks, MD  lactulose (CHRONULAC) 10 GM/15ML solution Take 15 mLs (10 g total) by mouth 3 (three) times daily. Patient not taking: Reported on 11/16/2017 11/09/17   Cherene Altes, MD  levofloxacin (LEVAQUIN) 750 MG tablet Take 1 tablet (750 mg total) by mouth daily. X 7 days 11/16/17   Charlesetta Shanks, MD  magic mouthwash w/lidocaine SOLN Take 5 mLs by mouth 4 (four) times daily as needed for mouth pain. Patient not taking: Reported on 11/16/2017 11/09/17   Cherene Altes, MD  oxyCODONE (OXY IR/ROXICODONE) 5 MG immediate release tablet Take 1 tablet (5 mg total) by mouth every 6 (six) hours as needed for severe pain. Patient not taking: Reported on 11/16/2017 11/09/17   Cherene Altes, MD  senna-docusate (SENOKOT-S) 8.6-50 MG tablet Take 1 tablet by mouth 2 (two) times daily. Patient not taking: Reported on 11/16/2017 11/09/17   Cherene Altes, MD    Family History Family History  Adopted: Yes    Social History Social History   Tobacco Use  . Smoking status: Never Smoker  . Smokeless tobacco: Never Used  Substance Use Topics  . Alcohol use: No  . Drug use: No     Allergies   Penicillins and Sulfa antibiotics   Review of Systems Review of Systems 10 Systems reviewed and are negative for acute change except as noted in the HPI.   Physical Exam Updated Vital Signs BP (!) 145/88   Pulse 77   Temp 98.5 F (36.9 C) (Oral)   Resp 18   SpO2 96%   Physical Exam  Constitutional: She is oriented to person, place, and time.  Patient is alert and nontoxic but deconditioned in appearance.  Status is clear.  No respiratory distress at rest.  HENT:    Head: Normocephalic and atraumatic.  Mucous membranes are pink and moist.  Slight increased erythema diffusely of mucous membranes.  Eyes: EOM are normal. Pupils are equal, round, and reactive to light.  Patient does not have severe scleral icterus.  Neck: Neck supple.  Cardiovascular: Normal rate, regular rhythm, normal heart sounds and intact distal pulses.  Pulmonary/Chest:  No respiratory distress at rest.  Patient does have moderately frequent wet sounding cough.  Crackles in the right base.  Occasional expiratory wheeze bilaterally.  Abdominal:  Abdomen is very distended but pliable.  No erythema and no peritoneal signs.  Musculoskeletal: Normal range of motion.  She exhibits no edema.  Patient does not have significant edema of the extremities.  Extremities are very thin and atrophied.  She has extensive bruising over all extremities.  Neurological: She is alert and oriented to person, place, and time. No cranial nerve deficit. She exhibits normal muscle tone. Coordination normal.  Patient is alert and appropriate with clear sensorium.  speach is normal.  Patient has been up to bedside commode and sits up in the bed in coordinated fashion.  She does not have any active tremor or focal extremity weakness.  Skin: Skin is warm and dry.  Extensive ecchymoses over all extremities.  Psychiatric: She has a normal mood and affect.     ED Treatments / Results  Labs (all labs ordered are listed, but only abnormal results are displayed) Labs Reviewed  COMPREHENSIVE METABOLIC PANEL - Abnormal; Notable for the following components:      Result Value   Calcium 8.2 (*)    Total Protein 5.8 (*)    Albumin 2.4 (*)    AST 55 (*)    Alkaline Phosphatase 253 (*)    Total Bilirubin 2.8 (*)    All other components within normal limits  CBC - Abnormal; Notable for the following components:   RBC 3.75 (*)    Hemoglobin 11.5 (*)    RDW 18.4 (*)    Platelets 44 (*)    All other components within  normal limits  URINALYSIS, ROUTINE W REFLEX MICROSCOPIC - Abnormal; Notable for the following components:   Color, Urine AMBER (*)    APPearance HAZY (*)    Specific Gravity, Urine 1.033 (*)    Hgb urine dipstick LARGE (*)    Bilirubin Urine SMALL (*)    Ketones, ur 5 (*)    Protein, ur 100 (*)    Bacteria, UA RARE (*)    Squamous Epithelial / LPF 0-5 (*)    Non Squamous Epithelial 0-5 (*)    All other components within normal limits  AMMONIA  I-STAT BETA HCG BLOOD, ED ([facility], [facility], [facility] ONLY)  I-STAT TROPONIN, ED  POC OCCULT BLOOD, ED  TYPE AND SCREEN    EKG  EKG Interpretation None       Radiology Dg Chest 2 View  Result Date: 11/16/2017 CLINICAL DATA:  Shortness of breath. EXAM: CHEST  2 VIEW COMPARISON:  Chest x-ray dated October 29, 2017. FINDINGS: The heart size and mediastinal contours are within normal limits. Normal pulmonary vascularity. Consolidation within the superior segment of the right lower lobe, with increased patchy opacities in the right lower lobe. The left lung is clear. No pleural effusion or pneumothorax. No acute osseous abnormality. IMPRESSION: Worsening right lower lobe pneumonia. Followup PA and lateral chest X-ray is recommended in 3-4 weeks following trial of antibiotic therapy to ensure resolution and exclude underlying malignancy. Electronically Signed   By: Titus Dubin M.D.   On: 11/16/2017 15:41   Dg Abdomen 1 View  Result Date: 11/16/2017 CLINICAL DATA:  Abdominal distension with loss of appetite EXAM: ABDOMEN - 1 VIEW COMPARISON:  11/09/2017, 11/06/2017, 11/04/2017, 11/03/2017 radiographs, CT abdomen pelvis 10/29/2017 FINDINGS: Visible lung bases are clear. Decreased gaseous dilatation of large and small bowel since the prior radiograph. There is a small amount of gas in the rectum. Redemonstrated right upper quadrant calcifications consistent with gallstone and kidney stone noted on comparison CT. Partially visualized fixating hardware in the left  femur IMPRESSION: Nonobstructed gas pattern with overall decreased small and large bowel gaseous enlargement since the  prior radiograph Gallstones and right kidney stone Electronically Signed   By: Donavan Foil M.D.   On: 11/16/2017 17:17    Procedures Procedures (including critical care time)  Medications Ordered in ED Medications  HYDROcodone-homatropine (HYCODAN) 5-1.5 MG/5ML syrup 10 mL (10 mLs Oral Given 11/16/17 2028)  levofloxacin (LEVAQUIN) tablet 750 mg (750 mg Oral Given 11/16/17 2028)  albuterol (PROVENTIL HFA;VENTOLIN HFA) 108 (90 Base) MCG/ACT inhaler 2 puff (2 puffs Inhalation Given 11/16/17 2031)     Initial Impression / Assessment and Plan / ED Course  I have reviewed the triage vital signs and the nursing notes.  Pertinent labs & imaging results that were available during my care of the patient were reviewed by me and considered in my medical decision making (see chart for details).      Final Clinical Impressions(s) / ED Diagnoses   Final diagnoses:  Alcoholic cirrhosis of liver with ascites ([place])  Community acquired pneumonia of right lower lobe of lung ([place])  Abdominal distension  Shortness of breath  Ascites   Patient has end-stage cirrhosis.  Abdomen is distended with fluid consistent with ascites.  No suspicion for spontaneous bacterial peritonitis.  Abdomen is pliable without erythema.  Patient reports that she has had multiple taps and never been positive for infection and she does not feel like she has an intra-abdominal infection now.  Notably she does have shortness of breath and right lower lobe infiltrate is deemed to be worse by radiological interpretation.  Patient does have very wet cough and abnormal breath sounds in the base of the right lung.  This time, will opt to treat this with antibiotics.  Patient does have multiple allergies.  Will use Levaquin and counseled to use as needed albuterol.  Patient is given Hycodan syrup for cough.  Arrangements  have been made for therapeutic paracentesis by interventional radiology tomorrow.  Orders been placed and patient will be contacted.  Patient is counseled to resume her recommended use of Lasix and lactulose to maintain fluid balance and pneumonia which at this time appear reasonably controlled.  She is also counseled on how important it is to schedule her follow-ups as an outpatient with gastroenterology for persistent management of her cirrhosis and ascites. ED Discharge Orders        Ordered    HYDROcodone-homatropine [facility]) 5-1.5 MG/5ML syrup     11/16/17 2013    clotrimazole (MYCELEX) 10 MG troche  5 times daily     11/16/17 2013    levofloxacin (LEVAQUIN) 750 MG tablet  Daily     11/16/17 2013    US Paracentesis     11/16/17 2045       Charlesetta Shanks, MD 11/16/17 2105

## 2017-11-17 ENCOUNTER — Telehealth (HOSPITAL_COMMUNITY): Payer: Self-pay

## 2017-11-17 NOTE — Telephone Encounter (Signed)
LM for patient to call us back to schedule her Para.  We can schedule for 1p today.  Left call back number for her to call if she can come at 1p

## 2017-11-18 ENCOUNTER — Ambulatory Visit (HOSPITAL_COMMUNITY)
Admission: RE | Admit: 2017-11-18 | Discharge: 2017-11-18 | Disposition: A | Discharge status: Home / Self Care | Payer: Medicare HMO | Source: Ambulatory Visit | Attending: Emergency Medicine | Admitting: Emergency Medicine

## 2017-11-18 ENCOUNTER — Encounter (HOSPITAL_COMMUNITY): Payer: Self-pay | Admitting: Radiology

## 2017-11-18 DIAGNOSIS — K7460 Unspecified cirrhosis of liver: Secondary | ICD-10-CM | POA: Diagnosis not present

## 2017-11-18 DIAGNOSIS — R188 Other ascites: Secondary | ICD-10-CM | POA: Insufficient documentation

## 2017-11-18 HISTORY — PX: IR PARACENTESIS: IMG2679

## 2017-11-18 MED ORDER — LIDOCAINE 2% (20 MG/ML) 5 ML SYRINGE
INTRAMUSCULAR | Status: AC
  Filled 2017-11-18: qty 5

## 2017-11-18 MED ORDER — LIDOCAINE HCL (PF) 2 % IJ SOLN
INTRAMUSCULAR | Status: DC | PRN
  Administered 2017-11-18: 10 mL

## 2017-11-18 MED ORDER — ALBUMIN HUMAN 25 % IV SOLN
50.0000 g | Freq: Once | INTRAVENOUS | Status: AC
Start: 2017-11-18 — End: 2017-11-18
  Administered 2017-11-18: 50 g via INTRAVENOUS
  Filled 2017-11-18: qty 200

## 2017-11-18 NOTE — Procedures (Signed)
PROCEDURE SUMMARY:  Successful US guided paracentesis from LLQ.  Yielded 5.1 L of clear yellow fluid.  No immediate complications.  Pt tolerated well.   Specimen was not sent for labs.  Ascencion Dike PA-C 11/18/2017 3:23 PM

## 2017-11-19 ENCOUNTER — Encounter: Payer: Self-pay | Admitting: Gastroenterology

## 2017-11-25 ENCOUNTER — Inpatient Hospital Stay (HOSPITAL_COMMUNITY)
Admission: EM | Admit: 2017-11-25 | Discharge: 2017-12-01 | DRG: 603 | Disposition: A | Discharge status: Home Health | Payer: Medicare HMO | Attending: Internal Medicine | Admitting: Internal Medicine

## 2017-11-25 ENCOUNTER — Other Ambulatory Visit: Payer: Self-pay

## 2017-11-25 ENCOUNTER — Emergency Department (HOSPITAL_COMMUNITY): Payer: Medicare HMO

## 2017-11-25 ENCOUNTER — Encounter (HOSPITAL_COMMUNITY): Payer: Self-pay

## 2017-11-25 DIAGNOSIS — K7031 Alcoholic cirrhosis of liver with ascites: Secondary | ICD-10-CM | POA: Diagnosis not present

## 2017-11-25 DIAGNOSIS — D638 Anemia in other chronic diseases classified elsewhere: Secondary | ICD-10-CM | POA: Diagnosis not present

## 2017-11-25 DIAGNOSIS — R188 Other ascites: Secondary | ICD-10-CM | POA: Diagnosis not present

## 2017-11-25 DIAGNOSIS — S32502A Unspecified fracture of left pubis, initial encounter for closed fracture: Secondary | ICD-10-CM | POA: Diagnosis not present

## 2017-11-25 DIAGNOSIS — D6959 Other secondary thrombocytopenia: Secondary | ICD-10-CM | POA: Diagnosis not present

## 2017-11-25 DIAGNOSIS — Z9119 Patient's noncompliance with other medical treatment and regimen: Secondary | ICD-10-CM | POA: Diagnosis not present

## 2017-11-25 DIAGNOSIS — F419 Anxiety disorder, unspecified: Secondary | ICD-10-CM | POA: Diagnosis present

## 2017-11-25 DIAGNOSIS — R0602 Shortness of breath: Secondary | ICD-10-CM | POA: Diagnosis present

## 2017-11-25 DIAGNOSIS — E876 Hypokalemia: Secondary | ICD-10-CM | POA: Diagnosis present

## 2017-11-25 DIAGNOSIS — R64 Cachexia: Secondary | ICD-10-CM | POA: Diagnosis present

## 2017-11-25 DIAGNOSIS — D649 Anemia, unspecified: Secondary | ICD-10-CM | POA: Diagnosis present

## 2017-11-25 DIAGNOSIS — D696 Thrombocytopenia, unspecified: Secondary | ICD-10-CM | POA: Diagnosis present

## 2017-11-25 DIAGNOSIS — Z79890 Hormone replacement therapy (postmenopausal): Secondary | ICD-10-CM | POA: Diagnosis not present

## 2017-11-25 DIAGNOSIS — R1084 Generalized abdominal pain: Secondary | ICD-10-CM | POA: Diagnosis not present

## 2017-11-25 DIAGNOSIS — K429 Umbilical hernia without obstruction or gangrene: Secondary | ICD-10-CM | POA: Diagnosis present

## 2017-11-25 DIAGNOSIS — E039 Hypothyroidism, unspecified: Secondary | ICD-10-CM | POA: Diagnosis present

## 2017-11-25 DIAGNOSIS — Y92009 Unspecified place in unspecified non-institutional (private) residence as the place of occurrence of the external cause: Secondary | ICD-10-CM | POA: Diagnosis not present

## 2017-11-25 DIAGNOSIS — M899 Disorder of bone, unspecified: Secondary | ICD-10-CM | POA: Diagnosis not present

## 2017-11-25 DIAGNOSIS — S32592A Other specified fracture of left pubis, initial encounter for closed fracture: Secondary | ICD-10-CM | POA: Diagnosis present

## 2017-11-25 DIAGNOSIS — F418 Other specified anxiety disorders: Secondary | ICD-10-CM | POA: Diagnosis present

## 2017-11-25 DIAGNOSIS — J90 Pleural effusion, not elsewhere classified: Secondary | ICD-10-CM | POA: Diagnosis not present

## 2017-11-25 DIAGNOSIS — R161 Splenomegaly, not elsewhere classified: Secondary | ICD-10-CM | POA: Diagnosis present

## 2017-11-25 DIAGNOSIS — Z880 Allergy status to penicillin: Secondary | ICD-10-CM

## 2017-11-25 DIAGNOSIS — K7460 Unspecified cirrhosis of liver: Secondary | ICD-10-CM | POA: Diagnosis not present

## 2017-11-25 DIAGNOSIS — K7290 Hepatic failure, unspecified without coma: Secondary | ICD-10-CM | POA: Diagnosis present

## 2017-11-25 DIAGNOSIS — L03311 Cellulitis of abdominal wall: Principal | ICD-10-CM | POA: Diagnosis present

## 2017-11-25 DIAGNOSIS — F329 Major depressive disorder, single episode, unspecified: Secondary | ICD-10-CM | POA: Diagnosis present

## 2017-11-25 DIAGNOSIS — E7220 Disorder of urea cycle metabolism, unspecified: Secondary | ICD-10-CM | POA: Diagnosis not present

## 2017-11-25 DIAGNOSIS — K766 Portal hypertension: Secondary | ICD-10-CM | POA: Diagnosis present

## 2017-11-25 DIAGNOSIS — Z681 Body mass index (BMI) 19 or less, adult: Secondary | ICD-10-CM

## 2017-11-25 DIAGNOSIS — Z882 Allergy status to sulfonamides status: Secondary | ICD-10-CM | POA: Diagnosis not present

## 2017-11-25 DIAGNOSIS — K7581 Nonalcoholic steatohepatitis (NASH): Secondary | ICD-10-CM | POA: Diagnosis present

## 2017-11-25 DIAGNOSIS — E46 Unspecified protein-calorie malnutrition: Secondary | ICD-10-CM | POA: Diagnosis not present

## 2017-11-25 DIAGNOSIS — K529 Noninfective gastroenteritis and colitis, unspecified: Secondary | ICD-10-CM | POA: Diagnosis present

## 2017-11-25 DIAGNOSIS — W19XXXA Unspecified fall, initial encounter: Secondary | ICD-10-CM | POA: Diagnosis not present

## 2017-11-25 DIAGNOSIS — K7030 Alcoholic cirrhosis of liver without ascites: Secondary | ICD-10-CM | POA: Diagnosis not present

## 2017-11-25 DIAGNOSIS — R262 Difficulty in walking, not elsewhere classified: Secondary | ICD-10-CM | POA: Diagnosis not present

## 2017-11-25 DIAGNOSIS — E440 Moderate protein-calorie malnutrition: Secondary | ICD-10-CM | POA: Diagnosis present

## 2017-11-25 DIAGNOSIS — R109 Unspecified abdominal pain: Secondary | ICD-10-CM | POA: Diagnosis not present

## 2017-11-25 HISTORY — DX: Ileus, unspecified: K56.7

## 2017-11-25 HISTORY — DX: Pneumonia, unspecified organism: J18.9

## 2017-11-25 LAB — I-STAT TROPONIN, ED: Troponin i, poc: 0.01 ng/mL (ref 0.00–0.08)

## 2017-11-25 LAB — DIFFERENTIAL
Basophils Absolute: 0 10*3/uL (ref 0.0–0.1)
Basophils Relative: 1 %
Eosinophils Absolute: 0.2 10*3/uL (ref 0.0–0.7)
Eosinophils Relative: 3 %
Lymphocytes Relative: 15 %
Lymphs Abs: 0.7 10*3/uL (ref 0.7–4.0)
Monocytes Absolute: 0.6 10*3/uL (ref 0.1–1.0)
Monocytes Relative: 12 %
Neutro Abs: 3.5 10*3/uL (ref 1.7–7.7)
Neutrophils Relative %: 69 %

## 2017-11-25 LAB — CBC
HCT: 40 % (ref 36.0–46.0)
Hemoglobin: 12.4 g/dL (ref 12.0–15.0)
MCH: 29.7 pg (ref 26.0–34.0)
MCHC: 31 g/dL (ref 30.0–36.0)
MCV: 95.9 fL (ref 78.0–100.0)
Platelets: 45 10*3/uL — ABNORMAL LOW (ref 150–400)
RBC: 4.17 MIL/uL (ref 3.87–5.11)
RDW: 17.6 % — ABNORMAL HIGH (ref 11.5–15.5)
WBC: 5.8 10*3/uL (ref 4.0–10.5)

## 2017-11-25 LAB — SEDIMENTATION RATE: Sed Rate: 5 mm/hr (ref 0–22)

## 2017-11-25 LAB — I-STAT CG4 LACTIC ACID, ED: Lactic Acid, Venous: 1.08 mmol/L (ref 0.5–1.9)

## 2017-11-25 LAB — HEPATIC FUNCTION PANEL
ALT: 15 U/L (ref 14–54)
AST: 48 U/L — ABNORMAL HIGH (ref 15–41)
Albumin: 2.5 g/dL — ABNORMAL LOW (ref 3.5–5.0)
Alkaline Phosphatase: 180 U/L — ABNORMAL HIGH (ref 38–126)
Bilirubin, Direct: 1 mg/dL — ABNORMAL HIGH (ref 0.1–0.5)
Indirect Bilirubin: 1.5 mg/dL — ABNORMAL HIGH (ref 0.3–0.9)
Total Bilirubin: 2.5 mg/dL — ABNORMAL HIGH (ref 0.3–1.2)
Total Protein: 5.8 g/dL — ABNORMAL LOW (ref 6.5–8.1)

## 2017-11-25 LAB — BASIC METABOLIC PANEL
Anion gap: 10 (ref 5–15)
BUN: 12 mg/dL (ref 6–20)
CO2: 22 mmol/L (ref 22–32)
Calcium: 8.4 mg/dL — ABNORMAL LOW (ref 8.9–10.3)
Chloride: 110 mmol/L (ref 101–111)
Creatinine, Ser: 0.63 mg/dL (ref 0.44–1.00)
GFR calc Af Amer: >60 mL/min (ref >60)
GFR calc non Af Amer: >60 mL/min (ref >60)
Glucose, Bld: 88 mg/dL (ref 65–99)
Potassium: 3.6 mmol/L (ref 3.5–5.1)
Sodium: 142 mmol/L (ref 135–145)

## 2017-11-25 LAB — C-REACTIVE PROTEIN: CRP: 4.3 mg/dL — ABNORMAL HIGH (ref <1.0)

## 2017-11-25 MED ORDER — HYDROCODONE-HOMATROPINE 5-1.5 MG/5ML PO SYRP: 5.0000 mL | ORAL_SOLUTION | Freq: Four times a day (QID) | ORAL | Status: DC | PRN

## 2017-11-25 MED ORDER — GABAPENTIN 100 MG PO CAPS
200.0000 mg | ORAL_CAPSULE | Freq: Three times a day (TID) | ORAL | Status: DC
  Administered 2017-11-25 – 2017-11-28 (×8): 200 mg via ORAL
  Filled 2017-11-25 (×8): qty 2

## 2017-11-25 MED ORDER — MORPHINE SULFATE (PF) 4 MG/ML IV SOLN
1.0000 mg | INTRAVENOUS | Status: DC | PRN
  Administered 2017-11-28: 1 mg via INTRAVENOUS
  Filled 2017-11-25: qty 1

## 2017-11-25 MED ORDER — POLYETHYLENE GLYCOL 3350 17 G PO PACK: 17.0000 g | PACK | Freq: Every day | ORAL | Status: DC | PRN

## 2017-11-25 MED ORDER — SENNOSIDES-DOCUSATE SODIUM 8.6-50 MG PO TABS
1.0000 | ORAL_TABLET | Freq: Two times a day (BID) | ORAL | Status: DC
  Administered 2017-11-25 – 2017-12-01 (×12): 1 via ORAL
  Filled 2017-11-25 (×12): qty 1

## 2017-11-25 MED ORDER — ZOLPIDEM TARTRATE 5 MG PO TABS
5.0000 mg | ORAL_TABLET | Freq: Every evening | ORAL | Status: DC | PRN
  Filled 2017-11-25: qty 1

## 2017-11-25 MED ORDER — OXYCODONE-ACETAMINOPHEN 5-325 MG PO TABS
1.0000 | ORAL_TABLET | ORAL | Status: DC | PRN
  Administered 2017-11-25: 1 via ORAL
  Filled 2017-11-25: qty 1

## 2017-11-25 MED ORDER — VANCOMYCIN HCL 10 G IV SOLR
1500.0000 mg | Freq: Once | INTRAVENOUS | Status: AC
  Administered 2017-11-26: 1500 mg via INTRAVENOUS
  Filled 2017-11-25 (×2): qty 1500

## 2017-11-25 MED ORDER — RIFAXIMIN 550 MG PO TABS
550.0000 mg | ORAL_TABLET | Freq: Two times a day (BID) | ORAL | Status: DC
  Administered 2017-11-25 – 2017-12-01 (×12): 550 mg via ORAL
  Filled 2017-11-25 (×12): qty 1

## 2017-11-25 MED ORDER — OXYCODONE HCL 5 MG PO TABS
5.0000 mg | ORAL_TABLET | Freq: Four times a day (QID) | ORAL | Status: DC | PRN
  Administered 2017-11-25 – 2017-11-28 (×6): 5 mg via ORAL
  Filled 2017-11-25 (×6): qty 1

## 2017-11-25 MED ORDER — MAGNESIUM OXIDE 400 (241.3 MG) MG PO TABS
200.0000 mg | ORAL_TABLET | Freq: Every day | ORAL | Status: DC
  Administered 2017-11-26 – 2017-12-01 (×6): 200 mg via ORAL
  Filled 2017-11-25 (×6): qty 1

## 2017-11-25 MED ORDER — VANCOMYCIN HCL IN DEXTROSE 750-5 MG/150ML-% IV SOLN
750.0000 mg | Freq: Two times a day (BID) | INTRAVENOUS | Status: DC
  Administered 2017-11-26 – 2017-11-29 (×6): 750 mg via INTRAVENOUS
  Filled 2017-11-25 (×7): qty 150

## 2017-11-25 MED ORDER — IBUPROFEN 200 MG PO TABS: 200.0000 mg | ORAL_TABLET | Freq: Four times a day (QID) | ORAL | Status: DC | PRN

## 2017-11-25 MED ORDER — LEVOTHYROXINE SODIUM 50 MCG PO TABS
50.0000 ug | ORAL_TABLET | Freq: Every day | ORAL | Status: DC
  Administered 2017-11-26 – 2017-11-28 (×3): 50 ug via ORAL
  Filled 2017-11-25 (×3): qty 1

## 2017-11-25 MED ORDER — ALBUTEROL SULFATE (2.5 MG/3ML) 0.083% IN NEBU
2.5000 mg | INHALATION_SOLUTION | Freq: Four times a day (QID) | RESPIRATORY_TRACT | Status: DC | PRN
  Administered 2017-11-26: 2.5 mg via RESPIRATORY_TRACT
  Filled 2017-11-25: qty 3

## 2017-11-25 MED ORDER — MORPHINE SULFATE (PF) 4 MG/ML IV SOLN
4.0000 mg | Freq: Once | INTRAVENOUS | Status: AC
  Administered 2017-11-25: 4 mg via INTRAVENOUS
  Filled 2017-11-25: qty 1

## 2017-11-25 MED ORDER — QUETIAPINE FUMARATE 25 MG PO TABS
25.0000 mg | ORAL_TABLET | Freq: Every day | ORAL | Status: DC
  Administered 2017-11-25 – 2017-11-30 (×6): 25 mg via ORAL
  Filled 2017-11-25 (×6): qty 1

## 2017-11-25 MED ORDER — LACTULOSE 10 GM/15ML PO SOLN
10.0000 g | Freq: Three times a day (TID) | ORAL | Status: DC
  Administered 2017-11-25 – 2017-11-29 (×8): 10 g via ORAL
  Filled 2017-11-25 (×11): qty 15

## 2017-11-25 MED ORDER — CLINDAMYCIN PHOSPHATE 900 MG/50ML IV SOLN
900.0000 mg | Freq: Once | INTRAVENOUS | Status: AC
  Administered 2017-11-25: 900 mg via INTRAVENOUS
  Filled 2017-11-25: qty 50

## 2017-11-25 MED ORDER — ONDANSETRON HCL 4 MG PO TABS: 4.0000 mg | ORAL_TABLET | Freq: Four times a day (QID) | ORAL | Status: DC | PRN

## 2017-11-25 MED ORDER — MAGIC MOUTHWASH W/LIDOCAINE
5.0000 mL | Freq: Four times a day (QID) | ORAL | Status: DC | PRN
  Administered 2017-11-25 – 2017-12-01 (×7): 5 mL via ORAL
  Filled 2017-11-25 (×7): qty 5

## 2017-11-25 MED ORDER — TRAZODONE HCL 50 MG PO TABS
50.0000 mg | ORAL_TABLET | Freq: Every day | ORAL | Status: DC
  Administered 2017-11-25 – 2017-11-30 (×6): 50 mg via ORAL
  Filled 2017-11-25 (×6): qty 1

## 2017-11-25 MED ORDER — SERTRALINE HCL 100 MG PO TABS
100.0000 mg | ORAL_TABLET | Freq: Every day | ORAL | Status: DC
  Administered 2017-11-25 – 2017-11-30 (×6): 100 mg via ORAL
  Filled 2017-11-25 (×6): qty 1

## 2017-11-25 MED ORDER — ONDANSETRON HCL 4 MG/2ML IJ SOLN: 4.0000 mg | Freq: Four times a day (QID) | INTRAMUSCULAR | Status: DC | PRN

## 2017-11-25 NOTE — H&P (Signed)
History and Physical    Bless Lisenby NLG:[id number] DOB: May 20, 1956 DOA: 11/25/2017  Referring MD/NP/PA:   PCP: System, Pcp Not In   Patient coming from:  The patient is coming from home.  At baseline, pt is independent for most of ADL.   Chief Complaint: pain in left groin area and left lower abdominal wall and SOB  HPI: Megan Roberson is a 62 y.o. female with medical history significant of liver cirrhosis, thrombocytopenia, hypothyroidism, depression, anxiety, hepatic encephalopathy, Ogilvie syndrome, who presents with pain in left groin area and left lower abdominal wall and SOB  Pt states that she had paracentesis done on 1/17 by interventional radiology. They removed 5 L of fluid.  2 days later, she developed pain in left groin area and left lower abdominal wall. The left lower abdomina wall is indurated and erythematous. The pain is constant, sharp, moderate, nonradiating. Patient does not have fever or chills. She has nausea, no abdominal pain, diarrhea or abdominal pain. She denies symptoms of UTI. Patient states that her abdominal distention has also worsened recently. She has shortness of breath and mild cough, no chest pain, runny nose, sore throat. No unilateral weakness.  ED Course: pt was found to have WBC 5.8, negative troponin, platelet of 48, electrolytes renal function okay, AST 48, ALT 15, total bilirubin 2.5. temperature normal, no tachycardia, oxygen saturation 90-94% on room air. Chest x-ray showedstable density in the right middle lobe, which is likely due to loculated pleural effusion per radiologist. Patient is admitted to [facility] bed as inpatient. GI, Dr. Ardis Hughs was consulted by EDP.  Review of Systems:   General: no fevers, chills, no body weight gain, has poor appetite, has fatigue HEENT: no blurry vision, hearing changes or sore throat Respiratory: has dyspnea, coughing, no wheezing CV: no chest pain, no palpitations GI: has nausea and abdominal distension. No  vomiting, abdominal pain, diarrhea, constipation GU: no dysuria, burning on urination, increased urinary frequency, hematuria  Ext: no leg edema Neuro: no unilateral weakness, numbness, or tingling, no vision change or hearing loss Skin: no rash, no skin tear. MSK: No muscle spasm, no deformity, no limitation of range of movement in spin Heme: No easy bruising.  Travel history: No recent long distant travel.  Allergy:  Allergies  Allergen Reactions  . Penicillins Swelling    FACIAL SWELLING  PATIENT HAS HAD A PCN REACTION WITH IMMEDIATE RASH, FACIAL/TONGUE/THROAT SWELLING, SOB, OR LIGHTHEADEDNESS WITH HYPOTENSION:  #  #  #  YES  #  #  #   Has patient had a PCN reaction causing severe rash involving mucus membranes or skin necrosis: No Has patient had a PCN reaction that required hospitalization: No Has patient had a PCN reaction occurring within the last 10 years: No If all of the above answers are "NO", then may proceed with Cephalosporin u  . Sulfa Antibiotics Itching and Rash    Past Medical History:  Diagnosis Date  . Anxiety   . Cirrhosis of liver ([place])   . Depression   . Heart murmur    "not concerned about "   . History of abdominal paracentesis   . History of blood transfusion   . Hypothyroidism   . Ileus ([place])   . Liver disease 2008  . Neuropathy   . Pancytopenia ([place])   . Pneumonia     Past Surgical History:  Procedure Laterality Date  . COLONOSCOPY WITH ESOPHAGOGASTRODUODENOSCOPY (EGD) AND ESOPHAGEAL DILATION (ED)    . ESOPHAGOGASTRODUODENOSCOPY W/ BANDING  Varices  . FRACTURE SURGERY Left    x 2  . HARDWARE REMOVAL Left 09/17/2017   Procedure: HARDWARE REMOVAL LEFT OLECRANON;  Surgeon: Altamese Chehalis, MD;  Location: [facility];  Service: Orthopedics;  Laterality: Left;  . HIP SURGERY Left    from fracture- rod in palce  . IR PARACENTESIS  11/18/2017  . KNEE SURGERY Bilateral    open incision for ligaments  . ORIF ELBOW FRACTURE Left 04/30/2017    Procedure: REVISION OPEN REDUCTION INTERNAL FIXATION (ORIF) ELBOW/OLECRANON FRACTURE;  Surgeon: Altamese H[place]au[place], MD;  Location: [facility];  Service: Orthopedics;  Laterality: Left;    Social History:  reports that  has never smoked. she has never used smokeless tobacco. She reports that she does not drink alcohol or use drugs.  Family History:  Family History  Adopted: Yes     Prior to Admission medications   Medication Sig Start Date End Date Taking? Authorizing Provider  clotrimazole (MYCELEX) 10 MG troche Take 1 tablet (10 mg total) by mouth 5 (five) times daily. 11/16/17   Charlesetta Shanks, MD  gabapentin (NEURONTIN) 100 MG capsule Take 2 capsules (200 mg total) by mouth 3 (three) times daily. 11/09/17   Cherene Altes, MD  HYDROcodone-homatropine (HYCODAN) 5-1.5 MG/5ML syrup 5-10 mL every 6 hours as needed for cough. 11/16/17   Charlesetta Shanks, MD  ibuprofen (ADVIL,MOTRIN) 200 MG tablet Take 1 tablet (200 mg total) by mouth every 6 (six) hours as needed for fever, headache or mild pain. 11/09/17   Cherene Altes, MD  lactulose (CHRONULAC) 10 GM/15ML solution Take 15 mLs (10 g total) by mouth 3 (three) times daily. Patient not taking: Reported on 11/16/2017 11/09/17   Cherene Altes, MD  levofloxacin (LEVAQUIN) 750 MG tablet Take 1 tablet (750 mg total) by mouth daily. X 7 days 11/16/17   Charlesetta Shanks, MD  levothyroxine (SYNTHROID, LEVOTHROID) 50 MCG tablet Take 50 mcg daily before breakfast by mouth.     [provider]  magic mouthwash w/lidocaine SOLN Take 5 mLs by mouth 4 (four) times daily as needed for mouth pain. Patient not taking: Reported on 11/16/2017 11/09/17   Cherene Altes, MD  Magnesium 250 MG TABS Take 250 mg by mouth daily.    [provider]  ondansetron (ZOFRAN ODT) 4 MG disintegrating tablet Take 1 tablet (4 mg total) every 8 (eight) hours as needed by mouth for nausea or vomiting. 09/17/17   Ainsley Spinner, PA-C  oxyCODONE (OXY IR/ROXICODONE) 5 MG  immediate release tablet Take 1 tablet (5 mg total) by mouth every 6 (six) hours as needed for severe pain. Patient not taking: Reported on 11/16/2017 11/09/17   Cherene Altes, MD  polyethylene glycol [facility] / Floria Raveling) packet Take 17 g by mouth daily.    [provider]  potassium chloride SA (K-DUR,KLOR-CON) 20 MEQ tablet Take 1 tablet (20 mEq total) by mouth daily. 11/10/17   Cherene Altes, MD  QUEtiapine (SEROQUEL) 25 MG tablet Take 25 mg by mouth at bedtime.    [provider]  rifaximin (XIFAXAN) 550 MG TABS tablet Take 1 tablet (550 mg total) by mouth 2 (two) times daily. 11/09/17   Cherene Altes, MD  senna-docusate (SENOKOT-S) 8.6-50 MG tablet Take 1 tablet by mouth 2 (two) times daily. Patient not taking: Reported on 11/16/2017 11/09/17   Cherene Altes, MD  sertraline (ZOLOFT) 100 MG tablet Take 100 mg at bedtime by mouth.    [provider]  traZODone (DESYREL)  50 MG tablet Take 50 mg at bedtime by mouth.    [provider]    Physical Exam: Vitals:   11/25/17 1830 11/25/17 1930 11/25/17 2000 11/25/17 2030  BP: 109/73 114/72 132/74 127/82  Pulse: 75 71 69 73  Resp: _0 Temp:      TempSrc:      SpO2: 90% 90% 91% 94%  Weight:      Height:       General: Not in acute distress HEENT:       Eyes: PERRL, EOMI, no scleral icterus.       ENT: No discharge from the ears and nose, no pharynx injection, no tonsillar enlargement.        Neck: No JVD, no bruit, no mass felt. Heme: No neck lymph node enlargement. Cardiac: S1/S2, RRR, No murmurs, No gallops or rubs. Respiratory: Good air movement bilaterally. No rales, wheezing, rhonchi or rubs. GI: distended. There is tenderness in the left lower abdominal wall. No organomegaly, BS present. GU: No hematuria Ext: No pitting leg edema bilaterally. 2+DP/PT pulse bilaterally. Musculoskeletal: No joint deformities, No joint redness or warmth, no limitation of ROM in spin. Skin: there  is tenderness, erythema, warmth in the left lower abdominal wall and left groin area.  Neuro: Alert, oriented X3, cranial nerves II-XII grossly intact, moves all extremities normally. Psych: Patient is not psychotic, no suicidal or hemocidal ideation.  Labs on Admission: I have personally reviewed following labs and imaging studies  CBC: Recent Labs  Lab 11/25/17 1539 11/25/17 2049  WBC 5.8  --   NEUTROABS  --  3.5  HGB 12.4  --   HCT 40.0  --   MCV 95.9  --   PLT 45*  --    Basic Metabolic Panel: Recent Labs  Lab 11/25/17 1539  NA 142  K 3.6  CL 110  CO2 22  GLUCOSE 88  BUN 12  CREATININE 0.63  CALCIUM 8.4*   GFR: Estimated Creatinine Clearance: 73.6 mL/min (by C-G formula based on SCr of 0.63 mg/dL). Liver Function Tests: Recent Labs  Lab 11/25/17 1539  AST 48*  ALT 15  ALKPHOS 180*  BILITOT 2.5*  PROT 5.8*  ALBUMIN 2.5*   No results for input(s): LIPASE, AMYLASE in the last 168 hours. No results for input(s): AMMONIA in the last 168 hours. Coagulation Profile: No results for input(s): INR, PROTIME in the last 168 hours. Cardiac Enzymes: No results for input(s): CKTOTAL, CKMB, CKMBINDEX, TROPONINI in the last 168 hours. BNP (last 3 results) No results for input(s): PROBNP in the last 8760 hours. HbA1C: No results for input(s): HGBA1C in the last 72 hours. CBG: No results for input(s): GLUCAP in the last 168 hours. Lipid Profile: No results for input(s): CHOL, HDL, LDLCALC, TRIG, CHOLHDL, LDLDIRECT in the last 72 hours. Thyroid Function Tests: No results for input(s): TSH, T4TOTAL, FREET4, T3FREE, THYROIDAB in the last 72 hours. Anemia Panel: No results for input(s): VITAMINB12, FOLATE, FERRITIN, TIBC, IRON, RETICCTPCT in the last 72 hours. Urine analysis:    Component Value Date/Time   COLORURINE AMBER (A) 11/16/2017 1740   APPEARANCEUR HAZY (A) 11/16/2017 1740   LABSPEC 1.033 (H) 11/16/2017 1740   PHURINE 6.0 11/16/2017 1740   GLUCOSEU NEGATIVE  11/16/2017 1740   HGBUR LARGE (A) 11/16/2017 1740   BILIRUBINUR SMALL (A) 11/16/2017 1740   KETONESUR 5 (A) 11/16/2017 1740   PROTEINUR 100 (A) 11/16/2017 1740   NITRITE NEGATIVE 11/16/2017 1740   LEUKOCYTESUR NEGATIVE  11/16/2017 1740   Sepsis Labs: _0 (procalcitonin:4,lacticidven:4) )No results found for this or any previous visit (from the past 240 hour(s)).   Radiological Exams on Admission: Dg Chest 2 View  Result Date: 11/25/2017 CLINICAL DATA:  Shortness of Breath EXAM: CHEST  2 VIEW COMPARISON:  11/16/2017 FINDINGS: Cardiac shadow is stable. The lungs are well aerated bilaterally. There is again identified soft tissue density over the right mid lung projecting posteriorly in the right lower lobe. Additionally a small right-sided pleural effusion is noted. The overall appearance is stable when compared with the prior examination. Improved aeration within the right lung is noted. The left lung is clear. No bony abnormality is noted. IMPRESSION: Persistent density overlying the right mid lung which has been stable over multiple previous exams and likely represents a loculated area of pleural fluid. Improved aeration in the right lung is noted when compare with the prior exam. Noncontrast CT may be helpful to establish a baseline for this rounded density in the right mid lung. This could be performed on a nonemergent basis as it is been stable over multiple previous exams. Electronically Signed   By: Inez Catalina M.D.   On: 11/25/2017 17:58     EKG: Independently reviewed.  Sinus rhythm, QTC 452, low voltage, Q waves in III. Assessment/Plan Principal Problem:   Cellulitis, abdominal wall Active Problems:   Cirrhosis of liver (H[facility])   Thrombocytopenia (H[facility])   Ascites   SOB (shortness of breath)   Cellulitis, abdominal wall: pt has tenderness, erythema, warmth, induration in the left lower abdominal wall, clinically consistent with cellulitis. ED physician consulted GI, Dr.  Ardis Hughs, who recommended to treat patient empirically with antibiotics for cellulitis. Patient does not meet criteria for sepsis. No fever or leukocytosis.  - will admit to med surg bed as inpt - Empiric antimicrobial treatment with vancomycin (pt received one dose of clindamycin in ED)  - PRN Zofran for nausea, morphine and oxcodon for pain - Blood cultures x 2  - ESR and CRP - will check lactic acid levels   Cirrhosis of liver and ascites: pt did not take her lasix  lactulose for more than a week. Mental status normal. -will resume lactulose -check ammonia level -check INR/PTT -will not start lasix now due to cellulitis and risk of developing sepsis  Thrombocytopenia ([place]):  Platelet 45, No active bleeding tendency. This is a due to liver cirrhosis. -Follow-up by CBC  SOB (shortness of breath): Likely due to abdominal distention secondary to ascites -pt may need paracentesis again, but this should be done after cellulitis is controlled  -albuterol nebs prn   DVT ppx: SCD Code Status: Full code Family Communication: Yes, patient's husband at bed side Disposition Plan:  Anticipate discharge back to previous home environment Consults called:  GI, Dr. Ardis Hughs Admission status: medical floor/inpt  Date of Service 11/25/2017   Ivor Costa [facility] Pager [phone]  If 7PM-7AM, please contact night-coverage [email or web address] Password [facility] 11/25/2017, 10:03 PM

## 2017-11-25 NOTE — ED Triage Notes (Signed)
Per Pt, Pt is coming from home with complaints of SOB, increased fluid in her abdomen about a week ago. Pt had fluid taken off her stomach one week ago. Complains of left groin pain.

## 2017-11-25 NOTE — ED Provider Notes (Signed)
[facility] EMERGENCY DEPARTMENT Provider Note   CSN: [id number] Arrival date & time: 11/25/17  1530     History   Chief Complaint Chief Complaint  Patient presents with  . Shortness of Breath    HPI Megan Roberson is a 62 y.o. female.  HPI Has increasing pain and redness on the left side of her abdomen.  This started about 1-2 days after a paracentesis done on 1\17 by interventional radiology.  They removed 5 L of fluid.  She reports it is a large area developed is very painful and swollen on her side.  She reports it radiates into her left groin and makes it hard for her to walk because of pain that she experiences with standing up and putting weight on the leg.  She reports that her left leg and ankle get weak and her left ankle rolls in but this has been a problem since a fall although this is exacerbated now by the pain that she is experiencing.  She has not had a fever.  No nausea no vomiting. Past Medical History:  Diagnosis Date  . Anxiety   . Cirrhosis of liver ([place])   . Depression   . Heart murmur    "not concerned about "   . History of abdominal paracentesis   . History of blood transfusion   . Hypothyroidism   . Ileus ([place])   . Liver disease 2008  . Neuropathy   . Pancytopenia ([place])   . Pneumonia     Patient Active Problem List   Diagnosis Date Noted  . Ascites 11/25/2017  . Cellulitis, abdominal wall 11/25/2017  . SOB (shortness of breath) 11/25/2017  . Protein-calorie malnutrition, severe 11/06/2017  . Ogilvie's syndrome   . Abdominal distension   . Abdominal pain   . Liver failure without hepatic coma ([place])   . Thrombocytopenia ([place])   . Closed fracture of lumbar spine without lesion of spinal cord ([place])   . Cirrhosis of liver ([place]) 04/21/2017  . Anemia 04/21/2017  . History of open reduction and internal fixation (ORIF) procedure 04/21/2017  . Visit for suture removal 04/21/2017  . Left Olecranon fracture 04/21/2017  .  Hypokalemia 04/21/2017    Past Surgical History:  Procedure Laterality Date  . COLONOSCOPY WITH ESOPHAGOGASTRODUODENOSCOPY (EGD) AND ESOPHAGEAL DILATION (ED)    . ESOPHAGOGASTRODUODENOSCOPY W/ BANDING     Varices  . FRACTURE SURGERY Left    x 2  . HARDWARE REMOVAL Left 09/17/2017   Procedure: HARDWARE REMOVAL LEFT OLECRANON;  Surgeon: Altamese [place], MD;  Location: [facility];  Service: Orthopedics;  Laterality: Left;  . HIP SURGERY Left    from fracture- rod in palce  . IR PARACENTESIS  11/18/2017  . IR PARACENTESIS  11/26/2017  . KNEE SURGERY Bilateral    open incision for ligaments  . ORIF ELBOW FRACTURE Left 04/30/2017   Procedure: REVISION OPEN REDUCTION INTERNAL FIXATION (ORIF) ELBOW/OLECRANON FRACTURE;  Surgeon: Altamese Kemah, MD;  Location: [facility];  Service: Orthopedics;  Laterality: Left;    OB History    No data available       Home Medications    Prior to Admission medications   Medication Sig Start Date End Date Taking? Authorizing Provider  gabapentin (NEURONTIN) 100 MG capsule Take 2 capsules (200 mg total) by mouth 3 (three) times daily. 11/09/17  Yes Cherene Altes, MD  levothyroxine (SYNTHROID, LEVOTHROID) 50 MCG tablet Take 50 mcg daily before breakfast by mouth.    Yes [provider]  polyethylene glycol (MIRALAX / GLYCOLAX) packet Take 17 g by mouth daily.   Yes [provider]  potassium chloride SA (K-DUR,KLOR-CON) 20 MEQ tablet Take 1 tablet (20 mEq total) by mouth daily. 11/10/17  Yes Cherene Altes, MD  QUEtiapine (SEROQUEL) 25 MG tablet Take 25 mg by mouth daily.    Yes [provider]  rifaximin (XIFAXAN) 550 MG TABS tablet Take 1 tablet (550 mg total) by mouth 2 (two) times daily. 11/09/17  Yes Cherene Altes, MD  sertraline (ZOLOFT) 100 MG tablet Take 100 mg by mouth daily.    Yes [provider]  traZODone (DESYREL) 50 MG tablet Take 50 mg at bedtime by mouth.   Yes [provider]  magic mouthwash  w/lidocaine SOLN Take 5 mLs by mouth 4 (four) times daily as needed for mouth pain. Patient not taking: Reported on 11/16/2017 11/09/17   Cherene Altes, MD    Family History Family History  Adopted: Yes    Social History Social History   Tobacco Use  . Smoking status: Never Smoker  . Smokeless tobacco: Never Used  Substance Use Topics  . Alcohol use: No  . Drug use: No     Allergies   Penicillins and Sulfa antibiotics   Review of Systems Review of Systems 10 Systems reviewed and are negative for acute change except as noted in the HPI.   Physical Exam Updated Vital Signs BP (!) 96/53 (BP Location: Right Arm)   Pulse 65   Temp 99.5 F (37.5 C) (Oral)   Resp 20   Ht 5' 7"  (1.702 m)   Wt 56.7 kg (125 lb 0 oz)   SpO2 93%   BMI 19.58 kg/m   Physical Exam         ED Treatments / Results  Labs (all labs ordered are listed, but only abnormal results are displayed) Labs Reviewed  BASIC METABOLIC PANEL - Abnormal; Notable for the following components:      Result Value   Calcium 8.4 (*)    All other components within normal limits  CBC - Abnormal; Notable for the following components:   RDW 17.6 (*)    Platelets 45 (*)    All other components within normal limits  HEPATIC FUNCTION PANEL - Abnormal; Notable for the following components:   Total Protein 5.8 (*)    Albumin 2.5 (*)    AST 48 (*)    Alkaline Phosphatase 180 (*)    Total Bilirubin 2.5 (*)    Bilirubin, Direct 1.0 (*)    Indirect Bilirubin 1.5 (*)    All other components within normal limits  AMMONIA - Abnormal; Notable for the following components:   Ammonia 54 (*)    All other components within normal limits  PROTIME-INR - Abnormal; Notable for the following components:   Prothrombin Time 17.9 (*)    All other components within normal limits  APTT - Abnormal; Notable for the following components:   aPTT 44 (*)    All other components within normal limits  CBC - Abnormal; Notable  for the following components:   RBC 3.70 (*)    Hemoglobin 11.2 (*)    HCT 35.7 (*)    RDW 17.8 (*)    Platelets 44 (*)    All other components within normal limits  TSH - Abnormal; Notable for the following components:   TSH 18.008 (*)    All other components within normal limits  C-REACTIVE PROTEIN -  Abnormal; Notable for the following components:   CRP 4.3 (*)    All other components within normal limits  COMPREHENSIVE METABOLIC PANEL - Abnormal; Notable for the following components:   Potassium 3.2 (*)    CO2 19 (*)    Glucose, Bld 121 (*)    Calcium 7.9 (*)    Total Protein 4.9 (*)    Albumin 2.4 (*)    Alkaline Phosphatase 155 (*)    Total Bilirubin 2.5 (*)    All other components within normal limits  BASIC METABOLIC PANEL - Abnormal; Notable for the following components:   Potassium 3.2 (*)    Calcium 8.1 (*)    All other components within normal limits  AMMONIA - Abnormal; Notable for the following components:   Ammonia 52 (*)    All other components within normal limits  COMPREHENSIVE METABOLIC PANEL - Abnormal; Notable for the following components:   Potassium 3.2 (*)    Calcium 8.1 (*)    Total Protein 5.3 (*)    Albumin 2.6 (*)    Alkaline Phosphatase 158 (*)    Total Bilirubin 2.1 (*)    All other components within normal limits  CBC - Abnormal; Notable for the following components:   RBC 3.65 (*)    Hemoglobin 10.9 (*)    HCT 34.0 (*)    RDW 17.0 (*)    Platelets 42 (*)    All other components within normal limits  CBC - Abnormal; Notable for the following components:   RBC 3.42 (*)    Hemoglobin 10.1 (*)    HCT 31.9 (*)    RDW 16.9 (*)    Platelets 46 (*)    All other components within normal limits  AMMONIA - Abnormal; Notable for the following components:   Ammonia 75 (*)    All other components within normal limits  COMPREHENSIVE METABOLIC PANEL - Abnormal; Notable for the following components:   Chloride 100 (*)    Calcium 8.1 (*)    Total  Protein 5.1 (*)    Albumin 2.4 (*)    Alkaline Phosphatase 162 (*)    Total Bilirubin 1.7 (*)    All other components within normal limits  AMMONIA - Abnormal; Notable for the following components:   Ammonia 63 (*)    All other components within normal limits  CULTURE, BLOOD (ROUTINE X 2)  CULTURE, BLOOD (ROUTINE X 2)  DIFFERENTIAL  SEDIMENTATION RATE  I-STAT TROPONIN, ED  I-STAT CG4 LACTIC ACID, ED  I-STAT CG4 LACTIC ACID, ED    EKG  EKG Interpretation  Date/Time:  Thursday November 25 2017 15:37:46 EST Ventricular Rate:  77 PR Interval:  146 QRS Duration: 70 QT Interval:  400 QTC Calculation: 452 R Axis:   41 Text Interpretation:  Normal sinus rhythm Nonspecific T wave abnormality No significant change since last tracing Confirmed by Lajean Saver [phone]) on 11/26/2017 3:55:31 PM       Radiology No results found.  Procedures Procedures (including critical care time)  Medications Ordered in ED Medications  morphine 4 MG/ML injection 1 mg (1 mg Intravenous Given 11/28/17 2151)  HYDROcodone-homatropine (HYCODAN) 5-1.5 MG/5ML syrup 5 mL (not administered)  ibuprofen (ADVIL,MOTRIN) tablet 200 mg (not administered)  magic mouthwash w/lidocaine (5 mLs Oral Given 11/30/17 1014)  magnesium oxide (MAG-OX) tablet 200 mg (200 mg Oral Given 11/30/17 0930)  QUEtiapine (SEROQUEL) tablet 25 mg (25 mg Oral Given 11/29/17 2113)  rifaximin (XIFAXAN) tablet 550 mg (550 mg Oral Given 11/30/17 0932)  senna-docusate (Senokot-S) tablet 1 tablet (1 tablet Oral Given 11/30/17 0932)  sertraline (ZOLOFT) tablet 100 mg (100 mg Oral Given 11/29/17 2113)  traZODone (DESYREL) tablet 50 mg (50 mg Oral Given 11/29/17 2113)  ondansetron (ZOFRAN) tablet 4 mg (not administered)    Or  ondansetron (ZOFRAN) injection 4 mg (not administered)  zolpidem (AMBIEN) tablet 5 mg (not administered)  albuterol (PROVENTIL) (2.5 MG/3ML) 0.083% nebulizer solution 2.5 mg (2.5 mg Nebulization Given 11/26/17 1447)  lidocaine  20 MG/ML injection (not administered)  lidocaine (PF) (XYLOCAINE) 1 % injection (10 mLs Infiltration Given 11/26/17 1643)  feeding supplement (ENSURE ENLIVE) (ENSURE ENLIVE) liquid 237 mL (237 mLs Oral Not Given 11/30/17 1402)  multivitamin with minerals tablet 1 tablet (1 tablet Oral Given 11/30/17 0929)  polyethylene glycol (MIRALAX / GLYCOLAX) packet 17 g (17 g Oral Given 11/30/17 0934)  oxyCODONE (Oxy IR/ROXICODONE) immediate release tablet 10 mg (10 mg Oral Given 11/30/17 1423)  gabapentin (NEURONTIN) capsule 300 mg (300 mg Oral Given 11/30/17 1532)  potassium chloride SA (K-DUR,KLOR-CON) CR tablet 20 mEq (20 mEq Oral Given 11/30/17 0930)  levothyroxine (SYNTHROID, LEVOTHROID) tablet 75 mcg (75 mcg Oral Given 11/30/17 0932)  lactulose (CHRONULAC) 10 GM/15ML solution 20 g (20 g Oral Given 11/30/17 1532)  clindamycin (CLEOCIN) capsule 150 mg (150 mg Oral Given 11/30/17 1423)  furosemide (LASIX) tablet 80 mg (80 mg Oral Given 11/30/17 0931)  spironolactone (ALDACTONE) tablet 200 mg (200 mg Oral Given 11/30/17 0949)  morphine 4 MG/ML injection 4 mg (4 mg Intravenous Given 11/25/17 2011)  clindamycin (CLEOCIN) IVPB 900 mg (0 mg Intravenous Stopped 11/25/17 2206)  vancomycin (VANCOCIN) 1,500 mg in sodium chloride 0.9 % 500 mL IVPB (1,500 mg Intravenous New Bag/Given 11/26/17 0045)  albumin human 25 % solution 25 g (25 g Intravenous New Bag/Given 11/26/17 1830)  white petrolatum (VASELINE) gel (  Given 11/26/17 1309)  potassium chloride SA (K-DUR,KLOR-CON) CR tablet 40 mEq (40 mEq Oral Given 11/27/17 2132)  iopamidol (ISOVUE-300) 61 % injection (100 mLs  Contrast Given 11/28/17 0350)  iopamidol (ISOVUE-300) 61 % injection (  Contrast Given 11/28/17 0053)  potassium chloride SA (K-DUR,KLOR-CON) CR tablet 40 mEq (40 mEq Oral Given 11/28/17 2140)     Initial Impression / Assessment and Plan / ED Course  I have reviewed the triage vital signs and the nursing notes.  Pertinent labs & imaging results that were  available during my care of the patient were reviewed by me and considered in my medical decision making (see chart for details).    Consult: Reviewed with GI, recommendation for admission and empiric treatment for abdominal wall cellulitis. Consult: [facility] hospitalist  Final Clinical Impressions(s) / ED Diagnoses   Final diagnoses:  Cellulitis of abdominal wall  Alcoholic cirrhosis of liver with ascites ([place])  Patient presents as outlined above status post paracentesis.  She has large area of abdominal wall erythema and swelling that is painful.  Patient is empirically started on treatment for cellulitis.  At this time I do not suspect SBP.  She is alert and nontoxic without fever.  ED Discharge Orders    None       Charlesetta Shanks, MD 11/30/17 1715

## 2017-11-25 NOTE — Progress Notes (Signed)
Pharmacy Antibiotic Note  Megan Roberson is a 62 y.o. female admitted on 11/25/2017 with cellulitis.  Pharmacy has been consulted for vancomycin dosing. Pt is afebrile. Lactate and WBC WNL. Scr also WNL (calculated CrCl ~80).  Plan: Vancomycin 1537m IV x1 Vancomycin 7539mIV q12h. Goal trough 10-15 mcg/mL F/u renal fxn, trough @ SS, clinical resolution  Height: 5' 7"  (170.2 cm) Weight: 156 lb (70.8 kg) IBW/kg (Calculated) : 61.6  Temp (24hrs), Avg:98 F (36.7 C), Min:98 F (36.7 C), Max:98 F (36.7 C)  Recent Labs  Lab 11/25/17 1539 11/25/17 2111  WBC 5.8  --   CREATININE 0.63  --   LATICACIDVEN  --  1.08    Estimated Creatinine Clearance: 73.6 mL/min (by C-G formula based on SCr of 0.63 mg/dL).    Allergies  Allergen Reactions  . Penicillins Swelling    FACIAL SWELLING  PATIENT HAS HAD A PCN REACTION WITH IMMEDIATE RASH, FACIAL/TONGUE/THROAT SWELLING, SOB, OR LIGHTHEADEDNESS WITH HYPOTENSION:  #  #  #  YES  #  #  #   Has patient had a PCN reaction causing severe rash involving mucus membranes or skin necrosis: No Has patient had a PCN reaction that required hospitalization: No Has patient had a PCN reaction occurring within the last 10 years: No If all of the above answers are "NO", then may proceed with Cephalosporin u  . Sulfa Antibiotics Itching and Rash    Antimicrobials this admission: Vacn 1/24 >>  Microbiology results: Pending  Thank you for allowing pharmacy to be a part of this patient's care.  Shomari Scicchitano 11/25/2017 10:23 PM

## 2017-11-26 ENCOUNTER — Other Ambulatory Visit: Payer: Self-pay

## 2017-11-26 ENCOUNTER — Inpatient Hospital Stay (HOSPITAL_COMMUNITY): Payer: Medicare HMO

## 2017-11-26 ENCOUNTER — Encounter (HOSPITAL_COMMUNITY): Payer: Self-pay | Admitting: Interventional Radiology

## 2017-11-26 DIAGNOSIS — D638 Anemia in other chronic diseases classified elsewhere: Secondary | ICD-10-CM

## 2017-11-26 HISTORY — PX: IR PARACENTESIS: IMG2679

## 2017-11-26 LAB — COMPREHENSIVE METABOLIC PANEL
ALT: 14 U/L (ref 14–54)
AST: 40 U/L (ref 15–41)
Albumin: 2.4 g/dL — ABNORMAL LOW (ref 3.5–5.0)
Alkaline Phosphatase: 155 U/L — ABNORMAL HIGH (ref 38–126)
Anion gap: 10 (ref 5–15)
BUN: 15 mg/dL (ref 6–20)
CO2: 19 mmol/L — ABNORMAL LOW (ref 22–32)
Calcium: 7.9 mg/dL — ABNORMAL LOW (ref 8.9–10.3)
Chloride: 111 mmol/L (ref 101–111)
Creatinine, Ser: 0.65 mg/dL (ref 0.44–1.00)
GFR calc Af Amer: >60 mL/min (ref >60)
GFR calc non Af Amer: >60 mL/min (ref >60)
Glucose, Bld: 121 mg/dL — ABNORMAL HIGH (ref 65–99)
Potassium: 3.2 mmol/L — ABNORMAL LOW (ref 3.5–5.1)
Sodium: 140 mmol/L (ref 135–145)
Total Bilirubin: 2.5 mg/dL — ABNORMAL HIGH (ref 0.3–1.2)
Total Protein: 4.9 g/dL — ABNORMAL LOW (ref 6.5–8.1)

## 2017-11-26 LAB — CBC
HCT: 35.7 % — ABNORMAL LOW (ref 36.0–46.0)
Hemoglobin: 11.2 g/dL — ABNORMAL LOW (ref 12.0–15.0)
MCH: 30.3 pg (ref 26.0–34.0)
MCHC: 31.4 g/dL (ref 30.0–36.0)
MCV: 96.5 fL (ref 78.0–100.0)
Platelets: 44 10*3/uL — ABNORMAL LOW (ref 150–400)
RBC: 3.7 MIL/uL — ABNORMAL LOW (ref 3.87–5.11)
RDW: 17.8 % — ABNORMAL HIGH (ref 11.5–15.5)
WBC: 4.7 10*3/uL (ref 4.0–10.5)

## 2017-11-26 LAB — PROTIME-INR
INR: 1.49
Prothrombin Time: 17.9 seconds — ABNORMAL HIGH (ref 11.4–15.2)

## 2017-11-26 LAB — TSH: TSH: 18.008 u[IU]/mL — ABNORMAL HIGH (ref 0.350–4.500)

## 2017-11-26 LAB — AMMONIA: Ammonia: 54 umol/L — ABNORMAL HIGH (ref 9–35)

## 2017-11-26 LAB — APTT: aPTT: 44 seconds — ABNORMAL HIGH (ref 24–36)

## 2017-11-26 MED ORDER — FUROSEMIDE 40 MG PO TABS
40.0000 mg | ORAL_TABLET | Freq: Every day | ORAL | Status: DC
  Administered 2017-11-26 – 2017-11-29 (×4): 40 mg via ORAL
  Filled 2017-11-26 (×4): qty 1

## 2017-11-26 MED ORDER — LIDOCAINE 2% (20 MG/ML) 5 ML SYRINGE
INTRAMUSCULAR | Status: AC
  Filled 2017-11-26: qty 10

## 2017-11-26 MED ORDER — WHITE PETROLATUM EX OINT
TOPICAL_OINTMENT | CUTANEOUS | Status: AC
  Administered 2017-11-26: 13:00:00
  Filled 2017-11-26: qty 28.35

## 2017-11-26 MED ORDER — ADULT MULTIVITAMIN W/MINERALS CH
1.0000 | ORAL_TABLET | Freq: Every day | ORAL | Status: DC
  Administered 2017-11-26 – 2017-12-01 (×6): 1 via ORAL
  Filled 2017-11-26 (×6): qty 1

## 2017-11-26 MED ORDER — ENSURE ENLIVE PO LIQD
237.0000 mL | Freq: Three times a day (TID) | ORAL | Status: DC
  Administered 2017-11-27 – 2017-12-01 (×11): 237 mL via ORAL

## 2017-11-26 MED ORDER — LIDOCAINE HCL (PF) 1 % IJ SOLN
INTRAMUSCULAR | Status: DC | PRN
  Administered 2017-11-26: 10 mL

## 2017-11-26 MED ORDER — ENSURE ENLIVE PO LIQD
237.0000 mL | Freq: Two times a day (BID) | ORAL | Status: DC
  Administered 2017-11-26: 237 mL via ORAL

## 2017-11-26 MED ORDER — SPIRONOLACTONE 25 MG PO TABS
100.0000 mg | ORAL_TABLET | Freq: Every day | ORAL | Status: DC
  Administered 2017-11-26 – 2017-11-29 (×4): 100 mg via ORAL
  Filled 2017-11-26 (×4): qty 4

## 2017-11-26 MED ORDER — ALBUMIN HUMAN 25 % IV SOLN
25.0000 g | Freq: Once | INTRAVENOUS | Status: AC
  Administered 2017-11-26: 25 g via INTRAVENOUS
  Filled 2017-11-26: qty 50

## 2017-11-26 NOTE — Progress Notes (Signed)
   11/26/17 1000  Clinical Encounter Type  Visited With Patient  Visit Type Initial  Referral From Patient  Consult/Referral To Chaplain  Spiritual Encounters  Spiritual Needs Emotional;Prayer  Stress Factors  Patient Stress Factors Health changes   Responded to a SCC for Prayer.  Patient was admitted yesterday.  She shared some about her health over the last 6 months and all that she has been through.  Talked about pain being a big issue.  She wishes for some independence and not having to rely on her husband for every little thing, but believe the husband is a good support for her.  We prayed together.  Will follow as needed. Chaplain Katherene Ponto

## 2017-11-26 NOTE — Progress Notes (Signed)
Marked patient's abdominal area where it is red and swollen to monitor if increasing in size. Patient consent obtained and verbalized understanding. Alert and orientedX4.

## 2017-11-26 NOTE — Procedures (Signed)
PROCEDURE SUMMARY:  Successful US guided paracentesis from right lateral abdomen.  Yielded 2.2 liters of clear, yellow fluid.  No immediate complications.  Pt tolerated well.   Specimen was not sent for labs.  Docia Barrier PA-C 11/26/2017 4:50 PM

## 2017-11-26 NOTE — Consult Note (Signed)
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  Her last paracentesis was with IR at [facility] and 5 liters of ascites was removed on 11/18/2017.  She reports having left sided abdominal pain two days after the paracentesis.  Examination here in the hospital demonstrated a hard and indurated region in her left abdominal wall consistent with a cellulitis.  The patient is a poor historian.  He moved from [place] secondary to housing cost issues and her sister lives here in [place], [place].  She reports excellent control of her ascites when she was in [place] and she did not have a paracentesis for 3 years prior to moving to [facility]  Admittedly she states that she has not been compliant with her diet, I.e., eating more salt.  She takes lasix and spironolactone, but she does not know the dosing.  Previously she was seen in December by Dr. Loletha Carrow as an unassigned patient in the hospital and she was noted to have Olgivie's syndrome.  The records show that she was evaluated by [facility].    Past Medical History:  Diagnosis Date  . Anxiety   . Cirrhosis of liver ([place])   . Depression   . Heart murmur    "not concerned about "   . History of abdominal paracentesis   . History of blood transfusion   . Hypothyroidism   . Ileus ([place])   . Liver disease 2008  . Neuropathy   . Pancytopenia ([place])   . Pneumonia     Past Surgical History:  Procedure Laterality Date  . COLONOSCOPY WITH ESOPHAGOGASTRODUODENOSCOPY (EGD) AND ESOPHAGEAL DILATION (ED)    . ESOPHAGOGASTRODUODENOSCOPY W/ BANDING     Varices  . FRACTURE SURGERY Left    x 2  . HARDWARE REMOVAL Left 09/17/2017   Procedure: HARDWARE REMOVAL LEFT OLECRANON;  Surgeon: Altamese Sheldon, MD;  Location: [employer];  Service: Orthopedics;  Laterality: Left;  . HIP SURGERY Left    from fracture- rod  in palce  . IR PARACENTESIS  11/18/2017  . KNEE SURGERY Bilateral    open incision for ligaments  . ORIF ELBOW FRACTURE Left 04/30/2017   Procedure: REVISION OPEN REDUCTION INTERNAL FIXATION (ORIF) ELBOW/OLECRANON FRACTURE;  Surgeon: Altamese [place], MD;  Location: [facility];  Service: Orthopedics;  Laterality: Left;    Family History  Adopted: Yes    Social History:  reports that  has never smoked. she has never used smokeless tobacco. She reports that she does not drink alcohol or use drugs.  Allergies:  Allergies  Allergen Reactions  . Penicillins Swelling    FACIAL SWELLING  PATIENT HAS HAD A PCN REACTION WITH IMMEDIATE RASH, FACIAL/TONGUE/THROAT SWELLING, SOB, OR LIGHTHEADEDNESS WITH HYPOTENSION:  #  #  #  YES  #  #  #   Has patient had a PCN reaction causing severe rash involving mucus membranes or skin necrosis: No Has patient had a PCN reaction that required hospitalization: No Has patient had a PCN reaction occurring within the last 10 years: No If all of the above answers are "NO", then may proceed with Cephalosporin u  . Sulfa Antibiotics Itching and Rash    Medications:  Scheduled: . feeding supplement (ENSURE ENLIVE)  237 mL Oral BID BM  . gabapentin  200 mg Oral TID  . lactulose  10 g Oral TID  .  levothyroxine  50 mcg Oral QAC breakfast  . magnesium oxide  200 mg Oral Daily  . QUEtiapine  25 mg Oral QHS  . rifaximin  550 mg Oral BID  . senna-docusate  1 tablet Oral BID  . sertraline  100 mg Oral QHS  . traZODone  50 mg Oral QHS   Continuous: . vancomycin      Results for orders placed or performed during the hospital encounter of 11/25/17 (from the past 24 hour(s))  Basic metabolic panel     Status: Abnormal   Collection Time: 11/25/17  3:39 PM  Result Value Ref Range   Sodium 142 135 - 145 mmol/L   Potassium 3.6 3.5 - 5.1 mmol/L   Chloride 110 101 - 111 mmol/L   CO2 22 22 - 32 mmol/L   Glucose, Bld 88 65 - 99 mg/dL   BUN 12 6 - 20 mg/dL   Creatinine, Ser  0.63 0.44 - 1.00 mg/dL   Calcium 8.4 (L) 8.9 - 10.3 mg/dL   GFR calc non Af Amer >60 >60 mL/min   GFR calc Af Amer >60 >60 mL/min   Anion gap 10 5 - 15  CBC     Status: Abnormal   Collection Time: 11/25/17  3:39 PM  Result Value Ref Range   WBC 5.8 4.0 - 10.5 K/uL   RBC 4.17 3.87 - 5.11 MIL/uL   Hemoglobin 12.4 12.0 - 15.0 g/dL   HCT 40.0 36.0 - 46.0 %   MCV 95.9 78.0 - 100.0 fL   MCH 29.7 26.0 - 34.0 pg   MCHC 31.0 30.0 - 36.0 g/dL   RDW 17.6 (H) 11.5 - 15.5 %   Platelets 45 (L) 150 - 400 K/uL  Hepatic function panel     Status: Abnormal   Collection Time: 11/25/17  3:39 PM  Result Value Ref Range   Total Protein 5.8 (L) 6.5 - 8.1 g/dL   Albumin 2.5 (L) 3.5 - 5.0 g/dL   AST 48 (H) 15 - 41 U/L   ALT 15 14 - 54 U/L   Alkaline Phosphatase 180 (H) 38 - 126 U/L   Total Bilirubin 2.5 (H) 0.3 - 1.2 mg/dL   Bilirubin, Direct 1.0 (H) 0.1 - 0.5 mg/dL   Indirect Bilirubin 1.5 (H) 0.3 - 0.9 mg/dL  Sedimentation rate     Status: None   Collection Time: 11/25/17  3:39 PM  Result Value Ref Range   Sed Rate 5 0 - 22 mm/hr  I-stat troponin, ED     Status: None   Collection Time: 11/25/17  4:06 PM  Result Value Ref Range   Troponin i, poc 0.01 0.00 - 0.08 ng/mL   Comment 3          Differential     Status: None   Collection Time: 11/25/17  8:49 PM  Result Value Ref Range   Neutrophils Relative % 69 %   Neutro Abs 3.5 1.7 - 7.7 K/uL   Lymphocytes Relative 15 %   Lymphs Abs 0.7 0.7 - 4.0 K/uL   Monocytes Relative 12 %   Monocytes Absolute 0.6 0.1 - 1.0 K/uL   Eosinophils Relative 3 %   Eosinophils Absolute 0.2 0.0 - 0.7 K/uL   Basophils Relative 1 %   Basophils Absolute 0.0 0.0 - 0.1 K/uL  I-Stat CG4 Lactic Acid, ED     Status: None   Collection Time: 11/25/17  9:11 PM  Result Value Ref Range   Lactic Acid, Venous 1.08 0.5 -  1.9 mmol/L  C-reactive protein     Status: Abnormal   Collection Time: 11/25/17  9:41 PM  Result Value Ref Range   CRP 4.3 (H) <1.0 mg/dL  Ammonia      Status: Abnormal   Collection Time: 11/25/17 11:34 PM  Result Value Ref Range   Ammonia 54 (H) 9 - 35 umol/L  Protime-INR     Status: Abnormal   Collection Time: 11/25/17 11:34 PM  Result Value Ref Range   Prothrombin Time 17.9 (H) 11.4 - 15.2 seconds   INR 1.49   APTT     Status: Abnormal   Collection Time: 11/25/17 11:34 PM  Result Value Ref Range   aPTT 44 (H) 24 - 36 seconds  CBC     Status: Abnormal   Collection Time: 11/26/17  4:15 AM  Result Value Ref Range   WBC 4.7 4.0 - 10.5 K/uL   RBC 3.70 (L) 3.87 - 5.11 MIL/uL   Hemoglobin 11.2 (L) 12.0 - 15.0 g/dL   HCT 35.7 (L) 36.0 - 46.0 %   MCV 96.5 78.0 - 100.0 fL   MCH 30.3 26.0 - 34.0 pg   MCHC 31.4 30.0 - 36.0 g/dL   RDW 17.8 (H) 11.5 - 15.5 %   Platelets 44 (L) 150 - 400 K/uL  TSH     Status: Abnormal   Collection Time: 11/26/17  4:15 AM  Result Value Ref Range   TSH 18.008 (H) 0.350 - 4.500 uIU/mL     Dg Chest 2 View  Result Date: 11/25/2017 CLINICAL DATA:  Shortness of Breath EXAM: CHEST  2 VIEW COMPARISON:  11/16/2017 FINDINGS: Cardiac shadow is stable. The lungs are well aerated bilaterally. There is again identified soft tissue density over the right mid lung projecting posteriorly in the right lower lobe. Additionally a small right-sided pleural effusion is noted. The overall appearance is stable when compared with the prior examination. Improved aeration within the right lung is noted. The left lung is clear. No bony abnormality is noted. IMPRESSION: Persistent density overlying the right mid lung which has been stable over multiple previous exams and likely represents a loculated area of pleural fluid. Improved aeration in the right lung is noted when compare with the prior exam. Noncontrast CT may be helpful to establish a baseline for this rounded density in the right mid lung. This could be performed on a nonemergent basis as it is been stable over multiple previous exams. Electronically Signed   By: Inez Catalina  M.D.   On: 11/25/2017 17:58    ROS:  As stated above in the HPI otherwise negative.  Blood pressure 128/73, pulse 76, temperature 98.4 F (36.9 C), temperature source Oral, resp. rate 16, height 5' 7"  (1.702 m), weight 67.1 kg (147 lb 14.9 oz), SpO2 92 %.    PE: Gen: NAD, Alert and Oriented, weak and ille-appearing HEENT:  [place]/AT, EOMI Neck: Supple, no LAD Lungs: CTA Bilaterally CV: RRR without M/G/R ABM: Soft, distended with ascites, +BS Ext: No C/C/E  Assessment/Plan: 1) Cellulitis. 2) Ascites. 3) ETOH cirrhosis.   She has hepatic decompensation with her significant ascites.  She requires another paracentesis as her abdomen is moderately tense.  Also, she needs strict control of her sodium intake and to be restarted on her diuretics.  The patient reports that she was on the liver transplant list when she was living in [place], but no further follow up was made for her here in [place].  Currently [facility] is [facility] and she will need close follow up at [facility] upon stablization and control of her symptoms.   Plan: 1) Step I diuretics. 2) IR paracentesis. 3) Provide albumin 25 grams with every 4 liters of ascites removed. 4) Continue with antibiotics for cellulitis.  Alicyn Klann D 11/26/2017, 9:03 AM

## 2017-11-26 NOTE — Progress Notes (Signed)
PROGRESS NOTE  Kordelia Severin EAV:[id number] DOB: 1956/09/22 DOA: 11/25/2017 PCP: System, Pcp Not In  HPI/Recap of past 24 hours: Shandiin Eisenbeis is a 62 y.o. female with medical history significant of liver cirrhosis, thrombocytopenia, hypothyroidism, depression, anxiety, hepatic encephalopathy, Ogilvie syndrome, who presents with pain in left groin area and left lower abdominal wall. Suspected cellulitis and started on IV antibiotics. Last paracentesis 11/18/17 with 5L fluid removed. Admitted for left abdominal wall cellulitis.   11/26/17: patient seen and examined at her bedside. Reports pain is 8/10 left lower quadrant, dull, non radiating improved with her pain medication. Also reports achy feeling throughout her abdomen. No nausea or vomiting.    Assessment/Plan: Principal Problem:   Cellulitis, abdominal wall Active Problems:   Cirrhosis of liver (HCC)   Thrombocytopenia (HCC)   Ascites   SOB (shortness of breath)  Cellulitis, abdominal wall:  - Empiric antimicrobial treatment with vancomycin (pt received one dose of clindamycin in ED)  - PRN Zofran for nausea, morphine and oxcodon for pain - Blood cultures x 2 no growth less than 24 hr - ESR and CRP 4.3 - check lactic acid level normal  Cirrhosis of liver and ascites: -did not take her lasix  lactulose for more than a week.  -Mental status normal. 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 -CBC am  Hyperammonemia -ammonia 54 -continue lactulose -monitor stool frequency  Hypokalemia -K+ 3.2 -repleted -BMP am  Moderate protein calorie malnutrition 2/2 to cirrhosis -albumin 2.4 -closely monitor  Normocytic anemia -baseline 12 -hg 11.2 -no sign of overt bleeding   Code Status: full   Family Communication:  none at bedside  Disposition Plan: will stay another midnight to continue IV antibiotics.  SEVERITY: Moderate to severe due to multiple commorbidities in the setting of advanced liver failure.   Consultants:  GI  Procedures:  none  Antimicrobials:  IV vanc   DVT prophylaxis:  SCds   Objective: Vitals:   11/25/17 2130 11/25/17 2200 11/25/17 2232 11/26/17 0446  BP: 134/78 111/61 (!) 144/84 128/73  Pulse: 73 70 72 76  Resp: 17 13 16 16   Temp:    98.4 F (36.9 C)  TempSrc:    Oral  SpO2: 92% (!) 87% 96% 92%  Weight:    67.1 kg (147 lb 14.9 oz)  Height:        Intake/Output Summary (Last 24 hours) at 11/26/2017 0834 Last data filed at 11/25/2017 2254 Gross per 24 hour  Intake 290 ml  Output -  Net 290 ml   Filed Weights   11/25/17 1537 11/26/17 0446  Weight: 70.8 kg (156 lb) 67.1 kg (147 lb 14.9 oz)    Exam:   General:  62 yo CF thin A&O x 3 appears uncomfortable due to abdominal pain   Cardiovascular: RRR no rubs or gallops   Respiratory: CTA no wheezes or rales  Abdomen: distended NBS x4 left lower abdominal quadrant with erythema and warmth to touch   Musculoskeletal: non focal moves all 4   Skin: as stated above  Psychiatry: mood is anxious    Data Reviewed: CBC: Recent Labs  Lab 11/25/17 1539 11/25/17 2049 11/26/17 0415  WBC 5.8  --  4.7  NEUTROABS  --  3.5  --   HGB 12.4  --  11.2*  HCT 40.0  --  35.7*  MCV 95.9  --  96.5  PLT 45*  --  44*   Basic Metabolic Panel: Recent Labs  Lab 11/25/17 1539  NA 142  K 3.6  CL 110  CO2 22  GLUCOSE 88  BUN 12  CREATININE 0.63  CALCIUM 8.4*   GFR: Estimated Creatinine Clearance: 73.6 mL/min (by C-G formula based on SCr of 0.63 mg/dL). Liver Function Tests: Recent Labs  Lab 11/25/17 1539  AST 48*  ALT 15  ALKPHOS 180*  BILITOT 2.5*  PROT 5.8*  ALBUMIN 2.5*   No results for input(s): LIPASE, AMYLASE in the last 168 hours. Recent Labs  Lab 11/25/17 2334  AMMONIA 54*    Coagulation Profile: Recent Labs  Lab 11/25/17 2334  INR 1.49   Cardiac Enzymes: No results for input(s): CKTOTAL, CKMB, CKMBINDEX, TROPONINI in the last 168 hours. BNP (last 3 results) No results for input(s): PROBNP in the last 8760 hours. HbA1C: No results for input(s): HGBA1C in the last 72 hours. CBG: No results for input(s): GLUCAP in the last 168 hours. Lipid Profile: No results for input(s): CHOL, HDL, LDLCALC, TRIG, CHOLHDL, LDLDIRECT in the last 72 hours. Thyroid Function Tests: Recent Labs    11/26/17 0415  TSH 18.008*   Anemia Panel: No results for input(s): VITAMINB12, FOLATE, FERRITIN, TIBC, IRON, RETICCTPCT in the last 72 hours. Urine analysis:    Component Value Date/Time   COLORURINE AMBER (A) 11/16/2017 1740   APPEARANCEUR HAZY (A) 11/16/2017 1740   LABSPEC 1.033 (H) 11/16/2017 1740   PHURINE 6.0 11/16/2017 1740   GLUCOSEU NEGATIVE 11/16/2017 1740   HGBUR LARGE (A) 11/16/2017 1740   BILIRUBINUR SMALL (A) 11/16/2017 1740   KETONESUR 5 (A) 11/16/2017 1740   PROTEINUR 100 (A) 11/16/2017 1740   NITRITE NEGATIVE 11/16/2017 1740   LEUKOCYTESUR NEGATIVE 11/16/2017 1740   Sepsis Labs: @LABRCNTIP (procalcitonin:4,lacticidven:4)  )No results found for this or any previous visit (from the past 240 hour(s)).    Studies: Dg Chest 2 View  Result Date: 11/25/2017 CLINICAL DATA:  Shortness of Breath EXAM: CHEST  2 VIEW COMPARISON:  11/16/2017 FINDINGS: Cardiac shadow is stable. The lungs are well aerated bilaterally. There is again identified soft tissue density over the right mid lung projecting posteriorly in the right lower lobe. Additionally a small right-sided pleural effusion is noted. The overall appearance is stable when compared with the prior examination. Improved aeration within the right lung is noted. The left lung is clear. No bony abnormality is noted. IMPRESSION: Persistent density overlying the right mid lung which has been stable over multiple  previous exams and likely represents a loculated area of pleural fluid. Improved aeration in the right lung is noted when compare with the prior exam. Noncontrast CT may be helpful to establish a baseline for this rounded density in the right mid lung. This could be performed on a nonemergent basis as it is been stable over multiple previous exams. Electronically Signed   By: Inez Catalina M.D.   On: 11/25/2017 17:58    Scheduled Meds: . feeding supplement (ENSURE ENLIVE)  237 mL Oral BID BM  . gabapentin  200 mg Oral TID  . lactulose  10 g Oral TID  . levothyroxine  50 mcg Oral QAC breakfast  . magnesium oxide  200 mg Oral Daily  . QUEtiapine  25 mg Oral QHS  . rifaximin  550 mg Oral BID  . senna-docusate  1 tablet Oral BID  . sertraline  100 mg Oral QHS  . traZODone  50 mg Oral QHS    Continuous Infusions: . vancomycin       LOS: 1 day     Kayleen Memos, MD [facility] Pager [phone]  If 7PM-7AM, please contact night-coverage [email or web address] Password [facility] 11/26/2017, 8:34 AM

## 2017-11-26 NOTE — Evaluation (Signed)
Physical Therapy Evaluation Patient Details Name: Megan Roberson MRN: [id number] DOB: 1956/08/22 Today's Date: 11/26/2017   History of Present Illness  Pt is a 62 y.o. female with medical history significant of liver cirrhosis, thrombocytopenia, hypothyroidism, depression, anxiety, hepatic encephalopathy, Ogilvie syndrome, and recent fall with lumbar transverse process fractures and a S2 fracture.  She presented with pain in left groin area and left lower abdominal wall and SOB. She had a paracentesis done on 1/17.     Clinical Impression  Pt admitted with above diagnosis. Pt currently with functional limitations due to the deficits listed below (see PT Problem List). PTA, pt using wheelchair as primary mobility. Her husband assists with all ADLs. Pt with recent admission early Jan. 2019 following a fall with resulting spinal fractures. Prior to that admission, pt ambulated in the house modified independent with hemiwalker. On eval, pt required mod assist bed mobility and transfers. Unable to progress with ambulation due to pain.  Pt will benefit from skilled PT to increase their independence and safety with mobility to allow discharge to the venue listed below.  Pt reports her husband can provide needed level of assist at home.     Follow Up Recommendations Home health PT;Supervision/Assistance - 24 hour    Equipment Recommendations  None recommended by PT    Recommendations for Other Services       Precautions / Restrictions Precautions Precautions: Fall Restrictions Other Position/Activity Restrictions: On last admission, pt issued TLSO due to spinal fractures. Pt reports she has not worn brace since discharge. She states now it will not fit over her ascites.       Mobility  Bed Mobility Overal bed mobility: Needs Assistance Bed Mobility: Rolling;Sidelying to Sit Rolling: Mod assist Sidelying to sit: HOB elevated;Mod assist       General bed mobility comments: + rail,  increased time and effort, assist to bring BLE off bed and to elevate trunk  Transfers Overall transfer level: Needs assistance   Transfers: Sit to/from Stand;Stand Pivot Transfers Sit to Stand: Mod assist Stand pivot transfers: Mod assist       General transfer comment: verbal cues for hand placement and sequencing  Ambulation/Gait             General Gait Details: unable   Stairs            Wheelchair Mobility    Modified Rankin (Stroke Patients Only)       Balance Overall balance assessment: Needs assistance;History of Falls Sitting-balance support: Feet supported;Bilateral upper extremity supported Sitting balance-Leahy Scale: Fair   Postural control: Posterior lean Standing balance support: Bilateral upper extremity supported;During functional activity Standing balance-Leahy Scale: Zero                               Pertinent Vitals/Pain Pain Assessment: Faces Faces Pain Scale: Hurts whole lot Pain Location: abdomen Pain Descriptors / Indicators: Sore;Grimacing;Guarding;Moaning Pain Intervention(s): Monitored during session;Limited activity within patient's tolerance;Repositioned    Home Living Family/patient expects to be discharged to:: Private residence Living Arrangements: Spouse/significant other Available Help at Discharge: Family;Available 24 hours/day Type of Home: House Home Access: Stairs to enter Entrance Stairs-Rails: None Entrance Stairs-Number of Steps: 1 Home Layout: One level Home Equipment: Cane - single point;Shower seat;Bedside commode;Wheelchair - manual      Prior Function Level of Independence: Needs assistance   Gait / Transfers Assistance Needed: mod I transfers. Pt using wheelchair. Nonambulatory  ADL's / Homemaking  Assistance Needed: husband assists with bathing, dressing, and toileting. BSC used for toileting.        Hand Dominance   Dominant Hand: Right    Extremity/Trunk Assessment   Upper  Extremity Assessment Upper Extremity Assessment: Defer to OT evaluation    Lower Extremity Assessment Lower Extremity Assessment: Generalized weakness;RLE deficits/detail;LLE deficits/detail RLE: Unable to fully assess due to pain LLE Deficits / Details: foot drop LLE: Unable to fully assess due to pain       Communication   Communication: No difficulties  Cognition Arousal/Alertness: Awake/alert Behavior During Therapy: WFL for tasks assessed/performed Overall Cognitive Status: Within Functional Limits for tasks assessed                                        General Comments      Exercises     Assessment/Plan    PT Assessment Patient needs continued PT services  PT Problem List Decreased strength;Decreased activity tolerance;Decreased balance;Decreased mobility;Decreased coordination;Decreased safety awareness;Decreased knowledge of use of DME;Decreased knowledge of precautions;Pain       PT Treatment Interventions DME instruction;Gait training;Stair training;Functional mobility training;Therapeutic activities;Therapeutic exercise;Balance training;Neuromuscular re-education;Patient/family education    PT Goals (Current goals can be found in the Care Plan section)  Acute Rehab PT Goals Patient Stated Goal: decrease pain PT Goal Formulation: With patient Time For Goal Achievement: 12/10/17 Potential to Achieve Goals: Good    Frequency Min 3X/week   Barriers to discharge        Co-evaluation               AM-PAC PT "6 Clicks" Daily Activity  Outcome Measure Difficulty turning over in bed (including adjusting bedclothes, sheets and blankets)?: Unable Difficulty moving from lying on back to sitting on the side of the bed? : Unable Difficulty sitting down on and standing up from a chair with arms (e.g., wheelchair, bedside commode, etc,.)?: Unable Help needed moving to and from a bed to chair (including a wheelchair)?: A Lot Help needed  walking in hospital room?: Total Help needed climbing 3-5 steps with a railing? : Total 6 Click Score: 7    End of Session Equipment Utilized During Treatment: Gait belt Activity Tolerance: Patient limited by pain Patient left: in chair;with call bell/phone within reach Nurse Communication: Mobility status PT Visit Diagnosis: Other abnormalities of gait and mobility (R26.89)    Time: 5872-7618 PT Time Calculation (min) (ACUTE ONLY): 24 min   Charges:   PT Evaluation $PT Eval Moderate Complexity: 1 Mod PT Treatments $Therapeutic Activity: 8-22 mins   PT G Codes:        Lorrin Goodell, PT  Office # [phone] Pager [phone]   Lorriane Shire 11/26/2017, 12:44 PM

## 2017-11-26 NOTE — Progress Notes (Signed)
Initial Nutrition Assessment  DOCUMENTATION CODES:   Severe malnutrition in context of chronic illness  INTERVENTION:   -Increase Ensure Enlive po to TID, each supplement provides 350 kcal and 20 grams of protein -MVI daily  NUTRITION DIAGNOSIS:   Severe Malnutrition related to chronic illness(cirrhosis) as evidenced by severe fat depletion, severe muscle depletion, energy intake < 75% for > or equal to 1 month.  GOAL:   Patient will meet greater than or equal to 90% of their needs  MONITOR:   PO intake, Supplement acceptance, Labs, Weight trends, Skin, I & O's  REASON FOR ASSESSMENT:   Malnutrition Screening Tool    ASSESSMENT:   Megan Roberson is a 62 y.o. female with medical history significant of liver cirrhosis, thrombocytopenia, hypothyroidism, depression, anxiety, hepatic encephalopathy, Ogilvie syndrome, who presents with pain in left groin area and left lower abdominal wall and SOB  1/17- s/p paracentesis  Pt admitted with abdominal wall cellulitis.   Spoke with pt at bedside. She reports chronically poor appetite related to early satiety and ascites. Pt has been consuming several small meals per day, but intake is limited to liquids such as soups, ice cream, Ensure, and Carnation Instant Breakfast. Meal completion 25-45%. Noted pt consumed Ensure and multiple juices off breakfast tray.   Pt endorses weight loss, but unsure how much. Reviewed wt hx, which reveals wt stability over the past 6 months. Suspect ascites is masking true wt loss.   Pt very inquisitive about diet. Discussed importance of sodium restriction, as well as small, frequent meals to assist with optimizing intake. Also discussed high caloric density nutritional supplements for home use; pt often adds ice cream to supplements to add calories and protein.   Labs reviewed: K: 3.2.   NUTRITION - FOCUSED PHYSICAL EXAM:    Most Recent Value  Orbital Region  Mild depletion  Upper Arm Region  Severe  depletion  Thoracic and Lumbar Region  Mild depletion  Buccal Region  Mild depletion  Temple Region  Mild depletion  Clavicle Bone Region  Severe depletion  Clavicle and Acromion Bone Region  Severe depletion  Scapular Bone Region  Severe depletion  Dorsal Hand  Severe depletion  Patellar Region  Severe depletion  Anterior Thigh Region  Severe depletion  Posterior Calf Region  Severe depletion  Edema (RD Assessment)  None  Hair  Reviewed  Eyes  Reviewed  Mouth  Reviewed  Skin  Reviewed  Nails  Reviewed       Diet Order:  Diet 2 gram sodium Room service appropriate? Yes; Fluid consistency: Thin  EDUCATION NEEDS:   Education needs have been addressed  Skin:  Skin Assessment: Reviewed RN Assessment  Last BM:  11/25/17  Height:   Ht Readings from Last 1 Encounters:  11/25/17 5' 7"  (1.702 m)    Weight:   Wt Readings from Last 1 Encounters:  11/26/17 147 lb 14.9 oz (67.1 kg)    Ideal Body Weight:  61.4 kg  BMI:  Body mass index is 23.17 kg/m.  Estimated Nutritional Needs:   Kcal:  2000-2200  Protein:  100-115 grams  Fluid:  2.0-2.2 L    Lindel Marcell A. Jimmye Norman, RD, LDN, CDE Pager: [phone] After hours Pager: [phone]

## 2017-11-27 DIAGNOSIS — R188 Other ascites: Secondary | ICD-10-CM

## 2017-11-27 DIAGNOSIS — K7460 Unspecified cirrhosis of liver: Secondary | ICD-10-CM

## 2017-11-27 DIAGNOSIS — M899 Disorder of bone, unspecified: Secondary | ICD-10-CM

## 2017-11-27 DIAGNOSIS — L03311 Cellulitis of abdominal wall: Principal | ICD-10-CM

## 2017-11-27 LAB — BASIC METABOLIC PANEL
Anion gap: 10 (ref 5–15)
BUN: 8 mg/dL (ref 6–20)
CO2: 24 mmol/L (ref 22–32)
Calcium: 8.1 mg/dL — ABNORMAL LOW (ref 8.9–10.3)
Chloride: 104 mmol/L (ref 101–111)
Creatinine, Ser: 0.63 mg/dL (ref 0.44–1.00)
GFR calc Af Amer: >60 mL/min (ref >60)
GFR calc non Af Amer: >60 mL/min (ref >60)
Glucose, Bld: 85 mg/dL (ref 65–99)
Potassium: 3.2 mmol/L — ABNORMAL LOW (ref 3.5–5.1)
Sodium: 138 mmol/L (ref 135–145)

## 2017-11-27 LAB — AMMONIA: Ammonia: 52 umol/L — ABNORMAL HIGH (ref 9–35)

## 2017-11-27 MED ORDER — IOPAMIDOL (ISOVUE-300) INJECTION 61%
INTRAVENOUS | Status: AC
Start: 2017-11-27 — End: 2017-11-28
  Administered 2017-11-28: 100 mL
  Filled 2017-11-27: qty 30

## 2017-11-27 MED ORDER — POTASSIUM CHLORIDE CRYS ER 20 MEQ PO TBCR
40.0000 meq | EXTENDED_RELEASE_TABLET | Freq: Two times a day (BID) | ORAL | Status: AC
  Administered 2017-11-27 (×2): 40 meq via ORAL
  Filled 2017-11-27 (×2): qty 2

## 2017-11-27 MED ORDER — POLYETHYLENE GLYCOL 3350 17 G PO PACK
17.0000 g | PACK | Freq: Every day | ORAL | Status: DC
  Administered 2017-11-27 – 2017-12-01 (×5): 17 g via ORAL
  Filled 2017-11-27 (×5): qty 1

## 2017-11-27 NOTE — Progress Notes (Signed)
PROGRESS NOTE  Megan Roberson DSK:[id number] DOB: Mar 22, 1956 DOA: 11/25/2017 PCP: System, Pcp Not In  HPI/Recap of past 24 hours: Megan Roberson is a 62 y.o. female with medical history significant of liver cirrhosis, thrombocytopenia, hypothyroidism, depression, anxiety, hepatic encephalopathy, Ogilvie syndrome, who presents with pain in left groin area and left lower abdominal wall. Suspected cellulitis and started on IV antibiotics. Last paracentesis 11/18/17 with 5L fluid removed. Admitted for left abdominal wall cellulitis.   11/27/17: Patient seen and examined at his bedside. She reports persistent left pubic tenderness at 8/10 even after she takes pain medication. Had CT abd/ pelvis done in December but at that time did not have the present symptoms. Will repeat CT abd pelvis today.  Assessment/Plan: Principal Problem:   Cellulitis, abdominal wall Active Problems:   Cirrhosis of liver (HCC)   Thrombocytopenia (HCC)   Ascites   SOB (shortness of breath)  Cellulitis, abdominal wall:  - Empiric antimicrobial treatment with vancomycin (pt received one dose of clindamycin in ED)  - PRN Zofran for nausea, morphine and oxcodon for pain - Blood cultures x 2 no growth  - ESR and CRP 4.3 - check lactic acid level normal - continue IV vancomycin  Cirrhosis of liver and ascites: -did not take her lasix  lactulose for more than a week.  -Mental status normal. -continue lactulose -added miralax due to constipation; self reported taking it at home daily -ammonia level 54 -INR 1.49 -resume diuretics  Intractable pubic pain out of proportion to exam -Had CT abd pelvis w contrast in 10/29/17. 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 -CBC am  Hyperammonemia -ammonia 54 -continue lactulose -monitor  stool frequency -has not had BM in 2 days -start miralax daily in addition to scheduled lactulose  Hypokalemia -K+ 3.2 -repleted -BMP am  Moderate protein calorie malnutrition 2/2 to cirrhosis -albumin 2.4 -closely monitor  Normocytic anemia -baseline 12 -hg 11.2 -no sign of overt bleeding   Code Status: full   Family Communication: none at bedside  Disposition Plan: will stay another midnight to continue IV antibiotics.  SEVERITY: Moderate to severe due to multiple commorbidities in the setting of advanced liver failure.   Consultants:  GI  Procedures:  none  Antimicrobials:  IV vanc   DVT prophylaxis:  SCds   Objective: Vitals:   11/26/17 1643 11/26/17 1723 11/26/17 2127 11/27/17 0448  BP: 106/60 126/67 133/73 104/61  Pulse:  82 84 89  Resp:  18 18 18   Temp:  99.6 F (37.6 C) 98.6 F (37 C) 98.5 F (36.9 C)  TempSrc:  Oral Oral Oral  SpO2:  90% 95% 90%  Weight:    66.4 kg (146 lb 6.2 oz)  Height:        Intake/Output Summary (Last 24 hours) at 11/27/2017 0858 Last data filed at 11/27/2017 0451 Gross per 24 hour  Intake 970 ml  Output 1825 ml  Net -855 ml   Filed Weights   11/25/17 1537 11/26/17 0446 11/27/17 0448  Weight: 70.8 kg (156 lb) 67.1 kg (147 lb 14.9 oz) 66.4 kg (146 lb 6.2 oz)    Exam:11/27/17: seen and examined at her bedside.   General:  62 yo CF thin A&O x 3 appears uncomfortable due to abdominal pain   Cardiovascular: RRR no rubs or gallops   Respiratory: CTA no  wheezes or rales  Abdomen: distended NBS x4 left lower abdominal quadrant with erythema and warmth to touch   GU: swelling in pubic area very tender on palpation to the left. No erythema.  Musculoskeletal: non focal moves all 4   Skin: as stated above  Psychiatry: mood is anxious    Data Reviewed: CBC: Recent Labs  Lab 11/25/17 1539 11/25/17 2049 11/26/17 0415  WBC 5.8  --  4.7  NEUTROABS  --  3.5  --   HGB 12.4  --  11.2*  HCT 40.0  --  35.7*    MCV 95.9  --  96.5  PLT 45*  --  44*   Basic Metabolic Panel: Recent Labs  Lab 11/25/17 1539 11/26/17 0717  NA 142 140  K 3.6 3.2*  CL 110 111  CO2 22 19*  GLUCOSE 88 121*  BUN 12 15  CREATININE 0.63 0.65  CALCIUM 8.4* 7.9*   GFR: Estimated Creatinine Clearance: 73.6 mL/min (by C-G formula based on SCr of 0.65 mg/dL). Liver Function Tests: Recent Labs  Lab 11/25/17 1539 11/26/17 0717  AST 48* 40  ALT 15 14  ALKPHOS 180* 155*  BILITOT 2.5* 2.5*  PROT 5.8* 4.9*  ALBUMIN 2.5* 2.4*   No results for input(s): LIPASE, AMYLASE in the last 168 hours. Recent Labs  Lab 11/25/17 2334  AMMONIA 54*   Coagulation Profile: Recent Labs  Lab 11/25/17 2334  INR 1.49   Cardiac Enzymes: No results for input(s): CKTOTAL, CKMB, CKMBINDEX, TROPONINI in the last 168 hours. BNP (last 3 results) No results for input(s): PROBNP in the last 8760 hours. HbA1C: No results for input(s): HGBA1C in the last 72 hours. CBG: No results for input(s): GLUCAP in the last 168 hours. Lipid Profile: No results for input(s): CHOL, HDL, LDLCALC, TRIG, CHOLHDL, LDLDIRECT in the last 72 hours. Thyroid Function Tests: Recent Labs    11/26/17 0415  TSH 18.008*   Anemia Panel: No results for input(s): VITAMINB12, FOLATE, FERRITIN, TIBC, IRON, RETICCTPCT in the last 72 hours. Urine analysis:    Component Value Date/Time   COLORURINE AMBER (A) 11/16/2017 1740   APPEARANCEUR HAZY (A) 11/16/2017 1740   LABSPEC 1.033 (H) 11/16/2017 1740   PHURINE 6.0 11/16/2017 1740   GLUCOSEU NEGATIVE 11/16/2017 1740   HGBUR LARGE (A) 11/16/2017 1740   BILIRUBINUR SMALL (A) 11/16/2017 1740   KETONESUR 5 (A) 11/16/2017 1740   PROTEINUR 100 (A) 11/16/2017 1740   NITRITE NEGATIVE 11/16/2017 1740   LEUKOCYTESUR NEGATIVE 11/16/2017 1740   Sepsis Labs: @LABRCNTIP (procalcitonin:4,lacticidven:4)  ) Recent Results (from the past 240 hour(s))  Culture, blood (routine x 2)     Status: None (Preliminary result)    Collection Time: 11/25/17  9:04 PM  Result Value Ref Range Status   Specimen Description BLOOD RIGHT ANTECUBITAL  Final   Special Requests   Final    BOTTLES DRAWN AEROBIC AND ANAEROBIC Blood Culture adequate volume   Culture NO GROWTH < 24 HOURS  Final   Report Status PENDING  Incomplete  Culture, blood (routine x 2)     Status: None (Preliminary result)   Collection Time: 11/25/17  9:05 PM  Result Value Ref Range Status   Specimen Description BLOOD RIGHT ARM  Final   Special Requests   Final    BOTTLES DRAWN AEROBIC AND ANAEROBIC Blood Culture adequate volume   Culture NO GROWTH < 24 HOURS  Final   Report Status PENDING  Incomplete      Studies: Ir Paracentesis  Result  Date: 11/26/2017 INDICATION: Patient with recurrent ascites. Request is made for therapeutic paracentesis. EXAM: ULTRASOUND GUIDED THERAPEUTIC PARACENTESIS MEDICATIONS: 5 mL 2% lidocaine COMPLICATIONS: None immediate. PROCEDURE: Informed written consent was obtained from the patient after a discussion of the risks, benefits and alternatives to treatment. A timeout was performed prior to the initiation of the procedure. Initial ultrasound scanning demonstrates a small amount of ascites within the right lower abdominal quadrant. The right lower abdomen was prepped and draped in the usual sterile fashion. 2% lidocaine with epinephrine was used for local anesthesia. Following this, a 19 gauge, 7-cm, Yueh catheter was introduced. An ultrasound image was saved for documentation purposes. The paracentesis was performed. The catheter was removed and a dressing was applied. The patient tolerated the procedure well without immediate post procedural complication. FINDINGS: A total of approximately 2.2 L of clear, yellow fluid was removed. IMPRESSION: Successful ultrasound-guided therapeutic paracentesis yielding 2.2 liters of peritoneal fluid. Read by: Brynda Greathouse PA-C Electronically Signed   By: Lucrezia Europe M.D.   On: 11/26/2017 16:52      Scheduled Meds: . feeding supplement (ENSURE ENLIVE)  237 mL Oral TID BM  . furosemide  40 mg Oral Daily  . gabapentin  200 mg Oral TID  . lactulose  10 g Oral TID  . levothyroxine  50 mcg Oral QAC breakfast  . magnesium oxide  200 mg Oral Daily  . multivitamin with minerals  1 tablet Oral Daily  . QUEtiapine  25 mg Oral QHS  . rifaximin  550 mg Oral BID  . senna-docusate  1 tablet Oral BID  . sertraline  100 mg Oral QHS  . spironolactone  100 mg Oral Daily  . traZODone  50 mg Oral QHS    Continuous Infusions: . vancomycin Stopped (11/26/17 2320)     LOS: 2 days     Kayleen Memos, MD [facility] Pager [phone]  If 7PM-7AM, please contact night-coverage [email or web address] Password [facility] 11/27/2017, 8:58 AM

## 2017-11-27 NOTE — Progress Notes (Addendum)
GI Progress Note covering for Dr. Collene Mares & Benson [place]   Subjective  Complains of abdominal pain   Objective  Vital signs in last 24 hours: Temp:  [98.5 F (36.9 C)-99.6 F (37.6 C)] 98.5 F (36.9 C) (01/26 0448) Pulse Rate:  [82-89] 89 (01/26 0448) Resp:  [18] 18 (01/26 0448) BP: (104-133)/(60-73) 104/61 (01/26 0448) SpO2:  [90 %-95 %] 90 % (01/26 0448) Weight:  [146 lb 6.2 oz (66.4 kg)] 146 lb 6.2 oz (66.4 kg) (01/26 0448) Last BM Date: 11/25/17  General: Alert, well-developed, in NAD Heart:  Regular rate and rhythm; no murmurs Chest: Clear to ascultation bilaterally Abdomen:  Soft, moderate tenderness over cellulitis and mild tenderness elsewhere.  Moderately distended. Normal bowel sounds, without guarding, and without rebound.   Extremities:  Without edema. Neurologic:  Alert and  oriented x4; grossly normal neurologically. Psych:  Alert and cooperative. Normal mood and affect.  Intake/Output from previous day: 01/25 0701 - 01/26 0700 In: 970 [P.O.:820; IV Piggyback:150] Out: 8413 [Urine:1825] Intake/Output this shift: Total I/O In: -  Out: 1100 [Urine:1100]  Lab Results: Recent Labs    11/25/17 1539 11/26/17 0415  WBC 5.8 4.7  HGB 12.4 11.2*  HCT 40.0 35.7*  PLT 45* 44*   BMET Recent Labs    11/25/17 1539 11/26/17 0717 11/27/17 0903  NA 142 140 138  K 3.6 3.2* 3.2*  CL 110 111 104  CO2 22 19* 24  GLUCOSE 88 121* 85  BUN 12 15 8   CREATININE 0.63 0.65 0.63  CALCIUM 8.4* 7.9* 8.1*   LFT Recent Labs    11/25/17 1539 11/26/17 0717  PROT 5.8* 4.9*  ALBUMIN 2.5* 2.4*  AST 48* 40  ALT 15 14  ALKPHOS 180* 155*  BILITOT 2.5* 2.5*  BILIDIR 1.0*  --   IBILI 1.5*  --    PT/INR Recent Labs    11/25/17 2334  LABPROT 17.9*  INR 1.49   Hepatitis Panel No results for input(s): HEPBSAG, HCVAB, HEPAIGM, HEPBIGM in the last 72 hours.  Studies/Results: Dg Chest 2 View  Result Date: 11/25/2017 CLINICAL DATA:  Shortness of Breath EXAM: CHEST  2 VIEW  COMPARISON:  11/16/2017 FINDINGS: Cardiac shadow is stable. The lungs are well aerated bilaterally. There is again identified soft tissue density over the right mid lung projecting posteriorly in the right lower lobe. Additionally a small right-sided pleural effusion is noted. The overall appearance is stable when compared with the prior examination. Improved aeration within the right lung is noted. The left lung is clear. No bony abnormality is noted. IMPRESSION: Persistent density overlying the right mid lung which has been stable over multiple previous exams and likely represents a loculated area of pleural fluid. Improved aeration in the right lung is noted when compare with the prior exam. Noncontrast CT may be helpful to establish a baseline for this rounded density in the right mid lung. This could be performed on a nonemergent basis as it is been stable over multiple previous exams. Electronically Signed   By: Inez Catalina M.D.   On: 11/25/2017 17:58   Ir Paracentesis  Result Date: 11/26/2017 INDICATION: Patient with recurrent ascites. Request is made for therapeutic paracentesis. EXAM: ULTRASOUND GUIDED THERAPEUTIC PARACENTESIS MEDICATIONS: 5 mL 2% lidocaine COMPLICATIONS: None immediate. PROCEDURE: Informed written consent was obtained from the patient after a discussion of the risks, benefits and alternatives to treatment. A timeout was performed prior to the initiation of the procedure. Initial ultrasound scanning demonstrates a small amount of ascites within  the right lower abdominal quadrant. The right lower abdomen was prepped and draped in the usual sterile fashion. 2% lidocaine with epinephrine was used for local anesthesia. Following this, a 19 gauge, 7-cm, Yueh catheter was introduced. An ultrasound image was saved for documentation purposes. The paracentesis was performed. The catheter was removed and a dressing was applied. The patient tolerated the procedure well without immediate post  procedural complication. FINDINGS: A total of approximately 2.2 L of clear, yellow fluid was removed. IMPRESSION: Successful ultrasound-guided therapeutic paracentesis yielding 2.2 liters of peritoneal fluid. Read by: Brynda Greathouse PA-C Electronically Signed   By: Lucrezia Europe M.D.   On: 11/26/2017 16:52      Assessment & Plan   1.  Cirrhosis with ascites. 2.2L LVP yesterday. No ascites cell counts reported. Lasix and Aldactone started yesterday. Follow Is/Os, daily weights, BMET. Pt is followed by Dr. Delight Stare at [facility].    2.  Left abdominal wall cellulitis on IV vanco.     LOS: 2 days   Norberto Sorenson T. Fuller Plan MD  11/27/2017, 2:56 PM

## 2017-11-28 ENCOUNTER — Inpatient Hospital Stay (HOSPITAL_COMMUNITY): Payer: Medicare HMO

## 2017-11-28 DIAGNOSIS — S32592A Other specified fracture of left pubis, initial encounter for closed fracture: Secondary | ICD-10-CM

## 2017-11-28 DIAGNOSIS — R1084 Generalized abdominal pain: Secondary | ICD-10-CM

## 2017-11-28 LAB — COMPREHENSIVE METABOLIC PANEL
ALT: 14 U/L (ref 14–54)
AST: 41 U/L (ref 15–41)
Albumin: 2.6 g/dL — ABNORMAL LOW (ref 3.5–5.0)
Alkaline Phosphatase: 158 U/L — ABNORMAL HIGH (ref 38–126)
Anion gap: 9 (ref 5–15)
BUN: 6 mg/dL (ref 6–20)
CO2: 27 mmol/L (ref 22–32)
Calcium: 8.1 mg/dL — ABNORMAL LOW (ref 8.9–10.3)
Chloride: 101 mmol/L (ref 101–111)
Creatinine, Ser: 0.62 mg/dL (ref 0.44–1.00)
GFR calc Af Amer: >60 mL/min (ref >60)
GFR calc non Af Amer: >60 mL/min (ref >60)
Glucose, Bld: 90 mg/dL (ref 65–99)
Potassium: 3.2 mmol/L — ABNORMAL LOW (ref 3.5–5.1)
Sodium: 137 mmol/L (ref 135–145)
Total Bilirubin: 2.1 mg/dL — ABNORMAL HIGH (ref 0.3–1.2)
Total Protein: 5.3 g/dL — ABNORMAL LOW (ref 6.5–8.1)

## 2017-11-28 LAB — CBC
HCT: 34 % — ABNORMAL LOW (ref 36.0–46.0)
Hemoglobin: 10.9 g/dL — ABNORMAL LOW (ref 12.0–15.0)
MCH: 29.9 pg (ref 26.0–34.0)
MCHC: 32.1 g/dL (ref 30.0–36.0)
MCV: 93.2 fL (ref 78.0–100.0)
Platelets: 42 10*3/uL — ABNORMAL LOW (ref 150–400)
RBC: 3.65 MIL/uL — ABNORMAL LOW (ref 3.87–5.11)
RDW: 17 % — ABNORMAL HIGH (ref 11.5–15.5)
WBC: 4.3 10*3/uL (ref 4.0–10.5)

## 2017-11-28 MED ORDER — IOPAMIDOL (ISOVUE-300) INJECTION 61%
INTRAVENOUS | Status: AC
  Administered 2017-11-28: 01:00:00
  Filled 2017-11-28: qty 100

## 2017-11-28 MED ORDER — LEVOTHYROXINE SODIUM 75 MCG PO TABS
75.0000 ug | ORAL_TABLET | Freq: Every day | ORAL | Status: DC
  Administered 2017-11-29 – 2017-12-01 (×3): 75 ug via ORAL
  Filled 2017-11-28 (×3): qty 1

## 2017-11-28 MED ORDER — OXYCODONE HCL 5 MG PO TABS
10.0000 mg | ORAL_TABLET | Freq: Four times a day (QID) | ORAL | Status: DC | PRN
  Administered 2017-11-28 – 2017-12-01 (×8): 10 mg via ORAL
  Filled 2017-11-28 (×10): qty 2

## 2017-11-28 MED ORDER — POTASSIUM CHLORIDE CRYS ER 20 MEQ PO TBCR
40.0000 meq | EXTENDED_RELEASE_TABLET | Freq: Three times a day (TID) | ORAL | Status: AC
  Administered 2017-11-28 (×3): 40 meq via ORAL
  Filled 2017-11-28 (×3): qty 2

## 2017-11-28 MED ORDER — GABAPENTIN 300 MG PO CAPS
300.0000 mg | ORAL_CAPSULE | Freq: Three times a day (TID) | ORAL | Status: DC
  Administered 2017-11-28 – 2017-12-01 (×10): 300 mg via ORAL
  Filled 2017-11-28 (×10): qty 1

## 2017-11-28 MED ORDER — POTASSIUM CHLORIDE CRYS ER 20 MEQ PO TBCR
20.0000 meq | EXTENDED_RELEASE_TABLET | Freq: Every day | ORAL | Status: DC
  Administered 2017-11-29 – 2017-12-01 (×3): 20 meq via ORAL
  Filled 2017-11-28 (×3): qty 1

## 2017-11-28 NOTE — Consult Note (Signed)
Patient known from previous injuries. Left sided acute pelvic rami fractures. Reviewed CT scan, plain films, and notes.  Will treat nonoperatively. May mobilize WBAT with PT/OT. Will check on her progress tomorrow.  Altamese Ten Sleep, MD [facility] [phone] (p)

## 2017-11-28 NOTE — Progress Notes (Signed)
Pharmacy Antibiotic Note  Megan Roberson is a 62 y.o. female admitted on 11/25/2017 with cellulitis.  Pharmacy has been consulted for vancomycin dosing.  The patient's renal function remains stable - dose remains appropriate. When stable for po - given the patient's allergies, consider po Doxy or Clindamycin.   Plan: - Continue Vancomycin 750 mg IV every 12 hours, goal trough 10-15 mcg/mL - Will continue to follow renal function, culture results, LOT, and antibiotic de-escalation plans   Height: 5' 7"  (170.2 cm) Weight: 140 lb 6.9 oz (63.7 kg) IBW/kg (Calculated) : 61.6  Temp (24hrs), Avg:99.1 F (37.3 C), Min:98.2 F (36.8 C), Max:99.8 F (37.7 C)  Recent Labs  Lab 11/25/17 1539 11/25/17 2111 11/26/17 0415 11/26/17 0717 11/27/17 0903 11/28/17 0842  WBC 5.8  --  4.7  --   --  4.3  CREATININE 0.63  --   --  0.65 0.63 0.62  LATICACIDVEN  --  1.08  --   --   --   --     Estimated Creatinine Clearance: 73.6 mL/min (by C-G formula based on SCr of 0.62 mg/dL).    Allergies  Allergen Reactions  . Penicillins Swelling    FACIAL SWELLING  PATIENT HAS HAD A PCN REACTION WITH IMMEDIATE RASH, FACIAL/TONGUE/THROAT SWELLING, SOB, OR LIGHTHEADEDNESS WITH HYPOTENSION:  #  #  #  YES  #  #  #   Has patient had a PCN reaction causing severe rash involving mucus membranes or skin necrosis: No Has patient had a PCN reaction that required hospitalization: No Has patient had a PCN reaction occurring within the last 10 years: No If all of the above answers are "NO", then may proceed with Cephalosporin u  . Sulfa Antibiotics Itching and Rash    Antimicrobials this admission: Vanc 1/25 >>  Microbiology results: 1/24 blood x 2 >>  Thank you for allowing pharmacy to be a part of this patient's care.  Alycia Rossetti, PharmD, BCPS Clinical Pharmacist Pager: [phone] Clinical phone for 11/28/2017 from 7a-3:30p: [phone] If after 3:30p, please call main pharmacy at: [phone] 11/28/2017 11:27 AM

## 2017-11-28 NOTE — Progress Notes (Signed)
PROGRESS NOTE  Megan Roberson YKD:[id number] DOB: 07/12/1956 DOA: 11/25/2017 PCP: System, Pcp Not In  HPI/Recap of past 24 hours: Megan Roberson is a 62 y.o. female with medical history significant of liver cirrhosis, thrombocytopenia, hypothyroidism, depression, anxiety, hepatic encephalopathy, Ogilvie syndrome, who presents with pain in left groin area and left lower abdominal wall. Suspected cellulitis and started on IV antibiotics. Last paracentesis 11/18/17 with 5L fluid removed. Admitted for left abdominal wall cellulitis.   11/28/17: CT abd pelvis w contrast done 11/27/17 revealed new fracture left superior and inferior pubic ramus. Gas in in the large colon. Discussed with gen surgery Dr Redmond Pulling- deferred to GI.\  Patient reports abdominal pain. Denies nausea or vomiting. Had 1 bowel movement this am.    Assessment/Plan: Principal Problem:   Cellulitis, abdominal wall Active Problems:   Cirrhosis of liver (HCC)   Thrombocytopenia (HCC)   Ascites   SOB (shortness of breath)  Cellulitis, abdominal wall:  - Empiric antimicrobial treatment with vancomycin (pt received one dose of clindamycin in ED)  - PRN Zofran for nausea, morphine and oxcodon for pain - Blood cultures x 2 no growth  - ESR and CRP 4.3 - check lactic acid level normal - continue IV vancomycin  Cirrhosis of liver and ascites: -did not take her lasix  lactulose for more than a week.  -Mental status normal. -continue lactulose -added miralax due to constipation; self reported taking it at home daily -ammonia level 54 -INR 1.49 -resume diuretics -repeat ammonia level, CBC am  Suspected focal colitis -CT abd pelvis w contrast 11/27/17 revealed: Areas of wall thickening in the rectosigmoid and ascending colon may indicate areas of focal colitis or inflammatory bowel disease. Also consider portal hypertensive colopathy. -GI following-Defer to GI  Intractable pubic pain 2/2 to acute left pubic ramus  fracture -Had CT abd pelvis w contrast in 10/29/17. Did not have these symptoms at that time -repeat CT abd pelvis w contrast 11/27/17 -ortho surgery consulted Dr Marcelino Scot will see the patient. 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 -CBC am  Hyperammonemia -ammonia 54 -continue lactulose -monitor stool frequency -conitnue miralax daily in addition to scheduled lactulose -Ammonia am  Hypokalemia -K+ 3.2 -repleted -BMP am  Moderate protein calorie malnutrition 2/2 to cirrhosis -albumin 2.4 -closely monitor  Normocytic anemia -baseline 12 -hg 11.2 -no sign of overt bleeding   Code Status: full   Family Communication: none at bedside  Disposition Plan: will stay another midnight to continue IV antibiotics.  SEVERITY: Moderate to severe due to multiple commorbidities in the setting of advanced liver failure.   Consultants:  GI  Procedures:  none  Antimicrobials:  IV vanc   DVT prophylaxis:  SCds   Objective: Vitals:   11/27/17 0448 11/27/17 1529 11/27/17 2148 11/28/17 0501  BP: 104/61 120/65 118/64 102/62  Pulse: 89 70 68 63  Resp: 18 18 18 18   Temp: 98.5 F (36.9 C) 99.8 F (37.7 C) 99.3 F (37.4 C) 98.2 F (36.8 C)  TempSrc: Oral Oral Oral   SpO2: 90% 94% 95% 94%  Weight: 66.4 kg (146 lb 6.2 oz)   63.7 kg (140 lb 6.9 oz)  Height:        Intake/Output Summary (Last 24 hours) at 11/28/2017 0845 Last data filed at 11/28/2017 0555 Gross per 24 hour  Intake 300 ml  Output 3700 ml  Net -3400  ml   Filed Weights   11/26/17 0446 11/27/17 0448 11/28/17 0501  Weight: 67.1 kg (147 lb 14.9 oz) 66.4 kg (146 lb 6.2 oz) 63.7 kg (140 lb 6.9 oz)    Exam:11/28/17: seen and examined at her bedside.   General:  62 yo CF thin A&O x 3 appears uncomfortable due to abdominal pain   Cardiovascular: RRR no rubs or gallops    Respiratory: CTA no wheezes or rales  Abdomen: distended, left lower abdominal quadrant with erythema and warmth to touch. Diffused tenderness to touch of abdmone.   GU: swelling in pubic area very tender on palpation to the left. No erythema.  Musculoskeletal: non focal moves all 4   Skin: as stated above  Psychiatry: mood is appropriate for condition and setting   Data Reviewed: CBC: Recent Labs  Lab 11/25/17 1539 11/25/17 2049 11/26/17 0415  WBC 5.8  --  4.7  NEUTROABS  --  3.5  --   HGB 12.4  --  11.2*  HCT 40.0  --  35.7*  MCV 95.9  --  96.5  PLT 45*  --  44*   Basic Metabolic Panel: Recent Labs  Lab 11/25/17 1539 11/26/17 0717 11/27/17 0903  NA 142 140 138  K 3.6 3.2* 3.2*  CL 110 111 104  CO2 22 19* 24  GLUCOSE 88 121* 85  BUN 12 15 8   CREATININE 0.63 0.65 0.63  CALCIUM 8.4* 7.9* 8.1*   GFR: Estimated Creatinine Clearance: 73.6 mL/min (by C-G formula based on SCr of 0.63 mg/dL). Liver Function Tests: Recent Labs  Lab 11/25/17 1539 11/26/17 0717  AST 48* 40  ALT 15 14  ALKPHOS 180* 155*  BILITOT 2.5* 2.5*  PROT 5.8* 4.9*  ALBUMIN 2.5* 2.4*   No results for input(s): LIPASE, AMYLASE in the last 168 hours. Recent Labs  Lab 11/25/17 2334 11/27/17 0903  AMMONIA 54* 52*   Coagulation Profile: Recent Labs  Lab 11/25/17 2334  INR 1.49   Cardiac Enzymes: No results for input(s): CKTOTAL, CKMB, CKMBINDEX, TROPONINI in the last 168 hours. BNP (last 3 results) No results for input(s): PROBNP in the last 8760 hours. HbA1C: No results for input(s): HGBA1C in the last 72 hours. CBG: No results for input(s): GLUCAP in the last 168 hours. Lipid Profile: No results for input(s): CHOL, HDL, LDLCALC, TRIG, CHOLHDL, LDLDIRECT in the last 72 hours. Thyroid Function Tests: Recent Labs    11/26/17 0415  TSH 18.008*   Anemia Panel: No results for input(s): VITAMINB12, FOLATE, FERRITIN, TIBC, IRON, RETICCTPCT in the last 72 hours. Urine  analysis:    Component Value Date/Time   COLORURINE AMBER (A) 11/16/2017 1740   APPEARANCEUR HAZY (A) 11/16/2017 1740   LABSPEC 1.033 (H) 11/16/2017 1740   PHURINE 6.0 11/16/2017 1740   GLUCOSEU NEGATIVE 11/16/2017 1740   HGBUR LARGE (A) 11/16/2017 1740   BILIRUBINUR SMALL (A) 11/16/2017 1740   KETONESUR 5 (A) 11/16/2017 1740   PROTEINUR 100 (A) 11/16/2017 1740   NITRITE NEGATIVE 11/16/2017 1740   LEUKOCYTESUR NEGATIVE 11/16/2017 1740   Sepsis Labs: @LABRCNTIP (procalcitonin:4,lacticidven:4)  ) Recent Results (from the past 240 hour(s))  Culture, blood (routine x 2)     Status: None (Preliminary result)   Collection Time: 11/25/17  9:04 PM  Result Value Ref Range Status   Specimen Description BLOOD RIGHT ANTECUBITAL  Final   Special Requests   Final    BOTTLES DRAWN AEROBIC AND ANAEROBIC Blood Culture adequate volume   Culture NO GROWTH 2 DAYS  Final   Report Status PENDING  Incomplete  Culture, blood (routine x 2)     Status: None (Preliminary result)   Collection Time: 11/25/17  9:05 PM  Result Value Ref Range Status   Specimen Description BLOOD RIGHT ARM  Final   Special Requests   Final    BOTTLES DRAWN AEROBIC AND ANAEROBIC Blood Culture adequate volume   Culture NO GROWTH 2 DAYS  Final   Report Status PENDING  Incomplete      Studies: Ct Abdomen Pelvis W Contrast  Result Date: 11/28/2017 CLINICAL DATA:  Abdominal pain and fever. EXAM: CT ABDOMEN AND PELVIS WITH CONTRAST TECHNIQUE: Multidetector CT imaging of the abdomen and pelvis was performed using the standard protocol following bolus administration of intravenous contrast. CONTRAST:  100 mL Isovue-300 COMPARISON:  10/29/2017 FINDINGS: Lower chest: Airspace infiltrations in the right lower lung likely representing pneumonia. Small left pleural effusion with mild basilar atelectasis. Loculated appearing fluid collection in the right pleural space may represent empyema. Bilateral breast implants. Hepatobiliary: Hepatic  cirrhosis with diffuse nodular contour and enlarged lateral segment left and caudate lobes of the liver. No focal lesions identified. Cholelithiasis without evidence of cholecystitis. No bile duct dilatation. Portal veins are patent. Pancreas: Unremarkable. No pancreatic ductal dilatation or surrounding inflammatory changes. Spleen: Spleen is diffusely enlarged.  No focal lesions. Adrenals/Urinary Tract: No adrenal gland nodules. 7 x 11 mm stone in the right renal pelvis, unchanged since previous study. No obstructive changes. Nephrograms are symmetrical. Bladder is unremarkable. Stomach/Bowel: Rectosigmoid and ascending colon wall is thickened, possibly suggesting focal colitis or inflammatory bowel disease. Colon is mildly distended with stool and gas. Small bowel are not abnormally distended. Appendix is not identified Vascular/Lymphatic: Aortic atherosclerosis. No enlarged abdominal or pelvic lymph nodes. Reproductive: Uterus and bilateral adnexa are unremarkable. Other: Diffuse abdominal and pelvic ascites. No loculated fluid collections are identified. No free air. Edema in the subcutaneous fat of the abdomen and pelvis. Diffuse upper abdominal varices. Musculoskeletal: Internal fixation of the left hip. Left superior and inferior pubic ramus fractures, new since previous study. Old bilateral sacral ala fractures. Old fractures of the left iliac bones. Old compression fractures of T12 and L1. IMPRESSION: 1. Airspace disease in the right lower lung likely representing pneumonia. 2. Loculated appearing collection in the right pleural space may indicate empyema. 3. Hepatic cirrhosis with portal hypertension including upper abdominal varices and splenic enlargement. Diffuse abdominal and pelvic ascites. 4. Cholelithiasis without evidence of cholecystitis. 5. Areas of wall thickening in the rectosigmoid and ascending colon may indicate areas of focal colitis or inflammatory bowel disease. Also consider portal  hypertensive colopathy. 6. Nonobstructing stone in the right renal pelvis. 7. Aortic atherosclerosis. 8. Edema in the subcutaneous fat. New fractures of the left superior and inferior pubic ramus since previous study. 9. Old fractures of sacrum, left iliac bones, T12, and L1. Electronically Signed   By: Lucienne Capers M.D.   On: 11/28/2017 04:44    Scheduled Meds: . feeding supplement (ENSURE ENLIVE)  237 mL Oral TID BM  . furosemide  40 mg Oral Daily  . gabapentin  200 mg Oral TID  . lactulose  10 g Oral TID  . levothyroxine  50 mcg Oral QAC breakfast  . magnesium oxide  200 mg Oral Daily  . multivitamin with minerals  1 tablet Oral Daily  . polyethylene glycol  17 g Oral Daily  . QUEtiapine  25 mg Oral QHS  . rifaximin  550 mg Oral BID  .  senna-docusate  1 tablet Oral BID  . sertraline  100 mg Oral QHS  . spironolactone  100 mg Oral Daily  . traZODone  50 mg Oral QHS    Continuous Infusions: . vancomycin 750 mg (11/28/17 0539)     LOS: 3 days     Kayleen Memos, MD [facility] Pager [phone]  If 7PM-7AM, please contact night-coverage [email or web address] Password [facility] 11/28/2017, 8:45 AM

## 2017-11-28 NOTE — Progress Notes (Signed)
GI Progress Note covering for Drs. Mann & Hung   Subjective  Abdominal pain and distention unchanged   Objective  Vital signs in last 24 hours: Temp:  [98.2 F (36.8 C)-99.8 F (37.7 C)] 98.2 F (36.8 C) (01/27 0501) Pulse Rate:  [63-70] 63 (01/27 0501) Resp:  [18] 18 (01/27 0501) BP: (102-120)/(62-65) 102/62 (01/27 0501) SpO2:  [94 %-95 %] 94 % (01/27 0501) Weight:  [140 lb 6.9 oz (63.7 kg)] 140 lb 6.9 oz (63.7 kg) (01/27 0501) Last BM Date: 11/27/17  General: Alert, well-developed, chronically ill appearing, in NAD Heart:  Regular rate and rhythm; no murmurs Chest: Clear to ascultation bilaterally Abdomen:  Soft, moderately tender over cellulitis and mildly tender elsewhere. Moderately distended. Normal bowel sounds, without guarding, and without rebound.   Extremities:  Without edema. Neurologic:  Alert and  oriented x4; grossly normal neurologically. Psych:  Alert and cooperative. Normal mood and affect.  Intake/Output from previous day: 01/26 0701 - 01/27 0700 In: 300 [IV Piggyback:300] Out: 3700 [Urine:3700] Intake/Output this shift: No intake/output data recorded.  Lab Results: Recent Labs    11/25/17 1539 11/26/17 0415 11/28/17 0842  WBC 5.8 4.7 4.3  HGB 12.4 11.2* 10.9*  HCT 40.0 35.7* 34.0*  PLT 45* 44* 42*   BMET Recent Labs    11/26/17 0717 11/27/17 0903 11/28/17 0842  NA 140 138 137  K 3.2* 3.2* 3.2*  CL 111 104 101  CO2 19* 24 27  GLUCOSE 121* 85 90  BUN 15 8 6   CREATININE 0.65 0.63 0.62  CALCIUM 7.9* 8.1* 8.1*   LFT Recent Labs    11/25/17 1539  11/28/17 0842  PROT 5.8*   < > 5.3*  ALBUMIN 2.5*   < > 2.6*  AST 48*   < > 41  ALT 15   < > 14  ALKPHOS 180*   < > 158*  BILITOT 2.5*   < > 2.1*  BILIDIR 1.0*  --   --   IBILI 1.5*  --   --    < > = values in this interval not displayed.   PT/INR Recent Labs    11/25/17 2334  LABPROT 17.9*  INR 1.49     Studies/Results: Ct Abdomen Pelvis W Contrast  Result Date:  11/28/2017 CLINICAL DATA:  Abdominal pain and fever. EXAM: CT ABDOMEN AND PELVIS WITH CONTRAST TECHNIQUE: Multidetector CT imaging of the abdomen and pelvis was performed using the standard protocol following bolus administration of intravenous contrast. CONTRAST:  100 mL Isovue-300 COMPARISON:  10/29/2017 FINDINGS: Lower chest: Airspace infiltrations in the right lower lung likely representing pneumonia. Small left pleural effusion with mild basilar atelectasis. Loculated appearing fluid collection in the right pleural space may represent empyema. Bilateral breast implants. Hepatobiliary: Hepatic cirrhosis with diffuse nodular contour and enlarged lateral segment left and caudate lobes of the liver. No focal lesions identified. Cholelithiasis without evidence of cholecystitis. No bile duct dilatation. Portal veins are patent. Pancreas: Unremarkable. No pancreatic ductal dilatation or surrounding inflammatory changes. Spleen: Spleen is diffusely enlarged.  No focal lesions. Adrenals/Urinary Tract: No adrenal gland nodules. 7 x 11 mm stone in the right renal pelvis, unchanged since previous study. No obstructive changes. Nephrograms are symmetrical. Bladder is unremarkable. Stomach/Bowel: Rectosigmoid and ascending colon wall is thickened, possibly suggesting focal colitis or inflammatory bowel disease. Colon is mildly distended with stool and gas. Small bowel are not abnormally distended. Appendix is not identified Vascular/Lymphatic: Aortic atherosclerosis. No enlarged abdominal or pelvic lymph nodes. Reproductive: Uterus  and bilateral adnexa are unremarkable. Other: Diffuse abdominal and pelvic ascites. No loculated fluid collections are identified. No free air. Edema in the subcutaneous fat of the abdomen and pelvis. Diffuse upper abdominal varices. Musculoskeletal: Internal fixation of the left hip. Left superior and inferior pubic ramus fractures, new since previous study. Old bilateral sacral ala fractures.  Old fractures of the left iliac bones. Old compression fractures of T12 and L1. IMPRESSION: 1. Airspace disease in the right lower lung likely representing pneumonia. 2. Loculated appearing collection in the right pleural space may indicate empyema. 3. Hepatic cirrhosis with portal hypertension including upper abdominal varices and splenic enlargement. Diffuse abdominal and pelvic ascites. 4. Cholelithiasis without evidence of cholecystitis. 5. Areas of wall thickening in the rectosigmoid and ascending colon may indicate areas of focal colitis or inflammatory bowel disease. Also consider portal hypertensive colopathy. 6. Nonobstructing stone in the right renal pelvis. 7. Aortic atherosclerosis. 8. Edema in the subcutaneous fat. New fractures of the left superior and inferior pubic ramus since previous study. 9. Old fractures of sacrum, left iliac bones, T12, and L1. Electronically Signed   By: Lucienne Capers M.D.   On: 11/28/2017 04:44   Ir Paracentesis  Result Date: 11/26/2017 INDICATION: Patient with recurrent ascites. Request is made for therapeutic paracentesis. EXAM: ULTRASOUND GUIDED THERAPEUTIC PARACENTESIS MEDICATIONS: 5 mL 2% lidocaine COMPLICATIONS: None immediate. PROCEDURE: Informed written consent was obtained from the patient after a discussion of the risks, benefits and alternatives to treatment. A timeout was performed prior to the initiation of the procedure. Initial ultrasound scanning demonstrates a small amount of ascites within the right lower abdominal quadrant. The right lower abdomen was prepped and draped in the usual sterile fashion. 2% lidocaine with epinephrine was used for local anesthesia. Following this, a 19 gauge, 7-cm, Yueh catheter was introduced. An ultrasound image was saved for documentation purposes. The paracentesis was performed. The catheter was removed and a dressing was applied. The patient tolerated the procedure well without immediate post procedural complication.  FINDINGS: A total of approximately 2.2 L of clear, yellow fluid was removed. IMPRESSION: Successful ultrasound-guided therapeutic paracentesis yielding 2.2 liters of peritoneal fluid. Read by: Brynda Greathouse PA-C Electronically Signed   By: Lucrezia Europe M.D.   On: 11/26/2017 16:52     Assessment & Plan   1. Cirrhosis with ascites. Continue current diuretics. Follow Is/Os, daily weights, BMET. Consider increasing diuretics tomorrow.   2. Left abdominal wall cellulitis on IV vanco.   Drs. Collene Mares and Benson Norway to resume care on Monday.    LOS: 3 days   Norberto Sorenson T. Fuller Plan MD 11/28/2017, 10:33 AM

## 2017-11-29 DIAGNOSIS — W19XXXA Unspecified fall, initial encounter: Secondary | ICD-10-CM

## 2017-11-29 DIAGNOSIS — Y92009 Unspecified place in unspecified non-institutional (private) residence as the place of occurrence of the external cause: Secondary | ICD-10-CM

## 2017-11-29 DIAGNOSIS — R262 Difficulty in walking, not elsewhere classified: Secondary | ICD-10-CM

## 2017-11-29 LAB — COMPREHENSIVE METABOLIC PANEL
ALT: 14 U/L (ref 14–54)
AST: 40 U/L (ref 15–41)
Albumin: 2.4 g/dL — ABNORMAL LOW (ref 3.5–5.0)
Alkaline Phosphatase: 162 U/L — ABNORMAL HIGH (ref 38–126)
Anion gap: 11 (ref 5–15)
BUN: 8 mg/dL (ref 6–20)
CO2: 25 mmol/L (ref 22–32)
Calcium: 8.1 mg/dL — ABNORMAL LOW (ref 8.9–10.3)
Chloride: 100 mmol/L — ABNORMAL LOW (ref 101–111)
Creatinine, Ser: 0.59 mg/dL (ref 0.44–1.00)
GFR calc Af Amer: >60 mL/min (ref >60)
GFR calc non Af Amer: >60 mL/min (ref >60)
Glucose, Bld: 85 mg/dL (ref 65–99)
Potassium: 3.8 mmol/L (ref 3.5–5.1)
Sodium: 136 mmol/L (ref 135–145)
Total Bilirubin: 1.7 mg/dL — ABNORMAL HIGH (ref 0.3–1.2)
Total Protein: 5.1 g/dL — ABNORMAL LOW (ref 6.5–8.1)

## 2017-11-29 LAB — CBC
HCT: 31.9 % — ABNORMAL LOW (ref 36.0–46.0)
Hemoglobin: 10.1 g/dL — ABNORMAL LOW (ref 12.0–15.0)
MCH: 29.5 pg (ref 26.0–34.0)
MCHC: 31.7 g/dL (ref 30.0–36.0)
MCV: 93.3 fL (ref 78.0–100.0)
Platelets: 46 10*3/uL — ABNORMAL LOW (ref 150–400)
RBC: 3.42 MIL/uL — ABNORMAL LOW (ref 3.87–5.11)
RDW: 16.9 % — ABNORMAL HIGH (ref 11.5–15.5)
WBC: 4.7 10*3/uL (ref 4.0–10.5)

## 2017-11-29 LAB — AMMONIA: Ammonia: 75 umol/L — ABNORMAL HIGH (ref 9–35)

## 2017-11-29 MED ORDER — CLINDAMYCIN HCL 150 MG PO CAPS
150.0000 mg | ORAL_CAPSULE | Freq: Three times a day (TID) | ORAL | Status: DC
  Administered 2017-11-29 – 2017-12-01 (×7): 150 mg via ORAL
  Filled 2017-11-29 (×8): qty 1

## 2017-11-29 MED ORDER — LACTULOSE 10 GM/15ML PO SOLN
20.0000 g | Freq: Three times a day (TID) | ORAL | Status: DC
  Administered 2017-11-29 – 2017-12-01 (×7): 20 g via ORAL
  Filled 2017-11-29 (×7): qty 30

## 2017-11-29 NOTE — Care Management Important Message (Signed)
Important Message  Patient Details  Name: Megan Roberson MRN: [id number] Date of Birth: Dec 18, 1955   Medicare Important Message Given:  Yes    Chaquana Nichols 11/29/2017, 1:30 PM

## 2017-11-29 NOTE — Progress Notes (Deleted)
Benefits check in process for Lovenox 140 mg twice a day. CM to f/u with results. Whitman Hero RN, BSN,CM

## 2017-11-29 NOTE — Progress Notes (Signed)
Physical Therapy Treatment Patient Details Name: Megan Roberson MRN: [id number] DOB: Jul 12, 1956 Today's Date: 11/29/2017    History of Present Illness Pt is a 62 y.o. female with medical history significant of liver cirrhosis, thrombocytopenia, hypothyroidism, depression, anxiety, hepatic encephalopathy, Ogilvie syndrome, and recent fall with lumbar transverse process fractures and a S2 fracture.  She presented with pain in left groin area and left lower abdominal wall and SOB. She had a paracentesis done on 1/17.     PT Comments    Patient received in bed, pleasant and willing to participate with skilled PT services this morning however continuing to report high levels of pain with motion especially L groin. She is able to complete rolling and sidelying to sit with MinA today and was able to scoot forward at EOB with general min guard. She does continue to require heavy ModA for sit to stand and stand-pivot transfers but was able to perform stand to sit in controlled manner. She was educated regarding importance of being OOB as well this session, and was left up in the chair with all needs met and chair alarm activated.     Follow Up Recommendations  Home health PT;Supervision/Assistance - 24 hour     Equipment Recommendations  None recommended by PT    Recommendations for Other Services       Precautions / Restrictions Precautions Precautions: Fall Restrictions Weight Bearing Restrictions: No Other Position/Activity Restrictions: On last admission, pt issued TLSO due to spinal fractures. Pt reports she has not worn brace since discharge. She states now it will not fit over her ascites.     Mobility  Bed Mobility Overal bed mobility: Needs Assistance Bed Mobility: Rolling;Sidelying to Sit Rolling: Min assist Sidelying to sit: Min assist       General bed mobility comments: with railing, able to perform all bed mobility with much less effort today  Transfers Overall  transfer level: Needs assistance Equipment used: 1 person hand held assist Transfers: Sit to/from Stand;Stand Pivot Transfers Sit to Stand: Mod assist Stand pivot transfers: Mod assist       General transfer comment: continues to require Ursina for safe transfers, primarily limited by pain with weightbearing   Ambulation/Gait             General Gait Details: DNT due to high pain levels with transfer    Stairs            Wheelchair Mobility    Modified Rankin (Stroke Patients Only)       Balance Overall balance assessment: Needs assistance;History of Falls Sitting-balance support: Feet supported;Bilateral upper extremity supported Sitting balance-Leahy Scale: Fair Sitting balance - Comments: mild posterior lean, however did not lose balance at EOB  Postural control: Posterior lean Standing balance support: During functional activity Standing balance-Leahy Scale: Poor Standing balance comment: reliant on assistance for transfer, continue to note posterior lean                             Cognition Arousal/Alertness: Awake/alert Behavior During Therapy: WFL for tasks assessed/performed Overall Cognitive Status: Within Functional Limits for tasks assessed                                 General Comments: Motivated to participater in therapy and return to independence      Exercises      General Comments General comments (skin integrity,  edema, etc.): patient educated on importance of being OOB       Pertinent Vitals/Pain Pain Assessment: 0-10 Pain Score: 4  Pain Location: L groin, increases to 8/10 with transfer  Pain Descriptors / Indicators: Sore;Grimacing;Guarding;Moaning Pain Intervention(s): Limited activity within patient's tolerance;Monitored during session;Repositioned    Home Living                      Prior Function            PT Goals (current goals can now be found in the care plan section) Acute  Rehab PT Goals Patient Stated Goal: decrease pain PT Goal Formulation: With patient Time For Goal Achievement: 12/10/17 Potential to Achieve Goals: Good Progress towards PT goals: Progressing toward goals    Frequency    Min 3X/week      PT Plan Current plan remains appropriate    Co-evaluation              AM-PAC PT "6 Clicks" Daily Activity  Outcome Measure  Difficulty turning over in bed (including adjusting bedclothes, sheets and blankets)?: Unable Difficulty moving from lying on back to sitting on the side of the bed? : Unable Difficulty sitting down on and standing up from a chair with arms (e.g., wheelchair, bedside commode, etc,.)?: Unable Help needed moving to and from a bed to chair (including a wheelchair)?: A Lot Help needed walking in hospital room?: Total Help needed climbing 3-5 steps with a railing? : Total 6 Click Score: 7    End of Session Equipment Utilized During Treatment: Gait belt Activity Tolerance: Patient limited by pain Patient left: in chair;with chair alarm set;with call bell/phone within reach   PT Visit Diagnosis: Other abnormalities of gait and mobility (R26.89)     Time: 1007-1219 PT Time Calculation (min) (ACUTE ONLY): 23 min  Charges:  $Therapeutic Activity: 23-37 mins                    G Codes:       Deniece Ree PT, DPT, CBIS  Supplemental Physical Therapist [place]   Pager [phone]

## 2017-11-29 NOTE — Progress Notes (Signed)
PROGRESS NOTE  Megan Roberson UYE:[id number] DOB: 1956-05-17 DOA: 11/25/2017 PCP: System, Pcp Not In  HPI/Recap of past 24 hours: Megan Roberson is a 62 y.o. female with medical history significant of liver cirrhosis, thrombocytopenia, hypothyroidism, depression, anxiety, hepatic encephalopathy, Ogilvie syndrome, who presents with pain in left groin area and left lower abdominal wall. Suspected cellulitis and started on IV antibiotics. Last paracentesis 11/18/17 with 5L fluid removed. Admitted for left abdominal wall cellulitis.  11/28/17: CT abd pelvis w contrast done 11/27/17 revealed new fracture left superior and inferior pubic ramus. Gas in in the large colon. Discussed with gen surgery Dr Redmond Pulling- deferred to GI.\  11/29/17: Ammonia 75 from 54. Increased dose of lactulose. Pubic pain improved with current pain management. No new complaints.   Assessment/Plan: Principal Problem:   Cellulitis, abdominal wall Active Problems:   Cirrhosis of liver (HCC)   Thrombocytopenia (HCC)   Ascites   SOB (shortness of breath)  Cellulitis, abdominal wall:  - Empiric antimicrobial treatment with vancomycin (pt received one dose of clindamycin in ED)  - PRN Zofran for nausea, morphine and oxcodon for pain - Blood cultures x 2 no growth  - ESR and CRP 4.3 - check lactic acid level normal - completed 4 days of IV vancomycin -started po clindamycin today 11/29/17  Cirrhosis of liver and ascites: -did not take her lasix  lactulose for more than a week.  -Mental status normal. -continue lactulose -added miralax due to constipation; self reported taking it at home daily -ammonia level 54 -INR 1.49 -resume diuretics -repeat INR level am  Suspected focal colitis -CT abd pelvis w contrast 11/27/17 revealed: Areas of wall thickening in the rectosigmoid and ascending colon may indicate areas of focal colitis or inflammatory bowel disease. Also consider portal hypertensive colopathy. -GI  following-Defer to GI  Intractable pubic pain 2/2 to acute left pubic ramus fracture -Had CT abd pelvis w contrast in 10/29/17. Did not have these symptoms at that time -repeat CT abd pelvis w contrast 11/27/17 -ortho surgery consulted Dr Marcelino Scot will see the patient. 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 -CBC am  Hyperammonemia -ammonia 75 from 54 -continue lactulose -monitor stool frequency -conitnue miralax daily in addition to scheduled lactulose  Hypokalemia, resolved -K+ 3.8 from 3.2 -repleted -BMP am  Moderate protein calorie malnutrition 2/2 to cirrhosis -albumin 2.4 -closely monitor  Normocytic anemia -baseline 12 -hg 10.2 from 11.2 -no sign of overt bleeding   Code Status: full   Family Communication: none at bedside  Disposition Plan: will stay another midnight to continue antibiotics.  SEVERITY: Moderate to severe due to multiple commorbidities in the setting of advanced liver failure.   Consultants:  GI  Orthopedic surgery  Procedures:  none  Antimicrobials:  clindamycin  DVT prophylaxis:  SCds   Objective: Vitals:   11/28/17 1515 11/28/17 1515 11/28/17 2200 11/28/17 2300  BP: 113/66 113/66 117/71   Pulse: 72 72 67   Resp: _0 Temp: 98.4 F (36.9 C) 98.4 F (36.9 C) 100.1 F (37.8 C) 99.2 F (37.3 C)  TempSrc: Oral  Oral Oral  SpO2: 93% 93% 92%   Weight:      Height:        Intake/Output Summary (Last 24 hours) at 11/29/2017 0904 Last data filed at 11/28/2017 1724 Gross per 24 hour  Intake 150 ml  Output  1800 ml  Net -1650 ml   Filed Weights   11/26/17 0446 11/27/17 0448 11/28/17 0501  Weight: 67.1 kg (147 lb 14.9 oz) 66.4 kg (146 lb 6.2 oz) 63.7 kg (140 lb 6.9 oz)    Exam:11/29/17: seen and examined at her bedside.   General:  62 yo CF thin A&O x 3 NAD  Cardiovascular: RRR no  rubs or gallops   Respiratory: CTA no wheezes or rales  Abdomen: distended, left lower abdominal quadrant with erythema and warmth to touch. Diffused tenderness to touch of abdmone.   GU: swelling in pubic area very tender on palpation to the left. No erythema.  Musculoskeletal: non focal moves all 4   Skin: as stated above  Psychiatry: mood is appropriate for condition and setting   Data Reviewed: CBC: Recent Labs  Lab 11/25/17 1539 11/25/17 2049 11/26/17 0415 11/28/17 0842 11/29/17 0209  WBC 5.8  --  4.7 4.3 4.7  NEUTROABS  --  3.5  --   --   --   HGB 12.4  --  11.2* 10.9* 10.1*  HCT 40.0  --  35.7* 34.0* 31.9*  MCV 95.9  --  96.5 93.2 93.3  PLT 45*  --  44* 42* 46*   Basic Metabolic Panel: Recent Labs  Lab 11/25/17 1539 11/26/17 0717 11/27/17 0903 11/28/17 0842 11/29/17 0209  NA 142 140 138 137 136  K 3.6 3.2* 3.2* 3.2* 3.8  CL 110 111 104 101 100*  CO2 22 19* _0 GLUCOSE 88 121* 85 90 85  BUN _1 CREATININE 0.63 0.65 0.63 0.62 0.59  CALCIUM 8.4* 7.9* 8.1* 8.1* 8.1*   GFR: Estimated Creatinine Clearance: 73.6 mL/min (by C-G formula based on SCr of 0.59 mg/dL). Liver Function Tests: Recent Labs  Lab 11/25/17 1539 11/26/17 0717 11/28/17 0842 11/29/17 0209  AST 48* 40 41 40  ALT _2 ALKPHOS 180* 155* 158* 162*  BILITOT 2.5* 2.5* 2.1* 1.7*  PROT 5.8* 4.9* 5.3* 5.1*  ALBUMIN 2.5* 2.4* 2.6* 2.4*   No results for input(s): LIPASE, AMYLASE in the last 168 hours. Recent Labs  Lab 11/25/17 2334 11/27/17 0903 11/29/17 0209  AMMONIA 54* 52* 75*   Coagulation Profile: Recent Labs  Lab 11/25/17 2334  INR 1.49   Cardiac Enzymes: No results for input(s): CKTOTAL, CKMB, CKMBINDEX, TROPONINI in the last 168 hours. BNP (last 3 results) No results for input(s): PROBNP in the last 8760 hours. HbA1C: No results for input(s): HGBA1C in the last 72 hours. CBG: No results for input(s): GLUCAP in the last 168 hours. Lipid  Profile: No results for input(s): CHOL, HDL, LDLCALC, TRIG, CHOLHDL, LDLDIRECT in the last 72 hours. Thyroid Function Tests: No results for input(s): TSH, T4TOTAL, FREET4, T3FREE, THYROIDAB in the last 72 hours. Anemia Panel: No results for input(s): VITAMINB12, FOLATE, FERRITIN, TIBC, IRON, RETICCTPCT in the last 72 hours. Urine analysis:    Component Value Date/Time   COLORURINE AMBER (A) 11/16/2017 1740   APPEARANCEUR HAZY (A) 11/16/2017 1740   LABSPEC 1.033 (H) 11/16/2017 1740   PHURINE 6.0 11/16/2017 1740   GLUCOSEU NEGATIVE 11/16/2017 1740   HGBUR LARGE (A) 11/16/2017 1740   BILIRUBINUR SMALL (A) 11/16/2017 1740   KETONESUR 5 (A) 11/16/2017 1740   PROTEINUR 100 (A) 11/16/2017 1740   NITRITE NEGATIVE 11/16/2017 1740   LEUKOCYTESUR NEGATIVE 11/16/2017 1740   Sepsis Labs: _3 (procalcitonin:4,lacticidven:4)  ) Recent Results (from the past 240 hour(s))  Culture, blood (routine  x 2)     Status: None (Preliminary result)   Collection Time: 11/25/17  9:04 PM  Result Value Ref Range Status   Specimen Description BLOOD RIGHT ANTECUBITAL  Final   Special Requests   Final    BOTTLES DRAWN AEROBIC AND ANAEROBIC Blood Culture adequate volume   Culture NO GROWTH 3 DAYS  Final   Report Status PENDING  Incomplete  Culture, blood (routine x 2)     Status: None (Preliminary result)   Collection Time: 11/25/17  9:05 PM  Result Value Ref Range Status   Specimen Description BLOOD RIGHT ARM  Final   Special Requests   Final    BOTTLES DRAWN AEROBIC AND ANAEROBIC Blood Culture adequate volume   Culture NO GROWTH 3 DAYS  Final   Report Status PENDING  Incomplete      Studies: No results found.  Scheduled Meds: . feeding supplement (ENSURE ENLIVE)  237 mL Oral TID BM  . furosemide  40 mg Oral Daily  . gabapentin  300 mg Oral TID  . lactulose  10 g Oral TID  . levothyroxine  75 mcg Oral QAC breakfast  . magnesium oxide  200 mg Oral Daily  . multivitamin with minerals  1  tablet Oral Daily  . polyethylene glycol  17 g Oral Daily  . potassium chloride  20 mEq Oral Daily  . QUEtiapine  25 mg Oral QHS  . rifaximin  550 mg Oral BID  . senna-docusate  1 tablet Oral BID  . sertraline  100 mg Oral QHS  . spironolactone  100 mg Oral Daily  . traZODone  50 mg Oral QHS    Continuous Infusions: . vancomycin 750 mg (11/29/17 0506)     LOS: 4 days     Kayleen Memos, MD [facility] Pager [phone]  If 7PM-7AM, please contact night-coverage [email or web address] Password [facility] 11/29/2017, 9:04 AM

## 2017-11-29 NOTE — Progress Notes (Addendum)
UNASSIGNED PATIENT Subjective: [name] is a 62 year old white female with a history of alcoholic cirrhosis, thrombocytopenia, portal hypertension hepatic encephalopathy an Ogilvie syndrome who was admitted for cellulitis of the abdominal wall on antibiotics and noted to have fracture of the superior inferior ramus of the pelvis on left side today. She complains of severe abdominal pain and distention. She is passing flatus but has not had a BM in over 24 hours. CT scan shows diffuse ascites in the abdomen and pelvis her last paracentesis was on 11/18/2017 and 5 L of fluid was removed.  Objective: Vital signs in last 24 hours: Temp:  [98.4 F (36.9 C)-100.1 F (37.8 C)] 99.2 F (37.3 C) (01/27 2300) Pulse Rate:  [67-72] 67 (01/27 2200) Resp:  [16-18] 18 (01/27 2200) BP: (113-117)/(66-71) 117/71 (01/27 2200) SpO2:  [92 %-93 %] 92 % (01/27 2200) Last BM Date: 11/28/17  Intake/Output from previous day: 01/27 0701 - 01/28 0700 In: 150 [IV Piggyback:150] Out: 1800 [Urine:1800] Intake/Output this shift: Total I/O In: 120 [P.O.:120] Out: -   General appearance: alert, cooperative, appears older than stated age, cachectic, icteric, moderate distress and pale Resp: diminished breath sounds bilaterally Cardio: regular rate and rhythm, S1, S2 normal, no murmur, click, rub or gallop GI: soft, diffusely tender on palpation, distended with a small reducible umbilical hernia, high pitched bowelsounds, no masses palpable Extremities: limted mobiity due to pelvic fractures  Lab Results: Recent Labs    11/28/17 0842 11/29/17 0209  WBC 4.3 4.7  HGB 10.9* 10.1*  HCT 34.0* 31.9*  PLT 42* 46*   BMET Recent Labs    11/27/17 0903 11/28/17 0842 11/29/17 0209  NA 138 137 136  K 3.2* 3.2* 3.8  CL 104 101 100*  CO2 24 27 25   GLUCOSE 85 90 85  BUN 8 6 8   CREATININE 0.63 0.62 0.59  CALCIUM 8.1* 8.1* 8.1*   LFT Recent Labs    11/29/17 0209  PROT 5.1*  ALBUMIN 2.4*  AST 40   ALT 14  ALKPHOS 162*  BILITOT 1.7*   Studies/Results: Ct Abdomen Pelvis W Contrast  Result Date: 11/28/2017 CLINICAL DATA:  Abdominal pain and fever. EXAM: CT ABDOMEN AND PELVIS WITH CONTRAST TECHNIQUE: Multidetector CT imaging of the abdomen and pelvis was performed using the standard protocol following bolus administration of intravenous contrast. CONTRAST:  100 mL Isovue-300 COMPARISON:  10/29/2017 FINDINGS: Lower chest: Airspace infiltrations in the right lower lung likely representing pneumonia. Small left pleural effusion with mild basilar atelectasis. Loculated appearing fluid collection in the right pleural space may represent empyema. Bilateral breast implants. Hepatobiliary: Hepatic cirrhosis with diffuse nodular contour and enlarged lateral segment left and caudate lobes of the liver. No focal lesions identified. Cholelithiasis without evidence of cholecystitis. No bile duct dilatation. Portal veins are patent. Pancreas: Unremarkable. No pancreatic ductal dilatation or surrounding inflammatory changes. Spleen: Spleen is diffusely enlarged.  No focal lesions. Adrenals/Urinary Tract: No adrenal gland nodules. 7 x 11 mm stone in the right renal pelvis, unchanged since previous study. No obstructive changes. Nephrograms are symmetrical. Bladder is unremarkable. Stomach/Bowel: Rectosigmoid and ascending colon wall is thickened, possibly suggesting focal colitis or inflammatory bowel disease. Colon is mildly distended with stool and gas. Small bowel are not abnormally distended. Appendix is not identified Vascular/Lymphatic: Aortic atherosclerosis. No enlarged abdominal or pelvic lymph nodes. Reproductive: Uterus and bilateral adnexa are unremarkable. Other: Diffuse abdominal and pelvic ascites. No loculated fluid collections are identified. No free air. Edema in the subcutaneous fat of the abdomen  and pelvis. Diffuse upper abdominal varices. Musculoskeletal: Internal fixation of the left hip. Left  superior and inferior pubic ramus fractures, new since previous study. Old bilateral sacral ala fractures. Old fractures of the left iliac bones. Old compression fractures of T12 and L1. IMPRESSION: 1. Airspace disease in the right lower lung likely representing pneumonia. 2. Loculated appearing collection in the right pleural space may indicate empyema. 3. Hepatic cirrhosis with portal hypertension including upper abdominal varices and splenic enlargement. Diffuse abdominal and pelvic ascites. 4. Cholelithiasis without evidence of cholecystitis. 5. Areas of wall thickening in the rectosigmoid and ascending colon may indicate areas of focal colitis or inflammatory bowel disease. Also consider portal hypertensive colopathy. 6. Nonobstructing stone in the right renal pelvis. 7. Aortic atherosclerosis. 8. Edema in the subcutaneous fat. New fractures of the left superior and inferior pubic ramus since previous study. 9. Old fractures of sacrum, left iliac bones, T12, and L1. Electronically Signed   By: Lucienne Capers M.D.   On: 11/28/2017 04:44   Medications: I have reviewed the patient's current medications.  Assessment/Plan: 1) Alcoholic cirrhosis with thrombocytopenia, encephalopathy,portal hypertension ascites, umbilical hernia and splenomegaly; AVOID ALL NSAIDS . 2) Chronic constipation/Ogilvie syndrome-worsened with the use of narcotics continue MiraLAX.;  3) Protein calorie malnutrition. 4) Cellulitis of the anterior abdominal wall antibiotics. 5) Pelvic fracture off the superior and inferior ramus on the left side.   LOS: 4 days   Naziyah Tieszen 11/29/2017, 9:42 AM

## 2017-11-30 DIAGNOSIS — S32502A Unspecified fracture of left pubis, initial encounter for closed fracture: Secondary | ICD-10-CM

## 2017-11-30 LAB — CULTURE, BLOOD (ROUTINE X 2)
Culture: NO GROWTH
Culture: NO GROWTH
Special Requests: ADEQUATE
Special Requests: ADEQUATE

## 2017-11-30 LAB — AMMONIA: Ammonia: 63 umol/L — ABNORMAL HIGH (ref 9–35)

## 2017-11-30 MED ORDER — FUROSEMIDE 80 MG PO TABS
80.0000 mg | ORAL_TABLET | Freq: Every day | ORAL | Status: DC
  Administered 2017-11-30 – 2017-12-01 (×2): 80 mg via ORAL
  Filled 2017-11-30 (×2): qty 1

## 2017-11-30 MED ORDER — SPIRONOLACTONE 25 MG PO TABS
200.0000 mg | ORAL_TABLET | Freq: Every day | ORAL | Status: DC
  Administered 2017-11-30 – 2017-12-01 (×2): 200 mg via ORAL
  Filled 2017-11-30 (×2): qty 8

## 2017-11-30 NOTE — Progress Notes (Addendum)
PROGRESS NOTE  Megan Roberson IAX:[id number] DOB: 1956/08/28 DOA: 11/25/2017 PCP: System, Pcp Not In  HPI/Recap of past 24 hours: Megan Roberson is a 62 y.o. female with medical history significant of liver cirrhosis, thrombocytopenia, hypothyroidism, depression, anxiety, hepatic encephalopathy, Ogilvie syndrome, who presents with pain in left groin area and left lower abdominal wall. Suspected cellulitis and started on IV antibiotics. Last paracentesis 11/18/17 with 5L fluid removed. Admitted for left abdominal wall cellulitis.  11/28/17: CT abd pelvis w contrast done 11/27/17 revealed new fracture left superior and inferior pubic ramus. Gas in in the large colon. Discussed with gen surgery Dr Redmond Pulling- deferred to GI.\ 11/29/17: Ammonia 75 from 54. Increased dose of lactulose. Pubic pain improved with current pain management.  11/30/17: no new complaints. GI increasing diuretics dose. Monitor closely.  Assessment/Plan: Principal Problem:   Cellulitis, abdominal wall Active Problems:   Cirrhosis of liver (HCC)   Thrombocytopenia (HCC)   Ascites   SOB (shortness of breath)  Cellulitis, abdominal wall, unable to determine if complication from paracentesis:  - Empiric antimicrobial treatment with vancomycin (pt received one dose of clindamycin in ED)  - PRN Zofran for nausea, morphine and oxcodon for pain - Blood cultures x 2 no growth  - ESR and CRP 4.3 - check lactic acid level normal - completed 4 days of IV vancomycin -started po clindamycin 11/29/17  Cirrhosis of liver and ascites: -did not take her lasix  lactulose for more than a week.  -Mental status normal. -continue lactulose -added miralax due to constipation; self reported taking it at home daily -ammonia level 54 -INR 1.49 -dose of diuretics increased -repeat INR level am  Suspected focal colitis -CT abd pelvis w contrast 11/27/17 revealed: Areas of wall thickening in the rectosigmoid and ascending colon may indicate  areas of focal colitis or inflammatory bowel disease. Also consider portal hypertensive colopathy. -GI following-Defer to GI  Intractable pubic pain 2/2 to acute left pubic ramus fracture -Had CT abd pelvis w contrast in 10/29/17. Did not have these symptoms at that time -repeat CT abd pelvis w contrast 11/27/17 -ortho surgery consulted Dr Marcelino Scot will see the patient. 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 -CBC am  Hyperammonemia -ammonia 75 from 54 -continue lactulose -monitor stool frequency -conitnue miralax daily in addition to scheduled lactulose  Hypokalemia, resolved -K+ 3.8 from 3.2 -repleted -BMP am  Moderate protein calorie malnutrition 2/2 to cirrhosis -albumin 2.4 -closely monitor  Normocytic anemia -baseline 12 -hg 10.2 from 11.2 -no sign of overt bleeding   Code Status: full   Family Communication: none at bedside  Disposition Plan: will stay another midnight to continue aggressive diuretics.  SEVERITY: Moderate to severe due to multiple commorbidities in the setting of advanced liver failure.   Consultants:  GI  Orthopedic surgery  Procedures:  none  Antimicrobials:  clindamycin  DVT prophylaxis:  SCds   Objective: Vitals:   11/29/17 2156 11/30/17 0506 11/30/17 0543 11/30/17 1343  BP: (!) 100/58 (!) 107/58  (!) 96/53  Pulse: 73 63  65  Resp: _0 Temp: 99.6 F (37.6 C) 99.1 F (37.3 C)  99.5 F (37.5 C)  TempSrc: Oral Oral  Oral  SpO2: 93% 93%  93%  Weight:   56.7 kg (125 lb 0 oz)   Height:        Intake/Output Summary (Last 24  hours) at 11/30/2017 1752 Last data filed at 11/30/2017 1439 Gross per 24 hour  Intake 582 ml  Output 1000 ml  Net -418 ml   Filed Weights   11/27/17 0448 11/28/17 0501 11/30/17 0543  Weight: 66.4 kg (146 lb 6.2 oz) 63.7 kg (140 lb 6.9 oz) 56.7 kg (125 lb 0  oz)    Exam:11/30/17: seen and examined at her bedside.   General:  62 yo CF thin A&O x 3 NAD  Cardiovascular: RRR no rubs or gallops   Respiratory: CTA no wheezes or rales  Abdomen: distended, left lower abdominal quadrant with erythema and warmth to touch. Diffused tenderness to touch of abdmone.   GU: swelling in pubic area very tender on palpation to the left. No erythema.  Musculoskeletal: non focal moves all 4   Skin: as stated above  Psychiatry: mood is appropriate for condition and setting   Data Reviewed: CBC: Recent Labs  Lab 11/25/17 1539 11/25/17 2049 11/26/17 0415 11/28/17 0842 11/29/17 0209  WBC 5.8  --  4.7 4.3 4.7  NEUTROABS  --  3.5  --   --   --   HGB 12.4  --  11.2* 10.9* 10.1*  HCT 40.0  --  35.7* 34.0* 31.9*  MCV 95.9  --  96.5 93.2 93.3  PLT 45*  --  44* 42* 46*   Basic Metabolic Panel: Recent Labs  Lab 11/25/17 1539 11/26/17 0717 11/27/17 0903 11/28/17 0842 11/29/17 0209  NA 142 140 138 137 136  K 3.6 3.2* 3.2* 3.2* 3.8  CL 110 111 104 101 100*  CO2 22 19* _0 GLUCOSE 88 121* 85 90 85  BUN _1 CREATININE 0.63 0.65 0.63 0.62 0.59  CALCIUM 8.4* 7.9* 8.1* 8.1* 8.1*   GFR: Estimated Creatinine Clearance: 67.8 mL/min (by C-G formula based on SCr of 0.59 mg/dL). Liver Function Tests: Recent Labs  Lab 11/25/17 1539 11/26/17 0717 11/28/17 0842 11/29/17 0209  AST 48* 40 41 40  ALT _2 ALKPHOS 180* 155* 158* 162*  BILITOT 2.5* 2.5* 2.1* 1.7*  PROT 5.8* 4.9* 5.3* 5.1*  ALBUMIN 2.5* 2.4* 2.6* 2.4*   No results for input(s): LIPASE, AMYLASE in the last 168 hours. Recent Labs  Lab 11/25/17 2334 11/27/17 0903 11/29/17 0209 11/30/17 0900  AMMONIA 54* 52* 75* 63*   Coagulation Profile: Recent Labs  Lab 11/25/17 2334  INR 1.49   Cardiac Enzymes: No results for input(s): CKTOTAL, CKMB, CKMBINDEX, TROPONINI in the last 168 hours. BNP (last 3 results) No results for input(s): PROBNP in the last 8760  hours. HbA1C: No results for input(s): HGBA1C in the last 72 hours. CBG: No results for input(s): GLUCAP in the last 168 hours. Lipid Profile: No results for input(s): CHOL, HDL, LDLCALC, TRIG, CHOLHDL, LDLDIRECT in the last 72 hours. Thyroid Function Tests: No results for input(s): TSH, T4TOTAL, FREET4, T3FREE, THYROIDAB in the last 72 hours. Anemia Panel: No results for input(s): VITAMINB12, FOLATE, FERRITIN, TIBC, IRON, RETICCTPCT in the last 72 hours. Urine analysis:    Component Value Date/Time   COLORURINE AMBER (A) 11/16/2017 1740   APPEARANCEUR HAZY (A) 11/16/2017 1740   LABSPEC 1.033 (H) 11/16/2017 1740   PHURINE 6.0 11/16/2017 1740   GLUCOSEU NEGATIVE 11/16/2017 1740   HGBUR LARGE (A) 11/16/2017 1740   BILIRUBINUR SMALL (A) 11/16/2017 1740   KETONESUR 5 (A) 11/16/2017 1740   PROTEINUR 100 (A) 11/16/2017 1740   NITRITE NEGATIVE 11/16/2017 1740  LEUKOCYTESUR NEGATIVE 11/16/2017 1740   Sepsis Labs: _0 (procalcitonin:4,lacticidven:4)  ) Recent Results (from the past 240 hour(s))  Culture, blood (routine x 2)     Status: None   Collection Time: 11/25/17  9:04 PM  Result Value Ref Range Status   Specimen Description BLOOD RIGHT ANTECUBITAL  Final   Special Requests   Final    BOTTLES DRAWN AEROBIC AND ANAEROBIC Blood Culture adequate volume   Culture NO GROWTH 5 DAYS  Final   Report Status 11/30/2017 FINAL  Final  Culture, blood (routine x 2)     Status: None   Collection Time: 11/25/17  9:05 PM  Result Value Ref Range Status   Specimen Description BLOOD RIGHT ARM  Final   Special Requests   Final    BOTTLES DRAWN AEROBIC AND ANAEROBIC Blood Culture adequate volume   Culture NO GROWTH 5 DAYS  Final   Report Status 11/30/2017 FINAL  Final      Studies: No results found.  Scheduled Meds: . clindamycin  150 mg Oral Q8H  . feeding supplement (ENSURE ENLIVE)  237 mL Oral TID BM  . furosemide  80 mg Oral Daily  . gabapentin  300 mg Oral TID  . lactulose   20 g Oral TID  . levothyroxine  75 mcg Oral QAC breakfast  . magnesium oxide  200 mg Oral Daily  . multivitamin with minerals  1 tablet Oral Daily  . polyethylene glycol  17 g Oral Daily  . potassium chloride  20 mEq Oral Daily  . QUEtiapine  25 mg Oral QHS  . rifaximin  550 mg Oral BID  . senna-docusate  1 tablet Oral BID  . sertraline  100 mg Oral QHS  . spironolactone  200 mg Oral Daily  . traZODone  50 mg Oral QHS    Continuous Infusions:    LOS: 5 days     Kayleen Memos, MD [facility] Pager [phone]  If 7PM-7AM, please contact night-coverage [email or web address] Password [facility] 11/30/2017, 5:52 PM

## 2017-11-30 NOTE — Progress Notes (Signed)
Subjective: No GI complaints.  Objective: Vital signs in last 24 hours: Temp:  [98.1 F (36.7 C)-99.6 F (37.6 C)] 99.1 F (37.3 C) (01/29 0506) Pulse Rate:  [63-73] 63 (01/29 0506) Resp:  [16-19] 18 (01/29 0506) BP: (100-116)/(58-73) 107/58 (01/29 0506) SpO2:  [92 %-93 %] 93 % (01/29 0506) Weight:  [56.7 kg (125 lb 0 oz)] 56.7 kg (125 lb 0 oz) (01/29 0543) Last BM Date: 11/28/17  Intake/Output from previous day: 01/28 0701 - 01/29 0700 In: 600 [P.O.:600] Out: 2800 [Urine:2800] Intake/Output this shift: No intake/output data recorded.  General appearance: alert and no distress GI: distended with ascites, still moderately tense  Lab Results: Recent Labs    11/28/17 0842 11/29/17 0209  WBC 4.3 4.7  HGB 10.9* 10.1*  HCT 34.0* 31.9*  PLT 42* 46*   BMET Recent Labs    11/27/17 0903 11/28/17 0842 11/29/17 0209  NA 138 137 136  K 3.2* 3.2* 3.8  CL 104 101 100*  CO2 24 27 25   GLUCOSE 85 90 85  BUN 8 6 8   CREATININE 0.63 0.62 0.59  CALCIUM 8.1* 8.1* 8.1*   LFT Recent Labs    11/29/17 0209  PROT 5.1*  ALBUMIN 2.4*  AST 40  ALT 14  ALKPHOS 162*  BILITOT 1.7*   PT/INR No results for input(s): LABPROT, INR in the last 72 hours. Hepatitis Panel No results for input(s): HEPBSAG, HCVAB, HEPAIGM, HEPBIGM in the last 72 hours. C-Diff No results for input(s): CDIFFTOX in the last 72 hours. Fecal Lactopherrin No results for input(s): FECLLACTOFRN in the last 72 hours.  Studies/Results: No results found.  Medications:  Scheduled: . clindamycin  150 mg Oral Q8H  . feeding supplement (ENSURE ENLIVE)  237 mL Oral TID BM  . furosemide  40 mg Oral Daily  . gabapentin  300 mg Oral TID  . lactulose  20 g Oral TID  . levothyroxine  75 mcg Oral QAC breakfast  . magnesium oxide  200 mg Oral Daily  . multivitamin with minerals  1 tablet Oral Daily  . polyethylene glycol  17 g Oral Daily  . potassium chloride  20 mEq Oral Daily  . QUEtiapine  25 mg Oral QHS  .  rifaximin  550 mg Oral BID  . senna-docusate  1 tablet Oral BID  . sertraline  100 mg Oral QHS  . spironolactone  100 mg Oral Daily  . traZODone  50 mg Oral QHS   Continuous:   Assessment/Plan: 1) Decompensated cirrhosis. 2) Ascites.   Only 2 liters was removed with the paracentesis and she is diuresing well.  Her weight has dropped from 63 kg down to 56 kg.  Her creatinine is stable at this time.  She was mildly hypokalemic a couple of days ago.  Plan: 1) Increase to Step II diuretics. 2) Continue with low sodium diet. 3) Hip management per Ortho.  LOS: 5 days   Megan Roberson D 11/30/2017, 7:12 AM

## 2017-12-01 DIAGNOSIS — D696 Thrombocytopenia, unspecified: Secondary | ICD-10-CM

## 2017-12-01 DIAGNOSIS — K7031 Alcoholic cirrhosis of liver with ascites: Secondary | ICD-10-CM

## 2017-12-01 LAB — BASIC METABOLIC PANEL
Anion gap: 11 (ref 5–15)
BUN: 9 mg/dL (ref 6–20)
CO2: 26 mmol/L (ref 22–32)
Calcium: 8.3 mg/dL — ABNORMAL LOW (ref 8.9–10.3)
Chloride: 96 mmol/L — ABNORMAL LOW (ref 101–111)
Creatinine, Ser: 0.77 mg/dL (ref 0.44–1.00)
GFR calc Af Amer: >60 mL/min (ref >60)
GFR calc non Af Amer: >60 mL/min (ref >60)
Glucose, Bld: 101 mg/dL — ABNORMAL HIGH (ref 65–99)
Potassium: 4.1 mmol/L (ref 3.5–5.1)
Sodium: 133 mmol/L — ABNORMAL LOW (ref 135–145)

## 2017-12-01 LAB — CBC WITH DIFFERENTIAL/PLATELET
Basophils Absolute: 0.1 10*3/uL (ref 0.0–0.1)
Basophils Relative: 1 %
Eosinophils Absolute: 0.1 10*3/uL (ref 0.0–0.7)
Eosinophils Relative: 2 %
HCT: 34.8 % — ABNORMAL LOW (ref 36.0–46.0)
Hemoglobin: 11 g/dL — ABNORMAL LOW (ref 12.0–15.0)
Lymphocytes Relative: 11 %
Lymphs Abs: 0.7 10*3/uL (ref 0.7–4.0)
MCH: 29.6 pg (ref 26.0–34.0)
MCHC: 31.6 g/dL (ref 30.0–36.0)
MCV: 93.5 fL (ref 78.0–100.0)
Monocytes Absolute: 1.2 10*3/uL — ABNORMAL HIGH (ref 0.1–1.0)
Monocytes Relative: 20 %
Neutro Abs: 4 10*3/uL (ref 1.7–7.7)
Neutrophils Relative %: 66 %
Platelets: 50 10*3/uL — ABNORMAL LOW (ref 150–400)
RBC: 3.72 MIL/uL — ABNORMAL LOW (ref 3.87–5.11)
RDW: 16.5 % — ABNORMAL HIGH (ref 11.5–15.5)
WBC: 6 10*3/uL (ref 4.0–10.5)

## 2017-12-01 MED ORDER — SPIRONOLACTONE 100 MG PO TABS: 200.0000 mg | ORAL_TABLET | Freq: Every day | ORAL | 0 refills | Status: DC

## 2017-12-01 MED ORDER — ENSURE ENLIVE PO LIQD: 237.0000 mL | Freq: Three times a day (TID) | ORAL | 12 refills | Status: DC

## 2017-12-01 MED ORDER — SENNOSIDES-DOCUSATE SODIUM 8.6-50 MG PO TABS: 1.0000 | ORAL_TABLET | Freq: Two times a day (BID) | ORAL | 0 refills | Status: DC

## 2017-12-01 MED ORDER — LEVOTHYROXINE SODIUM 75 MCG PO TABS: 75.0000 ug | ORAL_TABLET | Freq: Every day | ORAL | 0 refills | Status: DC

## 2017-12-01 MED ORDER — LACTULOSE 10 GM/15ML PO SOLN: 20.0000 g | Freq: Three times a day (TID) | ORAL | 0 refills | Status: DC

## 2017-12-01 MED ORDER — FUROSEMIDE 80 MG PO TABS: 80.0000 mg | ORAL_TABLET | Freq: Every day | ORAL | 0 refills | Status: DC

## 2017-12-01 MED ORDER — OXYCODONE HCL 5 MG PO TABS: 5.0000 mg | ORAL_TABLET | Freq: Four times a day (QID) | ORAL | 0 refills | Status: DC | PRN

## 2017-12-01 MED ORDER — POTASSIUM CHLORIDE CRYS ER 20 MEQ PO TBCR: 20.0000 meq | EXTENDED_RELEASE_TABLET | Freq: Every day | ORAL | 0 refills | Status: DC

## 2017-12-01 NOTE — Consult Note (Signed)
   [facility] CM Inpatient Consult   12/01/2017  Ondria Oswald 12-28-1955 [id number]  Met with patient and spouse, [name] at the bedside. Patient states she had an appointment yesterday with Dr. Nevada Crane in [place] but ended up in the hospital. She states she could not remember his first name Thank you for this consult.  Patient evaluated for [facility] services.  Patient is not currently a beneficiary of the attributed [place]sie in the [employer].   Patient does not have a primary physician in the network yet. She states the only real problem she has is that she can't get her pain medication like she needs them.  Explained to the patient and spouse that once care is establish that physician could contact [facility] if in network.   Reason:  Not a beneficiary currently attributed to one of the [place].  Membership roster was used to verify non- eligible status. Updated inpatient RNCM, referral regarding the barriers for [facility] to follow.  Patient will have home care with [facility].  Natividad Brood, RN BSN [facility] Liaison  [phone] business mobile phone Toll free office [phone]

## 2017-12-01 NOTE — Discharge Summary (Signed)
Physician Discharge Summary  Megan Roberson PJK:[id number] DOB: 1956/07/01 DOA: 11/25/2017  PCP: System, Pcp Not In  Admit date: 11/25/2017 Discharge date: 12/01/2017  Admitted From: Home Disposition: Home  Recommendations for Outpatient Follow-up:  1. Follow up with PCP in 1week with repeat CBC/BMP 2. Follow-up with gastroenterology at [facility] in 1 week 3. Follow-up with orthopedics/Dr. Marcelino Scot in 1-2 weeks  Home Health: Yes Equipment/Devices: No  Discharge Condition: Guarded CODE STATUS: Full Diet recommendation: Heart Healthy   Brief/Interim Summary: 62 year old female with history of liver cirrhosis with ascites status post last paracentesis on 11/18/2017 with 5 L fluid removed, thrombocytopenia, hypothyroidism, depression, anxiety, hepatic encephalopathy, Ogilvie syndrome presented with left groin area and lower abdominal wall pain on 11/25/2017.  She was admitted for left abdominal wall cellulitis.  CT abdomen and pelvis done on 11/27/2017 revealed a new fracture of left superior and inferior pubic ramus.  Orthopedics recommended conservative medical management.  GI was consulted during hospitalization, lactulose dose was increased.  Diuretics does have been increased by GI.  GI has cleared the patient for discharge.  Discharge Diagnoses:  Principal Problem:   Cellulitis, abdominal wall Active Problems:   Cirrhosis of liver (HCC)   Thrombocytopenia (HCC)   Ascites   SOB (shortness of breath)  Abdominal wall cellulitis -Empirically treated with vancomycin which has been switched to oral clindamycin. -Hemodynamically stable.  Cultures negative so far. -Right cellulitis has much improved.  Patient will not need any more antibiotics upon discharge.    Decompensated cirrhosis of liver with ascites and hepatic encephalopathy and noncompliance -GI following.  Currently on Lasix 80 mg daily and spironolactone 200 mg daily which will be continued.  GI has cleared the  patient for discharge.  Patient will need outpatient GI follow-up at [facility].    -Mental status normal. -continue lactulose -Outpatient follow-up of renal function   Intractable pubic pain due to acute left pubic ramus fracture -Orthopedics recommend conservative medical management.  Outpatient follow-up with orthopedics.  Cautious use of pain medications.  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 -Outpatient follow-up  Hypokalemia, resolved -Outpatient follow-up  Moderate protein calorie malnutrition due to cirrhosis -Outpatient follow-up  Normocytic anemia -Hemoglobin stable.     Discharge Instructions  Discharge Instructions    Call MD for:  difficulty breathing, headache or visual disturbances   Complete by:  As directed    Call MD for:  extreme fatigue   Complete by:  As directed    Call MD for:  hives   Complete by:  As directed    Call MD for:  persistant dizziness or light-headedness   Complete by:  As directed    Call MD for:  persistant nausea and vomiting   Complete by:  As directed    Call MD for:  severe uncontrolled pain   Complete by:  As directed    Call MD for:  temperature >100.4   Complete by:  As directed    Diet - low sodium heart healthy   Complete by:  As directed    Increase activity slowly   Complete by:  As directed      Allergies as of 12/01/2017      Reactions   Penicillins Swelling   FACIAL SWELLING PATIENT HAS HAD A PCN REACTION WITH IMMEDIATE RASH, FACIAL/TONGUE/THROAT SWELLING, SOB, OR LIGHTHEADEDNESS WITH HYPOTENSION:  #  #  #  YES  #  #  #   Has patient had  a PCN reaction causing severe rash involving mucus membranes or skin necrosis: No Has patient had a PCN reaction that required hospitalization: No Has patient had a PCN reaction occurring within the last 10 years: No If all of the above answers are "NO", then may proceed with Cephalosporin u    Sulfa Antibiotics Itching, Rash      Medication List    STOP taking these medications   magic mouthwash w/lidocaine Soln     TAKE these medications   feeding supplement (ENSURE ENLIVE) Liqd Take 237 mLs by mouth 3 (three) times daily between meals.   furosemide 80 MG tablet Commonly known as:  LASIX Take 1 tablet (80 mg total) by mouth daily. Start taking on:  12/02/2017   gabapentin 100 MG capsule Commonly known as:  NEURONTIN Take 2 capsules (200 mg total) by mouth 3 (three) times daily.   lactulose 10 GM/15ML solution Commonly known as:  CHRONULAC Take 30 mLs (20 g total) by mouth 3 (three) times daily. What changed:  how much to take   levothyroxine 75 MCG tablet Commonly known as:  SYNTHROID, LEVOTHROID Take 1 tablet (75 mcg total) by mouth daily before breakfast. Start taking on:  12/02/2017 What changed:    medication strength  how much to take   oxyCODONE 5 MG immediate release tablet Commonly known as:  Oxy IR/ROXICODONE Take 1 tablet (5 mg total) by mouth every 6 (six) hours as needed for severe pain.   polyethylene glycol packet Commonly known as:  MIRALAX / GLYCOLAX Take 17 g by mouth daily.   potassium chloride SA 20 MEQ tablet Commonly known as:  K-DUR,KLOR-CON Take 1 tablet (20 mEq total) by mouth daily.   QUEtiapine 25 MG tablet Commonly known as:  SEROQUEL Take 25 mg by mouth daily.   rifaximin 550 MG Tabs tablet Commonly known as:  XIFAXAN Take 1 tablet (550 mg total) by mouth 2 (two) times daily.   senna-docusate 8.6-50 MG tablet Commonly known as:  Senokot-S Take 1 tablet by mouth 2 (two) times daily.   sertraline 100 MG tablet Commonly known as:  ZOLOFT Take 100 mg by mouth daily.   spironolactone 100 MG tablet Commonly known as:  ALDACTONE Take 2 tablets (200 mg total) by mouth daily. Start taking on:  12/02/2017   traZODone 50 MG tablet Commonly known as:  DESYREL Take 50 mg at bedtime by mouth.      Follow-up Information     PCP. Schedule an appointment as soon as possible for a visit in 1 week(s).   Why:  With repeat CBC/BMP       Gastroenterologist. Schedule an appointment as soon as possible for a visit.   Why:  At [facility] in 1 week       Altamese Tetonia, MD. Schedule an appointment as soon as possible for a visit in 1 week(s).   Specialty:  Orthopedic Surgery Contact information: [address] [place] [address] [phone]          Allergies  Allergen Reactions  . Penicillins Swelling    FACIAL SWELLING  PATIENT HAS HAD A PCN REACTION WITH IMMEDIATE RASH, FACIAL/TONGUE/THROAT SWELLING, SOB, OR LIGHTHEADEDNESS WITH HYPOTENSION:  #  #  #  YES  #  #  #   Has patient had a PCN reaction causing severe rash involving mucus membranes or skin necrosis: No Has patient had a PCN reaction that required hospitalization: No Has patient had a PCN reaction occurring  within the last 10 years: No If all of the above answers are "NO", then may proceed with Cephalosporin u  . Sulfa Antibiotics Itching and Rash    Consultations:  GI/orthopedic surgery/palliative care   Procedures/Studies: Dg Chest 2 View  Result Date: 11/25/2017 CLINICAL DATA:  Shortness of Breath EXAM: CHEST  2 VIEW COMPARISON:  11/16/2017 FINDINGS: Cardiac shadow is stable. The lungs are well aerated bilaterally. There is again identified soft tissue density over the right mid lung projecting posteriorly in the right lower lobe. Additionally a small right-sided pleural effusion is noted. The overall appearance is stable when compared with the prior examination. Improved aeration within the right lung is noted. The left lung is clear. No bony abnormality is noted. IMPRESSION: Persistent density overlying the right mid lung which has been stable over multiple previous exams and likely represents a loculated area of pleural fluid. Improved aeration in the right lung is noted when compare with the prior  exam. Noncontrast CT may be helpful to establish a baseline for this rounded density in the right mid lung. This could be performed on a nonemergent basis as it is been stable over multiple previous exams. Electronically Signed   By: Inez Catalina M.D.   On: 11/25/2017 17:58   Dg Chest 2 View  Result Date: 11/16/2017 CLINICAL DATA:  Shortness of breath. EXAM: CHEST  2 VIEW COMPARISON:  Chest x-ray dated October 29, 2017. FINDINGS: The heart size and mediastinal contours are within normal limits. Normal pulmonary vascularity. Consolidation within the superior segment of the right lower lobe, with increased patchy opacities in the right lower lobe. The left lung is clear. No pleural effusion or pneumothorax. No acute osseous abnormality. IMPRESSION: Worsening right lower lobe pneumonia. Followup PA and lateral chest X-ray is recommended in 3-4 weeks following trial of antibiotic therapy to ensure resolution and exclude underlying malignancy. Electronically Signed   By: Titus Dubin M.D.   On: 11/16/2017 15:41   Dg Abdomen 1 View  Result Date: 11/16/2017 CLINICAL DATA:  Abdominal distension with loss of appetite EXAM: ABDOMEN - 1 VIEW COMPARISON:  11/09/2017, 11/06/2017, 11/04/2017, 11/03/2017 radiographs, CT abdomen pelvis 10/29/2017 FINDINGS: Visible lung bases are clear. Decreased gaseous dilatation of large and small bowel since the prior radiograph. There is a small amount of gas in the rectum. Redemonstrated right upper quadrant calcifications consistent with gallstone and kidney stone noted on comparison CT. Partially visualized fixating hardware in the left femur IMPRESSION: Nonobstructed gas pattern with overall decreased small and large bowel gaseous enlargement since the prior radiograph Gallstones and right kidney stone Electronically Signed   By: Donavan Foil M.D.   On: 11/16/2017 17:17   Dg Abd 1 View  Result Date: 11/04/2017 CLINICAL DATA:  Abdominal distension. EXAM: ABDOMEN - 1 VIEW  COMPARISON:  11/03/2017 and prior studies from 2018. FINDINGS: Persistent diffuse gaseous distention of the bowel, most notably the colon suggesting colonic inertia. No findings for obstruction, sigmoid volvulus or free air. IMPRESSION: Persistent gaseous distention of the bowel, most notably the colon. Electronically Signed   By: Marijo Sanes M.D.   On: 11/04/2017 15:51   Ct Abdomen Pelvis W Contrast  Result Date: 11/28/2017 CLINICAL DATA:  Abdominal pain and fever. EXAM: CT ABDOMEN AND PELVIS WITH CONTRAST TECHNIQUE: Multidetector CT imaging of the abdomen and pelvis was performed using the standard protocol following bolus administration of intravenous contrast. CONTRAST:  100 mL Isovue-300 COMPARISON:  10/29/2017 FINDINGS: Lower chest: Airspace infiltrations in the right lower lung likely  representing pneumonia. Small left pleural effusion with mild basilar atelectasis. Loculated appearing fluid collection in the right pleural space may represent empyema. Bilateral breast implants. Hepatobiliary: Hepatic cirrhosis with diffuse nodular contour and enlarged lateral segment left and caudate lobes of the liver. No focal lesions identified. Cholelithiasis without evidence of cholecystitis. No bile duct dilatation. Portal veins are patent. Pancreas: Unremarkable. No pancreatic ductal dilatation or surrounding inflammatory changes. Spleen: Spleen is diffusely enlarged.  No focal lesions. Adrenals/Urinary Tract: No adrenal gland nodules. 7 x 11 mm stone in the right renal pelvis, unchanged since previous study. No obstructive changes. Nephrograms are symmetrical. Bladder is unremarkable. Stomach/Bowel: Rectosigmoid and ascending colon wall is thickened, possibly suggesting focal colitis or inflammatory bowel disease. Colon is mildly distended with stool and gas. Small bowel are not abnormally distended. Appendix is not identified Vascular/Lymphatic: Aortic atherosclerosis. No enlarged abdominal or pelvic lymph  nodes. Reproductive: Uterus and bilateral adnexa are unremarkable. Other: Diffuse abdominal and pelvic ascites. No loculated fluid collections are identified. No free air. Edema in the subcutaneous fat of the abdomen and pelvis. Diffuse upper abdominal varices. Musculoskeletal: Internal fixation of the left hip. Left superior and inferior pubic ramus fractures, new since previous study. Old bilateral sacral ala fractures. Old fractures of the left iliac bones. Old compression fractures of T12 and L1. IMPRESSION: 1. Airspace disease in the right lower lung likely representing pneumonia. 2. Loculated appearing collection in the right pleural space may indicate empyema. 3. Hepatic cirrhosis with portal hypertension including upper abdominal varices and splenic enlargement. Diffuse abdominal and pelvic ascites. 4. Cholelithiasis without evidence of cholecystitis. 5. Areas of wall thickening in the rectosigmoid and ascending colon may indicate areas of focal colitis or inflammatory bowel disease. Also consider portal hypertensive colopathy. 6. Nonobstructing stone in the right renal pelvis. 7. Aortic atherosclerosis. 8. Edema in the subcutaneous fat. New fractures of the left superior and inferior pubic ramus since previous study. 9. Old fractures of sacrum, left iliac bones, T12, and L1. Electronically Signed   By: Lucienne Capers M.D.   On: 11/28/2017 04:44   Dg Abd Portable 1v  Result Date: 11/09/2017 CLINICAL DATA:  Abdominal distension, known ileus. EXAM: PORTABLE ABDOMEN - 1 VIEW COMPARISON:  Abdominal radiographs of November 06, 2017 and November 03, 2017. FINDINGS: There remain loops of moderately distended gas-filled colon throughout the abdomen and pelvis. There is mild gaseous distention of small-bowel loops. No free extraluminal gas collections are observed. There is a stable rounded calcification measuring approximately 1 x 1.5 cm just inferior to the medial aspect of the right twelfth rib which likely  corresponds to right renal pelvis stone seen on the CT scan of October 29, 2017. There are known gallstones which are not clearly evident on today's study. There are degenerative changes of the lumbar spine. IMPRESSION: Persistent small and large bowel distention most compatible with an ileus without significant change. No evidence of perforation. Electronically Signed   By: David  Martinique M.D.   On: 11/09/2017 07:53   Dg Abd Portable 1v  Result Date: 11/06/2017 CLINICAL DATA:  Ileus. EXAM: PORTABLE ABDOMEN - 1 VIEW COMPARISON:  11/04/2017. FINDINGS: Ileus pattern. Distended small and large bowel gas appears similar to priors. IMPRESSION: Stable ileus. Electronically Signed   By: Staci Righter M.D.   On: 11/06/2017 07:13   Dg Abd Portable 1v  Result Date: 11/03/2017 CLINICAL DATA:  abd distention/pain today  Hx Ogilvie's syndrome EXAM: PORTABLE ABDOMEN - 1 VIEW COMPARISON:  11/01/2017 FINDINGS: Small bowel decompressed. Gaseous  distention of the colon, sigmoid segment measured up to 9.2 cm diameter, previously 10.6. Rectum is decompressed. Partially calcified gallstones. Right nephrolithiasis. Left femoral Fixation hardware, incompletely visualized. Thoracolumbar dextroscoliosis apex T12-L1. IMPRESSION: Continued decrease in  gaseous dilatation of the colon. Electronically Signed   By: Lucrezia Europe M.D.   On: 11/03/2017 09:22   Ir Paracentesis  Result Date: 11/26/2017 INDICATION: Patient with recurrent ascites. Request is made for therapeutic paracentesis. EXAM: ULTRASOUND GUIDED THERAPEUTIC PARACENTESIS MEDICATIONS: 5 mL 2% lidocaine COMPLICATIONS: None immediate. PROCEDURE: Informed written consent was obtained from the patient after a discussion of the risks, benefits and alternatives to treatment. A timeout was performed prior to the initiation of the procedure. Initial ultrasound scanning demonstrates a small amount of ascites within the right lower abdominal quadrant. The right lower abdomen was  prepped and draped in the usual sterile fashion. 2% lidocaine with epinephrine was used for local anesthesia. Following this, a 19 gauge, 7-cm, Yueh catheter was introduced. An ultrasound image was saved for documentation purposes. The paracentesis was performed. The catheter was removed and a dressing was applied. The patient tolerated the procedure well without immediate post procedural complication. FINDINGS: A total of approximately 2.2 L of clear, yellow fluid was removed. IMPRESSION: Successful ultrasound-guided therapeutic paracentesis yielding 2.2 liters of peritoneal fluid. Read by: Brynda Greathouse PA-C Electronically Signed   By: Lucrezia Europe M.D.   On: 11/26/2017 16:52   Ir Paracentesis  Result Date: 11/18/2017 INDICATION: History of cirrhosis. Abdominal distention secondary to recurrent ascites. Request for therapeutic paracentesis. EXAM: ULTRASOUND GUIDED LEFT LOWER QUADRANT PARACENTESIS MEDICATIONS: None. COMPLICATIONS: None immediate. PROCEDURE: Informed written consent was obtained from the patient after a discussion of the risks, benefits and alternatives to treatment. A timeout was performed prior to the initiation of the procedure. Initial ultrasound scanning demonstrates a large amount of ascites within the left lower abdominal quadrant. The left lower abdomen was prepped and draped in the usual sterile fashion. 1% lidocaine with epinephrine was used for local anesthesia. Following this, a 19 gauge, 7-cm, Yueh catheter was introduced. An ultrasound image was saved for documentation purposes. The paracentesis was performed. The catheter was removed and a dressing was applied. The patient tolerated the procedure well without immediate post procedural complication. FINDINGS: A total of approximately 5.1 L of clear yellow fluid was removed. IMPRESSION: Successful ultrasound-guided paracentesis yielding 5.1 liters of peritoneal fluid. Read by: Ascencion Dike PA-C Electronically Signed   By: Aletta Edouard M.D.   On: 11/18/2017 15:25    Subjective: Patient seen and examined at bedside.  She denies any overnight fever, nausea or vomiting.  Discharge Exam: Vitals:   11/30/17 2126 12/01/17 0454  BP: (!) 99/58 (!) 96/58  Pulse: 76 64  Resp: 16 16  Temp: 98.9 F (37.2 C) 98 F (36.7 C)  SpO2: 95% 97%   Vitals:   11/30/17 1343 11/30/17 2126 12/01/17 0454 12/01/17 0457  BP: (!) 96/53 (!) 99/58 (!) 96/58   Pulse: 65 76 64   Resp: 20 16 16    Temp: 99.5 F (37.5 C) 98.9 F (37.2 C) 98 F (36.7 C)   TempSrc: Oral  Oral   SpO2: 93% 95% 97%   Weight:    60.4 kg (133 lb 2.5 oz)  Height:        General: Pt is alert, awake, not in acute distress Cardiovascular: Rate controlled, S1/S2 + Respiratory: Bilateral decreased breath sounds at bases  abdominal: Soft, slightly distended, NT, ND, bowel sounds +, left lower  quadrant erythema has almost resolved Extremities: no edema, no cyanosis    The results of significant diagnostics from this hospitalization (including imaging, microbiology, ancillary and laboratory) are listed below for reference.     Microbiology: Recent Results (from the past 240 hour(s))  Culture, blood (routine x 2)     Status: None   Collection Time: 11/25/17  9:04 PM  Result Value Ref Range Status   Specimen Description BLOOD RIGHT ANTECUBITAL  Final   Special Requests   Final    BOTTLES DRAWN AEROBIC AND ANAEROBIC Blood Culture adequate volume   Culture NO GROWTH 5 DAYS  Final   Report Status 11/30/2017 FINAL  Final  Culture, blood (routine x 2)     Status: None   Collection Time: 11/25/17  9:05 PM  Result Value Ref Range Status   Specimen Description BLOOD RIGHT ARM  Final   Special Requests   Final    BOTTLES DRAWN AEROBIC AND ANAEROBIC Blood Culture adequate volume   Culture NO GROWTH 5 DAYS  Final   Report Status 11/30/2017 FINAL  Final     Labs: BNP (last 3 results) No results for input(s): BNP in the last 8760 hours. Basic Metabolic  Panel: Recent Labs  Lab 11/26/17 0717 11/27/17 0903 11/28/17 0842 11/29/17 0209 12/01/17 1210  NA 140 138 137 136 133*  K 3.2* 3.2* 3.2* 3.8 4.1  CL 111 104 101 100* 96*  CO2 19* 24 27 25 26   GLUCOSE 121* 85 90 85 101*  BUN 15 8 6 8 9   CREATININE 0.65 0.63 0.62 0.59 0.77  CALCIUM 7.9* 8.1* 8.1* 8.1* 8.3*   Liver Function Tests: Recent Labs  Lab 11/25/17 1539 11/26/17 0717 11/28/17 0842 11/29/17 0209  AST 48* 40 41 40  ALT 15 14 14 14   ALKPHOS 180* 155* 158* 162*  BILITOT 2.5* 2.5* 2.1* 1.7*  PROT 5.8* 4.9* 5.3* 5.1*  ALBUMIN 2.5* 2.4* 2.6* 2.4*   No results for input(s): LIPASE, AMYLASE in the last 168 hours. Recent Labs  Lab 11/25/17 2334 11/27/17 0903 11/29/17 0209 11/30/17 0900  AMMONIA 54* 52* 75* 63*   CBC: Recent Labs  Lab 11/25/17 1539 11/25/17 2049 11/26/17 0415 11/28/17 0842 11/29/17 0209 12/01/17 1210  WBC 5.8  --  4.7 4.3 4.7 6.0  NEUTROABS  --  3.5  --   --   --  4.0  HGB 12.4  --  11.2* 10.9* 10.1* 11.0*  HCT 40.0  --  35.7* 34.0* 31.9* 34.8*  MCV 95.9  --  96.5 93.2 93.3 93.5  PLT 45*  --  44* 42* 46* 50*   Cardiac Enzymes: No results for input(s): CKTOTAL, CKMB, CKMBINDEX, TROPONINI in the last 168 hours. BNP: Invalid input(s): POCBNP CBG: No results for input(s): GLUCAP in the last 168 hours. D-Dimer No results for input(s): DDIMER in the last 72 hours. Hgb A1c No results for input(s): HGBA1C in the last 72 hours. Lipid Profile No results for input(s): CHOL, HDL, LDLCALC, TRIG, CHOLHDL, LDLDIRECT in the last 72 hours. Thyroid function studies No results for input(s): TSH, T4TOTAL, T3FREE, THYROIDAB in the last 72 hours.  Invalid input(s): FREET3 Anemia work up No results for input(s): VITAMINB12, FOLATE, FERRITIN, TIBC, IRON, RETICCTPCT in the last 72 hours. Urinalysis    Component Value Date/Time   COLORURINE AMBER (A) 11/16/2017 1740   APPEARANCEUR HAZY (A) 11/16/2017 1740   LABSPEC 1.033 (H) 11/16/2017 1740   PHURINE  6.0 11/16/2017 1740   GLUCOSEU NEGATIVE 11/16/2017 1740  HGBUR LARGE (A) 11/16/2017 1740   BILIRUBINUR SMALL (A) 11/16/2017 1740   KETONESUR 5 (A) 11/16/2017 1740   PROTEINUR 100 (A) 11/16/2017 1740   NITRITE NEGATIVE 11/16/2017 1740   LEUKOCYTESUR NEGATIVE 11/16/2017 1740   Sepsis Labs Invalid input(s): PROCALCITONIN,  WBC,  LACTICIDVEN Microbiology Recent Results (from the past 240 hour(s))  Culture, blood (routine x 2)     Status: None   Collection Time: 11/25/17  9:04 PM  Result Value Ref Range Status   Specimen Description BLOOD RIGHT ANTECUBITAL  Final   Special Requests   Final    BOTTLES DRAWN AEROBIC AND ANAEROBIC Blood Culture adequate volume   Culture NO GROWTH 5 DAYS  Final   Report Status 11/30/2017 FINAL  Final  Culture, blood (routine x 2)     Status: None   Collection Time: 11/25/17  9:05 PM  Result Value Ref Range Status   Specimen Description BLOOD RIGHT ARM  Final   Special Requests   Final    BOTTLES DRAWN AEROBIC AND ANAEROBIC Blood Culture adequate volume   Culture NO GROWTH 5 DAYS  Final   Report Status 11/30/2017 FINAL  Final     Time coordinating discharge: 35 minutes  SIGNED:   Aline August, MD  [facility] 12/01/2017, 1:52 PM Pager: [phone]  If 7PM-7AM, please contact night-coverage [email or web address] Password [email or web address]

## 2017-12-01 NOTE — Progress Notes (Signed)
UNASSIGNED PATIENT Subjective: Since I last evaluated the patient, she seems to be slightly more comfortable today. However she continues to complain of pain in her groin from the pelvic fractures and feels the pain medications are helping her much. She denies having any nausea vomiting but has some periumbilical abdominal pain. Her husband is at her bedside.  Objective: Vital signs in last 24 hours: Temp:  [98 F (36.7 C)-98.9 F (37.2 C)] 98 F (36.7 C) (01/30 0454) Pulse Rate:  [64-76] 64 (01/30 0454) Resp:  [16] 16 (01/30 0454) BP: (96-99)/(58) 96/58 (01/30 0454) SpO2:  [95 %-97 %] 97 % (01/30 0454) Weight:  [60.4 kg (133 lb 2.5 oz)] 60.4 kg (133 lb 2.5 oz) (01/30 0457) Last BM Date: 11/30/17  Intake/Output from previous day: 01/29 0701 - 01/30 0700 In: 342 [P.O.:342] Out: 1150 [Urine:1150] Intake/Output this shift: Total I/O In: 370 [P.O.:370] Out: -   General appearance: cooperative, appears stated age, distracted, fatigued, icteric, moderate distress and slowed mentation Resp: clear to auscultation bilaterally Cardio: regular rate and rhythm, S1, S2 normal, no murmur, click, rub or gallop GI: soft, non-tender; distended with hypoactive bowel sounds; no masses palpable Extremities: extremities normal, atraumatic, no cyanosis or edema  Lab Results: Recent Labs    11/29/17 0209 12/01/17 1210  WBC 4.7 6.0  HGB 10.1* 11.0*  HCT 31.9* 34.8*  PLT 46* 50*   BMET Recent Labs    11/29/17 0209 12/01/17 1210  NA 136 133*  K 3.8 4.1  CL 100* 96*  CO2 25 26  GLUCOSE 85 101*  BUN 8 9  CREATININE 0.59 0.77  CALCIUM 8.1* 8.3*   LFT Recent Labs    11/29/17 0209  PROT 5.1*  ALBUMIN 2.4*  AST 40  ALT 14  ALKPHOS 162*  BILITOT 1.7*   Studies/Results: No results found.  Medications: I have reviewed the patient's current medications.  Assessment/Plan: 1) Alcoholic/Nash cirrhosis with ascites, portal hypertension, splenomegaly, umbilical hernia,  thrombocytopenia and ascites. She seems to be diuresing well;she has been foot on step to diuretics along with a low sodium diet.  2) Pelvic fracture with joint pain-upportive care as per the orthopedic service.   LOS: 6 days   Jarnell Cordaro 12/01/2017, 1:45 PM

## 2017-12-01 NOTE — Progress Notes (Signed)
Pt given education and AVS reviewed with pt. Iv site was removed. Prescriptions were given to pt. Called provider, Alekh to inform of pt requesting more pain medications and a prescription for gabapentin. Explained to pt and husband that provider wants her to follow-up with her primary provider for increases in her medications.

## 2017-12-01 NOTE — Progress Notes (Signed)
Physical Therapy Treatment Patient Details Name: Megan Roberson MRN: [id number] DOB: August 24, 1956 Today's Date: 12/01/2017    History of Present Illness Pt is a 62 y.o. female with medical history significant of liver cirrhosis, thrombocytopenia, hypothyroidism, depression, anxiety, hepatic encephalopathy, Ogilvie syndrome, and recent fall with lumbar transverse process fractures and a S2 fracture.  She presented with pain in left groin area and left lower abdominal wall and SOB. She had a paracentesis done on 1/17.     PT Comments    Patient received in bed, pleasant and motivated to participate with PT this morning. Of note, she is able to perform bed mobility with less assistance this session, but continues to require ModA for initial sit to stand transfer however only MinA for stand pivot today. Able to use bedside commode and clean self with general S from PT. She was then able to ambulate approximately 72f with RW and min guard from bedside commode to the chair however distance limited by fatigue and pain. She was left up in the chair with all needs met, chair alarm activated this afternoon.    Follow Up Recommendations  Home health PT;Supervision/Assistance - 24 hour     Equipment Recommendations  None recommended by PT    Recommendations for Other Services       Precautions / Restrictions Precautions Precautions: Fall Restrictions Weight Bearing Restrictions: No Other Position/Activity Restrictions: On last admission, pt issued TLSO due to spinal fractures. Pt reports she has not worn brace since discharge. She states now it will not fit over her ascites.     Mobility  Bed Mobility Overal bed mobility: Needs Assistance Bed Mobility: Rolling;Sidelying to Sit Rolling: Min guard Sidelying to sit: Min assist       General bed mobility comments: with railing, able to perform all bed mobility with much less effort today  Transfers Overall transfer level: Needs  assistance Equipment used: Rolling walker (2 wheeled) Transfers: Sit to/from [place]are[place] to Stand: Mod assist Stand pivot transfers: Min assist       General transfer comment: continues to require [place] to stand, however able to perform stand pivot with improved ease and safety today, good control on stand to sit transfers   Ambulation/Gait Ambulation/Gait assistance: Min guard Ambulation Distance (Feet): 3 Feet Assistive device: Rolling walker (2 wheeled) Gait Pattern/deviations: Decreased step length - right;Decreased step length - left;Decreased stride length;Step-through pattern;Trunk flexed;Antalgic     General Gait Details: limited by fatigue and pain, Mod verbal encouragement    Stairs            Wheelchair Mobility    Modified Rankin (Stroke Patients Only)       Balance Overall balance assessment: Needs assistance;History of Falls Sitting-balance support: Feet supported;Bilateral upper extremity supported Sitting balance-Leahy Scale: Good     Standing balance support: During functional activity;Bilateral upper extremity supported Standing balance-Leahy Scale: Fair Standing balance comment: reliant on UE support                             Cognition Arousal/Alertness: Awake/alert Behavior During Therapy: WFL for tasks assessed/performed Overall Cognitive Status: Within Functional Limits for tasks assessed                                 General Comments: Motivated to participater in therapy and return to independence  Exercises      General Comments        Pertinent Vitals/Pain Pain Assessment: 0-10 Pain Score: 7  Pain Location: L groin  Pain Descriptors / Indicators: Sore;Grimacing;Guarding;Moaning Pain Intervention(s): Limited activity within patient's tolerance;Monitored during session;Repositioned    Home Living                      Prior Function             PT Goals (current goals can now be found in the care plan section) Acute Rehab PT Goals Patient Stated Goal: decrease pain PT Goal Formulation: With patient Time For Goal Achievement: 12/10/17 Potential to Achieve Goals: Good Progress towards PT goals: Progressing toward goals    Frequency    Min 3X/week      PT Plan Current plan remains appropriate    Co-evaluation              AM-PAC PT "6 Clicks" Daily Activity  Outcome Measure  Difficulty turning over in bed (including adjusting bedclothes, sheets and blankets)?: Unable Difficulty moving from lying on back to sitting on the side of the bed? : Unable Difficulty sitting down on and standing up from a chair with arms (e.g., wheelchair, bedside commode, etc,.)?: Unable Help needed moving to and from a bed to chair (including a wheelchair)?: A Little Help needed walking in hospital room?: A Little Help needed climbing 3-5 steps with a railing? : A Lot 6 Click Score: 11    End of Session Equipment Utilized During Treatment: Gait belt Activity Tolerance: Patient limited by pain;Patient limited by fatigue Patient left: in chair;with chair alarm set;with call bell/phone within reach Nurse Communication: Other (comment)(CNA education on transfers/performance with mobility, requested that patient use bedside commode more often ) PT Visit Diagnosis: Other abnormalities of gait and mobility (R26.89)     Time: 7793-9030 PT Time Calculation (min) (ACUTE ONLY): 30 min  Charges:  $Therapeutic Activity: 23-37 mins                    G Codes:       Deniece Ree PT, DPT, CBIS  Supplemental Physical Therapist [place]   Pager [phone]

## 2017-12-08 DIAGNOSIS — K7460 Unspecified cirrhosis of liver: Secondary | ICD-10-CM | POA: Diagnosis not present

## 2017-12-08 DIAGNOSIS — G4700 Insomnia, unspecified: Secondary | ICD-10-CM | POA: Diagnosis not present

## 2017-12-08 DIAGNOSIS — G8929 Other chronic pain: Secondary | ICD-10-CM | POA: Diagnosis not present

## 2017-12-08 DIAGNOSIS — K5900 Constipation, unspecified: Secondary | ICD-10-CM | POA: Diagnosis not present

## 2017-12-08 DIAGNOSIS — G9009 Other idiopathic peripheral autonomic neuropathy: Secondary | ICD-10-CM | POA: Diagnosis not present

## 2017-12-08 DIAGNOSIS — R110 Nausea: Secondary | ICD-10-CM | POA: Diagnosis not present

## 2017-12-08 DIAGNOSIS — E039 Hypothyroidism, unspecified: Secondary | ICD-10-CM | POA: Diagnosis not present

## 2017-12-28 DIAGNOSIS — M25572 Pain in left ankle and joints of left foot: Secondary | ICD-10-CM | POA: Diagnosis not present

## 2017-12-28 DIAGNOSIS — M7989 Other specified soft tissue disorders: Secondary | ICD-10-CM | POA: Diagnosis not present

## 2018-01-04 ENCOUNTER — Ambulatory Visit (INDEPENDENT_AMBULATORY_CARE_PROVIDER_SITE_OTHER): Payer: Medicare HMO | Admitting: Gastroenterology

## 2018-01-04 ENCOUNTER — Ambulatory Visit (INDEPENDENT_AMBULATORY_CARE_PROVIDER_SITE_OTHER)
Admission: RE | Admit: 2018-01-04 | Discharge: 2018-01-04 | Disposition: A | Discharge status: Home / Self Care | Payer: Medicare HMO | Source: Ambulatory Visit | Attending: Gastroenterology | Admitting: Gastroenterology

## 2018-01-04 ENCOUNTER — Encounter: Payer: Self-pay | Admitting: Gastroenterology

## 2018-01-04 ENCOUNTER — Encounter (INDEPENDENT_AMBULATORY_CARE_PROVIDER_SITE_OTHER): Payer: Self-pay

## 2018-01-04 ENCOUNTER — Other Ambulatory Visit (INDEPENDENT_AMBULATORY_CARE_PROVIDER_SITE_OTHER): Payer: Medicare HMO

## 2018-01-04 VITALS — BP 120/74 | HR 76

## 2018-01-04 DIAGNOSIS — R188 Other ascites: Secondary | ICD-10-CM

## 2018-01-04 DIAGNOSIS — K7290 Hepatic failure, unspecified without coma: Secondary | ICD-10-CM | POA: Diagnosis not present

## 2018-01-04 DIAGNOSIS — K7460 Unspecified cirrhosis of liver: Secondary | ICD-10-CM

## 2018-01-04 DIAGNOSIS — K7682 Hepatic encephalopathy: Secondary | ICD-10-CM

## 2018-01-04 DIAGNOSIS — I8500 Esophageal varices without bleeding: Secondary | ICD-10-CM

## 2018-01-04 DIAGNOSIS — J90 Pleural effusion, not elsewhere classified: Secondary | ICD-10-CM

## 2018-01-04 DIAGNOSIS — J189 Pneumonia, unspecified organism: Secondary | ICD-10-CM | POA: Diagnosis not present

## 2018-01-04 LAB — CBC WITH DIFFERENTIAL/PLATELET
Basophils Absolute: 0 10*3/uL (ref 0.0–0.1)
Basophils Relative: 0.4 % (ref 0.0–3.0)
Eosinophils Absolute: 0.3 10*3/uL (ref 0.0–0.7)
Eosinophils Relative: 7.3 % — ABNORMAL HIGH (ref 0.0–5.0)
HCT: 32.8 % — ABNORMAL LOW (ref 36.0–46.0)
Hemoglobin: 11 g/dL — ABNORMAL LOW (ref 12.0–15.0)
Lymphocytes Relative: 14.6 % (ref 12.0–46.0)
Lymphs Abs: 0.6 10*3/uL — ABNORMAL LOW (ref 0.7–4.0)
MCHC: 33.7 g/dL (ref 30.0–36.0)
MCV: 91.1 fl (ref 78.0–100.0)
Monocytes Absolute: 0.7 10*3/uL (ref 0.1–1.0)
Monocytes Relative: 16 % — ABNORMAL HIGH (ref 3.0–12.0)
Neutro Abs: 2.6 10*3/uL (ref 1.4–7.7)
Neutrophils Relative %: 61.7 % (ref 43.0–77.0)
Platelets: 48 10*3/uL — CL (ref 150.0–400.0)
RBC: 3.6 Mil/uL — ABNORMAL LOW (ref 3.87–5.11)
RDW: 17.7 % — ABNORMAL HIGH (ref 11.5–15.5)
WBC: 4.2 10*3/uL (ref 4.0–10.5)

## 2018-01-04 LAB — COMPREHENSIVE METABOLIC PANEL
ALT: 18 U/L (ref 0–35)
AST: 45 U/L — ABNORMAL HIGH (ref 0–37)
Albumin: 2.9 g/dL — ABNORMAL LOW (ref 3.5–5.2)
Alkaline Phosphatase: 183 U/L — ABNORMAL HIGH (ref 39–117)
BUN: 10 mg/dL (ref 6–23)
CO2: 23 mEq/L (ref 19–32)
Calcium: 8.8 mg/dL (ref 8.4–10.5)
Chloride: 109 mEq/L (ref 96–112)
Creatinine, Ser: 0.57 mg/dL (ref 0.40–1.20)
GFR: 115.27 mL/min (ref >60.00)
Glucose, Bld: 88 mg/dL (ref 70–99)
Potassium: 3.9 mEq/L (ref 3.5–5.1)
Sodium: 138 mEq/L (ref 135–145)
Total Bilirubin: 2.5 mg/dL — ABNORMAL HIGH (ref 0.2–1.2)
Total Protein: 6.2 g/dL (ref 6.0–8.3)

## 2018-01-04 NOTE — Progress Notes (Signed)
HPI :  62 year old female with a history of suspected alcohol related liver disease or NASH causing cirrhosis, here for a follow-up visit after 2 hospitalizations for cirrhosis related complications.  This is my first time seeing her.  She was initially admitted December 2018 for worsening ascites and encephalopathy.  At that time she was not compliant with her medications, at times due to fear of falling.  After that visit she was [facility] and had [facility]acentesis in January.  She represented to [facility] with a cellulitis of the abdominal wall following paracentesis.  At that time she had additional problems with ascites.  She was discharged after that hospitalization on Lasix 80 mg/day and Aldactone 200 mg/day.  She is not followed up until now.  She states she tripped and twisted her ankle a few weeks ago.  She states x-rays were done and it was negative however she continues to have pain in her left ankle.  In this light she stopped taking her Lasix about a week ago in order to reduce the amount she has to urinate.  She has continued to take Aldactone as well as a potassium supplement.  She states since she stopped her Lasix her ascites is reaccumulating.  She is not tense or short of breath, however she does endorse getting worse.  She denies any lower extremity edema.  For her ankle pain she has been taking 6-8 tablets of ibuprofen per day.  She denies any narcotic use.  Her husband endorses she has been compliant with lactulose and rifaximin, her mental status appears appropriate.  She is having a few loose stools per day on this regimen.  She denies any blood in her stools.  She states she has not been drinking any alcohol for the past 10 years.  During her admission in January she had a CT scan done showing suspected and pneumonia along with a loculated pleural effusion the right lung.  She had evidence of ascites with abdominal varices.  She also had multiple areas  of wall thickening in her colon.  She and her husband report they moved from [place] this past summer.  She was listed for transplant when living there.  She states she has a history of bleeding esophageal varices which have required banding in the past.  She has not had an EGD in at least 5 years from her report.  She thinks her colonoscopy was last done around 5 years as well.  She asks about her candidacy for liver transplant again.  She is not following a low-salt diet, husband states she eats what she wants. Overall they do think she is improved from her hospitalization in January.  CT Scan 11/28/2017 - cirrhosis, suspected varices, diffuse ascites, gallstones, inflamed colon, pneumonia with suspected loculated effusion   Past Medical History:  Diagnosis Date  . Anxiety   . Cirrhosis of liver ([place])   . Depression   . Heart murmur    "not concerned about "   . History of abdominal paracentesis   . History of blood transfusion   . Hypothyroidism   . Ileus ([place])   . Liver disease 2008  . Neuropathy   . Pancytopenia ([place])   . Pneumonia      Past Surgical History:  Procedure Laterality Date  . COLONOSCOPY WITH ESOPHAGOGASTRODUODENOSCOPY (EGD) AND ESOPHAGEAL DILATION (ED)    . ESOPHAGOGASTRODUODENOSCOPY W/ BANDING     Varices  . FRACTURE SURGERY Left    x  2  . HARDWARE REMOVAL Left 09/17/2017   Procedure: HARDWARE REMOVAL LEFT OLECRANON;  Surgeon: Altamese Summerlin South, MD;  Location: [place];  Service: Orthopedics;  Laterality: Left;  . HIP SURGERY Left    from fracture- rod in palce  . IR PARACENTESIS  11/18/2017  . IR PARACENTESIS  11/26/2017  . KNEE SURGERY Bilateral    open incision for ligaments  . ORIF ELBOW FRACTURE Left 04/30/2017   Procedure: REVISION OPEN REDUCTION INTERNAL FIXATION (ORIF) ELBOW/OLECRANON FRACTURE;  Surgeon: Altamese [place], MD;  Location: [facility];  Service: Orthopedics;  Laterality: Left;   Family History  Adopted: Yes   Social History   Tobacco Use  .  Smoking status: Never Smoker  . Smokeless tobacco: Never Used  Substance Use Topics  . Alcohol use: No  . Drug use: No   Current Outpatient Medications  Medication Sig Dispense Refill  . furosemide (LASIX) 80 MG tablet Take 1 tablet (80 mg total) by mouth daily. 30 tablet 0  . gabapentin (NEURONTIN) 100 MG capsule Take 2 capsules (200 mg total) by mouth 3 (three) times daily. 90 capsule 0  . ibuprofen (ADVIL,MOTRIN) 200 MG tablet Take 200 mg by mouth every 6 (six) hours as needed.    . lactulose (CHRONULAC) 10 GM/15ML solution Take 30 mLs (20 g total) by mouth 3 (three) times daily. 946 mL 0  . levothyroxine (SYNTHROID, LEVOTHROID) 75 MCG tablet Take 1 tablet (75 mcg total) by mouth daily before breakfast. 30 tablet 0  . ondansetron (ZOFRAN-ODT) 4 MG disintegrating tablet Take 4 mg by mouth every 8 (eight) hours as needed for nausea or vomiting.    Marland Kitchen oxyCODONE (OXY IR/ROXICODONE) 5 MG immediate release tablet Take 1 tablet (5 mg total) by mouth every 6 (six) hours as needed for severe pain. 14 tablet 0  . polyethylene glycol (MIRALAX / GLYCOLAX) packet Take 17 g by mouth daily.    . potassium chloride SA (K-DUR,KLOR-CON) 20 MEQ tablet Take 1 tablet (20 mEq total) by mouth daily. 10 tablet 0  . Protein POWD 1 Dose by Does not apply route daily.    . QUEtiapine (SEROQUEL) 25 MG tablet Take 25 mg by mouth daily.     . rifaximin (XIFAXAN) 550 MG TABS tablet Take 1 tablet (550 mg total) by mouth 2 (two) times daily. 60 tablet 0  . sertraline (ZOLOFT) 100 MG tablet Take 100 mg by mouth daily.     Marland Kitchen spironolactone (ALDACTONE) 100 MG tablet Take 2 tablets (200 mg total) by mouth daily. 30 tablet 0  . traZODone (DESYREL) 50 MG tablet Take 50 mg at bedtime by mouth.     No current facility-administered medications for this visit.    Allergies  Allergen Reactions  . Penicillins Swelling    FACIAL SWELLING  PATIENT HAS HAD A PCN REACTION WITH IMMEDIATE RASH, FACIAL/TONGUE/THROAT SWELLING, SOB, OR  LIGHTHEADEDNESS WITH HYPOTENSION:  #  #  #  YES  #  #  #   Has patient had a PCN reaction causing severe rash involving mucus membranes or skin necrosis: No Has patient had a PCN reaction that required hospitalization: No Has patient had a PCN reaction occurring within the last 10 years: No If all of the above answers are "NO", then may proceed with Cephalosporin u  . Sulfa Antibiotics Itching and Rash     Review of Systems: All systems reviewed and negative except where noted in HPI.     Physical Exam: BP 120/74 (BP Location: Left Arm,  Patient Position: Sitting, Cuff Size: Normal)   Pulse 76  Constitutional: Pleasant, female in no acute distress, sitting in wheelchair HEENT: Normocephalic and atraumatic.  Neck supple.  Cardiovascular: Normal rate, regular rhythm.  Pulmonary/chest: Effort normal and breath sounds normal.  Abdominal: Soft, moderately distended but soft, nontender.  (+) hepatomegaly. Extremities: no edema, Left ankle bruised and wrapped in ACE bandage Lymphadenopathy: No cervical adenopathy noted. Neurological: Alert and oriented to person place and time. No asterixis Skin: Skin is warm and dry. No rashes noted. Psychiatric: Normal mood and affect. Behavior is normal.   ASSESSMENT AND PLAN: 62 year old female with decompensated cirrhosis related to suspected alcohol versus fatty liver.  Complicated history as outlined above, history of ascites, encephalopathy, and bleeding esophageal varices.  She is also had a recent pneumonia with a loculated pleural effusion (has had thoracentesis reportedly in the past).  Her MELD score today is 14.  I had a lengthy discussion with patient and her husband today about her recent course, cirrhosis and risk for further decompensation.  I am recommending the following as outlined below:  - will repeat labs today - CBC, CMET. Also obtain AFP level, and screening for immunity to hep A and hep B and vaccinate if needed - she needs to  take her medications as directed and not stop anything without discussing with [place] first given risk for electrolyte imbalances / renal failure on diuretics - will add back lasix pending labs look okay given worsening ascites. Do not think she warrants paracentesis at this time but may need it if not responding to diuretics. Husband will help her ambulate to the bathroom - she needs to completely STOP ALL NSAIDs. Can use low dose tylenol < 2 gm / day as replacement. Also recommend NO NARCOTICS given history of encephalopathy - low Na diet - counseled on importance of being compliant with this - given history of bleeding esophageal varices, she warrants repeat EGD with banding if present (they are noted on CT). She has chronic thrombocytopenia and elevated INR, may need Doptelet prior to this procedure. Will need labs back today and CXR to be done prior to scheduling. She is not a good candidate for beta blocker given worsening ascites right now on moderate dose of diuretics - continue lactulose to goal 3 loose BMs / day. Continue rifaximin - CXR PA / LAT to reassess pleural effusion, may need referral to pulmonary for this if loculated / thoracentesis - referral to Dr. Zollie Scale for liver transplant evaluation - patient will follow up with PCM this week regarding her ankle, may need orthopedics evaluation  She will be established with either myself or Dr. Loletha Carrow who met her initially during hospitalization. I will discuss this case with him.   Clyde Cellar, MD [facility] Pager [phone]

## 2018-01-04 NOTE — Patient Instructions (Addendum)
If you are age 60 or older, your body mass index should be between 23-30. Your There is no height or weight on file to calculate BMI. If this is out of the aforementioned range listed, please consider follow up with your Primary Care Provider.  If you are age 39 or younger, your body mass index should be between 19-25. Your There is no height or weight on file to calculate BMI. If this is out of the aformentioned range listed, please consider follow up with your Primary Care Provider.   Please go to the lab in the basement of our building to have lab work done as you leave today.  Please follow a Low Salt diet.  Please discontinue using ibuprofen and al NSAIDS.  You can use Tylenol for pain: Take 250-571m  Every 6 hours, as needed.  Do not exceed 2,0044min one 24 hour period.  We are referring you to Hepatology. [facility].  You will be contacted to schedule an appointment.  Please let usKoreanow if you have not heard from them within 2 weeks.   Thank you for entrusting me with your care and [facility] Cellar

## 2018-01-05 ENCOUNTER — Other Ambulatory Visit: Payer: Self-pay

## 2018-01-05 ENCOUNTER — Telehealth: Payer: Self-pay | Admitting: Gastroenterology

## 2018-01-05 DIAGNOSIS — K7460 Unspecified cirrhosis of liver: Secondary | ICD-10-CM

## 2018-01-05 DIAGNOSIS — R188 Other ascites: Secondary | ICD-10-CM

## 2018-01-05 LAB — AFP TUMOR MARKER: AFP-Tumor Marker: 2.9 ng/mL

## 2018-01-05 LAB — HEPATITIS A ANTIBODY, TOTAL: Hepatitis A AB,Total: REACTIVE — AB

## 2018-01-05 LAB — HEPATITIS B SURFACE ANTIBODY,QUALITATIVE: Hep B S Ab: REACTIVE — AB

## 2018-01-05 MED ORDER — FUROSEMIDE 40 MG PO TABS: 40.0000 mg | ORAL_TABLET | Freq: Two times a day (BID) | ORAL | 3 refills | Status: DC

## 2018-01-05 NOTE — Telephone Encounter (Signed)
Patient states she is returning a call from [facility] about lab results from yesterday 3.5.19.

## 2018-01-05 NOTE — Telephone Encounter (Signed)
See result note.  

## 2018-01-06 ENCOUNTER — Emergency Department (HOSPITAL_COMMUNITY)
Admission: EM | Admit: 2018-01-06 | Discharge: 2018-01-06 | Disposition: A | Discharge status: Home / Self Care | Payer: Medicare HMO | Attending: Emergency Medicine | Admitting: Emergency Medicine

## 2018-01-06 ENCOUNTER — Other Ambulatory Visit: Payer: Self-pay

## 2018-01-06 ENCOUNTER — Emergency Department (HOSPITAL_COMMUNITY): Payer: Medicare HMO

## 2018-01-06 ENCOUNTER — Encounter (HOSPITAL_COMMUNITY): Payer: Self-pay | Admitting: Emergency Medicine

## 2018-01-06 DIAGNOSIS — G8929 Other chronic pain: Secondary | ICD-10-CM | POA: Diagnosis not present

## 2018-01-06 DIAGNOSIS — M21072 Valgus deformity, not elsewhere classified, left ankle: Secondary | ICD-10-CM | POA: Insufficient documentation

## 2018-01-06 DIAGNOSIS — M2042 Other hammer toe(s) (acquired), left foot: Secondary | ICD-10-CM | POA: Diagnosis not present

## 2018-01-06 DIAGNOSIS — M25572 Pain in left ankle and joints of left foot: Secondary | ICD-10-CM | POA: Diagnosis not present

## 2018-01-06 DIAGNOSIS — E039 Hypothyroidism, unspecified: Secondary | ICD-10-CM | POA: Insufficient documentation

## 2018-01-06 DIAGNOSIS — Z79899 Other long term (current) drug therapy: Secondary | ICD-10-CM | POA: Diagnosis not present

## 2018-01-06 DIAGNOSIS — M25472 Effusion, left ankle: Secondary | ICD-10-CM | POA: Diagnosis not present

## 2018-01-06 DIAGNOSIS — M7989 Other specified soft tissue disorders: Secondary | ICD-10-CM | POA: Diagnosis not present

## 2018-01-06 MED ORDER — HYDROCODONE-ACETAMINOPHEN 5-325 MG PO TABS
1.0000 | ORAL_TABLET | Freq: Once | ORAL | Status: AC
  Administered 2018-01-06: 1 via ORAL
  Filled 2018-01-06: qty 1

## 2018-01-06 MED ORDER — HYDROCODONE-ACETAMINOPHEN 5-325 MG PO TABS: ORAL_TABLET | ORAL | 0 refills | Status: DC

## 2018-01-06 NOTE — ED Triage Notes (Signed)
Patient c/o left ankle pain since fall in December. Per patient pain progressively getting worse. Patient states seen at urgent Care last week and had x-ray with no fractures shown, ? Gout. Patient given antiinflammatory with no relief.  Patient has extensive hx of fractures. Patient using walker and has ace bandage to ankle.

## 2018-01-06 NOTE — Discharge Instructions (Signed)
Try wearing the ankle brace as needed for support.  Call Dr. Ruthe Mannan office to arrange a follow-up appointment

## 2018-01-08 NOTE — ED Provider Notes (Signed)
[facility] EMERGENCY DEPARTMENT Provider Note   CSN: [id number] Arrival date & time: 01/06/18  1702     History   Chief Complaint Chief Complaint  Patient presents with  . Ankle Pain    HPI Megan Roberson is a 62 y.o. female.  HPI   Megan Roberson is a 62 y.o. female who presents to the Emergency Department complaining of pain and swelling of her left ankle since December.  She states that she suffered from a fall that resulted in a pelvic fracture in December and has gradually noticed her "ankle turning inward" since her injury.  She states that she tries to walk on the outside of her foot but is getting too painful.  She states that she has been seen in several emergency rooms and urgent cares with x-rays performed, but nothing is helped her symptoms.  She states that she has been using a walker and an Ace wrap to her ankle.  She has not seen her primary care provider or an orthopedic specialist.  She denies numbness of the extremity, known injury to the ankle, and open wound  Past Medical History:  Diagnosis Date  . Anxiety   . Cirrhosis of liver ([place])   . Depression   . Heart murmur    "not concerned about "   . History of abdominal paracentesis   . History of blood transfusion   . Hypothyroidism   . Ileus ([place])   . Liver disease 2008  . Neuropathy   . Pancytopenia ([place])   . Pneumonia     Patient Active Problem List   Diagnosis Date Noted  . Ascites 11/25/2017  . Cellulitis, abdominal wall 11/25/2017  . SOB (shortness of breath) 11/25/2017  . Protein-calorie malnutrition, severe 11/06/2017  . Ogilvie's syndrome   . Abdominal distension   . Abdominal pain   . Liver failure without hepatic coma ([place])   . Thrombocytopenia ([place])   . Closed fracture of lumbar spine without lesion of spinal cord ([place])   . Cirrhosis of liver ([place]) 04/21/2017  . Anemia 04/21/2017  . History of open reduction and internal fixation (ORIF) procedure 04/21/2017  . Visit for suture  removal 04/21/2017  . Left Olecranon fracture 04/21/2017  . Hypokalemia 04/21/2017    Past Surgical History:  Procedure Laterality Date  . COLONOSCOPY WITH ESOPHAGOGASTRODUODENOSCOPY (EGD) AND ESOPHAGEAL DILATION (ED)    . ESOPHAGOGASTRODUODENOSCOPY W/ BANDING     Varices  . FRACTURE SURGERY Left    x 2  . HARDWARE REMOVAL Left 09/17/2017   Procedure: HARDWARE REMOVAL LEFT OLECRANON;  Surgeon: Altamese Belleville, MD;  Location: [place];  Service: Orthopedics;  Laterality: Left;  . HIP SURGERY Left    from fracture- rod in palce  . IR PARACENTESIS  11/18/2017  . IR PARACENTESIS  11/26/2017  . KNEE SURGERY Bilateral    open incision for ligaments  . ORIF ELBOW FRACTURE Left 04/30/2017   Procedure: REVISION OPEN REDUCTION INTERNAL FIXATION (ORIF) ELBOW/OLECRANON FRACTURE;  Surgeon: Altamese [place]way, MD;  Location: [place];  Service: Orthopedics;  Laterality: Left;    OB History    No data available       Home Medications    Prior to Admission medications   Medication Sig Start Date End Date Taking? Authorizing Provider  furosemide (LASIX) 40 MG tablet Take 1 tablet (40 mg total) by mouth 2 (two) times daily. 01/05/18   Armbruster, Carlota Raspberry, MD  gabapentin (NEURONTIN) 100 MG capsule Take 2 capsules (200 mg total) by  mouth 3 (three) times daily. 11/09/17   Cherene Altes, MD  HYDROcodone-acetaminophen (NORCO/VICODIN) 5-325 MG tablet Take one tab po q 4 hrs prn pain 01/06/18   Ahmira Boisselle, PA-C  ibuprofen (ADVIL,MOTRIN) 200 MG tablet Take 200 mg by mouth every 6 (six) hours as needed.    [provider]  lactulose (CHRONULAC) 10 GM/15ML solution Take 30 mLs (20 g total) by mouth 3 (three) times daily. 12/01/17   Aline August, MD  levothyroxine (SYNTHROID, LEVOTHROID) 75 MCG tablet Take 1 tablet (75 mcg total) by mouth daily before breakfast. 12/02/17   Aline August, MD  ondansetron (ZOFRAN-ODT) 4 MG disintegrating tablet Take 4 mg by mouth every 8 (eight) hours as needed for nausea  or vomiting.    [provider]  oxyCODONE (OXY IR/ROXICODONE) 5 MG immediate release tablet Take 1 tablet (5 mg total) by mouth every 6 (six) hours as needed for severe pain. 12/01/17   Aline August, MD  polyethylene glycol (MIRALAX / GLYCOLAX) packet Take 17 g by mouth daily.    [provider]  potassium chloride SA (K-DUR,KLOR-CON) 20 MEQ tablet Take 1 tablet (20 mEq total) by mouth daily. 12/01/17   Aline August, MD  Protein POWD 1 Dose by Does not apply route daily.    [provider]  QUEtiapine (SEROQUEL) 25 MG tablet Take 25 mg by mouth daily.     [provider]  rifaximin (XIFAXAN) 550 MG TABS tablet Take 1 tablet (550 mg total) by mouth 2 (two) times daily. 11/09/17   Cherene Altes, MD  sertraline (ZOLOFT) 100 MG tablet Take 100 mg by mouth daily.     [provider]  spironolactone (ALDACTONE) 100 MG tablet Take 2 tablets (200 mg total) by mouth daily. 12/02/17   Aline August, MD  traZODone (DESYREL) 50 MG tablet Take 50 mg at bedtime by mouth.    [provider]    Family History Family History  Adopted: Yes    Social History Social History   Tobacco Use  . Smoking status: Never Smoker  . Smokeless tobacco: Never Used  Substance Use Topics  . Alcohol use: No  . Drug use: No     Allergies   Penicillins and Sulfa antibiotics   Review of Systems Review of Systems  Constitutional: Negative for chills and fever.  Gastrointestinal: Negative for abdominal pain, nausea and vomiting.  Musculoskeletal: Positive for arthralgias (Left ankle pain swelling) and joint swelling. Negative for back pain and neck pain.  Skin: Negative for color change and wound.  Neurological: Negative for weakness and numbness.  All other systems reviewed and are negative.    Physical Exam Updated Vital Signs BP (!) 142/82 (BP Location: Left Arm)   Pulse 80   Temp 98 F (36.7 C) (Oral)   Resp 18   Ht 5' 7"  (1.702 m)   Wt 61.2  kg (135 lb)   SpO2 100%   BMI 21.14 kg/m   Physical Exam  Constitutional: She is oriented to person, place, and time. She appears well-developed and well-nourished. No distress.  Patient is frail-appearing and appears older than her stated age  HENT:  Head: Atraumatic.  Cardiovascular: Normal rate, regular rhythm and intact distal pulses.  Dorsalis pedis and posterior tibial pulses are strong and palpable bilaterally  Pulmonary/Chest: Effort normal and breath sounds normal. No respiratory distress.  Musculoskeletal: She exhibits tenderness and deformity.  Tenderness to palpation of the medial left ankle, with medial malleolar prominence.  There is  a valgus deformity of the foot and hammertoes.  No skin changes  Neurological: She is alert and oriented to person, place, and time. No sensory deficit. She exhibits normal muscle tone. Coordination normal.  Skin: Skin is warm and dry. Capillary refill takes less than 2 seconds. No rash noted.  Nursing note and vitals reviewed.    ED Treatments / Results  Labs (all labs ordered are listed, but only abnormal results are displayed) Labs Reviewed - No data to display  EKG  EKG Interpretation None       Radiology Dg Ankle Complete Left  Result Date: 01/06/2018 CLINICAL DATA:  Pain since fall in December EXAM: LEFT ANKLE COMPLETE - 3+ VIEW COMPARISON:  None. FINDINGS: Bones appear osteopenic. Lateral greater than medial soft tissue swelling. Ankle mortise is symmetric. No acute displaced fracture is seen IMPRESSION: 1. Soft tissue swelling without definite acute displaced fracture 2. Bones appear osteopenic. Electronically Signed   By: Donavan Foil M.D.   On: 01/06/2018 19:49    Procedures Procedures (including critical care time)  Medications Ordered in ED Medications  HYDROcodone-acetaminophen (NORCO/VICODIN) 5-325 MG per tablet 1 tablet (1 tablet Oral Given 01/06/18 2054)     Initial Impression / Assessment and Plan / ED Course    I have reviewed the triage vital signs and the nursing notes.  Pertinent labs & imaging results that were available during my care of the patient were reviewed by me and considered in my medical decision making (see chart for details).     Patient with chronic appearing changes of the left ankle.  She has not been seen by orthopedics.  Do not feel this is an acute process.  She is neurovascularly intact.  I have discussed importance of orthopedic follow-up for this condition, she requests referral information.  Will apply ASO for support.  She appears stable for discharge and agrees to treatment plan  Final Clinical Impressions(s) / ED Diagnoses   Final diagnoses:  Chronic pain of left ankle    ED Discharge Orders        Ordered    HYDROcodone-acetaminophen (NORCO/VICODIN) 5-325 MG tablet     01/06/18 2024       Kem Parkinson, PA-C 01/08/18 0109    Julianne Rice, MD 01/10/18 1500

## 2018-01-13 ENCOUNTER — Telehealth: Payer: Self-pay

## 2018-01-13 DIAGNOSIS — M25572 Pain in left ankle and joints of left foot: Secondary | ICD-10-CM | POA: Diagnosis not present

## 2018-01-13 NOTE — Telephone Encounter (Signed)
Thanks Jan. I am also awaiting her follow up labs, I think she should be going to the lab in the next 1-2 days.

## 2018-01-13 NOTE — Telephone Encounter (Signed)
Pt has appt with [facility], Dawn Drazek, on 01-17-18.  Notes from office visit will be viewable in Epic under the "Media" tab after the appt.

## 2018-01-17 ENCOUNTER — Other Ambulatory Visit (INDEPENDENT_AMBULATORY_CARE_PROVIDER_SITE_OTHER): Payer: Medicare HMO

## 2018-01-17 DIAGNOSIS — I8500 Esophageal varices without bleeding: Secondary | ICD-10-CM | POA: Diagnosis not present

## 2018-01-17 DIAGNOSIS — R188 Other ascites: Secondary | ICD-10-CM

## 2018-01-17 DIAGNOSIS — K7290 Hepatic failure, unspecified without coma: Secondary | ICD-10-CM | POA: Diagnosis not present

## 2018-01-17 DIAGNOSIS — K7460 Unspecified cirrhosis of liver: Secondary | ICD-10-CM

## 2018-01-17 DIAGNOSIS — K7030 Alcoholic cirrhosis of liver without ascites: Secondary | ICD-10-CM | POA: Diagnosis not present

## 2018-01-17 LAB — BASIC METABOLIC PANEL
BUN: 12 mg/dL (ref 6–23)
CO2: 27 mEq/L (ref 19–32)
Calcium: 9.1 mg/dL (ref 8.4–10.5)
Chloride: 100 mEq/L (ref 96–112)
Creatinine, Ser: 0.62 mg/dL (ref 0.40–1.20)
GFR: 104.6 mL/min (ref >60.00)
Glucose, Bld: 92 mg/dL (ref 70–99)
Potassium: 4 mEq/L (ref 3.5–5.1)
Sodium: 136 mEq/L (ref 135–145)

## 2018-01-18 ENCOUNTER — Other Ambulatory Visit: Payer: Self-pay | Admitting: Adult Health

## 2018-01-18 ENCOUNTER — Encounter: Payer: Self-pay | Admitting: Adult Health

## 2018-01-18 ENCOUNTER — Other Ambulatory Visit (HOSPITAL_COMMUNITY)
Admission: RE | Admit: 2018-01-18 | Discharge: 2018-01-18 | Disposition: A | Discharge status: Home / Self Care | Payer: Medicare HMO | Source: Ambulatory Visit | Attending: Adult Health | Admitting: Adult Health

## 2018-01-18 ENCOUNTER — Ambulatory Visit (INDEPENDENT_AMBULATORY_CARE_PROVIDER_SITE_OTHER): Payer: Medicare HMO | Admitting: Adult Health

## 2018-01-18 VITALS — BP 128/80 | HR 75 | Ht 65.0 in | Wt 131.5 lb

## 2018-01-18 DIAGNOSIS — Z1211 Encounter for screening for malignant neoplasm of colon: Secondary | ICD-10-CM

## 2018-01-18 DIAGNOSIS — Z7189 Other specified counseling: Secondary | ICD-10-CM | POA: Insufficient documentation

## 2018-01-18 DIAGNOSIS — Z01419 Encounter for gynecological examination (general) (routine) without abnormal findings: Secondary | ICD-10-CM

## 2018-01-18 DIAGNOSIS — Z124 Encounter for screening for malignant neoplasm of cervix: Secondary | ICD-10-CM

## 2018-01-18 DIAGNOSIS — Z1212 Encounter for screening for malignant neoplasm of rectum: Secondary | ICD-10-CM | POA: Diagnosis not present

## 2018-01-18 DIAGNOSIS — Z780 Asymptomatic menopausal state: Secondary | ICD-10-CM | POA: Diagnosis not present

## 2018-01-18 DIAGNOSIS — Z01411 Encounter for gynecological examination (general) (routine) with abnormal findings: Secondary | ICD-10-CM

## 2018-01-18 DIAGNOSIS — Z1231 Encounter for screening mammogram for malignant neoplasm of breast: Secondary | ICD-10-CM

## 2018-01-18 LAB — HEMOCCULT GUIAC POC 1CARD (OFFICE): Fecal Occult Blood, POC: NEGATIVE

## 2018-01-18 NOTE — Progress Notes (Signed)
Patient ID: Megan Roberson, female   DOB: [birth year], 62 y.o.   MRN: [id number] History of Present Illness: Megan Roberson is a 62 year old white female, married, PM in for a well woman gyn exam, and pap.Has history of multiple fractures and has cirrhosis of the liver and sees Dr Enis Gash, in [place]. She lives in [place], after moving there from [place].  PCP is Dr Nevada Crane.    Current Medications, Allergies, Past Medical History, Past Surgical History, Family History and Social History were reviewed in [employer] record.     Review of Systems: Patient denies any headaches, hearing loss, fatigue, blurred vision, shortness of breath, chest pain, abdominal pain, problems with bowel movements, urination, or intercourse. No joint pain or mood swings.    Physical Exam:BP 128/80 (BP Location: Left Arm, Patient Position: Sitting, Cuff Size: Normal)   Pulse 75   Ht 5' 5"  (1.651 m)   Wt 131 lb 8 oz (59.6 kg)   BMI 21.88 kg/m She is using hand walker. General:  Well developed, well nourished, no acute distress Skin:  Warm and dry Neck:  Midline trachea, normal thyroid, good ROM, no lymphadenopathy Lungs; Clear to auscultation bilaterally Breast:  No dominant palpable mass, retraction, or nipple discharge,has bilateral implants for about 15 years Cardiovascular: Regular rate and rhythm Abdomen:  Soft, non tender, has small umbilical hernia and liver slightly enlarged, ?mild ascites  Pelvic:  External genitalia is normal in appearance, no lesions.  The vagina is pale and has loss of moisture and rugae. Urethra has no lesions or masses. The cervix is atrophic, pap with HPV performed.  Uterus is felt to be normal size, shape, and contour.  No adnexal masses or tenderness noted.Bladder is non tender, no masses felt. Rectal: Good sphincter tone, no polyps, or hemorrhoids felt.  Hemoccult negative. Extremities/musculoskeletal:  No swelling or varicosities noted, no clubbing or  cyanosis Psych:  No mood changes, alert and cooperative,seems happy PHQ 9 score 10, denies being suicidal is on meds and sees Dr Nevada Crane.  Impression:  1. Encounter for gynecological examination with Papanicolaou smear of cervix   2. Routine cervical smear   3. Screening for colorectal cancer   4. Postmenopause      Plan: Will DEXA scan 3/26 at 3 pm at [facility] same day as DEXA Labs with PCP Physical in 1 year Pap in 3 years if normal Colonoscopy per GI

## 2018-01-20 LAB — CYTOLOGY - PAP
Diagnosis: NEGATIVE
HPV: NOT DETECTED

## 2018-01-24 DIAGNOSIS — M25572 Pain in left ankle and joints of left foot: Secondary | ICD-10-CM | POA: Diagnosis not present

## 2018-01-26 ENCOUNTER — Encounter (HOSPITAL_COMMUNITY): Payer: Self-pay

## 2018-01-26 ENCOUNTER — Ambulatory Visit (HOSPITAL_COMMUNITY)
Admission: RE | Admit: 2018-01-26 | Discharge: 2018-01-26 | Disposition: A | Discharge status: Home / Self Care | Payer: Medicare HMO | Source: Ambulatory Visit | Attending: Adult Health | Admitting: Adult Health

## 2018-01-26 DIAGNOSIS — Z1231 Encounter for screening mammogram for malignant neoplasm of breast: Secondary | ICD-10-CM

## 2018-01-26 DIAGNOSIS — Z780 Asymptomatic menopausal state: Secondary | ICD-10-CM | POA: Diagnosis not present

## 2018-01-26 DIAGNOSIS — M810 Age-related osteoporosis without current pathological fracture: Secondary | ICD-10-CM | POA: Insufficient documentation

## 2018-01-26 DIAGNOSIS — M85831 Other specified disorders of bone density and structure, right forearm: Secondary | ICD-10-CM | POA: Diagnosis not present

## 2018-01-27 ENCOUNTER — Other Ambulatory Visit: Payer: Self-pay

## 2018-01-27 DIAGNOSIS — K7460 Unspecified cirrhosis of liver: Secondary | ICD-10-CM

## 2018-01-27 DIAGNOSIS — R188 Other ascites: Secondary | ICD-10-CM

## 2018-01-28 ENCOUNTER — Other Ambulatory Visit: Payer: Self-pay

## 2018-01-28 DIAGNOSIS — R188 Other ascites: Secondary | ICD-10-CM

## 2018-01-28 DIAGNOSIS — K7460 Unspecified cirrhosis of liver: Secondary | ICD-10-CM

## 2018-02-01 ENCOUNTER — Telehealth: Payer: Self-pay

## 2018-02-01 NOTE — Telephone Encounter (Signed)
-----   Message from Yetta Flock, MD sent at 01/28/2018  4:36 PM EDT ----- Yes I'd like to keep that appointment, ensure she is doing okay, I may need to adjust her diuretics. Thanks for checking.   ----- Message ----- From: Doristine Counter, RN Sent: 01/28/2018   2:38 PM To: Yetta Flock, MD  Do you want patient to keep her follow up appointment with you on 5/7? She is the hospital endocolon with variceal banding (5/14). I have scheduled her a pre-visit with the endo nurses on 4/30, she will also get her CBC checked that day. Thanks, Almyra Free

## 2018-02-01 NOTE — Telephone Encounter (Signed)
Mailed appointment information to patient, and to also keep her appointment on 5/7 here with Dr. Havery Moros.

## 2018-02-02 ENCOUNTER — Telehealth: Payer: Self-pay | Admitting: Adult Health

## 2018-02-02 NOTE — Telephone Encounter (Signed)
NO Answer

## 2018-02-04 DIAGNOSIS — M25572 Pain in left ankle and joints of left foot: Secondary | ICD-10-CM | POA: Diagnosis not present

## 2018-02-09 ENCOUNTER — Telehealth: Payer: Self-pay | Admitting: Adult Health

## 2018-02-09 ENCOUNTER — Encounter: Payer: Self-pay | Admitting: Adult Health

## 2018-02-09 DIAGNOSIS — M8000XA Age-related osteoporosis with current pathological fracture, unspecified site, initial encounter for fracture: Secondary | ICD-10-CM

## 2018-02-09 DIAGNOSIS — M810 Age-related osteoporosis without current pathological fracture: Secondary | ICD-10-CM

## 2018-02-09 HISTORY — DX: Age-related osteoporosis without current pathological fracture: M81.0

## 2018-02-09 NOTE — Telephone Encounter (Signed)
Pt aware DEXA showed osteoporosis, will send copy to Dr Nevada Crane, for him to manage meds for this.

## 2018-02-11 ENCOUNTER — Telehealth: Payer: Self-pay

## 2018-02-11 NOTE — Telephone Encounter (Signed)
She called back and I explained that the enrollment form needed to be signed today, she needs to find a ride.  She will let us know if she can find a ride.

## 2018-02-11 NOTE — Telephone Encounter (Signed)
Called patient and restated previous message that we will send the enrollment form in with out a signature and the [employer] will contact her for verbal authorization.

## 2018-02-11 NOTE — Telephone Encounter (Signed)
The rep from [employer] stopped by and said that we could send the enrollment form without her signature and then the company would get a verbal authorization.  I left a message for the patient letting her know.

## 2018-02-11 NOTE — Telephone Encounter (Signed)
-----   Message from Yetta Flock, MD sent at 02/10/2018  8:10 AM EDT ----- Regarding: Doptelet order This patient will likely need Doptelet prior to EGD / colonoscopy currently scheduled at the hospital on 5/14. I spoke with the drug rep about this. If we will out a form he provided to me and submit it, she will be guaranteed the drug for free even if insurance doesn't cover it. Needs to be submitted 30 days in advance of the procedure. I will fill out my part if you can help with the rest. Thanks

## 2018-02-11 NOTE — Telephone Encounter (Signed)
I attempted to reach the patient twice on 02/10/18, lmtcb both times.  Attempted to reach patient and/or spouse this morning, lmtcb on both voice mails,  Doptelet patient enrollment is complete with the exception of the patient signature.

## 2018-02-11 NOTE — Telephone Encounter (Signed)
Pt returned your call and would like a cb. Thank you.

## 2018-02-11 NOTE — Telephone Encounter (Signed)
Thanks great, thanks for your help with this and the update.

## 2018-02-15 DIAGNOSIS — I8500 Esophageal varices without bleeding: Secondary | ICD-10-CM | POA: Diagnosis not present

## 2018-02-15 DIAGNOSIS — K7030 Alcoholic cirrhosis of liver without ascites: Secondary | ICD-10-CM | POA: Diagnosis not present

## 2018-02-15 DIAGNOSIS — R188 Other ascites: Secondary | ICD-10-CM | POA: Diagnosis not present

## 2018-02-15 DIAGNOSIS — K7290 Hepatic failure, unspecified without coma: Secondary | ICD-10-CM | POA: Diagnosis not present

## 2018-02-15 NOTE — Telephone Encounter (Signed)
Pt would like to speak with you regarding form that she needs to sign. Thank you

## 2018-02-15 NOTE — Telephone Encounter (Signed)
She is going to stop in to the office this afternoon and sign the enrollment form.

## 2018-02-16 ENCOUNTER — Other Ambulatory Visit: Payer: Self-pay | Admitting: Nurse Practitioner

## 2018-02-16 ENCOUNTER — Telehealth: Payer: Self-pay

## 2018-02-16 DIAGNOSIS — M8580 Other specified disorders of bone density and structure, unspecified site: Secondary | ICD-10-CM | POA: Diagnosis not present

## 2018-02-16 DIAGNOSIS — K5900 Constipation, unspecified: Secondary | ICD-10-CM | POA: Diagnosis not present

## 2018-02-16 DIAGNOSIS — G9009 Other idiopathic peripheral autonomic neuropathy: Secondary | ICD-10-CM | POA: Diagnosis not present

## 2018-02-16 DIAGNOSIS — F39 Unspecified mood [affective] disorder: Secondary | ICD-10-CM | POA: Diagnosis not present

## 2018-02-16 DIAGNOSIS — K7460 Unspecified cirrhosis of liver: Secondary | ICD-10-CM | POA: Diagnosis not present

## 2018-02-16 DIAGNOSIS — E039 Hypothyroidism, unspecified: Secondary | ICD-10-CM | POA: Diagnosis not present

## 2018-02-16 DIAGNOSIS — G4700 Insomnia, unspecified: Secondary | ICD-10-CM | POA: Diagnosis not present

## 2018-02-16 DIAGNOSIS — K7030 Alcoholic cirrhosis of liver without ascites: Secondary | ICD-10-CM

## 2018-02-16 NOTE — Telephone Encounter (Signed)
Left message for patient that I was returning her call, she had questions re: appointment. Patient signed paperwork and was re-faxed to to [employer]. Currently request is in process.

## 2018-02-16 NOTE — Telephone Encounter (Signed)
-----   Message from Yetta Flock, MD sent at 02/10/2018  8:10 AM EDT ----- Regarding: Doptelet order This patient will likely need Doptelet prior to EGD / colonoscopy currently scheduled at the hospital on 5/14. I spoke with the drug rep about this. If we will out a form he provided to me and submit it, she will be guaranteed the drug for free even if insurance doesn't cover it. Needs to be submitted 30 days in advance of the procedure. I will fill out my part if you can help with the rest. Thanks

## 2018-02-17 ENCOUNTER — Other Ambulatory Visit: Payer: Self-pay

## 2018-02-17 ENCOUNTER — Telehealth: Payer: Self-pay

## 2018-02-17 MED ORDER — AVATROMBOPAG MALEATE 20 MG PO TABS: 40.0000 mg | ORAL_TABLET | Freq: Every day | ORAL | 0 refills | Status: AC

## 2018-02-17 NOTE — Telephone Encounter (Signed)
Patient has been approved for the Doptelet medication. She has an office visit with you on 5/7 and the EGD on 5/14. When should she start this medication?

## 2018-02-17 NOTE — Telephone Encounter (Signed)
Called patient and let her know that doptelet has been approved, Rx sent to [facility], [phone]. Let her know and it is also on prescription to start taking this medication on 03/05/18-03/10/18. If she has questions to please contact our office. Also let her know to call the pharmacy to see when to expect delivery.

## 2018-02-18 NOTE — Telephone Encounter (Signed)
We should plan a CBC to be done 14 days prior to the procedure. Pending that result, we will take her when to take it. Thanks much

## 2018-02-22 ENCOUNTER — Other Ambulatory Visit: Payer: Self-pay

## 2018-02-22 NOTE — Telephone Encounter (Signed)
Left message for patient that Dr. Havery Moros would like to have another CBC done 14 days prior to procedure. Asked her to come in next Tuesday, 03/01/18 to have that done.

## 2018-02-23 ENCOUNTER — Telehealth: Payer: Self-pay | Admitting: Gastroenterology

## 2018-02-23 ENCOUNTER — Other Ambulatory Visit: Payer: Self-pay

## 2018-02-23 ENCOUNTER — Other Ambulatory Visit: Payer: Self-pay | Admitting: Gastroenterology

## 2018-02-23 NOTE — Telephone Encounter (Signed)
Left message I was returning her call.

## 2018-02-28 ENCOUNTER — Telehealth: Payer: Self-pay | Admitting: Gastroenterology

## 2018-02-28 NOTE — Telephone Encounter (Signed)
Spoke to patient, she is having a hard time getting a ride. I let her know that the labs need to be drawn tomorrow, also reminded her that she has a pre-visit appointment with the endoscopy nurse tomorrow too. She will continue to try to get ride worked out.

## 2018-03-01 ENCOUNTER — Other Ambulatory Visit (INDEPENDENT_AMBULATORY_CARE_PROVIDER_SITE_OTHER): Payer: Medicare HMO

## 2018-03-01 ENCOUNTER — Other Ambulatory Visit: Payer: Self-pay

## 2018-03-01 ENCOUNTER — Ambulatory Visit (AMBULATORY_SURGERY_CENTER): Payer: Self-pay

## 2018-03-01 ENCOUNTER — Telehealth: Payer: Self-pay

## 2018-03-01 VITALS — Ht 66.0 in | Wt 137.0 lb

## 2018-03-01 DIAGNOSIS — R188 Other ascites: Secondary | ICD-10-CM

## 2018-03-01 DIAGNOSIS — Z1211 Encounter for screening for malignant neoplasm of colon: Secondary | ICD-10-CM

## 2018-03-01 DIAGNOSIS — K7460 Unspecified cirrhosis of liver: Secondary | ICD-10-CM | POA: Diagnosis not present

## 2018-03-01 LAB — CBC WITH DIFFERENTIAL/PLATELET
Basophils Absolute: 0 10*3/uL (ref 0.0–0.1)
Basophils Relative: 0.3 % (ref 0.0–3.0)
Eosinophils Absolute: 0.2 10*3/uL (ref 0.0–0.7)
Eosinophils Relative: 5.5 % — ABNORMAL HIGH (ref 0.0–5.0)
HCT: 32.9 % — ABNORMAL LOW (ref 36.0–46.0)
Hemoglobin: 11 g/dL — ABNORMAL LOW (ref 12.0–15.0)
Lymphocytes Relative: 15.8 % (ref 12.0–46.0)
Lymphs Abs: 0.7 10*3/uL (ref 0.7–4.0)
MCHC: 33.5 g/dL (ref 30.0–36.0)
MCV: 85.9 fl (ref 78.0–100.0)
Monocytes Absolute: 0.9 10*3/uL (ref 0.1–1.0)
Monocytes Relative: 21.3 % — ABNORMAL HIGH (ref 3.0–12.0)
Neutro Abs: 2.4 10*3/uL (ref 1.4–7.7)
Neutrophils Relative %: 57.1 % (ref 43.0–77.0)
Platelets: 41 10*3/uL — CL (ref 150.0–400.0)
RBC: 3.83 Mil/uL — ABNORMAL LOW (ref 3.87–5.11)
RDW: 15.8 % — ABNORMAL HIGH (ref 11.5–15.5)
WBC: 4.3 10*3/uL (ref 4.0–10.5)

## 2018-03-01 LAB — BASIC METABOLIC PANEL
BUN: 10 mg/dL (ref 6–23)
CO2: 31 mEq/L (ref 19–32)
Calcium: 8.9 mg/dL (ref 8.4–10.5)
Chloride: 97 mEq/L (ref 96–112)
Creatinine, Ser: 0.75 mg/dL (ref 0.40–1.20)
GFR: 83.93 mL/min (ref >60.00)
Glucose, Bld: 97 mg/dL (ref 70–99)
Potassium: 3.1 mEq/L — ABNORMAL LOW (ref 3.5–5.1)
Sodium: 136 mEq/L (ref 135–145)

## 2018-03-01 MED ORDER — NA SULFATE-K SULFATE-MG SULF 17.5-3.13-1.6 GM/177ML PO SOLN: 1.0000 | Freq: Once | ORAL | 0 refills | Status: AC

## 2018-03-01 NOTE — Telephone Encounter (Signed)
Recommendations per lab result note.

## 2018-03-01 NOTE — Progress Notes (Signed)
Denies allergies to eggs or soy products. Denies complication of anesthesia or sedation. Denies use of weight loss medication. Denies use of O2.   Emmi instructions declined.  

## 2018-03-01 NOTE — Telephone Encounter (Signed)
Lab called critical level for platelets at 42.

## 2018-03-02 ENCOUNTER — Other Ambulatory Visit: Payer: Self-pay

## 2018-03-02 DIAGNOSIS — K7460 Unspecified cirrhosis of liver: Secondary | ICD-10-CM

## 2018-03-02 DIAGNOSIS — R188 Other ascites: Secondary | ICD-10-CM

## 2018-03-02 DIAGNOSIS — E876 Hypokalemia: Secondary | ICD-10-CM

## 2018-03-02 NOTE — Telephone Encounter (Signed)
Patient returned phone call. Best # [phone]

## 2018-03-04 DIAGNOSIS — S82875A Nondisplaced pilon fracture of left tibia, initial encounter for closed fracture: Secondary | ICD-10-CM | POA: Diagnosis not present

## 2018-03-07 ENCOUNTER — Ambulatory Visit
Admission: RE | Admit: 2018-03-07 | Discharge: 2018-03-07 | Disposition: A | Discharge status: Home / Self Care | Payer: Medicare HMO | Source: Ambulatory Visit | Attending: Nurse Practitioner | Admitting: Nurse Practitioner

## 2018-03-07 DIAGNOSIS — K7460 Unspecified cirrhosis of liver: Secondary | ICD-10-CM | POA: Diagnosis not present

## 2018-03-07 DIAGNOSIS — K7030 Alcoholic cirrhosis of liver without ascites: Secondary | ICD-10-CM

## 2018-03-08 ENCOUNTER — Other Ambulatory Visit (INDEPENDENT_AMBULATORY_CARE_PROVIDER_SITE_OTHER): Payer: Medicare HMO

## 2018-03-08 ENCOUNTER — Encounter: Payer: Self-pay | Admitting: Gastroenterology

## 2018-03-08 ENCOUNTER — Ambulatory Visit (INDEPENDENT_AMBULATORY_CARE_PROVIDER_SITE_OTHER): Payer: Medicare HMO | Admitting: Gastroenterology

## 2018-03-08 VITALS — BP 130/72 | HR 91 | Ht 67.0 in | Wt 135.0 lb

## 2018-03-08 DIAGNOSIS — I8500 Esophageal varices without bleeding: Secondary | ICD-10-CM

## 2018-03-08 DIAGNOSIS — K7031 Alcoholic cirrhosis of liver with ascites: Secondary | ICD-10-CM

## 2018-03-08 DIAGNOSIS — R188 Other ascites: Secondary | ICD-10-CM | POA: Diagnosis not present

## 2018-03-08 DIAGNOSIS — K7460 Unspecified cirrhosis of liver: Secondary | ICD-10-CM | POA: Diagnosis not present

## 2018-03-08 DIAGNOSIS — K7290 Hepatic failure, unspecified without coma: Secondary | ICD-10-CM | POA: Diagnosis not present

## 2018-03-08 DIAGNOSIS — D696 Thrombocytopenia, unspecified: Secondary | ICD-10-CM

## 2018-03-08 DIAGNOSIS — E876 Hypokalemia: Secondary | ICD-10-CM

## 2018-03-08 DIAGNOSIS — K7682 Hepatic encephalopathy: Secondary | ICD-10-CM

## 2018-03-08 LAB — COMPREHENSIVE METABOLIC PANEL
ALT: 24 U/L (ref 0–35)
AST: 45 U/L — ABNORMAL HIGH (ref 0–37)
Albumin: 3.6 g/dL (ref 3.5–5.2)
Alkaline Phosphatase: 234 U/L — ABNORMAL HIGH (ref 39–117)
BUN: 12 mg/dL (ref 6–23)
CO2: 31 mEq/L (ref 19–32)
Calcium: 9.6 mg/dL (ref 8.4–10.5)
Chloride: 97 mEq/L (ref 96–112)
Creatinine, Ser: 0.73 mg/dL (ref 0.40–1.20)
GFR: 86.59 mL/min (ref >60.00)
Glucose, Bld: 171 mg/dL — ABNORMAL HIGH (ref 70–99)
Potassium: 3 mEq/L — ABNORMAL LOW (ref 3.5–5.1)
Sodium: 137 mEq/L (ref 135–145)
Total Bilirubin: 2 mg/dL — ABNORMAL HIGH (ref 0.2–1.2)
Total Protein: 7 g/dL (ref 6.0–8.3)

## 2018-03-08 LAB — PROTIME-INR
INR: 1.2 ratio — ABNORMAL HIGH (ref 0.8–1.0)
Prothrombin Time: 14.2 s — ABNORMAL HIGH (ref 9.6–13.1)

## 2018-03-08 NOTE — Patient Instructions (Addendum)
If you are age 60 or older, your body mass index should be between 23-30. Your Body mass index is 21.14 kg/m. If this is out of the aforementioned range listed, please consider follow up with your Primary Care Provider.  If you are age 79 or younger, your body mass index should be between 19-25. Your Body mass index is 21.14 kg/m. If this is out of the aformentioned range listed, please consider follow up with your Primary Care Provider.   Please go to the lab in the basement of our building to have lab work done as you leave today.  Thank you for entrusting me with your care and for choosing [facility], Dr. Sadler Cellar

## 2018-03-08 NOTE — Progress Notes (Signed)
HPI :  62 year old female here for a follow-up visit. 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 See last clinic visit for full details of her history. She's had decompensations to include ascites, hepatic encephalopathy, and bleeding varices in the past. She previously lived in [place] removed to this area last summer. She denies admitted to the hospital a few times and she is moved here with worsening hepatic encephalopathy, he's had a fall which led to a fracture of her foot, and also had issues with cellulitis related to the paracentesis.  At the last visit when better we addressed several issues. She was ultimately referred to Dr. Precious Gilding office for liver transplant evaluation, this workup is ongoing. We stopped all her NSAIDs and narcotics.She's had some of her diuretics adjusted. She has not been on beta blocker given severe ascites and volume status issues. She has continued lactulose and rifaximin with good control of encephalopathy. She is scheduled for upper endoscopy and colonoscopy with me next week in the hospital. Her platelets are chronically in the 40s. She has been on Doptelet to increase her platelet count for this procedure - taking 39m once daily on Sat 03/05/18, for a total of 5 days (last dose on Wed May 8th). She is currently taking Lasix 40 mg once a day and Aldactone 200 mg a day, her Lasix dose was reduced from 456mBID given issues with hypokalemia. She had an ultrasound of her liver done yesterday which showed small moderate ascites, no mass lesions, she did have gallstones measuring up to 1.47 m in size. It was also recommended she take supplements for [facility] zinc, branch chain amino acids, LOLA,  Generally she is feeling pretty well since of last seen her. Volume issues have been managed with her diuretics. Ascites and lower extremity edema is much improved. She is not having any active issues with encephalopathy. No  abdominal pain. She has been dealing with a broken left foot, and she hit her left elbow which caused rather severe abrasion which she has been bandaging but appears to be healing slowly.  CT Scan 11/28/2017 - cirrhosis, suspected varices, diffuse ascites, gallstones, inflamed colon, pneumonia with suspected loculated effusion   She has not had an EGD in at least 5 years from her report.  She thinks her colonoscopy was last done around 5 years as well.  Past Medical History:  Diagnosis Date  . Anxiety   . Cir[place]osis of liver ([facility]  . Depression   . Heart murmur    "not concerned about "   . History of abdominal paracentesis   . History of blood transfusion   . Hypothyroidism   . Ileus ([facility]  . Liver disease 2008  . Neuropathy   . Osteoporosis 02/09/2018  . Pancytopenia ([facility]  . Pneumonia   . Vaginal Pap smear, abnormal      Past Surgical History:  Procedure Laterality Date  . AUGMENTATION MAMMAPLASTY Bilateral    20 years ago  . COLONOSCOPY WITH ESOPHAGOGASTRODUODENOSCOPY (EGD) AND ESOPHAGEAL DILATION (ED)    . ESOPHAGOGASTRODUODENOSCOPY W/ BANDING     Varices  . FRACTURE SURGERY Left    x 2  . HARDWARE REMOVAL Left 09/17/2017   Procedure: HARDWARE REMOVAL LEFT OLECRANON;  Surgeon: HaAltamese CarolinaMD;  [place];  Laterality: Left;  . HIP SURGERY Left    from fracture- rod in palce  . IR PARACENTESIS  11/18/2017  .  IR PARACENTESIS  11/26/2017  . KNEE SURGERY Bilateral    open incision for ligaments  . ORIF ELBOW FRACTURE Left 04/30/2017   Procedure: REVISION OPEN REDUCTION INTERNAL FIXATION (ORIF) ELBOW/OLECRANON FRACTURE;  Surgeon: Altamese [place], MD;  Location: [place];  Service: Orthopedics;  Laterality: Left;   Family History  Adopted: Yes  Problem Relation Age of Onset  . Alcohol abuse Daughter    Social History   Tobacco Use  . Smoking status: Never Smoker  . Smokeless tobacco: Never Used  Substance Use Topics  . Alcohol use: No    . Drug use: No   Current Outpatient Medications  Medication Sig Dispense Refill  . Amino Acids (L-CARNITINE PO) Take 1 tablet by mouth 3 (three) times daily.    . Avatrombopag Maleate (DOPTELET) 20 MG TABS Take 40 mg by mouth daily for 5 days. Take 2 tablets (40 mg) daily starting on 03/05/18 through 03/10/18, procedure on 03/15/18. 10 tablet 0  . furosemide (LASIX) 40 MG tablet Take 1 tablet (40 mg total) by mouth 2 (two) times daily. 120 tablet 3  . gabapentin (NEURONTIN) 100 MG capsule Take 2 capsules (200 mg total) by mouth 3 (three) times daily. 90 capsule 0  . lactulose (CHRONULAC) 10 GM/15ML solution Take 30 mLs (20 g total) by mouth 3 (three) times daily. 946 mL 0  . levothyroxine (SYNTHROID, LEVOTHROID) 75 MCG tablet Take 1 tablet (75 mcg total) by mouth daily before breakfast. 30 tablet 0  . ondansetron (ZOFRAN-ODT) 4 MG disintegrating tablet Take 4 mg by mouth every 8 (eight) hours as needed for nausea or vomiting.    Marland Kitchen oxycodone (OXY-IR) 5 MG capsule Take 5 mg by mouth every 4 (four) hours as needed.    Marland Kitchen PREDNISONE PO Take by mouth.    . Protein POWD 1 Dose by Does not apply route daily.    . QUEtiapine (SEROQUEL) 25 MG tablet Take 25 mg by mouth daily.     . rifaximin (XIFAXAN) 550 MG TABS tablet Take 1 tablet (550 mg total) by mouth 2 (two) times daily. 60 tablet 0  . sertraline (ZOLOFT) 100 MG tablet Take 100 mg by mouth daily.     Marland Kitchen spironolactone (ALDACTONE) 100 MG tablet Take 2 tablets (200 mg total) by mouth daily. 30 tablet 0  . traZODone (DESYREL) 50 MG tablet Take 50 mg at bedtime by mouth.     No current facility-administered medications for this visit.    Allergies  Allergen Reactions  . Penicillins Swelling    FACIAL SWELLING  PATIENT HAS HAD A PCN REACTION WITH IMMEDIATE RASH, FACIAL/TONGUE/THROAT SWELLING, SOB, OR LIGHTHEADEDNESS WITH HYPOTENSION:  #  #  #  YES  #  #  #   Has patient had a PCN reaction causing severe rash involving mucus membranes or skin  necrosis: No Has patient had a PCN reaction that required hospitalization: No Has patient had a PCN reaction occurring within the last 10 years: No If all of the above answers are "NO", then may proceed with Cephalosporin u  . Tylenol [Acetaminophen] Nausea Only  . Sulfa Antibiotics Itching and Rash     Review of Systems: All systems reviewed and negative except where noted in HPI.    US Abdomen Limited Ruq  Result Date: 03/07/2018 CLINICAL DATA:  62 year old female with cirrhosis. Subsequent encounter. EXAM: ULTRASOUND ABDOMEN LIMITED RIGHT UPPER QUADRANT COMPARISON:  11/28/2017 CT. FINDINGS: Gallbladder: Mobile gallstones measuring up to 1.4 cm. Gallbladder wall thickening measuring up to 3.7  mm. No tenderness over the gallbladder during scanning per sonographer. Common bile duct: Diameter: 4.3 mm Liver: Diffuse heterogeneity with nodular contour consistent with history of cirrhosis without focal mass noted. Portal vein is patent on color Doppler imaging with normal direction of blood flow towards the liver. Small amount of ascites. IMPRESSION: Cirrhotic liver without focal liver lesion. Portal vein appears patent. Small amount of ascites. Mobile gallstones measuring up to 1.4 cm. Mild gallbladder wall thickening may be related to hepatic disease rather than acute cholecystitis as the patient was not tender over the gallbladder during scanning. Electronically Signed   By: Genia Del M.D.   On: 03/07/2018 12:46   Lab Results  Component Value Date   WBC 4.3 03/01/2018   HGB 11.0 (L) 03/01/2018   HCT 32.9 (L) 03/01/2018   MCV 85.9 03/01/2018   PLT 41.0 cL (LL) 03/01/2018     Lab Results  Component Value Date   ALT 24 03/08/2018   AST 45 (H) 03/08/2018   ALKPHOS 234 (H) 03/08/2018   BILITOT 2.0 (H) 03/08/2018      Physical Exam: BP 130/72   Pulse 91   Ht 5' 7"  (1.702 m)   Wt 135 lb (61.2 kg)   SpO2 98%   BMI 21.14 kg/m  Constitutional: Pleasant, female in no acute  distress. HEENT: Normocephalic and atraumatic. Conjunctivae are normal.  Neck supple.  Cardiovascular: Normal rate, regular rhythm.  Pulmonary/chest: Effort normal and breath sounds normal. No wheezing, rales or rhonchi. Abdominal: Soft, mildly distended, nontender. There are no masses palpable.  Extremities: no edema, cast on left ankle, left elbow banded - skin abrasion noted with bruising Lymphadenopathy: No cervical adenopathy noted. Neurological: Alert and oriented to person place and time. No asterixis Skin: Skin is warm and dry. No rashes noted. Psychiatric: Normal mood and affect. Behavior is normal.   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 She has now been plugged in with the liver transplant service in town and going through the necessary steps to make her eligible for transplant. She's had some recent issues with hypokalemia on her diuretics, we'll recheck her kidney function today and adjust diuretics as needed. Her encephalopathy appears well-controlled on her present regimen. She is abstaining from alcohol. She is due for EGD with banding of varices if present, as well as colonoscopy. She is currently taking Doptelet in preparation for this given her thrombocytopenia. We have held off on nonselective beta blockade for treatment of varices given her volume issues / ascites in recent months.  Recommend the following: - CMET and INR to be done today - EGD with banding of varices / colonoscopy at the hospital next week. She has taken Doptelet to increase platelet count, will check CBC the morning of her procedure. I discussed the risk and benefits of endoscopy and anesthesia with her and she wants to proceed. - Continue lactulose and rifaximin, as well as Zinc, branch chain amino acids, LOLA - continue HCC screening every 6 months -  continue to follow up with Hepatology for transplant evaluation  Patient and husband in agreement with plan.   Jessup Cellar, MD [facility] Gastroenterology

## 2018-03-08 NOTE — H&P (View-Only) (Signed)
HPI :  62 year old female here for a follow-up visit. 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 See last clinic visit for full details of her history. She's had decompensations to include ascites, hepatic encephalopathy, and bleeding varices in the past. She previously lived in [place] removed to this area last summer. She denies admitted to the hospital a few times and she is moved here with worsening hepatic encephalopathy, he's had a fall which led to a fracture of her foot, and also had issues with cellulitis related to the paracentesis.  At the last visit when better we addressed several issues. She was ultimately referred to Dr. Precious Gilding office for liver transplant evaluation, this workup is ongoing. We stopped all her NSAIDs and narcotics.She's had some of her diuretics adjusted. She has not been on beta blocker given severe ascites and volume status issues. She has continued lactulose and rifaximin with good control of encephalopathy. She is scheduled for upper endoscopy and colonoscopy with me next week in the hospital. Her platelets are chronically in the 40s. She has been on Doptelet to increase her platelet count for this procedure - taking 77m once daily on Sat 03/05/18, for a total of 5 days (last dose on Wed May 8th). She is currently taking Lasix 40 mg once a day and Aldactone 200 mg a day, her Lasix dose was reduced from 420mBID given issues with hypokalemia. She had an ultrasound of her liver done yesterday which showed small moderate ascites, no mass lesions, she did have gallstones measuring up to 1.47 m in size. It was also recommended she take supplements for [facility]opathy [facility] zinc, branch chain amino acids, LOLA,  Generally she is feeling pretty well since of last seen her. Volume issues have been managed with her diuretics. Ascites and lower extremity edema is much improved. She is not having any active issues with encephalopathy. No  abdominal pain. She has been dealing with a broken left foot, and she hit her left elbow which caused rather severe abrasion which she has been bandaging but appears to be healing slowly.  CT Scan 11/28/2017 - cirrhosis, suspected varices, diffuse ascites, gallstones, inflamed colon, pneumonia with suspected loculated effusion   She has not had an EGD in at least 5 years from her report.  She thinks her colonoscopy was last done around 5 years as well.  Past Medical History:  Diagnosis Date  . Anxiety   . Cirrhosis of liver ([facility]  . Depression   . Heart murmur    "not concerned about "   . History of abdominal paracentesis   . History of blood transfusion   . Hypothyroidism   . [place]us ([place]  . Liver disease 2008  . Neuropathy   . Osteoporosis 02/09/2018  . Pancytopenia ([place]  . Pneumonia   . Vaginal Pap smear, abnormal      Past Surgical History:  Procedure Laterality Date  . AUGMENTATION MAMMAPLASTY Bilateral    20 years ago  . COLONOSCOPY WITH ESOPHAGOGASTRODUODENOSCOPY (EGD) AND ESOPHAGEAL DILATION (ED)    . ESOPHAGOGASTRODUODENOSCOPY W/ BANDING     Varices  . FRACTURE SURGERY Left    x 2  . HARDWARE REMOVAL Left 09/17/2017   Procedure: HARDWARE REMOVAL LEFT OLECRANON;  Surgeon: HaAltamese CarolinaMD;  [place] [facility];  Laterality: Left;  . HIP SURGERY Left    from fracture- rod in palce  . IR PARACENTESIS  11/18/2017  .  IR PARACENTESIS  11/26/2017  . KNEE SURGERY Bilateral    open incision for ligaments  . ORIF ELBOW FRACTURE Left 04/30/2017   Procedure: REVISION OPEN REDUCTION INTERNAL FIXATION (ORIF) ELBOW/OLECRANON FRACTURE;  Surgeon: Altamese Price, MD;  Location: [place];  Service: Orthopedics;  Laterality: Left;   Family History  Adopted: Yes  Problem Relation Age of Onset  . Alcohol abuse Daughter    Social History   Tobacco Use  . Smoking status: Never Smoker  . Smokeless tobacco: Never Used  Substance Use Topics  . Alcohol use: No    . Drug use: No   Current Outpatient Medications  Medication Sig Dispense Refill  . Amino Acids (L-CARNITINE PO) Take 1 tablet by mouth 3 (three) times daily.    . Avatrombopag Maleate (DOPTELET) 20 MG TABS Take 40 mg by mouth daily for 5 days. Take 2 tablets (40 mg) daily starting on 03/05/18 through 03/10/18, procedure on 03/15/18. 10 tablet 0  . furosemide (LASIX) 40 MG tablet Take 1 tablet (40 mg total) by mouth 2 (two) times daily. 120 tablet 3  . gabapentin (NEURONTIN) 100 MG capsule Take 2 capsules (200 mg total) by mouth 3 (three) times daily. 90 capsule 0  . lactulose (CHRONULAC) 10 GM/15ML solution Take 30 mLs (20 g total) by mouth 3 (three) times daily. 946 mL 0  . levothyroxine (SYNTHROID, LEVOTHROID) 75 MCG tablet Take 1 tablet (75 mcg total) by mouth daily before breakfast. 30 tablet 0  . ondansetron (ZOFRAN-ODT) 4 MG disintegrating tablet Take 4 mg by mouth every 8 (eight) hours as needed for nausea or vomiting.    Marland Kitchen oxycodone (OXY-IR) 5 MG capsule Take 5 mg by mouth every 4 (four) hours as needed.    Marland Kitchen PREDNISONE PO Take by mouth.    . Protein POWD 1 Dose by Does not apply route daily.    . QUEtiapine (SEROQUEL) 25 MG tablet Take 25 mg by mouth daily.     . rifaximin (XIFAXAN) 550 MG TABS tablet Take 1 tablet (550 mg total) by mouth 2 (two) times daily. 60 tablet 0  . sertraline (ZOLOFT) 100 MG tablet Take 100 mg by mouth daily.     Marland Kitchen spironolactone (ALDACTONE) 100 MG tablet Take 2 tablets (200 mg total) by mouth daily. 30 tablet 0  . traZODone (DESYREL) 50 MG tablet Take 50 mg at bedtime by mouth.     No current facility-administered medications for this visit.    Allergies  Allergen Reactions  . Penicillins Swelling    FACIAL SWELLING  PATIENT HAS HAD A PCN REACTION WITH IMMEDIATE RASH, FACIAL/TONGUE/THROAT SWELLING, SOB, OR LIGHTHEADEDNESS WITH HYPOTENSION:  #  #  #  YES  #  #  #   Has patient had a PCN reaction causing severe rash involving mucus membranes or skin  necrosis: No Has patient had a PCN reaction that required hospitalization: No Has patient had a PCN reaction occurring within the last 10 years: No If all of the above answers are "NO", then may proceed with Cephalosporin u  . Tylenol [Acetaminophen] Nausea Only  . Sulfa Antibiotics Itching and Rash     Review of Systems: All systems reviewed and negative except where noted in HPI.    US Abdomen Limited Ruq  Result Date: 03/07/2018 CLINICAL DATA:  62 year old female with cirrhosis. Subsequent encounter. EXAM: ULTRASOUND ABDOMEN LIMITED RIGHT UPPER QUADRANT COMPARISON:  11/28/2017 CT. FINDINGS: Gallbladder: Mobile gallstones measuring up to 1.4 cm. Gallbladder wall thickening measuring up to 3.7  mm. No tenderness over the gallbladder during scanning per sonographer. Common bile duct: Diameter: 4.3 mm Liver: Diffuse heterogeneity with nodular contour consistent with history of cirrhosis without focal mass noted. Portal vein is patent on color Doppler imaging with normal direction of blood flow towards the liver. Small amount of ascites. IMPRESSION: Cirrhotic liver without focal liver lesion. Portal vein appears patent. Small amount of ascites. Mobile gallstones measuring up to 1.4 cm. Mild gallbladder wall thickening may be related to hepatic disease rather than acute cholecystitis as the patient was not tender over the gallbladder during scanning. Electronically Signed   By: Genia Del M.D.   On: 03/07/2018 12:46   Lab Results  Component Value Date   WBC 4.3 03/01/2018   HGB 11.0 (L) 03/01/2018   HCT 32.9 (L) 03/01/2018   MCV 85.9 03/01/2018   PLT 41.0 cL (LL) 03/01/2018     Lab Results  Component Value Date   ALT 24 03/08/2018   AST 45 (H) 03/08/2018   ALKPHOS 234 (H) 03/08/2018   BILITOT 2.0 (H) 03/08/2018      Physical Exam: BP 130/72   Pulse 91   Ht 5' 7"  (1.702 m)   Wt 135 lb (61.2 kg)   SpO2 98%   BMI 21.14 kg/m  Constitutional: Pleasant, female in no acute  distress. HEENT: Normocephalic and atraumatic. Conjunctivae are normal.  Neck supple.  Cardiovascular: Normal rate, regular rhythm.  Pulmonary/chest: Effort normal and breath sounds normal. No wheezing, rales or rhonchi. Abdominal: Soft, mildly distended, nontender. There are no masses palpable.  Extremities: no edema, cast on left ankle, left elbow banded - skin abrasion noted with bruising Lymphadenopathy: No cervical adenopathy noted. Neurological: Alert and oriented to person place and time. No asterixis Skin: Skin is warm and dry. No rashes noted. Psychiatric: Normal mood and affect. Behavior is normal.   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 She has now been plugged in with the liver transplant service in town and going through the necessary steps to make her eligible for transplant. She's had some recent issues with hypokalemia on her diuretics, we'll recheck her kidney function today and adjust diuretics as needed. Her encephalopathy appears well-controlled on her present regimen. She is abstaining from alcohol. She is due for EGD with banding of varices if present, as well as colonoscopy. She is currently taking Doptelet in preparation for this given her thrombocytopenia. We have held off on nonselective beta blockade for treatment of varices given her volume issues / ascites in recent months.  Recommend the following: - CMET and INR to be done today - EGD with banding of varices / colonoscopy at the hospital next week. She has taken Doptelet to increase platelet count, will check CBC the morning of her procedure. I discussed the risk and benefits of endoscopy and anesthesia with her and she wants to proceed. - Continue lactulose and rifaximin, as well as Zinc, branch chain amino acids, LOLA - continue HCC screening every 6 months -  continue to follow up with Hepatology for transplant evaluation  Patient and husband in agreement with plan.   Racine Cellar, MD [facility] Gastroenterology

## 2018-03-09 ENCOUNTER — Encounter (HOSPITAL_COMMUNITY): Payer: Self-pay

## 2018-03-09 ENCOUNTER — Other Ambulatory Visit: Payer: Self-pay

## 2018-03-09 DIAGNOSIS — K7460 Unspecified cirrhosis of liver: Secondary | ICD-10-CM

## 2018-03-09 DIAGNOSIS — E876 Hypokalemia: Secondary | ICD-10-CM

## 2018-03-09 DIAGNOSIS — R188 Other ascites: Secondary | ICD-10-CM

## 2018-03-11 ENCOUNTER — Other Ambulatory Visit: Payer: Self-pay

## 2018-03-11 ENCOUNTER — Telehealth: Payer: Self-pay | Admitting: Gastroenterology

## 2018-03-11 DIAGNOSIS — R188 Other ascites: Secondary | ICD-10-CM

## 2018-03-11 DIAGNOSIS — K7460 Unspecified cirrhosis of liver: Secondary | ICD-10-CM

## 2018-03-11 NOTE — Telephone Encounter (Signed)
Routed to Dr. Havery Moros, please advise. I know you just adjusted her diuretics.

## 2018-03-11 NOTE — Telephone Encounter (Signed)
Has she resumed the low dose lasix yet? If she feels that tight and wants the paracentesis we can refer her to IR while we are adjusting her diuretics. Recommend cell count with this to rule out SBP. Thanks

## 2018-03-11 NOTE — Telephone Encounter (Signed)
Patient states her stomach has filled up with fluid and wants to know if she should have a paracentesis done before her procedure next week.

## 2018-03-11 NOTE — Telephone Encounter (Signed)
Perfect thanks

## 2018-03-11 NOTE — Telephone Encounter (Signed)
Patient scheduled, orders placed.

## 2018-03-11 NOTE — Telephone Encounter (Signed)
I was going to put a max dose to be removed of 7 liters, Albumin 6-8 grams/liter removed >5 liters and a cell count in as the order, is this correct?

## 2018-03-14 ENCOUNTER — Ambulatory Visit (HOSPITAL_COMMUNITY)
Admission: RE | Admit: 2018-03-14 | Discharge: 2018-03-14 | Disposition: A | Discharge status: Home / Self Care | Payer: Medicare HMO | Source: Ambulatory Visit | Attending: Gastroenterology | Admitting: Gastroenterology

## 2018-03-14 ENCOUNTER — Encounter (HOSPITAL_COMMUNITY): Payer: Self-pay | Admitting: Student

## 2018-03-14 DIAGNOSIS — R188 Other ascites: Secondary | ICD-10-CM | POA: Diagnosis not present

## 2018-03-14 DIAGNOSIS — S82875D Nondisplaced pilon fracture of left tibia, subsequent encounter for closed fracture with routine healing: Secondary | ICD-10-CM | POA: Diagnosis not present

## 2018-03-14 DIAGNOSIS — K7460 Unspecified cirrhosis of liver: Secondary | ICD-10-CM

## 2018-03-14 HISTORY — PX: IR PARACENTESIS: IMG2679

## 2018-03-14 LAB — BODY FLUID CELL COUNT WITH DIFFERENTIAL
Eos, Fluid: NONE SEEN %
Lymphs, Fluid: 20 %
Monocyte-Macrophage-Serous Fluid: 62 % (ref 50–90)
Neutrophil Count, Fluid: 18 % (ref 0–25)
Total Nucleated Cell Count, Fluid: 156 cu mm (ref 0–1000)

## 2018-03-14 MED ORDER — LIDOCAINE HCL (PF) 1 % IJ SOLN
INTRAMUSCULAR | Status: DC | PRN
  Administered 2018-03-14: 10 mL

## 2018-03-14 MED ORDER — LIDOCAINE HCL (PF) 2 % IJ SOLN
INTRAMUSCULAR | Status: AC
  Filled 2018-03-14: qty 20

## 2018-03-14 NOTE — Procedures (Signed)
PROCEDURE SUMMARY:  Successful US guided paracentesis from left lateral abdomen.  Yielded 2.9 liters of pale, yellow fluid.  No immediate complications.  Pt tolerated well.   Specimen was sent for labs.  Docia Barrier PA-C 03/14/2018 11:00 AM

## 2018-03-15 ENCOUNTER — Ambulatory Visit (HOSPITAL_COMMUNITY): Payer: Medicare HMO | Admitting: Anesthesiology

## 2018-03-15 ENCOUNTER — Telehealth: Payer: Self-pay | Admitting: Gastroenterology

## 2018-03-15 ENCOUNTER — Encounter (HOSPITAL_COMMUNITY): Payer: Self-pay | Admitting: *Deleted

## 2018-03-15 ENCOUNTER — Other Ambulatory Visit: Payer: Self-pay

## 2018-03-15 ENCOUNTER — Encounter (HOSPITAL_COMMUNITY)
Admission: RE | Disposition: A | Discharge status: Home / Self Care | Payer: Self-pay | Source: Ambulatory Visit | Attending: Gastroenterology

## 2018-03-15 ENCOUNTER — Ambulatory Visit (HOSPITAL_COMMUNITY)
Admission: RE | Admit: 2018-03-15 | Discharge: 2018-03-15 | Disposition: A | Discharge status: Home / Self Care | Payer: Medicare HMO | Source: Ambulatory Visit | Attending: Gastroenterology | Admitting: Gastroenterology

## 2018-03-15 DIAGNOSIS — Z880 Allergy status to penicillin: Secondary | ICD-10-CM | POA: Insufficient documentation

## 2018-03-15 DIAGNOSIS — Q438 Other specified congenital malformations of intestine: Secondary | ICD-10-CM | POA: Diagnosis not present

## 2018-03-15 DIAGNOSIS — Z1211 Encounter for screening for malignant neoplasm of colon: Secondary | ICD-10-CM | POA: Diagnosis not present

## 2018-03-15 DIAGNOSIS — E039 Hypothyroidism, unspecified: Secondary | ICD-10-CM | POA: Insufficient documentation

## 2018-03-15 DIAGNOSIS — K7460 Unspecified cirrhosis of liver: Secondary | ICD-10-CM

## 2018-03-15 DIAGNOSIS — K648 Other hemorrhoids: Secondary | ICD-10-CM

## 2018-03-15 DIAGNOSIS — R8589 Other abnormal findings in specimens from digestive organs and abdominal cavity: Secondary | ICD-10-CM | POA: Diagnosis not present

## 2018-03-15 DIAGNOSIS — Z881 Allergy status to other antibiotic agents status: Secondary | ICD-10-CM | POA: Diagnosis not present

## 2018-03-15 DIAGNOSIS — K6389 Other specified diseases of intestine: Secondary | ICD-10-CM

## 2018-03-15 DIAGNOSIS — Z79890 Hormone replacement therapy (postmenopausal): Secondary | ICD-10-CM | POA: Insufficient documentation

## 2018-03-15 DIAGNOSIS — Z79899 Other long term (current) drug therapy: Secondary | ICD-10-CM | POA: Diagnosis not present

## 2018-03-15 DIAGNOSIS — I8500 Esophageal varices without bleeding: Secondary | ICD-10-CM | POA: Diagnosis not present

## 2018-03-15 DIAGNOSIS — K259 Gastric ulcer, unspecified as acute or chronic, without hemorrhage or perforation: Secondary | ICD-10-CM | POA: Insufficient documentation

## 2018-03-15 DIAGNOSIS — I8510 Secondary esophageal varices without bleeding: Secondary | ICD-10-CM | POA: Diagnosis not present

## 2018-03-15 DIAGNOSIS — Z7189 Other specified counseling: Secondary | ICD-10-CM

## 2018-03-15 DIAGNOSIS — Z886 Allergy status to analgesic agent status: Secondary | ICD-10-CM | POA: Insufficient documentation

## 2018-03-15 DIAGNOSIS — R188 Other ascites: Secondary | ICD-10-CM

## 2018-03-15 HISTORY — PX: BIOPSY: SHX5522

## 2018-03-15 HISTORY — PX: COLONOSCOPY WITH PROPOFOL: SHX5780

## 2018-03-15 HISTORY — PX: ESOPHAGOGASTRODUODENOSCOPY (EGD) WITH PROPOFOL: SHX5813

## 2018-03-15 LAB — CBC WITH DIFFERENTIAL/PLATELET
Basophils Absolute: 0 10*3/uL (ref 0.0–0.1)
Basophils Relative: 1 %
Eosinophils Absolute: 0.2 10*3/uL (ref 0.0–0.7)
Eosinophils Relative: 3 %
HCT: 35.8 % — ABNORMAL LOW (ref 36.0–46.0)
Hemoglobin: 11.4 g/dL — ABNORMAL LOW (ref 12.0–15.0)
Lymphocytes Relative: 12 %
Lymphs Abs: 0.6 10*3/uL — ABNORMAL LOW (ref 0.7–4.0)
MCH: 27.9 pg (ref 26.0–34.0)
MCHC: 31.8 g/dL (ref 30.0–36.0)
MCV: 87.7 fL (ref 78.0–100.0)
Monocytes Absolute: 0.6 10*3/uL (ref 0.1–1.0)
Monocytes Relative: 13 %
Neutro Abs: 3.4 10*3/uL (ref 1.7–7.7)
Neutrophils Relative %: 71 %
Platelets: 48 10*3/uL — ABNORMAL LOW (ref 150–400)
RBC: 4.08 MIL/uL (ref 3.87–5.11)
RDW: 15.6 % — ABNORMAL HIGH (ref 11.5–15.5)
WBC: 4.8 10*3/uL (ref 4.0–10.5)

## 2018-03-15 LAB — BASIC METABOLIC PANEL
Anion gap: 13 (ref 5–15)
BUN: 6 mg/dL (ref 6–20)
CO2: 21 mmol/L — ABNORMAL LOW (ref 22–32)
Calcium: 8.5 mg/dL — ABNORMAL LOW (ref 8.9–10.3)
Chloride: 105 mmol/L (ref 101–111)
Creatinine, Ser: 0.58 mg/dL (ref 0.44–1.00)
GFR calc Af Amer: >60 mL/min (ref >60)
GFR calc non Af Amer: >60 mL/min (ref >60)
Glucose, Bld: 111 mg/dL — ABNORMAL HIGH (ref 65–99)
Potassium: 2.8 mmol/L — ABNORMAL LOW (ref 3.5–5.1)
Sodium: 139 mmol/L (ref 135–145)

## 2018-03-15 LAB — PATHOLOGIST SMEAR REVIEW

## 2018-03-15 SURGERY — COLONOSCOPY WITH PROPOFOL
Anesthesia: Monitor Anesthesia Care

## 2018-03-15 MED ORDER — SODIUM CHLORIDE 0.9 % IV SOLN: INTRAVENOUS | Status: DC

## 2018-03-15 MED ORDER — LIDOCAINE 2% (20 MG/ML) 5 ML SYRINGE
INTRAMUSCULAR | Status: DC | PRN
  Administered 2018-03-15: 60 mg via INTRAVENOUS

## 2018-03-15 MED ORDER — PROPOFOL 10 MG/ML IV BOLUS
INTRAVENOUS | Status: AC
  Filled 2018-03-15: qty 20

## 2018-03-15 MED ORDER — LACTATED RINGERS IV SOLN
INTRAVENOUS | Status: DC
  Administered 2018-03-15: 09:00:00 via INTRAVENOUS

## 2018-03-15 MED ORDER — PROPOFOL 500 MG/50ML IV EMUL
INTRAVENOUS | Status: DC | PRN
  Administered 2018-03-15: 150 ug/kg/min via INTRAVENOUS

## 2018-03-15 MED ORDER — OMEPRAZOLE MAGNESIUM 20 MG PO TBEC: 20.0000 mg | DELAYED_RELEASE_TABLET | Freq: Two times a day (BID) | ORAL | 3 refills | Status: DC

## 2018-03-15 MED ORDER — POTASSIUM CHLORIDE ER 10 MEQ PO TBCR: 20.0000 meq | EXTENDED_RELEASE_TABLET | Freq: Two times a day (BID) | ORAL | 1 refills | Status: DC

## 2018-03-15 MED ORDER — PROPOFOL 10 MG/ML IV BOLUS
INTRAVENOUS | Status: AC
  Filled 2018-03-15: qty 40

## 2018-03-15 MED ORDER — PHENYLEPHRINE 40 MCG/ML (10ML) SYRINGE FOR IV PUSH (FOR BLOOD PRESSURE SUPPORT)
PREFILLED_SYRINGE | INTRAVENOUS | Status: DC | PRN
  Administered 2018-03-15: 80 ug via INTRAVENOUS

## 2018-03-15 MED ORDER — PROPOFOL 10 MG/ML IV BOLUS
INTRAVENOUS | Status: DC | PRN
  Administered 2018-03-15: 30 mg via INTRAVENOUS
  Administered 2018-03-15: 50 mg via INTRAVENOUS
  Administered 2018-03-15: 20 mg via INTRAVENOUS

## 2018-03-15 SURGICAL SUPPLY — 24 items

## 2018-03-15 NOTE — Telephone Encounter (Signed)
Patient had EGD and colonoscopy today.  She had only very small varices without any high risk stigmata for bleeding, no banding done (her platelet count remained 48 despite Doptelet). She's having worsening ascites since stopping lasix. Despite holding lasix her K actually dropped to 2.8 today. Will increase aldactone to 324m q AM, continue to hold lasix, and give her some KDur to take BID with plans to repeat BMET on Friday. If she cannot tolerate diuretics due to continued electrolyte issues moving forward we may need to consider a TIPS. I will reach out to her Hepatologist to see if they think she is a candidate for this. She agreed.

## 2018-03-15 NOTE — Anesthesia Preprocedure Evaluation (Signed)
Anesthesia Evaluation  Patient identified by MRN, date of birth, ID band Patient awake    Reviewed: Allergy & Precautions, NPO status , Patient's Chart, lab work & pertinent test results  Airway Mallampati: I  TM Distance: >3 FB Neck ROM: Full    Dental  (+) Edentulous Upper   Pulmonary neg pulmonary ROS,    breath sounds clear to auscultation       Cardiovascular negative cardio ROS   Rhythm:Regular Rate:Normal     Neuro/Psych    GI/Hepatic (+) Cirrhosis       ,   Endo/Other  Hypothyroidism   Renal/GU      Musculoskeletal   Abdominal   Peds  Hematology  (+) anemia ,   Anesthesia Other Findings   Reproductive/Obstetrics                             Anesthesia Physical  Anesthesia Plan  ASA: III  Anesthesia Plan: MAC   Post-op Pain Management:    Induction: Intravenous  PONV Risk Score and Plan: 3 and Ondansetron, Dexamethasone, Treatment may vary due to age or medical condition and Propofol infusion  Airway Management Planned:   Additional Equipment:   Intra-op Plan:   Post-operative Plan:   Informed Consent: I have reviewed the patients History and Physical, chart, labs and discussed the procedure including the risks, benefits and alternatives for the proposed anesthesia with the patient or authorized representative who has indicated his/her understanding and acceptance.   Dental advisory given  Plan Discussed with: CRNA  Anesthesia Plan Comments:         Anesthesia Quick Evaluation

## 2018-03-15 NOTE — Op Note (Signed)
[facility] Patient Name: Megan Roberson Procedure Date: 03/15/2018 MRN: [id number] Attending MD: Carlota Raspberry. Armbruster MD, MD Date of Birth: 01-19-1956 CSN: [id number] Age: 62 Admit Type: Inpatient Procedure:                Colonoscopy Indications:              Screening for colorectal malignant neoplasm Providers:                Remo Lipps P. Armbruster MD, MD, Zenon Mayo, RN, Tinnie Gens, Technician Referring MD:              Medicines:                Monitored Anesthesia Care Complications:            No immediate complications. Estimated blood loss:                            None. Estimated Blood Loss:     Estimated blood loss: none. Procedure:                Pre-Anesthesia Assessment:                           - Prior to the procedure, a History and Physical                            was performed, and patient medications and                            allergies were reviewed. The patient's tolerance of                            previous anesthesia was also reviewed. The risks                            and benefits of the procedure and the sedation                            options and risks were discussed with the patient.                            All questions were answered, and informed consent                            was obtained. Prior Anticoagulants: The patient has                            taken no previous anticoagulant or antiplatelet                            agents. ASA Grade Assessment: III - A patient with  severe systemic disease. After reviewing the risks                            and benefits, the patient was deemed in                            satisfactory condition to undergo the procedure.                           After obtaining informed consent, the colonoscope                            was passed under direct vision. Throughout the                            procedure, the  patient's blood pressure, pulse, and                            oxygen saturations were monitored continuously. The                            EC-[id number] ([id number]) scope was introduced through                            the anus and advanced to the the ascending colon.                            The colonoscopy was technically difficult and                            complex due to a redundant colon with significant                            edema / looping. The patient tolerated the                            procedure well. The quality of the bowel                            preparation was adequate. The rectum was                            photographed. Scope In: 10:29:04 AM Scope Out: 11:07:08 AM Total Procedure Duration: 0 hours 38 minutes 4 seconds  Findings:      The perianal and digital rectal examinations were normal.      The colon was redundant with severe looping.      The entire colon was severely congested / edematous, consistent with       changes due to portal hypertension. Most severe in the sigmoid colon       which made visualization difficut. Due to severe looping / redundancy /       edema, initial attempt at cecal intubation with pediatric colonoscope       was not succesfull. The scope was exchanged for an adult colonoscope.  The suspected ascending colon was reached but cecal intubation was not       successful despite multiple attempts / abdominal pressure.      Internal hemorrhoids were found during retroflexion.      The exam was otherwise without abnormality. No polyps appreciated. Impression:               - Redundant colon with significant looping.                           - Severely congested / edematous mucosa in the                            entire examined colon due to portal hypertension.                            This made completion of this exam quite challenging                            and limited visualization in some areas, worse in                             sigmoid colon.                           - Internal hemorrhoids.                           - No polyps seen Moderate Sedation:      No moderate sedation, case performed with MAC Recommendation:           - Patient has a contact number available for                            emergencies. The signs and symptoms of potential                            delayed complications were discussed with the                            patient. Return to normal activities tomorrow.                            Written discharge instructions were provided to the                            patient.                           - Resume previous diet.                           - Continue present medications.                           - Will discuss options with patient, may consider  virtual colonoscopy to clear right colon Procedure Code(s):        --- Professional ---                           [phone], 53, Colonoscopy, flexible; diagnostic,                            including collection of specimen(s) by brushing or                            washing, when performed (separate procedure) Diagnosis Code(s):        --- Professional ---                           Z12.11, Encounter for screening for malignant                            neoplasm of colon                           K63.89, Other specified diseases of intestine                           K64.8, Other hemorrhoids                           Q43.8, Other specified congenital malformations of                            intestine CPT copyright 2017 American Medical Association. All rights reserved. The codes documented in this report are preliminary and upon coder review may  be revised to meet current compliance requirements. Remo Lipps P. Armbruster MD, MD 03/15/2018 11:16:37 AM This report has been signed electronically. Number of Addenda: 0

## 2018-03-15 NOTE — Anesthesia Postprocedure Evaluation (Signed)
Anesthesia Post Note  Patient: Megan Roberson  Procedure(s) Performed: COLONOSCOPY WITH PROPOFOL (N/A ) ESOPHAGOGASTRODUODENOSCOPY (EGD) WITH PROPOFOL (N/A )     Patient location during evaluation: PACU Anesthesia Type: MAC Level of consciousness: awake and alert Pain management: pain level controlled Vital Signs Assessment: post-procedure vital signs reviewed and stable Respiratory status: spontaneous breathing Cardiovascular status: stable Anesthetic complications: no    Last Vitals:  Vitals:   03/15/18 1120 03/15/18 1130  BP: (!) 156/80 (!) 160/86  Pulse: 79 79  Resp: 17 19  Temp:    SpO2: 98% 97%    Last Pain:  Vitals:   03/15/18 1130  TempSrc:   PainSc: 0-No pain                 Nolon Nations

## 2018-03-15 NOTE — Interval H&P Note (Signed)
History and Physical Interval Note: Checking CBC prior to procedure to ensure good rise in platelet count. Also rechecking BMET  03/15/2018 9:17 AM  Megan Roberson  has presented today for surgery, with the diagnosis of cirrhosis  The various methods of treatment have been discussed with the patient and family. After consideration of risks, benefits and other options for treatment, the patient has consented to  Procedure(s): COLONOSCOPY WITH PROPOFOL (N/A) ESOPHAGOGASTRODUODENOSCOPY (EGD) WITH PROPOFOL (N/A) ESOPHAGEAL BANDING (N/A) as a surgical intervention .  The patient's history has been reviewed, patient examined, no change in status, stable for surgery.  I have reviewed the patient's chart and labs.  Questions were answered to the patient's satisfaction.     [place]

## 2018-03-15 NOTE — Discharge Instructions (Signed)
Colonoscopy, Adult, Care After This sheet gives you information about how to care for yourself after your procedure. Your doctor may also give you more specific instructions. If you have problems or questions, call your doctor. Follow these instructions at home: General instructions   For the first 24 hours after the procedure: ? Do not drive or use machinery. ? Do not sign important documents. ? Do not drink alcohol. ? Do your daily activities more slowly than normal. ? Eat foods that are soft and easy to digest. ? Rest often.  Take over-the-counter or prescription medicines only as told by your doctor.  It is up to you to get the results of your procedure. Ask your doctor, or the department performing the procedure, when your results will be ready. To help cramping and bloating:  Try walking around.  Put heat on your belly (abdomen) as told by your doctor. Use a heat source that your doctor recommends, such as a moist heat pack or a heating pad. ? Put a towel between your skin and the heat source. ? Leave the heat on for 20-30 minutes. ? Remove the heat if your skin turns bright red. This is especially important if you cannot feel pain, heat, or cold. You can get burned. Eating and drinking  Drink enough fluid to keep your pee (urine) clear or pale yellow.  Return to your normal diet as told by your doctor. Avoid heavy or fried foods that are hard to digest.  Avoid drinking alcohol for as long as told by your doctor. Contact a doctor if:  You have blood in your poop (stool) 2-3 days after the procedure. Get help right away if:  You have more than a small amount of blood in your poop.  You see large clumps of tissue (blood clots) in your poop.  Your belly is swollen.  You feel sick to your stomach (nauseous).  You throw up (vomit).  You have a fever.  You have belly pain that gets worse, and medicine does not help your pain. This information is not intended to  replace advice given to you by your health care provider. Make sure you discuss any questions you have with your health care provider. Document Released: 11/21/2010 Document Revised: 07/13/2016 Document Reviewed: 07/13/2016 Elsevier Interactive Patient Education  2017 [employer]. Esophagogastroduodenoscopy, Care After Refer to this sheet in the next few weeks. These instructions provide you with information about caring for yourself after your procedure. Your health care provider may also give you more specific instructions. Your treatment has been planned according to current medical practices, but problems sometimes occur. Call your health care provider if you have any problems or questions after your procedure. What can I expect after the procedure? After the procedure, it is common to have:  A sore throat.  Nausea.  Bloating.  Dizziness.  Fatigue.  Follow these instructions at home:  Do not eat or drink anything until the numbing medicine (local anesthetic) has worn off and your gag reflex has returned. You will know that the local anesthetic has worn off when you can swallow comfortably.  Do not drive for 24 hours if you received a medicine to help you relax (sedative).  If your health care provider took a tissue sample for testing during the procedure, make sure to get your test results. This is your responsibility. Ask your health care provider or the department performing the test when your results will be ready.  Keep all follow-up visits as told  by your health care provider. This is important. Contact a health care provider if:  You cannot stop coughing.  You are not urinating.  You are urinating less than usual. Get help right away if:  You have trouble swallowing.  You cannot eat or drink.  You have throat or chest pain that gets worse.  You are dizzy or light-headed.  You faint.  You have nausea or vomiting.  You have chills.  You have a fever.  You  have severe abdominal pain.  You have black, tarry, or bloody stools. This information is not intended to replace advice given to you by your health care provider. Make sure you discuss any questions you have with your health care provider. Document Released: 10/05/2012 Document Revised: 03/26/2016 Document Reviewed: 09/12/2015 Elsevier Interactive Patient Education  [employer].

## 2018-03-15 NOTE — Transfer of Care (Signed)
Immediate Anesthesia Transfer of Care Note  Patient: Megan Roberson  Procedure(s) Performed: COLONOSCOPY WITH PROPOFOL (N/A ) ESOPHAGOGASTRODUODENOSCOPY (EGD) WITH PROPOFOL (N/A ) ESOPHAGEAL BANDING (N/A )  Patient Location: Endoscopy Unit  Anesthesia Type:MAC  Level of Consciousness: drowsy  Airway & Oxygen Therapy: Patient Spontanous Breathing and Patient connected to face mask oxygen  Post-op Assessment: Report given to RN, Post -op Vital signs reviewed and stable and Patient moving all extremities  Post vital signs: Reviewed and stable  Last Vitals:  Vitals Value Taken Time  BP 135/70 03/15/2018 11:14 AM  Temp    Pulse 79 03/15/2018 11:15 AM  Resp 18 03/15/2018 11:15 AM  SpO2 100 % 03/15/2018 11:15 AM  Vitals shown include unvalidated device data.  Last Pain:  Vitals:   03/15/18 0920  TempSrc: Oral  PainSc: 3          Complications: No apparent anesthesia complications

## 2018-03-15 NOTE — Op Note (Signed)
[facility] Patient Name: Megan Roberson Procedure Date: 03/15/2018 MRN: [id number] Attending MD: Carlota Raspberry. Armbruster MD, MD Date of Birth: May 06, 1956 CSN: [id number] Age: 62 Admit Type: Inpatient Procedure:                Upper GI endoscopy Indications:              Cirrhosis, history of bleeding esophageal varices.                            On Doptelet prior to procedure, platelets rose to 48 Providers:                Carlota Raspberry. Armbruster MD, MD, Zenon Mayo, RN, Tinnie Gens, Technician Referring MD:              Medicines:                Monitored Anesthesia Care Complications:            No immediate complications. Estimated blood loss:                            Minimal. Estimated Blood Loss:     Estimated blood loss was minimal. Procedure:                Pre-Anesthesia Assessment:                           - Prior to the procedure, a History and Physical                            was performed, and patient medications and                            allergies were reviewed. The patient's tolerance of                            previous anesthesia was also reviewed. The risks                            and benefits of the procedure and the sedation                            options and risks were discussed with the patient.                            All questions were answered, and informed consent                            was obtained. Prior Anticoagulants: The patient has                            taken no previous anticoagulant or antiplatelet  agents. ASA Grade Assessment: III - A patient with                            severe systemic disease. After reviewing the risks                            and benefits, the patient was deemed in                            satisfactory condition to undergo the procedure.                           After obtaining informed consent, the endoscope was                  passed under direct vision. Throughout the                            procedure, the patient's blood pressure, pulse, and                            oxygen saturations were monitored continuously. The                            Endoscope was introduced through the mouth, and                            advanced to the second part of duodenum. The upper                            GI endoscopy was accomplished without difficulty.                            The patient tolerated the procedure well. Scope In: Scope Out: Findings:      Esophagogastric landmarks were identified: the Z-line was found at 35       cm, the gastroesophageal junction was found at 35 cm and the upper       extent of the gastric folds was found at 35 cm from the incisors.      Small to trace varices were found in the middle third of the esophagus       and in the lower third of the esophagus, with no high risk stigmata for       bleeding.      Multiple small white plaques were found in the entire esophagus.       Brushings for candidiasis were obtained in the entire esophagus.      The exam of the esophagus was otherwise normal.      One non-bleeding superficial gastric ulcer with no stigmata of bleeding       was found in the gastric antrum. The lesion was 3 mm in largest       dimension.      Patchy mild inflammation characterized by erythema and friability was       found in the gastric body and in the gastric antrum. Biopsies were taken       with a cold forceps for Helicobacter  pylori testing.      The exam of the stomach was otherwise normal. No gastric varices      The duodenal bulb and second portion of the duodenum were normal. Impression:               - Esophagogastric landmarks identified.                           - Trace to small esophageal varices without high                            risk stigmata - banding not performed in light of                            small size and  thrombocytopenia.                           - Multiple white plaques in the esophagus.                            Brushings performed to rule out candidiasis.                           - Non-bleeding small gastric ulcer with no stigmata                            of bleeding.                           - Gastritis. Biopsied to rule out H pylori                           - Normal duodenal bulb and second portion of the                            duodenum. Moderate Sedation:      No moderate sedation, case performed with MAC Recommendation:           - Patient has a contact number available for                            emergencies. The signs and symptoms of potential                            delayed complications were discussed with the                            patient. Return to normal activities tomorrow.                            Written discharge instructions were provided to the                            patient.                           -  Resume previous diet.                           - Continue present medications.                           - Start omeprazole or protonix 57m once daily                           - Await pathology results.                           - Will discuss potential consideration for TIPS                            with Hepatology given worsening ascites /                            hypokalemia on diuretics Procedure Code(s):        --- Professional ---                           [id number] Esophagogastroduodenoscopy, flexible,                            transoral; with biopsy, single or multiple Diagnosis Code(s):        --- Professional ---                           K74.60, Unspecified cirrhosis of liver                           I85.10, Secondary esophageal varices without                            bleeding                           K22.8, Other specified diseases of esophagus                           K25.9, Gastric ulcer, unspecified as acute or                             chronic, without hemorrhage or perforation                           K29.70, Gastritis, unspecified, without bleeding CPT copyright 2017 American Medical Association. All rights reserved. The codes documented in this report are preliminary and upon coder review may  be revised to meet current compliance requirements. SRemo LippsP. Armbruster MD, MD 03/15/2018 11:24:11 AM This report has been signed electronically. Number of Addenda: 0

## 2018-03-16 ENCOUNTER — Encounter (HOSPITAL_COMMUNITY): Payer: Self-pay | Admitting: Gastroenterology

## 2018-03-17 ENCOUNTER — Other Ambulatory Visit: Payer: Self-pay

## 2018-03-18 ENCOUNTER — Other Ambulatory Visit: Payer: Self-pay

## 2018-03-18 ENCOUNTER — Telehealth: Payer: Self-pay

## 2018-03-18 DIAGNOSIS — E039 Hypothyroidism, unspecified: Secondary | ICD-10-CM | POA: Diagnosis not present

## 2018-03-18 DIAGNOSIS — E782 Mixed hyperlipidemia: Secondary | ICD-10-CM | POA: Diagnosis not present

## 2018-03-18 NOTE — Telephone Encounter (Signed)
Looked at labs and patient as of right now has not had her labs done. I called her to remind her to please come get them done today, Dr. Havery Moros will keep an eye out for them. Let her know that if she does not get them done, maybe can go to an urgent care within our system or if hospital has an outpatient lab but bottom line is she needs to get them rechecked. There is an order in the system for BMET.

## 2018-03-21 ENCOUNTER — Other Ambulatory Visit (INDEPENDENT_AMBULATORY_CARE_PROVIDER_SITE_OTHER): Payer: Medicare HMO

## 2018-03-21 DIAGNOSIS — E876 Hypokalemia: Secondary | ICD-10-CM

## 2018-03-21 DIAGNOSIS — R188 Other ascites: Secondary | ICD-10-CM | POA: Diagnosis not present

## 2018-03-21 DIAGNOSIS — K7460 Unspecified cirrhosis of liver: Secondary | ICD-10-CM | POA: Diagnosis not present

## 2018-03-21 DIAGNOSIS — G9009 Other idiopathic peripheral autonomic neuropathy: Secondary | ICD-10-CM | POA: Diagnosis not present

## 2018-03-21 DIAGNOSIS — F39 Unspecified mood [affective] disorder: Secondary | ICD-10-CM | POA: Diagnosis not present

## 2018-03-21 DIAGNOSIS — G4700 Insomnia, unspecified: Secondary | ICD-10-CM | POA: Diagnosis not present

## 2018-03-21 DIAGNOSIS — G8929 Other chronic pain: Secondary | ICD-10-CM | POA: Diagnosis not present

## 2018-03-21 DIAGNOSIS — R110 Nausea: Secondary | ICD-10-CM | POA: Diagnosis not present

## 2018-03-21 DIAGNOSIS — Z0000 Encounter for general adult medical examination without abnormal findings: Secondary | ICD-10-CM | POA: Diagnosis not present

## 2018-03-21 DIAGNOSIS — E039 Hypothyroidism, unspecified: Secondary | ICD-10-CM | POA: Diagnosis not present

## 2018-03-21 DIAGNOSIS — K5900 Constipation, unspecified: Secondary | ICD-10-CM | POA: Diagnosis not present

## 2018-03-21 LAB — BASIC METABOLIC PANEL
BUN: 4 mg/dL — ABNORMAL LOW (ref 6–23)
CO2: 26 mEq/L (ref 19–32)
Calcium: 8.8 mg/dL (ref 8.4–10.5)
Chloride: 105 mEq/L (ref 96–112)
Creatinine, Ser: 0.6 mg/dL (ref 0.40–1.20)
GFR: 108.57 mL/min (ref >60.00)
Glucose, Bld: 126 mg/dL — ABNORMAL HIGH (ref 70–99)
Potassium: 4.9 mEq/L (ref 3.5–5.1)
Sodium: 138 mEq/L (ref 135–145)

## 2018-03-21 NOTE — Telephone Encounter (Signed)
Left detailed message for patient to please go to lab and have them done this morning, this is very important so that any adjustments to her medications can safely be made.

## 2018-03-21 NOTE — Telephone Encounter (Signed)
Thanks Almyra Free. I don't see that Megan Roberson had her labs done. Can you ask if she can come to the lab today. It's very important she has this lab done so we can adjust her diuretics in a safe manner given her electrolyte abnormalities. Thanks

## 2018-03-22 ENCOUNTER — Telehealth: Payer: Self-pay | Admitting: Gastroenterology

## 2018-03-22 ENCOUNTER — Other Ambulatory Visit: Payer: Self-pay

## 2018-03-22 NOTE — Telephone Encounter (Signed)
I see where you tried to contact patient with the labs, she called and is wanting a paracentesis.

## 2018-03-22 NOTE — Telephone Encounter (Signed)
Spoke to patient, confirmed new plan for diuretics. She will try to relieve the fluid with medication first, will stop the K supplement and recheck her labs next Tuesday 5/28. Order placed for BMET. Patient also asked about getting some pain medication for her bone pain and abdomen pain. She has gotten this in the past from her PCP, directed her back to PCP.

## 2018-03-22 NOTE — Telephone Encounter (Signed)
Patient wants to know if she can get a paracentesis scheduled.

## 2018-03-22 NOTE — Telephone Encounter (Signed)
Thanks Almyra Free. Yes I called and left a message last night. Her potassium level is much better, now at high end of normal. On aldactone 327m daily with K supplementation. At this time I think she can stop K supplementation, and add back lasix 438monce daily to the aldactone 30015m day. Hopefully this help remove more fluid but keeps K at a reasonable level. Will need to repeat BMET in one week. She just had a paracentesis last week and they only took 2.9 L. I would like to try treating first with diuretics unless she is in distress, she has responded well to lasix in the past.  Can you see if she is okay with this plan? She must have the labs done as requested so we can adjust diuretics safely

## 2018-03-29 ENCOUNTER — Other Ambulatory Visit: Payer: Self-pay

## 2018-03-29 ENCOUNTER — Other Ambulatory Visit (INDEPENDENT_AMBULATORY_CARE_PROVIDER_SITE_OTHER): Payer: Medicare HMO

## 2018-03-29 DIAGNOSIS — K7460 Unspecified cirrhosis of liver: Secondary | ICD-10-CM

## 2018-03-29 DIAGNOSIS — R188 Other ascites: Secondary | ICD-10-CM | POA: Diagnosis not present

## 2018-03-29 LAB — BASIC METABOLIC PANEL
BUN: 13 mg/dL (ref 6–23)
CO2: 28 mEq/L (ref 19–32)
Calcium: 10.3 mg/dL (ref 8.4–10.5)
Chloride: 95 mEq/L — ABNORMAL LOW (ref 96–112)
Creatinine, Ser: 0.78 mg/dL (ref 0.40–1.20)
GFR: 80.2 mL/min (ref >60.00)
Glucose, Bld: 111 mg/dL — ABNORMAL HIGH (ref 70–99)
Potassium: 3.2 mEq/L — ABNORMAL LOW (ref 3.5–5.1)
Sodium: 137 mEq/L (ref 135–145)

## 2018-03-30 ENCOUNTER — Other Ambulatory Visit: Payer: Self-pay

## 2018-03-30 ENCOUNTER — Telehealth: Payer: Self-pay

## 2018-03-30 DIAGNOSIS — E876 Hypokalemia: Secondary | ICD-10-CM

## 2018-03-30 NOTE — Telephone Encounter (Signed)
Thanks Almyra Free for asking [facility] to take a look. Agree with recommendations by Dr. Ardis Hughs, will await labs in 2 weeks. I hope resuming the lasix has helped with her fluid balance / ascites and that no further paracentesis is needed.

## 2018-03-30 NOTE — Telephone Encounter (Signed)
Dr. Ardis Hughs, as DOD, I am routing this note to you for your review of patient's BMET done yesterday. This is one of Dr. Doyne Keel patients who as of 03/21/18 had stopped taking her potassium, currently on Lasix 40 mg daily and Aldactone 300 mg daily. Please advise if any changes need to be made, thank you for your help.

## 2018-03-30 NOTE — Telephone Encounter (Signed)
Let's get her back on potassium 78mq once daily, repeat BMET in 2 weeks.  Thanks

## 2018-03-30 NOTE — Telephone Encounter (Signed)
Spoke to patient and let her know that she will need to add back the potassium 10 meq daily, no changes to her diuretics at this time. She will come do a repeat BMET in two weeks. Let her know that Dr. Havery Moros will review these labs upon his return to the office next week.

## 2018-03-30 NOTE — Telephone Encounter (Signed)
Left message for patient to call back re: lab work and medication changes.

## 2018-05-20 ENCOUNTER — Emergency Department (HOSPITAL_COMMUNITY)
Admission: EM | Admit: 2018-05-20 | Discharge: 2018-05-20 | Disposition: A | Discharge status: Home / Self Care | Payer: Medicare HMO | Attending: Emergency Medicine | Admitting: Emergency Medicine

## 2018-05-20 ENCOUNTER — Other Ambulatory Visit: Payer: Self-pay

## 2018-05-20 ENCOUNTER — Emergency Department (HOSPITAL_COMMUNITY): Payer: Medicare HMO

## 2018-05-20 ENCOUNTER — Encounter (HOSPITAL_COMMUNITY): Payer: Self-pay | Admitting: Emergency Medicine

## 2018-05-20 DIAGNOSIS — S82142A Displaced bicondylar fracture of left tibia, initial encounter for closed fracture: Secondary | ICD-10-CM | POA: Diagnosis not present

## 2018-05-20 DIAGNOSIS — S60812A Abrasion of left wrist, initial encounter: Secondary | ICD-10-CM | POA: Diagnosis not present

## 2018-05-20 DIAGNOSIS — Y999 Unspecified external cause status: Secondary | ICD-10-CM | POA: Insufficient documentation

## 2018-05-20 DIAGNOSIS — Z79899 Other long term (current) drug therapy: Secondary | ICD-10-CM | POA: Insufficient documentation

## 2018-05-20 DIAGNOSIS — B070 Plantar wart: Secondary | ICD-10-CM | POA: Insufficient documentation

## 2018-05-20 DIAGNOSIS — E039 Hypothyroidism, unspecified: Secondary | ICD-10-CM | POA: Insufficient documentation

## 2018-05-20 DIAGNOSIS — Y92013 Bedroom of single-family (private) house as the place of occurrence of the external cause: Secondary | ICD-10-CM | POA: Insufficient documentation

## 2018-05-20 DIAGNOSIS — S6991XA Unspecified injury of right wrist, hand and finger(s), initial encounter: Secondary | ICD-10-CM | POA: Diagnosis not present

## 2018-05-20 DIAGNOSIS — M25531 Pain in right wrist: Secondary | ICD-10-CM | POA: Diagnosis not present

## 2018-05-20 DIAGNOSIS — M25562 Pain in left knee: Secondary | ICD-10-CM | POA: Diagnosis not present

## 2018-05-20 DIAGNOSIS — Y9389 Activity, other specified: Secondary | ICD-10-CM | POA: Insufficient documentation

## 2018-05-20 DIAGNOSIS — S8992XA Unspecified injury of left lower leg, initial encounter: Secondary | ICD-10-CM | POA: Diagnosis not present

## 2018-05-20 DIAGNOSIS — W06XXXA Fall from bed, initial encounter: Secondary | ICD-10-CM | POA: Insufficient documentation

## 2018-05-20 DIAGNOSIS — S82125A Nondisplaced fracture of lateral condyle of left tibia, initial encounter for closed fracture: Secondary | ICD-10-CM | POA: Diagnosis not present

## 2018-05-20 MED ORDER — BACITRACIN ZINC 500 UNIT/GM EX OINT
TOPICAL_OINTMENT | CUTANEOUS | Status: AC
  Filled 2018-05-20: qty 0.9

## 2018-05-20 MED ORDER — OXYCODONE HCL 5 MG PO TABS: 5.0000 mg | ORAL_TABLET | ORAL | 0 refills | Status: DC | PRN

## 2018-05-20 MED ORDER — OXYCODONE HCL 5 MG PO TABS
5.0000 mg | ORAL_TABLET | Freq: Once | ORAL | Status: AC
  Administered 2018-05-20: 5 mg via ORAL
  Filled 2018-05-20: qty 1

## 2018-05-20 NOTE — ED Provider Notes (Signed)
[facility] EMERGENCY DEPARTMENT Provider Note   CSN: [id number] Arrival date & time: 05/20/18  1702     History   Chief Complaint Chief Complaint  Patient presents with  . Fall  . Knee Injury  . Hand Injury    HPI Megan Roberson is a 62 y.o. female with a past medical history of liver cirrhosis, hypothyroidism, osteoporosis with resultant h/o multiple fractures presenting with left knee and right wrist pain after falling when getting out of bed.  She fell backwards between her bed and nightstand early today.  Since the injury she has had increased swelling and and pain in the left knee with a popping sensation with movement.  She also reports a moderate bruise at her left wrist which is sore, but denies pain in her hand, fingers or elbow.  She also denies head injury, loc, n/v or other complaints from the fall. She has had a "knot" on her right plantar foot which has been present for months, feels like she is stepping on a pebble which alters her gait. She has had no evaluation of this prior to arrival.  The history is provided by the patient and the spouse.  Hand Injury   Pertinent negatives include no fever.    Past Medical History:  Diagnosis Date  . Anxiety    pt. denies  . Cirrhosis of liver ([place])   . Depression    pt. denies  . Heart murmur    "not concerned about "   . History of abdominal paracentesis   . History of blood transfusion   . Hypothyroidism   . Ileus ([place])   . Liver disease 2008  . Neuropathy   . Osteoporosis 02/09/2018  . Pancytopenia ([place])   . Pneumonia   . Vaginal Pap smear, abnormal     Patient Active Problem List   Diagnosis Date Noted  . 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 Osteoporosis 02/09/2018  . Colon cancer screening 01/18/2018  . Ascites 11/25/2017  . Cellulitis, abdominal wall 11/25/2017  . SOB (shortness of breath) 11/25/2017  . Protein-calorie malnutrition, severe 11/06/2017  . Ogilvie's syndrome   . Abdominal  distension   . Abdominal pain   . Liver failure without hepatic coma ([place])   . Thrombocytopenia ([place])   . Closed fracture of lumbar spine without lesion of spinal cord ([place])   . Cirrhosis of liver ([place]) 04/21/2017  . Anemia 04/21/2017  . History of open reduction and internal fixation (ORIF) procedure 04/21/2017  . Visit for suture removal 04/21/2017  . Left Olecranon fracture 04/21/2017  . Hypokalemia 04/21/2017    Past Surgical History:  Procedure Laterality Date  . AUGMENTATION MAMMAPLASTY Bilateral    20 years ago  . BIOPSY  03/15/2018   Procedure: BIOPSY;  Surgeon: Yetta Flock, MD;  Location: [facility];  Service: Gastroenterology;;  Gastric  . COLONOSCOPY WITH ESOPHAGOGASTRODUODENOSCOPY (EGD) AND ESOPHAGEAL DILATION (ED)    . COLONOSCOPY WITH PROPOFOL N/A 03/15/2018   Procedure: COLONOSCOPY WITH PROPOFOL;  Surgeon: Yetta Flock, MD;  Location: [facility];  Service: Gastroenterology;  Laterality: N/A;  . ESOPHAGOGASTRODUODENOSCOPY (EGD) WITH PROPOFOL N/A 03/15/2018   Procedure: ESOPHAGOGASTRODUODENOSCOPY (EGD) WITH PROPOFOL;  Surgeon: Yetta Flock, MD;  Location: [facility];  Service: Gastroenterology;  Laterality: N/A;  . ESOPHAGOGASTRODUODENOSCOPY W/ BANDING     Varices  . FRACTURE SURGERY Left    x 2 L elbow and hip left   . HARDWARE REMOVAL Left 09/17/2017   Procedure: HARDWARE REMOVAL  LEFT OLECRANON;  Surgeon: Altamese [place], MD;  Location: [facility];  Service: Orthopedics;  Laterality: Left;  . HIP SURGERY Left    from fracture- rod in palce  . IR PARACENTESIS  11/18/2017  . IR PARACENTESIS  11/26/2017  . IR PARACENTESIS  03/14/2018  . KNEE SURGERY Right    open incision for ligaments  . ORIF ELBOW FRACTURE Left 04/30/2017   Procedure: REVISION OPEN REDUCTION INTERNAL FIXATION (ORIF) ELBOW/OLECRANON FRACTURE;  Surgeon: Altamese Garfield, MD;  Location: [facility];  Service: Orthopedics;  Laterality: Left;     OB History    Gravida  2   Para  2    Term  2   Preterm      AB      Living  1     SAB      TAB      Ectopic      Multiple      Live Births  2            Home Medications    Prior to Admission medications   Medication Sig Start Date End Date Taking? Authorizing Provider  furosemide (LASIX) 40 MG tablet Take 1 tablet (40 mg total) by mouth 2 (two) times daily. 01/05/18   Armbruster, Carlota Raspberry, MD  gabapentin (NEURONTIN) 100 MG capsule Take 2 capsules (200 mg total) by mouth 3 (three) times daily. 11/09/17   Cherene Altes, MD  lactulose (CHRONULAC) 10 GM/15ML solution Take 30 mLs (20 g total) by mouth 3 (three) times daily. Patient taking differently: Take 20 g by mouth 2 (two) times daily.  12/01/17   Aline August, MD  levOCARNitine (CARNITOR) 330 MG tablet Take 330 mg by mouth 3 (three) times daily.    [provider]  levothyroxine (SYNTHROID, LEVOTHROID) 75 MCG tablet Take 1 tablet (75 mcg total) by mouth daily before breakfast. 12/02/17   Aline August, MD  omeprazole (PRILOSEC OTC) 20 MG tablet Take 1 tablet (20 mg total) by mouth 2 (two) times daily. 03/15/18   Armbruster, Carlota Raspberry, MD  ondansetron (ZOFRAN-ODT) 4 MG disintegrating tablet Take 4 mg by mouth every 8 (eight) hours as needed for nausea or vomiting.    [provider]  oxyCODONE (ROXICODONE) 5 MG immediate release tablet Take 1 tablet (5 mg total) by mouth every 4 (four) hours as needed for severe pain. 05/20/18   Evalee Jefferson, PA-C  potassium chloride (K-DUR) 10 MEQ tablet Take 2 tablets (20 mEq total) by mouth 2 (two) times daily. 03/15/18   Armbruster, Carlota Raspberry, MD  QUEtiapine (SEROQUEL) 25 MG tablet Take 25 mg by mouth at bedtime.     [provider]  rifaximin (XIFAXAN) 550 MG TABS tablet Take 1 tablet (550 mg total) by mouth 2 (two) times daily. 11/09/17   Cherene Altes, MD  sertraline (ZOLOFT) 100 MG tablet Take 100 mg by mouth daily.     [provider]  spironolactone (ALDACTONE) 100 MG tablet Take 2  tablets (200 mg total) by mouth daily. 12/02/17   Aline August, MD  traZODone (DESYREL) 50 MG tablet Take 50 mg at bedtime by mouth.    [provider]    Family History Family History  Adopted: Yes  Problem Relation Age of Onset  . Alcohol abuse Daughter     Social History Social History   Tobacco Use  . Smoking status: Never Smoker  . Smokeless tobacco: Never Used  Substance Use Topics  . Alcohol use: No  .  Drug use: No     Allergies   Penicillins; Tylenol [acetaminophen]; Sulfa antibiotics; and Tramadol   Review of Systems Review of Systems  Constitutional: Negative for fever.  Musculoskeletal: Positive for arthralgias and joint swelling. Negative for myalgias.  Skin: Positive for color change.  Neurological: Negative for dizziness, weakness, numbness and headaches.  Hematological: Bruises/bleeds easily.     Physical Exam Updated Vital Signs BP 130/71 (BP Location: Right Arm)   Pulse 81   Temp 98.5 F (36.9 C) (Oral)   Resp 18   Ht 5' 6"  (1.676 m)   Wt 61.2 kg (135 lb)   SpO2 98%   BMI 21.79 kg/m   Physical Exam  Constitutional: She is oriented to person, place, and time. She appears well-developed.  Thin appearing  HENT:  Head: Normocephalic and atraumatic.  Eyes: Conjunctivae are normal.  Neck: Normal range of motion.  Cardiovascular: Normal rate and intact distal pulses.  Pulses:      Radial pulses are 2+ on the right side, and 2+ on the left side.       Dorsalis pedis pulses are 2+ on the right side, and 2+ on the left side.  Pulmonary/Chest: Effort normal.  Abdominal: Soft. Bowel sounds are normal. She exhibits distension. There is no tenderness.  Mild distension   Musculoskeletal: Normal range of motion.       Right wrist: She exhibits swelling. She exhibits no deformity.       Left knee: She exhibits swelling and effusion. She exhibits no ecchymosis, no deformity, normal alignment, no LCL laxity and no MCL laxity. Tenderness  found. Medial joint line tenderness noted.  Mild soft tissue edema of the left anterior knee. No effusion.  Mild dorsal swelling right wrist. FROM of the wrist and fingers with no significant pain.  No proximal forearm or elbow pain.  Several bruises on the right forearm, in various stages of healing.  Neurological: She is alert and oriented to person, place, and time.  Skin: Skin is warm and dry.  Small plantars wart right plantar foot over 2nd metatarsal head.  Psychiatric: She has a normal mood and affect.  Nursing note and vitals reviewed.    ED Treatments / Results  Labs (all labs ordered are listed, but only abnormal results are displayed) Labs Reviewed - No data to display  EKG None  Radiology Dg Wrist Complete Right  Result Date: 05/20/2018 CLINICAL DATA:  Fall a few days ago.  Pain. EXAM: RIGHT WRIST - COMPLETE 3+ VIEW COMPARISON:  None. FINDINGS: No acute fracture deformity. No dislocation. Osteopenia without destructive bony lesions. Negative ulnar variance. Moderate radiocarpal joint space narrowing with periarticular spurring. Severe first metacarpal carpal joint space narrowing with periarticular sclerosis and marginal spurring; subcentimeter periarticular loose body. Distal fifth metacarpus small exostosis. Soft tissue swelling without subcutaneous gas or radiopaque foreign bodies. IMPRESSION: 1. No acute fracture deformity or dislocation. Osteopenia decreases sensitivity for acute nondisplaced fractures. 2. Severe first carpometacarpal osteoarthrosis.  Small loose body. Electronically Signed   By: Elon Alas M.D.   On: 05/20/2018 18:21   Ct Knee Left Wo Contrast  Result Date: 05/20/2018 CLINICAL DATA:  Fall, knee pain, query tibial plateau fracture. EXAM: CT OF THE left KNEE WITHOUT CONTRAST TECHNIQUE: Multidetector CT imaging of the left knee was performed according to the standard protocol. Multiplanar CT image reconstructions were also generated. COMPARISON:   Radiographs of 05/20/2018 FINDINGS: Bones/Joint/Cartilage 1.3 by 1.1 cm area of mild impaction anteriorly along the lateral tibial plateau, with  1 mm of impaction observed for example on image 76/6. This is probably best considered a Schatzker type 3 pure depression fracture. Chronicity is uncertain. There is also mild subcortical sclerosis and minimal concavity along a portion of the medial tibial plateau which is most likely chronic and degenerative. There is only a trace knee effusion.  No free fragment observed. Ligaments The contours of the cruciate ligaments are normal and I am skeptical of a complete cruciate ligament tear. Similarly the contours of the collateral ligaments appear relatively normal. Muscles and Tendons Unremarkable Soft tissues Unremarkable IMPRESSION: 1. There is about 1 mm of impaction along a 1.3 by 1.1 cm area of the anterior portion of the lateral tibial plateau. This is probably best considered a Schatzker type 3 pure depression fracture. Chronicity uncertain but there is a trace knee effusion. 2. Mild degenerative findings in the lateral compartment. Electronically Signed   By: Van Clines M.D.   On: 05/20/2018 19:49   Dg Knee Complete 4 Views Left  Result Date: 05/20/2018 CLINICAL DATA:  [place] a few days ago.  Wrist and knee pain. EXAM: LEFT KNEE - COMPLETE 4+ VIEW COMPARISON:  None. FINDINGS: Slight step-off tibial plateau only seen on lateral radiograph. No dislocation. Osteopenia. No destructive bony lesions. Soft tissue planes are non suspicious. IMPRESSION: Slight step-off tibial plateau equivocal for fracture seen on single view. Osteopenia, decreasing sensitivity for acute nondisplaced fractures. Electronically Signed   By: Elon Alas M.D.   On: 05/20/2018 18:16    Procedures Procedures (including critical care time)  Medications Ordered in ED Medications  oxyCODONE (Oxy IR/ROXICODONE) immediate release tablet 5 mg (5 mg Oral Given 05/20/18 1905)      Initial Impression / Assessment and Plan / ED Course  I have reviewed the triage vital signs and the nursing notes.  Pertinent labs & imaging results that were available during my care of the patient were reviewed by me and considered in my medical decision making (see chart for details).  Clinical Course as of May 22 1631  Fri May 20, 2018  2005 CT Knee Left Wo Contrast [JI]    Clinical Course User Index [JI] Evalee Jefferson, PA-C    Imaging reviewed and discussed with pt. Also discussed with Dr. Aline Brochure who endorses knee immobilizer, minimize but not complete non weight bearing.  F/u with him next week, pt agrees with and understands plan. Discussed home tx, ice, elevation.  Wound care of right wrist abrasion given.  Discussed tx of plantars wart including topical otc's vs freezing by pcp as options. Prn f/u anticipated.  Final Clinical Impressions(s) / ED Diagnoses   Final diagnoses:  Tibial plateau fracture, left, closed, initial encounter  Plantar wart of right foot    ED Discharge Orders        Ordered    oxyCODONE (ROXICODONE) 5 MG immediate release tablet  Every 4 hours PRN     05/20/18 2105       Evalee Jefferson, PA-C 05/21/18 Ruthven, Ankit, MD 05/21/18 [phone]

## 2018-05-20 NOTE — Discharge Instructions (Signed)
Wear the knee immobilizer at all times to protect your injury (except in the shower).  Ice and elevate your knee as much as possible. You may take the oxycodone prescribed for pain relief.  This will make you drowsy - do not drive within 4 hours of taking this medication.  Call Dr. Aline Brochure for an appointment for followup care this next week.

## 2018-05-20 NOTE — ED Triage Notes (Signed)
Pt states that she fell hurting her left knee and her right wrist hand she is also c/o of pain in the ball of her right foot also

## 2018-05-20 NOTE — ED Notes (Signed)
ED Provider at bedside. 

## 2018-05-21 DIAGNOSIS — M25572 Pain in left ankle and joints of left foot: Secondary | ICD-10-CM | POA: Diagnosis not present

## 2018-05-21 DIAGNOSIS — S93409S Sprain of unspecified ligament of unspecified ankle, sequela: Secondary | ICD-10-CM | POA: Diagnosis not present

## 2018-05-21 DIAGNOSIS — S82142A Displaced bicondylar fracture of left tibia, initial encounter for closed fracture: Secondary | ICD-10-CM | POA: Diagnosis not present

## 2018-05-23 DIAGNOSIS — R188 Other ascites: Secondary | ICD-10-CM | POA: Diagnosis not present

## 2018-05-23 DIAGNOSIS — K7031 Alcoholic cirrhosis of liver with ascites: Secondary | ICD-10-CM | POA: Diagnosis not present

## 2018-05-23 DIAGNOSIS — I8500 Esophageal varices without bleeding: Secondary | ICD-10-CM | POA: Diagnosis not present

## 2018-05-23 DIAGNOSIS — K7030 Alcoholic cirrhosis of liver without ascites: Secondary | ICD-10-CM | POA: Diagnosis not present

## 2018-05-23 DIAGNOSIS — K7290 Hepatic failure, unspecified without coma: Secondary | ICD-10-CM | POA: Diagnosis not present

## 2018-05-24 ENCOUNTER — Other Ambulatory Visit (HOSPITAL_COMMUNITY): Payer: Self-pay | Admitting: Nurse Practitioner

## 2018-05-24 DIAGNOSIS — R188 Other ascites: Secondary | ICD-10-CM

## 2018-05-27 ENCOUNTER — Ambulatory Visit (INDEPENDENT_AMBULATORY_CARE_PROVIDER_SITE_OTHER): Payer: Medicare HMO | Admitting: Orthopedic Surgery

## 2018-05-27 ENCOUNTER — Encounter: Payer: Self-pay | Admitting: Orthopedic Surgery

## 2018-05-27 VITALS — BP 114/71 | HR 83 | Ht 67.0 in

## 2018-05-27 DIAGNOSIS — G8929 Other chronic pain: Secondary | ICD-10-CM | POA: Diagnosis not present

## 2018-05-27 DIAGNOSIS — S93409S Sprain of unspecified ligament of unspecified ankle, sequela: Secondary | ICD-10-CM

## 2018-05-27 DIAGNOSIS — S82142A Displaced bicondylar fracture of left tibia, initial encounter for closed fracture: Secondary | ICD-10-CM | POA: Diagnosis not present

## 2018-05-27 DIAGNOSIS — M25572 Pain in left ankle and joints of left foot: Secondary | ICD-10-CM | POA: Diagnosis not present

## 2018-05-27 MED ORDER — OXYCODONE HCL 5 MG PO TABS: 5.0000 mg | ORAL_TABLET | Freq: Four times a day (QID) | ORAL | 0 refills | Status: AC | PRN

## 2018-05-27 MED ORDER — OXYCODONE HCL 5 MG PO TABS: 5.0000 mg | ORAL_TABLET | Freq: Four times a day (QID) | ORAL | 0 refills | Status: DC | PRN

## 2018-05-27 NOTE — Progress Notes (Signed)
NEW PATIENT OFFICE VISI  Chief Complaint  Patient presents with  . Knee Pain    left s/p injury 05/17/18    62 year old female presents for evaluation of fracture left tibial plateau.  She has a history of chronic liver disease  She complains of pain located over the lateral aspect of the left knee.  Her injury date was on July 16.  She complains of dull pain over the lateral plateau.  She walks in without any bracing but says the pain is severe.  She is having some limping when she is walking and pain when she moves her knee joint.  She had a CT scan which showed 1 mm depression x-ray looks fairly normal  She also complains of ankle pain status post left ankle sprain treated in March   Review of Systems  Gastrointestinal: Positive for abdominal pain.  Musculoskeletal: Positive for joint pain.  Neurological: Negative for tingling.     Past Medical History:  Diagnosis Date  . Anxiety    pt. denies  . Cirrhosis of liver ([place])   . Depression    pt. denies  . Heart murmur    "not concerned about "   . History of abdominal paracentesis   . History of blood transfusion   . Hypothyroidism   . Ileus ([place])   . Liver disease 2008  . Neuropathy   . Osteoporosis 02/09/2018  . Pancytopenia ([place])   . Pneumonia   . Vaginal Pap smear, abnormal     Past Surgical History:  Procedure Laterality Date  . AUGMENTATION MAMMAPLASTY Bilateral    20 years ago  . BIOPSY  03/15/2018   Procedure: BIOPSY;  Surgeon: Yetta Flock, MD;  Location: [facility];  Service: Gastroenterology;;  Gastric  . COLONOSCOPY WITH ESOPHAGOGASTRODUODENOSCOPY (EGD) AND ESOPHAGEAL DILATION (ED)    . COLONOSCOPY WITH PROPOFOL N/A 03/15/2018   Procedure: COLONOSCOPY WITH PROPOFOL;  Surgeon: Yetta Flock, MD;  Location: [facility];  Service: Gastroenterology;  Laterality: N/A;  . ESOPHAGOGASTRODUODENOSCOPY (EGD) WITH PROPOFOL N/A 03/15/2018   Procedure: ESOPHAGOGASTRODUODENOSCOPY (EGD) WITH PROPOFOL;   Surgeon: Yetta Flock, MD;  Location: [facility];  Service: Gastroenterology;  Laterality: N/A;  . ESOPHAGOGASTRODUODENOSCOPY W/ BANDING     Varices  . FRACTURE SURGERY Left    x 2 L elbow and hip left   . HARDWARE REMOVAL Left 09/17/2017   Procedure: HARDWARE REMOVAL LEFT OLECRANON;  Surgeon: Altamese [place], MD;  Location: [facility];  Service: Orthopedics;  Laterality: Left;  . HIP SURGERY Left    from fracture- rod in palce  . IR PARACENTESIS  11/18/2017  . IR PARACENTESIS  11/26/2017  . IR PARACENTESIS  03/14/2018  . KNEE SURGERY Right    open incision for ligaments  . ORIF ELBOW FRACTURE Left 04/30/2017   Procedure: REVISION OPEN REDUCTION INTERNAL FIXATION (ORIF) ELBOW/OLECRANON FRACTURE;  Surgeon: Altamese [place], MD;  Location: [facility];  Service: Orthopedics;  Laterality: Left;    Family History  Adopted: Yes  Problem Relation Age of Onset  . Alcohol abuse Daughter    Social History   Tobacco Use  . Smoking status: Never Smoker  . Smokeless tobacco: Never Used  Substance Use Topics  . Alcohol use: No  . Drug use: No    Allergies  Allergen Reactions  . Penicillins Swelling and Other (See Comments)    FACIAL SWELLING  PATIENT HAS HAD A PCN REACTION WITH IMMEDIATE RASH, FACIAL/TONGUE/THROAT SWELLING, SOB, OR LIGHTHEADEDNESS WITH HYPOTENSION:  #  #  #  YES  #  #  #   Has patient had a PCN reaction causing severe rash involving mucus membranes or skin necrosis: No Has patient had a PCN reaction that required hospitalization: No Has patient had a PCN reaction occurring within the last 10 years: No If all of the above answers are "NO", then may proceed with Cephalosporin u  . Tylenol [Acetaminophen] Nausea Only  . Sulfa Antibiotics Itching and Rash  . Tramadol Swelling and Rash    Current Meds  Medication Sig  . furosemide (LASIX) 40 MG tablet Take 1 tablet (40 mg total) by mouth 2 (two) times daily.  Marland Kitchen gabapentin (NEURONTIN) 100 MG capsule Take 2 capsules (200 mg  total) by mouth 3 (three) times daily.  Marland Kitchen lactulose (CHRONULAC) 10 GM/15ML solution Take 30 mLs (20 g total) by mouth 3 (three) times daily. (Patient taking differently: Take 20 g by mouth 2 (two) times daily. )  . levOCARNitine (CARNITOR) 330 MG tablet Take 330 mg by mouth 3 (three) times daily.  Marland Kitchen levothyroxine (SYNTHROID, LEVOTHROID) 75 MCG tablet Take 1 tablet (75 mcg total) by mouth daily before breakfast.  . omeprazole (PRILOSEC OTC) 20 MG tablet Take 1 tablet (20 mg total) by mouth 2 (two) times daily.  . ondansetron (ZOFRAN-ODT) 4 MG disintegrating tablet Take 4 mg by mouth every 8 (eight) hours as needed for nausea or vomiting.  . potassium chloride (K-DUR) 10 MEQ tablet Take 2 tablets (20 mEq total) by mouth 2 (two) times daily.  . QUEtiapine (SEROQUEL) 25 MG tablet Take 25 mg by mouth at bedtime.   . rifaximin (XIFAXAN) 550 MG TABS tablet Take 1 tablet (550 mg total) by mouth 2 (two) times daily.  . sertraline (ZOLOFT) 100 MG tablet Take 100 mg by mouth daily.   Marland Kitchen spironolactone (ALDACTONE) 100 MG tablet Take 2 tablets (200 mg total) by mouth daily.  . traZODone (DESYREL) 50 MG tablet Take 50 mg at bedtime by mouth.    BP 114/71   Pulse 83   Ht 5' 7"  (1.702 m)   BMI 21.14 kg/m   Physical Exam  Constitutional: She is oriented to person, place, and time. She appears well-developed.  Neurological: She is alert and oriented to person, place, and time.  Psychiatric: She has a normal mood and affect. Judgment normal.  Vitals reviewed.   Ortho Exam  Very thin female ambulates with a altered gait limping favoring her left leg  Right leg alignment is normal.  She has excellent range of motion which is full.  She has no gross muscle weakness.  Her knee is stable to anterior posterior drawer testing.  The skin is normal without rash or ulceration.  Normal sensation.  No peripheral edema.  Left knee leg alignment is normal there is tenderness over the lateral proximal tibial plateau  with  small effusion.  Painful range of motion which prevents full range of motion assessment with limits in extension and flexion.  There is no gross muscle atrophy or weakness.  The knee is stable to anterior and posterior drawer testing the skin is normal.  The sensation is excellent and peripheral edema none  MEDICAL DECISION SECTION  Xrays were done at [facility]  CT scan and x-rays were done  My independent reading of xrays:  Multiple views left knee  Impression X-ray shows 1 mm depression //  Multiplanar CT scan.  1 mm depression of lateral plateau, confirmed on CAT scan  Encounter Diagnoses  Name Primary?  Marland Kitchen  Closed fracture of left tibial plateau, initial encounter Yes  . Chronic pain of left ankle   . Mild ankle sprain, sequela     PLAN: (Rx., injectx, surgery, frx, mri/ct) Recommend hinged knee brace, weight-bear as tolerated.  I do not anticipate she will need further pain medication after this prescription.  She has Tylenol listed in the allergy section but this is probably related to the liver disease.    Meds ordered this encounter  Medications  . DISCONTD: oxyCODONE (ROXICODONE) 5 MG immediate release tablet    Sig: Take 1 tablet (5 mg total) by mouth every 6 (six) hours as needed for up to 5 days for severe pain.    Dispense:  20 tablet    Refill:  0  . oxyCODONE (ROXICODONE) 5 MG immediate release tablet    Sig: Take 1 tablet (5 mg total) by mouth every 6 (six) hours as needed for up to 5 days for severe pain.    Dispense:  20 tablet    Refill:  0    Arther Abbott, MD  05/27/2018 12:24 PM

## 2018-05-27 NOTE — Patient Instructions (Addendum)
Weightbearing as tolerated in brace  Do not wear brace to bed  X-ray in the office in 6 weeks  ASO brace for ankle

## 2018-05-29 ENCOUNTER — Other Ambulatory Visit: Payer: Self-pay | Admitting: Gastroenterology

## 2018-05-31 ENCOUNTER — Other Ambulatory Visit (HOSPITAL_COMMUNITY): Payer: Self-pay | Admitting: Nurse Practitioner

## 2018-05-31 ENCOUNTER — Other Ambulatory Visit (HOSPITAL_COMMUNITY): Payer: Self-pay | Admitting: Physician Assistant

## 2018-05-31 ENCOUNTER — Ambulatory Visit (HOSPITAL_COMMUNITY)
Admission: RE | Admit: 2018-05-31 | Discharge: 2018-05-31 | Disposition: A | Discharge status: Home / Self Care | Payer: Medicare HMO | Source: Ambulatory Visit | Attending: Nurse Practitioner | Admitting: Nurse Practitioner

## 2018-05-31 DIAGNOSIS — R188 Other ascites: Secondary | ICD-10-CM | POA: Insufficient documentation

## 2018-05-31 DIAGNOSIS — K7469 Other cirrhosis of liver: Secondary | ICD-10-CM | POA: Diagnosis not present

## 2018-05-31 DIAGNOSIS — Z9889 Other specified postprocedural states: Secondary | ICD-10-CM

## 2018-05-31 MED ORDER — LIDOCAINE HCL (PF) 2 % IJ SOLN
INTRAMUSCULAR | Status: AC
  Filled 2018-05-31: qty 20

## 2018-05-31 NOTE — Progress Notes (Signed)
Request for therapeutic paracentesis - limited abdominal US shows trace amounts of fluid not amenable to drainage today. Patient reports that her abdomen feels less distended today and believes she may have some bowel gas. No procedure performed today, patient agreeable to follow up with her PCP regarding continued abdominal distention and discomfort.   Candiss Norse, PA-C 05/31/18 1402

## 2018-07-05 DIAGNOSIS — I8500 Esophageal varices without bleeding: Secondary | ICD-10-CM | POA: Diagnosis not present

## 2018-07-05 DIAGNOSIS — K7030 Alcoholic cirrhosis of liver without ascites: Secondary | ICD-10-CM | POA: Diagnosis not present

## 2018-07-05 DIAGNOSIS — K7290 Hepatic failure, unspecified without coma: Secondary | ICD-10-CM | POA: Diagnosis not present

## 2018-07-07 ENCOUNTER — Encounter (HOSPITAL_COMMUNITY): Payer: Self-pay | Admitting: Emergency Medicine

## 2018-07-07 ENCOUNTER — Emergency Department (HOSPITAL_COMMUNITY): Payer: Medicare HMO

## 2018-07-07 ENCOUNTER — Emergency Department (HOSPITAL_COMMUNITY)
Admission: EM | Admit: 2018-07-07 | Discharge: 2018-07-07 | Disposition: A | Discharge status: Home / Self Care | Payer: Medicare HMO | Attending: Emergency Medicine | Admitting: Emergency Medicine

## 2018-07-07 ENCOUNTER — Other Ambulatory Visit: Payer: Self-pay

## 2018-07-07 DIAGNOSIS — S32058A Other fracture of fifth lumbar vertebra, initial encounter for closed fracture: Secondary | ICD-10-CM | POA: Diagnosis not present

## 2018-07-07 DIAGNOSIS — Y999 Unspecified external cause status: Secondary | ICD-10-CM | POA: Diagnosis not present

## 2018-07-07 DIAGNOSIS — S32059A Unspecified fracture of fifth lumbar vertebra, initial encounter for closed fracture: Secondary | ICD-10-CM | POA: Diagnosis not present

## 2018-07-07 DIAGNOSIS — S299XXA Unspecified injury of thorax, initial encounter: Secondary | ICD-10-CM | POA: Diagnosis not present

## 2018-07-07 DIAGNOSIS — S301XXA Contusion of abdominal wall, initial encounter: Secondary | ICD-10-CM | POA: Insufficient documentation

## 2018-07-07 DIAGNOSIS — S2231XA Fracture of one rib, right side, initial encounter for closed fracture: Secondary | ICD-10-CM | POA: Diagnosis not present

## 2018-07-07 DIAGNOSIS — M25511 Pain in right shoulder: Secondary | ICD-10-CM | POA: Diagnosis not present

## 2018-07-07 DIAGNOSIS — W06XXXA Fall from bed, initial encounter: Secondary | ICD-10-CM | POA: Insufficient documentation

## 2018-07-07 DIAGNOSIS — Y92013 Bedroom of single-family (private) house as the place of occurrence of the external cause: Secondary | ICD-10-CM | POA: Diagnosis not present

## 2018-07-07 DIAGNOSIS — Y9389 Activity, other specified: Secondary | ICD-10-CM | POA: Insufficient documentation

## 2018-07-07 DIAGNOSIS — Z79899 Other long term (current) drug therapy: Secondary | ICD-10-CM | POA: Insufficient documentation

## 2018-07-07 DIAGNOSIS — D696 Thrombocytopenia, unspecified: Secondary | ICD-10-CM | POA: Insufficient documentation

## 2018-07-07 DIAGNOSIS — M545 Low back pain: Secondary | ICD-10-CM | POA: Insufficient documentation

## 2018-07-07 DIAGNOSIS — R1011 Right upper quadrant pain: Secondary | ICD-10-CM | POA: Diagnosis not present

## 2018-07-07 DIAGNOSIS — S4991XA Unspecified injury of right shoulder and upper arm, initial encounter: Secondary | ICD-10-CM | POA: Diagnosis not present

## 2018-07-07 DIAGNOSIS — S3991XA Unspecified injury of abdomen, initial encounter: Secondary | ICD-10-CM | POA: Diagnosis not present

## 2018-07-07 LAB — COMPREHENSIVE METABOLIC PANEL
ALT: 18 U/L (ref 0–44)
AST: 47 U/L — ABNORMAL HIGH (ref 15–41)
Albumin: 3.1 g/dL — ABNORMAL LOW (ref 3.5–5.0)
Alkaline Phosphatase: 153 U/L — ABNORMAL HIGH (ref 38–126)
Anion gap: 9 (ref 5–15)
BUN: 8 mg/dL (ref 6–20)
CO2: 25 mmol/L (ref 22–32)
Calcium: 9 mg/dL (ref 8.9–10.3)
Chloride: 105 mmol/L (ref 98–111)
Creatinine, Ser: 0.68 mg/dL (ref 0.44–1.00)
GFR calc Af Amer: >60 mL/min (ref >60)
GFR calc non Af Amer: >60 mL/min (ref >60)
Glucose, Bld: 102 mg/dL — ABNORMAL HIGH (ref 70–99)
Potassium: 3.4 mmol/L — ABNORMAL LOW (ref 3.5–5.1)
Sodium: 139 mmol/L (ref 135–145)
Total Bilirubin: 2 mg/dL — ABNORMAL HIGH (ref 0.3–1.2)
Total Protein: 6.1 g/dL — ABNORMAL LOW (ref 6.5–8.1)

## 2018-07-07 LAB — CBC WITH DIFFERENTIAL/PLATELET
Basophils Absolute: 0 10*3/uL (ref 0.0–0.1)
Basophils Relative: 0 %
Eosinophils Absolute: 0.1 10*3/uL (ref 0.0–0.7)
Eosinophils Relative: 2 %
HCT: 29.7 % — ABNORMAL LOW (ref 36.0–46.0)
Hemoglobin: 9.4 g/dL — ABNORMAL LOW (ref 12.0–15.0)
Lymphocytes Relative: 7 %
Lymphs Abs: 0.5 10*3/uL — ABNORMAL LOW (ref 0.7–4.0)
MCH: 27.8 pg (ref 26.0–34.0)
MCHC: 31.6 g/dL (ref 30.0–36.0)
MCV: 87.9 fL (ref 78.0–100.0)
Monocytes Absolute: 1.3 10*3/uL — ABNORMAL HIGH (ref 0.1–1.0)
Monocytes Relative: 20 %
Neutro Abs: 4.7 10*3/uL (ref 1.7–7.7)
Neutrophils Relative %: 71 %
Platelets: 30 10*3/uL — ABNORMAL LOW (ref 150–400)
RBC: 3.38 MIL/uL — ABNORMAL LOW (ref 3.87–5.11)
RDW: 16.7 % — ABNORMAL HIGH (ref 11.5–15.5)
WBC: 6.6 10*3/uL (ref 4.0–10.5)

## 2018-07-07 LAB — LIPASE, BLOOD: Lipase: 39 U/L (ref 11–51)

## 2018-07-07 MED ORDER — MORPHINE SULFATE (PF) 2 MG/ML IV SOLN
2.0000 mg | Freq: Once | INTRAVENOUS | Status: AC
  Administered 2018-07-07: 2 mg via INTRAVENOUS
  Filled 2018-07-07: qty 1

## 2018-07-07 MED ORDER — IOPAMIDOL (ISOVUE-300) INJECTION 61%
100.0000 mL | Freq: Once | INTRAVENOUS | Status: AC | PRN
  Administered 2018-07-07: 100 mL via INTRAVENOUS

## 2018-07-07 MED ORDER — ONDANSETRON HCL 4 MG/2ML IJ SOLN
4.0000 mg | Freq: Once | INTRAMUSCULAR | Status: AC
  Administered 2018-07-07: 4 mg via INTRAVENOUS
  Filled 2018-07-07: qty 2

## 2018-07-07 MED ORDER — OXYCODONE-ACETAMINOPHEN 5-325 MG PO TABS: 1.0000 | ORAL_TABLET | Freq: Four times a day (QID) | ORAL | 0 refills | Status: DC | PRN

## 2018-07-07 NOTE — ED Provider Notes (Signed)
[facility] EMERGENCY DEPARTMENT Provider Note   CSN: [id number] Arrival date & time: 07/07/18  1039     History   Chief Complaint Chief Complaint  Patient presents with  . Shoulder Pain    HPI Megan Roberson is a 62 y.o. female.  HPI   Megan Roberson is a 62 y.o. female with a history of pancytopenia, cirrhosis of the liver, anemia, and multiple fractures presents to the Emergency Department complaining of right shoulder pain and right upper abdominal pain.  Symptoms began on the morning prior to arrival.  She states that she fell off the bed and wedged herself between the bed and a piece of furniture 3 days ago.  She describes an aching pain to her shoulder that is associated with movement.  Pain improves at rest, and pain does not radiate down her arm.  She also complains of low back pain, but states this is chronic and denies injury associated with her recent fall.  She also reports history of pain and bruising of her abdomen.  She denies headache, dizziness, visual changes, and neck pain, numbness or weakness of the upper extremities.  No LOC, or vomiting.   Past Medical History:  Diagnosis Date  . Anxiety    pt. denies  . Cirrhosis of liver ([place])   . Depression    pt. denies  . Heart murmur    "not concerned about "   . History of abdominal paracentesis   . History of blood transfusion   . Hypothyroidism   . Ileus ([place])   . Liver disease 2008  . Neuropathy   . Osteoporosis 02/09/2018  . Pancytopenia ([place])   . Pneumonia   . Vaginal Pap smear, abnormal     Patient Active Problem List   Diagnosis Date Noted  . 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 Osteoporosis 02/09/2018  . Colon cancer screening 01/18/2018  . Ascites 11/25/2017  . Cellulitis, abdominal wall 11/25/2017  . SOB (shortness of breath) 11/25/2017  . Protein-calorie malnutrition, severe 11/06/2017  . Ogilvie's syndrome   . Abdominal distension   . Abdominal pain   . Liver failure without  hepatic coma ([place])   . Thrombocytopenia ([place])   . Closed fracture of lumbar spine without lesion of spinal cord ([place])   . Cirrhosis of liver ([place]) 04/21/2017  . Anemia 04/21/2017  . History of open reduction and internal fixation (ORIF) procedure 04/21/2017  . Visit for suture removal 04/21/2017  . Left Olecranon fracture 04/21/2017  . Hypokalemia 04/21/2017    Past Surgical History:  Procedure Laterality Date  . AUGMENTATION MAMMAPLASTY Bilateral    20 years ago  . BIOPSY  03/15/2018   Procedure: BIOPSY;  Surgeon: Yetta Flock, MD;  Location: [facility];  Service: Gastroenterology;;  Gastric  . COLONOSCOPY WITH ESOPHAGOGASTRODUODENOSCOPY (EGD) AND ESOPHAGEAL DILATION (ED)    . COLONOSCOPY WITH PROPOFOL N/A 03/15/2018   Procedure: COLONOSCOPY WITH PROPOFOL;  Surgeon: Yetta Flock, MD;  Location: [facility];  Service: Gastroenterology;  Laterality: N/A;  . ESOPHAGOGASTRODUODENOSCOPY (EGD) WITH PROPOFOL N/A 03/15/2018   Procedure: ESOPHAGOGASTRODUODENOSCOPY (EGD) WITH PROPOFOL;  Surgeon: Yetta Flock, MD;  Location: [facility];  Service: Gastroenterology;  Laterality: N/A;  . ESOPHAGOGASTRODUODENOSCOPY W/ BANDING     Varices  . FRACTURE SURGERY Left    x 2 L elbow and hip left   . HARDWARE REMOVAL Left 09/17/2017   Procedure: HARDWARE REMOVAL LEFT OLECRANON;  Surgeon: Altamese Iredell, MD;  Location: [facility];  Service: Orthopedics;  Laterality: Left;  . HIP SURGERY Left    from fracture- rod in palce  . IR PARACENTESIS  11/18/2017  . IR PARACENTESIS  11/26/2017  . IR PARACENTESIS  03/14/2018  . KNEE SURGERY Right    open incision for ligaments  . ORIF ELBOW FRACTURE Left 04/30/2017   Procedure: REVISION OPEN REDUCTION INTERNAL FIXATION (ORIF) ELBOW/OLECRANON FRACTURE;  Surgeon: Altamese Thurmond, MD;  Location: [facility];  Service: Orthopedics;  Laterality: Left;     OB History    Gravida  2   Para  2   Term  2   Preterm      AB      Living  1     SAB       TAB      Ectopic      Multiple      Live Births  2            Home Medications    Prior to Admission medications   Medication Sig Start Date End Date Taking? Authorizing Provider  furosemide (LASIX) 40 MG tablet Take 1 tablet (40 mg total) by mouth 2 (two) times daily. 01/05/18   Armbruster, Carlota Raspberry, MD  gabapentin (NEURONTIN) 100 MG capsule Take 2 capsules (200 mg total) by mouth 3 (three) times daily. 11/09/17   Cherene Altes, MD  lactulose (CHRONULAC) 10 GM/15ML solution Take 30 mLs (20 g total) by mouth 3 (three) times daily. Patient taking differently: Take 20 g by mouth 2 (two) times daily.  12/01/17   Aline August, MD  levOCARNitine (CARNITOR) 330 MG tablet Take 330 mg by mouth 3 (three) times daily.    [provider]  levothyroxine (SYNTHROID, LEVOTHROID) 75 MCG tablet Take 1 tablet (75 mcg total) by mouth daily before breakfast. 12/02/17   Aline August, MD  omeprazole (PRILOSEC OTC) 20 MG tablet Take 1 tablet (20 mg total) by mouth 2 (two) times daily. 03/15/18   Armbruster, Carlota Raspberry, MD  ondansetron (ZOFRAN-ODT) 4 MG disintegrating tablet Take 4 mg by mouth every 8 (eight) hours as needed for nausea or vomiting.    [provider]  potassium chloride (K-DUR) 10 MEQ tablet Take 2 tablets (20 mEq total) by mouth 2 (two) times daily. 03/15/18   Armbruster, Carlota Raspberry, MD  QUEtiapine (SEROQUEL) 25 MG tablet Take 25 mg by mouth at bedtime.     [provider]  rifaximin (XIFAXAN) 550 MG TABS tablet Take 1 tablet (550 mg total) by mouth 2 (two) times daily. 11/09/17   Cherene Altes, MD  sertraline (ZOLOFT) 100 MG tablet Take 100 mg by mouth daily.     [provider]  spironolactone (ALDACTONE) 100 MG tablet Take 2 tablets (200 mg total) by mouth daily. 12/02/17   Aline August, MD  traZODone (DESYREL) 50 MG tablet Take 50 mg at bedtime by mouth.    [provider]    Family History Family History  Adopted: Yes  Problem  Relation Age of Onset  . Alcohol abuse Daughter     Social History Social History   Tobacco Use  . Smoking status: Never Smoker  . Smokeless tobacco: Never Used  Substance Use Topics  . Alcohol use: No  . Drug use: No     Allergies   Penicillins; Tylenol [acetaminophen]; Sulfa antibiotics; and Tramadol   Review of Systems Review of Systems  Constitutional: Negative for chills and fever.  Respiratory: Negative for chest tightness and shortness of breath.   Cardiovascular:  Negative for chest pain.  Gastrointestinal: Positive for abdominal pain. Negative for nausea and vomiting.  Genitourinary: Negative for decreased urine volume, difficulty urinating, dysuria and hematuria.  Musculoskeletal: Positive for arthralgias (Right shoulder pain). Negative for back pain, joint swelling and neck pain.  Skin: Negative for color change and wound.  Neurological: Negative for dizziness, syncope, numbness and headaches.  Psychiatric/Behavioral: Negative for confusion.     Physical Exam Updated Vital Signs BP (!) 104/56 (BP Location: Left Arm)   Pulse 82   Temp 98.7 F (37.1 C) (Oral)   Resp 18   Ht 5' 7"  (1.702 m)   Wt 63.5 kg   SpO2 92%   BMI 21.93 kg/m   Physical Exam  Constitutional: She is oriented to person, place, and time. She appears well-developed. No distress.  HENT:  Head: Atraumatic.  Eyes: Pupils are equal, round, and reactive to light. EOM are normal.  Neck: Normal range of motion. Neck supple.  Cardiovascular: Normal rate, regular rhythm and intact distal pulses.  No murmur heard. Pulmonary/Chest: Effort normal. She exhibits tenderness (Tender to palpation of the right lateral ribs.  No crepitus or bony deformity.).  Abdominal: Soft. She exhibits distension (Abdomen appears mildly distended.).  Large area of ecchymosis to the right upper abdomen.  Mild tenderness to palpation.  No guarding or rebound tenderness.  Musculoskeletal: She exhibits tenderness.    Diffuse tenderness to the anterior right shoulder.  No bony deformities or edema.  Pain reproduced with abduction.  Grip strength is strong and symmetrical.  Neurological: She is alert and oriented to person, place, and time. No sensory deficit.  CN II-XII grossly  Intact, speech clear  Skin: Skin is warm. Capillary refill takes less than 2 seconds.  Psychiatric: She has a normal mood and affect.  Nursing note and vitals reviewed.    ED Treatments / Results  Labs (all labs ordered are listed, but only abnormal results are displayed) Labs Reviewed  COMPREHENSIVE METABOLIC PANEL - Abnormal; Notable for the following components:      Result Value   Potassium 3.4 (*)    Glucose, Bld 102 (*)    Total Protein 6.1 (*)    Albumin 3.1 (*)    AST 47 (*)    Alkaline Phosphatase 153 (*)    Total Bilirubin 2.0 (*)    All other components within normal limits  CBC WITH DIFFERENTIAL/PLATELET - Abnormal; Notable for the following components:   RBC 3.38 (*)    Hemoglobin 9.4 (*)    HCT 29.7 (*)    RDW 16.7 (*)    Platelets 30 (*)    Lymphs Abs 0.5 (*)    Monocytes Absolute 1.3 (*)    All other components within normal limits  LIPASE, BLOOD    EKG None  Radiology Dg Shoulder Right  Result Date: 07/07/2018 CLINICAL DATA:  [place] 2 days ago with right shoulder pain today EXAM: RIGHT SHOULDER - 2+ VIEW COMPARISON:  None. FINDINGS: Right humeral head is in normal position and the glenohumeral joint space appears normal. The right AC joint is normally aligned. No acute abnormality is seen. A fracture of the lateral right fourth rib cannot be excluded. IMPRESSION: 1. Negative right shoulder. 2. Cannot exclude fracture of the right lateral fourth rib. Electronically Signed   By: Ivar Drape M.D.   On: 07/07/2018 12:05   Ct Chest W Contrast  Result Date: 07/07/2018 CLINICAL DATA:  [place] 3 days ago, possible right 4th rib fx per xray [place] 3  days ago, possible right 4th rib fx per xray Right shoulder  pain EXAM: CT CHEST, ABDOMEN, AND PELVIS WITH CONTRAST TECHNIQUE: Multidetector CT imaging of the chest, abdomen and pelvis was performed following the standard protocol during bolus administration of intravenous contrast. CONTRAST:  142m ISOVUE-300 IOPAMIDOL (ISOVUE-300) INJECTION 61% COMPARISON:  RIGHT shoulder on 07/07/2018, CT of the abdomen and pelvis on 11/28/2017 FINDINGS: CT CHEST FINDINGS Cardiovascular: Heart size is normal. No significant atherosclerotic calcification of the coronary vessels. There is minimal calcification of the thoracic aorta not associated with aneurysm. Pulmonary arteries are well opacified accounting for the contrast bolus timing. Mediastinum/Nodes: The visualized portion of the thyroid gland has a normal appearance. The esophagus is normal in appearance. No mediastinal, hilar, or axillary adenopathy. Lungs/Pleura: No pneumothorax. There is minimal scarring versus atelectasis at the lung bases. No contusion. Musculoskeletal: There is wedge deformity of T6, T7, and T8, likely chronic fractures. Chronic fractures are identified at T12 and L1. There is irregularity of numerous ribs including the RIGHT LATERAL fourth rib. Suspect minimally displaced fracture of the RIGHT fourth rib. Deformity of other ribs bilaterally may be chronic. The patient has bilateral retropectoral implants. CT ABDOMEN PELVIS FINDINGS Hepatobiliary: The liver is small and nodular consistent with cirrhosis. There is a small amount of perihepatic fluid, low-attenuation. Ascites is favored over hemorrhage. There are numerous layering calcified gallstones. Pancreas: Unremarkable. No pancreatic ductal dilatation or surrounding inflammatory changes. Spleen: Spleen is enlarged and otherwise normal in appearance. No evidence for acute splenic injury. Adrenals/Urinary Tract: Within the RIGHT renal pelvis there is a 9 millimeter calculus, stable in appearance. No evidence for urinary tract obstruction. No suspicious renal  masses. There is symmetric enhancement and excretion from both kidneys. Stomach/Bowel: The stomach is normal in appearance. Small bowel loops are normal in appearance. There is thickening of the wall of the ascending and proximal transverse colon. The distal colon is more normal in appearance. No evidence for obstructing mass. Vascular/Lymphatic: No evidence for aortic aneurysm. No retroperitoneal or mesenteric adenopathy. Reproductive: The uterus is present. RIGHT adnexal cystic mass is 5.0 centimeters. LEFT adnexal region is unremarkable. Other: No free pelvic fluid. Anterior abdominal wall is unremarkable. Musculoskeletal: There is an acute wedge compression fracture of L5 on with 20% loss of anterior height. Remote ORIF of the LEFT hip. Remote fracture of the LEFT iliac wing. Remote fractures of the superior and inferior pubic rami on the LEFT. Remote fracture of the RIGHT hemi sacrum. IMPRESSION: 1. Cirrhosis, ascites, and portal venous hypertension. 2. Possible acute fracture of the RIGHT fourth rib. Numerous remote rib fractures. 3. No pneumothorax or consolidation/contusion. 4. Chronic vertebral fractures at T6, T7, T8, T12, and L1. 5. Acute fracture of L5 with 20% loss of anterior height. No retropulsion. 6. Numerous remote pelvic fractures. 7. Colitis of the ascending colon, consistent with infectious, or inflammatory process. The changes are not felt to be related to trauma. 8. No evidence for acute injury of the solid organs. 9. RIGHT adnexal cystic lesion warrants further evaluation. Recommend follow-up ultrasound of the pelvis. Electronically Signed   By: ENolon NationsM.D.   On: 07/07/2018 14:47   Ct Abdomen Pelvis W Contrast  Result Date: 07/07/2018 CLINICAL DATA:  [place], possible right 4th rib fx per xray Fell 3 days ago, possible right 4th rib fx per xray Right shoulder pain EXAM: CT CHEST, ABDOMEN, AND PELVIS WITH CONTRAST TECHNIQUE: Multidetector CT imaging of the chest, abdomen  and pelvis was performed following the standard  protocol during bolus administration of intravenous contrast. CONTRAST:  140m ISOVUE-300 IOPAMIDOL (ISOVUE-300) INJECTION 61% COMPARISON:  RIGHT shoulder on 07/07/2018, CT of the abdomen and pelvis on 11/28/2017 FINDINGS: CT CHEST FINDINGS Cardiovascular: Heart size is normal. No significant atherosclerotic calcification of the coronary vessels. There is minimal calcification of the thoracic aorta not associated with aneurysm. Pulmonary arteries are well opacified accounting for the contrast bolus timing. Mediastinum/Nodes: The visualized portion of the thyroid gland has a normal appearance. The esophagus is normal in appearance. No mediastinal, hilar, or axillary adenopathy. Lungs/Pleura: No pneumothorax. There is minimal scarring versus atelectasis at the lung bases. No contusion. Musculoskeletal: There is wedge deformity of T6, T7, and T8, likely chronic fractures. Chronic fractures are identified at T12 and L1. There is irregularity of numerous ribs including the RIGHT LATERAL fourth rib. Suspect minimally displaced fracture of the RIGHT fourth rib. Deformity of other ribs bilaterally may be chronic. The patient has bilateral retropectoral implants. CT ABDOMEN PELVIS FINDINGS Hepatobiliary: The liver is small and nodular consistent with cirrhosis. There is a small amount of perihepatic fluid, low-attenuation. Ascites is favored over hemorrhage. There are numerous layering calcified gallstones. Pancreas: Unremarkable. No pancreatic ductal dilatation or surrounding inflammatory changes. Spleen: Spleen is enlarged and otherwise normal in appearance. No evidence for acute splenic injury. Adrenals/Urinary Tract: Within the RIGHT renal pelvis there is a 9 millimeter calculus, stable in appearance. No evidence for urinary tract obstruction. No suspicious renal masses. There is symmetric enhancement and excretion from both kidneys. Stomach/Bowel: The stomach is normal in  appearance. Small bowel loops are normal in appearance. There is thickening of the wall of the ascending and proximal transverse colon. The distal colon is more normal in appearance. No evidence for obstructing mass. Vascular/Lymphatic: No evidence for aortic aneurysm. No retroperitoneal or mesenteric adenopathy. Reproductive: The uterus is present. RIGHT adnexal cystic mass is 5.0 centimeters. LEFT adnexal region is unremarkable. Other: No free pelvic fluid. Anterior abdominal wall is unremarkable. Musculoskeletal: There is an acute wedge compression fracture of L5 on with 20% loss of anterior height. Remote ORIF of the LEFT hip. Remote fracture of the LEFT iliac wing. Remote fractures of the superior and inferior pubic rami on the LEFT. Remote fracture of the RIGHT hemi sacrum. IMPRESSION: 1. Cirrhosis, ascites, and portal venous hypertension. 2. Possible acute fracture of the RIGHT fourth rib. Numerous remote rib fractures. 3. No pneumothorax or consolidation/contusion. 4. Chronic vertebral fractures at T6, T7, T8, T12, and L1. 5. Acute fracture of L5 with 20% loss of anterior height. No retropulsion. 6. Numerous remote pelvic fractures. 7. Colitis of the ascending colon, consistent with infectious, or inflammatory process. The changes are not felt to be related to trauma. 8. No evidence for acute injury of the solid organs. 9. RIGHT adnexal cystic lesion warrants further evaluation. Recommend follow-up ultrasound of the pelvis. Electronically Signed   By: ENolon NationsM.D.   On: 07/07/2018 14:47    Procedures Procedures (including critical care time)  Medications Ordered in ED Medications  morphine 2 MG/ML injection 2 mg (2 mg Intravenous Given 07/07/18 1239)  ondansetron (ZOFRAN) injection 4 mg (4 mg Intravenous Given 07/07/18 1237)  iopamidol (ISOVUE-300) 61 % injection 100 mL (100 mLs Intravenous Contrast Given 07/07/18 1325)  morphine 2 MG/ML injection 2 mg (2 mg Intravenous Given 07/07/18 1429)      Initial Impression / Assessment and Plan / ED Course  I have reviewed the triage vital signs and the nursing notes.  Pertinent labs & imaging  results that were available during my care of the patient were reviewed by me and considered in my medical decision making (see chart for details).      Imaging results reviewed.  Acute fracture of L5 noted on CT image of abdomen and pelvis.  Given patient's thrombocytopenia, I will consult neurosurgery.  [place] on call neurosurgery and discussed findings.  Recommends close PCP f/u, TLSO brace   On further history taking, pt reports having low back pain previously, and has a TLSO brace at home.  She states that she feels her back pain is chronic and not related to her recent fall.  Discussed importance of wearing her brace and possible development of spinal hematoma as fx resulted on CT as new.  I have also discussed importance of close orthopedic follow-up, pt verbalized understanding and agrees to plan.  Return precautions provided.   Final Clinical Impressions(s) / ED Diagnoses   Final diagnoses:  Acute pain of right shoulder  Closed fracture of fifth lumbar vertebra, unspecified fracture morphology, initial encounter [facility])  Closed fracture of one rib of right side, initial encounter    ED Discharge Orders    None       Kem Parkinson, PA-C 07/09/18 1705    Milton Ferguson, MD 07/11/18 1514

## 2018-07-07 NOTE — ED Notes (Signed)
Patient given discharge instruction, verbalized understand. IV removed, band aid applied. Patient wheelchair out of the department.  

## 2018-07-07 NOTE — Discharge Instructions (Addendum)
As discussed, it is important that you wear your back brace daily.  be careful not to fall as your platelet count is very low.  Call your primary doctor tomorrow to arrange a follow-up appointment early next week.  Return to the ER for any worsening symptoms.

## 2018-07-07 NOTE — ED Triage Notes (Addendum)
Pt states she woke up with right shoulder pain.  Pt States " I think I slept wrong."  Pt states falling 3 days ago but no pain when she fell

## 2018-07-08 DIAGNOSIS — S82142A Displaced bicondylar fracture of left tibia, initial encounter for closed fracture: Secondary | ICD-10-CM | POA: Insufficient documentation

## 2018-07-11 ENCOUNTER — Ambulatory Visit: Payer: Medicare HMO | Admitting: Orthopedic Surgery

## 2018-07-11 ENCOUNTER — Encounter: Payer: Self-pay | Admitting: Orthopedic Surgery

## 2018-07-11 ENCOUNTER — Telehealth: Payer: Self-pay | Admitting: Orthopedic Surgery

## 2018-07-11 NOTE — Telephone Encounter (Signed)
Called patient, notified, and scheduled accordingly.

## 2018-07-11 NOTE — Telephone Encounter (Signed)
Spoke with patient regarding today's appt 07/11/18,offered this pm-unable to come; also mentions new injury to right shoulder and lumbar spine-fracture of 5th lumbar, per treatment at [facility] Emergency room. Please advise if we are to schedule for both problems. Presently, patient is re-scheduled to next Monday, 07/18/18, for fracture care of left tibia plateau. Ph# [phone]

## 2018-07-11 NOTE — Telephone Encounter (Signed)
IF NEW PROBLEM WILL NEED 2 APPT SPOTS

## 2018-07-18 ENCOUNTER — Ambulatory Visit: Payer: Medicare HMO | Admitting: Orthopedic Surgery

## 2018-07-18 ENCOUNTER — Encounter: Payer: Self-pay | Admitting: Orthopedic Surgery

## 2018-09-08 ENCOUNTER — Other Ambulatory Visit: Payer: Self-pay | Admitting: Gastroenterology

## 2018-09-23 DIAGNOSIS — D696 Thrombocytopenia, unspecified: Secondary | ICD-10-CM | POA: Diagnosis not present

## 2018-09-23 DIAGNOSIS — G9009 Other idiopathic peripheral autonomic neuropathy: Secondary | ICD-10-CM | POA: Diagnosis not present

## 2018-09-23 DIAGNOSIS — S8292XD Unspecified fracture of left lower leg, subsequent encounter for closed fracture with routine healing: Secondary | ICD-10-CM | POA: Diagnosis not present

## 2018-09-23 DIAGNOSIS — R110 Nausea: Secondary | ICD-10-CM | POA: Diagnosis not present

## 2018-09-23 DIAGNOSIS — K7460 Unspecified cirrhosis of liver: Secondary | ICD-10-CM | POA: Diagnosis not present

## 2018-09-23 DIAGNOSIS — M810 Age-related osteoporosis without current pathological fracture: Secondary | ICD-10-CM | POA: Diagnosis not present

## 2018-09-23 DIAGNOSIS — Z23 Encounter for immunization: Secondary | ICD-10-CM | POA: Diagnosis not present

## 2018-09-23 DIAGNOSIS — Z6824 Body mass index (BMI) 24.0-24.9, adult: Secondary | ICD-10-CM | POA: Diagnosis not present

## 2018-09-23 DIAGNOSIS — F39 Unspecified mood [affective] disorder: Secondary | ICD-10-CM | POA: Diagnosis not present

## 2018-09-23 DIAGNOSIS — M7670 Peroneal tendinitis, unspecified leg: Secondary | ICD-10-CM | POA: Diagnosis not present

## 2018-09-23 DIAGNOSIS — Z0000 Encounter for general adult medical examination without abnormal findings: Secondary | ICD-10-CM | POA: Diagnosis not present

## 2018-09-23 DIAGNOSIS — M8580 Other specified disorders of bone density and structure, unspecified site: Secondary | ICD-10-CM | POA: Diagnosis not present

## 2018-09-23 DIAGNOSIS — D638 Anemia in other chronic diseases classified elsewhere: Secondary | ICD-10-CM | POA: Diagnosis not present

## 2018-09-26 DIAGNOSIS — E039 Hypothyroidism, unspecified: Secondary | ICD-10-CM | POA: Diagnosis not present

## 2018-09-26 DIAGNOSIS — L309 Dermatitis, unspecified: Secondary | ICD-10-CM | POA: Diagnosis not present

## 2018-09-26 DIAGNOSIS — E46 Unspecified protein-calorie malnutrition: Secondary | ICD-10-CM | POA: Diagnosis not present

## 2018-09-26 DIAGNOSIS — E8809 Other disorders of plasma-protein metabolism, not elsewhere classified: Secondary | ICD-10-CM | POA: Diagnosis not present

## 2018-09-26 DIAGNOSIS — E876 Hypokalemia: Secondary | ICD-10-CM | POA: Diagnosis not present

## 2018-09-26 DIAGNOSIS — G9009 Other idiopathic peripheral autonomic neuropathy: Secondary | ICD-10-CM | POA: Diagnosis not present

## 2018-09-26 DIAGNOSIS — K120 Recurrent oral aphthae: Secondary | ICD-10-CM | POA: Diagnosis not present

## 2018-09-26 DIAGNOSIS — M545 Low back pain: Secondary | ICD-10-CM | POA: Diagnosis not present

## 2018-09-26 DIAGNOSIS — K7030 Alcoholic cirrhosis of liver without ascites: Secondary | ICD-10-CM | POA: Diagnosis not present

## 2018-09-27 DIAGNOSIS — K7030 Alcoholic cirrhosis of liver without ascites: Secondary | ICD-10-CM | POA: Diagnosis not present

## 2018-10-10 DIAGNOSIS — K7460 Unspecified cirrhosis of liver: Secondary | ICD-10-CM | POA: Diagnosis not present

## 2018-10-10 DIAGNOSIS — S8292XD Unspecified fracture of left lower leg, subsequent encounter for closed fracture with routine healing: Secondary | ICD-10-CM | POA: Diagnosis not present

## 2018-10-10 DIAGNOSIS — M810 Age-related osteoporosis without current pathological fracture: Secondary | ICD-10-CM | POA: Diagnosis not present

## 2018-10-10 DIAGNOSIS — D638 Anemia in other chronic diseases classified elsewhere: Secondary | ICD-10-CM | POA: Diagnosis not present

## 2018-10-10 DIAGNOSIS — M7670 Peroneal tendinitis, unspecified leg: Secondary | ICD-10-CM | POA: Diagnosis not present

## 2018-10-10 DIAGNOSIS — M545 Low back pain: Secondary | ICD-10-CM | POA: Diagnosis not present

## 2018-10-10 DIAGNOSIS — Z0000 Encounter for general adult medical examination without abnormal findings: Secondary | ICD-10-CM | POA: Diagnosis not present

## 2018-10-10 DIAGNOSIS — D696 Thrombocytopenia, unspecified: Secondary | ICD-10-CM | POA: Diagnosis not present

## 2018-10-10 DIAGNOSIS — Z6824 Body mass index (BMI) 24.0-24.9, adult: Secondary | ICD-10-CM | POA: Diagnosis not present

## 2018-10-10 DIAGNOSIS — G9009 Other idiopathic peripheral autonomic neuropathy: Secondary | ICD-10-CM | POA: Diagnosis not present

## 2018-10-17 ENCOUNTER — Encounter (HOSPITAL_COMMUNITY)
Admission: RE | Admit: 2018-10-17 | Discharge: 2018-10-17 | Disposition: A | Discharge status: Home / Self Care | Payer: Medicare HMO | Source: Ambulatory Visit | Attending: Internal Medicine | Admitting: Internal Medicine

## 2018-10-17 DIAGNOSIS — E876 Hypokalemia: Secondary | ICD-10-CM | POA: Diagnosis not present

## 2018-10-17 DIAGNOSIS — G9009 Other idiopathic peripheral autonomic neuropathy: Secondary | ICD-10-CM | POA: Diagnosis not present

## 2018-10-17 DIAGNOSIS — M810 Age-related osteoporosis without current pathological fracture: Secondary | ICD-10-CM | POA: Insufficient documentation

## 2018-10-17 MED ORDER — DENOSUMAB 60 MG/ML ~~LOC~~ SOSY
60.0000 mg | PREFILLED_SYRINGE | Freq: Once | SUBCUTANEOUS | Status: AC
  Administered 2018-10-17: 60 mg via SUBCUTANEOUS

## 2018-10-17 MED ORDER — DENOSUMAB 60 MG/ML ~~LOC~~ SOSY
PREFILLED_SYRINGE | SUBCUTANEOUS | Status: AC
  Filled 2018-10-17: qty 1

## 2018-10-17 NOTE — Discharge Instructions (Signed)
Denosumab injection °What is this medicine? °DENOSUMAB (den oh sue mab) slows bone breakdown. Prolia is used to treat osteoporosis in women after menopause and in men. Xgeva is used to treat a high calcium level due to cancer and to prevent bone fractures and other bone problems caused by multiple myeloma or cancer bone metastases. Xgeva is also used to treat giant cell tumor of the bone. °This medicine may be used for other purposes; ask your health care provider or pharmacist if you have questions. °COMMON BRAND NAME(S): Prolia, XGEVA °What should I tell my health care provider before I take this medicine? °They need to know if you have any of these conditions: °-dental disease °-having surgery or tooth extraction °-infection °-kidney disease °-low levels of calcium or Vitamin D in the blood °-malnutrition °-on hemodialysis °-skin conditions or sensitivity °-thyroid or parathyroid disease °-an unusual reaction to denosumab, other medicines, foods, dyes, or preservatives °-pregnant or trying to get pregnant °-breast-feeding °How should I use this medicine? °This medicine is for injection under the skin. It is given by a health care professional in a hospital or clinic setting. °If you are getting Prolia, a special MedGuide will be given to you by the pharmacist with each prescription and refill. Be sure to read this information carefully each time. °For Prolia, talk to your pediatrician regarding the use of this medicine in children. Special care may be needed. For Xgeva, talk to your pediatrician regarding the use of this medicine in children. While this drug may be prescribed for children as young as 13 years for selected conditions, precautions do apply. °Overdosage: If you think you have taken too much of this medicine contact a poison control center or emergency room at once. °NOTE: This medicine is only for you. Do not share this medicine with others. °What if I miss a dose? °It is important not to miss your  dose. Call your doctor or health care professional if you are unable to keep an appointment. °What may interact with this medicine? °Do not take this medicine with any of the following medications: °-other medicines containing denosumab °This medicine may also interact with the following medications: °-medicines that lower your chance of fighting infection °-steroid medicines like prednisone or cortisone °This list may not describe all possible interactions. Give your health care provider a list of all the medicines, herbs, non-prescription drugs, or dietary supplements you use. Also tell them if you smoke, drink alcohol, or use illegal drugs. Some items may interact with your medicine. °What should I watch for while using this medicine? °Visit your doctor or health care professional for regular checks on your progress. Your doctor or health care professional may order blood tests and other tests to see how you are doing. °Call your doctor or health care professional for advice if you get a fever, chills or sore throat, or other symptoms of a cold or flu. Do not treat yourself. This drug may decrease your body's ability to fight infection. Try to avoid being around people who are sick. °You should make sure you get enough calcium and vitamin D while you are taking this medicine, unless your doctor tells you not to. Discuss the foods you eat and the vitamins you take with your health care professional. °See your dentist regularly. Brush and floss your teeth as directed. Before you have any dental work done, tell your dentist you are receiving this medicine. °Do not become pregnant while taking this medicine or for 5 months after stopping   it. Talk with your doctor or health care professional about your birth control options while taking this medicine. Women should inform their doctor if they wish to become pregnant or think they might be pregnant. There is a potential for serious side effects to an unborn child. Talk  to your health care professional or pharmacist for more information. What side effects may I notice from receiving this medicine? Side effects that you should report to your doctor or health care professional as soon as possible: -allergic reactions like skin rash, itching or hives, swelling of the face, lips, or tongue -bone pain -breathing problems -dizziness -jaw pain, especially after dental work -redness, blistering, peeling of the skin -signs and symptoms of infection like fever or chills; cough; sore throat; pain or trouble passing urine -signs of low calcium like fast heartbeat, muscle cramps or muscle pain; pain, tingling, numbness in the hands or feet; seizures -unusual bleeding or bruising -unusually weak or tired Side effects that usually do not require medical attention (report to your doctor or health care professional if they continue or are bothersome): -constipation -diarrhea -headache -joint pain -loss of appetite -muscle pain -runny nose -tiredness -upset stomach This list may not describe all possible side effects. Call your doctor for medical advice about side effects. You may report side effects to FDA at 1-800-FDA-1088. Where should I keep my medicine? This medicine is only given in a clinic, doctor's office, or other health care setting and will not be stored at home. NOTE: This sheet is a summary. It may not cover all possible information. If you have questions about this medicine, talk to your doctor, pharmacist, or health care provider.  2018 Elsevier/Gold Standard (2016-11-10 19:17:21)

## 2018-12-11 ENCOUNTER — Other Ambulatory Visit: Payer: Self-pay | Admitting: Gastroenterology

## 2018-12-21 ENCOUNTER — Other Ambulatory Visit: Payer: Self-pay | Admitting: Nurse Practitioner

## 2018-12-21 ENCOUNTER — Other Ambulatory Visit (HOSPITAL_COMMUNITY): Payer: Self-pay | Admitting: Nurse Practitioner

## 2018-12-21 DIAGNOSIS — K7290 Hepatic failure, unspecified without coma: Secondary | ICD-10-CM | POA: Diagnosis not present

## 2018-12-21 DIAGNOSIS — I8500 Esophageal varices without bleeding: Secondary | ICD-10-CM | POA: Diagnosis not present

## 2018-12-21 DIAGNOSIS — K7030 Alcoholic cirrhosis of liver without ascites: Secondary | ICD-10-CM | POA: Diagnosis not present

## 2018-12-21 DIAGNOSIS — K7469 Other cirrhosis of liver: Secondary | ICD-10-CM

## 2018-12-22 ENCOUNTER — Ambulatory Visit (INDEPENDENT_AMBULATORY_CARE_PROVIDER_SITE_OTHER): Payer: Medicare HMO

## 2018-12-22 ENCOUNTER — Other Ambulatory Visit: Payer: Self-pay | Admitting: Orthopedic Surgery

## 2018-12-22 DIAGNOSIS — W19XXXA Unspecified fall, initial encounter: Secondary | ICD-10-CM

## 2018-12-22 DIAGNOSIS — M545 Low back pain: Secondary | ICD-10-CM

## 2018-12-28 ENCOUNTER — Ambulatory Visit (HOSPITAL_COMMUNITY): Payer: Medicare HMO

## 2019-01-05 ENCOUNTER — Ambulatory Visit (HOSPITAL_COMMUNITY)
Admission: RE | Admit: 2019-01-05 | Discharge: 2019-01-05 | Disposition: A | Discharge status: Home / Self Care | Payer: Medicare HMO | Source: Ambulatory Visit | Attending: Nurse Practitioner | Admitting: Nurse Practitioner

## 2019-01-05 DIAGNOSIS — K7460 Unspecified cirrhosis of liver: Secondary | ICD-10-CM | POA: Diagnosis not present

## 2019-01-05 DIAGNOSIS — K7469 Other cirrhosis of liver: Secondary | ICD-10-CM | POA: Insufficient documentation

## 2019-01-12 DIAGNOSIS — H65199 Other acute nonsuppurative otitis media, unspecified ear: Secondary | ICD-10-CM | POA: Diagnosis not present

## 2019-01-12 DIAGNOSIS — J019 Acute sinusitis, unspecified: Secondary | ICD-10-CM | POA: Diagnosis not present

## 2019-01-12 DIAGNOSIS — J060 Acute laryngopharyngitis: Secondary | ICD-10-CM | POA: Diagnosis not present

## 2019-01-14 ENCOUNTER — Other Ambulatory Visit: Payer: Self-pay | Admitting: Gastroenterology

## 2019-02-06 DIAGNOSIS — S8292XD Unspecified fracture of left lower leg, subsequent encounter for closed fracture with routine healing: Secondary | ICD-10-CM | POA: Diagnosis not present

## 2019-02-06 DIAGNOSIS — D696 Thrombocytopenia, unspecified: Secondary | ICD-10-CM | POA: Diagnosis not present

## 2019-02-06 DIAGNOSIS — Z0000 Encounter for general adult medical examination without abnormal findings: Secondary | ICD-10-CM | POA: Diagnosis not present

## 2019-02-06 DIAGNOSIS — M7670 Peroneal tendinitis, unspecified leg: Secondary | ICD-10-CM | POA: Diagnosis not present

## 2019-02-06 DIAGNOSIS — G9009 Other idiopathic peripheral autonomic neuropathy: Secondary | ICD-10-CM | POA: Diagnosis not present

## 2019-02-06 DIAGNOSIS — E46 Unspecified protein-calorie malnutrition: Secondary | ICD-10-CM | POA: Diagnosis not present

## 2019-02-06 DIAGNOSIS — Z712 Person consulting for explanation of examination or test findings: Secondary | ICD-10-CM | POA: Diagnosis not present

## 2019-02-06 DIAGNOSIS — M810 Age-related osteoporosis without current pathological fracture: Secondary | ICD-10-CM | POA: Diagnosis not present

## 2019-02-06 DIAGNOSIS — D638 Anemia in other chronic diseases classified elsewhere: Secondary | ICD-10-CM | POA: Diagnosis not present

## 2019-02-06 DIAGNOSIS — K7460 Unspecified cirrhosis of liver: Secondary | ICD-10-CM | POA: Diagnosis not present

## 2019-02-07 ENCOUNTER — Other Ambulatory Visit: Payer: Self-pay | Admitting: Internal Medicine

## 2019-02-13 NOTE — Discharge Instructions (Signed)

## 2019-02-14 ENCOUNTER — Encounter (HOSPITAL_COMMUNITY)
Admission: RE | Admit: 2019-02-14 | Discharge: 2019-02-14 | Disposition: A | Discharge status: Home / Self Care | Payer: Medicare HMO | Source: Ambulatory Visit | Attending: Internal Medicine | Admitting: Internal Medicine

## 2019-02-14 DIAGNOSIS — D509 Iron deficiency anemia, unspecified: Secondary | ICD-10-CM | POA: Insufficient documentation

## 2019-02-14 NOTE — Progress Notes (Signed)
Patient missed appointment today for scheduled Injectafer infusion. Patient called and states " forgot my appointment". Rescheduled for the 02/16/2019.

## 2019-02-15 ENCOUNTER — Other Ambulatory Visit: Payer: Self-pay | Admitting: Gastroenterology

## 2019-02-15 NOTE — Telephone Encounter (Signed)
Called and spoke to pt.  Scheduled her for an appt with [facility] for Monday, 4-20.

## 2019-02-16 ENCOUNTER — Encounter (HOSPITAL_COMMUNITY)
Admission: RE | Admit: 2019-02-16 | Discharge: 2019-02-16 | Disposition: A | Discharge status: Home / Self Care | Payer: Medicare HMO | Source: Ambulatory Visit | Attending: Internal Medicine | Admitting: Internal Medicine

## 2019-02-16 ENCOUNTER — Other Ambulatory Visit: Payer: Self-pay

## 2019-02-16 DIAGNOSIS — D509 Iron deficiency anemia, unspecified: Secondary | ICD-10-CM | POA: Diagnosis not present

## 2019-02-16 MED ORDER — SODIUM CHLORIDE 0.9 % IV SOLN
Freq: Once | INTRAVENOUS | Status: AC
  Administered 2019-02-16: 11:00:00 via INTRAVENOUS

## 2019-02-16 MED ORDER — SODIUM CHLORIDE 0.9 % IV SOLN
750.0000 mg | Freq: Once | INTRAVENOUS | Status: AC
  Administered 2019-02-16: 750 mg via INTRAVENOUS
  Filled 2019-02-16: qty 15

## 2019-02-20 ENCOUNTER — Encounter: Payer: Self-pay | Admitting: Gastroenterology

## 2019-02-20 ENCOUNTER — Other Ambulatory Visit: Payer: Self-pay

## 2019-02-20 ENCOUNTER — Ambulatory Visit (INDEPENDENT_AMBULATORY_CARE_PROVIDER_SITE_OTHER): Payer: Medicare HMO | Admitting: Gastroenterology

## 2019-02-20 VITALS — Ht 66.0 in | Wt 140.0 lb

## 2019-02-20 DIAGNOSIS — Z9181 History of falling: Secondary | ICD-10-CM

## 2019-02-20 DIAGNOSIS — I8500 Esophageal varices without bleeding: Secondary | ICD-10-CM

## 2019-02-20 DIAGNOSIS — K7460 Unspecified cirrhosis of liver: Secondary | ICD-10-CM

## 2019-02-20 DIAGNOSIS — R188 Other ascites: Secondary | ICD-10-CM

## 2019-02-20 DIAGNOSIS — Z1211 Encounter for screening for malignant neoplasm of colon: Secondary | ICD-10-CM

## 2019-02-20 DIAGNOSIS — D649 Anemia, unspecified: Secondary | ICD-10-CM | POA: Diagnosis not present

## 2019-02-20 DIAGNOSIS — K7290 Hepatic failure, unspecified without coma: Secondary | ICD-10-CM | POA: Diagnosis not present

## 2019-02-20 DIAGNOSIS — K7682 Hepatic encephalopathy: Secondary | ICD-10-CM

## 2019-02-20 NOTE — Patient Instructions (Addendum)
If you are age 61 or older, your body mass index should be between 23-30. Your Body mass index is 22.6 kg/m. If this is out of the aforementioned range listed, please consider follow up with your Primary Care Provider.  If you are age 68 or younger, your body mass index should be between 19-25. Your Body mass index is 22.6 kg/m. If this is out of the aformentioned range listed, please consider follow up with your Primary Care Provider.   Continue lactulose daily.  We are requesting your lab records from Dr. Olivia Canter office.   Thank you for entrusting me with your care and for choosing [facility], Dr. Sunol Cellar

## 2019-02-20 NOTE — Progress Notes (Addendum)
THIS ENCOUNTER IS A VIRTUAL VISIT DUE TO COVID-19 - PATIENT WAS NOT SEEN IN THE OFFICE. PATIENT HAS CONSENTED TO VIRTUAL VISIT / TELEMEDICINE VISIT. 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 She's had decompensations to include ascites, hepatic encephalopathy, and bleeding varices in the past. She is followed by Dr. Zollie Scale / [place] [facility] in the hepatology transplant clinic. She remains on lasix 16m BID and aldactone 2076m/ day. She thinks this mostly works to keep fluid status okay. Main issue appears to be encephalopathy and her gait. Husband is concerned about recurrent falls, and she has a history of multiple fractures. She is adament she has not used any alcohol. She is taking Rifaximin but not the lactulose due to fear of diarrhea and urgent stools. She is constipated at baseline. She has had multiple fractures due to the falls, her hepatology team was concerned about this in light of her osteoporosis and had recommended PT, possible neurology consult and orthopedics consult. She has not had any of these consultations. She has not been on beta blocker given severe ascites and volume status issues.  She tells me she has had worsening anemia with iron infusions although I don't see any recent labs regarding this. Last labs per Hepatology noted Hgb of 9s in December. Dr. [place] Craneas been coordinating her IV iron infusions. She is not having any blood in the stools. Adopted, no known CRC in the family. She has ongoing back pain from compression fractures and awaiting MRI of her spine. She feels that her gait is not stable and husband more concerned this is due to the  encephalopathy and is concerned she is not taking her lactulose which is related. She has not been thought to be a candidate for TIPS in the past due to encephalopathy. Seen by hepatology 12/21/2018  Her last EGD showed trace varices in 03/2018 as well as gastritis and esophageal candidiasis. She is taking omeprazole 2044mShe otherwise had a colonoscopy 03/2018. She had severely congested colonic mucosa due to portal hypertension. She had a significantly redundant colon and with edema, despite trial of multiple colonoscope, cecal intubation was not achieved, somewhere in right colon suspected.  HCCTuscumbiareening done with RUQ US [place] 01/05/19.   EGD 03/15/2018 - Esophagogastric landmarks identified. - Trace to small esophageal varices without high risk stigmata - banding not performed in light of small size and thrombocytopenia. - Multiple white plaques in the esophagus. Brushings performed to rule out candidiasis. - Non-bleeding small gastric ulcer with no stigmata of bleeding. - Gastritis. Biopsied to rule out H pylori - Normal duodenal bulb and second portion of the duodenum.,   Colonoscopy 03/15/18 -  The perianal and digital rectal examinations were normal. Findings: The colon was redundant with severe looping. The entire colon was severely congested / edematous, consistent with changes due to portal hypertension. Most severe in the sigmoid colon which made visualization difficut. Due to severe looping / redundancy / edema, initial attempt at cecal intubation with pediatric colonoscope was not succesfull. The scope was exchanged for an adult colonoscope. The suspected ascending colon was reached but cecal intubation was not successful despite  multiple attempts / abdominal pressure. Internal hemorrhoids were found during retroflexion.  RUQ [place] 01/05/19 - no mass lesions, cirrhosis, gallstones  CT scan abdomen 07/07/2018 - multiple vertebral fractures, fracture of right rib, remote pelvic fractures,  "inflammation of the ascending colon", R adnexal cystic lesion   Past Medical History:  Diagnosis Date  . Anxiety    pt. denies  . Cirrhosis of liver ([place])   . Depression    pt. denies  . Heart murmur    "not concerned about "   . History of abdominal paracentesis   . History of blood transfusion   . Hypothyroidism   . Ileus ([place])   . Liver disease 2008  . Neuropathy   . Osteoporosis 02/09/2018  . Pancytopenia ([place])   . Pneumonia   . Vaginal Pap smear, abnormal      Past Surgical History:  Procedure Laterality Date  . AUGMENTATION MAMMAPLASTY Bilateral    20 years ago  . BIOPSY  03/15/2018   Procedure: BIOPSY;  Surgeon: Yetta Flock, MD;  Location: [facility];  Service: Gastroenterology;;  Gastric  . COLONOSCOPY WITH ESOPHAGOGASTRODUODENOSCOPY (EGD) AND ESOPHAGEAL DILATION (ED)    . COLONOSCOPY WITH PROPOFOL N/A 03/15/2018   Procedure: COLONOSCOPY WITH PROPOFOL;  Surgeon: Yetta Flock, MD;  Location: [facility];  Service: Gastroenterology;  Laterality: N/A;  . ESOPHAGOGASTRODUODENOSCOPY (EGD) WITH PROPOFOL N/A 03/15/2018   Procedure: ESOPHAGOGASTRODUODENOSCOPY (EGD) WITH PROPOFOL;  Surgeon: Yetta Flock, MD;  Location: [facility];  Service: Gastroenterology;  Laterality: N/A;  . ESOPHAGOGASTRODUODENOSCOPY W/ BANDING     Varices  . FRACTURE SURGERY Left    x 2 L elbow and hip left   . HARDWARE REMOVAL Left 09/17/2017   Procedure: HARDWARE REMOVAL LEFT OLECRANON;  Surgeon: Altamese [place], MD;  Location: [facility];  Service: Orthopedics;  Laterality: Left;  . HIP SURGERY Left    from fracture- rod in palce  . IR PARACENTESIS  11/18/2017  . IR PARACENTESIS  11/26/2017  . IR PARACENTESIS  03/14/2018  . KNEE SURGERY Right    open incision for ligaments  . ORIF ELBOW FRACTURE Left 04/30/2017   Procedure: REVISION OPEN REDUCTION INTERNAL FIXATION (ORIF) ELBOW/OLECRANON FRACTURE;  Surgeon: Altamese [place], MD;  Location: [facility];  Service: Orthopedics;   Laterality: Left;   Family History  Adopted: Yes  Problem Relation Age of Onset  . Alcohol abuse Daughter    Social History   Tobacco Use  . Smoking status: Never Smoker  . Smokeless tobacco: Never Used  Substance Use Topics  . Alcohol use: No  . Drug use: No   Current Outpatient Medications  Medication Sig Dispense Refill  . furosemide (LASIX) 40 MG tablet Take 1 tablet (40 mg total) by mouth 2 (two) times daily. 120 tablet 3  . gabapentin (NEURONTIN) 100 MG capsule Take 2 capsules (200 mg total) by mouth 3 (three) times daily. (Patient taking differently: Take 200 mg by mouth as needed. ) 90 capsule 0  . lactulose (CHRONULAC) 10 GM/15ML solution Take 30 mLs (20 g total) by mouth 3 (three) times daily. (Patient taking differently: Take 20 g by mouth daily as needed. ) 946 mL 0  . levOCARNitine (CARNITOR) 330 MG tablet Take 330 mg by mouth 3 (three) times daily.    Marland Kitchen levothyroxine (SYNTHROID, LEVOTHROID) 75 MCG tablet Take 1 tablet (75 mcg total) by mouth daily before breakfast. 30 tablet 0  . omeprazole (PRILOSEC) 20 MG capsule Take 1 capsule (20 mg total) by mouth 2 (two)  times daily before a meal. (Patient taking differently: Take 20 mg by mouth as needed. ) 60 capsule 0  . ondansetron (ZOFRAN-ODT) 4 MG disintegrating tablet Take 4 mg by mouth every 8 (eight) hours as needed for nausea or vomiting.    Marland Kitchen QUEtiapine (SEROQUEL) 25 MG tablet Take 25 mg by mouth at bedtime.     . rifaximin (XIFAXAN) 550 MG TABS tablet Take 1 tablet (550 mg total) by mouth 2 (two) times daily. 60 tablet 0  . sertraline (ZOLOFT) 100 MG tablet Take 100 mg by mouth daily.     Marland Kitchen spironolactone (ALDACTONE) 100 MG tablet Take 2 tablets (200 mg total) by mouth daily. 30 tablet 0  . traZODone (DESYREL) 50 MG tablet Take 50 mg at bedtime by mouth.     No current facility-administered medications for this visit.    Allergies  Allergen Reactions  . Penicillins Swelling and Other (See Comments)    FACIAL  SWELLING  PATIENT HAS HAD A PCN REACTION WITH IMMEDIATE RASH, FACIAL/TONGUE/THROAT SWELLING, SOB, OR LIGHTHEADEDNESS WITH HYPOTENSION:  #  #  #  YES  #  #  #   Has patient had a PCN reaction causing severe rash involving mucus membranes or skin necrosis: No Has patient had a PCN reaction that required hospitalization: No Has patient had a PCN reaction occurring within the last 10 years: No If all of the above answers are "NO", then may proceed with Cephalosporin u  . Tylenol [Acetaminophen] Nausea Only  . Sulfa Antibiotics Itching and Rash  . Tramadol Swelling and Rash     Review of Systems: All systems reviewed and negative except where noted in HPI.    No results found.  Physical Exam: Ht 5' 6"  (1.676 m) Comment: pt provided over the phone  Wt 140 lb (63.5 kg) Comment: pt provided over the phone  BMI 22.60 kg/m   NA   ASSESSMENT AND PLAN:  63 y/o female here for reassessment of the following issues:  Cirrhosis / hepatic encephalopathy / esophageal varices / history of falls / anemia - followed by hepatology, she does not meet criteria for transplant yet but remains quite frail. Numerous fractures, multiple falls, which may be due to hepatic encephalopathy, she is not taking her lactulose. I discussed the importance of taking the lactulose with her, unfortunately loose stools will be expected with this but very important she is compliant with it. I would do this and see how she does, agree with PT consult for gait stability and possible neurology consult, I recommend she touch base with Dr. [place] Crane about these. Otherwise relatively recent EGD and colonoscopy as above, apparently she received IV Iron but I don't see labs in regards to severity of her anemia or iron stores, not sure if from portal hypertensive gastritis or something else. She is not on beta blockade due to ascites / diuretics need. [place] screening UTD. Repeat EGD in 2021. We could not reach the cecum during her colonoscopy  as outlined, we discussed possible virtual colonoscopy to clear the right colon and she is interested in this. Will get records first from PCP so I can review that and then discuss scheduling. She will continue to follow with hepatology.  Colon cancer screening - as above, may proceed with virtual colonoscopy to clear right colon  Ovarian cyst - need to see if she has had a follow up pelvic US, don't see any in our system, this should be done to follow up prior CT changes  if not already.   Quail Cellar, MD [facility]  CC: Celene Squibb, MD

## 2019-02-21 ENCOUNTER — Encounter (HOSPITAL_COMMUNITY): Payer: Medicare HMO

## 2019-02-22 ENCOUNTER — Other Ambulatory Visit: Payer: Self-pay

## 2019-02-22 ENCOUNTER — Telehealth: Payer: Self-pay | Admitting: Gastroenterology

## 2019-02-22 DIAGNOSIS — D696 Thrombocytopenia, unspecified: Secondary | ICD-10-CM

## 2019-02-22 DIAGNOSIS — D649 Anemia, unspecified: Secondary | ICD-10-CM

## 2019-02-22 NOTE — Telephone Encounter (Signed)
Records received from Dr. Juel Burrow office, labs as follows:  Lab 02/06/19 - Hgb 8.0, MCV 78, plt 34, WBC 3.3, MCV 78 BUN 13, CR 1.05 AST 40, ALT 16, T bil 1.7, TSH normal  She has received IV iron from Dr. Nevada Crane for this, defer to him for her follow up CBC.  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 She did not have a clear cause on colonoscopy however cecal intubation was not possible given edema of her colon / severe redundancy. Given her anemia I think a virtual colonoscopy is reasonable to clear her entire colon.    Megan Roberson can you let the patient know I got her labs, I think virtual colonoscopy is reasonable if you can help schedule with radiology, if they are doing those exams in the next month or so.  She also needs a pelvic US to follow up an ovarian cyst noted on last CT scan, if you can help coordinate that as well.  Thanks

## 2019-02-22 NOTE — Telephone Encounter (Signed)
Procedures are not being done until June:  Patient scheduled at [facility] for Virtual Colon-04/14/19 @ 8:00am and Pelvic US 04/04/19 @11 :00am. Both being done at  [address]. Patient called with appt. Information and Prep information

## 2019-02-23 ENCOUNTER — Encounter (HOSPITAL_COMMUNITY)
Admission: RE | Admit: 2019-02-23 | Discharge: 2019-02-23 | Disposition: A | Discharge status: Home / Self Care | Payer: Medicare HMO | Source: Ambulatory Visit | Attending: Internal Medicine | Admitting: Internal Medicine

## 2019-02-23 ENCOUNTER — Telehealth: Payer: Self-pay | Admitting: Gastroenterology

## 2019-02-23 ENCOUNTER — Telehealth: Payer: Self-pay

## 2019-02-23 NOTE — Telephone Encounter (Signed)
-----   Message from Yetta Flock, MD sent at 02/20/2019  6:03 PM EDT ----- Regarding: follow up plans Hey Jan,  Can you keep this patient on your radar to follow up for the following: - need to get labs from Dr. Nevada Crane - may need virtual colonoscopy scheduled, will discuss timing of this - she needs a pelvic US to follow up a prior CT scan abnormality. Can you see if she ever got this? I noticed it when reviewing her file after she hung up. If she has not had it, will need a pelvic US.  Thanks

## 2019-02-23 NOTE — Telephone Encounter (Signed)
Called and LM for pt.  We did receive lab results from Dr. Juel Burrow office - put on Dr. Doyne Keel desk.    Dr. Havery Moros - FYI- pt is scheduled for a "U/S Pelvic complete with Transvaginal" on 04-04-2019 and Virtual Colon on 6-12.  These were scheduled by Sherlynn Stalls.

## 2019-02-23 NOTE — Telephone Encounter (Signed)
Told patient we had spoke since that message was left. No new information

## 2019-02-23 NOTE — Telephone Encounter (Signed)
Patient is returning your call.  

## 2019-02-26 ENCOUNTER — Encounter: Payer: Self-pay | Admitting: Orthopedic Surgery

## 2019-02-27 DIAGNOSIS — M545 Low back pain, unspecified: Secondary | ICD-10-CM | POA: Insufficient documentation

## 2019-02-27 DIAGNOSIS — M25572 Pain in left ankle and joints of left foot: Secondary | ICD-10-CM | POA: Diagnosis not present

## 2019-02-27 DIAGNOSIS — S82832A Other fracture of upper and lower end of left fibula, initial encounter for closed fracture: Secondary | ICD-10-CM | POA: Diagnosis not present

## 2019-03-02 ENCOUNTER — Telehealth (HOSPITAL_COMMUNITY): Payer: Self-pay | Admitting: *Deleted

## 2019-03-02 NOTE — Telephone Encounter (Signed)
Patient hasn't attended appointments for injectafer, which were to start on 4/14.  Message left for patient to call back and either cancel or reschedule.  Message left for Dr Bonnetta [facility] well.

## 2019-03-02 NOTE — Telephone Encounter (Signed)
Jan, could you please send pt a message with her appt dates for virtual colon and [place]. I got the impression that she was not aware that she has been scheduled for those exams. She stated that she was told that she was going to be scheduled for those but nobody confirmed dates with her. Thank you.

## 2019-03-02 NOTE — Telephone Encounter (Signed)
Called patient and gave her appt. Information again.Had given on 02/23/19 but forgot to document it. Pelvic US 04/04/19 be there @11 :00am at [place] [place]. Told patient to drink a lot of water 1 hr.before and not to empty her bladder, for the [place].  Virtual Colon 04/14/19 be there  @7 :40am also at [facility]. Asked patient to pick-up Prep and instructions 1 week before @ [address]. Also ask patient to wear a mask(per [facility] request)

## 2019-03-09 DIAGNOSIS — M545 Low back pain: Secondary | ICD-10-CM | POA: Diagnosis not present

## 2019-03-18 ENCOUNTER — Other Ambulatory Visit: Payer: Self-pay | Admitting: Gastroenterology

## 2019-03-20 DIAGNOSIS — M545 Low back pain: Secondary | ICD-10-CM | POA: Diagnosis not present

## 2019-03-20 DIAGNOSIS — N858 Other specified noninflammatory disorders of uterus: Secondary | ICD-10-CM | POA: Diagnosis not present

## 2019-03-23 ENCOUNTER — Other Ambulatory Visit (HOSPITAL_COMMUNITY): Payer: Self-pay | Admitting: Adult Health

## 2019-03-23 DIAGNOSIS — Z1231 Encounter for screening mammogram for malignant neoplasm of breast: Secondary | ICD-10-CM

## 2019-03-31 ENCOUNTER — Ambulatory Visit (HOSPITAL_COMMUNITY): Payer: Medicare HMO

## 2019-04-03 ENCOUNTER — Telehealth: Payer: Self-pay | Admitting: Gastroenterology

## 2019-04-03 NOTE — Telephone Encounter (Signed)
Patient would like to know if she can pick up her prep for her CT. a

## 2019-04-03 NOTE — Telephone Encounter (Signed)
Called and LM for pt to go by [facility] [address] to pick up instructions and prep for virtual Colonoscopy scheduled for 04-14-19.

## 2019-04-04 ENCOUNTER — Telehealth: Payer: Self-pay

## 2019-04-04 ENCOUNTER — Other Ambulatory Visit: Payer: Medicare HMO

## 2019-04-04 ENCOUNTER — Ambulatory Visit
Admission: RE | Admit: 2019-04-04 | Discharge: 2019-04-04 | Disposition: A | Discharge status: Home / Self Care | Payer: Medicare HMO | Source: Ambulatory Visit | Attending: Gastroenterology | Admitting: Gastroenterology

## 2019-04-04 DIAGNOSIS — N83291 Other ovarian cyst, right side: Secondary | ICD-10-CM | POA: Diagnosis not present

## 2019-04-04 DIAGNOSIS — N83292 Other ovarian cyst, left side: Secondary | ICD-10-CM | POA: Diagnosis not present

## 2019-04-04 DIAGNOSIS — D696 Thrombocytopenia, unspecified: Secondary | ICD-10-CM

## 2019-04-04 DIAGNOSIS — D649 Anemia, unspecified: Secondary | ICD-10-CM

## 2019-04-04 NOTE — Telephone Encounter (Signed)
Left message for patient to call back  

## 2019-04-05 ENCOUNTER — Other Ambulatory Visit: Payer: Self-pay

## 2019-04-05 DIAGNOSIS — Z1211 Encounter for screening for malignant neoplasm of colon: Secondary | ICD-10-CM

## 2019-04-06 ENCOUNTER — Telehealth: Payer: Self-pay

## 2019-04-06 ENCOUNTER — Ambulatory Visit (HOSPITAL_COMMUNITY): Payer: Medicare HMO

## 2019-04-06 NOTE — Telephone Encounter (Signed)
Tried multiple times to reach patient. Finally left message on her voice mail to expect a call from [facility], that we did a referral to them.

## 2019-04-07 ENCOUNTER — Telehealth: Payer: Self-pay | Admitting: *Deleted

## 2019-04-07 NOTE — Telephone Encounter (Signed)
Called and scheduled the patient for a new patient referral, left a message to call the office

## 2019-04-10 ENCOUNTER — Telehealth: Payer: Self-pay | Admitting: *Deleted

## 2019-04-10 NOTE — Telephone Encounter (Signed)
Called and left the patient a message to call the office back. Patient needs a new patient appt

## 2019-04-10 NOTE — Telephone Encounter (Signed)
Patient called to reschedule her canceled appt from Friday. Explained to the patient that her appt was at [place]. Scheduled the patient to see Dr. Skeet Latch tomorrow

## 2019-04-11 ENCOUNTER — Other Ambulatory Visit: Payer: Self-pay

## 2019-04-11 ENCOUNTER — Encounter: Payer: Self-pay | Admitting: Gynecologic Oncology

## 2019-04-11 ENCOUNTER — Inpatient Hospital Stay: Payer: Medicare HMO | Attending: Gynecologic Oncology | Admitting: Gynecologic Oncology

## 2019-04-11 ENCOUNTER — Inpatient Hospital Stay: Payer: Medicare HMO

## 2019-04-11 VITALS — BP 125/72 | HR 72 | Temp 98.0°F | Resp 16 | Ht 66.0 in | Wt 156.7 lb

## 2019-04-11 DIAGNOSIS — E039 Hypothyroidism, unspecified: Secondary | ICD-10-CM | POA: Insufficient documentation

## 2019-04-11 DIAGNOSIS — N838 Other noninflammatory disorders of ovary, fallopian tube and broad ligament: Secondary | ICD-10-CM

## 2019-04-11 DIAGNOSIS — N83201 Unspecified ovarian cyst, right side: Secondary | ICD-10-CM | POA: Insufficient documentation

## 2019-04-11 DIAGNOSIS — K7031 Alcoholic cirrhosis of liver with ascites: Secondary | ICD-10-CM | POA: Insufficient documentation

## 2019-04-11 DIAGNOSIS — I8501 Esophageal varices with bleeding: Secondary | ICD-10-CM | POA: Diagnosis not present

## 2019-04-11 DIAGNOSIS — K766 Portal hypertension: Secondary | ICD-10-CM

## 2019-04-11 DIAGNOSIS — F329 Major depressive disorder, single episode, unspecified: Secondary | ICD-10-CM | POA: Insufficient documentation

## 2019-04-11 DIAGNOSIS — Z1273 Encounter for screening for malignant neoplasm of ovary: Secondary | ICD-10-CM | POA: Diagnosis not present

## 2019-04-11 DIAGNOSIS — M810 Age-related osteoporosis without current pathological fracture: Secondary | ICD-10-CM | POA: Insufficient documentation

## 2019-04-11 DIAGNOSIS — K7460 Unspecified cirrhosis of liver: Secondary | ICD-10-CM | POA: Diagnosis not present

## 2019-04-11 DIAGNOSIS — I8510 Secondary esophageal varices without bleeding: Secondary | ICD-10-CM | POA: Diagnosis not present

## 2019-04-11 DIAGNOSIS — R011 Cardiac murmur, unspecified: Secondary | ICD-10-CM | POA: Insufficient documentation

## 2019-04-11 LAB — COMPREHENSIVE METABOLIC PANEL
ALT: 14 U/L (ref 0–44)
AST: 47 U/L — ABNORMAL HIGH (ref 15–41)
Albumin: 3.3 g/dL — ABNORMAL LOW (ref 3.5–5.0)
Alkaline Phosphatase: 118 U/L (ref 38–126)
Anion gap: 9 (ref 5–15)
BUN: 12 mg/dL (ref 6–20)
CO2: 27 mmol/L (ref 22–32)
Calcium: 9 mg/dL (ref 8.9–10.3)
Chloride: 104 mmol/L (ref 98–111)
Creatinine, Ser: 0.91 mg/dL (ref 0.44–1.00)
GFR calc Af Amer: >60 mL/min (ref >60)
GFR calc non Af Amer: >60 mL/min (ref >60)
Glucose, Bld: 94 mg/dL (ref 70–99)
Potassium: 4 mmol/L (ref 3.5–5.1)
Sodium: 140 mmol/L (ref 135–145)
Total Bilirubin: 2.2 mg/dL — ABNORMAL HIGH (ref 0.3–1.2)
Total Protein: 6.1 g/dL — ABNORMAL LOW (ref 6.5–8.1)

## 2019-04-11 NOTE — Progress Notes (Signed)
Consult Note: Gyn-Onc  Consult was requested by Dr. Duanne Guess for the evaluation of Megan Roberson 63 y.o. female  CC:  Chief Complaint  Patient presents with  . Complex Right Ovarian Mass    New patient    Assessment/Plan:  [name] is a 63 y.o. with a 5.1 x 3.8 x 5.2 cm right ovarian cyst on ultrasound.  This motor evaluation is notable for just internal septations without any mural nodularity no internal vascularity.  There has not been appreciable change since the CT findings of September 2019.  An MRI of the pelvis is been ordered to better characterize this adnexal mass.  Megan Roberson is a poor surgical candidate given the history of portal hypertension with bleeding esophageal varices.  MRI will better assist [place] in the evaluation of this adnexal mass and allow [place] to make informed risk-benefit decision making. A 125 is been ordered I expect that this value will be elevated because of the underlying liver cirrhosis.  This value however can be serially followed for changes.  Follow-up May 04, 2019 after MRI to discuss treatment plan  HPI: [name] is a 63 y.o. 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  Her history is notable for a CT of the abdomen and pelvis on July 07, 2018 associated with a fall.  It adnexal mass was noted measuring 5.0 cm.  The left adnexal region was unremarkable and there is no pelvic fluid. Transvaginal sonogram on January 02, 2019 is notable for a uterus measuring 5.2 x 2.8 x 3.4 cm.  It is retroverted and retroflexed atrial thickness is 1 mm.  The right ovary is noted to measure 5.1 x 3.8 x 5.2 cm and replaced by a 5.1 x 3.5 x 4.8 mildly complex cystic mass with thin internal septations.  There is no mural nodularity and no internal vascularity without significant change since July 07, 2018.  The left ovary measures 2.3 x 2.4 x 2.3 cm there is a  simple left ovarian cyst the largest measuring 1.7 x 1.6 x 1.6 cm.  No abnormal free fluid was appreciated.   Review of Systems:  Constitutional  Feels well, Cardiovascular  No chest pain, shortness of breath, or edema  Pulmonary  No cough or wheeze.  Denies sputum or hemoptysis Gastro Intestinal  No nausea, vomitting, or diarrhoea.  Reports intermittent constipation no bright red blood per rectum, no abdominal pain, change in bowel movement,  Genito Urinary  No frequency, urgency, dysuria, no vaginal bleeding or discharge musculo Skeletal  No myalgia, arthralgia, joint swelling or pain  Neurologic  No weakness, numbness, reports frequent falls, denies physical assault at home or by persons outside of the home  Psychology  No depression, anxiety, insomnia.    Current Meds:  Outpatient Encounter Medications as of 04/11/2019  Medication Sig  . furosemide (LASIX) 40 MG tablet Take 1 tablet (40 mg total) by mouth 2 (two) times daily.  Marland Kitchen gabapentin (NEURONTIN) 100 MG capsule Take 2 capsules (200 mg total) by mouth 3 (three) times daily. (Patient taking differently: Take 200 mg by mouth as needed. )  . lactulose (CHRONULAC) 10 GM/15ML solution Take 30 mLs (20 g total) by mouth 3 (three) times daily. (Patient taking differently: Take 20 g by mouth daily as needed. )  . levOCARNitine (CARNITOR) 330 MG tablet Take 330 mg by mouth 3 (three) times daily.  Marland Kitchen levothyroxine (SYNTHROID, LEVOTHROID) 75 MCG tablet  Take 1 tablet (75 mcg total) by mouth daily before breakfast.  . omeprazole (PRILOSEC) 20 MG capsule Take 1 capsule (20 mg total) by mouth 1 day or 1 dose for 1 dose.  . ondansetron (ZOFRAN-ODT) 4 MG disintegrating tablet Take 4 mg by mouth every 8 (eight) hours as needed for nausea or vomiting.  Marland Kitchen QUEtiapine (SEROQUEL) 25 MG tablet Take 25 mg by mouth at bedtime.   . rifaximin (XIFAXAN) 550 MG TABS tablet Take 1 tablet (550 mg total) by mouth 2 (two) times daily.  . sertraline (ZOLOFT) 100  MG tablet Take 100 mg by mouth daily.   Marland Kitchen spironolactone (ALDACTONE) 100 MG tablet Take 2 tablets (200 mg total) by mouth daily.  . traZODone (DESYREL) 50 MG tablet Take 50 mg at bedtime by mouth.   No facility-administered encounter medications on file as of 04/11/2019.     Allergy:  Allergies  Allergen Reactions  . Penicillins Swelling and Other (See Comments)    FACIAL SWELLING  PATIENT HAS HAD A PCN REACTION WITH IMMEDIATE RASH, FACIAL/TONGUE/THROAT SWELLING, SOB, OR LIGHTHEADEDNESS WITH HYPOTENSION:  #  #  #  YES  #  #  #   Has patient had a PCN reaction causing severe rash involving mucus membranes or skin necrosis: No Has patient had a PCN reaction that required hospitalization: No Has patient had a PCN reaction occurring within the last 10 years: No If all of the above answers are "NO", then may proceed with Cephalosporin u  . Tylenol [Acetaminophen] Nausea Only  . Sulfa Antibiotics Itching and Rash  . Tramadol Swelling and Rash    Social Hx:   Social History   Socioeconomic History  . Marital status: Married    Spouse name: Not on file  . Number of children: 2  . Years of education: Not on file  . Highest education level: Not on file  Occupational History  . Occupation: disabled  Social Needs  . Financial resource strain: Not on file  . Food insecurity:    Worry: Not on file    Inability: Not on file  . Transportation needs:    Medical: Not on file    Non-medical: Not on file  Tobacco Use  . Smoking status: Never Smoker  . Smokeless tobacco: Never Used  Substance and Sexual Activity  . Alcohol use: No  . Drug use: No  . Sexual activity: Not Currently    Birth control/protection: Post-menopausal  Lifestyle  . Physical activity:    Days per week: Not on file    Minutes per session: Not on file  . Stress: Not on file  Relationships  . Social connections:    Talks on phone: Not on file    Gets together: Not on file    Attends religious service: Not on  file    Active member of club or organization: Not on file    Attends meetings of clubs or organizations: Not on file    Relationship status: Not on file  . Intimate partner violence:    Fear of current or ex partner: Not on file    Emotionally abused: Not on file    Physically abused: Not on file    Forced sexual activity: Not on file  Other Topics Concern  . Not on file  Social History Narrative  . Not on file    Past Surgical Hx:  Past Surgical History:  Procedure Laterality Date  . AUGMENTATION MAMMAPLASTY Bilateral    20 years ago  .  BIOPSY  03/15/2018   Procedure: BIOPSY;  Surgeon: Yetta Flock, MD;  Location: [facility];  Service: Gastroenterology;;  Gastric  . COLONOSCOPY WITH ESOPHAGOGASTRODUODENOSCOPY (EGD) AND ESOPHAGEAL DILATION (ED)    . COLONOSCOPY WITH PROPOFOL N/A 03/15/2018   Procedure: COLONOSCOPY WITH PROPOFOL;  Surgeon: Yetta Flock, MD;  Location: [facility];  Service: Gastroenterology;  Laterality: N/A;  . ESOPHAGOGASTRODUODENOSCOPY (EGD) WITH PROPOFOL N/A 03/15/2018   Procedure: ESOPHAGOGASTRODUODENOSCOPY (EGD) WITH PROPOFOL;  Surgeon: Yetta Flock, MD;  Location: [facility];  Service: Gastroenterology;  Laterality: N/A;  . ESOPHAGOGASTRODUODENOSCOPY W/ BANDING     Varices  . FRACTURE SURGERY Left    x 2 L elbow and hip left   . HARDWARE REMOVAL Left 09/17/2017   Procedure: HARDWARE REMOVAL LEFT OLECRANON;  Surgeon: Altamese [place], MD;  Location: [facility];  Service: Orthopedics;  Laterality: Left;  . HIP SURGERY Left    from fracture- rod in palce  . IR PARACENTESIS  11/18/2017  . IR PARACENTESIS  11/26/2017  . IR PARACENTESIS  03/14/2018  . KNEE SURGERY Right    open incision for ligaments  . ORIF ELBOW FRACTURE Left 04/30/2017   Procedure: REVISION OPEN REDUCTION INTERNAL FIXATION (ORIF) ELBOW/OLECRANON FRACTURE;  Surgeon: Altamese [place], MD;  Location: [facility];  Service: Orthopedics;  Laterality: Left;    Past Medical Hx:  Past  Medical History:  Diagnosis Date  . Anxiety    pt. denies  . Cirrhosis of liver ([place])   . Depression    pt. denies  . Heart murmur    "not concerned about "   . History of abdominal paracentesis   . History of blood transfusion   . Hypothyroidism   . Ileus ([place])   . Liver disease 2008  . Neuropathy   . Osteoporosis 02/09/2018  . Pancytopenia ([place])   . Pneumonia   . Vaginal Pap smear, abnormal     Past Gynecological History: Gravida 2 para 2 menarche age 5 menopause at 37 use oral contraceptive pills from the age of 89 intermittently until the age of 43.  Reports history of abnormal Pap tests and at least 5 colposcopies.  Pap test on March 2019 within normal limits   Family Hx:  Family History  Adopted: Yes  Problem Relation Age of Onset  . Alcohol abuse Daughter   Megan Roberson is adopted and is not aware of her family history  Vitals:  Blood pressure 125/72, pulse 72, temperature 98 F (36.7 C), temperature source Oral, resp. rate 16, height 5' 6"  (1.676 m), weight 156 lb 11.2 oz (71.1 kg), SpO2 99 %.  Physical Exam: WD in NAD Neck  Supple NROM, without any enlargements.  Lymph Node Survey No cervical supraclavicular or inguinal adenopathy Cardiovascular  Pulse normal rate, regularity and rhythm. S1 and S2 normal.  Lungs  Clear to auscultation bilaterally, without wheezes/crackles/rhonchi. Good air movement.  Skin  No rash/lesions/breakdown significant bruising on the right forearm right lateral ankle right lateral thigh Psychiatry  Alert and oriented appropriate mood affect speech and reasoning. Abdomen  Normoactive bowel sounds, abdomen soft, non-tender.  Abdomen is distended without evidence of ascites  Back No CVA tenderness Genito Urinary  Vulva/vagina: Normal external female genitalia.  No lesions. No discharge or bleeding.  Bladder/urethra:  No lesions or masses  Vagina: No palpable masses   Uterus: Mobile, no parametrial involvement or  nodularity.  Adnexa: Unable to assess because of body habitus, however no palpable masses nodularity  Extremities  No bilateral cyanosis, clubbing  or edema.   Janie Morning, MD, PhD 04/11/2019, 11:18 AM

## 2019-04-11 NOTE — Patient Instructions (Signed)
Plan to have an MRI of the pelvis then follow up after to discuss options.

## 2019-04-12 ENCOUNTER — Ambulatory Visit (HOSPITAL_COMMUNITY): Payer: Medicare HMO

## 2019-04-12 LAB — CA 125: Cancer Antigen (CA) 125: 22.5 U/mL (ref 0.0–38.1)

## 2019-04-14 ENCOUNTER — Other Ambulatory Visit: Payer: Medicare HMO

## 2019-04-14 ENCOUNTER — Inpatient Hospital Stay: Admission: RE | Admit: 2019-04-14 | Payer: Medicare HMO | Source: Ambulatory Visit

## 2019-04-17 ENCOUNTER — Ambulatory Visit (HOSPITAL_COMMUNITY)
Admission: RE | Admit: 2019-04-17 | Discharge: 2019-04-17 | Disposition: A | Discharge status: Home / Self Care | Payer: Medicare HMO | Source: Ambulatory Visit | Attending: Gynecologic Oncology | Admitting: Gynecologic Oncology

## 2019-04-17 ENCOUNTER — Other Ambulatory Visit: Payer: Self-pay

## 2019-04-17 DIAGNOSIS — N951 Menopausal and female climacteric states: Secondary | ICD-10-CM | POA: Diagnosis not present

## 2019-04-17 DIAGNOSIS — N838 Other noninflammatory disorders of ovary, fallopian tube and broad ligament: Secondary | ICD-10-CM | POA: Diagnosis not present

## 2019-04-17 DIAGNOSIS — N83291 Other ovarian cyst, right side: Secondary | ICD-10-CM | POA: Diagnosis not present

## 2019-04-17 DIAGNOSIS — N83292 Other ovarian cyst, left side: Secondary | ICD-10-CM | POA: Diagnosis not present

## 2019-04-17 MED ORDER — GADOBUTROL 1 MMOL/ML IV SOLN
7.0000 mL | Freq: Once | INTRAVENOUS | Status: AC | PRN
  Administered 2019-04-17: 7 mL via INTRAVENOUS

## 2019-04-18 ENCOUNTER — Encounter (HOSPITAL_COMMUNITY): Admission: RE | Admit: 2019-04-18 | Payer: Medicare HMO | Source: Ambulatory Visit

## 2019-04-19 ENCOUNTER — Ambulatory Visit (HOSPITAL_COMMUNITY): Payer: Medicare HMO

## 2019-04-20 ENCOUNTER — Ambulatory Visit (HOSPITAL_COMMUNITY)
Admission: RE | Admit: 2019-04-20 | Discharge: 2019-04-20 | Disposition: A | Discharge status: Home / Self Care | Payer: Medicare HMO | Source: Ambulatory Visit | Attending: Adult Health | Admitting: Adult Health

## 2019-04-20 ENCOUNTER — Other Ambulatory Visit: Payer: Self-pay

## 2019-04-20 ENCOUNTER — Encounter (HOSPITAL_COMMUNITY)
Admission: RE | Admit: 2019-04-20 | Discharge: 2019-04-20 | Disposition: A | Discharge status: Home / Self Care | Payer: Medicare HMO | Source: Ambulatory Visit | Attending: Internal Medicine | Admitting: Internal Medicine

## 2019-04-20 DIAGNOSIS — Z1231 Encounter for screening mammogram for malignant neoplasm of breast: Secondary | ICD-10-CM | POA: Diagnosis not present

## 2019-04-20 DIAGNOSIS — D509 Iron deficiency anemia, unspecified: Secondary | ICD-10-CM | POA: Diagnosis not present

## 2019-04-20 MED ORDER — DENOSUMAB 60 MG/ML ~~LOC~~ SOSY
60.0000 mg | PREFILLED_SYRINGE | Freq: Once | SUBCUTANEOUS | Status: AC
  Administered 2019-04-20: 60 mg via SUBCUTANEOUS

## 2019-04-24 ENCOUNTER — Ambulatory Visit
Admission: RE | Admit: 2019-04-24 | Discharge: 2019-04-24 | Disposition: A | Discharge status: Home / Self Care | Payer: Medicare HMO | Source: Ambulatory Visit | Attending: Gastroenterology | Admitting: Gastroenterology

## 2019-04-24 DIAGNOSIS — K7460 Unspecified cirrhosis of liver: Secondary | ICD-10-CM | POA: Diagnosis not present

## 2019-04-24 DIAGNOSIS — N200 Calculus of kidney: Secondary | ICD-10-CM | POA: Diagnosis not present

## 2019-04-24 DIAGNOSIS — K8020 Calculus of gallbladder without cholecystitis without obstruction: Secondary | ICD-10-CM | POA: Diagnosis not present

## 2019-04-24 DIAGNOSIS — D649 Anemia, unspecified: Secondary | ICD-10-CM

## 2019-04-24 HISTORY — PX: OTHER SURGICAL HISTORY: SHX169

## 2019-04-26 NOTE — Addendum Note (Signed)
Addended by: Yetta Flock on: 04/26/2019 02:36 PM   Modules accepted: Level of Service

## 2019-05-01 ENCOUNTER — Other Ambulatory Visit: Payer: Medicare HMO

## 2019-05-02 ENCOUNTER — Other Ambulatory Visit: Payer: Self-pay

## 2019-05-02 ENCOUNTER — Ambulatory Visit (HOSPITAL_COMMUNITY)
Admission: RE | Admit: 2019-05-02 | Discharge: 2019-05-02 | Disposition: A | Discharge status: Home / Self Care | Payer: Medicare HMO | Source: Ambulatory Visit | Attending: Physician Assistant | Admitting: Physician Assistant

## 2019-05-02 DIAGNOSIS — Z9889 Other specified postprocedural states: Secondary | ICD-10-CM

## 2019-05-02 DIAGNOSIS — Z9181 History of falling: Secondary | ICD-10-CM | POA: Diagnosis not present

## 2019-05-02 DIAGNOSIS — R0789 Other chest pain: Secondary | ICD-10-CM | POA: Diagnosis not present

## 2019-05-03 NOTE — Progress Notes (Deleted)
Consult Note: Gyn-Onc  Consult was requested by Dr. Duanne Guess for the evaluation of Megan Roberson 63 y.o. female  CC:  No chief complaint on file.   Assessment/Plan:  Megan Roberson is a 63 y.o. with a 5.1 x 3.8 x 5.2 cm right ovarian cyst on ultrasound.  This motor evaluation is notable for just internal septations without any mural nodularity no internal vascularity.  There has not been appreciable change since the CT findings of September 2019.  An MRI of the pelvis is been ordered to better characterize this adnexal mass.  Megan Roberson is a poor surgical candidate given the history of portal hypertension with bleeding esophageal varices.  MRI will better assist [place] in the evaluation of this adnexal mass and allow [place] to make informed risk-benefit decision making. A 125 is been ordered I expect that this value will be elevated because of the underlying liver cirrhosis.  This value however can be serially followed for changes.  Follow-up May 04, 2019 after MRI to discuss treatment plan  HPI: Megan Roberson is a 63 y.o. 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  Her history is notable for a CT of the abdomen and pelvis on July 07, 2018 associated with a fall.  It adnexal mass was noted measuring 5.0 cm.  The left adnexal region was unremarkable and there is no pelvic fluid. Transvaginal sonogram on January 02, 2019 is notable for a uterus measuring 5.2 x 2.8 x 3.4 cm.  It is retroverted and retroflexed atrial thickness is 1 mm.  The right ovary is noted to measure 5.1 x 3.8 x 5.2 cm and replaced by a 5.1 x 3.5 x 4.8 mildly complex cystic mass with thin internal septations.  There is no mural nodularity and no internal vascularity without significant change since July 07, 2018.  The left ovary measures 2.3 x 2.4 x 2.3 cm there is a simple left ovarian cyst the largest measuring 1.7 x 1.6 x 1.6  cm.  No abnormal free fluid was appreciated.   Review of Systems:  Constitutional  Feels well, Cardiovascular  No chest pain, shortness of breath, or edema  Pulmonary  No cough or wheeze.  Denies sputum or hemoptysis Gastro Intestinal  No nausea, vomitting, or diarrhoea.  Reports intermittent constipation no bright red blood per rectum, no abdominal pain, change in bowel movement,  Genito Urinary  No frequency, urgency, dysuria, no vaginal bleeding or discharge musculo Skeletal  No myalgia, arthralgia, joint swelling or pain  Neurologic  No weakness, numbness, reports frequent falls, denies physical assault at home or by persons outside of the home  Psychology  No depression, anxiety, insomnia.    Current Meds:  Outpatient Encounter Medications as of 05/04/2019  Medication Sig  . denosumab (PROLIA) 60 MG/ML SOSY injection Inject 60 mg into the skin every 6 (six) months.  . furosemide (LASIX) 40 MG tablet Take 1 tablet (40 mg total) by mouth 2 (two) times daily.  Marland Kitchen gabapentin (NEURONTIN) 100 MG capsule Take 2 capsules (200 mg total) by mouth 3 (three) times daily. (Patient taking differently: Take 200 mg by mouth as needed. )  . lactulose (CHRONULAC) 10 GM/15ML solution Take 30 mLs (20 g total) by mouth 3 (three) times daily. (Patient taking differently: Take 20 g by mouth daily as needed. )  . levOCARNitine (CARNITOR) 330 MG tablet Take 330 mg by mouth 3 (three) times daily.  Marland Kitchen levothyroxine (  SYNTHROID, LEVOTHROID) 75 MCG tablet Take 1 tablet (75 mcg total) by mouth daily before breakfast.  . omeprazole (PRILOSEC) 20 MG capsule Take 1 capsule (20 mg total) by mouth 1 day or 1 dose for 1 dose.  . ondansetron (ZOFRAN-ODT) 4 MG disintegrating tablet Take 4 mg by mouth every 8 (eight) hours as needed for nausea or vomiting.  Marland Kitchen QUEtiapine (SEROQUEL) 25 MG tablet Take 25 mg by mouth at bedtime.   . rifaximin (XIFAXAN) 550 MG TABS tablet Take 1 tablet (550 mg total) by mouth 2 (two) times  daily.  . sertraline (ZOLOFT) 100 MG tablet Take 100 mg by mouth daily.   Marland Kitchen spironolactone (ALDACTONE) 100 MG tablet Take 2 tablets (200 mg total) by mouth daily.  . traZODone (DESYREL) 50 MG tablet Take 50 mg at bedtime by mouth.   No facility-administered encounter medications on file as of 05/04/2019.     Allergy:  Allergies  Allergen Reactions  . Penicillins Swelling and Other (See Comments)    FACIAL SWELLING  PATIENT HAS HAD A PCN REACTION WITH IMMEDIATE RASH, FACIAL/TONGUE/THROAT SWELLING, SOB, OR LIGHTHEADEDNESS WITH HYPOTENSION:  #  #  #  YES  #  #  #   Has patient had a PCN reaction causing severe rash involving mucus membranes or skin necrosis: No Has patient had a PCN reaction that required hospitalization: No Has patient had a PCN reaction occurring within the last 10 years: No If all of the above answers are "NO", then may proceed with Cephalosporin u  . Tylenol [Acetaminophen] Nausea Only  . Sulfa Antibiotics Itching and Rash  . Tramadol Swelling and Rash    Social Hx:   Social History   Socioeconomic History  . Marital status: Married    Spouse name: Not on file  . Number of children: 2  . Years of education: Not on file  . Highest education level: Not on file  Occupational History  . Occupation: disabled  Social Needs  . Financial resource strain: Not on file  . Food insecurity    Worry: Not on file    Inability: Not on file  . Transportation needs    Medical: Not on file    Non-medical: Not on file  Tobacco Use  . Smoking status: Never Smoker  . Smokeless tobacco: Never Used  Substance and Sexual Activity  . Alcohol use: No  . Drug use: No  . Sexual activity: Not Currently    Birth control/protection: Post-menopausal  Lifestyle  . Physical activity    Days per week: Not on file    Minutes per session: Not on file  . Stress: Not on file  Relationships  . Social Herbalist on phone: Not on file    Gets together: Not on file     Attends religious service: Not on file    Active member of club or organization: Not on file    Attends meetings of clubs or organizations: Not on file    Relationship status: Not on file  . Intimate partner violence    Fear of current or ex partner: Not on file    Emotionally abused: Not on file    Physically abused: Not on file    Forced sexual activity: Not on file  Other Topics Concern  . Not on file  Social History Narrative  . Not on file    Past Surgical Hx:  Past Surgical History:  Procedure Laterality Date  . AUGMENTATION MAMMAPLASTY Bilateral  20 years ago  . BIOPSY  03/15/2018   Procedure: BIOPSY;  Surgeon: Yetta Flock, MD;  [facility];  Service: Gastroenterology;;  Gastric  . COLONOSCOPY WITH ESOPHAGOGASTRODUODENOSCOPY (EGD) AND ESOPHAGEAL DILATION (ED)    . COLONOSCOPY WITH PROPOFOL N/A 03/15/2018   Procedure: COLONOSCOPY WITH PROPOFOL;  Surgeon: Yetta Flock, MD;  Location: [facility];  Service: Gastroenterology;  Laterality: N/A;  . ESOPHAGOGASTRODUODENOSCOPY (EGD) WITH PROPOFOL N/A 03/15/2018   Procedure: ESOPHAGOGASTRODUODENOSCOPY (EGD) WITH PROPOFOL;  Surgeon: Yetta Flock, MD;  Location: [facility];  Service: Gastroenterology;  Laterality: N/A;  . ESOPHAGOGASTRODUODENOSCOPY W/ BANDING     Varices  . FRACTURE SURGERY Left    x 2 L elbow and hip left   . HARDWARE REMOVAL Left 09/17/2017   Procedure: HARDWARE REMOVAL LEFT OLECRANON;  Surgeon: Altamese [place], MD;  Location: [facility];  Service: Orthopedics;  Laterality: Left;  . HIP SURGERY Left    from fracture- rod in palce  . IR PARACENTESIS  11/18/2017  . IR PARACENTESIS  11/26/2017  . IR PARACENTESIS  03/14/2018  . KNEE SURGERY Right    open incision for ligaments  . ORIF ELBOW FRACTURE Left 04/30/2017   Procedure: REVISION OPEN REDUCTION INTERNAL FIXATION (ORIF) ELBOW/OLECRANON FRACTURE;  Surgeon: Altamese [place], MD;  Location: [facility];  Service: Orthopedics;  Laterality:  Left;    Past Medical Hx:  Past Medical History:  Diagnosis Date  . Anxiety    pt. denies  . Cirrhosis of liver ([place])   . Depression    pt. denies  . Heart murmur    "not concerned about "   . History of abdominal paracentesis   . History of blood transfusion   . Hypothyroidism   . Ileus ([place])   . Liver disease 2008  . Neuropathy   . Osteoporosis 02/09/2018  . Pancytopenia ([place])   . Pneumonia   . Vaginal Pap smear, abnormal     Past Gynecological History: Gravida 2 para 2 menarche age 50 menopause at 25 use oral contraceptive pills from the age of 89 intermittently until the age of 66.  Reports history of abnormal Pap tests and at least 5 colposcopies.  Pap test on March 2019 within normal limits   Family Hx:  Family History  Adopted: Yes  Problem Relation Age of Onset  . Alcohol abuse Daughter   Cloria Ciresi is adopted and is not aware of her family history  Vitals:  There were no vitals taken for this visit.  Physical Exam: WD in NAD Neck  Supple NROM, without any enlargements.  Lymph Node Survey No cervical supraclavicular or inguinal adenopathy Cardiovascular  Pulse normal rate, regularity and rhythm. S1 and S2 normal.  Lungs  Clear to auscultation bilaterally, without wheezes/crackles/rhonchi. Good air movement.  Skin  No rash/lesions/breakdown significant bruising on the right forearm right lateral ankle right lateral thigh Psychiatry  Alert and oriented appropriate mood affect speech and reasoning. Abdomen  Normoactive bowel sounds, abdomen soft, non-tender.  Abdomen is distended without evidence of ascites  Back No CVA tenderness Genito Urinary  Vulva/vagina: Normal external female genitalia.  No lesions. No discharge or bleeding.  Bladder/urethra:  No lesions or masses  Vagina: No palpable masses   Uterus: Mobile, no parametrial involvement or nodularity.  Adnexa: Unable to assess because of body habitus, however no palpable masses nodularity   Extremities  No bilateral cyanosis, clubbing or edema.

## 2019-05-04 ENCOUNTER — Ambulatory Visit: Payer: Medicare HMO | Admitting: Gynecologic Oncology

## 2019-05-08 ENCOUNTER — Ambulatory Visit (HOSPITAL_COMMUNITY)
Admission: RE | Admit: 2019-05-08 | Discharge: 2019-05-08 | Disposition: A | Discharge status: Home / Self Care | Payer: Medicare HMO | Source: Ambulatory Visit | Attending: Family Medicine | Admitting: Family Medicine

## 2019-05-08 ENCOUNTER — Other Ambulatory Visit (HOSPITAL_COMMUNITY): Payer: Self-pay | Admitting: Family Medicine

## 2019-05-08 ENCOUNTER — Other Ambulatory Visit: Payer: Self-pay

## 2019-05-08 DIAGNOSIS — W19XXXA Unspecified fall, initial encounter: Secondary | ICD-10-CM | POA: Insufficient documentation

## 2019-05-08 DIAGNOSIS — R079 Chest pain, unspecified: Secondary | ICD-10-CM | POA: Diagnosis not present

## 2019-05-08 DIAGNOSIS — J9811 Atelectasis: Secondary | ICD-10-CM | POA: Insufficient documentation

## 2019-05-08 DIAGNOSIS — Y92003 Bedroom of unspecified non-institutional (private) residence as the place of occurrence of the external cause: Secondary | ICD-10-CM | POA: Diagnosis not present

## 2019-05-15 DIAGNOSIS — Z9181 History of falling: Secondary | ICD-10-CM | POA: Diagnosis not present

## 2019-05-15 DIAGNOSIS — R0789 Other chest pain: Secondary | ICD-10-CM | POA: Diagnosis not present

## 2019-05-16 IMAGING — DX DG ELBOW 2V*L*
2 series · 2 of 2 positions shown · non-contrast
Comparison: Prior images same date.

CLINICAL DATA: ORIF.

EXAM:
LEFT ELBOW - 2 VIEW

[elbow ap]
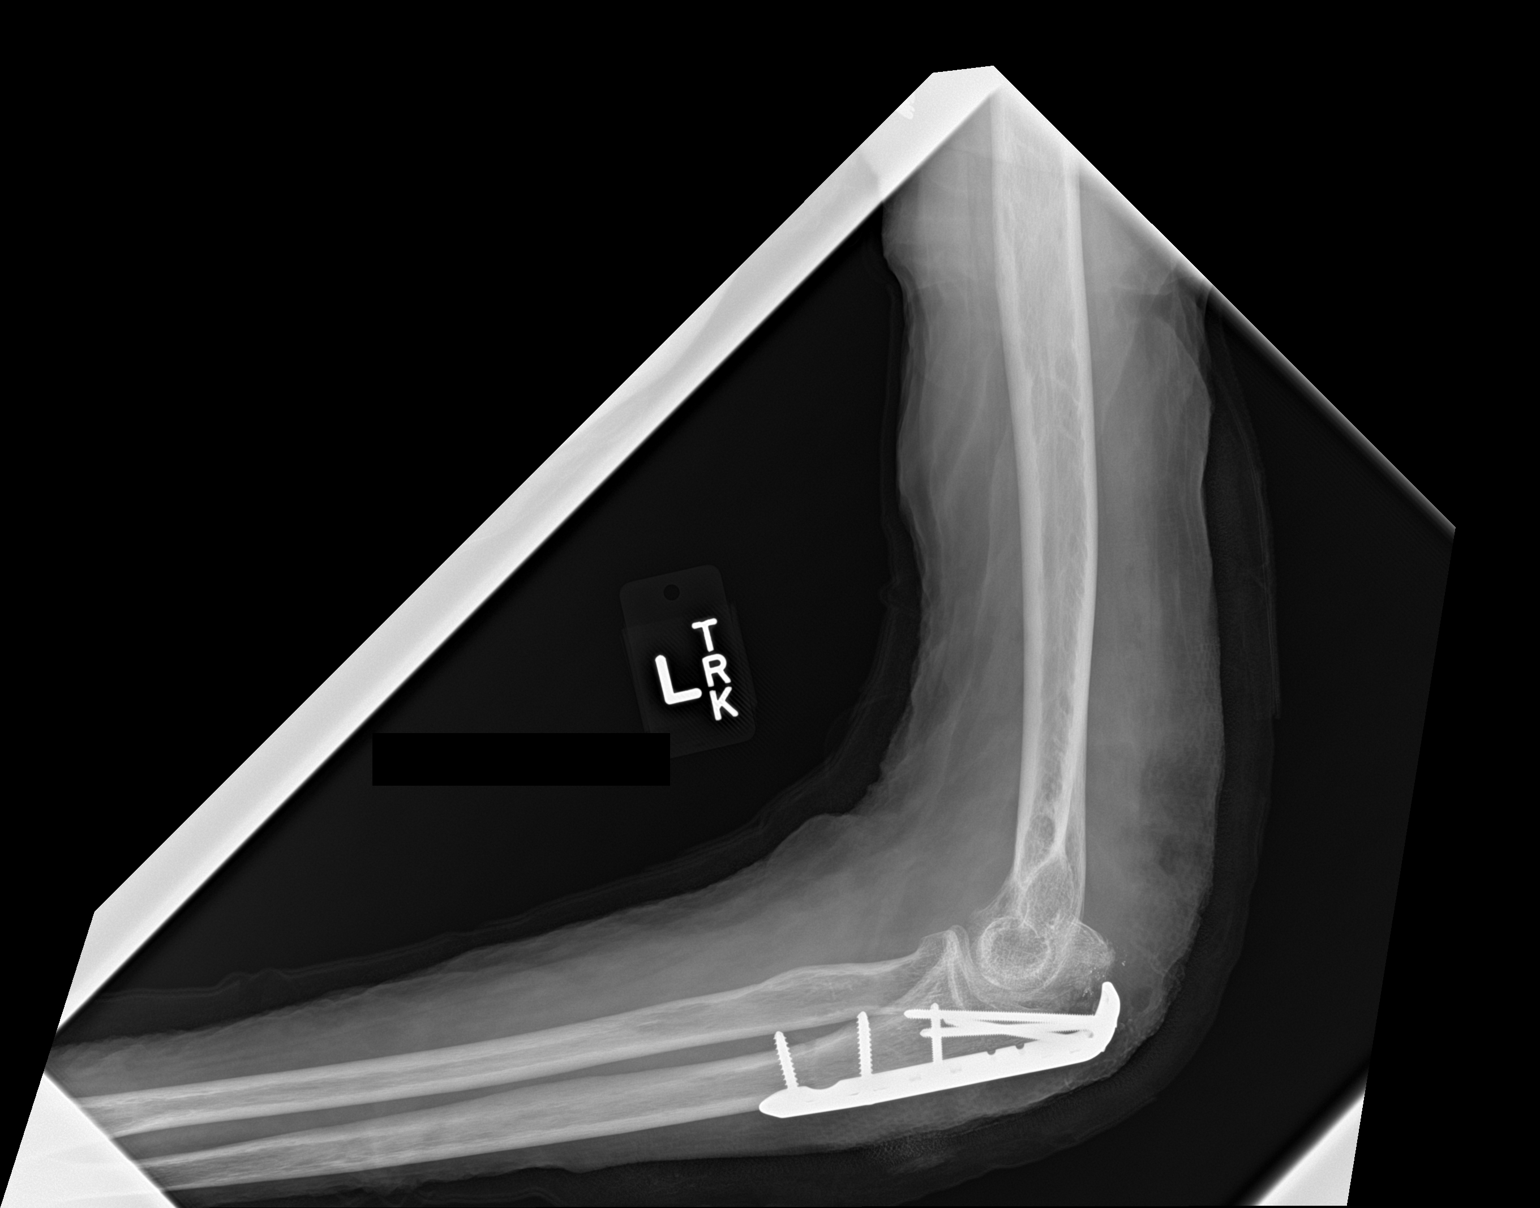

[elbow lat]
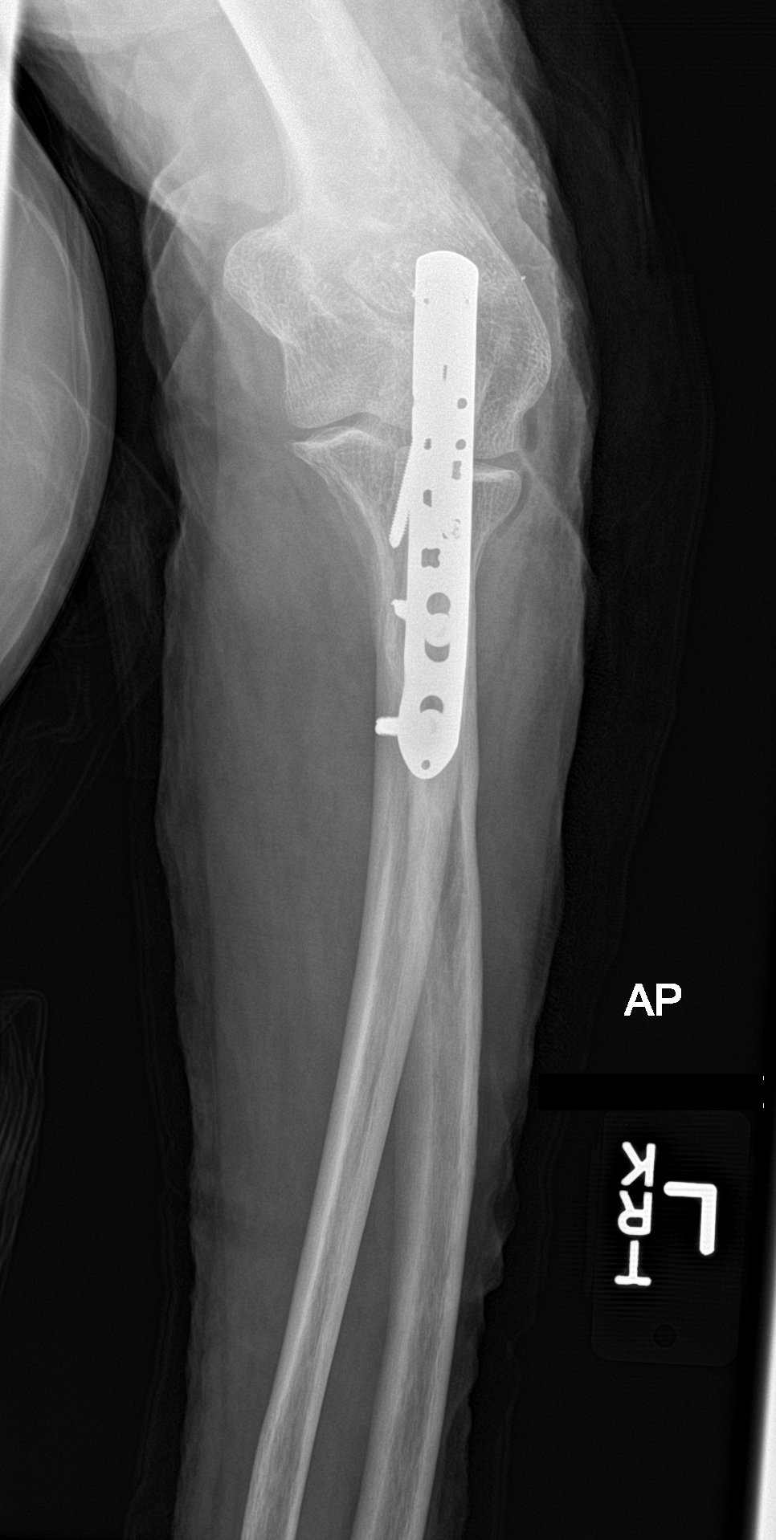

[2 of 2 positions shown; findings below may reference images not displayed]

FINDINGS: ORIF proximal ulna. Hardware intact. Olecranon fracture fragment is
in stable position as on intraoperative exam.
IMPRESSION: ORIF proximal ulna.

## 2019-06-15 ENCOUNTER — Other Ambulatory Visit: Payer: Self-pay

## 2019-06-15 ENCOUNTER — Inpatient Hospital Stay: Payer: Medicare HMO | Attending: Gynecologic Oncology | Admitting: Gynecologic Oncology

## 2019-06-15 VITALS — BP 107/71 | HR 83 | Temp 98.7°F | Resp 20 | Ht 66.0 in | Wt 150.0 lb

## 2019-06-15 DIAGNOSIS — Z79899 Other long term (current) drug therapy: Secondary | ICD-10-CM | POA: Insufficient documentation

## 2019-06-15 DIAGNOSIS — N83202 Unspecified ovarian cyst, left side: Secondary | ICD-10-CM | POA: Diagnosis not present

## 2019-06-15 DIAGNOSIS — R188 Other ascites: Secondary | ICD-10-CM

## 2019-06-15 DIAGNOSIS — N83201 Unspecified ovarian cyst, right side: Secondary | ICD-10-CM | POA: Diagnosis not present

## 2019-06-15 DIAGNOSIS — F419 Anxiety disorder, unspecified: Secondary | ICD-10-CM | POA: Insufficient documentation

## 2019-06-15 DIAGNOSIS — K7031 Alcoholic cirrhosis of liver with ascites: Secondary | ICD-10-CM

## 2019-06-15 DIAGNOSIS — R011 Cardiac murmur, unspecified: Secondary | ICD-10-CM | POA: Diagnosis not present

## 2019-06-15 DIAGNOSIS — E039 Hypothyroidism, unspecified: Secondary | ICD-10-CM | POA: Insufficient documentation

## 2019-06-15 DIAGNOSIS — M810 Age-related osteoporosis without current pathological fracture: Secondary | ICD-10-CM | POA: Diagnosis not present

## 2019-06-15 DIAGNOSIS — N949 Unspecified condition associated with female genital organs and menstrual cycle: Secondary | ICD-10-CM

## 2019-06-15 DIAGNOSIS — F329 Major depressive disorder, single episode, unspecified: Secondary | ICD-10-CM | POA: Insufficient documentation

## 2019-06-15 NOTE — Progress Notes (Signed)
GYN ONCOLOGY OFFICE VISIT   Referring clinician:   Dr. Duanne Guess (Gastroenterologist)   CC:  Bilateral ovarian cysts  Assessment/Plan:  Megan Roberson is a 63 y.o. with a 5.1 x 3.8 x 5.2 cm right ovarian cyst on ultrasound.  This motor evaluation is notable for just internal septations without any mural nodularity no internal vascularity.  There has not been appreciable change since the CT findings of September 2019.  An MRI of the pelvis was ordered to better characterize this right adnexal mass.  The right ovarian mass is cystic with multiple thin internal septations and measures 4.8 x 3.5 x 3.4 cm.  The internal septations showed mild contrast-enhancement but no thickened septations or solid mural's.  It was mildly increased from 3.4 cm on November 28, 2017 by CT examination.  The left ovary had complex cystic lesions containing several thin enhancing internal septations measuring 2.4 x 2.0 x 1.6 cm with no evidence of thickened septations or solid mural's.  This was new compared to an earlier CT on November 28, 2017  The options presented were 1 laparoscopic bilateral salpingo-oophorectomy with medical clearance from Dr. Luetta Nutting sister gastroenterologist and medical assessment determine the risks and benefits.  The other option is to continue to follow until this mass measures 7 cm, the Ca125 increases or there is pain or other symptomatology.  [name] understands that the imaging and serologic testing are not a guarantee that this is not malignant. She is quite hesitant to avoid any further morbidity.  If paracentesis is collected please send for cytology  Follow-up in 6 months.  Repeat MRI prior to that visit.  HPI: Megan Roberson is a 63 y.o. 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  Her history is notable for a CT of the abdomen and pelvis on July 07, 2018 associated with a fall.  It  adnexal mass was noted measuring 5.0 cm.  The left adnexal region was unremarkable and there is no pelvic fluid. Transvaginal sonogram on January 02, 2019 is notable for a uterus measuring 5.2 x 2.8 x 3.4 cm.  It is retroverted and retroflexed atrial thickness is 1 mm.  The right ovary is noted to measure 5.1 x 3.8 x 5.2 cm and replaced by a 5.1 x 3.5 x 4.8 mildly complex cystic mass with thin internal septations.  There is no mural nodularity and no internal vascularity without significant change since July 07, 2018.  The left ovary measures 2.3 x 2.4 x 2.3 cm there is a simple left ovarian cyst the largest measuring 1.7 x 1.6 x 1.6 cm.  No abnormal free fluid was appreciated.   MRI 04/2019 -- Uterus: Measures 5.6 x 2.5 by 4.1 cm (volume = 30 cm^3). Retroverted. No fibroids or other masses identified. Thin endometrium. Cervix and vagina are unremarkable.  -- Right ovary: A cystic lesion with multiple thin internal septations is seen, which measures 4.8 x 3.5 x 3.4 cm. Internal septation show mild contrast enhancement, but no thickened septations or solid mural nodules visualized. This shows mild increase in size from 3.4 cm on 11/28/2017 CT.  -- Left ovary: Complex cystic lesion containing several thin enhancing internal septations. This measures 2.4 x 2.0 x 1.6 cm, and shows no evidence of thickened septations or solid mural nodules. This appears new compared with earlier CT on 11/28/2017.  Other: No peritoneal thickening or abnormal free fluid  Review of Systems:  Constitutional  Feels well, Cardiovascular  No chest pain, shortness of breath, or edema  Pulmonary  No cough or wheeze.  Denies sputum or hemoptysis Gastro Intestinal  No nausea, vomitting, or diarrhoea.  Reports intermittent constipation no bright red blood per rectum, no abdominal pain, change in bowel movement,  Genito Urinary  No frequency, urgency, dysuria, no vaginal bleeding or discharge musculo Skeletal  No myalgia,  arthralgia, joint swelling or pain  Neurologic  No weakness, numbness, reports frequent falls, denies physical assault at home or by persons outside of the home  Psychology  No depression, anxiety, insomnia.    Current Meds:  Outpatient Encounter Medications as of 06/15/2019  Medication Sig  . denosumab (PROLIA) 60 MG/ML SOSY injection Inject 60 mg into the skin every 6 (six) months.  . furosemide (LASIX) 40 MG tablet Take 1 tablet (40 mg total) by mouth 2 (two) times daily.  Marland Kitchen gabapentin (NEURONTIN) 100 MG capsule Take 2 capsules (200 mg total) by mouth 3 (three) times daily. (Patient taking differently: Take 200 mg by mouth as needed. )  . lactulose (CHRONULAC) 10 GM/15ML solution Take 30 mLs (20 g total) by mouth 3 (three) times daily. (Patient taking differently: Take 20 g by mouth daily as needed. )  . levOCARNitine (CARNITOR) 330 MG tablet Take 330 mg by mouth 3 (three) times daily.  Marland Kitchen levothyroxine (SYNTHROID, LEVOTHROID) 75 MCG tablet Take 1 tablet (75 mcg total) by mouth daily before breakfast.  . omeprazole (PRILOSEC) 20 MG capsule Take 1 capsule (20 mg total) by mouth 1 day or 1 dose for 1 dose.  . ondansetron (ZOFRAN-ODT) 4 MG disintegrating tablet Take 4 mg by mouth every 8 (eight) hours as needed for nausea or vomiting.  Marland Kitchen QUEtiapine (SEROQUEL) 25 MG tablet Take 25 mg by mouth at bedtime.   . rifaximin (XIFAXAN) 550 MG TABS tablet Take 1 tablet (550 mg total) by mouth 2 (two) times daily.  . sertraline (ZOLOFT) 100 MG tablet Take 100 mg by mouth daily.   Marland Kitchen spironolactone (ALDACTONE) 100 MG tablet Take 2 tablets (200 mg total) by mouth daily.  . traZODone (DESYREL) 50 MG tablet Take 50 mg at bedtime by mouth.   No facility-administered encounter medications on file as of 06/15/2019.     Allergy:  Allergies  Allergen Reactions  . Penicillins Swelling and Other (See Comments)    FACIAL SWELLING  PATIENT HAS HAD A PCN REACTION WITH IMMEDIATE RASH, FACIAL/TONGUE/THROAT  SWELLING, SOB, OR LIGHTHEADEDNESS WITH HYPOTENSION:  #  #  #  YES  #  #  #   Has patient had a PCN reaction causing severe rash involving mucus membranes or skin necrosis: No Has patient had a PCN reaction that required hospitalization: No Has patient had a PCN reaction occurring within the last 10 years: No If all of the above answers are "NO", then may proceed with Cephalosporin u  . Tylenol [Acetaminophen] Nausea Only  . Sulfa Antibiotics Itching and Rash  . Tramadol Swelling and Rash    Social Hx:   Social History   Socioeconomic History  . Marital status: Married    Spouse name: Not on file  . Number of children: 2  . Years of education: Not on file  . Highest education level: Not on file  Occupational History  . Occupation: disabled  Social Needs  . Financial resource strain: Not on file  . Food insecurity    Worry: Not on file    Inability: Not on file  .  Transportation needs    Medical: Not on file    Non-medical: Not on file  Tobacco Use  . Smoking status: Never Smoker  . Smokeless tobacco: Never Used  Substance and Sexual Activity  . Alcohol use: No  . Drug use: No  . Sexual activity: Not Currently    Birth control/protection: Post-menopausal  Lifestyle  . Physical activity    Days per week: Not on file    Minutes per session: Not on file  . Stress: Not on file  Relationships  . Social Herbalist on phone: Not on file    Gets together: Not on file    Attends religious service: Not on file    Active member of club or organization: Not on file    Attends meetings of clubs or organizations: Not on file    Relationship status: Not on file  . Intimate partner violence    Fear of current or ex partner: Not on file    Emotionally abused: Not on file    Physically abused: Not on file    Forced sexual activity: Not on file  Other Topics Concern  . Not on file  Social History Narrative  . Not on file    Past Surgical Hx:  Past Surgical History:   Procedure Laterality Date  . AUGMENTATION MAMMAPLASTY Bilateral    20 years ago  . BIOPSY  03/15/2018   Procedure: BIOPSY;  Surgeon: Yetta Flock, MD;  Location: [facility];  Service: Gastroenterology;;  Gastric  . COLONOSCOPY WITH ESOPHAGOGASTRODUODENOSCOPY (EGD) AND ESOPHAGEAL DILATION (ED)    . COLONOSCOPY WITH PROPOFOL N/A 03/15/2018   Procedure: COLONOSCOPY WITH PROPOFOL;  Surgeon: Yetta Flock, MD;  Location: [facility];  Service: Gastroenterology;  Laterality: N/A;  . ESOPHAGOGASTRODUODENOSCOPY (EGD) WITH PROPOFOL N/A 03/15/2018   Procedure: ESOPHAGOGASTRODUODENOSCOPY (EGD) WITH PROPOFOL;  Surgeon: Yetta Flock, MD;  Location: [facility];  Service: Gastroenterology;  Laterality: N/A;  . ESOPHAGOGASTRODUODENOSCOPY W/ BANDING     Varices  . FRACTURE SURGERY Left    x 2 L elbow and hip left   . HARDWARE REMOVAL Left 09/17/2017   Procedure: HARDWARE REMOVAL LEFT OLECRANON;  Surgeon: Altamese Logan, MD;  Location: [place];  Service: Orthopedics;  Laterality: Left;  . HIP SURGERY Left    from fracture- rod in palce  . IR PARACENTESIS  11/18/2017  . IR PARACENTESIS  11/26/2017  . IR PARACENTESIS  03/14/2018  . KNEE SURGERY Right    open incision for ligaments  . ORIF ELBOW FRACTURE Left 04/30/2017   Procedure: REVISION OPEN REDUCTION INTERNAL FIXATION (ORIF) ELBOW/OLECRANON FRACTURE;  Surgeon: Altamese , MD;  Location: [place];  Service: Orthopedics;  Laterality: Left;    Past Medical Hx:  Past Medical History:  Diagnosis Date  . Anxiety    pt. denies  . Cirrhosis of liver ([place])   . Depression    pt. denies  . Heart murmur    "not concerned about "   . History of abdominal paracentesis   . History of blood transfusion   . Hypothyroidism   . Ileus ([place])   . Liver disease 2008  . Neuropathy   . Osteoporosis 02/09/2018  . Pancytopenia ([place])   . Pneumonia   . Vaginal Pap smear, abnormal     Past Gynecological History: Gravida 2 para 2 menarche age  89 menopause at 79 use oral contraceptive pills from the age of 73 intermittently until the age of 60.  Reports history of  abnormal Pap tests and at least 5 colposcopies.  Pap test on March 2019 within normal limits   Family Hx:  Family History  Adopted: Yes  Problem Relation Age of Onset  . Alcohol abuse Daughter   Aishah Teffeteller is adopted and is not aware of her family history  Vitals:  Blood pressure 107/71, pulse 83, temperature 98.7 F (37.1 C), temperature source Temporal, resp. rate 20, height 5' 6"  (1.676 m), weight 150 lb (68 kg).  Physical Exam: WD in NAD Neck  Supple NROM, without any enlargements.    Janie Morning, MD, PhD 06/15/2019, 4:00 PM

## 2019-06-15 NOTE — Patient Instructions (Signed)
Plan to have an MRI in six months with a follow up appointment after to follow the ovarian cyst.  Please call closer to the date of your MRI at [phone] to schedule your office visit since our clinic schedule is not out yet.  Please call for any questions or concerns.

## 2019-06-20 ENCOUNTER — Encounter: Payer: Self-pay | Admitting: Gastroenterology

## 2019-06-20 DIAGNOSIS — I8500 Esophageal varices without bleeding: Secondary | ICD-10-CM | POA: Diagnosis not present

## 2019-06-20 DIAGNOSIS — R188 Other ascites: Secondary | ICD-10-CM | POA: Diagnosis not present

## 2019-06-20 DIAGNOSIS — K7030 Alcoholic cirrhosis of liver without ascites: Secondary | ICD-10-CM | POA: Diagnosis not present

## 2019-06-20 DIAGNOSIS — K7290 Hepatic failure, unspecified without coma: Secondary | ICD-10-CM | POA: Diagnosis not present

## 2019-06-29 ENCOUNTER — Other Ambulatory Visit: Payer: Self-pay | Admitting: Nurse Practitioner

## 2019-06-29 DIAGNOSIS — K7469 Other cirrhosis of liver: Secondary | ICD-10-CM

## 2019-07-07 ENCOUNTER — Ambulatory Visit
Admission: RE | Admit: 2019-07-07 | Discharge: 2019-07-07 | Disposition: A | Discharge status: Home / Self Care | Payer: Medicare HMO | Source: Ambulatory Visit | Attending: Nurse Practitioner | Admitting: Nurse Practitioner

## 2019-07-07 DIAGNOSIS — K7469 Other cirrhosis of liver: Secondary | ICD-10-CM

## 2019-07-07 DIAGNOSIS — K8020 Calculus of gallbladder without cholecystitis without obstruction: Secondary | ICD-10-CM | POA: Diagnosis not present

## 2019-07-21 DIAGNOSIS — K7469 Other cirrhosis of liver: Secondary | ICD-10-CM | POA: Diagnosis not present

## 2019-08-04 ENCOUNTER — Ambulatory Visit: Payer: Medicare HMO | Admitting: Gastroenterology

## 2019-08-04 ENCOUNTER — Encounter

## 2019-08-04 NOTE — Progress Notes (Deleted)
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 She's had decompensations to include ascites, hepatic encephalopathy, and bleeding varices in the past. She is followed by Dr. Zollie Scale / [place] in the hepatology transplant clinic. She remains on lasix 77m BID and aldactone 2063m/ day. She thinks this mostly works to keep fluid status okay. Main issue appears to be encephalopathy and her gait. Husband is concerned about recurrent falls, and she has a history of multiple fractures. She is adament she has not used any alcohol. She is taking Rifaximin but not the lactulose due to fear of diarrhea and urgent stools. She is constipated at baseline. She has had multiple fractures due to the falls, her hepatology team was concerned about this in light of her osteoporosis and had recommended PT, possible neurology consult and orthopedics consult. She has not had any of these consultations. She has not been on beta blocker given severe ascites and volume status issues.  She tells me she has had worsening anemia with iron infusions although I don't see any recent labs regarding this. Last labs per Hepatology noted Hgb of 9s in December. Dr. [place] Crane[place] been coordinating her IV iron infusions. She is not having any blood in the stools. Adopted, no known CRC in the family. She has ongoing back pain from compression fractures and awaiting MRI of her spine. She feels that her gait is not stable and husband more concerned this is due to the encephalopathy and is concerned she is not taking her lactulose which is related. She has not been thought to be a candidate for TIPS in the past due to encephalopathy. Seen by hepatology 12/21/2018  Her last EGD showed trace varices in 03/2018 as well as gastritis and esophageal candidiasis. She is taking omeprazole 2065mShe otherwise had a colonoscopy 03/2018. She had severely congested colonic mucosa due to portal hypertension. She had a  significantly redundant colon and with edema, despite trial of multiple colonoscope, cecal intubation was not achieved, somewhere in right colon suspected.  [place] with RUQ [place] 01/05/19.   EGD 03/15/2018 - Esophagogastric landmarks identified. - Trace to small esophageal varices without high risk stigmata - banding not performed in light of small size and thrombocytopenia. - Multiple white plaques in the esophagus. Brushings performed to rule out candidiasis. - Non-bleeding small gastric ulcer with no stigmata of bleeding. - Gastritis. Biopsied to rule out H pylori - Normal duodenal bulb and second portion of the duodenum.,   Colonoscopy 03/15/18 -  The perianal and digital rectal examinations were normal. Findings: The colon was redundant with severe looping. The entire colon was severely congested / edematous, consistent with changes due to portal hypertension. Most severe in the sigmoid colon which made visualization difficut. Due to severe looping / redundancy / edema, initial attempt at cecal intubation with pediatric colonoscope was not succesfull. The scope was exchanged for an adult colonoscope. The suspected ascending colon was reached but cecal intubation was not successful despite multiple attempts / abdominal pressure. Internal hemorrhoids were found during retroflexion.  RUQ US [place]5/20 - no mass lesions, cirrhosis, gallstones  CT scan abdomen 07/07/2018 - multiple vertebral fractures, fracture of right rib, remote pelvic fractures, "inflammation of the ascending colon", R adnexal cystic lesion  [age] female here for reassessment of the following issues:  Cirrhosis / hepatic encephalopathy / esophageal varices / history of falls / anemia - followed by hepatology, she does  not meet criteria for transplant yet but remains quite frail. Numerous fractures, multiple falls, which may be due to hepatic encephalopathy, she is not taking her lactulose. I discussed the  importance of taking the lactulose with her, unfortunately loose stools will be expected with this but very important she is compliant with it. I would do this and see how she does, agree with PT consult for gait stability and possible neurology consult, I recommend she touch base with Dr. [place] Crane about these. Otherwise relatively recent EGD and colonoscopy as above, apparently she received IV Iron but I don't see labs in regards to severity of her anemia or iron stores, not sure if from portal hypertensive gastritis or something else. She is not on beta blockade due to ascites / diuretics need. [place] screening UTD. Repeat EGD in 2021. We could not reach the cecum during her colonoscopy as outlined, we discussed possible virtual colonoscopy to clear the right colon and she is interested in this. Will get records first from PCP so I can review that and then discuss scheduling. She will continue to follow with hepatology.  Colon cancer screening - as above, may proceed with virtual colonoscopy to clear right colon  Ovarian cyst - need to see if she has had a follow up pelvic US, don't see any in our system, this should be done to follow up prior CT changes if not already.   [place], MD [facility]  Records received from Dr. Juel Burrow office, labs as follows:  Lab 02/06/19 - Hgb 8.0, MCV 78, plt 34, WBC 3.3, MCV 78 BUN 13, CR 1.05 AST 40, ALT 16, T bil 1.7, TSH normal  She has received IV iron from Dr. [place] Crane for this, defer to him for her follow up CBC.  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 She did not have a clear cause on colonoscopy however cecal intubation was not possible given edema of her colon / severe redundancy. Given her anemia I think a virtual colonoscopy is reasonable to clear her entire colon.   Virtual colonoscopy 04/24/19 - IMPRESSION: No significant colonic polyp, mass, apple core lesion, or stricture.   Cirrhosis. Splenomegaly. Cholelithiasis. 14 mm nonobstructing right lower pole renal calculus, without hydronephrosis. 4.6 cm right ovarian cystic lesion, better evaluated on recent pelvic MRI.  [place] 07/07/19 - IMPRESSION: Findings consistent with hepatic cirrhosis. Cholelithiasis is noted without evidence of cholecystitis.    Past Medical History:  Diagnosis Date  . Anxiety    pt. denies  . Cirrhosis of liver ([place])   . Depression    pt. denies  . Heart murmur    "not concerned about "   . History of abdominal paracentesis   . History of blood transfusion   . Hypothyroidism   . Ileus ([place])   . Liver disease 2008  . Neuropathy   . Osteoporosis 02/09/2018  . Pancytopenia ([place])   . Pneumonia   . Vaginal Pap smear, abnormal      Past Surgical History:  Procedure Laterality Date  . AUGMENTATION MAMMAPLASTY Bilateral    20 years ago  . BIOPSY  03/15/2018   Procedure: BIOPSY;  Surgeon: Yetta Flock, MD;  Location: [facility];  Service: Gastroenterology;;  Gastric  . COLONOSCOPY WITH ESOPHAGOGASTRODUODENOSCOPY (EGD) AND ESOPHAGEAL DILATION (ED)    . COLONOSCOPY WITH PROPOFOL N/A 03/15/2018   Procedure: COLONOSCOPY WITH PROPOFOL;  Surgeon: Yetta Flock, MD;  [facility];  Service: Gastroenterology;  Laterality: N/A;  . ESOPHAGOGASTRODUODENOSCOPY (EGD) WITH PROPOFOL N/A 03/15/2018   Procedure: ESOPHAGOGASTRODUODENOSCOPY (EGD) WITH PROPOFOL;  Surgeon: Yetta Flock, MD;  [facility];  Service: Gastroenterology;  Laterality: N/A;  . ESOPHAGOGASTRODUODENOSCOPY W/ BANDING     Varices  . FRACTURE SURGERY Left    x 2 L elbow and hip left   . HARDWARE REMOVAL Left 09/17/2017   Procedure: HARDWARE REMOVAL LEFT OLECRANON;  Surgeon: Altamese Green Lane, MD;  Location: [place];  Service: Orthopedics;  Laterality: Left;  . HIP SURGERY Left    from fracture- rod in palce  . IR PARACENTESIS  11/18/2017  . IR PARACENTESIS  11/26/2017  . IR PARACENTESIS   03/14/2018  . KNEE SURGERY Right    open incision for ligaments  . ORIF ELBOW FRACTURE Left 04/30/2017   Procedure: REVISION OPEN REDUCTION INTERNAL FIXATION (ORIF) ELBOW/OLECRANON FRACTURE;  Surgeon: Altamese [place], MD;  Location: [place];  Service: Orthopedics;  Laterality: Left;   Family History  Adopted: Yes  Problem Relation Age of Onset  . Alcohol abuse Daughter    Social History   Tobacco Use  . Smoking status: Never Smoker  . Smokeless tobacco: Never Used  Substance Use Topics  . Alcohol use: No  . Drug use: No   Current Outpatient Medications  Medication Sig Dispense Refill  . denosumab (PROLIA) 60 MG/ML SOSY injection Inject 60 mg into the skin every 6 (six) months.    . furosemide (LASIX) 40 MG tablet Take 1 tablet (40 mg total) by mouth 2 (two) times daily. 120 tablet 3  . gabapentin (NEURONTIN) 100 MG capsule Take 2 capsules (200 mg total) by mouth 3 (three) times daily. (Patient taking differently: Take 200 mg by mouth as needed. ) 90 capsule 0  . lactulose (CHRONULAC) 10 GM/15ML solution Take 30 mLs (20 g total) by mouth 3 (three) times daily. (Patient taking differently: Take 20 g by mouth daily as needed. ) 946 mL 0  . levOCARNitine (CARNITOR) 330 MG tablet Take 330 mg by mouth 3 (three) times daily.    Marland Kitchen levothyroxine (SYNTHROID, LEVOTHROID) 75 MCG tablet Take 1 tablet (75 mcg total) by mouth daily before breakfast. 30 tablet 0  . omeprazole (PRILOSEC) 20 MG capsule Take 1 capsule (20 mg total) by mouth 1 day or 1 dose for 1 dose. 1 capsule 0  . ondansetron (ZOFRAN-ODT) 4 MG disintegrating tablet Take 4 mg by mouth every 8 (eight) hours as needed for nausea or vomiting.    Marland Kitchen QUEtiapine (SEROQUEL) 25 MG tablet Take 25 mg by mouth at bedtime.     . rifaximin (XIFAXAN) 550 MG TABS tablet Take 1 tablet (550 mg total) by mouth 2 (two) times daily. 60 tablet 0  . sertraline (ZOLOFT) 100 MG tablet Take 100 mg by mouth daily.     Marland Kitchen spironolactone (ALDACTONE) 100 MG tablet Take  2 tablets (200 mg total) by mouth daily. 30 tablet 0  . traZODone (DESYREL) 50 MG tablet Take 50 mg at bedtime by mouth.     No current facility-administered medications for this visit.    Allergies  Allergen Reactions  . Penicillins Swelling and Other (See Comments)    FACIAL SWELLING  PATIENT HAS HAD A PCN REACTION WITH IMMEDIATE RASH, FACIAL/TONGUE/THROAT SWELLING, SOB, OR LIGHTHEADEDNESS WITH HYPOTENSION:  #  #  #  YES  #  #  #   Has patient had a PCN reaction causing severe rash involving mucus membranes or skin necrosis: No Has patient had  a PCN reaction that required hospitalization: No Has patient had a PCN reaction occurring within the last 10 years: No If all of the above answers are "NO", then may proceed with Cephalosporin u  . Tylenol [Acetaminophen] Nausea Only  . Sulfa Antibiotics Itching and Rash  . Tramadol Swelling and Rash     Review of Systems: All systems reviewed and negative except where noted in HPI.    US Abdomen Limited Ruq  Result Date: 07/07/2019 CLINICAL DATA:  Hepatic cirrhosis. EXAM: ULTRASOUND ABDOMEN LIMITED RIGHT UPPER QUADRANT COMPARISON:  Ultrasound of January 05, 2019. FINDINGS: Gallbladder: Cholelithiasis is noted with largest calculus measuring 1.1 cm. No gallbladder wall thickening or pericholecystic fluid is noted. No sonographic Murphy's sign is noted. Common bile duct: Diameter: 3 mm which is within normal limits. Liver: Heterogeneous echotexture of hepatic parenchyma is noted with slightly nodular contours suggesting hepatic cirrhosis. No definite focal hepatic abnormality is noted. Portal vein is patent on color Doppler imaging with normal direction of blood flow towards the liver. Other: None. IMPRESSION: Findings consistent with hepatic cirrhosis. Cholelithiasis is noted without evidence of cholecystitis. Electronically Signed   By: Marijo Conception M.D.   On: 07/07/2019 15:23    Physical Exam: There were no vitals taken for this visit.  Constitutional: Pleasant,well-developed, ***female in no acute distress. HEENT: Normocephalic and atraumatic. Conjunctivae are normal. No scleral icterus. Neck supple.  Cardiovascular: Normal rate, regular rhythm.  Pulmonary/chest: Effort normal and breath sounds normal. No wheezing, rales or rhonchi. Abdominal: Soft, nondistended, nontender. Bowel sounds active throughout. There are no masses palpable. No hepatomegaly. Extremities: no edema Lymphadenopathy: No cervical adenopathy noted. Neurological: Alert and oriented to person place and time. Skin: Skin is warm and dry. No rashes noted. Psychiatric: Normal mood and affect. Behavior is normal.   ASSESSMENT AND PLAN:  Celene Squibb, MD

## 2019-08-28 DIAGNOSIS — R269 Unspecified abnormalities of gait and mobility: Secondary | ICD-10-CM | POA: Diagnosis not present

## 2019-08-28 DIAGNOSIS — G894 Chronic pain syndrome: Secondary | ICD-10-CM | POA: Diagnosis not present

## 2019-08-28 DIAGNOSIS — F39 Unspecified mood [affective] disorder: Secondary | ICD-10-CM | POA: Diagnosis not present

## 2019-08-28 DIAGNOSIS — M1990 Unspecified osteoarthritis, unspecified site: Secondary | ICD-10-CM | POA: Diagnosis not present

## 2019-08-28 DIAGNOSIS — L659 Nonscarring hair loss, unspecified: Secondary | ICD-10-CM | POA: Diagnosis not present

## 2019-08-28 DIAGNOSIS — Z23 Encounter for immunization: Secondary | ICD-10-CM | POA: Diagnosis not present

## 2019-08-28 DIAGNOSIS — G4700 Insomnia, unspecified: Secondary | ICD-10-CM | POA: Diagnosis not present

## 2019-08-31 DIAGNOSIS — M2550 Pain in unspecified joint: Secondary | ICD-10-CM | POA: Diagnosis not present

## 2019-08-31 DIAGNOSIS — M549 Dorsalgia, unspecified: Secondary | ICD-10-CM | POA: Diagnosis not present

## 2019-08-31 DIAGNOSIS — D61818 Other pancytopenia: Secondary | ICD-10-CM | POA: Diagnosis not present

## 2019-08-31 DIAGNOSIS — M79641 Pain in right hand: Secondary | ICD-10-CM | POA: Diagnosis not present

## 2019-08-31 DIAGNOSIS — M79643 Pain in unspecified hand: Secondary | ICD-10-CM | POA: Diagnosis not present

## 2019-08-31 DIAGNOSIS — K7460 Unspecified cirrhosis of liver: Secondary | ICD-10-CM | POA: Diagnosis not present

## 2019-08-31 DIAGNOSIS — M79672 Pain in left foot: Secondary | ICD-10-CM | POA: Diagnosis not present

## 2019-08-31 DIAGNOSIS — M79642 Pain in left hand: Secondary | ICD-10-CM | POA: Diagnosis not present

## 2019-08-31 DIAGNOSIS — M79671 Pain in right foot: Secondary | ICD-10-CM | POA: Diagnosis not present

## 2019-08-31 DIAGNOSIS — M6530 Trigger finger, unspecified finger: Secondary | ICD-10-CM | POA: Diagnosis not present

## 2019-08-31 DIAGNOSIS — M25522 Pain in left elbow: Secondary | ICD-10-CM | POA: Diagnosis not present

## 2019-09-14 DIAGNOSIS — M6530 Trigger finger, unspecified finger: Secondary | ICD-10-CM | POA: Diagnosis not present

## 2019-09-14 DIAGNOSIS — M549 Dorsalgia, unspecified: Secondary | ICD-10-CM | POA: Diagnosis not present

## 2019-09-14 DIAGNOSIS — K7460 Unspecified cirrhosis of liver: Secondary | ICD-10-CM | POA: Diagnosis not present

## 2019-09-14 DIAGNOSIS — M810 Age-related osteoporosis without current pathological fracture: Secondary | ICD-10-CM | POA: Diagnosis not present

## 2019-09-14 DIAGNOSIS — M25522 Pain in left elbow: Secondary | ICD-10-CM | POA: Diagnosis not present

## 2019-09-14 DIAGNOSIS — M79643 Pain in unspecified hand: Secondary | ICD-10-CM | POA: Diagnosis not present

## 2019-09-14 DIAGNOSIS — D61818 Other pancytopenia: Secondary | ICD-10-CM | POA: Diagnosis not present

## 2019-09-14 DIAGNOSIS — M2550 Pain in unspecified joint: Secondary | ICD-10-CM | POA: Diagnosis not present

## 2019-09-14 DIAGNOSIS — M1990 Unspecified osteoarthritis, unspecified site: Secondary | ICD-10-CM | POA: Diagnosis not present

## 2019-09-15 ENCOUNTER — Ambulatory Visit: Payer: Medicare HMO | Admitting: Gastroenterology

## 2019-10-05 DIAGNOSIS — M25552 Pain in left hip: Secondary | ICD-10-CM | POA: Diagnosis not present

## 2019-10-05 DIAGNOSIS — M25561 Pain in right knee: Secondary | ICD-10-CM | POA: Diagnosis not present

## 2019-10-05 DIAGNOSIS — Z79899 Other long term (current) drug therapy: Secondary | ICD-10-CM | POA: Diagnosis not present

## 2019-10-05 DIAGNOSIS — M545 Low back pain: Secondary | ICD-10-CM | POA: Diagnosis not present

## 2019-10-05 DIAGNOSIS — M546 Pain in thoracic spine: Secondary | ICD-10-CM | POA: Diagnosis not present

## 2019-10-05 DIAGNOSIS — G8929 Other chronic pain: Secondary | ICD-10-CM | POA: Diagnosis not present

## 2019-10-17 ENCOUNTER — Ambulatory Visit (HOSPITAL_COMMUNITY): Payer: Medicare HMO

## 2019-10-20 ENCOUNTER — Encounter (HOSPITAL_COMMUNITY)
Admission: RE | Admit: 2019-10-20 | Discharge: 2019-10-20 | Disposition: A | Discharge status: Home / Self Care | Payer: Medicare HMO | Source: Ambulatory Visit | Attending: Internal Medicine | Admitting: Internal Medicine

## 2019-10-24 DIAGNOSIS — G8929 Other chronic pain: Secondary | ICD-10-CM | POA: Diagnosis not present

## 2019-10-24 DIAGNOSIS — M25561 Pain in right knee: Secondary | ICD-10-CM | POA: Diagnosis not present

## 2019-10-24 DIAGNOSIS — M25552 Pain in left hip: Secondary | ICD-10-CM | POA: Diagnosis not present

## 2019-10-24 DIAGNOSIS — M79645 Pain in left finger(s): Secondary | ICD-10-CM | POA: Diagnosis not present

## 2019-10-24 DIAGNOSIS — S63659A Sprain of metacarpophalangeal joint of unspecified finger, initial encounter: Secondary | ICD-10-CM | POA: Diagnosis not present

## 2019-10-24 DIAGNOSIS — M79642 Pain in left hand: Secondary | ICD-10-CM | POA: Diagnosis not present

## 2019-10-24 DIAGNOSIS — Z79899 Other long term (current) drug therapy: Secondary | ICD-10-CM | POA: Diagnosis not present

## 2019-10-24 DIAGNOSIS — M25642 Stiffness of left hand, not elsewhere classified: Secondary | ICD-10-CM | POA: Diagnosis not present

## 2019-10-24 DIAGNOSIS — M545 Low back pain: Secondary | ICD-10-CM | POA: Diagnosis not present

## 2019-10-25 ENCOUNTER — Ambulatory Visit: Payer: Medicare HMO | Admitting: Gastroenterology

## 2019-10-29 IMAGING — DX DG SACRUM/COCCYX 2+V
3 series · 3 of 3 positions shown · non-contrast
Comparison: None.

CLINICAL DATA: Lower back pain after fall.

EXAM:
SACRUM AND COCCYX - 2+ VIEW

[coccyx ap]
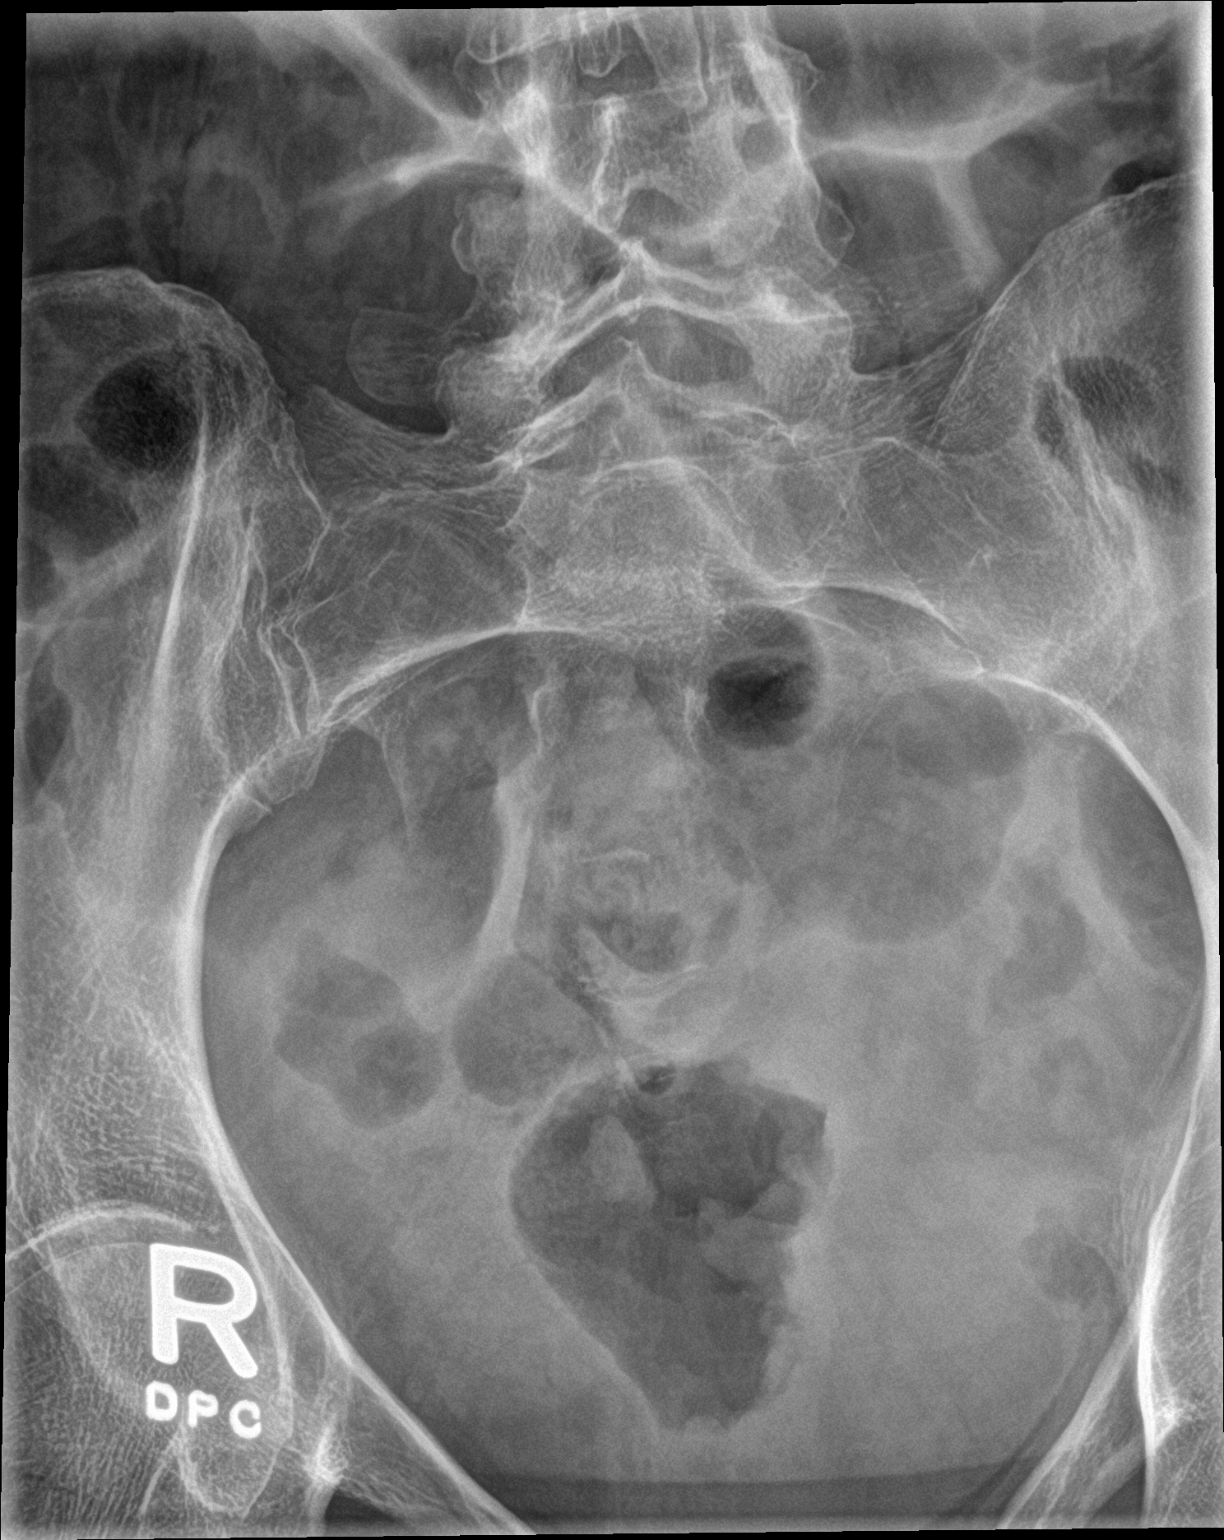

[sacrum ap]
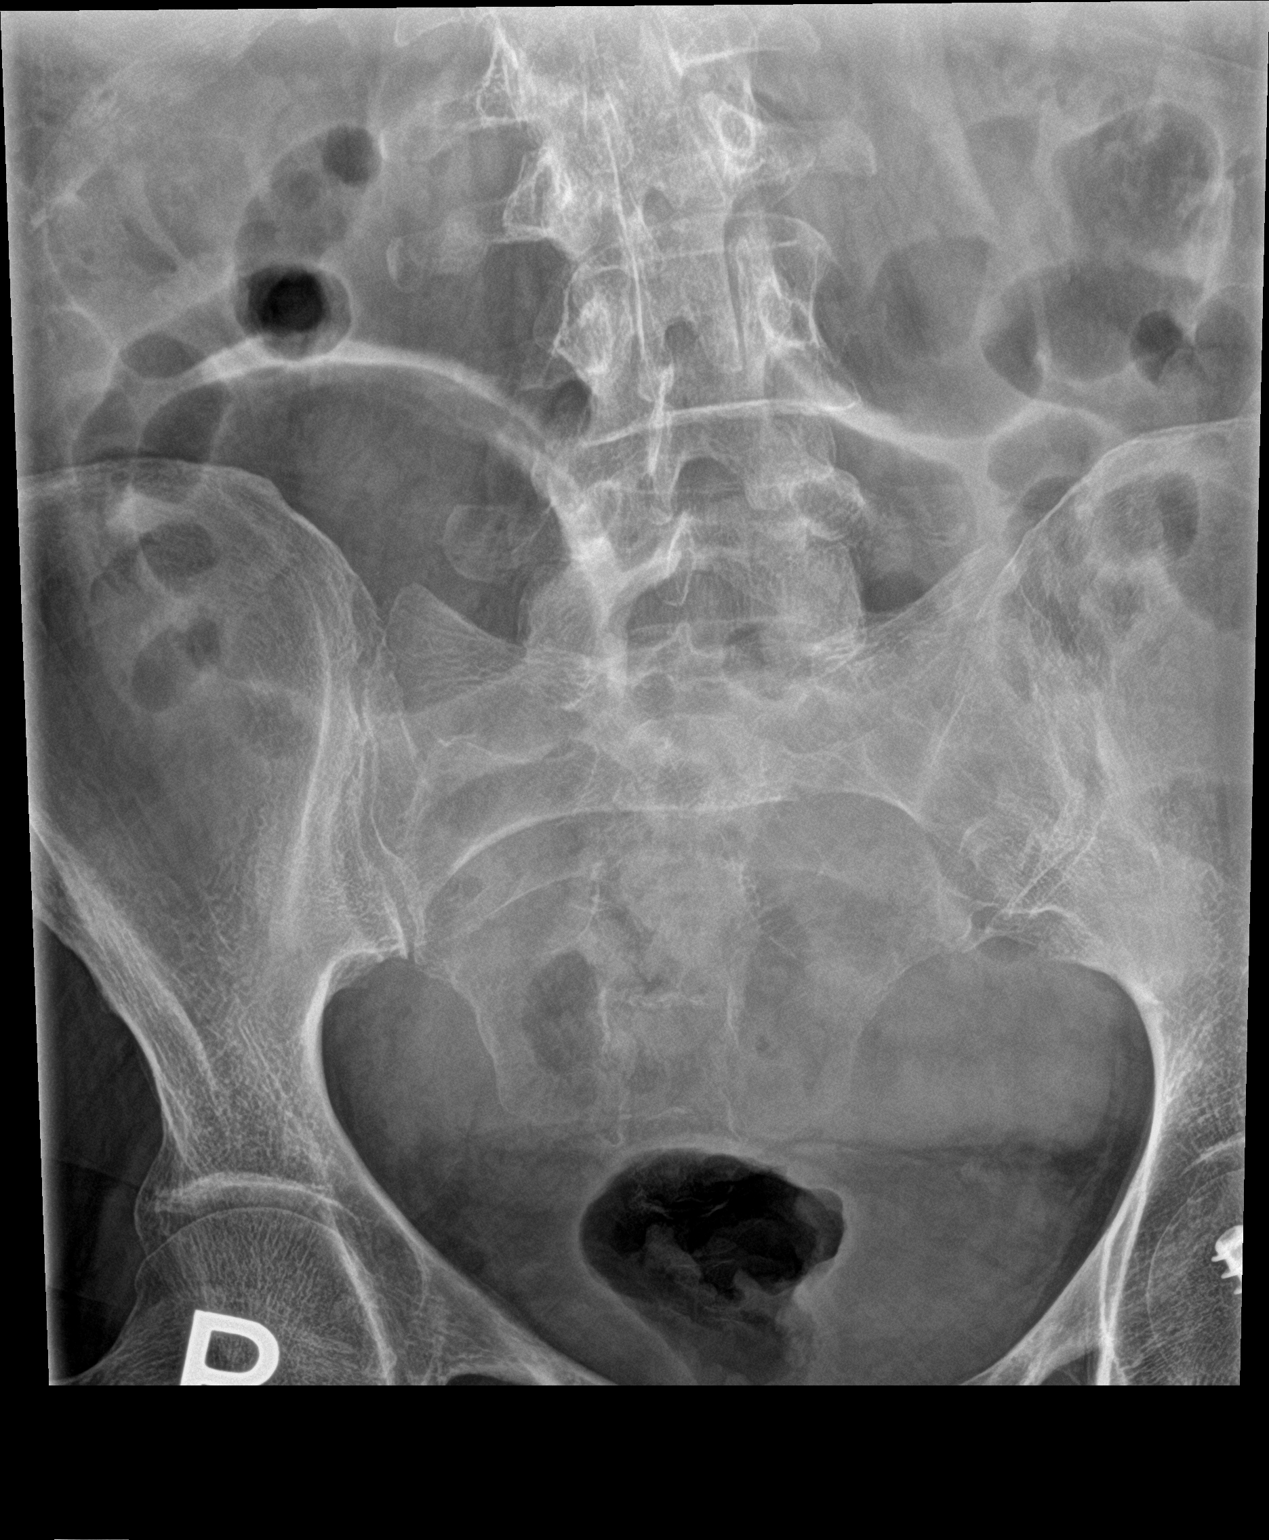

[sacrum lat]
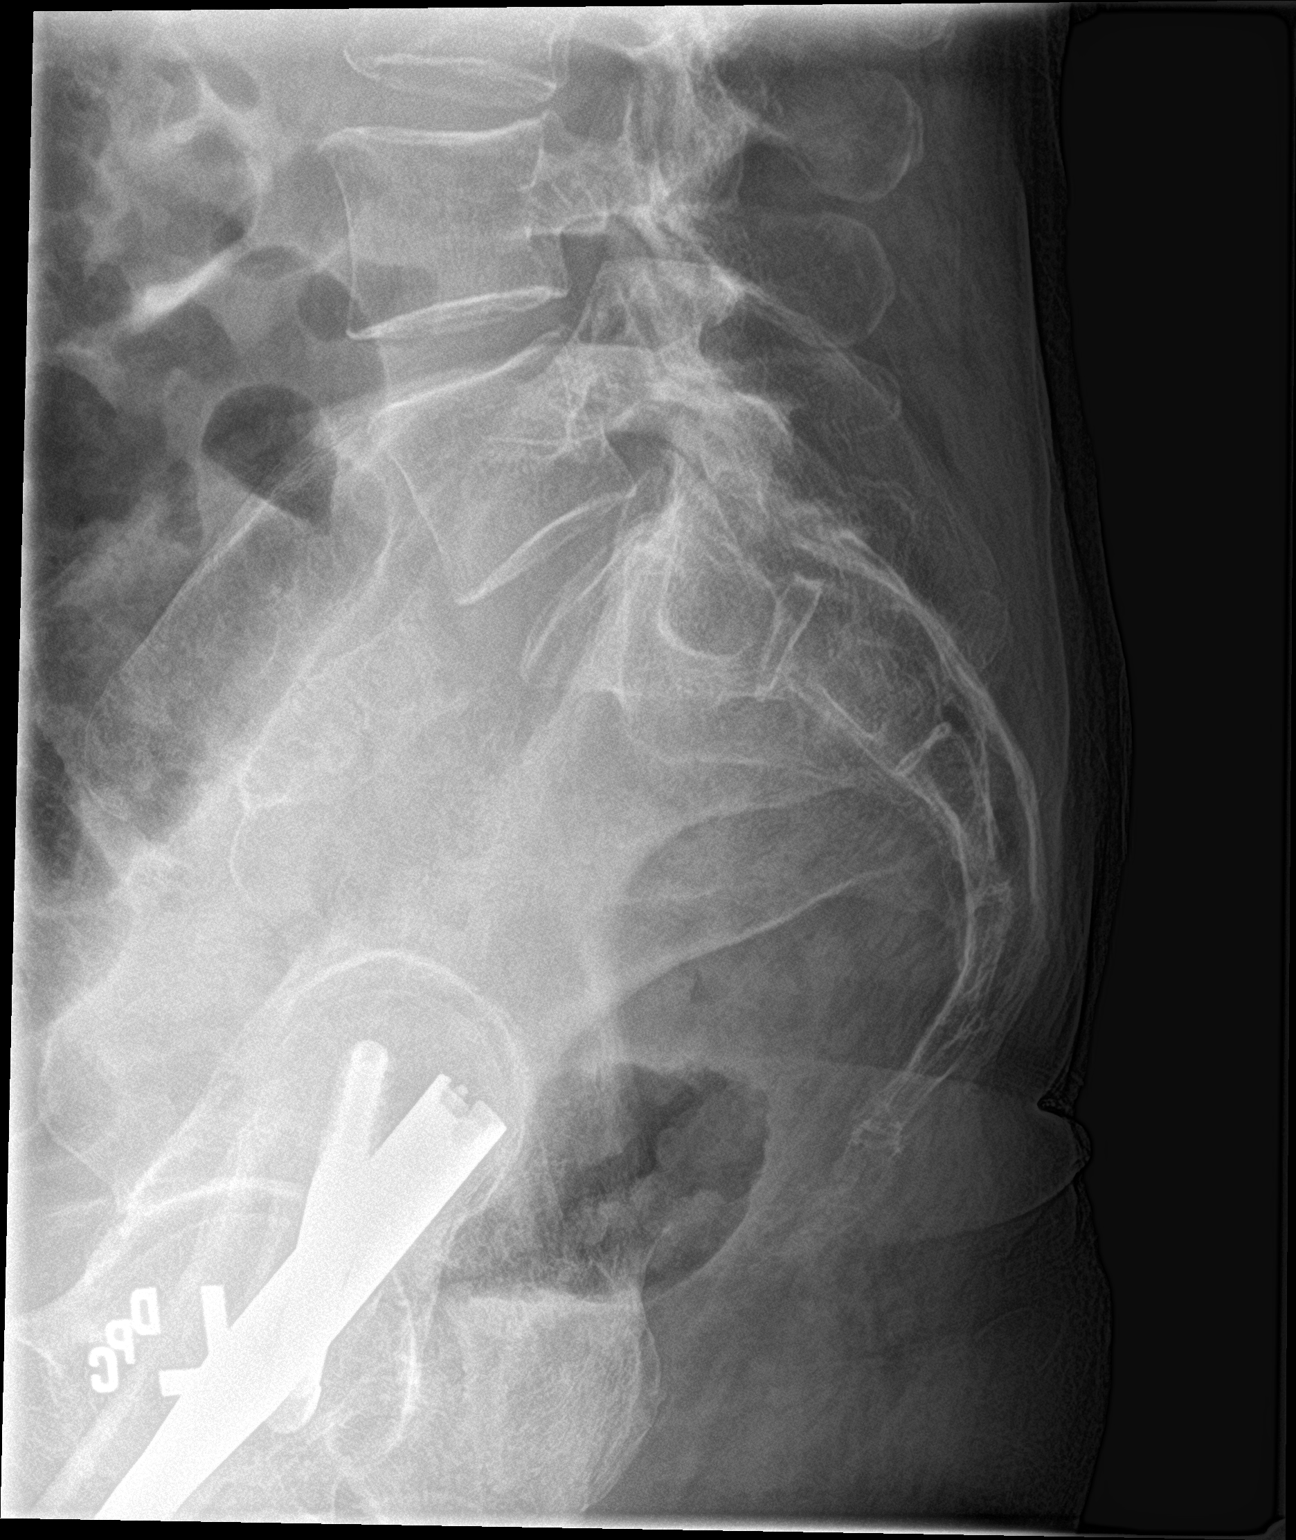

[3 of 3 positions shown; findings below may reference images not displayed]

FINDINGS: Slightly buckled appearance of the S2 segment of the sacrum along
its ventral cortex. This may represent a subtle sacral fracture.
Remainder of the study is unremarkable. The patient is status post
left femoral nail fixation.
IMPRESSION: There is a slight buckled appearance of the ventral cortex of the S2
segment of the sacrum. This has the appearance of a subtle fracture.

## 2019-10-29 IMAGING — DX DG CHEST 2V
2 series · 2 of 2 positions shown · non-contrast
Comparison: None.

CLINICAL DATA: Multiple falls over the past 2 days.  Weakness.

EXAM:
CHEST  2 VIEW

[chest lat]
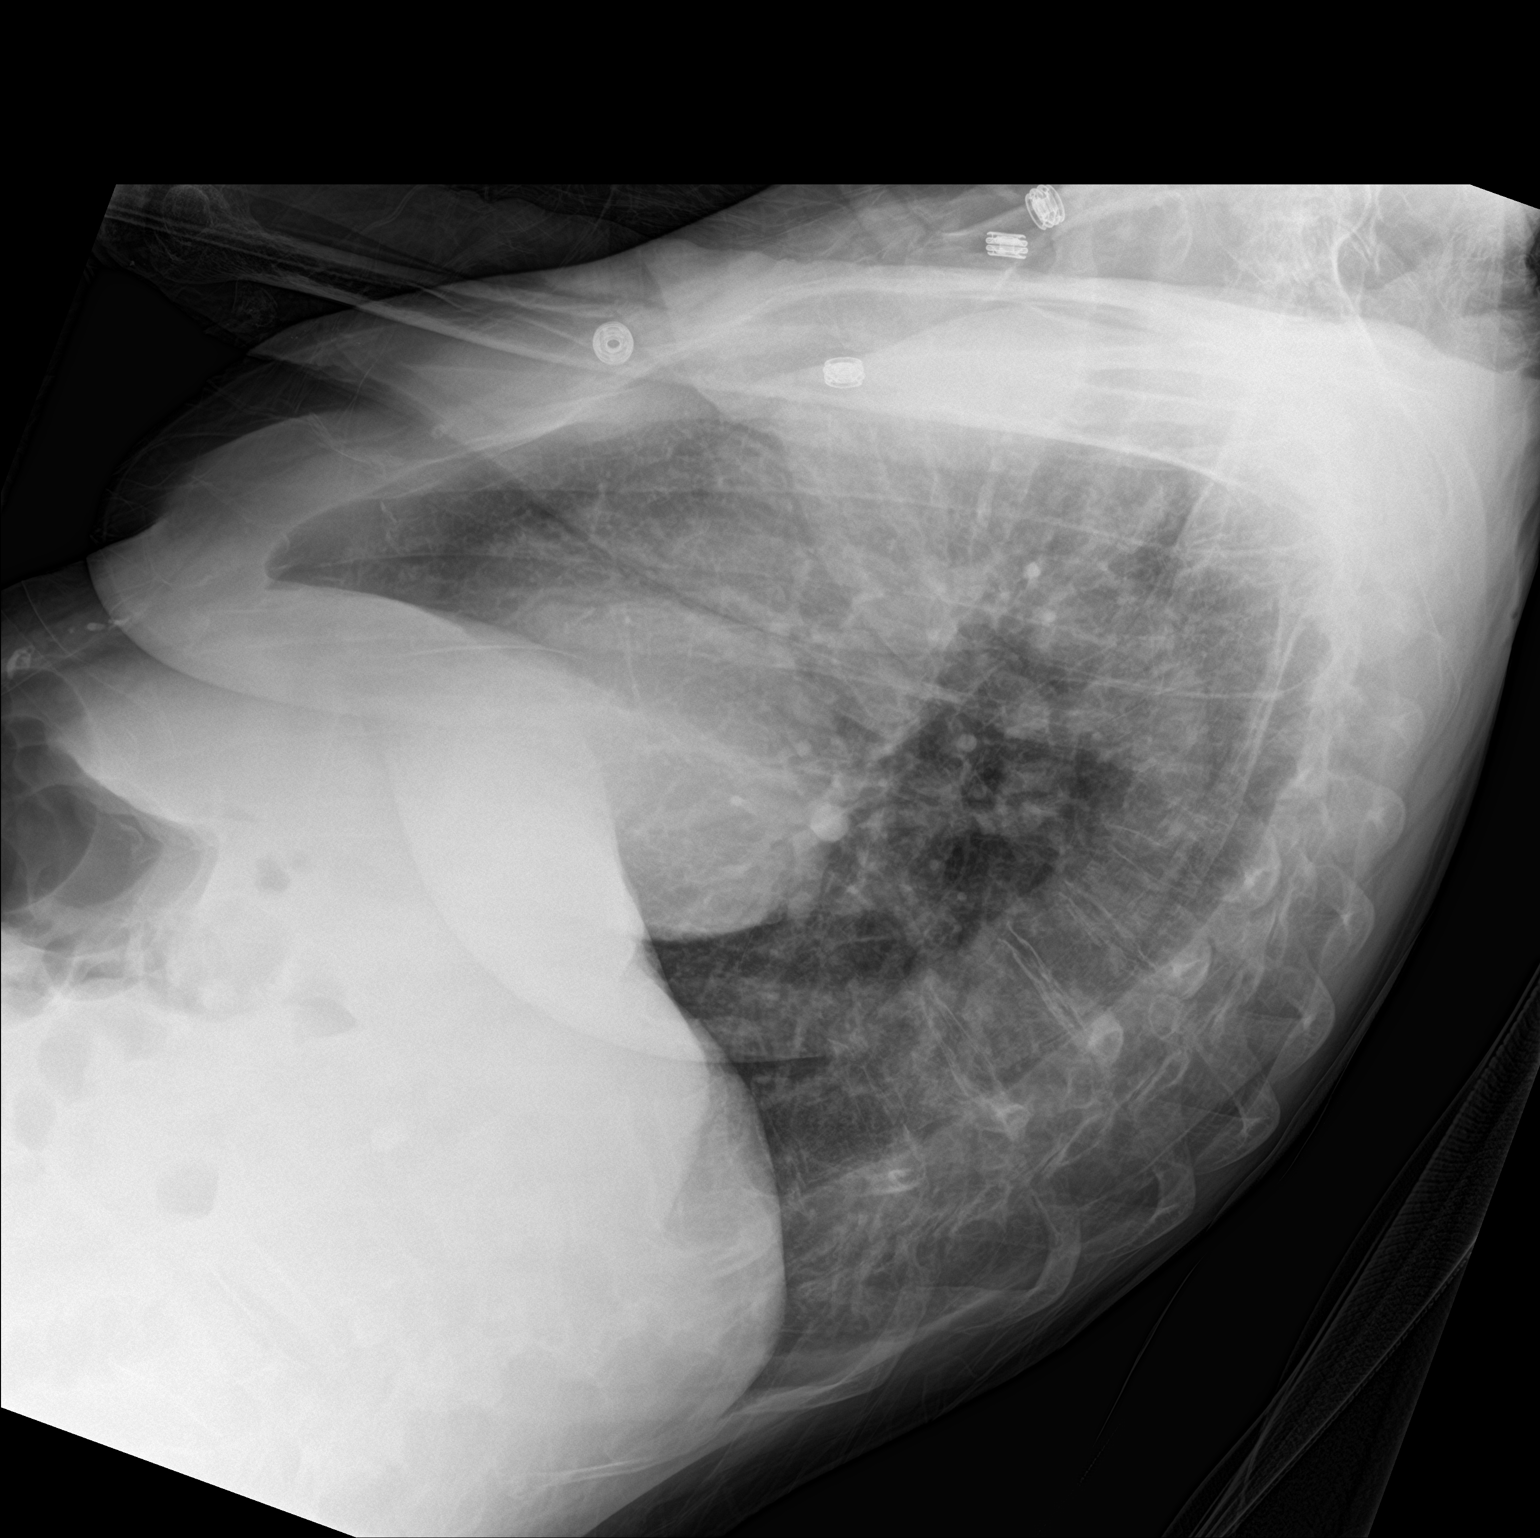

[chest ap]
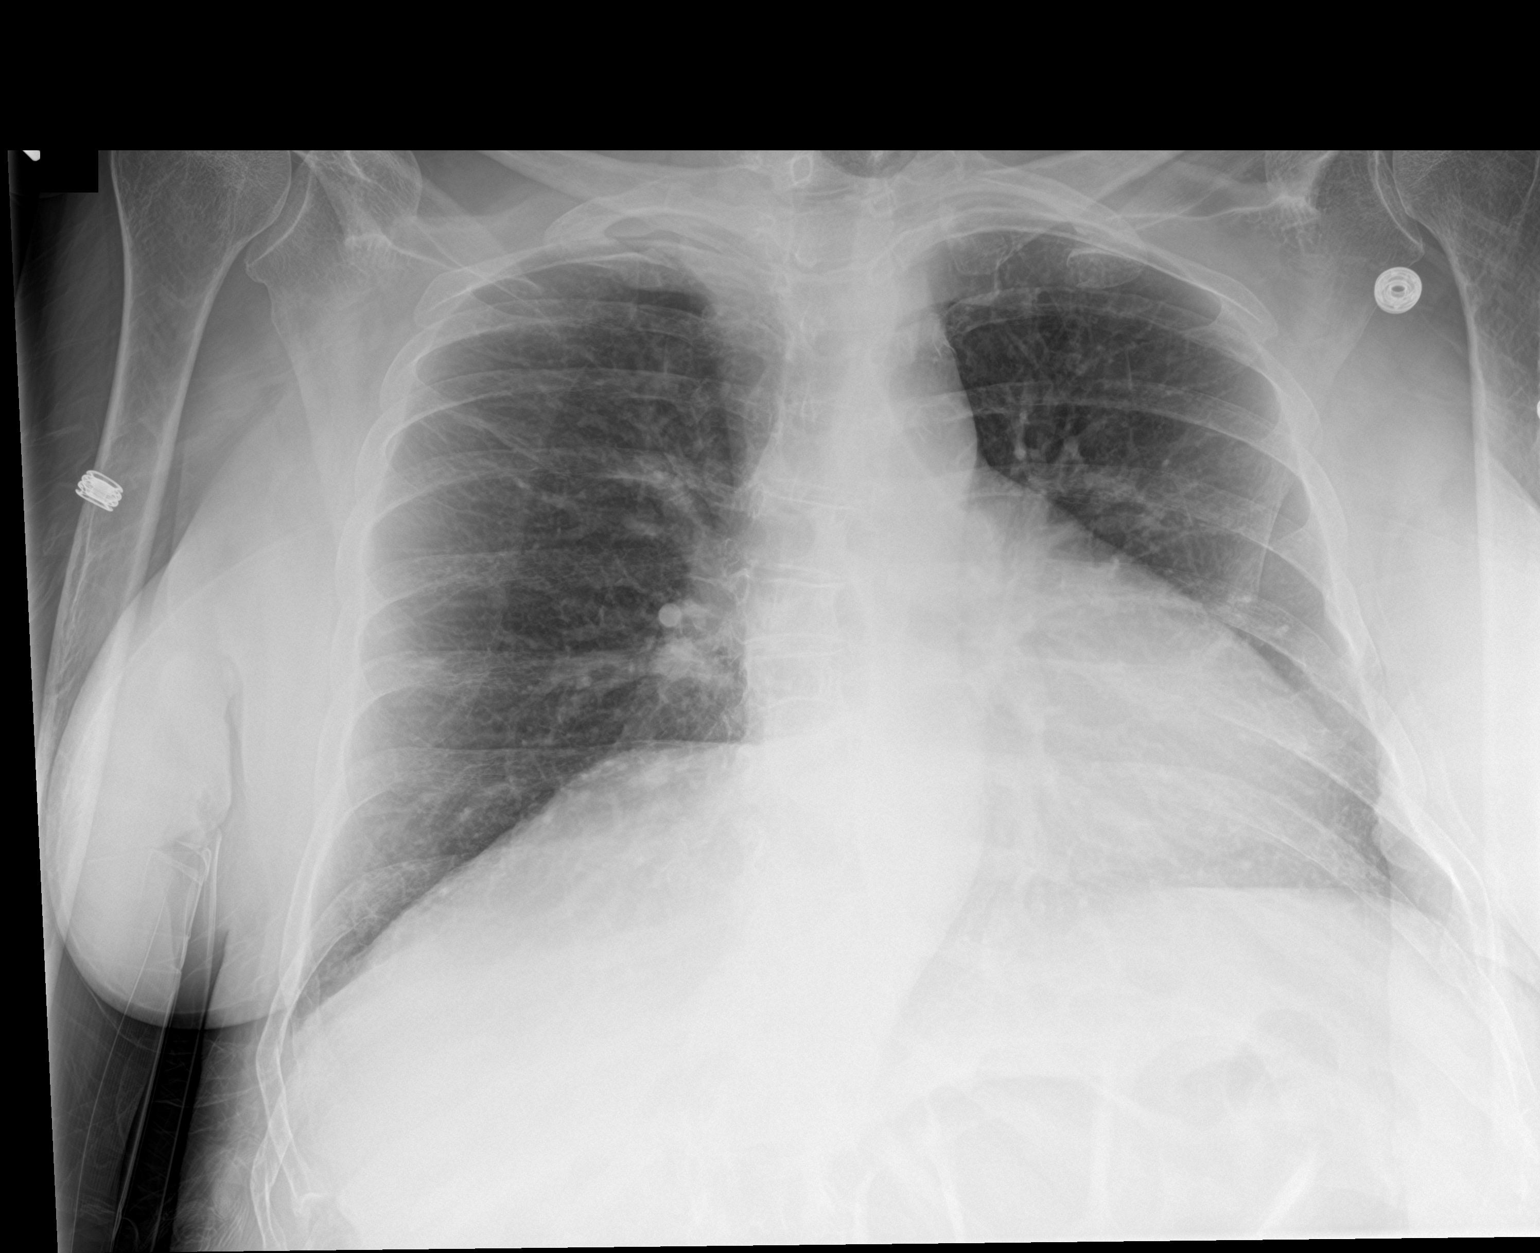

[2 of 2 positions shown; findings below may reference images not displayed]

FINDINGS: The heart size and mediastinal contours are within normal limits.
Lung volumes are slightly low without pneumonic consolidation or
CHF. No effusion or pneumothorax. Chronic left fifth and sixth rib
fractures. No acute osseous abnormality of the bony thorax.
IMPRESSION: No active cardiopulmonary disease.

## 2019-10-30 ENCOUNTER — Telehealth: Payer: Self-pay | Admitting: *Deleted

## 2019-10-30 NOTE — Telephone Encounter (Signed)
Patient called and scheduled her follow up appt after her MRI

## 2019-11-17 ENCOUNTER — Ambulatory Visit (HOSPITAL_COMMUNITY)
Admission: RE | Admit: 2019-11-17 | Discharge: 2019-11-17 | Disposition: A | Discharge status: Home / Self Care | Payer: Medicare HMO | Source: Ambulatory Visit | Attending: Gynecologic Oncology | Admitting: Gynecologic Oncology

## 2019-11-17 ENCOUNTER — Other Ambulatory Visit: Payer: Self-pay

## 2019-11-17 DIAGNOSIS — N949 Unspecified condition associated with female genital organs and menstrual cycle: Secondary | ICD-10-CM | POA: Insufficient documentation

## 2019-11-17 DIAGNOSIS — N83291 Other ovarian cyst, right side: Secondary | ICD-10-CM | POA: Diagnosis not present

## 2019-11-17 DIAGNOSIS — N83292 Other ovarian cyst, left side: Secondary | ICD-10-CM | POA: Diagnosis not present

## 2019-11-17 LAB — POCT I-STAT CREATININE: Creatinine, Ser: 1 mg/dL (ref 0.44–1.00)

## 2019-11-17 MED ORDER — GADOBUTROL 1 MMOL/ML IV SOLN
7.0000 mL | Freq: Once | INTRAVENOUS | Status: AC | PRN
  Administered 2019-11-17: 7.5 mL via INTRAVENOUS

## 2019-11-23 DIAGNOSIS — M545 Low back pain: Secondary | ICD-10-CM | POA: Diagnosis not present

## 2019-11-23 DIAGNOSIS — M25561 Pain in right knee: Secondary | ICD-10-CM | POA: Diagnosis not present

## 2019-11-23 DIAGNOSIS — Z79899 Other long term (current) drug therapy: Secondary | ICD-10-CM | POA: Diagnosis not present

## 2019-11-23 DIAGNOSIS — M25552 Pain in left hip: Secondary | ICD-10-CM | POA: Diagnosis not present

## 2019-11-23 DIAGNOSIS — G8929 Other chronic pain: Secondary | ICD-10-CM | POA: Diagnosis not present

## 2019-11-24 ENCOUNTER — Encounter: Payer: Self-pay | Admitting: Gynecologic Oncology

## 2019-11-24 ENCOUNTER — Inpatient Hospital Stay (HOSPITAL_BASED_OUTPATIENT_CLINIC_OR_DEPARTMENT_OTHER): Payer: Medicare HMO | Admitting: Gynecologic Oncology

## 2019-11-24 ENCOUNTER — Inpatient Hospital Stay: Payer: Medicare HMO | Admitting: Gynecologic Oncology

## 2019-11-24 DIAGNOSIS — N83201 Unspecified ovarian cyst, right side: Secondary | ICD-10-CM

## 2019-11-24 DIAGNOSIS — N83202 Unspecified ovarian cyst, left side: Secondary | ICD-10-CM | POA: Diagnosis not present

## 2019-11-24 NOTE — Progress Notes (Signed)
Gynecologic Oncology Telehealth Consult Note: Gyn-Onc  I connected with Megan Roberson on 11/24/19 at  1:00 PM EST by telephone and verified that I am speaking with the correct person using two identifiers.  I discussed the limitations, risks, security and privacy concerns of performing an evaluation and management service by telemedicine and the availability of in-person appointments. I also discussed with the patient that there may be a patient responsible charge related to this service. The patient expressed understanding and agreed to proceed.  Other persons participating in the visit and their role in the encounter: none.  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  CT A/P on 07/07/18 after a fall showed a right adnexal mass measuring 5.0 cm.  The left adnexal region was unremarkable and there is no pelvic fluid. Pelvic ultrasound on 12/31/08: The right ovary is noted to measure 5.1 x 3.8 x 5.2 cm and replaced by a 5.1 x 3.5 x 4.8 mildly complex cystic mass with thin internal septations.  There is no mural nodularity and no internal vascularity without significant change since July 07, 2018.  The left ovary measures 2.3 x 2.4 x 2.3 cm there is a simple left ovarian cyst the largest measuring 1.7 x 1.6 x 1.6 cm.  No abnormal free fluid was appreciated. MRI pelvis 04/2019: A cystic lesion on the right with multiple thin internal septations is seen, which measures 4.8 x 3.5 x 3.4 cm. Internal septation show mild contrast enhancement, but no thickened septations or solid mural nodules visualized. This shows mild increase in size from 3.4 cm on 11/28/2017 CT. Left ovary has a complex cystic lesion containing several thin enhancing internal septations. This measures 2.4 x 2.0 x  1.6 cm, and shows no evidence of thickened septations or solid mural nodules. This appears new compared with earlier CT on 11/28/2017. No peritoneal thickening or abnormal free fluid. CA-125 on 04/11/19: 22.5 MRI pelvis on 11/17/19: Right ovary has a complex cyst again seen containing multiple thin internal septations, but no evidence of contrast enhancement or solid component. This is decreased in size since previous study, currently measuring 3.1 x 2.2 cm on image 17/5, compared to 4.5 x 3.4 cm previously. Left ovary has a complex cystic lesion with several thin internal septations is again seen. No evidence of contrast enhancement or solid component. This currently measures 2.2 x 2.2 cm, compared to 2.1 x 1.7 cm on prior study.  Interval History: Patient reports that overall she is doing well without significant change since her last visit with [place] in August.  She has had multiple falls recently broken her back, elbow, knee, and ankle, mostly on her left side.  She endorses occasional mild pelvic pain.  Her appetite continues to be decreased but she denies any nausea, emesis, or early satiety.  She continues to have 2-3 bowel movements a day that she describes as solid.  Past Medical/Surgical History: Past Medical History:  Diagnosis Date  . Anxiety    pt. denies  . Cirrhosis of liver ([place])   . Depression    pt. denies  . Heart murmur    "not concerned about "   . History of abdominal paracentesis   . History of blood transfusion   . Hypothyroidism   . Ileus ([place])   . Liver disease 2008  . Neuropathy   .  Osteoporosis 02/09/2018  . Pancytopenia ([place])   . Pneumonia   . Vaginal Pap smear, abnormal     Past Surgical History:  Procedure Laterality Date  . AUGMENTATION MAMMAPLASTY Bilateral    20 years ago  . BIOPSY  03/15/2018   Procedure: BIOPSY;  Surgeon: Yetta Flock, MD;  Location: [facility];  Service: Gastroenterology;;  Gastric  . COLONOSCOPY WITH  ESOPHAGOGASTRODUODENOSCOPY (EGD) AND ESOPHAGEAL DILATION (ED)    . COLONOSCOPY WITH PROPOFOL N/A 03/15/2018   Procedure: COLONOSCOPY WITH PROPOFOL;  Surgeon: Yetta Flock, MD;  Location: [facility];  Service: Gastroenterology;  Laterality: N/A;  . ESOPHAGOGASTRODUODENOSCOPY (EGD) WITH PROPOFOL N/A 03/15/2018   Procedure: ESOPHAGOGASTRODUODENOSCOPY (EGD) WITH PROPOFOL;  Surgeon: Yetta Flock, MD;  Location: [facility];  Service: Gastroenterology;  Laterality: N/A;  . ESOPHAGOGASTRODUODENOSCOPY W/ BANDING     Varices  . FRACTURE SURGERY Left    x 2 L elbow and hip left   . HARDWARE REMOVAL Left 09/17/2017   Procedure: HARDWARE REMOVAL LEFT OLECRANON;  Surgeon: Altamese [place], MD;  Location: [facility];  Service: Orthopedics;  Laterality: Left;  . HIP SURGERY Left    from fracture- rod in palce  . IR PARACENTESIS  11/18/2017  . IR PARACENTESIS  11/26/2017  . IR PARACENTESIS  03/14/2018  . KNEE SURGERY Right    open incision for ligaments  . ORIF ELBOW FRACTURE Left 04/30/2017   Procedure: REVISION OPEN REDUCTION INTERNAL FIXATION (ORIF) ELBOW/OLECRANON FRACTURE;  Surgeon: Altamese [place], MD;  Location: [facility];  Service: Orthopedics;  Laterality: Left;    Family History  Adopted: Yes  Problem Relation Age of Onset  . Alcohol abuse Daughter     Social History   Socioeconomic History  . Marital status: Married    Spouse name: Not on file  . Number of children: 2  . Years of education: Not on file  . Highest education level: Not on file  Occupational History  . Occupation: disabled  Tobacco Use  . Smoking status: Never Smoker  . Smokeless tobacco: Never Used  Substance and Sexual Activity  . Alcohol use: No  . Drug use: No  . Sexual activity: Not Currently    Birth control/protection: Post-menopausal  Other Topics Concern  . Not on file  Social History Narrative  . Not on file   Social Determinants of Health   Financial Resource Strain:   . Difficulty of Paying  Living Expenses: Not on file  Food Insecurity:   . Worried About Charity fundraiser in the Last Year: Not on file  . Ran Out of Food in the Last Year: Not on file  Transportation Needs:   . Lack of Transportation (Medical): Not on file  . Lack of Transportation (Non-Medical): Not on file  Physical Activity:   . Days of Exercise per Week: Not on file  . Minutes of Exercise per Session: Not on file  Stress:   . Feeling of Stress : Not on file  Social Connections:   . Frequency of Communication with Friends and Family: Not on file  . Frequency of Social Gatherings with Friends and Family: Not on file  . Attends Religious Services: Not on file  . Active Member of Clubs or Organizations: Not on file  . Attends Archivist Meetings: Not on file  . Marital Status: Not on file    Current Medications:  Current Outpatient Medications:  .  denosumab (PROLIA) 60 MG/ML SOSY injection, Inject 60 mg into the skin every  6 (six) months., Disp: , Rfl:  .  furosemide (LASIX) 40 MG tablet, Take 1 tablet (40 mg total) by mouth 2 (two) times daily., Disp: 120 tablet, Rfl: 3 .  gabapentin (NEURONTIN) 100 MG capsule, Take 2 capsules (200 mg total) by mouth 3 (three) times daily. (Patient taking differently: Take 200 mg by mouth as needed. ), Disp: 90 capsule, Rfl: 0 .  lactulose (CHRONULAC) 10 GM/15ML solution, Take 30 mLs (20 g total) by mouth 3 (three) times daily. (Patient taking differently: Take 20 g by mouth daily as needed. ), Disp: 946 mL, Rfl: 0 .  levOCARNitine (CARNITOR) 330 MG tablet, Take 330 mg by mouth 3 (three) times daily., Disp: , Rfl:  .  levothyroxine (SYNTHROID, LEVOTHROID) 75 MCG tablet, Take 1 tablet (75 mcg total) by mouth daily before breakfast., Disp: 30 tablet, Rfl: 0 .  omeprazole (PRILOSEC) 20 MG capsule, Take 1 capsule (20 mg total) by mouth 1 day or 1 dose for 1 dose., Disp: 1 capsule, Rfl: 0 .  ondansetron (ZOFRAN-ODT) 4 MG disintegrating tablet, Take 4 mg by mouth  every 8 (eight) hours as needed for nausea or vomiting., Disp: , Rfl:  .  QUEtiapine (SEROQUEL) 25 MG tablet, Take 25 mg by mouth at bedtime. , Disp: , Rfl:  .  rifaximin (XIFAXAN) 550 MG TABS tablet, Take 1 tablet (550 mg total) by mouth 2 (two) times daily., Disp: 60 tablet, Rfl: 0 .  sertraline (ZOLOFT) 100 MG tablet, Take 100 mg by mouth daily. , Disp: , Rfl:  .  spironolactone (ALDACTONE) 100 MG tablet, Take 2 tablets (200 mg total) by mouth daily., Disp: 30 tablet, Rfl: 0 .  traZODone (DESYREL) 50 MG tablet, Take 50 mg at bedtime by mouth., Disp: , Rfl:   Review of Symptoms: Denies fevers, chills, unexplained weight changes. Denies hearing loss, neck lumps or masses, mouth sores, ringing in ears or voice changes. Denies cough or wheezing.  Denies shortness of breath. Denies chest pain or palpitations. Denies leg swelling. Denies abdominal distention, blood in stools, constipation, diarrhea, nausea, vomiting, or early satiety. Denies pain with intercourse, dysuria, frequency, hematuria or incontinence. Denies hot flashes, pelvic pain, vaginal bleeding or vaginal discharge.   Denies joint pain, back pain or muscle pain/cramps. Denies itching, rash, or wounds. Denies dizziness, headaches, numbness or seizures. Denies swollen lymph nodes or glands. Denies confusion or decreased concentration.  Physical Exam: There were no vitals taken for this visit. Deferred given nature of phone visit  Laboratory & Radiologic Studies: MRI pelvis 1/15:  IMPRESSION: 1. Interval decrease in size of complex cystic lesion in the right ovary, consistent with benign etiology. 2. Stable small complex cystic in lesion in left ovary.  Last pap 12/2017 - negative, HPV negative  Assessment & Plan: Megan Roberson is a 64 y.o. woman with a complex past medical history who is being followed for bilateral adnexal masses, overall decreased/stable in size since last imaging.    Reviewed patient's recent MRI with  her.  The right ovarian cyst is decreased in size from prior imaging and the left is stable.  Given these findings, I think this is most likely a benign process.  The patient denies any symptoms today related to these small ovarian masses.  We discussed possible management plans given her high surgical risk.  I think it is most appropriate to continue with surveillance.  I recommended a phone visit in 6 months with a discussion at that time regarding repeat imaging.  I think it  would be reasonable to repeat imaging in 6 to 12 months once more to assure no significant change in either ovarian mass.  Patient was amenable with this.   I discussed the assessment and treatment plan with the patient. The patient was provided with an opportunity to ask questions and all were answered. The patient agreed with the plan and demonstrated an understanding of the instructions.   The patient was advised to call back or see an in-person evaluation if the symptoms worsen or if the condition fails to improve as anticipated.   I provided 12 minutes of non face-to-face telephone visit time during this encounter, and > 50% was spent counseling as documented under my assessment & plan.  Jeral Pinch, MD  Division of Gynecologic Oncology  Department of Obstetrics and Gynecology  [facility]

## 2019-11-24 NOTE — Progress Notes (Deleted)
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  CT A/P on 07/07/18 after a fall showed a right adnexal mass measuring 5.0 cm.  The left adnexal region was unremarkable and there is no pelvic fluid. Pelvic ultrasound on 12/31/08: The right ovary is noted to measure 5.1 x 3.8 x 5.2 cm and replaced by a 5.1 x 3.5 x 4.8 mildly complex cystic mass with thin internal septations.  There is no mural nodularity and no internal vascularity without significant change since July 07, 2018.  The left ovary measures 2.3 x 2.4 x 2.3 cm there is a simple left ovarian cyst the largest measuring 1.7 x 1.6 x 1.6 cm.  No abnormal free fluid was appreciated. MRI pelvis 04/2019: A cystic lesion on the right with multiple thin internal septations is seen, which measures 4.8 x 3.5 x 3.4 cm. Internal septation show mild contrast enhancement, but no thickened septations or solid mural nodules visualized. This shows mild increase in size from 3.4 cm on 11/28/2017 CT. Left ovary has a complex cystic lesion containing several thin enhancing internal septations. This measures 2.4 x 2.0 x 1.6 cm, and shows no evidence of thickened septations or solid mural nodules. This appears new compared with earlier CT on 11/28/2017. No peritoneal thickening or abnormal free fluid. CA-125 on 04/11/19: 22.5 MRI pelvis on 11/17/19: Right ovary has a complex cyst again seen containing multiple thin internal septations, but no evidence of contrast enhancement or solid component. This is decreased in size since previous study, currently measuring 3.1 x 2.2 cm on image 17/5, compared to 4.5 x 3.4 cm previously. Left ovary has a complex cystic lesion with several thin internal septations is again seen. No evidence of contrast enhancement  or solid component. This currently measures 2.2 x 2.2 cm, compared to 2.1 x 1.7 cm on prior study.  Interval History:  Not a whole lot worse, but not better Back back, elbow, knee, ankle --> multiple falls, mostly left side Occasional pelvic pain App decreased, no N/V BMs - still 2-3 a day, solid  Past Medical/Surgical History: Past Medical History:  Diagnosis Date  . Anxiety    pt. denies  . Cirrhosis of liver ([place])   . Depression    pt. denies  . Heart murmur    "not concerned about "   . History of abdominal paracentesis   . History of blood transfusion   . Hypothyroidism   . Ileus ([place])   . Liver disease 2008  . Neuropathy   . Osteoporosis 02/09/2018  . Pancytopenia ([place])   . Pneumonia   . Vaginal Pap smear, abnormal     Past Surgical History:  Procedure Laterality Date  . AUGMENTATION MAMMAPLASTY Bilateral    20 years ago  . BIOPSY  03/15/2018   Procedure: BIOPSY;  Surgeon: Yetta Flock, MD;  Location: [facility];  Service: Gastroenterology;;  Gastric  . COLONOSCOPY WITH ESOPHAGOGASTRODUODENOSCOPY (EGD) AND ESOPHAGEAL DILATION (ED)    . COLONOSCOPY WITH PROPOFOL N/A 03/15/2018   Procedure: COLONOSCOPY WITH PROPOFOL;  Surgeon: Yetta Flock, MD;  Location: [facility];  Service: Gastroenterology;  Laterality: N/A;  . ESOPHAGOGASTRODUODENOSCOPY (EGD) WITH PROPOFOL N/A 03/15/2018   Procedure: ESOPHAGOGASTRODUODENOSCOPY (EGD) WITH PROPOFOL;  Surgeon: Yetta Flock, MD;  Location: [facility];  Service: Gastroenterology;  Laterality: N/A;  . ESOPHAGOGASTRODUODENOSCOPY W/  BANDING     Varices  . FRACTURE SURGERY Left    x 2 L elbow and hip left   . HARDWARE REMOVAL Left 09/17/2017   Procedure: HARDWARE REMOVAL LEFT OLECRANON;  Surgeon: Altamese [place], MD;  Location: [facility];  Service: Orthopedics;  Laterality: Left;  . HIP SURGERY Left    from fracture- rod in palce  . IR PARACENTESIS  11/18/2017  . IR PARACENTESIS  11/26/2017  . IR PARACENTESIS   03/14/2018  . KNEE SURGERY Right    open incision for ligaments  . ORIF ELBOW FRACTURE Left 04/30/2017   Procedure: REVISION OPEN REDUCTION INTERNAL FIXATION (ORIF) ELBOW/OLECRANON FRACTURE;  Surgeon: Altamese [place]is[place], MD;  Location: [facility];  Service: Orthopedics;  Laterality: Left;    Family History  Adopted: Yes  Problem Relation Age of Onset  . Alcohol abuse Daughter     Social History   Socioeconomic History  . Marital status: Married    Spouse name: Not on file  . Number of children: 2  . Years of education: Not on file  . Highest education level: Not on file  Occupational History  . Occupation: disabled  Tobacco Use  . Smoking status: Never Smoker  . Smokeless tobacco: Never Used  Substance and Sexual Activity  . Alcohol use: No  . Drug use: No  . Sexual activity: Not Currently    Birth control/protection: Post-menopausal  Other Topics Concern  . Not on file  Social History Narrative  . Not on file   Social Determinants of Health   Financial Resource Strain:   . Difficulty of Paying Living Expenses: Not on file  Food Insecurity:   . Worried About Charity fundraiser in the Last Year: Not on file  . Ran Out of Food in the Last Year: Not on file  Transportation Needs:   . Lack of Transportation (Medical): Not on file  . Lack of Transportation (Non-Medical): Not on file  Physical Activity:   . Days of Exercise per Week: Not on file  . Minutes of Exercise per Session: Not on file  Stress:   . Feeling of Stress : Not on file  Social Connections:   . Frequency of Communication with Friends and Family: Not on file  . Frequency of Social Gatherings with Friends and Family: Not on file  . Attends Religious Services: Not on file  . Active Member of Clubs or Organizations: Not on file  . Attends Archivist Meetings: Not on file  . Marital Status: Not on file    Current Medications:  Current Outpatient Medications:  .  denosumab (PROLIA) 60 MG/ML SOSY  injection, Inject 60 mg into the skin every 6 (six) months., Disp: , Rfl:  .  furosemide (LASIX) 40 MG tablet, Take 1 tablet (40 mg total) by mouth 2 (two) times daily., Disp: 120 tablet, Rfl: 3 .  gabapentin (NEURONTIN) 100 MG capsule, Take 2 capsules (200 mg total) by mouth 3 (three) times daily. (Patient taking differently: Take 200 mg by mouth as needed. ), Disp: 90 capsule, Rfl: 0 .  lactulose (CHRONULAC) 10 GM/15ML solution, Take 30 mLs (20 g total) by mouth 3 (three) times daily. (Patient taking differently: Take 20 g by mouth daily as needed. ), Disp: 946 mL, Rfl: 0 .  levOCARNitine (CARNITOR) 330 MG tablet, Take 330 mg by mouth 3 (three) times daily., Disp: , Rfl:  .  levothyroxine (SYNTHROID, LEVOTHROID) 75 MCG tablet, Take 1 tablet (75 mcg total) by mouth daily  before breakfast., Disp: 30 tablet, Rfl: 0 .  omeprazole (PRILOSEC) 20 MG capsule, Take 1 capsule (20 mg total) by mouth 1 day or 1 dose for 1 dose., Disp: 1 capsule, Rfl: 0 .  ondansetron (ZOFRAN-ODT) 4 MG disintegrating tablet, Take 4 mg by mouth every 8 (eight) hours as needed for nausea or vomiting., Disp: , Rfl:  .  QUEtiapine (SEROQUEL) 25 MG tablet, Take 25 mg by mouth at bedtime. , Disp: , Rfl:  .  rifaximin (XIFAXAN) 550 MG TABS tablet, Take 1 tablet (550 mg total) by mouth 2 (two) times daily., Disp: 60 tablet, Rfl: 0 .  sertraline (ZOLOFT) 100 MG tablet, Take 100 mg by mouth daily. , Disp: , Rfl:  .  spironolactone (ALDACTONE) 100 MG tablet, Take 2 tablets (200 mg total) by mouth daily., Disp: 30 tablet, Rfl: 0 .  traZODone (DESYREL) 50 MG tablet, Take 50 mg at bedtime by mouth., Disp: , Rfl:   Review of Symptoms: Denies appetite changes, fevers, chills, fatigue, unexplained weight changes. Denies hearing loss, neck lumps or masses, mouth sores, ringing in ears or voice changes. Denies cough or wheezing.  Denies shortness of breath. Denies chest pain or palpitations. Denies leg swelling. Denies abdominal distention,  pain, blood in stools, constipation, diarrhea, nausea, vomiting, or early satiety. Denies pain with intercourse, dysuria, frequency, hematuria or incontinence. Denies hot flashes, pelvic pain, vaginal bleeding or vaginal discharge.   Denies joint pain, back pain or muscle pain/cramps. Denies itching, rash, or wounds. Denies dizziness, headaches, numbness or seizures. Denies swollen lymph nodes or glands, denies easy bruising or bleeding. Denies anxiety, depression, confusion, or decreased concentration.  Physical Exam: There were no vitals taken for this visit. General: ***Alert, oriented, no acute distress. HEENT: ***Posterior oropharynx clear, sclera anicteric. Chest: ***Clear to auscultation bilaterally.  ***Port site clean. Cardiovascular: ***Regular rate and rhythm, no murmurs. Abdomen: ***Obese, soft, nontender.  Normoactive bowel sounds.  No masses or hepatosplenomegaly appreciated.  ***Well-healed scar. Extremities: ***Grossly normal range of motion.  Warm, well perfused.  No edema bilaterally. Skin: ***No rashes or lesions noted. Lymphatics: ***No cervical, supraclavicular, or inguinal adenopathy. GU: Normal appearing external genitalia without erythe ma, excoriation, or lesions.  Speculum exam reveals ***.  Bimanual exam reveals ***.  ***Rectovaginal exam  confirms ___.  Laboratory & Radiologic Studies: MRI pelvis 1/15:  IMPRESSION: 1. Interval decrease in size of complex cystic lesion in the right ovary, consistent with benign etiology. 2. Stable small complex cystic in lesion in left ovary.  Assessment & Plan: Megan Roberson is a 64 y.o. woman with Stage *** who presents for ***.  - phone visit in 6 months - > asked patient to call  Jeral Pinch, MD  Division of Gynecologic Oncology  Department of Obstetrics and Gynecology  [facility]

## 2019-12-01 ENCOUNTER — Telehealth: Payer: Self-pay | Admitting: Gastroenterology

## 2019-12-01 ENCOUNTER — Other Ambulatory Visit (INDEPENDENT_AMBULATORY_CARE_PROVIDER_SITE_OTHER): Payer: Medicare HMO

## 2019-12-01 ENCOUNTER — Encounter: Payer: Self-pay | Admitting: Gastroenterology

## 2019-12-01 ENCOUNTER — Ambulatory Visit (INDEPENDENT_AMBULATORY_CARE_PROVIDER_SITE_OTHER): Payer: Medicare HMO | Admitting: Gastroenterology

## 2019-12-01 VITALS — BP 118/68 | HR 80 | Temp 98.5°F | Ht 67.0 in | Wt 146.4 lb

## 2019-12-01 DIAGNOSIS — K7460 Unspecified cirrhosis of liver: Secondary | ICD-10-CM

## 2019-12-01 DIAGNOSIS — I8510 Secondary esophageal varices without bleeding: Secondary | ICD-10-CM | POA: Diagnosis not present

## 2019-12-01 DIAGNOSIS — R2681 Unsteadiness on feet: Secondary | ICD-10-CM

## 2019-12-01 DIAGNOSIS — R188 Other ascites: Secondary | ICD-10-CM

## 2019-12-01 DIAGNOSIS — D696 Thrombocytopenia, unspecified: Secondary | ICD-10-CM

## 2019-12-01 DIAGNOSIS — I8500 Esophageal varices without bleeding: Secondary | ICD-10-CM

## 2019-12-01 DIAGNOSIS — K7682 Hepatic encephalopathy: Secondary | ICD-10-CM

## 2019-12-01 DIAGNOSIS — Z9181 History of falling: Secondary | ICD-10-CM | POA: Diagnosis not present

## 2019-12-01 DIAGNOSIS — K7290 Hepatic failure, unspecified without coma: Secondary | ICD-10-CM

## 2019-12-01 HISTORY — DX: Thrombocytopenia, unspecified: D69.6

## 2019-12-01 LAB — CBC WITH DIFFERENTIAL/PLATELET
Basophils Absolute: 0 10*3/uL (ref 0.0–0.1)
Basophils Relative: 0.7 % (ref 0.0–3.0)
Eosinophils Absolute: 0.1 10*3/uL (ref 0.0–0.7)
Eosinophils Relative: 1.6 % (ref 0.0–5.0)
HCT: 36.3 % (ref 36.0–46.0)
Hemoglobin: 12 g/dL (ref 12.0–15.0)
Lymphocytes Relative: 12.7 % (ref 12.0–46.0)
Lymphs Abs: 0.8 10*3/uL (ref 0.7–4.0)
MCHC: 33 g/dL (ref 30.0–36.0)
MCV: 79.4 fl (ref 78.0–100.0)
Monocytes Absolute: 0.9 10*3/uL (ref 0.1–1.0)
Monocytes Relative: 14.3 % — ABNORMAL HIGH (ref 3.0–12.0)
Neutro Abs: 4.5 10*3/uL (ref 1.4–7.7)
Neutrophils Relative %: 70.7 % (ref 43.0–77.0)
Platelets: 41 10*3/uL — CL (ref 150.0–400.0)
RBC: 4.58 Mil/uL (ref 3.87–5.11)
RDW: 20.8 % — ABNORMAL HIGH (ref 11.5–15.5)
WBC: 6.4 10*3/uL (ref 4.0–10.5)

## 2019-12-01 LAB — COMPREHENSIVE METABOLIC PANEL
ALT: 22 U/L (ref 0–35)
AST: 61 U/L — ABNORMAL HIGH (ref 0–37)
Albumin: 3.5 g/dL (ref 3.5–5.2)
Alkaline Phosphatase: 128 U/L — ABNORMAL HIGH (ref 39–117)
BUN: 10 mg/dL (ref 6–23)
CO2: 33 mEq/L — ABNORMAL HIGH (ref 19–32)
Calcium: 9.2 mg/dL (ref 8.4–10.5)
Chloride: 97 mEq/L (ref 96–112)
Creatinine, Ser: 0.86 mg/dL (ref 0.40–1.20)
GFR: 67.04 mL/min (ref >60.00)
Glucose, Bld: 97 mg/dL (ref 70–99)
Potassium: 2.9 mEq/L — ABNORMAL LOW (ref 3.5–5.1)
Sodium: 138 mEq/L (ref 135–145)
Total Bilirubin: 3.7 mg/dL — ABNORMAL HIGH (ref 0.2–1.2)
Total Protein: 6.4 g/dL (ref 6.0–8.3)

## 2019-12-01 LAB — PROTIME-INR
INR: 1.4 ratio — ABNORMAL HIGH (ref 0.8–1.0)
Prothrombin Time: 16.5 s — ABNORMAL HIGH (ref 9.6–13.1)

## 2019-12-01 MED ORDER — LACTULOSE 10 GM/15ML PO SOLN: 20.0000 g | Freq: Two times a day (BID) | ORAL | 0 refills | Status: DC

## 2019-12-01 NOTE — Progress Notes (Signed)
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  See prior clinic notes for full details of her case.  She has decompensations to include ascites, hepatic encephalopathy and bleeding varices in the remote past.  She has been followed by the hepatology transplant clinic in [facility] by Dr. Zollie Scale.  Due to her comorbidities and multiple falls, fragility, has not been worked up for transplant yet.  I have not seen her since April 2020.  Her main complaint is severe imbalance with her gait.  She continues to have frequent falls.  She most recently had a severe fall within the past week and has extensive bruising on her left side.  She denies any numbness in her feet, just states she cannot get her balance.  Husband thinks encephalopathy is otherwise reasonably well controlled.  She takes lactulose but only about once per day.  If she has more of it she will have more urgent stools and can precipitate falls going to the bathroom.  She does take rifaximin twice a day however her insurance is going to no longer be covering this medication and she is quite concerned about that.  She is taking Lasix 40 mg twice a day and Aldactone 200 mg a day, both working fairly well to control ascites and lower extremity edema.  In regards to her varices her last EGD was in May 2019 and she had trace varices as well as gastritis and esophageal candidiasis.  She has not been on beta-blockade due to her ascites which has been difficult to manage.  She also has severe chronic thrombocytopenia with platelets of the 30s.  She required Doptelet for her last EGD.  She has not been thought to be a good candidate for TIPS in the past due to her encephalopathy.  She was last seen by hepatology this past August.  She has not been seen by neurology although this consultation has been recommended in the past for her recurrent falls and gait abnormality.  She otherwise is not drinking any  alcohol.  She is compliant with her medications as outlined otherwise.  Most recent workup:   EGD 03/15/2018 - Esophagogastric landmarks identified. - Trace to small esophageal varices without high risk stigmata - banding not performed in light of small size and thrombocytopenia. - Multiple white plaques in the esophagus. Brushings performed to rule out candidiasis. - Non-bleeding small gastric ulcer with no stigmata of bleeding. - Gastritis. Biopsied to rule out H pylori - Normal duodenal bulb and second portion of the duodenum.,   Colonoscopy 03/15/18 -  The perianal and digital rectal examinations were normal. Findings: The colon was redundant with severe looping. The entire colon was severely congested / edematous, consistent with changes due to portal hypertension. Most severe in the sigmoid colon which made visualization difficut. Due to severe looping / redundancy / edema, initial attempt at cecal intubation with pediatric colonoscope was not succesfull. The scope was exchanged for an adult colonoscope. The suspected ascending colon was reached but cecal intubation was not successful despite multiple attempts / abdominal pressure. Internal hemorrhoids were found during retroflexion.   CT scan abdomen 07/07/2018 - multiple vertebral fractures, fracture of right rib, remote pelvic fractures, "inflammation of the ascending colon", R adnexal cystic lesion  Virtual colonoscopy 04/24/19 - IMPRESSION: No significant colonic polyp, mass, apple core lesion, or stricture.  Cirrhosis. Splenomegaly. Cholelithiasis. 14 mm nonobstructing right lower pole renal calculus, without hydronephrosis. 4.6 cm right ovarian cystic lesion,  better evaluated on recent pelvic MRI.   [place] RUQ 07/07/19 - IMPRESSION: Findings consistent with hepatic cirrhosis. Cholelithiasis is noted without evidence of cholecystitis  MRI Pelvis 11/17/19 - IMPRESSION: 1. Interval decrease in size of complex cystic lesion in  the right ovary, consistent with benign etiology. 2. Stable small complex cystic in lesion in left ovary. 3. Tiny amount of free pelvic fluid. 4. Recommend continued follow-up by MRI in 6 months   Past Medical History:  Diagnosis Date  . Anxiety    pt. denies  . Cirrhosis of liver ([place])   . Depression    pt. denies  . Heart murmur    "not concerned about "   . History of abdominal paracentesis   . History of blood transfusion   . Hypothyroidism   . Ileus ([place])   . Liver disease 2008  . Neuropathy   . Osteoporosis 02/09/2018  . Pancytopenia ([place])   . Pneumonia   . Vaginal Pap smear, abnormal      Past Surgical History:  Procedure Laterality Date  . AUGMENTATION MAMMAPLASTY Bilateral    20 years ago  . BIOPSY  03/15/2018   Procedure: BIOPSY;  Surgeon: Yetta Flock, MD;  Location: [facility];  Service: Gastroenterology;;  Gastric  . COLONOSCOPY WITH ESOPHAGOGASTRODUODENOSCOPY (EGD) AND ESOPHAGEAL DILATION (ED)    . COLONOSCOPY WITH PROPOFOL N/A 03/15/2018   Procedure: COLONOSCOPY WITH PROPOFOL;  Surgeon: Yetta Flock, MD;  Location: [facility];  Service: Gastroenterology;  Laterality: N/A;  . ESOPHAGOGASTRODUODENOSCOPY (EGD) WITH PROPOFOL N/A 03/15/2018   Procedure: ESOPHAGOGASTRODUODENOSCOPY (EGD) WITH PROPOFOL;  Surgeon: Yetta Flock, MD;  Location: [facility];  Service: Gastroenterology;  Laterality: N/A;  . ESOPHAGOGASTRODUODENOSCOPY W/ BANDING     Varices  . FRACTURE SURGERY Left    x 2 L elbow and hip left   . HARDWARE REMOVAL Left 09/17/2017   Procedure: HARDWARE REMOVAL LEFT OLECRANON;  Surgeon: Altamese Bayside, MD;  Location: [facility];  Service: Orthopedics;  Laterality: Left;  . HIP SURGERY Left    from fracture- rod in palce  . IR PARACENTESIS  11/18/2017  . IR PARACENTESIS  11/26/2017  . IR PARACENTESIS  03/14/2018  . KNEE SURGERY Right    open incision for ligaments  . ORIF ELBOW FRACTURE Left 04/30/2017   Procedure: REVISION OPEN  REDUCTION INTERNAL FIXATION (ORIF) ELBOW/OLECRANON FRACTURE;  Surgeon: Altamese Orient, MD;  Location: [facility];  Service: Orthopedics;  Laterality: Left;   Family History  Adopted: Yes  Problem Relation Age of Onset  . Alcohol abuse Daughter    Social History   Tobacco Use  . Smoking status: Never Smoker  . Smokeless tobacco: Never Used  Substance Use Topics  . Alcohol use: No  . Drug use: No   Current Outpatient Medications  Medication Sig Dispense Refill  . denosumab (PROLIA) 60 MG/ML SOSY injection Inject 60 mg into the skin every 6 (six) months.    . furosemide (LASIX) 40 MG tablet Take 1 tablet (40 mg total) by mouth 2 (two) times daily. 120 tablet 3  . gabapentin (NEURONTIN) 100 MG capsule Take 2 capsules (200 mg total) by mouth 3 (three) times daily. (Patient taking differently: Take 200 mg by mouth as needed. ) 90 capsule 0  . lactulose (CHRONULAC) 10 GM/15ML solution Take 30 mLs (20 g total) by mouth 3 (three) times daily. (Patient taking differently: Take 20 g by mouth daily as needed. ) 946 mL 0  . levOCARNitine (CARNITOR) 330 MG tablet Take 330  mg by mouth 3 (three) times daily.    Marland Kitchen levothyroxine (SYNTHROID, LEVOTHROID) 75 MCG tablet Take 1 tablet (75 mcg total) by mouth daily before breakfast. 30 tablet 0  . ondansetron (ZOFRAN-ODT) 4 MG disintegrating tablet Take 4 mg by mouth every 8 (eight) hours as needed for nausea or vomiting.    Marland Kitchen QUEtiapine (SEROQUEL) 25 MG tablet Take 25 mg by mouth at bedtime.     . rifaximin (XIFAXAN) 550 MG TABS tablet Take 1 tablet (550 mg total) by mouth 2 (two) times daily. 60 tablet 0  . sertraline (ZOLOFT) 100 MG tablet Take 100 mg by mouth daily.     Marland Kitchen spironolactone (ALDACTONE) 100 MG tablet Take 2 tablets (200 mg total) by mouth daily. 30 tablet 0  . traZODone (DESYREL) 50 MG tablet Take 50 mg at bedtime by mouth.    Marland Kitchen omeprazole (PRILOSEC) 20 MG capsule Take 1 capsule (20 mg total) by mouth 1 day or 1 dose for 1 dose. 1 capsule 0   No  current facility-administered medications for this visit.   Allergies  Allergen Reactions  . Penicillins Swelling and Other (See Comments)    FACIAL SWELLING  PATIENT HAS HAD A PCN REACTION WITH IMMEDIATE RASH, FACIAL/TONGUE/THROAT SWELLING, SOB, OR LIGHTHEADEDNESS WITH HYPOTENSION:  #  #  #  YES  #  #  #   Has patient had a PCN reaction causing severe rash involving mucus membranes or skin necrosis: No Has patient had a PCN reaction that required hospitalization: No Has patient had a PCN reaction occurring within the last 10 years: No If all of the above answers are "NO", then may proceed with Cephalosporin u  . Tylenol [Acetaminophen] Nausea Only  . Sulfa Antibiotics Itching and Rash  . Tramadol Swelling and Rash     Review of Systems: All systems reviewed and negative except where noted in HPI.    MR Pelvis W Wo Contrast  Result Date: 11/17/2019 CLINICAL DATA:  Follow-up bilateral ovarian complex cystic lesions. Postmenopausal female. EXAM: MRI PELVIS WITHOUT AND WITH CONTRAST TECHNIQUE: Multiplanar multisequence MR imaging of the pelvis was performed both before and after administration of intravenous contrast. CONTRAST:  7.78m GADAVIST GADOBUTROL 1 MMOL/ML IV SOLN COMPARISON:  04/17/2019 FINDINGS: Lower Urinary Tract: No urinary bladder or urethral abnormality identified. Bowel: Unremarkable appearance of rectum and other pelvic bowel loops. Vascular/Lymphatic: Unremarkable. No pathologically enlarged pelvic lymph nodes identified. Reproductive: -- Uterus: Measures 5.8 x 2.7 x 4.1 cm (volume = 34 cm^3). Normal postmenopausal appearance. No fibroids or other masses identified. Cervix and vagina are unremarkable. -- Right ovary: A complex cyst is again seen containing multiple thin internal septations, but no evidence of contrast enhancement or solid component. This is decreased in size since previous study, currently measuring 3.1 x 2.2 cm on image 17/5, compared to 4.5 x 3.4 cm  previously. -- Left ovary: A complex cystic lesion with several thin internal septations is again seen. No evidence of contrast enhancement or solid component. This currently measures 2.2 x 2.2 cm, compared to 2.1 x 1.7 cm on prior study. Other: Tiny amount of free pelvic fluid noted. Musculoskeletal:  Unremarkable. IMPRESSION: 1. Interval decrease in size of complex cystic lesion in the right ovary, consistent with benign etiology. 2. Stable small complex cystic in lesion in left ovary. 3. Tiny amount of free pelvic fluid. 4. Recommend continued follow-up by MRI in 6 months. Electronically Signed   By: JMarlaine HindM.D.   On: 11/17/2019 16:28  Physical Exam: BP 118/68   Pulse 80   Temp 98.5 F (36.9 C)   Ht 5' 7"  (1.702 m)   Wt 146 lb 6.4 oz (66.4 kg)   BMI 22.93 kg/m  Constitutional: Pleasant, female in no acute distress. HEENT: Normocephalic and atraumatic. Conjunctivae are normal. No scleral icterus. Neck supple.  Cardiovascular: Normal rate, regular rhythm.  Pulmonary/chest: Effort normal and breath sounds normal.  Abdominal: Soft, nondistended, nontender.  There are no masses palpable.  Bruising along left flank and lower back Extremities: no edema Lymphadenopathy: No cervical adenopathy noted. Neurological: Alert and oriented to person place and time. Skin: Skin is warm and dry.  Psychiatric: Normal mood and affect. Behavior is normal.   ASSESSMENT AND PLAN: 64 y/o female here for reassessment of the following issues:  Cirrhosis with ascites / hepatic encephalopathy / esophageal varices / history of falls / thrombocytopenia - as above, decompensated cirrhosis with ascites, hepatic encephalopathy, esophageal varices with remote bleeding.  She has been followed by hepatology but does not meet transplant criteria yet.  She remains quite frail with frequent falls and history of fractures.  Her hepatic encephalopathy is being treated although while I do not think she is taking enough  lactulose, I do think she should increase this to twice a day although she has to be very careful to avoid falls when using the bathroom.  Given her ongoing falls I am going to refer her to neurology to ensure there is no other process going on that could be contributing to this.  They are agreeable with this.  Otherwise she is on medication regimen as outlined above, her ascites is being managed on the high-dose diuretics.  She is very overdue for labs to ensure her renal function stable and will send that today.  She has not been a candidate for TIPS due to her encephalopathy. We have held off on beta blockade given issues with managing her ascites in the past.  She is due for an EGD this year for surveillance of the varices.  She has required Doptelet in the past due to severe thrombocytopenia. If she has large varices she is higher than average risk for bleeding. We may consider putting her on beta blockade as her ascites has been well managed on this regimen but will await her renal function.  I will recheck her CBC and INR to see where she is in regards to those metrics today.  We will also send AFP level.  She is due for surveillance ultrasound in March.  She needs to take rifaximin twice a day however insurance is not going to be covering this in the near future.  We helped her fill out an application for assistance for rifaximin today with the drug company, will see if this gets approved.  I will need to continue to see her every 6 months, she will continue to follow-up with hepatology periodically as well.  I will await her labs first before we consider pursuing surveillance endoscopy. She agreed.   I spent 35 minutes of time, including in depth chart review, independent review of results as outlined above, communicating results with the patient directly, face-to-face time with the patient, coordinating care, and ordering studies and medications as appropriate, and documenting this encounter.   [place] Cellar, MD [facility]

## 2019-12-01 NOTE — Telephone Encounter (Signed)
LBGI MD: [place]  After hours call from the lab for a critical lab value: Platelets 41,000  Review of labs show chronic thrombocytopenia with platelets 30,000-50,000.   Will send to Dr. Havery Moros to make him aware.

## 2019-12-01 NOTE — Telephone Encounter (Signed)
Thanks Joelene Millin, yes this is a chronic issue, sorry they bothered you

## 2019-12-01 NOTE — Patient Instructions (Addendum)
If you are age 62 or older, your body mass index should be between 23-30. Your Body mass index is 22.93 kg/m. If this is out of the aforementioned range listed, please consider follow up with your Primary Care Provider.  If you are age 5 or younger, your body mass index should be between 19-25. Your Body mass index is 22.93 kg/m. If this is out of the aformentioned range listed, please consider follow up with your Primary Care Provider.   Please go to the lab in the basement of our building to have lab work done as you leave today. Hit "B" for basement when you get on the elevator.  When the doors open the lab is on your left.  We will call you with the results. Thank you.  We will refer you to [facility].  They will contact you to schedule an appointment. Please let us know if you have not heard from them within 2 weeks.  Please take lactulose TWICE a day.  We are giving you the [facility] PAP Program Application Form that you will need to complete to apply for Patient Assistance for [place].  We have completed the sections for "provider" and attached a list of your medications and allergies. You will need to complete the rest of the sections and mail to [place] with the necessary documents.  Their number is [phone]. Their fax is [phone].  Thank you for entrusting me with your care and for choosing [facility], Dr. [place] Cellar

## 2019-12-04 LAB — AFP TUMOR MARKER: AFP-Tumor Marker: 2 ng/mL

## 2019-12-05 ENCOUNTER — Encounter: Payer: Self-pay | Admitting: Neurology

## 2019-12-05 ENCOUNTER — Other Ambulatory Visit: Payer: Self-pay | Admitting: Gastroenterology

## 2019-12-05 ENCOUNTER — Telehealth: Payer: Self-pay | Admitting: Gastroenterology

## 2019-12-05 DIAGNOSIS — R188 Other ascites: Secondary | ICD-10-CM

## 2019-12-05 DIAGNOSIS — K7460 Unspecified cirrhosis of liver: Secondary | ICD-10-CM

## 2019-12-05 NOTE — Telephone Encounter (Signed)
Thanks. I called her back again, she did not answer, left message. I am trying to relay lab results and discuss long term plan with her. I will call her back again later.

## 2019-12-05 NOTE — Telephone Encounter (Signed)
Patient called and said Dr. Havery Moros called her and wanted her to increase one of her meds. I don't see your note. Please advise what I should tell the patient.

## 2019-12-07 DIAGNOSIS — S63659A Sprain of metacarpophalangeal joint of unspecified finger, initial encounter: Secondary | ICD-10-CM | POA: Diagnosis not present

## 2019-12-12 ENCOUNTER — Telehealth: Payer: Self-pay

## 2019-12-12 ENCOUNTER — Telehealth: Payer: Self-pay | Admitting: Gastroenterology

## 2019-12-12 NOTE — Telephone Encounter (Signed)
Patient is calling says she has an appt for tomorrow for hand surgery and she wanted to know or ask Dr. Havery Roberson if he feels it is ok for her to proceed with the procedure.

## 2019-12-12 NOTE — Telephone Encounter (Signed)
Returned patient's phone call, got voice mail. Left message to please call back again

## 2019-12-13 NOTE — Telephone Encounter (Signed)
Called patient back again and was able to reach her. She states she has hand surgery coming up and wanted to know if Dr. Havery Roberson thought it would be OK. She is going to get more information and call me back hopefully tomorrow

## 2019-12-14 ENCOUNTER — Other Ambulatory Visit: Payer: Self-pay | Admitting: Orthopedic Surgery

## 2019-12-25 DIAGNOSIS — G8929 Other chronic pain: Secondary | ICD-10-CM | POA: Diagnosis not present

## 2019-12-25 DIAGNOSIS — M25561 Pain in right knee: Secondary | ICD-10-CM | POA: Diagnosis not present

## 2019-12-25 DIAGNOSIS — M25552 Pain in left hip: Secondary | ICD-10-CM | POA: Diagnosis not present

## 2019-12-25 DIAGNOSIS — Z79899 Other long term (current) drug therapy: Secondary | ICD-10-CM | POA: Diagnosis not present

## 2019-12-25 DIAGNOSIS — M545 Low back pain: Secondary | ICD-10-CM | POA: Diagnosis not present

## 2019-12-26 ENCOUNTER — Other Ambulatory Visit (HOSPITAL_COMMUNITY)
Admission: RE | Admit: 2019-12-26 | Discharge: 2019-12-26 | Disposition: A | Discharge status: Home / Self Care | Payer: Medicare HMO | Source: Ambulatory Visit | Attending: Orthopedic Surgery | Admitting: Orthopedic Surgery

## 2019-12-26 ENCOUNTER — Other Ambulatory Visit: Payer: Self-pay

## 2019-12-27 ENCOUNTER — Other Ambulatory Visit: Payer: Self-pay

## 2019-12-27 ENCOUNTER — Other Ambulatory Visit (HOSPITAL_COMMUNITY)
Admission: RE | Admit: 2019-12-27 | Discharge: 2019-12-27 | Disposition: A | Discharge status: Home / Self Care | Payer: Medicare HMO | Source: Ambulatory Visit | Attending: Orthopedic Surgery | Admitting: Orthopedic Surgery

## 2019-12-27 ENCOUNTER — Other Ambulatory Visit: Payer: Self-pay | Admitting: Orthopedic Surgery

## 2019-12-27 ENCOUNTER — Other Ambulatory Visit: Payer: Medicare HMO

## 2019-12-27 ENCOUNTER — Telehealth: Payer: Self-pay

## 2019-12-27 ENCOUNTER — Encounter (HOSPITAL_COMMUNITY): Payer: Self-pay | Admitting: Orthopedic Surgery

## 2019-12-27 DIAGNOSIS — R188 Other ascites: Secondary | ICD-10-CM

## 2019-12-27 DIAGNOSIS — Z01812 Encounter for preprocedural laboratory examination: Secondary | ICD-10-CM | POA: Diagnosis not present

## 2019-12-27 DIAGNOSIS — K7460 Unspecified cirrhosis of liver: Secondary | ICD-10-CM

## 2019-12-27 DIAGNOSIS — Z20822 Contact with and (suspected) exposure to covid-19: Secondary | ICD-10-CM | POA: Diagnosis not present

## 2019-12-27 DIAGNOSIS — K7682 Hepatic encephalopathy: Secondary | ICD-10-CM

## 2019-12-27 DIAGNOSIS — K7290 Hepatic failure, unspecified without coma: Secondary | ICD-10-CM

## 2019-12-27 LAB — SARS CORONAVIRUS 2 (TAT 6-24 HRS): SARS Coronavirus 2: NEGATIVE

## 2019-12-27 NOTE — Telephone Encounter (Signed)
Scheduled pt for RUQ ultrasound at [facility] on Tuesday, 01-09-20 at 9:30am. To arrive at 9:15am, NPO after midnight.  Called and spoke to patient. She expressed understanding.  Mailed pt letter with instructions.

## 2019-12-27 NOTE — Telephone Encounter (Signed)
-----   Message from Roetta Sessions, [place] sent at 12/01/2019  5:21 PM EST ----- Regarding: RUQ ultrasound Pt due for Ultrasound in early March - cirrhosis

## 2019-12-28 ENCOUNTER — Other Ambulatory Visit: Payer: Self-pay

## 2019-12-28 ENCOUNTER — Encounter (HOSPITAL_COMMUNITY): Payer: Self-pay | Admitting: Orthopedic Surgery

## 2019-12-28 NOTE — Progress Notes (Signed)
Anesthesia Chart Review: SAME DAY WORK-UP   Case: [id number] Date/Time: 12/29/19 0915   Procedure: LEFT LONG AND LEFT RING FRINGER EXTENSOR REALIGNAMENT WITH POSSIBLE TENDON TRANSFER (Left )   Anesthesia type: Monitor Anesthesia Care   Pre-op diagnosis: LEFT LONG AND LEFT RING FINGER, EXTENSOR TENDON INSTABILITY   Location: [place] OR ROOM 06 / [place] [facility]   Surgeons: Charlotte Crumb, MD      DISCUSSION: Patient is a 64 year old female scheduled for the above procedure.  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  Known cirrhosis with significant thrombocytopenia--PLT count ~ 30-40K's over the past 2 years. Last labs are fairly recent from 12/01/19. Reviewed with anesthesiologist Roberts Gaudy, MD. Patient will be further evaluated on the day of surgery. If no acute changes then he felt she would likely only need preoperative CBC given labs within the past month. 12/27/2019 presurgical COVID-19 test was negative.   VS:   BP Readings from Last 3 Encounters:  12/01/19 118/68  06/15/19 107/71  04/11/19 125/72   Pulse Readings from Last 3 Encounters:  12/01/19 80  06/15/19 83  04/11/19 72    PROVIDERS: Celene Squibb, MD is PCP - Hinckley Cellar, MID is GI. Last evaluation 12/01/19. Notes indicate she was not thought to be a good candidate for TIPS in the past due to her encephalopathy.  She has not been on beta-blockade given issues with managing her ascites.  She does not yet meet transplant criteria.  She is due for surveillance EGD in the spring.  She was referred to neurology due to recurrent falls (not  sure that ataxia/recurrent falls can be completely attributed to hepatic encephalopathy). Bjorn Pippin, MD is Hepatologist. Primarily sees [place], Arrie Aran, NP [facility]). 06/20/19 note is scanned under Media tab.  Jeral Pinch, MD is GYN-ONC. Last evaluation 11/24/19. - She is scheduled for neurology evaluation 01/15/20 with Metta Clines, DO.    LABS: She is for updated labs. She has known significant thrombocytopenia with PLT count range of 30-48K since April 2019.  Lab Results  Component Value Date   WBC 6.4 12/01/2019   HGB 12.0 12/01/2019   HCT 36.3 12/01/2019   PLT 41.0 Repeated and verified X2. (LL) 12/01/2019   GLUCOSE 97 12/01/2019   ALT 22 12/01/2019   AST 61 (H) 12/01/2019   NA 138 12/01/2019   K 2.9 (L) 12/01/2019   CL 97 12/01/2019   CREATININE 0.86 12/01/2019   BUN 10 12/01/2019   CO2 33 (H) 12/01/2019   INR 1.4 (H) 12/01/2019     OTHER: Virtual colonoscopy 04/24/19 IMPRESSION: No significant colonic polyp, mass, apple core lesion, or stricture.  EGD 03/15/2018  -Esophagogastric landmarks identified. - Trace to small esophageal varices without high risk stigmata - banding not performed in light of small size and thrombocytopenia. - Multiple white plaques in the esophagus. Brushings performed to rule out candidiasis. - Non-bleeding small gastric ulcer with no stigmata of bleeding. - Gastritis. Biopsied to rule out H pylori - Normal duodenal bulb and second portion of the duodenum.,   Colonoscopy 03/15/18 - The perianal and digital rectal examinations were normal. Findings: The colon was redundant with severe looping. The entire colon was  severely congested / edematous, consistent with changes due to portal hypertension. Most severe in the sigmoid colon which made visualization difficut. Due to severe looping / redundancy / edema, initial attempt at cecal intubation with pediatric colonoscope was not succesfull. The scope was exchanged for an  adult colonoscope. The suspected ascending colon was reached but cecal intubation was not successful despite multiple attempts / abdominal pressure. Internal hemorrhoids were found during retroflexion.    IMAGES: MRI Pelvis 11/17/19  IMPRESSION: 1. Interval decrease in size of complex cystic lesion in the right ovary, consistent with benign etiology. 2. Stable small complex cystic in lesion in left ovary. 3. Tiny amount of free pelvic fluid. 4. Recommend continued follow-up by MRI in 6 months  [place] RUQ 07/07/19 - IMPRESSION: Findings consistent with hepatic cirrhosis. Cholelithiasis is noted without evidence of cholecystitis  CXR 05/08/19: IMPRESSION: Bibasilar atelectasis greater on RIGHT with minimal RIGHT pleural effusion versus thickening. No definite acute bony findings.   EKG: Currently, last EKG seen is from 11/25/17 and showed NSR, non-specific T wave abnormality.   CV: Echo 10/06/17 [facility]): SUMMARY The left ventricular size is normal. Basal septal hypertrophy (normal variant). Left ventricular systolic function is normal. LV ejection fraction = 65-70%. The right ventricle is normal in size and function. The left atrium is mildly dilated. Right atrial size is normal. There is no significant valvular stenosis or regurgitation The aortic root is normal size. There was insufficient TR detected to calculate RV systolic pressure. Estimated right atrial pressure is 5 mmHg.Marland Kitchen There is no pericardial effusion. There is no comparison study available.   Past Medical History:  Diagnosis Date  . Anxiety    pt. denies  . Cirrhosis of liver ([place])   . Depression    pt. denies  . Heart murmur    "not concerned about "   . History of abdominal paracentesis   . History of blood transfusion   . Hypothyroidism   . Ileus ([place])   . Liver disease 2008  . Neuropathy   . Osteoporosis 02/09/2018  . Pancytopenia ([place])   . Pneumonia   . Thrombocytopenia ([place]) 12/01/2019  .  Vaginal Pap smear, abnormal     Past Surgical History:  Procedure Laterality Date  . AUGMENTATION MAMMAPLASTY Bilateral    20 years ago  . BIOPSY  03/15/2018   Procedure: BIOPSY;  Surgeon: Yetta Flock, MD;  Location: [facility];  Service: Gastroenterology;;  Gastric  . COLONOSCOPY WITH ESOPHAGOGASTRODUODENOSCOPY (EGD) AND ESOPHAGEAL DILATION (ED)    . COLONOSCOPY WITH PROPOFOL N/A 03/15/2018   Procedure: COLONOSCOPY WITH PROPOFOL;  Surgeon: Yetta Flock, MD;  Location: [facility];  Service: Gastroenterology;  Laterality: N/A;  . ESOPHAGOGASTRODUODENOSCOPY (EGD) WITH PROPOFOL N/A 03/15/2018   Procedure: ESOPHAGOGASTRODUODENOSCOPY (EGD) WITH PROPOFOL;  Surgeon: Yetta Flock, MD;  Location: [facility];  Service: Gastroenterology;  Laterality: N/A;  . ESOPHAGOGASTRODUODENOSCOPY W/ BANDING     Varices  . FRACTURE SURGERY Left    x 2 L elbow and hip left   . HARDWARE REMOVAL Left 09/17/2017   Procedure: HARDWARE REMOVAL LEFT OLECRANON;  Surgeon: Altamese Yeoman, MD;  Location: [place];  Service: Orthopedics;  Laterality: Left;  . HIP SURGERY Left    from fracture- rod in palce  . IR PARACENTESIS  11/18/2017  . IR PARACENTESIS  11/26/2017  . IR PARACENTESIS  03/14/2018  . KNEE SURGERY Right    open incision for ligaments  . ORIF ELBOW FRACTURE Left 04/30/2017   Procedure: REVISION OPEN  REDUCTION INTERNAL FIXATION (ORIF) ELBOW/OLECRANON FRACTURE;  Surgeon: Altamese [place], MD;  Location: [facility];  Service: Orthopedics;  Laterality: Left;    MEDICATIONS: No current facility-administered medications for this encounter.   Marland Kitchen acetaminophen (TYLENOL) 500 MG tablet  . denosumab (PROLIA) 60 MG/ML SOSY injection  . furosemide (LASIX) 80 MG tablet  . lactulose (CHRONULAC) 10 GM/15ML solution  . levOCARNitine (CARNITOR) 330 MG tablet  . levothyroxine (SYNTHROID, LEVOTHROID) 75 MCG tablet  . ondansetron (ZOFRAN) 4 MG tablet  . oxyCODONE (OXY IR/ROXICODONE) 5 MG immediate  release tablet  . sertraline (ZOLOFT) 50 MG tablet  . spironolactone (ALDACTONE) 100 MG tablet  . traZODone (DESYREL) 50 MG tablet  . furosemide (LASIX) 40 MG tablet  . gabapentin (NEURONTIN) 100 MG capsule  . rifaximin (XIFAXAN) 550 MG TABS tablet  Not currently on gabapentin, furosemide,rifaximin (waiting for approval from insurance).    Myra Gianotti, PA-C Surgical Short Stay/Anesthesiology [facility] Phone [phone] [facility] Phone [phone] 12/28/2019 11:59 AM

## 2019-12-28 NOTE — Anesthesia Preprocedure Evaluation (Addendum)
Anesthesia Evaluation  Patient identified by MRN, date of birth, ID band Patient awake    Reviewed: Allergy & Precautions, H&P , NPO status , Patient's Chart, lab work & pertinent test results  Airway Mallampati: II  TM Distance: >3 FB Neck ROM: Full    Dental no notable dental hx. (+) Edentulous Upper, Dental Advisory Given   Pulmonary neg pulmonary ROS,    Pulmonary exam normal breath sounds clear to auscultation       Cardiovascular negative cardio ROS   Rhythm:Regular Rate:Normal     Neuro/Psych Anxiety Depression negative neurological ROS     GI/Hepatic negative GI ROS, (+) Cirrhosis   Esophageal Varices and ascites    ,   Endo/Other  Hypothyroidism   Renal/GU negative Renal ROS  negative genitourinary   Musculoskeletal  (+) Arthritis , Osteoarthritis,    Abdominal   Peds  Hematology  (+) Blood dyscrasia, anemia ,   Anesthesia Other Findings   Reproductive/Obstetrics negative OB ROS                            Anesthesia Physical Anesthesia Plan  ASA: III  Anesthesia Plan: General   Post-op Pain Management:    Induction: Intravenous  PONV Risk Score and Plan: 4 or greater and Ondansetron, Dexamethasone and Midazolam  Airway Management Planned: LMA  Additional Equipment:   Intra-op Plan:   Post-operative Plan: Extubation in OR  Informed Consent: I have reviewed the patients History and Physical, chart, labs and discussed the procedure including the risks, benefits and alternatives for the proposed anesthesia with the patient or authorized representative who has indicated his/her understanding and acceptance.     Dental advisory given  Plan Discussed with: CRNA  Anesthesia Plan Comments: (See PAT note written 12/28/2019 by Myra Gianotti, PA-C. Cirrhosis/pancyotopenia history. PLT count ~ 30-40's. Had labs within the past month. Plan for STAT CBC day of surgery per  discussion with Dr. Linna Caprice. If additional labs desired please let staff know. )       Anesthesia Quick Evaluation

## 2019-12-29 ENCOUNTER — Ambulatory Visit (HOSPITAL_COMMUNITY)
Admission: RE | Admit: 2019-12-29 | Discharge: 2019-12-29 | Disposition: A | Discharge status: Home / Self Care | Payer: Medicare HMO | Attending: Orthopedic Surgery | Admitting: Orthopedic Surgery

## 2019-12-29 ENCOUNTER — Ambulatory Visit (HOSPITAL_COMMUNITY): Payer: Medicare HMO | Admitting: Vascular Surgery

## 2019-12-29 ENCOUNTER — Encounter (HOSPITAL_COMMUNITY)
Admission: RE | Disposition: A | Discharge status: Home / Self Care | Payer: Self-pay | Source: Home / Self Care | Attending: Orthopedic Surgery

## 2019-12-29 ENCOUNTER — Encounter (HOSPITAL_COMMUNITY): Payer: Self-pay | Admitting: Orthopedic Surgery

## 2019-12-29 ENCOUNTER — Other Ambulatory Visit: Payer: Self-pay

## 2019-12-29 DIAGNOSIS — D649 Anemia, unspecified: Secondary | ICD-10-CM | POA: Insufficient documentation

## 2019-12-29 DIAGNOSIS — D61818 Other pancytopenia: Secondary | ICD-10-CM | POA: Insufficient documentation

## 2019-12-29 DIAGNOSIS — D696 Thrombocytopenia, unspecified: Secondary | ICD-10-CM | POA: Diagnosis not present

## 2019-12-29 DIAGNOSIS — Z811 Family history of alcohol abuse and dependence: Secondary | ICD-10-CM | POA: Insufficient documentation

## 2019-12-29 DIAGNOSIS — R011 Cardiac murmur, unspecified: Secondary | ICD-10-CM | POA: Insufficient documentation

## 2019-12-29 DIAGNOSIS — S63255A Unspecified dislocation of left ring finger, initial encounter: Secondary | ICD-10-CM | POA: Diagnosis not present

## 2019-12-29 DIAGNOSIS — E039 Hypothyroidism, unspecified: Secondary | ICD-10-CM | POA: Insufficient documentation

## 2019-12-29 DIAGNOSIS — Z888 Allergy status to other drugs, medicaments and biological substances status: Secondary | ICD-10-CM | POA: Diagnosis not present

## 2019-12-29 DIAGNOSIS — Z885 Allergy status to narcotic agent status: Secondary | ICD-10-CM | POA: Diagnosis not present

## 2019-12-29 DIAGNOSIS — Z882 Allergy status to sulfonamides status: Secondary | ICD-10-CM | POA: Insufficient documentation

## 2019-12-29 DIAGNOSIS — Z886 Allergy status to analgesic agent status: Secondary | ICD-10-CM | POA: Insufficient documentation

## 2019-12-29 DIAGNOSIS — K7460 Unspecified cirrhosis of liver: Secondary | ICD-10-CM | POA: Diagnosis not present

## 2019-12-29 DIAGNOSIS — F329 Major depressive disorder, single episode, unspecified: Secondary | ICD-10-CM | POA: Insufficient documentation

## 2019-12-29 DIAGNOSIS — R188 Other ascites: Secondary | ICD-10-CM | POA: Insufficient documentation

## 2019-12-29 DIAGNOSIS — E876 Hypokalemia: Secondary | ICD-10-CM | POA: Diagnosis not present

## 2019-12-29 DIAGNOSIS — Z79899 Other long term (current) drug therapy: Secondary | ICD-10-CM | POA: Diagnosis not present

## 2019-12-29 DIAGNOSIS — M1990 Unspecified osteoarthritis, unspecified site: Secondary | ICD-10-CM | POA: Diagnosis not present

## 2019-12-29 DIAGNOSIS — F418 Other specified anxiety disorders: Secondary | ICD-10-CM | POA: Diagnosis not present

## 2019-12-29 DIAGNOSIS — I8510 Secondary esophageal varices without bleeding: Secondary | ICD-10-CM | POA: Diagnosis not present

## 2019-12-29 DIAGNOSIS — M67844 Other specified disorders of tendon, left hand: Secondary | ICD-10-CM | POA: Insufficient documentation

## 2019-12-29 DIAGNOSIS — G629 Polyneuropathy, unspecified: Secondary | ICD-10-CM | POA: Insufficient documentation

## 2019-12-29 DIAGNOSIS — F419 Anxiety disorder, unspecified: Secondary | ICD-10-CM | POA: Insufficient documentation

## 2019-12-29 DIAGNOSIS — S63263A Dislocation of metacarpophalangeal joint of left middle finger, initial encounter: Secondary | ICD-10-CM | POA: Diagnosis not present

## 2019-12-29 DIAGNOSIS — Z880 Allergy status to penicillin: Secondary | ICD-10-CM | POA: Insufficient documentation

## 2019-12-29 DIAGNOSIS — S63265A Dislocation of metacarpophalangeal joint of left ring finger, initial encounter: Secondary | ICD-10-CM | POA: Diagnosis not present

## 2019-12-29 DIAGNOSIS — S63253A Unspecified dislocation of left middle finger, initial encounter: Secondary | ICD-10-CM | POA: Diagnosis not present

## 2019-12-29 HISTORY — PX: REPAIR EXTENSOR TENDON: SHX5382

## 2019-12-29 LAB — COMPREHENSIVE METABOLIC PANEL
ALT: 21 U/L (ref 0–44)
AST: 60 U/L — ABNORMAL HIGH (ref 15–41)
Albumin: 3.2 g/dL — ABNORMAL LOW (ref 3.5–5.0)
Alkaline Phosphatase: 116 U/L (ref 38–126)
Anion gap: 12 (ref 5–15)
BUN: 8 mg/dL (ref 8–23)
CO2: 32 mmol/L (ref 22–32)
Calcium: 9 mg/dL (ref 8.9–10.3)
Chloride: 100 mmol/L (ref 98–111)
Creatinine, Ser: 0.96 mg/dL (ref 0.44–1.00)
GFR calc Af Amer: >60 mL/min (ref >60)
GFR calc non Af Amer: >60 mL/min (ref >60)
Glucose, Bld: 112 mg/dL — ABNORMAL HIGH (ref 70–99)
Potassium: 3 mmol/L — ABNORMAL LOW (ref 3.5–5.1)
Sodium: 144 mmol/L (ref 135–145)
Total Bilirubin: 3.5 mg/dL — ABNORMAL HIGH (ref 0.3–1.2)
Total Protein: 6.4 g/dL — ABNORMAL LOW (ref 6.5–8.1)

## 2019-12-29 LAB — CBC
HCT: 37.4 % (ref 36.0–46.0)
Hemoglobin: 11.6 g/dL — ABNORMAL LOW (ref 12.0–15.0)
MCH: 26.2 pg (ref 26.0–34.0)
MCHC: 31 g/dL (ref 30.0–36.0)
MCV: 84.6 fL (ref 80.0–100.0)
Platelets: 30 10*3/uL — ABNORMAL LOW (ref 150–400)
RBC: 4.42 MIL/uL (ref 3.87–5.11)
RDW: 18.2 % — ABNORMAL HIGH (ref 11.5–15.5)
WBC: 3.1 10*3/uL — ABNORMAL LOW (ref 4.0–10.5)
nRBC: 0 % (ref 0.0–0.2)

## 2019-12-29 SURGERY — REPAIR, TENDON, EXTENSOR
Anesthesia: General | Site: Hand | Laterality: Left

## 2019-12-29 MED ORDER — FENTANYL CITRATE (PF) 250 MCG/5ML IJ SOLN
INTRAMUSCULAR | Status: AC
  Filled 2019-12-29: qty 5

## 2019-12-29 MED ORDER — ONDANSETRON HCL 4 MG/2ML IJ SOLN
INTRAMUSCULAR | Status: AC
  Filled 2019-12-29: qty 2

## 2019-12-29 MED ORDER — PHENYLEPHRINE HCL-NACL 10-0.9 MG/250ML-% IV SOLN
INTRAVENOUS | Status: DC | PRN
  Administered 2019-12-29: 20 ug/min via INTRAVENOUS

## 2019-12-29 MED ORDER — HYDROMORPHONE HCL 1 MG/ML IJ SOLN
0.2500 mg | INTRAMUSCULAR | Status: DC | PRN
  Administered 2019-12-29 (×2): 0.25 mg via INTRAVENOUS
  Administered 2019-12-29: 0.5 mg via INTRAVENOUS

## 2019-12-29 MED ORDER — CLINDAMYCIN PHOSPHATE 900 MG/50ML IV SOLN
INTRAVENOUS | Status: AC
  Filled 2019-12-29: qty 50

## 2019-12-29 MED ORDER — BUPIVACAINE HCL (PF) 0.25 % IJ SOLN
INTRAMUSCULAR | Status: AC
  Filled 2019-12-29: qty 10

## 2019-12-29 MED ORDER — MIDAZOLAM HCL 5 MG/5ML IJ SOLN
INTRAMUSCULAR | Status: DC | PRN
  Administered 2019-12-29: 2 mg via INTRAVENOUS

## 2019-12-29 MED ORDER — CLINDAMYCIN PHOSPHATE 900 MG/50ML IV SOLN
900.0000 mg | INTRAVENOUS | Status: AC
  Administered 2019-12-29: 900 mg via INTRAVENOUS

## 2019-12-29 MED ORDER — ONDANSETRON HCL 4 MG/2ML IJ SOLN
INTRAMUSCULAR | Status: DC | PRN
  Administered 2019-12-29: 4 mg via INTRAVENOUS

## 2019-12-29 MED ORDER — PROPOFOL 10 MG/ML IV BOLUS
INTRAVENOUS | Status: DC | PRN
  Administered 2019-12-29: 150 mg via INTRAVENOUS

## 2019-12-29 MED ORDER — 0.9 % SODIUM CHLORIDE (POUR BTL) OPTIME
TOPICAL | Status: DC | PRN
  Administered 2019-12-29: 1000 mL

## 2019-12-29 MED ORDER — CHLORHEXIDINE GLUCONATE 4 % EX LIQD: 60.0000 mL | Freq: Once | CUTANEOUS | Status: DC

## 2019-12-29 MED ORDER — LACTATED RINGERS IV SOLN: INTRAVENOUS | Status: DC

## 2019-12-29 MED ORDER — MIDAZOLAM HCL 2 MG/2ML IJ SOLN
INTRAMUSCULAR | Status: AC
  Filled 2019-12-29: qty 2

## 2019-12-29 MED ORDER — BUPIVACAINE HCL 0.25 % IJ SOLN
INTRAMUSCULAR | Status: DC | PRN
  Administered 2019-12-29: 5 mL

## 2019-12-29 MED ORDER — FENTANYL CITRATE (PF) 100 MCG/2ML IJ SOLN
INTRAMUSCULAR | Status: AC
  Filled 2019-12-29: qty 2

## 2019-12-29 MED ORDER — LIDOCAINE 2% (20 MG/ML) 5 ML SYRINGE
INTRAMUSCULAR | Status: DC | PRN
  Administered 2019-12-29: 60 mg via INTRAVENOUS

## 2019-12-29 MED ORDER — HYDROMORPHONE HCL 1 MG/ML IJ SOLN
INTRAMUSCULAR | Status: AC
  Filled 2019-12-29: qty 1

## 2019-12-29 MED ORDER — DEXAMETHASONE SODIUM PHOSPHATE 10 MG/ML IJ SOLN
INTRAMUSCULAR | Status: AC
  Filled 2019-12-29: qty 1

## 2019-12-29 MED ORDER — FENTANYL CITRATE (PF) 100 MCG/2ML IJ SOLN
INTRAMUSCULAR | Status: DC | PRN
  Administered 2019-12-29 (×2): 50 ug via INTRAVENOUS

## 2019-12-29 MED ORDER — DEXAMETHASONE SODIUM PHOSPHATE 4 MG/ML IJ SOLN
INTRAMUSCULAR | Status: DC | PRN
  Administered 2019-12-29: 4 mg via INTRAVENOUS

## 2019-12-29 SURGICAL SUPPLY — 50 items
BNDG CMPR 9X4 STRL LF SNTH (GAUZE/BANDAGES/DRESSINGS) ×1
BNDG ELASTIC 3X5.8 VLCR STR LF (GAUZE/BANDAGES/DRESSINGS) ×2 IMPLANT
BNDG ELASTIC 4X5.8 VLCR STR LF (GAUZE/BANDAGES/DRESSINGS) ×2 IMPLANT
BNDG ESMARK 4X9 LF (GAUZE/BANDAGES/DRESSINGS) ×2 IMPLANT
BNDG GAUZE ELAST 4 BULKY (GAUZE/BANDAGES/DRESSINGS) ×2 IMPLANT
CORD BIPOLAR FORCEPS 12FT (ELECTRODE) ×2 IMPLANT
COVER SURGICAL LIGHT HANDLE (MISCELLANEOUS) ×2 IMPLANT
COVER WAND RF STERILE (DRAPES) ×2 IMPLANT
CUFF TOURN SGL QUICK 18X4 (TOURNIQUET CUFF) IMPLANT
CUFF TOURN SGL QUICK 24 (TOURNIQUET CUFF)
CUFF TRNQT CYL 24X4X16.5-23 (TOURNIQUET CUFF) IMPLANT
DECANTER SPIKE VIAL GLASS SM (MISCELLANEOUS) IMPLANT
DRAPE OEC MINIVIEW 54X84 (DRAPES) IMPLANT
DRAPE SURG 17X23 STRL (DRAPES) ×2 IMPLANT
DURAPREP 26ML APPLICATOR (WOUND CARE) ×2 IMPLANT
GAUZE SPONGE 4X4 12PLY STRL (GAUZE/BANDAGES/DRESSINGS) ×2 IMPLANT
GAUZE XEROFORM 1X8 LF (GAUZE/BANDAGES/DRESSINGS) ×2 IMPLANT
GLOVE SURG SYN 8.0 (GLOVE) ×2 IMPLANT
GOWN STRL REUS W/ TWL LRG LVL3 (GOWN DISPOSABLE) ×1 IMPLANT
GOWN STRL REUS W/ TWL XL LVL3 (GOWN DISPOSABLE) ×1 IMPLANT
GOWN STRL REUS W/TWL LRG LVL3 (GOWN DISPOSABLE) ×2
GOWN STRL REUS W/TWL XL LVL3 (GOWN DISPOSABLE) ×2
KIT BASIN OR (CUSTOM PROCEDURE TRAY) ×2 IMPLANT
KIT TURNOVER KIT B (KITS) ×2 IMPLANT
MANIFOLD NEPTUNE II (INSTRUMENTS) ×2 IMPLANT
NEEDLE HYPO 25GX1X1/2 BEV (NEEDLE) IMPLANT
NS IRRIG 1000ML POUR BTL (IV SOLUTION) ×2 IMPLANT
PACK ORTHO EXTREMITY (CUSTOM PROCEDURE TRAY) ×2 IMPLANT
PAD ARMBOARD 7.5X6 YLW CONV (MISCELLANEOUS) ×4 IMPLANT
PAD CAST 3X4 CTTN HI CHSV (CAST SUPPLIES) ×1 IMPLANT
PAD CAST 4YDX4 CTTN HI CHSV (CAST SUPPLIES) IMPLANT
PADDING CAST COTTON 3X4 STRL (CAST SUPPLIES) ×2
PADDING CAST COTTON 4X4 STRL (CAST SUPPLIES)
STRIP CLOSURE SKIN 1/2X4 (GAUZE/BANDAGES/DRESSINGS) IMPLANT
SUT ETHIBOND 3-0 V-5 (SUTURE) IMPLANT
SUT ETHILON 4 0 PS 2 18 (SUTURE) ×2 IMPLANT
SUT ETHILON 5 0 PS 2 18 (SUTURE) IMPLANT
SUT FIBERWIRE 4-0 18 TAPR NDL (SUTURE) ×4
SUT PROLENE 3 0 PS 2 (SUTURE) IMPLANT
SUT SILK 4 0 PS 2 (SUTURE) IMPLANT
SUT VIC AB 3-0 FS2 27 (SUTURE) IMPLANT
SUT VIC AB 4-0 P-3 18X BRD (SUTURE) IMPLANT
SUT VIC AB 4-0 P3 18 (SUTURE)
SUTURE FIBERWR 4-0 18 TAPR NDL (SUTURE) ×2 IMPLANT
SYR CONTROL 10ML LL (SYRINGE) IMPLANT
TOWEL GREEN STERILE (TOWEL DISPOSABLE) ×2 IMPLANT
TOWEL GREEN STERILE FF (TOWEL DISPOSABLE) ×2 IMPLANT
TUBE CONNECTING 12X1/4 (SUCTIONS) IMPLANT
UNDERPAD 30X30 (UNDERPADS AND DIAPERS) ×2 IMPLANT
WATER STERILE IRR 1000ML POUR (IV SOLUTION) ×2 IMPLANT

## 2019-12-29 NOTE — Anesthesia Postprocedure Evaluation (Signed)
Anesthesia Post Note  Patient: Madline Oesterling  Procedure(s) Performed: LEFT LONG AND LEFT RING FRINGER EXTENSOR REALIGNAMENT WITH POSSIBLE TENDON TRANSFER (Left Hand)     Patient location during evaluation: PACU Anesthesia Type: General Level of consciousness: awake and alert Pain management: pain level controlled Vital Signs Assessment: post-procedure vital signs reviewed and stable Respiratory status: spontaneous breathing, nonlabored ventilation, respiratory function stable and patient connected to nasal cannula oxygen Cardiovascular status: blood pressure returned to baseline and stable Postop Assessment: no apparent nausea or vomiting Anesthetic complications: no    Last Vitals:  Vitals:   12/29/19 1207 12/29/19 1221  BP: 120/72 135/72  Pulse: 89 85  Resp: 16 18  Temp:    SpO2: 96% 96%    Last Pain:  Vitals:   12/29/19 1115  TempSrc:   PainSc: 0-No pain                 Jeno Calleros,W. EDMOND

## 2019-12-29 NOTE — Transfer of Care (Signed)
Immediate Anesthesia Transfer of Care Note  Patient: Megan Roberson  Procedure(s) Performed: LEFT LONG AND LEFT RING FRINGER EXTENSOR REALIGNAMENT WITH POSSIBLE TENDON TRANSFER (Left Hand)  Patient Location: PACU  Anesthesia Type:General  Level of Consciousness: awake, alert  and oriented  Airway & Oxygen Therapy: Patient Spontanous Breathing and Patient connected to nasal cannula oxygen  Post-op Assessment: Report given to RN, Post -op Vital signs reviewed and stable and Patient moving all extremities  Post vital signs: Reviewed and stable  Last Vitals:  Vitals Value Taken Time  BP 149/68 12/29/19 1021  Temp    Pulse 80 12/29/19 1021  Resp 16 12/29/19 1021  SpO2 92 % 12/29/19 1021  Vitals shown include unvalidated device data.  Last Pain:  Vitals:   12/29/19 1019  TempSrc:   PainSc: (P) Asleep         Complications: No apparent anesthesia complications

## 2019-12-29 NOTE — Anesthesia Procedure Notes (Signed)
Procedure Name: LMA Insertion Date/Time: 12/29/2019 9:21 AM Performed by: Amadeo Garnet, CRNA Pre-anesthesia Checklist: Emergency Drugs available, Suction available, Patient identified and Patient being monitored Patient Re-evaluated:Patient Re-evaluated prior to induction Oxygen Delivery Method: Circle system utilized Preoxygenation: Pre-oxygenation with 100% oxygen Induction Type: IV induction Ventilation: Mask ventilation without difficulty LMA: LMA inserted LMA Size: 4.0 Number of attempts: 1 Placement Confirmation: ETT inserted through vocal cords under direct vision,  positive ETCO2 and breath sounds checked- equal and bilateral Tube secured with: Tape Dental Injury: Teeth and Oropharynx as per pre-operative assessment

## 2019-12-29 NOTE — Brief Op Note (Signed)
12/29/2019  10:13 AM  PATIENT:  Megan Roberson  64 y.o. female  PRE-OPERATIVE DIAGNOSIS:  LEFT LONG AND LEFT RING FINGER, EXTENSOR TENDON INSTABILITY  POST-OPERATIVE DIAGNOSIS:  LEFT LONG AND LEFT RING FINGER, EXTENSOR TENDON INSTABILITY  PROCEDURE:  Procedure(s): LEFT LONG AND LEFT RING FRINGER EXTENSOR REALIGNAMENT WITH POSSIBLE TENDON TRANSFER (Left)  SURGEON:  Surgeon(s) and Role:    Charlotte Crumb, MD - Primary  PHYSICIAN ASSISTANT:   ASSISTANTS: none   ANESTHESIA:   general  EBL: Minimal  BLOOD ADMINISTERED:none  DRAINS: none   LOCAL MEDICATIONS USED:  MARCAINE     SPECIMEN:  No Specimen  DISPOSITION OF SPECIMEN:  N/A  COUNTS:  correct  TOURNIQUET:   Total Tourniquet Time Documented: Upper Arm (Left) - -030092 minutes Total: Upper Arm (Left) - -330076 minutes   DICTATION: .Viviann Spare Dictation  PLAN OF CARE: Discharge to home after PACU  PATIENT DISPOSITION:  PACU - hemodynamically stable.   Delay start of Pharmacological VTE agent (>24hrs) due to surgical blood loss or risk of bleeding: not applicable

## 2019-12-29 NOTE — Progress Notes (Signed)
Updated spouse on plan of care to watch pt for 1 hour.

## 2019-12-29 NOTE — H&P (Signed)
Megan Roberson is an 64 y.o. female.   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 Anxiety    pt. denies  . Arthritis   . Cirrhosis of liver ([place])   . Complication of anesthesia    ileus after surgery 2018  . Depression    "not all the time"  . Heart murmur    Echo 10/06/17 [facility]): Normal LV/RV size, basal septal hypertophy-normal variant, LVEF 65-70%, normal LV/RV function, est RAP 5 mmHg, no sign valvular stenosis or regurg--trace MR/TR/PR  . History of abdominal paracentesis   . History of blood transfusion   . Hypothyroidism   . Ileus ([place])   . Liver disease 2008  . Neuropathy   . Osteoporosis 02/09/2018  . Pancytopenia ([place])   . Pneumonia    2019ish  . Thrombocytopenia ([place]) 12/01/2019  . Vaginal Pap smear, abnormal     Past Surgical History:  Procedure Laterality Date  . AUGMENTATION MAMMAPLASTY Bilateral    20 years ago  . BIOPSY  03/15/2018   Procedure: BIOPSY;  Surgeon: Yetta Flock, MD;  Location: [facility];  Service: Gastroenterology;;  Gastric  . COLONOSCOPY WITH ESOPHAGOGASTRODUODENOSCOPY (EGD) AND ESOPHAGEAL DILATION (ED)    . COLONOSCOPY WITH PROPOFOL N/A 03/15/2018   Procedure: COLONOSCOPY WITH PROPOFOL;  Surgeon: Yetta Flock, MD;  Location: [facility];  Service: Gastroenterology;  Laterality: N/A;  . ESOPHAGOGASTRODUODENOSCOPY (EGD) WITH PROPOFOL N/A 03/15/2018   Procedure: ESOPHAGOGASTRODUODENOSCOPY (EGD) WITH PROPOFOL;  Surgeon: Yetta Flock, MD;  Location: [facility];  Service: Gastroenterology;  Laterality: N/A;  . ESOPHAGOGASTRODUODENOSCOPY W/ BANDING     Varices  . FRACTURE SURGERY Left    x 2 L elbow and hip left   . HARDWARE REMOVAL Left 09/17/2017   Procedure:  HARDWARE REMOVAL LEFT OLECRANON;  Surgeon: Altamese [place], MD;  Location: [place];  Service: Orthopedics;  Laterality: Left;  . HIP SURGERY Left    from fracture- rod in palce  . IR PARACENTESIS  11/18/2017  . IR PARACENTESIS  11/26/2017  . IR PARACENTESIS  03/14/2018  . KNEE SURGERY Right    open incision for ligaments  . ORIF ELBOW FRACTURE Left 04/30/2017   Procedure: REVISION OPEN REDUCTION INTERNAL FIXATION (ORIF) ELBOW/OLECRANON FRACTURE;  Surgeon: Altamese Dierks, MD;  Location: [place];  Service: Orthopedics;  Laterality: Left;    Family History  Adopted: Yes  Problem Relation Age of Onset  . Alcohol abuse Daughter    Social History:  reports that she has never smoked. She has never used smokeless tobacco. She reports that she does not drink alcohol or use drugs.  Allergies:  Allergies  Allergen Reactions  . Penicillins Swelling and Other (See Comments)    FACIAL SWELLING Did it involve swelling of the face/tongue/throat, SOB, or low BP? Yes Did it involve sudden or severe rash/hives, skin peeling, or any reaction on the inside of your mouth or nose? No Did you need to seek medical attention at a hospital or doctor's office? Unknown When did it last happen?30 years If all above answers are "NO", may proceed with cephalosporin use.   . Tylenol [Acetaminophen] Nausea Only  . Sulfa Antibiotics Itching and Rash  . Tramadol Swelling and Rash    Medications Prior to Admission  Medication  Sig Dispense Refill  . acetaminophen (TYLENOL) 500 MG tablet Take 1,000 mg by mouth every 8 (eight) hours as needed for moderate pain or headache.    . furosemide (LASIX) 80 MG tablet Take 80 mg by mouth daily.    Marland Kitchen lactulose (CHRONULAC) 10 GM/15ML solution Take 30 mLs (20 g total) by mouth 2 (two) times daily. (Patient taking differently: Take 20 g by mouth 3 (three) times daily. ) 946 mL 0  . levOCARNitine (CARNITOR) 330 MG tablet Take 330 mg by mouth 3 (three) times daily.    Marland Kitchen  levothyroxine (SYNTHROID, LEVOTHROID) 75 MCG tablet Take 1 tablet (75 mcg total) by mouth daily before breakfast. 30 tablet 0  . oxyCODONE (OXY IR/ROXICODONE) 5 MG immediate release tablet Take 5 mg by mouth daily as needed for pain.    Marland Kitchen sertraline (ZOLOFT) 50 MG tablet Take 50 mg by mouth daily.    Marland Kitchen spironolactone (ALDACTONE) 100 MG tablet Take 2 tablets (200 mg total) by mouth daily. (Patient taking differently: Take 100 mg by mouth 2 (two) times daily. ) 30 tablet 0  . traZODone (DESYREL) 50 MG tablet Take 50 mg by mouth at bedtime as needed for sleep.     Marland Kitchen denosumab (PROLIA) 60 MG/ML SOSY injection Inject 60 mg into the skin every 6 (six) months.    . furosemide (LASIX) 40 MG tablet Take 1 tablet (40 mg total) by mouth 2 (two) times daily. (Patient not taking: Reported on 12/26/2019) 120 tablet 3  . gabapentin (NEURONTIN) 100 MG capsule Take 2 capsules (200 mg total) by mouth 3 (three) times daily. (Patient not taking: Reported on 12/26/2019) 90 capsule 0  . ondansetron (ZOFRAN) 4 MG tablet Take 4 mg by mouth every 8 (eight) hours as needed for nausea or vomiting.    . rifaximin (XIFAXAN) 550 MG TABS tablet Take 1 tablet (550 mg total) by mouth 2 (two) times daily. 60 tablet 0    Results for orders placed or performed during the hospital encounter of 12/27/19 (from the past 48 hour(s))  SARS CORONAVIRUS 2 (TAT 6-24 HRS) Nasopharyngeal Nasopharyngeal Swab     Status: None   Collection Time: 12/27/19  3:33 PM   Specimen: Nasopharyngeal Swab  Result Value Ref Range   SARS Coronavirus 2 NEGATIVE NEGATIVE    Comment: (NOTE) SARS-CoV-2 target nucleic acids are NOT DETECTED. The SARS-CoV-2 RNA is generally detectable in upper and lower respiratory specimens during the acute phase of infection. Negative results do not preclude SARS-CoV-2 infection, do not rule out co-infections with other pathogens, and should not be used as the sole basis for treatment or other patient management  decisions. Negative results must be combined with clinical observations, patient history, and epidemiological information. The expected result is Negative. Fact Sheet for Patients: SugarRoll.be Fact Sheet for Healthcare Providers: https://www.woods-mathews.com/ This test is not yet approved or cleared by the [place] FDA and  has been authorized for detection and/or diagnosis of SARS-CoV-2 by FDA under an Emergency Use Authorization (EUA). This EUA will remain  in effect (meaning this test can be used) for the duration of the COVID-19 declaration under Section 56 4(b)(1) of the Act, 21 U.S.C. section 360bbb-3(b)(1), unless the authorization is terminated or revoked sooner. Performed at [facility], [place] [address], [place], [place] [address]    No results found.  Review of Systems  All other systems reviewed and are negative.   Blood pressure (!) 138/54, pulse 70, temperature 98.4 F (36.9 C), temperature  source Oral, resp. rate 18, height 5' 7"  (1.702 m), weight 65.8 kg, SpO2 98 %. Physical Exam  Constitutional: She is oriented to person, place, and time. She appears well-developed and well-nourished.  HENT:  Head: Normocephalic and atraumatic.  Cardiovascular: Normal rate.  Respiratory: Effort normal.  Musculoskeletal:     Left hand: Deformity and tenderness present. Decreased range of motion.     Cervical back: Normal range of motion.     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  Skin: Skin is warm.  Psychiatric: She has a normal mood and affect. Her behavior is normal. Judgment and thought content normal.     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  Have discussed the role of exploration and repair versus  reconstruction of the extensor mechanism and possible pinning of the MP joints as necessary.  Patient understands risks and benefits and wishes to proceed  Schuyler Amor, MD 12/29/2019, 8:47 AM

## 2019-12-29 NOTE — Op Note (Signed)
Patient was taken to the operating suite and after induction of adequate general anesthetic the left upper extremity was prepped and draped in the usual sterile fashion.  An Esmarch was used to exsanguinate the limb and the tourniquet was inflated to 250 mmHg.  At this point time a longitudinal incision was made between the long and ring metacarpal starting at the metacarpophalangeal joint joints and going proximally to the bases of the metacarpals.  Visualization revealed dislocated extensor mechanisms of the long and ring finger into the ulnar side.  On the long finger we carefully released the ulnar sagittal band to allow the extensor mechanism to be routed dorsally over the metacarpophalangeal joint.  We harvested a ulnarly based slip of the extensor mechanism and transected it proximally and do it into the distal wound.  We passed it under the extensor tendon and sutured with 4-0 FiberWire we then passed it from dorsal to volar out and under the intermetacarpal ligament between the index and long finger and then in a Pulvertaft weave sutured it back to the long extensor to realign the extensor tendon.  The same procedure was performed with the ring finger using the same technique and suture.  The wound was then thoroughly irrigated.  It was loosely closed with 4-0 nylon.  Xeroform, 4 x 4's, and a palmar splint was applied.  The patient tolerated this procedure well and went to come in stable fashion.

## 2019-12-29 NOTE — Progress Notes (Signed)
Spoke with Dr Therisa Doyne /MDA r e prolonged drowsiness and inability to hold RA sats >90. Pulls 724ms on IS, lungs cta. Within moents after interactions with pt, she falls immediately back to sleep. Plan" observe another hour and reevaluate

## 2020-01-02 DIAGNOSIS — M79642 Pain in left hand: Secondary | ICD-10-CM | POA: Diagnosis not present

## 2020-01-02 DIAGNOSIS — S63659A Sprain of metacarpophalangeal joint of unspecified finger, initial encounter: Secondary | ICD-10-CM | POA: Diagnosis not present

## 2020-01-02 DIAGNOSIS — M25642 Stiffness of left hand, not elsewhere classified: Secondary | ICD-10-CM | POA: Diagnosis not present

## 2020-01-02 DIAGNOSIS — M79645 Pain in left finger(s): Secondary | ICD-10-CM | POA: Diagnosis not present

## 2020-01-09 ENCOUNTER — Ambulatory Visit (HOSPITAL_COMMUNITY)
Admission: RE | Admit: 2020-01-09 | Discharge: 2020-01-09 | Disposition: A | Discharge status: Home / Self Care | Payer: Medicare HMO | Source: Ambulatory Visit | Attending: Gastroenterology | Admitting: Gastroenterology

## 2020-01-09 ENCOUNTER — Telehealth: Payer: Self-pay

## 2020-01-09 ENCOUNTER — Other Ambulatory Visit: Payer: Self-pay

## 2020-01-09 DIAGNOSIS — K7460 Unspecified cirrhosis of liver: Secondary | ICD-10-CM | POA: Diagnosis not present

## 2020-01-09 DIAGNOSIS — R188 Other ascites: Secondary | ICD-10-CM | POA: Diagnosis not present

## 2020-01-09 DIAGNOSIS — K7290 Hepatic failure, unspecified without coma: Secondary | ICD-10-CM | POA: Insufficient documentation

## 2020-01-09 DIAGNOSIS — K7682 Hepatic encephalopathy: Secondary | ICD-10-CM

## 2020-01-09 DIAGNOSIS — K8020 Calculus of gallbladder without cholecystitis without obstruction: Secondary | ICD-10-CM | POA: Diagnosis not present

## 2020-01-09 NOTE — Telephone Encounter (Signed)
Received call from [facility] Pathology with STAT result from abdominal U/S done today, 01-09-2020.  Report printed and given to Dr. Havery Moros.

## 2020-01-10 NOTE — Telephone Encounter (Signed)
Called radiology and discussed results. Recommendations in result note from the [place]

## 2020-01-11 ENCOUNTER — Other Ambulatory Visit: Payer: Self-pay

## 2020-01-11 ENCOUNTER — Telehealth: Payer: Self-pay

## 2020-01-11 DIAGNOSIS — R935 Abnormal findings on diagnostic imaging of other abdominal regions, including retroperitoneum: Secondary | ICD-10-CM

## 2020-01-11 NOTE — Telephone Encounter (Signed)
Left message to please call back

## 2020-01-15 ENCOUNTER — Ambulatory Visit: Payer: Medicare HMO | Admitting: Neurology

## 2020-01-15 NOTE — Progress Notes (Deleted)
NEUROLOGY CONSULTATION NOTE  Megan Roberson MRN: [id number] DOB: 1956/10/09  Referring provider: [place] Cellar, MD Primary care provider: Edwinna Areola. 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  History supplemented by referring provider notes.  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  Hepatic encephalopathy is controlled with lactulose.  She has been compliant with her medications.  She has not been drinking alcohol for ***.  For ***, she has had frequent falls ***.   She had lumbar spine MRI on 10/31/2017 to evaluate sacral fracture following a fall.  Acute complex sacral fracture involving S1 and S2 with impaction and posterior displacement of S2 segment noted with possible mass effecton descending cauda equina at S1 from narrow canal and acute edema.  Mild lumbar spondylosis was noted with no significant foraminal or canal stenosis.  PAST MEDICAL HISTORY: Past Medical History:  Diagnosis Date  . Anxiety    pt. denies  . Arthritis   . Cirrhosis of liver ([place])   . Complication of anesthesia    ileus after surgery 2018  . Depression    "not all the time"  . Heart murmur    Echo 10/06/17 [facility]): Normal LV/RV size, basal septal hypertophy-normal variant, LVEF 65-70%, normal LV/RV function, est RAP 5 mmHg, no sign valvular stenosis or regurg--trace MR/TR/PR  . History of abdominal paracentesis   . History of blood transfusion   . Hypothyroidism   . Ileus ([place])   . Liver disease 2008  . Neuropathy   . Osteoporosis 02/09/2018  . Pancytopenia ([place])   . Pneumonia    2019ish  . Thrombocytopenia ([place]) 12/01/2019  . Vaginal Pap smear, abnormal     PAST SURGICAL HISTORY: Past Surgical History:  Procedure Laterality Date  . AUGMENTATION MAMMAPLASTY Bilateral    20 years ago  . BIOPSY  03/15/2018   Procedure: BIOPSY;   Surgeon: Yetta Flock, MD;  Location: [facility];  Service: Gastroenterology;;  Gastric  . COLONOSCOPY WITH ESOPHAGOGASTRODUODENOSCOPY (EGD) AND ESOPHAGEAL DILATION (ED)    . COLONOSCOPY WITH PROPOFOL N/A 03/15/2018   Procedure: COLONOSCOPY WITH PROPOFOL;  Surgeon: Yetta Flock, MD;  Location: [facility];  Service: Gastroenterology;  Laterality: N/A;  . ESOPHAGOGASTRODUODENOSCOPY (EGD) WITH PROPOFOL N/A 03/15/2018   Procedure: ESOPHAGOGASTRODUODENOSCOPY (EGD) WITH PROPOFOL;  Surgeon: Yetta Flock, MD;  Location: [facility];  Service: Gastroenterology;  Laterality: N/A;  . ESOPHAGOGASTRODUODENOSCOPY W/ BANDING     Varices  . FRACTURE SURGERY Left    x 2 L elbow and hip left   . HARDWARE REMOVAL Left 09/17/2017   Procedure: HARDWARE REMOVAL LEFT OLECRANON;  Surgeon: Altamese Taylor Creek, MD;  Location: [place];  Service: Orthopedics;  Laterality: Left;  . HIP SURGERY Left    from fracture- rod in palce  . IR PARACENTESIS  11/18/2017  . IR PARACENTESIS  11/26/2017  . IR PARACENTESIS  03/14/2018  . KNEE SURGERY Right    open incision for ligaments  . ORIF ELBOW FRACTURE Left 04/30/2017   Procedure: REVISION OPEN REDUCTION INTERNAL FIXATION (ORIF) ELBOW/OLECRANON FRACTURE;  Surgeon: Altamese , MD;  Location: [place];  Service: Orthopedics;  Laterality: Left;  . REPAIR EXTENSOR TENDON Left 12/29/2019   Procedure: LEFT LONG AND LEFT RING FRINGER EXTENSOR REALIGNAMENT WITH POSSIBLE TENDON TRANSFER;  Surgeon: Charlotte Crumb, MD;  Location: [place];  Service: Orthopedics;  Laterality: Left;    MEDICATIONS: Current Outpatient Medications on File Prior to Visit  Medication Sig Dispense Refill  . denosumab (PROLIA) 60 MG/ML SOSY injection Inject 60 mg into the skin every 6 (six) months.    . furosemide (LASIX) 80 MG tablet Take 80 mg by mouth daily.    Marland Kitchen lactulose (CHRONULAC) 10 GM/15ML solution Take 30 mLs (20 g total) by mouth 2 (two) times daily. (Patient taking differently:  Take 20 g by mouth 3 (three) times daily. ) 946 mL 0  . levOCARNitine (CARNITOR) 330 MG tablet Take 330 mg by mouth 3 (three) times daily.    Marland Kitchen levothyroxine (SYNTHROID, LEVOTHROID) 75 MCG tablet Take 1 tablet (75 mcg total) by mouth daily before breakfast. 30 tablet 0  . ondansetron (ZOFRAN) 4 MG tablet Take 4 mg by mouth every 8 (eight) hours as needed for nausea or vomiting.    Marland Kitchen oxyCODONE (OXY IR/ROXICODONE) 5 MG immediate release tablet Take 5 mg by mouth daily as needed for pain.    . rifaximin (XIFAXAN) 550 MG TABS tablet Take 1 tablet (550 mg total) by mouth 2 (two) times daily. 60 tablet 0  . sertraline (ZOLOFT) 50 MG tablet Take 50 mg by mouth daily.    Marland Kitchen spironolactone (ALDACTONE) 100 MG tablet Take 2 tablets (200 mg total) by mouth daily. (Patient taking differently: Take 100 mg by mouth 2 (two) times daily. ) 30 tablet 0  . traZODone (DESYREL) 50 MG tablet Take 50 mg by mouth at bedtime as needed for sleep.      No current facility-administered medications on file prior to visit.    ALLERGIES: Allergies  Allergen Reactions  . Penicillins Swelling and Other (See Comments)    FACIAL SWELLING Did it involve swelling of the face/tongue/throat, SOB, or low BP? Yes Did it involve sudden or severe rash/hives, skin peeling, or any reaction on the inside of your mouth or nose? No Did you need to seek medical attention at a hospital or doctor's office? Unknown When did it last happen?30 years If all above answers are "NO", may proceed with cephalosporin use.   . Tylenol [Acetaminophen] Nausea Only  . Sulfa Antibiotics Itching and Rash  . Tramadol Swelling and Rash    FAMILY HISTORY: Family History  Adopted: Yes  Problem Relation Age of Onset  . Alcohol abuse Daughter    ***.  SOCIAL HISTORY: Social History   Socioeconomic History  . Marital status: Married    Spouse name: Not on file  . Number of children: 2  . Years of education: Not on file  . Highest education  level: Not on file  Occupational History  . Occupation: disabled  Tobacco Use  . Smoking status: Never Smoker  . Smokeless tobacco: Never Used  Substance and Sexual Activity  . Alcohol use: No  . Drug use: No  . Sexual activity: Not Currently    Birth control/protection: Post-menopausal  Other Topics Concern  . Not on file  Social History Narrative  . Not on file   Social Determinants of Health   Financial Resource Strain:   . Difficulty of Paying Living Expenses:   Food Insecurity:   . Worried About Charity fundraiser in the Last Year:   . Arboriculturist in the Last Year:   Transportation Needs:   . Film/video editor (Medical):   Marland Kitchen Lack of Transportation (Non-Medical):   Physical Activity:   . Days of Exercise per Week:   .  Minutes of Exercise per Session:   Stress:   . Feeling of Stress :   Social Connections:   . Frequency of Communication with Friends and Family:   . Frequency of Social Gatherings with Friends and Family:   . Attends Religious Services:   . Active Member of Clubs or Organizations:   . Attends Archivist Meetings:   Marland Kitchen Marital Status:   Intimate Partner Violence:   . Fear of Current or Ex-Partner:   . Emotionally Abused:   Marland Kitchen Physically Abused:   . Sexually Abused:     REVIEW OF SYSTEMS: Constitutional: No fevers, chills, or sweats, no generalized fatigue, change in appetite Eyes: No visual changes, double vision, eye pain Ear, nose and throat: No hearing loss, ear pain, nasal congestion, sore throat Cardiovascular: No chest pain, palpitations Respiratory:  No shortness of breath at rest or with exertion, wheezes GastrointestinaI: No nausea, vomiting, diarrhea, abdominal pain, fecal incontinence Genitourinary:  No dysuria, urinary retention or frequency Musculoskeletal:  No neck pain, back pain Integumentary: No rash, pruritus, skin lesions Neurological: as above Psychiatric: No depression, insomnia, anxiety Endocrine: No  palpitations, fatigue, diaphoresis, mood swings, change in appetite, change in weight, increased thirst Hematologic/Lymphatic:  No purpura, petechiae. Allergic/Immunologic: no itchy/runny eyes, nasal congestion, recent allergic reactions, rashes  PHYSICAL EXAM: *** General: No acute distress.  Patient appears ***-groomed.  *** Head:  Normocephalic/atraumatic Eyes:  fundi examined but not visualized Neck: supple, no paraspinal tenderness, full range of motion Back: No paraspinal tenderness Heart: regular rate and rhythm Lungs: Clear to auscultation bilaterally. Vascular: No carotid bruits. Neurological Exam: Mental status: alert and oriented to person, place, and time, recent and remote memory intact, fund of knowledge intact, attention and concentration intact, speech fluent and not dysarthric, language intact. Cranial nerves: CN I: not tested CN II: pupils equal, round and reactive to light, visual fields intact CN III, IV, VI:  full range of motion, no nystagmus, no ptosis CN V: facial sensation intact CN VII: upper and lower face symmetric CN VIII: hearing intact CN IX, X: gag intact, uvula midline CN XI: sternocleidomastoid and trapezius muscles intact CN XII: tongue midline Bulk & Tone: normal, no fasciculations. Motor:  5/5 throughout *** Sensation:  Pinprick *** temperature *** and vibration sensation intact.  ***. Deep Tendon Reflexes:  2+ throughout, *** toes downgoing.  *** Finger to nose testing:  Without dysmetria.  *** Heel to shin:  Without dysmetria.  *** Gait:  Normal station and stride.  Able to turn and tandem walk. Romberg ***.  IMPRESSION: ***  PLAN: ***  Thank you for allowing me to take part in the care of this patient.  Metta Clines, DO  CC: ***

## 2020-01-16 DIAGNOSIS — R188 Other ascites: Secondary | ICD-10-CM | POA: Diagnosis not present

## 2020-01-16 DIAGNOSIS — K7030 Alcoholic cirrhosis of liver without ascites: Secondary | ICD-10-CM | POA: Diagnosis not present

## 2020-01-16 DIAGNOSIS — I8500 Esophageal varices without bleeding: Secondary | ICD-10-CM | POA: Diagnosis not present

## 2020-01-16 DIAGNOSIS — K7290 Hepatic failure, unspecified without coma: Secondary | ICD-10-CM | POA: Diagnosis not present

## 2020-01-18 ENCOUNTER — Telehealth: Payer: Self-pay

## 2020-01-18 NOTE — Telephone Encounter (Signed)
See result note on 01/11/20

## 2020-01-24 DIAGNOSIS — M25561 Pain in right knee: Secondary | ICD-10-CM | POA: Diagnosis not present

## 2020-01-24 DIAGNOSIS — M25552 Pain in left hip: Secondary | ICD-10-CM | POA: Diagnosis not present

## 2020-01-24 DIAGNOSIS — G8929 Other chronic pain: Secondary | ICD-10-CM | POA: Diagnosis not present

## 2020-01-24 DIAGNOSIS — M545 Low back pain: Secondary | ICD-10-CM | POA: Diagnosis not present

## 2020-01-24 DIAGNOSIS — Z79899 Other long term (current) drug therapy: Secondary | ICD-10-CM | POA: Diagnosis not present

## 2020-02-15 DIAGNOSIS — E039 Hypothyroidism, unspecified: Secondary | ICD-10-CM | POA: Diagnosis not present

## 2020-02-15 DIAGNOSIS — R110 Nausea: Secondary | ICD-10-CM | POA: Diagnosis not present

## 2020-02-15 DIAGNOSIS — M545 Low back pain: Secondary | ICD-10-CM | POA: Diagnosis not present

## 2020-02-15 DIAGNOSIS — G8929 Other chronic pain: Secondary | ICD-10-CM | POA: Diagnosis not present

## 2020-02-15 DIAGNOSIS — G4700 Insomnia, unspecified: Secondary | ICD-10-CM | POA: Diagnosis not present

## 2020-02-17 DIAGNOSIS — M545 Low back pain: Secondary | ICD-10-CM | POA: Diagnosis not present

## 2020-02-17 DIAGNOSIS — M25552 Pain in left hip: Secondary | ICD-10-CM | POA: Diagnosis not present

## 2020-02-17 DIAGNOSIS — M25561 Pain in right knee: Secondary | ICD-10-CM | POA: Diagnosis not present

## 2020-02-17 DIAGNOSIS — Z79899 Other long term (current) drug therapy: Secondary | ICD-10-CM | POA: Diagnosis not present

## 2020-02-17 DIAGNOSIS — G8929 Other chronic pain: Secondary | ICD-10-CM | POA: Diagnosis not present

## 2020-02-28 ENCOUNTER — Telehealth: Payer: Self-pay | Admitting: Gastroenterology

## 2020-02-29 ENCOUNTER — Other Ambulatory Visit: Payer: Self-pay

## 2020-02-29 DIAGNOSIS — K7460 Unspecified cirrhosis of liver: Secondary | ICD-10-CM

## 2020-02-29 DIAGNOSIS — R188 Other ascites: Secondary | ICD-10-CM

## 2020-02-29 NOTE — Telephone Encounter (Signed)
Patient has been scheduled for 03/08/20.  She understands to come pick up her contrast, instructions and to go to the lab prior to the appt on 03/08/20

## 2020-03-07 ENCOUNTER — Ambulatory Visit: Payer: Medicare HMO | Admitting: Neurology

## 2020-03-08 ENCOUNTER — Inpatient Hospital Stay: Admission: RE | Admit: 2020-03-08 | Payer: Medicare Other | Source: Ambulatory Visit

## 2020-03-18 ENCOUNTER — Telehealth: Payer: Self-pay | Admitting: Gastroenterology

## 2020-03-18 NOTE — Telephone Encounter (Signed)
Patient notified of the rescheduled date of 03/22/20 3:30

## 2020-03-18 NOTE — Telephone Encounter (Signed)
Patient called to cancel CT scheduled 03/19/20 please call to reschedule

## 2020-03-19 ENCOUNTER — Telehealth: Payer: Self-pay

## 2020-03-19 ENCOUNTER — Inpatient Hospital Stay: Admission: RE | Admit: 2020-03-19 | Payer: Medicare Other | Source: Ambulatory Visit

## 2020-03-19 NOTE — Telephone Encounter (Signed)
Patient did not qualify for [facility] Patient Assistance Program. Contact # [phone]

## 2020-03-21 ENCOUNTER — Other Ambulatory Visit (INDEPENDENT_AMBULATORY_CARE_PROVIDER_SITE_OTHER): Payer: Medicare Other

## 2020-03-21 DIAGNOSIS — K7460 Unspecified cirrhosis of liver: Secondary | ICD-10-CM | POA: Diagnosis not present

## 2020-03-21 DIAGNOSIS — R188 Other ascites: Secondary | ICD-10-CM

## 2020-03-21 LAB — BUN: BUN: 10 mg/dL (ref 6–23)

## 2020-03-21 LAB — CREATININE, SERUM: Creatinine, Ser: 0.74 mg/dL (ref 0.40–1.20)

## 2020-03-22 ENCOUNTER — Inpatient Hospital Stay: Admission: RE | Admit: 2020-03-22 | Payer: Medicare Other | Source: Ambulatory Visit

## 2020-03-22 ENCOUNTER — Ambulatory Visit (INDEPENDENT_AMBULATORY_CARE_PROVIDER_SITE_OTHER)
Admission: RE | Admit: 2020-03-22 | Discharge: 2020-03-22 | Disposition: A | Discharge status: Home / Self Care | Payer: Medicare Other | Source: Ambulatory Visit | Attending: Gastroenterology | Admitting: Gastroenterology

## 2020-03-22 ENCOUNTER — Other Ambulatory Visit: Payer: Self-pay

## 2020-03-22 DIAGNOSIS — K8020 Calculus of gallbladder without cholecystitis without obstruction: Secondary | ICD-10-CM | POA: Diagnosis not present

## 2020-03-22 DIAGNOSIS — K7460 Unspecified cirrhosis of liver: Secondary | ICD-10-CM | POA: Diagnosis not present

## 2020-03-22 DIAGNOSIS — R935 Abnormal findings on diagnostic imaging of other abdominal regions, including retroperitoneum: Secondary | ICD-10-CM

## 2020-03-22 MED ORDER — IOHEXOL 300 MG/ML  SOLN
100.0000 mL | Freq: Once | INTRAMUSCULAR | Status: AC | PRN
  Administered 2020-03-22: 100 mL via INTRAVENOUS

## 2020-03-25 ENCOUNTER — Other Ambulatory Visit: Payer: Self-pay

## 2020-03-25 DIAGNOSIS — Z79899 Other long term (current) drug therapy: Secondary | ICD-10-CM | POA: Diagnosis not present

## 2020-03-25 DIAGNOSIS — M25561 Pain in right knee: Secondary | ICD-10-CM | POA: Diagnosis not present

## 2020-03-25 DIAGNOSIS — G8929 Other chronic pain: Secondary | ICD-10-CM | POA: Diagnosis not present

## 2020-03-25 DIAGNOSIS — R188 Other ascites: Secondary | ICD-10-CM

## 2020-03-25 DIAGNOSIS — K7460 Unspecified cirrhosis of liver: Secondary | ICD-10-CM

## 2020-03-25 DIAGNOSIS — J90 Pleural effusion, not elsewhere classified: Secondary | ICD-10-CM

## 2020-03-25 DIAGNOSIS — M545 Low back pain: Secondary | ICD-10-CM | POA: Diagnosis not present

## 2020-03-25 DIAGNOSIS — M25552 Pain in left hip: Secondary | ICD-10-CM | POA: Diagnosis not present

## 2020-04-12 ENCOUNTER — Telehealth: Payer: Self-pay | Admitting: Internal Medicine

## 2020-04-12 ENCOUNTER — Other Ambulatory Visit: Payer: Self-pay

## 2020-04-12 ENCOUNTER — Other Ambulatory Visit (INDEPENDENT_AMBULATORY_CARE_PROVIDER_SITE_OTHER): Payer: Medicare HMO

## 2020-04-12 ENCOUNTER — Ambulatory Visit (INDEPENDENT_AMBULATORY_CARE_PROVIDER_SITE_OTHER)
Admission: RE | Admit: 2020-04-12 | Discharge: 2020-04-12 | Disposition: A | Discharge status: Home / Self Care | Payer: Medicare HMO | Source: Ambulatory Visit | Attending: Gastroenterology | Admitting: Gastroenterology

## 2020-04-12 DIAGNOSIS — K7460 Unspecified cirrhosis of liver: Secondary | ICD-10-CM | POA: Diagnosis not present

## 2020-04-12 DIAGNOSIS — R188 Other ascites: Secondary | ICD-10-CM | POA: Diagnosis not present

## 2020-04-12 DIAGNOSIS — E876 Hypokalemia: Secondary | ICD-10-CM

## 2020-04-12 DIAGNOSIS — J90 Pleural effusion, not elsewhere classified: Secondary | ICD-10-CM

## 2020-04-12 DIAGNOSIS — J984 Other disorders of lung: Secondary | ICD-10-CM | POA: Diagnosis not present

## 2020-04-12 LAB — COMPREHENSIVE METABOLIC PANEL
ALT: 21 U/L (ref 0–35)
AST: 58 U/L — ABNORMAL HIGH (ref 0–37)
Albumin: 3.2 g/dL — ABNORMAL LOW (ref 3.5–5.2)
Alkaline Phosphatase: 105 U/L (ref 39–117)
BUN: 9 mg/dL (ref 6–23)
CO2: 35 mEq/L — ABNORMAL HIGH (ref 19–32)
Calcium: 8.7 mg/dL (ref 8.4–10.5)
Chloride: 98 mEq/L (ref 96–112)
Creatinine, Ser: 0.72 mg/dL (ref 0.40–1.20)
GFR: 82.19 mL/min (ref >60.00)
Glucose, Bld: 100 mg/dL — ABNORMAL HIGH (ref 70–99)
Potassium: 2.8 mEq/L — CL (ref 3.5–5.1)
Sodium: 138 mEq/L (ref 135–145)
Total Bilirubin: 3.3 mg/dL — ABNORMAL HIGH (ref 0.2–1.2)
Total Protein: 6.1 g/dL (ref 6.0–8.3)

## 2020-04-12 MED ORDER — POTASSIUM CHLORIDE ER 20 MEQ PO TBCR: 40.0000 meq | EXTENDED_RELEASE_TABLET | Freq: Two times a day (BID) | ORAL | 0 refills | Status: DC

## 2020-04-12 NOTE — Telephone Encounter (Signed)
Alert K from lab   Lab Results  Component Value Date   CREATININE 0.72 04/12/2020   BUN 9 04/12/2020   NA 138 04/12/2020   K 2.8 (LL) 04/12/2020   CL 98 04/12/2020   CO2 35 (H) 04/12/2020   Not seen in mos and this order relates to a May CT scan  Need to clarify meds she is on before providing further advice  Please contact her

## 2020-04-12 NOTE — Telephone Encounter (Signed)
Patient reports spironolactone 150 mg daily and furosemide 80 mg daily

## 2020-04-12 NOTE — Telephone Encounter (Signed)
K-Dur 40 mEq daily, dispense 20 mEq tablets #20 no refill  Take 2 each day  BMET 04/16/20, order in Dr. Doyne Keel name if he is working next week if not order in my name  We will provide further instructions after the bmet is reviewed  Ignore override morning you will get that the patient is on spironolactone

## 2020-04-12 NOTE — Telephone Encounter (Signed)
Detailed message left for the patient with instructions to begin potassium today and daily until gone.  She is asked to come for labs next week Tuesday and to call back on Monday to confirm she got the message.

## 2020-04-14 NOTE — Telephone Encounter (Signed)
Thanks Glendell Docker, I agree with your plan.  Sheri I had told the patient to reduce her lasix dosing in January due to low K and don't see that she did that or follow up for BMET at that time. Will await her lab next Tuesday following K supplementation. May need to lower her lasix dosing to 60m / day moving forward given low K seems to be chronic on this regimen. Thanks

## 2020-04-19 ENCOUNTER — Telehealth: Payer: Self-pay

## 2020-04-19 ENCOUNTER — Other Ambulatory Visit: Payer: Self-pay

## 2020-04-19 ENCOUNTER — Ambulatory Visit: Payer: Medicare HMO | Admitting: Physician Assistant

## 2020-04-19 DIAGNOSIS — K7031 Alcoholic cirrhosis of liver with ascites: Secondary | ICD-10-CM

## 2020-04-19 NOTE — Telephone Encounter (Signed)
Megan Ferguson, PA-C sent to [employer], [employer], LPN PLEASE CALL PT AND ask her to come in early before her appt tomorrow and get CMET,INR, ammonia    Patient canceled her scheduled appointment due to her transportation. She is now scheduled in July with a different APP.  I have called the patient. No answer. Left her a voicemail asking that she come in for labs soon and to call if she cannot do that or if she has questions. Orders are entered into Epic. Letter mailed to the patient with this message also.

## 2020-04-23 DIAGNOSIS — Z79899 Other long term (current) drug therapy: Secondary | ICD-10-CM | POA: Diagnosis not present

## 2020-04-23 DIAGNOSIS — M25561 Pain in right knee: Secondary | ICD-10-CM | POA: Diagnosis not present

## 2020-04-23 DIAGNOSIS — M25552 Pain in left hip: Secondary | ICD-10-CM | POA: Diagnosis not present

## 2020-04-23 DIAGNOSIS — M7062 Trochanteric bursitis, left hip: Secondary | ICD-10-CM | POA: Diagnosis not present

## 2020-04-23 DIAGNOSIS — G8929 Other chronic pain: Secondary | ICD-10-CM | POA: Diagnosis not present

## 2020-04-25 ENCOUNTER — Telehealth: Payer: Self-pay | Admitting: Gastroenterology

## 2020-04-25 NOTE — Telephone Encounter (Signed)
Megan Roberson this patient has not been compliant with follow up labs as previously requested. Beth left messages for her last week but she has not followed up. Can you ask if she can please come back to the lab for a lab draw? Thanks

## 2020-04-26 NOTE — Telephone Encounter (Signed)
Called and left a detailed message for pt to please go to the lab today or call back.

## 2020-04-29 NOTE — Telephone Encounter (Signed)
Patient called states she will be going for her labs tomorrow.

## 2020-04-30 ENCOUNTER — Other Ambulatory Visit (INDEPENDENT_AMBULATORY_CARE_PROVIDER_SITE_OTHER): Payer: Medicare HMO

## 2020-04-30 DIAGNOSIS — K7031 Alcoholic cirrhosis of liver with ascites: Secondary | ICD-10-CM | POA: Diagnosis not present

## 2020-04-30 LAB — PROTIME-INR
INR: 1.4 ratio — ABNORMAL HIGH (ref 0.8–1.0)
Prothrombin Time: 15.1 s — ABNORMAL HIGH (ref 9.6–13.1)

## 2020-04-30 LAB — COMPREHENSIVE METABOLIC PANEL
ALT: 15 U/L (ref 0–35)
AST: 45 U/L — ABNORMAL HIGH (ref 0–37)
Albumin: 3.3 g/dL — ABNORMAL LOW (ref 3.5–5.2)
Alkaline Phosphatase: 138 U/L — ABNORMAL HIGH (ref 39–117)
BUN: 6 mg/dL (ref 6–23)
CO2: 31 mEq/L (ref 19–32)
Calcium: 8.8 mg/dL (ref 8.4–10.5)
Chloride: 100 mEq/L (ref 96–112)
Creatinine, Ser: 0.71 mg/dL (ref 0.40–1.20)
GFR: 83.52 mL/min (ref >60.00)
Glucose, Bld: 136 mg/dL — ABNORMAL HIGH (ref 70–99)
Potassium: 3.5 mEq/L (ref 3.5–5.1)
Sodium: 137 mEq/L (ref 135–145)
Total Bilirubin: 2.7 mg/dL — ABNORMAL HIGH (ref 0.2–1.2)
Total Protein: 6.1 g/dL (ref 6.0–8.3)

## 2020-04-30 LAB — AMMONIA: Ammonia: 54 umol/L — ABNORMAL HIGH (ref 11–35)

## 2020-05-03 ENCOUNTER — Other Ambulatory Visit: Payer: Self-pay

## 2020-05-03 DIAGNOSIS — E876 Hypokalemia: Secondary | ICD-10-CM

## 2020-05-03 MED ORDER — FUROSEMIDE 80 MG PO TABS: 40.0000 mg | ORAL_TABLET | Freq: Every day | ORAL | 1 refills | Status: DC

## 2020-05-03 MED ORDER — SPIRONOLACTONE 100 MG PO TABS: 150.0000 mg | ORAL_TABLET | Freq: Every day | ORAL | 0 refills | Status: DC

## 2020-05-18 ENCOUNTER — Encounter (HOSPITAL_COMMUNITY): Payer: Self-pay | Admitting: Emergency Medicine

## 2020-05-18 ENCOUNTER — Emergency Department (HOSPITAL_COMMUNITY): Payer: Medicare HMO

## 2020-05-18 ENCOUNTER — Inpatient Hospital Stay (HOSPITAL_COMMUNITY)
Admission: EM | Admit: 2020-05-18 | Discharge: 2020-05-21 | DRG: 641 | Disposition: A | Discharge status: Home / Self Care | Payer: Medicare HMO | Attending: Family Medicine | Admitting: Family Medicine

## 2020-05-18 ENCOUNTER — Other Ambulatory Visit: Payer: Self-pay

## 2020-05-18 DIAGNOSIS — K7030 Alcoholic cirrhosis of liver without ascites: Secondary | ICD-10-CM | POA: Diagnosis not present

## 2020-05-18 DIAGNOSIS — R7989 Other specified abnormal findings of blood chemistry: Secondary | ICD-10-CM

## 2020-05-18 DIAGNOSIS — M810 Age-related osteoporosis without current pathological fracture: Secondary | ICD-10-CM | POA: Diagnosis present

## 2020-05-18 DIAGNOSIS — J9811 Atelectasis: Secondary | ICD-10-CM

## 2020-05-18 DIAGNOSIS — D61818 Other pancytopenia: Secondary | ICD-10-CM | POA: Diagnosis not present

## 2020-05-18 DIAGNOSIS — T502X5A Adverse effect of carbonic-anhydrase inhibitors, benzothiadiazides and other diuretics, initial encounter: Secondary | ICD-10-CM | POA: Diagnosis present

## 2020-05-18 DIAGNOSIS — M1990 Unspecified osteoarthritis, unspecified site: Secondary | ICD-10-CM | POA: Diagnosis present

## 2020-05-18 DIAGNOSIS — R0602 Shortness of breath: Secondary | ICD-10-CM | POA: Diagnosis not present

## 2020-05-18 DIAGNOSIS — J189 Pneumonia, unspecified organism: Secondary | ICD-10-CM

## 2020-05-18 DIAGNOSIS — F419 Anxiety disorder, unspecified: Secondary | ICD-10-CM | POA: Diagnosis not present

## 2020-05-18 DIAGNOSIS — Z886 Allergy status to analgesic agent status: Secondary | ICD-10-CM | POA: Diagnosis not present

## 2020-05-18 DIAGNOSIS — K7031 Alcoholic cirrhosis of liver with ascites: Secondary | ICD-10-CM | POA: Diagnosis not present

## 2020-05-18 DIAGNOSIS — Z79899 Other long term (current) drug therapy: Secondary | ICD-10-CM

## 2020-05-18 DIAGNOSIS — R05 Cough: Secondary | ICD-10-CM

## 2020-05-18 DIAGNOSIS — D696 Thrombocytopenia, unspecified: Secondary | ICD-10-CM | POA: Diagnosis not present

## 2020-05-18 DIAGNOSIS — J90 Pleural effusion, not elsewhere classified: Secondary | ICD-10-CM | POA: Diagnosis present

## 2020-05-18 DIAGNOSIS — D649 Anemia, unspecified: Secondary | ICD-10-CM | POA: Diagnosis not present

## 2020-05-18 DIAGNOSIS — R112 Nausea with vomiting, unspecified: Secondary | ICD-10-CM | POA: Diagnosis not present

## 2020-05-18 DIAGNOSIS — R0600 Dyspnea, unspecified: Secondary | ICD-10-CM | POA: Diagnosis not present

## 2020-05-18 DIAGNOSIS — Z79891 Long term (current) use of opiate analgesic: Secondary | ICD-10-CM

## 2020-05-18 DIAGNOSIS — Z9889 Other specified postprocedural states: Secondary | ICD-10-CM

## 2020-05-18 DIAGNOSIS — Z79890 Hormone replacement therapy (postmenopausal): Secondary | ICD-10-CM

## 2020-05-18 DIAGNOSIS — R188 Other ascites: Secondary | ICD-10-CM | POA: Diagnosis present

## 2020-05-18 DIAGNOSIS — R0689 Other abnormalities of breathing: Secondary | ICD-10-CM

## 2020-05-18 DIAGNOSIS — E876 Hypokalemia: Principal | ICD-10-CM | POA: Diagnosis present

## 2020-05-18 DIAGNOSIS — F329 Major depressive disorder, single episode, unspecified: Secondary | ICD-10-CM | POA: Diagnosis present

## 2020-05-18 DIAGNOSIS — E039 Hypothyroidism, unspecified: Secondary | ICD-10-CM | POA: Diagnosis present

## 2020-05-18 DIAGNOSIS — D6959 Other secondary thrombocytopenia: Secondary | ICD-10-CM | POA: Diagnosis present

## 2020-05-18 DIAGNOSIS — Z880 Allergy status to penicillin: Secondary | ICD-10-CM | POA: Diagnosis not present

## 2020-05-18 DIAGNOSIS — Z888 Allergy status to other drugs, medicaments and biological substances status: Secondary | ICD-10-CM | POA: Diagnosis not present

## 2020-05-18 DIAGNOSIS — E8809 Other disorders of plasma-protein metabolism, not elsewhere classified: Secondary | ICD-10-CM

## 2020-05-18 DIAGNOSIS — R9431 Abnormal electrocardiogram [ECG] [EKG]: Secondary | ICD-10-CM

## 2020-05-18 DIAGNOSIS — Z882 Allergy status to sulfonamides status: Secondary | ICD-10-CM | POA: Diagnosis not present

## 2020-05-18 DIAGNOSIS — R110 Nausea: Secondary | ICD-10-CM

## 2020-05-18 DIAGNOSIS — K7290 Hepatic failure, unspecified without coma: Secondary | ICD-10-CM | POA: Diagnosis present

## 2020-05-18 DIAGNOSIS — K7460 Unspecified cirrhosis of liver: Secondary | ICD-10-CM | POA: Diagnosis present

## 2020-05-18 DIAGNOSIS — J948 Other specified pleural conditions: Secondary | ICD-10-CM | POA: Diagnosis not present

## 2020-05-18 DIAGNOSIS — Z20822 Contact with and (suspected) exposure to covid-19: Secondary | ICD-10-CM | POA: Diagnosis present

## 2020-05-18 DIAGNOSIS — R059 Cough, unspecified: Secondary | ICD-10-CM

## 2020-05-18 LAB — COMPREHENSIVE METABOLIC PANEL
ALT: 20 U/L (ref 0–44)
AST: 51 U/L — ABNORMAL HIGH (ref 15–41)
Albumin: 3 g/dL — ABNORMAL LOW (ref 3.5–5.0)
Alkaline Phosphatase: 113 U/L (ref 38–126)
Anion gap: 10 (ref 5–15)
BUN: 5 mg/dL — ABNORMAL LOW (ref 8–23)
CO2: 31 mmol/L (ref 22–32)
Calcium: 8.2 mg/dL — ABNORMAL LOW (ref 8.9–10.3)
Chloride: 99 mmol/L (ref 98–111)
Creatinine, Ser: 0.69 mg/dL (ref 0.44–1.00)
GFR calc Af Amer: >60 mL/min (ref >60)
GFR calc non Af Amer: >60 mL/min (ref >60)
Glucose, Bld: 93 mg/dL (ref 70–99)
Potassium: 2.5 mmol/L — CL (ref 3.5–5.1)
Sodium: 140 mmol/L (ref 135–145)
Total Bilirubin: 3.8 mg/dL — ABNORMAL HIGH (ref 0.3–1.2)
Total Protein: 6.4 g/dL — ABNORMAL LOW (ref 6.5–8.1)

## 2020-05-18 LAB — CBC WITH DIFFERENTIAL/PLATELET
Abs Immature Granulocytes: 0.02 10*3/uL (ref 0.00–0.07)
Basophils Absolute: 0 10*3/uL (ref 0.0–0.1)
Basophils Relative: 1 %
Eosinophils Absolute: 0.1 10*3/uL (ref 0.0–0.5)
Eosinophils Relative: 3 %
HCT: 36.9 % (ref 36.0–46.0)
Hemoglobin: 11.7 g/dL — ABNORMAL LOW (ref 12.0–15.0)
Immature Granulocytes: 1 %
Lymphocytes Relative: 19 %
Lymphs Abs: 0.8 10*3/uL (ref 0.7–4.0)
MCH: 26.2 pg (ref 26.0–34.0)
MCHC: 31.7 g/dL (ref 30.0–36.0)
MCV: 82.7 fL (ref 80.0–100.0)
Monocytes Absolute: 0.6 10*3/uL (ref 0.1–1.0)
Monocytes Relative: 14 %
Neutro Abs: 2.8 10*3/uL (ref 1.7–7.7)
Neutrophils Relative %: 62 %
Platelets: 33 10*3/uL — ABNORMAL LOW (ref 150–400)
RBC: 4.46 MIL/uL (ref 3.87–5.11)
RDW: 19.7 % — ABNORMAL HIGH (ref 11.5–15.5)
WBC: 4.4 10*3/uL (ref 4.0–10.5)
nRBC: 0 % (ref 0.0–0.2)

## 2020-05-18 LAB — AMMONIA: Ammonia: 37 umol/L — ABNORMAL HIGH (ref 9–35)

## 2020-05-18 LAB — BRAIN NATRIURETIC PEPTIDE: B Natriuretic Peptide: 114 pg/mL — ABNORMAL HIGH (ref 0.0–100.0)

## 2020-05-18 MED ORDER — POTASSIUM CHLORIDE 10 MEQ/100ML IV SOLN
10.0000 meq | INTRAVENOUS | Status: AC
  Administered 2020-05-19 (×5): 10 meq via INTRAVENOUS
  Filled 2020-05-18 (×5): qty 100

## 2020-05-18 MED ORDER — LEVOFLOXACIN IN D5W 750 MG/150ML IV SOLN
750.0000 mg | Freq: Once | INTRAVENOUS | Status: AC
  Administered 2020-05-19: 750 mg via INTRAVENOUS
  Filled 2020-05-18: qty 150

## 2020-05-18 MED ORDER — POTASSIUM CHLORIDE CRYS ER 20 MEQ PO TBCR
40.0000 meq | EXTENDED_RELEASE_TABLET | Freq: Once | ORAL | Status: AC
  Administered 2020-05-18: 40 meq via ORAL
  Filled 2020-05-18: qty 2

## 2020-05-18 MED ORDER — MAGNESIUM SULFATE 2 GM/50ML IV SOLN
2.0000 g | Freq: Once | INTRAVENOUS | Status: AC
  Administered 2020-05-19: 2 g via INTRAVENOUS
  Filled 2020-05-18: qty 50

## 2020-05-18 NOTE — ED Notes (Signed)
CRITICAL VALUE ALERT  Critical Value:  K+ 2.5  Date & Time Notied:  05/18/2020 2250  Provider Notified: Alyse Low, PA  Orders Received/Actions taken: see chart

## 2020-05-18 NOTE — H&P (Signed)
History and Physical  Ellinor Test HYI:[id number] DOB: 07/05/1956 DOA: 05/18/2020  Referring physician: Ezequiel Essex MD PCP: Celene Squibb, MD  Outpatient Specialists: Celene Squibb MD Patient coming from: Home  Chief Complaint: Cough  HPI: Megan Roberson is a 65 y.o. female with medical history significant for history of pancytopenia, cirrhosis of the liver, anemia, and multiple fractures who presents to the Emergency Department due to complaint of several days of onset of nonproductive cough of moderate intensity which has gradually worsened with occasional tenderness in the midsternal area when she coughs. She believes the cough may be related to slight increase in abdomen which she believes to be related to fluid buildup, she states that she had similar presentation in the past which resulted in fluid building up around the lungs. She complains of chronic intermittent shortness of breath that is not related to exertion. She states that she had vomited a brown liquid x1 about 2 days ago and she does complain of ongoing nausea. Patient states that last paracentesis was about a year ago. She denies fever, chills, any worsening shortness of breath  ED Course:  In the emergency department, she was hemodynamically stable, O2 sat was 96% on room air. Work-up in the ED showed thrombocytopenia (chronic), hypokalemia, BNP on room 14, hypoalbuminemia. Chest x-ray showed moderate severity of left basilar atelectasis and/or infiltrate. She was treated with KCl, magnesium and IV Levaquin 745m x 1. Hospitalist was asked to admit. For further evaluation and management.  Review of Systems: Constitutional: Negative for chills and fever.  HENT: Negative for ear pain and sore throat.   Eyes: Negative for pain and visual disturbance.  Respiratory: Positive for cough and occasional shortness of breath (chronic). Negative chest tightness  Cardiovascular: Negative for chest pain and palpitations.   Gastrointestinal: Negative for abdominal pain and vomiting.  Endocrine: Negative for polyphagia and polyuria.  Genitourinary: Negative for decreased urine volume, dysuria, enuresis, hematuria Musculoskeletal: Negative for arthralgias and back pain.  Skin: Negative for color change and rash.  Allergic/Immunologic: Negative for immunocompromised state.  Neurological: Negative for tremors, syncope, speech difficulty, weakness, light-headedness and headaches.  Hematological: Does not bruise/bleed easily.  All other systems reviewed and are negative   Past Medical History:  Diagnosis Date  . Anxiety    pt. denies  . Arthritis   . Cirrhosis of liver ([place]   . Complication of anesthesia    ileus after surgery 2018  . Depression    "not all the time"  . Heart murmur    Echo 10/06/17 ([facility]: Normal LV/RV size, basal septal hypertophy-normal variant, LVEF 65-70%, normal LV/RV function, est RAP 5 mmHg, no sign valvular stenosis or regurg--trace MR/TR/PR  . History of abdominal paracentesis   . History of blood transfusion   . Hypothyroidism   . Ileus ([place]   . Liver disease 2008  . Neuropathy   . Osteoporosis 02/09/2018  . Pancytopenia ([place]   . Pneumonia    2019ish  . Thrombocytopenia ([place] 12/01/2019  . Vaginal Pap smear, abnormal    Past Surgical History:  Procedure Laterality Date  . AUGMENTATION MAMMAPLASTY Bilateral    20 years ago  . BIOPSY  03/15/2018   Procedure: BIOPSY;  Surgeon: AYetta Flock MD;  [facility];  Service: Gastroenterology;;  Gastric  . COLONOSCOPY WITH ESOPHAGOGASTRODUODENOSCOPY (EGD) AND ESOPHAGEAL DILATION (ED)    . COLONOSCOPY WITH PROPOFOL N/A 03/15/2018   Procedure: COLONOSCOPY WITH PROPOFOL;  Surgeon: AYetta Flock MD;  [facility];  Service: Gastroenterology;  Laterality: N/A;  . ESOPHAGOGASTRODUODENOSCOPY (EGD) WITH PROPOFOL N/A 03/15/2018   Procedure: ESOPHAGOGASTRODUODENOSCOPY (EGD) WITH PROPOFOL;  Surgeon:  Yetta Flock, MD;  [facility];  Service: Gastroenterology;  Laterality: N/A;  . ESOPHAGOGASTRODUODENOSCOPY W/ BANDING     Varices  . FRACTURE SURGERY Left    x 2 L elbow and hip left   . HARDWARE REMOVAL Left 09/17/2017   Procedure: HARDWARE REMOVAL LEFT OLECRANON;  Surgeon: Altamese [place], MD;  Location: [place];  Service: Orthopedics;  Laterality: Left;  . HIP SURGERY Left    from fracture- rod in palce  . IR PARACENTESIS  11/18/2017  . IR PARACENTESIS  11/26/2017  . IR PARACENTESIS  03/14/2018  . KNEE SURGERY Right    open incision for ligaments  . ORIF ELBOW FRACTURE Left 04/30/2017   Procedure: REVISION OPEN REDUCTION INTERNAL FIXATION (ORIF) ELBOW/OLECRANON FRACTURE;  Surgeon: Altamese Withee, MD;  Location: [place];  Service: Orthopedics;  Laterality: Left;  . REPAIR EXTENSOR TENDON Left 12/29/2019   Procedure: LEFT LONG AND LEFT RING FRINGER EXTENSOR REALIGNAMENT WITH POSSIBLE TENDON TRANSFER;  Surgeon: Charlotte Crumb, MD;  Location: [place];  Service: Orthopedics;  Laterality: Left;    Social History:  reports that she has never smoked. She has never used smokeless tobacco. She reports that she does not drink alcohol and does not use drugs.   Allergies  Allergen Reactions  . Penicillins Swelling and Other (See Comments)    FACIAL SWELLING Did it involve swelling of the face/tongue/throat, SOB, or low BP? Yes Did it involve sudden or severe rash/hives, skin peeling, or any reaction on the inside of your mouth or nose? No Did you need to seek medical attention at a hospital or doctor's office? Unknown When did it last happen?30 years If all above answers are "NO", may proceed with cephalosporin use.   . Tylenol [Acetaminophen] Nausea Only  . Sulfa Antibiotics Itching and Rash  . Tramadol Swelling and Rash    Family History  Adopted: Yes  Problem Relation Age of Onset  . Alcohol abuse Daughter     Prior to Admission medications   Medication Sig  Start Date End Date Taking? Authorizing Provider  denosumab (PROLIA) 60 MG/ML SOSY injection Inject 60 mg into the skin every 6 (six) months.    [provider]  diclofenac Sodium (VOLTAREN) 1 % GEL SMARTSIG:2-4 Gram(s) Topical 1 to 4 Times Daily 04/23/20   [provider]  furosemide (LASIX) 80 MG tablet Take 0.5 tablets (40 mg total) by mouth daily. 05/03/20   Armbruster, Carlota Raspberry, MD  hydrOXYzine (ATARAX/VISTARIL) 50 MG tablet Take 50 mg by mouth at bedtime. 04/22/20   [provider]  lactulose (CHRONULAC) 10 GM/15ML solution Take 30 mLs (20 g total) by mouth 2 (two) times daily. Patient taking differently: Take 20 g by mouth 3 (three) times daily.  12/01/19   Armbruster, Carlota Raspberry, MD  levOCARNitine (CARNITOR) 330 MG tablet Take 330 mg by mouth 3 (three) times daily.    [provider]  levothyroxine (SYNTHROID, LEVOTHROID) 75 MCG tablet Take 1 tablet (75 mcg total) by mouth daily before breakfast. 12/02/17   Aline August, MD  [facility] 4 MG/0.1ML LIQD nasal spray kit SMARTSIG:1 Spray(s) Both Nares 03/25/20   [provider]  ondansetron (ZOFRAN) 4 MG tablet Take 4 mg by mouth every 8 (eight) hours as needed for nausea or vomiting.    [provider]  oxyCODONE (OXY IR/ROXICODONE) 5 MG immediate release tablet Take  5 mg by mouth daily as needed for pain. 11/23/19   [provider]  Potassium Chloride ER 20 MEQ TBCR Take 40 mEq by mouth 2 (two) times daily for 2 days. 04/12/20 04/14/20  Armbruster, Carlota Raspberry, MD  rifaximin (XIFAXAN) 550 MG TABS tablet Take 1 tablet (550 mg total) by mouth 2 (two) times daily. 11/09/17   Cherene Altes, MD  sertraline (ZOLOFT) 100 MG tablet Take 100 mg by mouth daily. 02/22/20   [provider]  sertraline (ZOLOFT) 50 MG tablet Take 50 mg by mouth daily.    [provider]  spironolactone (ALDACTONE) 100 MG tablet Take 1.5 tablets (150 mg total) by mouth daily. 05/03/20   Armbruster, Carlota Raspberry, MD   traZODone (DESYREL) 50 MG tablet Take 50 mg by mouth at bedtime as needed for sleep.     [provider]    Physical Exam: BP 121/83   Pulse 75   Temp 98.4 F (36.9 C) (Oral)   Resp 16   Ht _0  (1.702 m)   Wt 63.5 kg   SpO2 96%   BMI 21.93 kg/m   . General: 64 y.o. year-old female well developed well nourished in no acute distress.  Alert and oriented x3. Marland Kitchen HEENT: NCAT, EOMI, PERRLA . Neck: Supple, trachea medial . Cardiovascular: Regular rate and rhythm with no rubs or gallops.  No thyromegaly or JVD noted.  No lower extremity edema. 2/4 pulses in all 4 extremities. Marland Kitchen Respiratory: Clear to auscultation with no wheezes or rales. Good inspiratory effort. . Abdomen: Soft, mildly distended and tender to palpation.  Normal bowel sounds x4 quadrants. . Muskuloskeletal: No cyanosis, clubbing or edema noted bilaterally . Neuro: CN II-XII intact, strength, sensation, reflexes . Skin: No ulcerative lesions noted or rashes . Psychiatry: Judgement and insight appear normal. Mood is appropriate for condition and setting          Labs on Admission:  Basic Metabolic Panel: Recent Labs  Lab 05/18/20 2209 05/19/20 0000  NA 140  --   K 2.5*  --   CL 99  --   CO2 31  --   GLUCOSE 93  --   BUN 5*  --   CREATININE 0.69  --   CALCIUM 8.2*  --   MG  --  1.6*   Liver Function Tests: Recent Labs  Lab 05/18/20 2209  AST 51*  ALT 20  ALKPHOS 113  BILITOT 3.8*  PROT 6.4*  ALBUMIN 3.0*   No results for input(s): LIPASE, AMYLASE in the last 168 hours. Recent Labs  Lab 05/18/20 2211  AMMONIA 37*   CBC: Recent Labs  Lab 05/18/20 2209  WBC 4.4  NEUTROABS 2.8  HGB 11.7*  HCT 36.9  MCV 82.7  PLT 33*   Cardiac Enzymes: No results for input(s): CKTOTAL, CKMB, CKMBINDEX, TROPONINI in the last 168 hours.  BNP (last 3 results) Recent Labs    05/18/20 2209  BNP 114.0*    ProBNP (last 3 results) No results for input(s): PROBNP in the last 8760 hours.  CBG: No  results for input(s): GLUCAP in the last 168 hours.  Radiological Exams on Admission: DG Chest Port 1 View  Result Date: 05/18/2020 CLINICAL DATA:  Nonproductive cough for the past 2-3 days with shortness of breath. EXAM: PORTABLE CHEST 1 VIEW COMPARISON:  None. FINDINGS: Mild, stable diffusely increased lung markings are seen, slightly more prominent within the bilateral lung bases. Moderate severity atelectasis and/or infiltrate is seen within the  retrocardiac region of the left lung base. There is no evidence of a pleural effusion or pneumothorax. The heart size and mediastinal contours are within normal limits. Degenerative changes seen throughout the thoracic spine. IMPRESSION: Moderate severity left basilar atelectasis and/or infiltrate. Electronically Signed   By: Virgina Norfolk M.D.   On: 05/18/2020 22:20    EKG: I independently viewed the EKG done and my findings are as followed: Sinus rhythm at rate of 81 with prolonged QTc (513m)  Assessment/Plan Present on Admission: . Hypokalemia . Cirrhosis of liver ([place] . Thrombocytopenia (HCC)  Principal Problem:   Hypokalemia Active Problems:   Cirrhosis of liver (HCC)   Thrombocytopenia (HCC)   Nausea & vomiting   Prolonged QT interval   Elevated brain natriuretic peptide (BNP) level   Hypoalbuminemia   Cough   Atelectasis   Hypomagnesemia  Hypokalemia and hypomagnesemia K+2.5, Mg 1.6 This is possibly secondary to diuretic effect Electrolytes will be replenished Continue to monitor electrolytes with morning labs  Prolonged QTc (5122m This may be due to electrolyte abnormalities as described above Avoid QT prolonging drugs Repeat EKG in the morning  Elevated BNP (114) This may be a reactive process possibly due to hyperdynamic state considering patient's history of alcoholic cirrhosis.  Though chest x-ray shows that the heart size and mediastinal contours are within normal limits, it would be reasonable to rule out  possible cirrhotic cardiomyopathy considering patient's history of chronic and intermittent shortness of breath. No prior echocardiogram on record, echocardiogram will be done in the morning  Cough/atelectasis Chest x-ray showed moderate severity of left basilar atelectasis and/or infiltrate. Continue Robitussin Continue incentive spirometry and flutter valve  Questionable CAP POA Patient was started on IV levofloxacin due to suspicion for pneumonia, however, patient was afebrile, CBC was normal, chest x-ray unconvincing for pneumonia. IV antibiotics will be held at this time pending procalcitonin in the morning.  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  Stable  Hypoalbuminemia possible secondary mild protein calorie malnutrition and/ liver cirrhosis Stable, protein supplement will be provided   DVT prophylaxis: SCDs; no indication for chemoprophylaxis at this time due to thrombocytopenia  Code Status: Full code  Family Communication: None at bedside  Disposition Plan:  Patient is from:                        home Anticipated DC to:                   home Anticipated DC date:               2-3 days Anticipated DC barriers:          Patient unstable to discharge at this time due to need to correct electrolyte abnormalities and prolonged QT   Consults called: None  Admission status: Inpatient   OlBernadette HoitD [facility]  If 7PM-7AM, please contact night-coverage [email or web address]  05/19/2020, 12:44 AM

## 2020-05-18 NOTE — ED Triage Notes (Signed)
Pt c/o a nonproductive cough for the past 2-3 days. Denies fevers, endorses N/V yesterday.

## 2020-05-18 NOTE — ED Provider Notes (Addendum)
[facility] EMERGENCY DEPARTMENT Provider Note   CSN: [id number] Arrival date & time: 05/18/20  2028     History Chief Complaint  Patient presents with  . Cough    Megan Roberson is a 64 y.o. female.  The history is provided by the patient. No language interpreter was used.  Cough Cough characteristics:  Non-productive Sputum characteristics:  Nondescript Severity:  Moderate Onset quality:  Gradual Timing:  Constant Progression:  Worsening Chronicity:  New Relieved by:  Nothing Worsened by:  Nothing Ineffective treatments:  None tried Associated symptoms: no chest pain    Pt complains of a cough.  Pt reports she has a history of liver disease and has had to have paracentesis in the past second to fluid build up.  Pt complains her belly feels tight and she has a cough     Past Medical History:  Diagnosis Date  . Anxiety    pt. denies  . Arthritis   . Cirrhosis of liver ([place])   . Complication of anesthesia    ileus after surgery 2018  . Depression    "not all the time"  . Heart murmur    Echo 10/06/17 [facility]): Normal LV/RV size, basal septal hypertophy-normal variant, LVEF 65-70%, normal LV/RV function, est RAP 5 mmHg, no sign valvular stenosis or regurg--trace MR/TR/PR  . History of abdominal paracentesis   . History of blood transfusion   . Hypothyroidism   . Ileus ([place])   . Liver disease 2008  . Neuropathy   . Osteoporosis 02/09/2018  . Pancytopenia ([place])   . Pneumonia    2019ish  . Thrombocytopenia ([place]) 12/01/2019  . Vaginal Pap smear, abnormal     Patient Active Problem List   Diagnosis Date Noted  . Cysts of both ovaries 06/15/2019  . Closed fracture of left tibial plateau 05/17/18 07/08/2018  . Esophageal varices without bleeding ([place])   . Osteoporosis 02/09/2018  . Colon cancer screening 01/18/2018  . Ascites 11/25/2017  . Cellulitis, abdominal wall 11/25/2017  . SOB (shortness of breath) 11/25/2017  . Protein-calorie malnutrition, severe  11/06/2017  . Ogilvie's syndrome   . Abdominal distension   . Abdominal pain   . Liver failure without hepatic coma ([place])   . Thrombocytopenia ([place])   . Closed fracture of lumbar spine without lesion of spinal cord ([place])   . Cirrhosis of liver ([place]) 04/21/2017  . Anemia 04/21/2017  . History of open reduction and internal fixation (ORIF) procedure 04/21/2017  . Visit for suture removal 04/21/2017  . Left Olecranon fracture 04/21/2017  . Hypokalemia 04/21/2017    Past Surgical History:  Procedure Laterality Date  . AUGMENTATION MAMMAPLASTY Bilateral    20 years ago  . BIOPSY  03/15/2018   Procedure: BIOPSY;  Surgeon: Yetta Flock, MD;  [facility];  Service: Gastroenterology;;  Gastric  . COLONOSCOPY WITH ESOPHAGOGASTRODUODENOSCOPY (EGD) AND ESOPHAGEAL DILATION (ED)    . COLONOSCOPY WITH PROPOFOL N/A 03/15/2018   Procedure: COLONOSCOPY WITH PROPOFOL;  Surgeon: Yetta Flock, MD;  [facility];  Service: Gastroenterology;  Laterality: N/A;  . ESOPHAGOGASTRODUODENOSCOPY (EGD) WITH PROPOFOL N/A 03/15/2018   Procedure: ESOPHAGOGASTRODUODENOSCOPY (EGD) WITH PROPOFOL;  Surgeon: Yetta Flock, MD;  [facility];  Service: Gastroenterology;  Laterality: N/A;  . ESOPHAGOGASTRODUODENOSCOPY W/ BANDING     Varices  . FRACTURE SURGERY Left    x 2 L elbow and hip left   . HARDWARE REMOVAL Left 09/17/2017   Procedure: HARDWARE REMOVAL LEFT OLECRANON;  Surgeon: Altamese [place],  MD;  Location: [place];  Service: Orthopedics;  Laterality: Left;  . HIP SURGERY Left    from fracture- rod in palce  . IR PARACENTESIS  11/18/2017  . IR PARACENTESIS  11/26/2017  . IR PARACENTESIS  03/14/2018  . KNEE SURGERY Right    open incision for ligaments  . ORIF ELBOW FRACTURE Left 04/30/2017   Procedure: REVISION OPEN REDUCTION INTERNAL FIXATION (ORIF) ELBOW/OLECRANON FRACTURE;  Surgeon: Altamese [place], MD;  Location: [place];  Service: Orthopedics;  Laterality: Left;   . REPAIR EXTENSOR TENDON Left 12/29/2019   Procedure: LEFT LONG AND LEFT RING FRINGER EXTENSOR REALIGNAMENT WITH POSSIBLE TENDON TRANSFER;  Surgeon: Charlotte Crumb, MD;  Location: [place];  Service: Orthopedics;  Laterality: Left;     OB History    Gravida  2   Para  2   Term  2   Preterm      AB      Living  1     SAB      TAB      Ectopic      Multiple      Live Births  2           Family History  Adopted: Yes  Problem Relation Age of Onset  . Alcohol abuse Daughter     Social History   Tobacco Use  . Smoking status: Never Smoker  . Smokeless tobacco: Never Used  Vaping Use  . Vaping Use: Never used  Substance Use Topics  . Alcohol use: No  . Drug use: No    Home Medications Prior to Admission medications   Medication Sig Start Date End Date Taking? Authorizing Provider  denosumab (PROLIA) 60 MG/ML SOSY injection Inject 60 mg into the skin every 6 (six) months.    [provider]  furosemide (LASIX) 80 MG tablet Take 0.5 tablets (40 mg total) by mouth daily. 05/03/20   Armbruster, Carlota Raspberry, MD  lactulose (CHRONULAC) 10 GM/15ML solution Take 30 mLs (20 g total) by mouth 2 (two) times daily. Patient taking differently: Take 20 g by mouth 3 (three) times daily.  12/01/19   Armbruster, Carlota Raspberry, MD  levOCARNitine (CARNITOR) 330 MG tablet Take 330 mg by mouth 3 (three) times daily.    [provider]  levothyroxine (SYNTHROID, LEVOTHROID) 75 MCG tablet Take 1 tablet (75 mcg total) by mouth daily before breakfast. 12/02/17   Aline August, MD  ondansetron (ZOFRAN) 4 MG tablet Take 4 mg by mouth every 8 (eight) hours as needed for nausea or vomiting.    [provider]  oxyCODONE (OXY IR/ROXICODONE) 5 MG immediate release tablet Take 5 mg by mouth daily as needed for pain. 11/23/19   [provider]  Potassium Chloride ER 20 MEQ TBCR Take 40 mEq by mouth 2 (two) times daily for 2 days. 04/12/20 04/14/20  Armbruster, Carlota Raspberry, MD   rifaximin (XIFAXAN) 550 MG TABS tablet Take 1 tablet (550 mg total) by mouth 2 (two) times daily. 11/09/17   Cherene Altes, MD  sertraline (ZOLOFT) 50 MG tablet Take 50 mg by mouth daily.    [provider]  spironolactone (ALDACTONE) 100 MG tablet Take 1.5 tablets (150 mg total) by mouth daily. 05/03/20   Armbruster, Carlota Raspberry, MD  traZODone (DESYREL) 50 MG tablet Take 50 mg by mouth at bedtime as needed for sleep.     [provider]    Allergies    Penicillins, Tylenol [acetaminophen], Sulfa antibiotics, and Tramadol  Review  of Systems   Review of Systems  Respiratory: Positive for cough.   Cardiovascular: Negative for chest pain.  All other systems reviewed and are negative.   Physical Exam Updated Vital Signs BP (!) 132/94 (BP Location: Right Arm)   Pulse 83   Temp 99.4 F (37.4 C) (Oral)   Resp 16   Ht 5' 7"  (1.702 m)   Wt 63.5 kg   SpO2 96%   BMI 21.93 kg/m   Physical Exam Vitals and nursing note reviewed.  Constitutional:      Appearance: She is well-developed.  HENT:     Head: Normocephalic.     Mouth/Throat:     Mouth: Mucous membranes are moist.  Eyes:     General: No scleral icterus.    Pupils: Pupils are equal, round, and reactive to light.  Cardiovascular:     Rate and Rhythm: Normal rate.     Pulses: Normal pulses.  Pulmonary:     Effort: Pulmonary effort is normal.  Abdominal:     General: There is distension.     Tenderness: There is abdominal tenderness. There is no guarding.  Musculoskeletal:        General: Normal range of motion.     Cervical back: Normal range of motion.  Skin:    General: Skin is warm.  Neurological:     General: No focal deficit present.     Mental Status: She is alert and oriented to person, place, and time.  Psychiatric:        Mood and Affect: Mood normal.     ED Results / Procedures / Treatments   Labs (all labs ordered are listed, but only abnormal results are displayed) Labs Reviewed   CBC WITH DIFFERENTIAL/PLATELET  COMPREHENSIVE METABOLIC PANEL    EKG None  Radiology No results found.  Procedures Procedures (including critical care time)  Medications Ordered in ED Medications - No data to display  ED Course  I have reviewed the triage vital signs and the nursing notes.  Pertinent labs & imaging results that were available during my care of the patient were reviewed by me and considered in my medical decision making (see chart for details).    MDM Rules/Calculators/A&P                          MDM:  Potassium is 2.5, IV potassium ordered and oral potassium.  Pt's care turned over to Dr. Wyvonnia Dusky at 11pm.  Final Clinical Impression(s) / ED Diagnoses Final diagnoses:  Hypokalemia    Rx / DC Orders ED Discharge Orders    None       Sidney Ace 05/18/20 2201    Fransico Meadow, PA-C 05/18/20 2257    Ezequiel Essex, MD 05/19/20 [phone]

## 2020-05-19 ENCOUNTER — Inpatient Hospital Stay (HOSPITAL_COMMUNITY): Payer: Medicare HMO

## 2020-05-19 DIAGNOSIS — R7989 Other specified abnormal findings of blood chemistry: Secondary | ICD-10-CM

## 2020-05-19 DIAGNOSIS — R0602 Shortness of breath: Secondary | ICD-10-CM

## 2020-05-19 DIAGNOSIS — E876 Hypokalemia: Principal | ICD-10-CM

## 2020-05-19 DIAGNOSIS — R9431 Abnormal electrocardiogram [ECG] [EKG]: Secondary | ICD-10-CM

## 2020-05-19 DIAGNOSIS — J9811 Atelectasis: Secondary | ICD-10-CM

## 2020-05-19 DIAGNOSIS — R059 Cough, unspecified: Secondary | ICD-10-CM

## 2020-05-19 DIAGNOSIS — E8809 Other disorders of plasma-protein metabolism, not elsewhere classified: Secondary | ICD-10-CM

## 2020-05-19 DIAGNOSIS — R110 Nausea: Secondary | ICD-10-CM

## 2020-05-19 HISTORY — PX: TRANSTHORACIC ECHOCARDIOGRAM: SHX275

## 2020-05-19 LAB — COMPREHENSIVE METABOLIC PANEL
ALT: 18 U/L (ref 0–44)
AST: 42 U/L — ABNORMAL HIGH (ref 15–41)
Albumin: 2.3 g/dL — ABNORMAL LOW (ref 3.5–5.0)
Alkaline Phosphatase: 84 U/L (ref 38–126)
Anion gap: 6 (ref 5–15)
BUN: 6 mg/dL — ABNORMAL LOW (ref 8–23)
CO2: 29 mmol/L (ref 22–32)
Calcium: 7.5 mg/dL — ABNORMAL LOW (ref 8.9–10.3)
Chloride: 101 mmol/L (ref 98–111)
Creatinine, Ser: 0.6 mg/dL (ref 0.44–1.00)
GFR calc Af Amer: >60 mL/min (ref >60)
GFR calc non Af Amer: >60 mL/min (ref >60)
Glucose, Bld: 80 mg/dL (ref 70–99)
Potassium: 3.2 mmol/L — ABNORMAL LOW (ref 3.5–5.1)
Sodium: 136 mmol/L (ref 135–145)
Total Bilirubin: 2.7 mg/dL — ABNORMAL HIGH (ref 0.3–1.2)
Total Protein: 4.9 g/dL — ABNORMAL LOW (ref 6.5–8.1)

## 2020-05-19 LAB — PROTIME-INR
INR: 1.9 — ABNORMAL HIGH (ref 0.8–1.2)
Prothrombin Time: 21.1 seconds — ABNORMAL HIGH (ref 11.4–15.2)

## 2020-05-19 LAB — ECHOCARDIOGRAM COMPLETE
Area-P 1/2: 2.75 cm2
Height: 67 in
Weight: 2211.65 oz

## 2020-05-19 LAB — LACTIC ACID, PLASMA
Lactic Acid, Venous: 1.2 mmol/L (ref 0.5–1.9)
Lactic Acid, Venous: 1.4 mmol/L (ref 0.5–1.9)

## 2020-05-19 LAB — HIV ANTIBODY (ROUTINE TESTING W REFLEX): HIV Screen 4th Generation wRfx: NONREACTIVE

## 2020-05-19 LAB — MAGNESIUM
Magnesium: 1.6 mg/dL — ABNORMAL LOW (ref 1.7–2.4)
Magnesium: 2 mg/dL (ref 1.7–2.4)

## 2020-05-19 LAB — SARS CORONAVIRUS 2 BY RT PCR (HOSPITAL ORDER, PERFORMED IN ~~LOC~~ HOSPITAL LAB): SARS Coronavirus 2: NEGATIVE

## 2020-05-19 LAB — PHOSPHORUS: Phosphorus: 1.9 mg/dL — ABNORMAL LOW (ref 2.5–4.6)

## 2020-05-19 LAB — PROCALCITONIN: Procalcitonin: <0.1 ng/mL

## 2020-05-19 LAB — APTT: aPTT: 43 seconds — ABNORMAL HIGH (ref 24–36)

## 2020-05-19 MED ORDER — PERFLUTREN LIPID MICROSPHERE
1.0000 mL | INTRAVENOUS | Status: AC | PRN
  Administered 2020-05-19: 3 mL via INTRAVENOUS
  Filled 2020-05-19: qty 10

## 2020-05-19 MED ORDER — GUAIFENESIN ER 600 MG PO TB12
600.0000 mg | ORAL_TABLET | Freq: Two times a day (BID) | ORAL | Status: DC
  Administered 2020-05-19 – 2020-05-21 (×4): 600 mg via ORAL
  Filled 2020-05-19 (×4): qty 1

## 2020-05-19 MED ORDER — SODIUM CHLORIDE 0.9 % IV SOLN
1.0000 g | INTRAVENOUS | Status: DC
  Administered 2020-05-19 – 2020-05-20 (×2): 1 g via INTRAVENOUS
  Filled 2020-05-19 (×2): qty 10

## 2020-05-19 MED ORDER — PROCHLORPERAZINE EDISYLATE 10 MG/2ML IJ SOLN
5.0000 mg | Freq: Four times a day (QID) | INTRAMUSCULAR | Status: DC | PRN
  Administered 2020-05-19 – 2020-05-21 (×3): 5 mg via INTRAVENOUS
  Filled 2020-05-19 (×3): qty 2

## 2020-05-19 MED ORDER — POTASSIUM CHLORIDE CRYS ER 20 MEQ PO TBCR
40.0000 meq | EXTENDED_RELEASE_TABLET | ORAL | Status: AC
  Administered 2020-05-19 (×2): 40 meq via ORAL
  Filled 2020-05-19 (×2): qty 2

## 2020-05-19 MED ORDER — PHYTONADIONE 5 MG PO TABS
5.0000 mg | ORAL_TABLET | Freq: Once | ORAL | Status: AC
  Administered 2020-05-20: 5 mg via ORAL
  Filled 2020-05-19 (×2): qty 1

## 2020-05-19 MED ORDER — DOXYCYCLINE HYCLATE 100 MG PO TABS
100.0000 mg | ORAL_TABLET | Freq: Two times a day (BID) | ORAL | Status: DC
  Administered 2020-05-19 – 2020-05-21 (×4): 100 mg via ORAL
  Filled 2020-05-19 (×4): qty 1

## 2020-05-19 MED ORDER — ENSURE ENLIVE PO LIQD
237.0000 mL | Freq: Two times a day (BID) | ORAL | Status: DC
  Administered 2020-05-19 – 2020-05-20 (×3): 237 mL via ORAL

## 2020-05-19 MED ORDER — GUAIFENESIN-DM 100-10 MG/5ML PO SYRP
5.0000 mL | ORAL_SOLUTION | ORAL | Status: DC | PRN
  Administered 2020-05-19 – 2020-05-21 (×4): 5 mL via ORAL
  Filled 2020-05-19 (×4): qty 5

## 2020-05-19 NOTE — Progress Notes (Signed)
  Echocardiogram 2D Echocardiogram has been performed.  Megan Roberson 05/19/2020, 3:35 PM

## 2020-05-19 NOTE — Progress Notes (Addendum)
Patient Demographics:    Megan Roberson, is a 64 y.o. female, DOB - [birth year], VFI:[id number]  Admit date - 05/18/2020   Admitting Physician Bernadette Hoit, DO  Outpatient Primary MD for the patient is Celene Squibb, MD  LOS - 1   Chief Complaint  Patient presents with  . Cough        Subjective:    Megan Roberson today has no fevers, no emesis,  No chest pain,   --- Less short of breath after thoracentesis -Cough persist  Assessment  & Plan :    Principal Problem:   Hypokalemia Active Problems:   Cirrhosis of liver (HCC)   Thrombocytopenia (HCC)   Nausea & vomiting   Prolonged QT interval   Elevated brain natriuretic peptide (BNP) level   Hypoalbuminemia   Cough   Atelectasis   Hypomagnesemia  Brief Summary:-  64 y.o. female who is a reformed alcoholic with medical history significant for history of pancytopenia, cirrhosis of the liver, anemia 05/18/2020 with hypokalemia hypomagnesemia in the setting of diuretic use as well as concerns about possible pneumonia/left-sided lung infection/effusion  A/p 1)Lt Sided CAP--with moderate effusion,  -Status post diagnostic and therapeutic left-sided thoracentesis on 05/20/2020  -Shortness of breath improved  --Continue iv Rocephin and doxycycline,  -Bronchiodilators  and mucolytics as ordered -Echocardiogram was attempted due to dyspnea and pleural effusion--- Echo quality is poor with limited info -Repeat chest x-ray in a.m.  2) hypokalemia/hypomagnesemia--due to diuretic use, patient denies significant GI losses -Replace and recheck  3) alcoholic liver cirrhosis with ascites--- history of esophageal varices and thrombocytopenia - -as per Dr. Havery Moros patient does not meet transplant criteria, also patient was told she is not a good  candidate for TIPS due to history of encephalopathy -be judicious with diuretics due to electrolyte  abnormalities -Ammonia is 37,, INR is 1.9 -Platelets at 33 -Unsuccessful paracentesis on 05/20/2020 due to insufficient amount of ascitic fluid  4) prolonged QT--in the setting of electrolyte abnormalities, avoid other QT prolonging agents, replace electrolytes -Echocardiogram poor quality with limited information   Disposition/Need for in-Hospital Stay- patient unable to be discharged at this time due to --pneumonia requiring IV antibiotics, as well as pleural effusion and ascites requiring diagnostic tap  Status is: Inpatient  Remains inpatient appropriate because:pneumonia requiring IV antibiotics, as well as pleural effusion and ascites requiring diagnostic tap   Disposition: The patient is from: Home              Anticipated d/c is to: Home              Anticipated d/c date is: 2 days              Patient currently is not medically stable to d/c. Barriers: Not Clinically Stable- -pneumonia requiring IV antibiotics, as well as pleural effusion and ascites requiring diagnostic tap  Code Status : full  Procedures:- 05/20/20 --Left-sided thoracentesis with removal of 430 ml of yellow-colored fluid -- -Unsuccessful paracentesis on 05/20/2020 due to insufficient amount of ascitic fluid  Family Communication:    (patient is alert, awake and coherent)    Consults  :  IR for thoracentesis and paracentesis  DVT Prophylaxis  :  TEDs/SCD  Lab Results  Component Value Date  PLT 33 (L) 05/18/2020   Inpatient Medications  Scheduled Meds: . feeding supplement (ENSURE ENLIVE)  237 mL Oral BID BM   Continuous Infusions: PRN Meds:.guaiFENesin-dextromethorphan, perflutren lipid microspheres (DEFINITY) IV suspension, prochlorperazine    Anti-infectives (From admission, onward)   Start     Dose/Rate Route Frequency Ordered Stop   05/18/20 2315  levofloxacin (LEVAQUIN) IVPB 750 mg        750 mg 100 mL/hr over 90 Minutes Intravenous  Once 05/18/20 2314 05/19/20 0142         Objective:   Vitals:   05/19/20 0053 05/19/20 0554 05/19/20 0900 05/19/20 1244  BP: 118/80 113/76 111/84 138/85  Pulse: 80 65 80 79  Resp: 16 16  20   Temp: 98.6 F (37 C) 98.4 F (36.9 C)  99 F (37.2 C)  TempSrc: Oral Oral  Oral  SpO2: 96% 96% 95% 95%  Weight: 62.7 kg     Height: 5' 7"  (1.702 m)       Wt Readings from Last 3 Encounters:  05/19/20 62.7 kg  12/29/19 65.8 kg  12/01/19 66.4 kg     Intake/Output Summary (Last 24 hours) at 05/19/2020 1607 Last data filed at 05/19/2020 0122 Gross per 24 hour  Intake 300 ml  Output --  Net 300 ml     Physical Exam  Gen:- Awake Alert,  In no apparent distress  HEENT:- [place].AT, No sclera icterus Neck-Supple Neck,No JVD,.  Lungs-improved air movement on the left, no wheezing CV- S1, S2 normal, regular  Abd-  +ve B.Sounds, Abd Soft, No tenderness,  Extremity/Skin:-Trace edema, pedal pulses present  Psych-affect is appropriate, oriented x3 Neuro-generalized weakness, no new focal deficits, no tremors   Data Review:   Micro Results Recent Results (from the past 240 hour(s))  SARS Coronavirus 2 by RT PCR (hospital order, performed in [facility] hospital lab) Nasopharyngeal Nasopharyngeal Swab     Status: None   Collection Time: 05/18/20 11:30 PM   Specimen: Nasopharyngeal Swab  Result Value Ref Range Status   SARS Coronavirus 2 NEGATIVE NEGATIVE Final    Comment: (NOTE) SARS-CoV-2 target nucleic acids are NOT DETECTED.  The SARS-CoV-2 RNA is generally detectable in upper and lower respiratory specimens during the acute phase of infection. The lowest concentration of SARS-CoV-2 viral copies this assay can detect is 250 copies / mL. A negative result does not preclude SARS-CoV-2 infection and should not be used as the sole basis for treatment or other patient management decisions.  A negative result may occur with improper specimen collection / handling, submission of specimen other than nasopharyngeal swab, presence of  viral mutation(s) within the areas targeted by this assay, and inadequate number of viral copies (<250 copies / mL). A negative result must be combined with clinical observations, patient history, and epidemiological information.  Fact Sheet for Patients:   StrictlyIdeas.no  Fact Sheet for Healthcare Providers: BankingDealers.co.za  This test is not yet approved or  cleared by the [place] FDA and has been authorized for detection and/or diagnosis of SARS-CoV-2 by FDA under an Emergency Use Authorization (EUA).  This EUA will remain in effect (meaning this test can be used) for the duration of the COVID-19 declaration under Section 564(b)(1) of the Act, 21 U.S.C. section 360bbb-3(b)(1), unless the authorization is terminated or revoked sooner.  Performed at [facility], [address], [place], [place] [address]   Blood culture (routine x 2)     Status: None (Preliminary result)   Collection Time: 05/19/20 12:00 AM  Specimen: BLOOD RIGHT FOREARM  Result Value Ref Range Status   Specimen Description BLOOD RIGHT FOREARM  Final   Special Requests   Final    BOTTLES DRAWN AEROBIC AND ANAEROBIC Blood Culture adequate volume   Culture   Final    NO GROWTH < 12 HOURS Performed at [facility], [address] [place], [place] [address]    Report Status PENDING  Incomplete  Blood culture (routine x 2)     Status: None (Preliminary result)   Collection Time: 05/19/20 12:15 AM   Specimen: BLOOD RIGHT ARM  Result Value Ref Range Status   Specimen Description BLOOD RIGHT ARM  Final   Special Requests   Final    BOTTLES DRAWN AEROBIC AND ANAEROBIC Blood Culture adequate volume   Culture   Final    NO GROWTH < 12 HOURS Performed at [facility], [address] [place], [place] [address]    Report Status PENDING  Incomplete    Radiology Reports DG Chest 2 View  Result Date: 05/19/2020 CLINICAL DATA:  Dyspnea. EXAM: CHEST - 2 VIEW  COMPARISON:  May 18, 2020 FINDINGS: Stable cardiomediastinal silhouette. No pneumothorax is noted. Moderate size left pleural effusion is noted with underlying atelectasis or infiltrate. Right lung is clear. Bony thorax is unremarkable. IMPRESSION: Moderate size left pleural effusion with underlying atelectasis or infiltrate. Electronically Signed   By: Marijo Conception M.D.   On: 05/19/2020 15:58   DG Chest Port 1 View  Result Date: 05/18/2020 CLINICAL DATA:  Nonproductive cough for the past 2-3 days with shortness of breath. EXAM: PORTABLE CHEST 1 VIEW COMPARISON:  None. FINDINGS: Mild, stable diffusely increased lung markings are seen, slightly more prominent within the bilateral lung bases. Moderate severity atelectasis and/or infiltrate is seen within the retrocardiac region of the left lung base. There is no evidence of a pleural effusion or pneumothorax. The heart size and mediastinal contours are within normal limits. Degenerative changes seen throughout the thoracic spine. IMPRESSION: Moderate severity left basilar atelectasis and/or infiltrate. Electronically Signed   By: Virgina Norfolk M.D.   On: 05/18/2020 22:20   ECHOCARDIOGRAM COMPLETE  Result Date: 05/19/2020    ECHOCARDIOGRAM REPORT   Patient Name:   Megan Roberson Date of Exam: 05/19/2020 Medical Rec #:  [id number]      Height:       67.0 in Accession #:    [id number]     Weight:       138.2 lb Date of Birth:  [birth year]     BSA:          1.728 m Patient Age:    43 years       BP:           138/85 mmHg Patient Gender: F              HR:           79 bpm. Exam Location:  [facility] Procedure: 2D Echo Indications:    425.9 cardiomyopathy  History:        Patient has no prior history of Echocardiogram examinations.                 Signs/Symptoms:Murmur. Liver disease.  Sonographer:    Jannett Celestine RDCS (AE) Referring Phys: [id number] OLADAPO ADEFESO  Sonographer Comments: Technically difficult study due to poor echo windows, no parasternal  window and suboptimal apical window. Image acquisition challenging due to respiratory motion. see comments IMPRESSIONS  1. Extremely poor acoustic windows  limit study even with Definity No parasternal window. Overall LVEF is normal. Not all wall segments visualized. The left ventricle has normal function.  2. Right ventricular systolic function is normal. The right ventricular size is normal.  3. The mitral valve is grossly normal. Trivial mitral valve regurgitation.  4. Extremely poor acoustic windows limit study. FINDINGS  Left Ventricle: Extremely poor acoustic windows limit study even with Definity No parasternal window. Overall LVEF is normal. Not all wall segments visualized. Left ventricular ejection fraction, by estimation, is See note%. The left ventricle has normal function. The left ventricle has no regional wall motion abnormalities. Definity contrast agent was given IV to delineate the left ventricular endocardial borders. The left ventricular internal cavity size was normal in size. There is not measureed. left ventricular hypertrophy. Left ventricular diastolic function could not be evaluated. Right Ventricle: The right ventricular size is normal. Right vetricular wall thickness was not assessed. Right ventricular systolic function is normal. Left Atrium: Left atrial size was normal in size. Right Atrium: Right atrial size was normal in size. Pericardium: There is no evidence of pericardial effusion. Mitral Valve: The mitral valve is grossly normal. Trivial mitral valve regurgitation. Tricuspid Valve: The tricuspid valve is grossly normal. Tricuspid valve regurgitation is trivial. Aortic Valve: The aortic valve was not well visualized. Aortic valve regurgitation is not visualized. Pulmonic Valve: The pulmonic valve was not assessed. Pulmonic valve regurgitation not assessed. Aorta: The aortic root was not well visualized. Venous: The inferior vena cava is normal in size with greater than 50%  respiratory variability, suggesting right atrial pressure of 3 mmHg. IAS/Shunts: No atrial level shunt detected by color flow Doppler.   Diastology LV e' lateral:   12.50 cm/s LV E/e' lateral: 6.7 LV e' medial:    11.60 cm/s LV E/e' medial:  7.2  MITRAL VALVE MV Area (PHT): 2.75 cm MV Decel Time: 276 msec MV E velocity: 84.00 cm/s MV A velocity: 62.60 cm/s MV E/A ratio:  1.34 Dorris Carnes MD Electronically signed by Dorris Carnes MD Signature Date/Time: 05/19/2020/3:58:04 PM    Final      CBC Recent Labs  Lab 05/18/20 2209  WBC 4.4  HGB 11.7*  HCT 36.9  PLT 33*  MCV 82.7  MCH 26.2  MCHC 31.7  RDW 19.7*  LYMPHSABS 0.8  MONOABS 0.6  EOSABS 0.1  BASOSABS 0.0    Chemistries  Recent Labs  Lab 05/18/20 2209 05/19/20 0000 05/19/20 0529  NA 140  --  136  K 2.5*  --  3.2*  CL 99  --  101  CO2 31  --  29  GLUCOSE 93  --  80  BUN 5*  --  6*  CREATININE 0.69  --  0.60  CALCIUM 8.2*  --  7.5*  MG  --  1.6* 2.0  AST 51*  --  42*  ALT 20  --  18  ALKPHOS 113  --  84  BILITOT 3.8*  --  2.7*   ------------------------------------------------------------------------------------------------------------------ No results for input(s): CHOL, HDL, LDLCALC, TRIG, CHOLHDL, LDLDIRECT in the last 72 hours.  No results found for: HGBA1C ------------------------------------------------------------------------------------------------------------------ No results for input(s): TSH, T4TOTAL, T3FREE, THYROIDAB in the last 72 hours.  Invalid input(s): FREET3 ------------------------------------------------------------------------------------------------------------------ No results for input(s): VITAMINB12, FOLATE, FERRITIN, TIBC, IRON, RETICCTPCT in the last 72 hours.  Coagulation profile Recent Labs  Lab 05/19/20 0529  INR 1.9*    No results for input(s): DDIMER in the last 72 hours.  Cardiac Enzymes No results for  input(s): CKMB, TROPONINI, MYOGLOBIN in the last 168 hours.  Invalid  input(s): CK ------------------------------------------------------------------------------------------------------------------    Component Value Date/Time   BNP 114.0 (H) 05/18/2020 2209     Roxan Hockey M.D on 05/19/2020 at 4:07 PM  Go to [email or web address] - for contact info  [facility] - Office  [phone]

## 2020-05-19 NOTE — ED Notes (Signed)
Hospitalist at bedside 

## 2020-05-20 ENCOUNTER — Inpatient Hospital Stay (HOSPITAL_COMMUNITY): Payer: Medicare HMO

## 2020-05-20 ENCOUNTER — Encounter (HOSPITAL_COMMUNITY): Payer: Self-pay | Admitting: Internal Medicine

## 2020-05-20 LAB — BODY FLUID CELL COUNT WITH DIFFERENTIAL
Lymphs, Fluid: 25 %
Monocyte-Macrophage-Serous Fluid: 70 % (ref 50–90)
Neutrophil Count, Fluid: 5 % (ref 0–25)
Other Cells, Fluid: REACTIVE %
Total Nucleated Cell Count, Fluid: 582 cu mm (ref 0–1000)

## 2020-05-20 LAB — LACTATE DEHYDROGENASE, PLEURAL OR PERITONEAL FLUID: LD, Fluid: 46 U/L — ABNORMAL HIGH (ref 3–23)

## 2020-05-20 LAB — GRAM STAIN

## 2020-05-20 LAB — GLUCOSE, PLEURAL OR PERITONEAL FLUID: Glucose, Fluid: 105 mg/dL

## 2020-05-20 NOTE — Care Management Important Message (Signed)
Important Message  Patient Details  Name: Megan Roberson MRN: [id number] Date of Birth: 06/28/1956   Medicare Important Message Given:  Yes     Tommy Medal 05/20/2020, 11:44 AM

## 2020-05-20 NOTE — Progress Notes (Signed)
Patient Demographics:    Megan Roberson, is a 64 y.o. female, DOB - [birth year], BEM:[id number]  Admit date - 05/18/2020   Admitting Physician Bernadette Hoit, DO  Outpatient Primary MD for the patient is Celene Squibb, MD  LOS - 2   Chief Complaint  Patient presents with  . Cough        Subjective:    [name] today has no fevers, no emesis,  No chest pain,   --- Less short of breath after thoracentesis -Cough persist  Assessment  & Plan :    Principal Problem:   Hypokalemia Active Problems:   Cirrhosis of liver (HCC)   Thrombocytopenia (HCC)   Nausea & vomiting   Prolonged QT interval   Elevated brain natriuretic peptide (BNP) level   Hypoalbuminemia   Cough   Atelectasis   Hypomagnesemia  Brief Summary:-  64 y.o. female who is a reformed alcoholic with medical history significant for history of pancytopenia, cirrhosis of the liver, anemia 05/18/2020 with hypokalemia hypomagnesemia in the setting of diuretic use as well as concerns about possible pneumonia/left-sided lung infection/effusion  A/p 1)Lt Sided CAP--with moderate effusion,  -Status post diagnostic and therapeutic left-sided thoracentesis on 05/20/2020  -Shortness of breath improved  --Continue iv Rocephin and doxycycline,  -Bronchiodilators  and mucolytics as ordered -Echocardiogram was attempted due to dyspnea and pleural effusion--- Echo quality is poor with limited info -Repeat chest x-ray in a.m.  2) hypokalemia/hypomagnesemia--due to diuretic use, patient denies significant GI losses -Replace and recheck  3) alcoholic liver cirrhosis with ascites--- history of esophageal varices and thrombocytopenia - -as per Dr. Havery Moros patient does not meet transplant criteria, also patient was told she is not a good  candidate for TIPS due to history of encephalopathy -be judicious with diuretics due to electrolyte  abnormalities -Ammonia is 37,, INR is 1.9 -Platelets at 33 -Unsuccessful paracentesis on 05/20/2020 due to insufficient amount of ascitic fluid  4) prolonged QT--in the setting of electrolyte abnormalities, avoid other QT prolonging agents, replace electrolytes -Echocardiogram poor quality with limited information   Disposition/Need for in-Hospital Stay- patient unable to be discharged at this time due to --pneumonia requiring IV antibiotics, as well as pleural effusion and ascites requiring diagnostic tap  Status is: Inpatient  Remains inpatient appropriate because:pneumonia requiring IV antibiotics, as well as pleural effusion and ascites requiring diagnostic tap   Disposition: The patient is from: Home              Anticipated d/c is to: Home              Anticipated d/c date is: 2 days              Patient currently is not medically stable to d/c. Barriers: Not Clinically Stable- -pneumonia requiring IV antibiotics, as well as pleural effusion and ascites requiring diagnostic tap  Code Status : full  Procedures:- 05/20/20 --Left-sided thoracentesis with removal of 430 ml of yellow-colored fluid -- -Unsuccessful paracentesis on 05/20/2020 due to insufficient amount of ascitic fluid  Family Communication:    (patient is alert, awake and coherent)    Consults  :  IR for thoracentesis and paracentesis  DVT Prophylaxis  :  TEDs/SCD  Lab Results  Component Value Date  PLT 33 (L) 05/18/2020   Inpatient Medications  Scheduled Meds: . doxycycline  100 mg Oral Q12H  . feeding supplement (ENSURE ENLIVE)  237 mL Oral BID BM  . guaiFENesin  600 mg Oral BID   Continuous Infusions: . cefTRIAXone (ROCEPHIN)  IV 1 g (05/20/20 1631)   PRN Meds:.guaiFENesin-dextromethorphan, prochlorperazine    Anti-infectives (From admission, onward)   Start     Dose/Rate Route Frequency Ordered Stop   05/19/20 2200  doxycycline (VIBRA-TABS) tablet 100 mg     Discontinue     100 mg Oral Every  12 hours 05/19/20 1613     05/19/20 1700  cefTRIAXone (ROCEPHIN) 1 g in sodium chloride 0.9 % 100 mL IVPB     Discontinue     1 g 200 mL/hr over 30 Minutes Intravenous Every 24 hours 05/19/20 1613     05/18/20 2315  levofloxacin (LEVAQUIN) IVPB 750 mg        750 mg 100 mL/hr over 90 Minutes Intravenous  Once 05/18/20 2314 05/19/20 0142        Objective:   Vitals:   05/20/20 0501 05/20/20 1004 05/20/20 1033 05/20/20 1427  BP: 134/74 140/81 117/68 128/78  Pulse: 65 68 72 74  Resp: 16 18 18 18   Temp: 98.5 F (36.9 C)   99.5 F (37.5 C)  TempSrc: Oral   Oral  SpO2: 94% 95% 95% 94%  Weight:      Height:        Wt Readings from Last 3 Encounters:  05/19/20 62.7 kg  12/29/19 65.8 kg  12/01/19 66.4 kg     Intake/Output Summary (Last 24 hours) at 05/20/2020 2005 Last data filed at 05/20/2020 0300 Gross per 24 hour  Intake 100 ml  Output --  Net 100 ml     Physical Exam  Gen:- Awake Alert,  In no apparent distress  HEENT:- [place].AT, No sclera icterus Neck-Supple Neck,No JVD,.  Lungs-improved air movement on the left, no wheezing CV- S1, S2 normal, regular  Abd-  +ve B.Sounds, Abd Soft, No tenderness,  Extremity/Skin:-Trace edema, pedal pulses present  Psych-affect is appropriate, oriented x3 Neuro-generalized weakness, no new focal deficits, no tremors   Data Review:   Micro Results Recent Results (from the past 240 hour(s))  SARS Coronavirus 2 by RT PCR (hospital order, performed in [facility] hospital lab) Nasopharyngeal Nasopharyngeal Swab     Status: None   Collection Time: 05/18/20 11:30 PM   Specimen: Nasopharyngeal Swab  Result Value Ref Range Status   SARS Coronavirus 2 NEGATIVE NEGATIVE Final    Comment: (NOTE) SARS-CoV-2 target nucleic acids are NOT DETECTED.  The SARS-CoV-2 RNA is generally detectable in upper and lower respiratory specimens during the acute phase of infection. The lowest concentration of SARS-CoV-2 viral copies this assay can detect is  250 copies / mL. A negative result does not preclude SARS-CoV-2 infection and should not be used as the sole basis for treatment or other patient management decisions.  A negative result may occur with improper specimen collection / handling, submission of specimen other than nasopharyngeal swab, presence of viral mutation(s) within the areas targeted by this assay, and inadequate number of viral copies (<250 copies / mL). A negative result must be combined with clinical observations, patient history, and epidemiological information.  Fact Sheet for Patients:   StrictlyIdeas.no  Fact Sheet for Healthcare Providers: BankingDealers.co.za  This test is not yet approved or  cleared by the [place] FDA and has been authorized for detection  and/or diagnosis of SARS-CoV-2 by FDA under an Emergency Use Authorization (EUA).  This EUA will remain in effect (meaning this test can be used) for the duration of the COVID-19 declaration under Section 564(b)(1) of the Act, 21 U.S.C. section 360bbb-3(b)(1), unless the authorization is terminated or revoked sooner.  Performed at [facility], [address], [place], [place] [address]   Blood culture (routine x 2)     Status: None (Preliminary result)   Collection Time: 05/19/20 12:00 AM   Specimen: BLOOD RIGHT FOREARM  Result Value Ref Range Status   Specimen Description BLOOD RIGHT FOREARM  Final   Special Requests   Final    BOTTLES DRAWN AEROBIC AND ANAEROBIC Blood Culture adequate volume   Culture   Final    NO GROWTH 1 DAY Performed at [facility], [address], [place], [place] [address]    Report Status PENDING  Incomplete  Blood culture (routine x 2)     Status: None (Preliminary result)   Collection Time: 05/19/20 12:15 AM   Specimen: BLOOD RIGHT ARM  Result Value Ref Range Status   Specimen Description BLOOD RIGHT ARM  Final   Special Requests   Final    BOTTLES DRAWN AEROBIC  AND ANAEROBIC Blood Culture adequate volume   Culture   Final    NO GROWTH 1 DAY Performed at [facility], [address] [place], [place] [address]    Report Status PENDING  Incomplete  Culture, body fluid-bottle     Status: None (Preliminary result)   Collection Time: 05/20/20 10:15 AM   Specimen: Pleura  Result Value Ref Range Status   Specimen Description PLEURAL  Final   Special Requests   Final    BOTTLES DRAWN AEROBIC AND ANAEROBIC 10CC Performed at [facility], [address], [place], [place] [address]    Culture PENDING  Incomplete   Report Status PENDING  Incomplete  Gram stain     Status: None   Collection Time: 05/20/20 10:16 AM   Specimen: Body Fluid  Result Value Ref Range Status   Specimen Description FLUID LEFT CHEST COLLECTED BY DOCTOR  Final   Special Requests NONE  Final   Gram Stain   Final    WBC PRESENT, PREDOMINANTLY MONONUCLEAR NO ORGANISMS SEEN Performed at [facility] Performed at [facility], [address], [place], [place] [address]    Report Status 05/20/2020 FINAL  Final    Radiology Reports DG Chest 1 View  Result Date: 05/20/2020 CLINICAL DATA:  LEFT pleural effusion post thoracentesis EXAM: CHEST  1 VIEW COMPARISON:  05/19/2020 FINDINGS: Normal heart size, mediastinal contours, and pulmonary vascularity. Resolution of previously identified LEFT pleural effusion post thoracentesis. Mild bibasilar atelectasis. No infiltrate or pneumothorax. Osseous structures unremarkable. IMPRESSION: No pneumothorax and resolution of LEFT pleural effusion post thoracentesis. Electronically Signed   By: Lavonia Dana M.D.   On: 05/20/2020 11:04   DG Chest 2 View  Result Date: 05/19/2020 CLINICAL DATA:  Dyspnea. EXAM: CHEST - 2 VIEW COMPARISON:  May 18, 2020 FINDINGS: Stable cardiomediastinal silhouette. No pneumothorax is noted. Moderate size left pleural effusion is noted with underlying atelectasis or infiltrate. Right lung is clear. Bony thorax is  unremarkable. IMPRESSION: Moderate size left pleural effusion with underlying atelectasis or infiltrate. Electronically Signed   By: Marijo Conception M.D.   On: 05/19/2020 15:58   DG Chest Port 1 View  Result Date: 05/18/2020 CLINICAL DATA:  Nonproductive cough for the past 2-3 days with shortness of  breath. EXAM: PORTABLE CHEST 1 VIEW COMPARISON:  None. FINDINGS: Mild, stable diffusely increased lung markings are seen, slightly more prominent within the bilateral lung bases. Moderate severity atelectasis and/or infiltrate is seen within the retrocardiac region of the left lung base. There is no evidence of a pleural effusion or pneumothorax. The heart size and mediastinal contours are within normal limits. Degenerative changes seen throughout the thoracic spine. IMPRESSION: Moderate severity left basilar atelectasis and/or infiltrate. Electronically Signed   By: Virgina Norfolk M.D.   On: 05/18/2020 22:20   ECHOCARDIOGRAM COMPLETE  Result Date: 05/19/2020    ECHOCARDIOGRAM REPORT   Patient Name:   MERIDA ALCANTAR Date of Exam: 05/19/2020 Medical Rec #:  [id number]      Height:       67.0 in Accession #:    [id number]     Weight:       138.2 lb Date of Birth:  [birth year]     BSA:          1.728 m Patient Age:    79 years       BP:           138/85 mmHg Patient Gender: F              HR:           79 bpm. Exam Location:  [facility] Procedure: 2D Echo Indications:    425.9 cardiomyopathy  History:        Patient has no prior history of Echocardiogram examinations.                 Signs/Symptoms:Murmur. Liver disease.  Sonographer:    Jannett Celestine RDCS (AE) Referring Phys: [id number] OLADAPO ADEFESO  Sonographer Comments: Technically difficult study due to poor echo windows, no parasternal window and suboptimal apical window. Image acquisition challenging due to respiratory motion. see comments IMPRESSIONS  1. Extremely poor acoustic windows limit study even with Definity No parasternal window. Overall LVEF is  normal. Not all wall segments visualized. The left ventricle has normal function.  2. Right ventricular systolic function is normal. The right ventricular size is normal.  3. The mitral valve is grossly normal. Trivial mitral valve regurgitation.  4. Extremely poor acoustic windows limit study. FINDINGS  Left Ventricle: Extremely poor acoustic windows limit study even with Definity No parasternal window. Overall LVEF is normal. Not all wall segments visualized. Left ventricular ejection fraction, by estimation, is See note%. The left ventricle has normal function. The left ventricle has no regional wall motion abnormalities. Definity contrast agent was given IV to delineate the left ventricular endocardial borders. The left ventricular internal cavity size was normal in size. There is not measureed. left ventricular hypertrophy. Left ventricular diastolic function could not be evaluated. Right Ventricle: The right ventricular size is normal. Right vetricular wall thickness was not assessed. Right ventricular systolic function is normal. Left Atrium: Left atrial size was normal in size. Right Atrium: Right atrial size was normal in size. Pericardium: There is no evidence of pericardial effusion. Mitral Valve: The mitral valve is grossly normal. Trivial mitral valve regurgitation. Tricuspid Valve: The tricuspid valve is grossly normal. Tricuspid valve regurgitation is trivial. Aortic Valve: The aortic valve was not well visualized. Aortic valve regurgitation is not visualized. Pulmonic Valve: The pulmonic valve was not assessed. Pulmonic valve regurgitation not assessed. Aorta: The aortic root was not well visualized. Venous: The inferior vena cava is normal in size with greater than 50% respiratory variability, suggesting right atrial pressure  of 3 mmHg. IAS/Shunts: No atrial level shunt detected by color flow Doppler.   Diastology LV e' lateral:   12.50 cm/s LV E/e' lateral: 6.7 LV e' medial:    11.60 cm/s LV E/e'  medial:  7.2  MITRAL VALVE MV Area (PHT): 2.75 cm MV Decel Time: 276 msec MV E velocity: 84.00 cm/s MV A velocity: 62.60 cm/s MV E/A ratio:  1.34 Dorris Carnes MD Electronically signed by Dorris Carnes MD Signature Date/Time: 05/19/2020/3:58:04 PM    Final    [place] ASCITES (ABDOMEN LIMITED)  Result Date: 05/20/2020 CLINICAL DATA:  Ascites, question sufficient for paracentesis EXAM: LIMITED ABDOMEN ULTRASOUND FOR ASCITES TECHNIQUE: Limited ultrasound survey for ascites was performed in all four abdominal quadrants. COMPARISON:  Ultrasound abdomen 01/09/2020 FINDINGS: Scant ascites scattered throughout abdomen. Volume of ascites is insufficient for paracentesis. Insufficient ascites for paracentesis. Electronically Signed   By: Lavonia Dana M.D.   On: 05/20/2020 11:10   US THORACENTESIS ASP PLEURAL SPACE W/IMG GUIDE  Result Date: 05/20/2020 INDICATION: LEFT pleural effusion, thoracentesis EXAM: ULTRASOUND GUIDED DIAGNOSTIC AND THERAPEUTIC THORACENTESIS MEDICATIONS: None. COMPLICATIONS: None immediate. PROCEDURE: An ultrasound guided thoracentesis was thoroughly discussed with the patient and questions answered. The benefits, risks, alternatives and complications were also discussed. The patient understands and wishes to proceed with the procedure. Written consent was obtained. Ultrasound was performed to localize and mark an adequate pocket of fluid in the LEFT chest. The area was then prepped and draped in the normal sterile fashion. 1% Lidocaine was used for local anesthesia. Under ultrasound guidance a 8 French thoracentesis catheter was introduced. Thoracentesis was performed. The catheter was removed and a dressing applied. FINDINGS: A total of approximately 430 mL of clear yellow LEFT pleural fluid was removed. Samples were sent to the laboratory as requested by the clinical team. IMPRESSION: Successful ultrasound guided LEFT thoracentesis yielding 430 mL of pleural fluid. Electronically Signed   By: Lavonia Dana  M.D.   On: 05/20/2020 11:09     CBC Recent Labs  Lab 05/18/20 2209  WBC 4.4  HGB 11.7*  HCT 36.9  PLT 33*  MCV 82.7  MCH 26.2  MCHC 31.7  RDW 19.7*  LYMPHSABS 0.8  MONOABS 0.6  EOSABS 0.1  BASOSABS 0.0    Chemistries  Recent Labs  Lab 05/18/20 2209 05/19/20 0000 05/19/20 0529  NA 140  --  136  K 2.5*  --  3.2*  CL 99  --  101  CO2 31  --  29  GLUCOSE 93  --  80  BUN 5*  --  6*  CREATININE 0.69  --  0.60  CALCIUM 8.2*  --  7.5*  MG  --  1.6* 2.0  AST 51*  --  42*  ALT 20  --  18  ALKPHOS 113  --  84  BILITOT 3.8*  --  2.7*   ------------------------------------------------------------------------------------------------------------------ No results for input(s): CHOL, HDL, LDLCALC, TRIG, CHOLHDL, LDLDIRECT in the last 72 hours.  No results found for: HGBA1C ------------------------------------------------------------------------------------------------------------------ No results for input(s): TSH, T4TOTAL, T3FREE, THYROIDAB in the last 72 hours.  Invalid input(s): FREET3 ------------------------------------------------------------------------------------------------------------------ No results for input(s): VITAMINB12, FOLATE, FERRITIN, TIBC, IRON, RETICCTPCT in the last 72 hours.  Coagulation profile Recent Labs  Lab 05/19/20 0529  INR 1.9*    No results for input(s): DDIMER in the last 72 hours.  Cardiac Enzymes No results for input(s): CKMB, TROPONINI, MYOGLOBIN in the last 168 hours.  Invalid input(s): CK ------------------------------------------------------------------------------------------------------------------    Component Value Date/Time  BNP 114.0 (H) 05/18/2020 2209     Roxan Hockey M.D on 05/20/2020 at 8:05 PM  Go to [email or web address] - for contact info  [facility] - Office  [phone]

## 2020-05-20 NOTE — Procedures (Signed)
PreOperative Dx: LT pleural effusion Postoperative Dx: LT pleural effusion Procedure:   US guided LT thoracentesis Radiologist:  Thornton Papas Anesthesia:  10 ml of 1% lidocaine Specimen:  430 mL of clear yellow colored fluid EBL:   < 1 ml Complications: None

## 2020-05-21 ENCOUNTER — Inpatient Hospital Stay (HOSPITAL_COMMUNITY): Payer: Medicare HMO

## 2020-05-21 LAB — CBC
HCT: 31.1 % — ABNORMAL LOW (ref 36.0–46.0)
Hemoglobin: 9.7 g/dL — ABNORMAL LOW (ref 12.0–15.0)
MCH: 26.7 pg (ref 26.0–34.0)
MCHC: 31.2 g/dL (ref 30.0–36.0)
MCV: 85.7 fL (ref 80.0–100.0)
Platelets: 24 10*3/uL — CL (ref 150–400)
RBC: 3.63 MIL/uL — ABNORMAL LOW (ref 3.87–5.11)
RDW: 20.4 % — ABNORMAL HIGH (ref 11.5–15.5)
WBC: 3.2 10*3/uL — ABNORMAL LOW (ref 4.0–10.5)
nRBC: 0 % (ref 0.0–0.2)

## 2020-05-21 LAB — COMPREHENSIVE METABOLIC PANEL
ALT: 18 U/L (ref 0–44)
AST: 53 U/L — ABNORMAL HIGH (ref 15–41)
Albumin: 2.4 g/dL — ABNORMAL LOW (ref 3.5–5.0)
Alkaline Phosphatase: 83 U/L (ref 38–126)
Anion gap: 7 (ref 5–15)
BUN: 10 mg/dL (ref 8–23)
CO2: 26 mmol/L (ref 22–32)
Calcium: 8.3 mg/dL — ABNORMAL LOW (ref 8.9–10.3)
Chloride: 107 mmol/L (ref 98–111)
Creatinine, Ser: 0.66 mg/dL (ref 0.44–1.00)
GFR calc Af Amer: >60 mL/min (ref >60)
GFR calc non Af Amer: >60 mL/min (ref >60)
Glucose, Bld: 93 mg/dL (ref 70–99)
Potassium: 3.4 mmol/L — ABNORMAL LOW (ref 3.5–5.1)
Sodium: 140 mmol/L (ref 135–145)
Total Bilirubin: 2.1 mg/dL — ABNORMAL HIGH (ref 0.3–1.2)
Total Protein: 5.1 g/dL — ABNORMAL LOW (ref 6.5–8.1)

## 2020-05-21 LAB — CYTOLOGY - NON PAP

## 2020-05-21 LAB — PH, BODY FLUID: pH, Body Fluid: 7.4

## 2020-05-21 LAB — MAGNESIUM: Magnesium: 1.7 mg/dL (ref 1.7–2.4)

## 2020-05-21 MED ORDER — OXYCODONE HCL 5 MG PO TABS: 5.0000 mg | ORAL_TABLET | Freq: Two times a day (BID) | ORAL | 0 refills | Status: DC | PRN

## 2020-05-21 MED ORDER — SPIRONOLACTONE 100 MG PO TABS: 150.0000 mg | ORAL_TABLET | Freq: Every day | ORAL | 5 refills | Status: DC

## 2020-05-21 MED ORDER — POTASSIUM CHLORIDE ER 20 MEQ PO TBCR: 20.0000 meq | EXTENDED_RELEASE_TABLET | ORAL | 1 refills | Status: DC

## 2020-05-21 MED ORDER — SODIUM CHLORIDE 0.9 % IV SOLN: INTRAVENOUS | Status: DC

## 2020-05-21 MED ORDER — POTASSIUM CHLORIDE CRYS ER 20 MEQ PO TBCR
40.0000 meq | EXTENDED_RELEASE_TABLET | ORAL | Status: AC
  Administered 2020-05-21 (×2): 40 meq via ORAL
  Filled 2020-05-21 (×2): qty 2

## 2020-05-21 MED ORDER — MAGNESIUM SULFATE 2 GM/50ML IV SOLN
2.0000 g | Freq: Once | INTRAVENOUS | Status: AC
  Administered 2020-05-21: 2 g via INTRAVENOUS
  Filled 2020-05-21: qty 50

## 2020-05-21 MED ORDER — POTASSIUM CHLORIDE CRYS ER 20 MEQ PO TBCR: 40.0000 meq | EXTENDED_RELEASE_TABLET | ORAL | Status: DC

## 2020-05-21 MED ORDER — RIFAXIMIN 550 MG PO TABS: 550.0000 mg | ORAL_TABLET | Freq: Two times a day (BID) | ORAL | 5 refills | Status: DC

## 2020-05-21 MED ORDER — FUROSEMIDE 80 MG PO TABS: 40.0000 mg | ORAL_TABLET | Freq: Every day | ORAL | 5 refills | Status: DC

## 2020-05-21 MED ORDER — OXYCODONE HCL 5 MG PO TABS
5.0000 mg | ORAL_TABLET | ORAL | Status: DC | PRN
  Administered 2020-05-21 (×2): 5 mg via ORAL
  Filled 2020-05-21 (×2): qty 1

## 2020-05-21 NOTE — Progress Notes (Signed)
Discharge instructions given to patient. Given copy of AVS. States will call to schedule follow-up with PCP for labs as instructed. Preferred to schedule herself. Verbalized understanding of instructions. IV site removed by NT, site within normal limits. Patient in stable condition awaiting family arrival for discharge home.

## 2020-05-21 NOTE — Discharge Summary (Signed)
Megan Roberson, is a 64 y.o. female  DOB 1955-11-26  MRN [id number].  Admission date:  05/18/2020  Admitting Physician  Bernadette Hoit, DO  Discharge Date:  05/21/2020   Primary MD  Celene Squibb, MD  Recommendations for primary care physician for things to follow:   1)Avoid ibuprofen/Advil/Aleve/Motrin/Goody Powders/Naproxen/BC powders/Meloxicam/Diclofenac/Indomethacin and other Nonsteroidal anti-inflammatory medications as these will make you more likely to bleed and can cause stomach ulcers, can also cause Kidney problems.   2)Repeat CBC and CMP with the primary care physician every week for the next 4 weeks starting Monday, May 27, 2020 due to low platelet count and potential for electrolyte abnormalities while on diuretics  Admission Diagnosis  Hypokalemia [E87.6] Community acquired pneumonia, unspecified laterality [J18.9]   Discharge Diagnosis  Hypokalemia [E87.6] Community acquired pneumonia, unspecified laterality [J18.9]    Principal Problem:   Hypokalemia Active Problems:   Cirrhosis of liver (HCC)   Thrombocytopenia (HCC)   Nausea & vomiting   Prolonged QT interval   Elevated brain natriuretic peptide (BNP) level   Hypoalbuminemia   Cough   Atelectasis   Hypomagnesemia      Past Medical History:  Diagnosis Date  . Anxiety    pt. denies  . Arthritis   . Cirrhosis of liver ([place])   . Complication of anesthesia    ileus after surgery 2018  . Depression    "not all the time"  . Heart murmur    Echo 10/06/17 [facility]): Normal LV/RV size, basal septal hypertophy-normal variant, LVEF 65-70%, normal LV/RV function, est RAP 5 mmHg, no sign valvular stenosis or regurg--trace MR/TR/PR  . History of abdominal paracentesis   . History of blood transfusion   . Hypothyroidism   . Ileus ([place])   . Liver disease 2008  . Neuropathy   . Osteoporosis 02/09/2018  . Pancytopenia ([place])   .  Pneumonia    2019ish  . Thrombocytopenia ([place]) 12/01/2019  . Vaginal Pap smear, abnormal     Past Surgical History:  Procedure Laterality Date  . AUGMENTATION MAMMAPLASTY Bilateral    20 years ago  . BIOPSY  03/15/2018   Procedure: BIOPSY;  Surgeon: Yetta Flock, MD;  Location: [facility];  Service: Gastroenterology;;  Gastric  . COLONOSCOPY WITH ESOPHAGOGASTRODUODENOSCOPY (EGD) AND ESOPHAGEAL DILATION (ED)    . COLONOSCOPY WITH PROPOFOL N/A 03/15/2018   Procedure: COLONOSCOPY WITH PROPOFOL;  Surgeon: Yetta Flock, MD;  Location: [facility];  Service: Gastroenterology;  Laterality: N/A;  . ESOPHAGOGASTRODUODENOSCOPY (EGD) WITH PROPOFOL N/A 03/15/2018   Procedure: ESOPHAGOGASTRODUODENOSCOPY (EGD) WITH PROPOFOL;  Surgeon: Yetta Flock, MD;  Location: [facility];  Service: Gastroenterology;  Laterality: N/A;  . ESOPHAGOGASTRODUODENOSCOPY W/ BANDING     Varices  . FRACTURE SURGERY Left    x 2 L elbow and hip left   . HARDWARE REMOVAL Left 09/17/2017   Procedure: HARDWARE REMOVAL LEFT OLECRANON;  Surgeon: Altamese Beaver, MD;  Location: [place];  Service: Orthopedics;  Laterality: Left;  . HIP SURGERY Left    from fracture-  rod in palce  . IR PARACENTESIS  11/18/2017  . IR PARACENTESIS  11/26/2017  . IR PARACENTESIS  03/14/2018  . KNEE SURGERY Right    open incision for ligaments  . ORIF ELBOW FRACTURE Left 04/30/2017   Procedure: REVISION OPEN REDUCTION INTERNAL FIXATION (ORIF) ELBOW/OLECRANON FRACTURE;  Surgeon: Altamese [place]onville, MD;  Location: [place];  Service: Orthopedics;  Laterality: Left;  . REPAIR EXTENSOR TENDON Left 12/29/2019   Procedure: LEFT LONG AND LEFT RING FRINGER EXTENSOR REALIGNAMENT WITH POSSIBLE TENDON TRANSFER;  Surgeon: Charlotte Crumb, MD;  Location: [place];  Service: Orthopedics;  Laterality: Left;     HPI  from the history and physical done on the day of admission:    Chief Complaint: Cough  HPI: Megan Roberson is a 64 y.o. female with  medical history significant for history of pancytopenia, cirrhosis of the liver, anemia, and multiple fractures whopresents to the Emergency Department due to complaint of several days of onset of nonproductive cough of moderate intensity which has gradually worsened with occasional tenderness in the midsternal area when she coughs. She believes the cough may be related to slight increase in abdomen which she believes to be related to fluid buildup, she states that she had similar presentation in the past which resulted in fluid building up around the lungs. She complains of chronic intermittent shortness of breath that is not related to exertion. She states that she had vomited a brown liquid x1 about 2 days ago and she does complain of ongoing nausea. Patient states that last paracentesis was about a year ago. She denies fever, chills, any worsening shortness of breath  ED Course:  In the emergency department, she was hemodynamically stable, O2 sat was 96% on room air. Work-up in the ED showed thrombocytopenia (chronic), hypokalemia, BNP on room 14, hypoalbuminemia. Chest x-ray showed moderate severity of left basilar atelectasis and/or infiltrate. She was treated with KCl, magnesium and IV Levaquin 76m x 1. Hospitalist was asked to admit. For further evaluation and management.   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, cirrhosis of the liver, anemia 05/18/2020 with hypokalemia hypomagnesemia in the setting of diuretic use as well as concerns about possible pneumonia/left-sided lung infection/effusion  A/p 1)Lt Sided CAP--with moderate effusion,  -Status post diagnostic and therapeutic left-sided thoracentesis on 05/20/2020  -Fluid Gram stain and culture NGTD, cytology pending -Shortness of breath improved  --Treated with iv Rocephin and doxycycline for presumed pneumonia, repeat chest x-ray x2  after thoracentesis without definite pneumonia okay to discontinue antibiotics,  -Bronchiodilators  and mucolytics as ordered -Echocardiogram was attempted due to dyspnea and pleural effusion--- Echo quality is poor with limited info  2) hypokalemia/hypomagnesemia--due to diuretic use, patient denies significant GI losses -Replaced and rechecked  3) alcoholic liver cirrhosis with ascites--- history of esophageal varices and thrombocytopenia - -as per Dr. AHavery Morospatient does not meet transplant criteria, also patient was told she is not a good  candidate for TIPS due to history of encephalopathy -be judicious with diuretics due to electrolyte abnormalities -Ammonia is 37,, INR is 1.9 -Platelets at 24 \-Patient has pancytopenia -Unsuccessful paracentesis on 05/20/2020 due to insufficient amount of ascitic fluid  4) prolonged QT--in the setting of electrolyte abnormalities, avoid other QT prolonging agents, replace electrolytes -Echocardiogram poor quality with limited information   Disposition--discharge home   Disposition: The patient is from: Home  Anticipated d/c is to: Home    Code Status : full  Procedures:- 05/20/20 --Left-sided thoracentesis with removal of 430 ml of yellow-colored fluid -- -Unsuccessful paracentesis on 05/20/2020 due to insufficient amount of ascitic fluid  Family Communication:    (patient is alert, awake and coherent)    Consults  :  IR for thoracentesis and paracentesis   Discharge Condition: Stable without hypoxia  Follow UP   Follow-up Information    Celene Squibb, MD Follow up in 2 week(s).   Specialty: Internal Medicine Why: Weekly CBC and CMP every Monday starting 05/27/2020 for 4 weeks Contact information: [place] [address] [phone]                Diet and Activity recommendation:  As advised  Discharge Instructions    Discharge Instructions    Call MD for:   difficulty breathing, headache or visual disturbances   Complete by: As directed    Call MD for:  persistant dizziness or light-headedness   Complete by: As directed    Call MD for:  persistant nausea and vomiting   Complete by: As directed    Call MD for:  severe uncontrolled pain   Complete by: As directed    Call MD for:  temperature >100.4   Complete by: As directed    Diet - low sodium heart healthy   Complete by: As directed    Discharge instructions   Complete by: As directed    1)Avoid ibuprofen/Advil/Aleve/Motrin/Goody Powders/Naproxen/BC powders/Meloxicam/Diclofenac/Indomethacin and other Nonsteroidal anti-inflammatory medications as these will make you more likely to bleed and can cause stomach ulcers, can also cause Kidney problems.   2)Repeat CBC and BMP with the primary care physician every week for the next 4 weeks starting Monday, May 27, 2020 due to low platelet count and potential for electrolyte abnormalities while on diuretics   Increase activity slowly   Complete by: As directed         Discharge Medications     Allergies as of 05/21/2020      Reactions   Penicillins Swelling, Other (See Comments)   FACIAL SWELLING Did it involve swelling of the face/tongue/throat, SOB, or low BP? Yes Did it involve sudden or severe rash/hives, skin peeling, or any reaction on the inside of your mouth or nose? No Did you need to seek medical attention at a hospital or doctor's office? Unknown When did it last happen?30 years If all above answers are "NO", may proceed with cephalosporin use.   Tylenol [acetaminophen] Nausea Only   Sulfa Antibiotics Itching, Rash   Tramadol Swelling, Rash      Medication List    TAKE these medications   denosumab 60 MG/ML Sosy injection Commonly known as: PROLIA Inject 60 mg into the skin every 6 (six) months.   diclofenac Sodium 1 % Gel Commonly known as: VOLTAREN SMARTSIG:2-4 Gram(s) Topical 1 to 4 Times Daily   furosemide  80 MG tablet Commonly known as: LASIX Take 0.5 tablets (40 mg total) by mouth daily.   levOCARNitine 330 MG tablet Commonly known as: CARNITOR Take 330 mg by mouth 3 (three) times daily.   levothyroxine 75 MCG tablet Commonly known as: SYNTHROID Take 1 tablet (75 mcg total) by mouth daily before breakfast.   oxyCODONE 5 MG immediate release tablet Commonly known as: Oxy IR/ROXICODONE Take 1 tablet (5 mg total) by mouth every 12 (twelve) hours as needed for severe pain. What changed:   when to take this  reasons to take this   Potassium Chloride ER 20 MEQ  Tbcr Take 20 mEq by mouth every Tuesday, Thursday, Saturday, and Sunday.   rifaximin 550 MG Tabs tablet Commonly known as: XIFAXAN Take 1 tablet (550 mg total) by mouth 2 (two) times daily.   spironolactone 100 MG tablet Commonly known as: ALDACTONE Take 1.5 tablets (150 mg total) by mouth daily.   traZODone 50 MG tablet Commonly known as: DESYREL Take 50 mg by mouth at bedtime as needed for sleep.      Major procedures and Radiology Reports - PLEASE review detailed and final reports for all details, in brief -    DG Chest 1 View  Result Date: 05/20/2020 CLINICAL DATA:  LEFT pleural effusion post thoracentesis EXAM: CHEST  1 VIEW COMPARISON:  05/19/2020 FINDINGS: Normal heart size, mediastinal contours, and pulmonary vascularity. Resolution of previously identified LEFT pleural effusion post thoracentesis. Mild bibasilar atelectasis. No infiltrate or pneumothorax. Osseous structures unremarkable. IMPRESSION: No pneumothorax and resolution of LEFT pleural effusion post thoracentesis. Electronically Signed   By: Mark  Boles M.D.   On: 05/20/2020 11:04   DG Chest 2 View  Result Date: 05/21/2020 CLINICAL DATA:  History of recent left thoracentesis, worsening shortness of breath. EXAM: CHEST - 2 VIEW COMPARISON:  05/20/2020 FINDINGS: Cardiac shadow is stable. Mild aortic calcifications are again noted. The lungs are well  aerated bilaterally. Residual left-sided pleural effusion is noted posteriorly. No pneumothorax is noted. No focal infiltrate is seen. No bony abnormality is noted. IMPRESSION: Small residual left pleural effusion following thoracentesis. No pneumothorax is noted. Electronically Signed   By: Mark  Lukens M.D.   On: 05/21/2020 08:20   DG Chest 2 View  Result Date: 05/19/2020 CLINICAL DATA:  Dyspnea. EXAM: CHEST - 2 VIEW COMPARISON:  May 18, 2020 FINDINGS: Stable cardiomediastinal silhouette. No pneumothorax is noted. Moderate size left pleural effusion is noted with underlying atelectasis or infiltrate. Right lung is clear. Bony thorax is unremarkable. IMPRESSION: Moderate size left pleural effusion with underlying atelectasis or infiltrate. Electronically Signed   By: James  Green Jr M.D.   On: 05/19/2020 15:58   DG Chest Port 1 View  Result Date: 05/18/2020 CLINICAL DATA:  Nonproductive cough for the past 2-3 days with shortness of breath. EXAM: PORTABLE CHEST 1 VIEW COMPARISON:  None. FINDINGS: Mild, stable diffusely increased lung markings are seen, slightly more prominent within the bilateral lung bases. Moderate severity atelectasis and/or infiltrate is seen within the retrocardiac region of the left lung base. There is no evidence of a pleural effusion or pneumothorax. The heart size and mediastinal contours are within normal limits. Degenerative changes seen throughout the thoracic spine. IMPRESSION: Moderate severity left basilar atelectasis and/or infiltrate. Electronically Signed   By: Thaddeus  Houston M.D.   On: 05/18/2020 22:20   ECHOCARDIOGRAM COMPLETE  Result Date: 05/19/2020    ECHOCARDIOGRAM REPORT   Patient Name:   Crystel Kluver Date of Exam: 05/19/2020 Medical Rec #:  [id number]      Height:       67.0 in Accession #:    [id number]     Weight:       138.2 lb Date of Birth:  09/07/1956     BSA:          1.728 m Patient Age:    63 years       BP:           13 8/85 mmHg Patient Gender: F               HR:  79 bpm. Exam Location:  [facility] Procedure: 2D Echo Indications:    425.9 cardiomyopathy  History:        Patient has no prior history of Echocardiogram examinations.                 Signs/Symptoms:Murmur. Liver disease.  Sonographer:    Jannett Celestine RDCS (AE) Referring Phys: [id number] OLADAPO ADEFESO  Sonographer Comments: Technically difficult study due to poor echo windows, no parasternal window and suboptimal apical window. Image acquisition challenging due to respiratory motion. see comments IMPRESSIONS  1. Extremely poor acoustic windows limit study even with Definity No parasternal window. Overall LVEF is normal. Not all wall segments visualized. The left ventricle has normal function.  2. Right ventricular systolic function is normal. The right ventricular size is normal.  3. The mitral valve is grossly normal. Trivial mitral valve regurgitation.  4. Extremely poor acoustic windows limit study. FINDINGS  Left Ventricle: Extremely poor acoustic windows limit study even with Definity No parasternal window. Overall LVEF is normal. Not all wall segments visualized. Left ventricular ejection fraction, by estimation, is See note%. The left ventricle has normal function. The left ventricle has no regional wall motion abnormalities. Definity contrast agent was given IV to delineate the left ventricular endocardial borders. The left ventricular internal cavity size was normal in size. There is not measureed. left ventricular hypertrophy. Left ventricular diastolic function could not be evaluated. Right Ventricle: The right ventricular size is normal. Right vetricular wall thickness was not assessed. Right ventricular systolic function is normal. Left Atrium: Left atrial size was normal in size. Right Atrium: Right atrial size was normal in size. Pericardium: There is no evidence of pericardial effusion. Mitral Valve: The mitral valve is grossly normal. Trivial mitral valve regurgitation.  Tricuspid Valve: The tricuspid valve is grossly normal. Tricuspid valve regurgitation is trivial. Aortic Valve: The aortic valve was not well visualized. Aortic valve regurgitation is not visualized. Pulmonic Valve: The pulmonic valve was not assessed. Pulmonic valve regurgitation not assessed. Aorta: The aortic root was not well visualized. Venous: The inferior vena cava is normal in size with greater than 50% respiratory variability, suggesting right atrial pressure of 3 mmHg. IAS/Shunts: No atrial level shunt detected by color flow Doppler.   Diastology LV e' lateral:   12.50 cm/s LV E/e' lateral: 6.7 LV e' medial:    11.60 cm/s LV E/e' medial:  7.2  MITRAL VALVE MV Area (PHT): 2.75 cm MV Decel Time: 276 msec MV E velocity: 84.00 cm/s MV A velocity: 62.60 cm/s MV E/A ratio:  1.34 Dorris Carnes MD Electronically signed by Dorris Carnes MD Signature Date/Time: 05/19/2020/3:58:04 PM    Final    [place] ASCITES (ABDOMEN LIMITED)  Result Date: 05/20/2020 CLINICAL DATA:  Ascites, question sufficient for paracentesis EXAM: LIMITED ABDOMEN ULTRASOUND FOR ASCITES TECHNIQUE: Limited ultrasound survey for ascites was performed in all four abdominal quadrants. COMPARISON:  Ultrasound abdomen 01/09/2020 FINDINGS: Scant ascites scattered throughout abdomen. Volume of ascites is insufficient for paracentesis. Insufficient ascites for paracentesis. Electronically Signed   By: Lavonia Dana M.D.   On: 05/20/2020 11:10   US THORACENTESIS ASP PLEURAL SPACE W/IMG GUIDE  Result Date: 05/20/2020 INDICATION: LEFT pleural effusion, thoracentesis EXAM: ULTRASOUND GUIDED DIAGNOSTIC AND THERAPEUTIC THORACENTESIS MEDICATIONS: None. COMPLICATIONS: None immediate. PROCEDURE: An ultrasound guided thoracentesis was thoroughly discussed with the patient and questions answered. The benefits, risks, alternatives and complications were also discussed. The patient understands and wishes to proceed with the procedure. Written consent was obtained.  Ultrasound  was performed to localize and mark an adequate pocket of fluid in the LEFT chest. The area was then prepped and draped in the normal sterile fashion. 1% Lidocaine was used for local anesthesia. Under ultrasound guidance a 8 French thoracentesis catheter was introduced. Thoracentesis was performed. The catheter was removed and a dressing applied. FINDINGS: A total of approximately 430 mL of clear yellow LEFT pleural fluid was removed. Samples were sent to the laboratory as requested by the clinical team. IMPRESSION: Successful ultrasound guided LEFT thoracentesis yielding 430 mL of pleural fluid. Electronically Signed   By: Lavonia Dana M.D.   On: 05/20/2020 11:09    Micro Results   Recent Results (from the past 240 hour(s))  SARS Coronavirus 2 by RT PCR (hospital order, performed in [facility] hospital lab) Nasopharyngeal Nasopharyngeal Swab     Status: None   Collection Time: 05/18/20 11:30 PM   Specimen: Nasopharyngeal Swab  Result Value Ref Range Status   SARS Coronavirus 2 NEGATIVE NEGATIVE Final    Comment: (NOTE) SARS-CoV-2 target nucleic acids are NOT DETECTED.  The SARS-CoV-2 RNA is generally detectable in upper and lower respiratory specimens during the acute phase of infection. The lowest concentration of SARS-CoV-2 viral copies this assay can detect is 250 copies / mL. A negative result does not preclude SARS-CoV-2 infection and should not be used as the sole basis for treatment or other patient management decisions.  A negative result may occur with improper specimen collection / handling, submission of specimen other than nasopharyngeal swab, presence of viral mutation(s) within the areas targeted by this assay, and inadequate number of viral copies (<250 copies / mL). A negative result must be combined with clinical observations, patient history, and epidemiological information.  Fact Sheet for Patients:   StrictlyIdeas.no  Fact Sheet for  Healthcare Providers: BankingDealers.co.za  This test is not yet approved or  cleared by the [place] FDA and has been authorized for detection and/or diagnosis of SARS-CoV-2 by FDA under an Emergency Use Authorization (EUA).  This EUA will remain in effect (meaning this test can be used) for the duration of the COVID-19 declaration under Section 564(b)(1) of the Act, 21 U.S.C. section 360bbb-3(b)(1), unless the authorization is terminated or revoked sooner.  Performed at [facility], [address] [place], [place] [address]   Blood culture (routine x 2)     Status: None (Preliminary result)   Collection Time: 05/19/20 12:00 AM   Specimen: BLOOD RIGHT FOREARM  Result Value Ref Range Status   Specimen Description BLOOD RIGHT FOREARM  Final   Special Requests   Final    BOTTLES DRAWN AEROBIC AND ANAEROBIC Blood Culture adequate volume   Culture   Final    NO GROWTH 2 DAYS Performed at [facility], [address], [place], [place] [address]    Report Status PENDING  Incomplete  Blood culture (routine x 2)     Status: None (Preliminary result)   Collection Time: 05/19/20 12:15 AM   Specimen: BLOOD RIGHT ARM  Result Value Ref Range Status   Specimen Description BLOOD RIGHT ARM  Final   Special Requests   Final    BOTTLES DRAWN AEROBIC AND ANAEROBIC Blood Culture adequate volume   Culture   Final    NO GROWTH 2 DAYS Performed at [facility], [address], [place], [place] [address]    Report Status PENDING  Incomplete  Culture, body fluid-bottle     Status: None (Preliminary result)   Collection Time: 05/20/20  10:15 AM   Specimen: Fluid  Result Value Ref Range Status   Specimen Description FLUID LEFT CHEST COLLECTED BY DOCTOR  Final   Special Requests BOTTLES DRAWN AEROBIC AND ANAEROBIC 10CC  Final   Culture   Final    NO GROWTH < 24 HOURS Performed at [facility], [address], [place], [place] [address]    Report Status PENDING  Incomplete   Gram stain     Status: None   Collection Time: 05/20/20 10:16 AM   Specimen: Body Fluid  Result Value Ref Range Status   Specimen Description FLUID LEFT CHEST COLLECTED BY DOCTOR  Final   Special Requests NONE  Final   Gram Stain   Final    WBC PRESENT, PREDOMINANTLY MONONUCLEAR NO ORGANISMS SEEN Performed at [facility] Performed at [facility], [address], [place], [place] [address]    Report Status 05/20/2020 FINAL  Final       Today   Subjective    Amberley Hamler today has no new complaints -Left-sided chest wall pain resolved -No productive cough, no fever no chills no vomiting or diarrhea          Patient has been seen and examined prior to discharge   Objective   Blood pressure 127/76, pulse 71, temperature 98.9 F (37.2 C), temperature source Oral, resp. rate 20, height 5' 7"  (1.702 m), weight 62.7 kg, SpO2 95 %.  No intake or output data in the 24 hours ending 05/21/20 1313  Exam Gen:- Awake Alert, no acute distress  HEENT:- [place].AT, No sclera icterus Neck-Supple Neck,No JVD,.  Lungs-  CTAB , much improved air movement on the left after thoracentesis CV- S1, S2 normal, regular Abd-  +ve B.Sounds, Abd Soft, No tenderness, no significant distention   at this time Extremity/Skin:- No  edema,   good pulses Psych-affect is appropriate, oriented x3 Neuro-no new focal deficits, no tremors    Data Review   CBC w Diff:  Lab Results  Component Value Date   WBC 3.2 (L) 05/21/2020   HGB 9.7 (L) 05/21/2020   HCT 31.1 (L) 05/21/2020   PLT 24 (LL) 05/21/2020   LYMPHOPCT 19 05/18/2020   MONOPCT 14 05/18/2020   EOSPCT 3 05/18/2020   BASOPCT 1 05/18/2020    CMP:  Lab Results  Component Value Date   NA 140 05/21/2020   K 3.4 (L) 05/21/2020   CL 107 05/21/2020   CO2 26 05/21/2020   BUN 10 05/21/2020   CREATININE 0.66 05/21/2020   PROT 5.1 (L) 05/21/2020   ALBUMIN 2.4 (L) 05/21/2020   BILITOT 2.1 (H) 05/21/2020   ALKPHOS 83 05/21/2020   AST 53  (H) 05/21/2020   ALT 18 05/21/2020  .   Total Discharge time is about 33 minutes  Roxan Hockey M.D on 05/21/2020 at 1:13 PM  Go to [email or web address] -  for contact info  [facility] - Office  [phone]

## 2020-05-21 NOTE — Plan of Care (Signed)

## 2020-05-21 NOTE — Discharge Instructions (Signed)
1)Avoid ibuprofen/Advil/Aleve/Motrin/Goody Powders/Naproxen/BC powders/Meloxicam/Diclofenac/Indomethacin and other Nonsteroidal anti-inflammatory medications as these will make you more likely to bleed and can cause stomach ulcers, can also cause Kidney problems.   2)Repeat CBC and CMP with the primary care physician every week for the next 4 weeks starting Monday, May 27, 2020 due to low platelet count and potential for electrolyte abnormalities while on diuretics

## 2020-05-21 NOTE — Progress Notes (Signed)
CRITICAL VALUE ALERT  Critical Value:  24 platelets  Date & Time Notfied: 05/21/20 0620  Provider Notified: dr Josephine Cables  Orders Received/Actions taken: no new orders

## 2020-05-23 ENCOUNTER — Other Ambulatory Visit: Payer: Self-pay

## 2020-05-23 NOTE — Patient Outreach (Signed)
[facility]) Care Management  05/23/2020  Megan Roberson [id number]   Referral Date: 05/23/20 Referral Source: [employer] Report Date of Discharge: 05/21/20 Facility: [place]   Referral received.  No outreach warranted at this time.  Transition of Care calls being completed via EMMI. RN CM will outreach patient for any red flags received.    Plan: RN CM will close case.    Jone Baseman, RN, MSN [facility] Care Management Care Management Coordinator Direct Line [phone] Toll Free: [phone]  Fax: [phone]

## 2020-05-24 ENCOUNTER — Ambulatory Visit: Payer: Medicare HMO | Admitting: Nurse Practitioner

## 2020-05-24 DIAGNOSIS — M25562 Pain in left knee: Secondary | ICD-10-CM | POA: Diagnosis not present

## 2020-05-24 DIAGNOSIS — M25561 Pain in right knee: Secondary | ICD-10-CM | POA: Diagnosis not present

## 2020-05-24 DIAGNOSIS — M25532 Pain in left wrist: Secondary | ICD-10-CM | POA: Diagnosis not present

## 2020-05-24 DIAGNOSIS — M25531 Pain in right wrist: Secondary | ICD-10-CM | POA: Diagnosis not present

## 2020-05-24 DIAGNOSIS — Z79899 Other long term (current) drug therapy: Secondary | ICD-10-CM | POA: Diagnosis not present

## 2020-05-24 DIAGNOSIS — M542 Cervicalgia: Secondary | ICD-10-CM | POA: Diagnosis not present

## 2020-05-24 DIAGNOSIS — G621 Alcoholic polyneuropathy: Secondary | ICD-10-CM | POA: Diagnosis not present

## 2020-05-24 LAB — CULTURE, BLOOD (ROUTINE X 2)
Culture: NO GROWTH
Culture: NO GROWTH
Special Requests: ADEQUATE
Special Requests: ADEQUATE

## 2020-05-25 LAB — CULTURE, BODY FLUID W GRAM STAIN -BOTTLE: Culture: NO GROWTH

## 2020-06-04 ENCOUNTER — Other Ambulatory Visit: Payer: Self-pay

## 2020-06-04 ENCOUNTER — Ambulatory Visit (INDEPENDENT_AMBULATORY_CARE_PROVIDER_SITE_OTHER): Payer: Medicare HMO

## 2020-06-04 ENCOUNTER — Encounter: Payer: Self-pay | Admitting: Orthopaedic Surgery

## 2020-06-04 ENCOUNTER — Ambulatory Visit: Payer: Medicare HMO | Admitting: Orthopaedic Surgery

## 2020-06-04 VITALS — Ht 67.0 in | Wt 140.0 lb

## 2020-06-04 DIAGNOSIS — M25561 Pain in right knee: Secondary | ICD-10-CM

## 2020-06-04 DIAGNOSIS — M25552 Pain in left hip: Secondary | ICD-10-CM | POA: Diagnosis not present

## 2020-06-04 DIAGNOSIS — G8929 Other chronic pain: Secondary | ICD-10-CM | POA: Diagnosis not present

## 2020-06-04 MED ORDER — LIDOCAINE HCL 1 % IJ SOLN
2.0000 mL | INTRAMUSCULAR | Status: AC | PRN
  Administered 2020-06-04: 2 mL

## 2020-06-04 NOTE — Progress Notes (Signed)
Office Visit Note   Patient: Megan Roberson           Date of Birth: [birth year]           MRN: [id number] Visit Date: 06/04/2020              Requested by: Megan Squibb, MD [place],   [id number] PCP: Megan Squibb, MD   Assessment & Plan: Visit Diagnoses:  1. Pain in left hip   2. Acute pain of right knee   3. Chronic pain of right knee     Plan: Hemarthrosis right knee without evidence of obvious fracture by film or evidence of soft tissue laxity.  Aspirated 60 cc of bloody fluid without evidence of fat globules.  Has knee support at home.  Would like to reevaluate her in 2 weeks.  Much better after the aspiration. Also having some pain along the lateral aspect of her left hip.  Skin was intact and x-rays were negative except for an old IM nail for hip fracture  Follow-Up Instructions: Return if symptoms worsen or fail to improve.   Orders:  Orders Placed This Encounter  Procedures  . Large Joint Inj: R knee  . XR KNEE 3 VIEW RIGHT  . XR HIP UNILAT W OR W/O PELVIS 2-3 VIEWS LEFT   No orders of the defined types were placed in this encounter.     Procedures: Large Joint Inj: R knee on 06/04/2020 4:50 PM Indications: pain and diagnostic evaluation Details: 25 G 1.5 in needle, anteromedial approach  Arthrogram: No  Medications: 2 mL lidocaine 1 % Aspirate: 60 mL bloody Outcome: tolerated well, no immediate complications Procedure, treatment alternatives, risks and benefits explained, specific risks discussed. Consent was given by the patient. Immediately prior to procedure a time out was called to verify the correct patient, procedure, equipment, support staff and site/side marked as required. Patient was prepped and draped in the usual sterile fashion.       Clinical Data: No additional findings.   Subjective: Chief Complaint  Patient presents with  . Right Knee - Injury    DOI 05/23/2020  Patient presents today for her right knee. She  fell at a doctors office on 05/23/2020. She thinks that she fell and landed on the lateral side of her right knee. She states that it was very swollen after the fall, but has since went down. Her pain is located medially. She is walking with the assistance of a cane. She is taking oxycodone, as prescribed by her pain management. Her pain is all the time, but worse with walking and movement. She also states that she has hardware in her left hip that was placed 3 years ago after a fall. She states that her hip has been hurting since the fall anytime she bears weight on that leg. Her surgery was done with Megan Roberson. No numbness or tingling down either lower extremity. She has a history of right knee surgery 50years ago after an injury.  He has a history of alcoholic cirrhosis with "liver disease".  Bruises easily.  Knee was "more swollen several weeks ago after the fall but is less swollen "today".  Feeling a little better but still uses a cane  HPI  Review of Systems  Constitutional: Negative for fatigue.  HENT: Negative for ear pain.   Eyes: Negative for pain.  Respiratory: Negative for shortness of breath.   Cardiovascular: Negative for leg swelling.  Gastrointestinal: Negative for constipation  and diarrhea.  Endocrine: Negative for cold intolerance and heat intolerance.  Genitourinary: Negative for difficulty urinating.  Musculoskeletal: Positive for joint swelling.  Skin: Negative for rash.  Allergic/Immunologic: Negative for food allergies.  Neurological: Positive for weakness.  Hematological: Bruises/bleeds easily.  Psychiatric/Behavioral: Positive for sleep disturbance.     Objective: Vital Signs: Ht 5' 7"  (1.702 m)   Wt 140 lb (63.5 kg)   BMI 21.93 kg/m   Physical Exam Constitutional:      Appearance: She is well-developed.  Eyes:     Pupils: Pupils are equal, round, and reactive to light.  Pulmonary:     Effort: Pulmonary effort is normal.  Skin:    General: Skin is warm  and dry.  Neurological:     Mental Status: She is alert and oriented to person, place, and time.  Psychiatric:        Behavior: Behavior normal.     Ortho Exam awake alert and oriented x3.  Accompanied by her husband, Megan Roberson.  Right knee was minimally warm.  Had pain to touch in many places.  No bruising.  No obvious instability.  Full extension with both medial lateral joint pain.  No popliteal pain or mass.  Flexed about 100 but did have pain in multiple areas out of proportion of what I would've expected.  Also had considerable pain on the lateral aspect of her left hip to palpation but no crepitation or skin changes.  Painless range of motion of her hip  Specialty Comments:  No specialty comments available.  Imaging: XR HIP UNILAT W OR W/O PELVIS 2-3 VIEWS LEFT  Result Date: 06/04/2020 AP pelvis demonstrates previously inserted short IM nail in the left hip for no fracture.  No acute changes.  Very minimal degenerative change in the left hip  XR KNEE 3 VIEW RIGHT  Result Date: 06/04/2020 Films of the right knee were obtained in several projections.  There is osteopenia.  There is a prior wire inserted in the area of the tibial tubercle and this 4-prong staple along the medial tibial plateau.  No acute changes.  Tricompartmental degenerative changes with peripheral osteophytes, osteopenia but normal alignment.    PMFS History: Patient Active Problem List   Diagnosis Date Noted  . Pain in right knee 06/04/2020  . Pain in left hip 06/04/2020  . Nausea & vomiting 05/19/2020  . Prolonged QT interval 05/19/2020  . Elevated brain natriuretic peptide (BNP) level 05/19/2020  . Hypoalbuminemia 05/19/2020  . Cough 05/19/2020  . Atelectasis 05/19/2020  . Hypomagnesemia 05/19/2020  . Cysts of both ovaries 06/15/2019  . Closed fracture of left tibial plateau 05/17/18 07/08/2018  . 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 Osteoporosis 02/09/2018  . Colon cancer screening 01/18/2018   . Ascites 11/25/2017  . Cellulitis, abdominal wall 11/25/2017  . SOB (shortness of breath) 11/25/2017  . Protein-calorie malnutrition, severe 11/06/2017  . Ogilvie's syndrome   . Abdominal distension   . Abdominal pain   . Liver failure without hepatic coma ([place])   . Thrombocytopenia ([place])   . Closed fracture of lumbar spine without lesion of spinal cord ([place])   . Cirrhosis of liver ([place]) 04/21/2017  . Anemia 04/21/2017  . History of open reduction and internal fixation (ORIF) procedure 04/21/2017  . Visit for suture removal 04/21/2017  . Left Olecranon fracture 04/21/2017  . Hypokalemia 04/21/2017   Past Medical History:  Diagnosis Date  . Anxiety    pt. denies  . Arthritis   . Cirrhosis  of liver ([place])   . Complication of anesthesia    ileus after surgery 2018  . Depression    "not all the time"  . Heart murmur    Echo 10/06/17 [facility]): Normal LV/RV size, basal septal hypertophy-normal variant, LVEF 65-70%, normal LV/RV function, est RAP 5 mmHg, no sign valvular stenosis or regurg--trace MR/TR/PR  . History of abdominal paracentesis   . History of blood transfusion   . Hypothyroidism   . Ileus ([place])   . Liver disease 2008  . Neuropathy   . Osteoporosis 02/09/2018  . Pancytopenia ([place])   . Pneumonia    2019ish  . Thrombocytopenia ([place]) 12/01/2019  . Vaginal Pap smear, abnormal     Family History  Adopted: Yes  Problem Relation Age of Onset  . Alcohol abuse Daughter     Past Surgical History:  Procedure Laterality Date  . AUGMENTATION MAMMAPLASTY Bilateral    20 years ago  . BIOPSY  03/15/2018   Procedure: BIOPSY;  Surgeon: Yetta Flock, MD;  Location: [facility];  Service: Gastroenterology;;  Gastric  . COLONOSCOPY WITH ESOPHAGOGASTRODUODENOSCOPY (EGD) AND ESOPHAGEAL DILATION (ED)    . COLONOSCOPY WITH PROPOFOL N/A 03/15/2018   Procedure: COLONOSCOPY WITH PROPOFOL;  Surgeon: Yetta Flock, MD;  Location: [facility];  Service: Gastroenterology;   Laterality: N/A;  . ESOPHAGOGASTRODUODENOSCOPY (EGD) WITH PROPOFOL N/A 03/15/2018   Procedure: ESOPHAGOGASTRODUODENOSCOPY (EGD) WITH PROPOFOL;  Surgeon: Yetta Flock, MD;  Location: [facility];  Service: Gastroenterology;  Laterality: N/A;  . ESOPHAGOGASTRODUODENOSCOPY W/ BANDING     Varices  . FRACTURE SURGERY Left    x 2 L elbow and hip left   . HARDWARE REMOVAL Left 09/17/2017   Procedure: HARDWARE REMOVAL LEFT OLECRANON;  Surgeon: Altamese [place], MD;  Location: [facility];  Service: Orthopedics;  Laterality: Left;  . HIP SURGERY Left    from fracture- rod in palce  . IR PARACENTESIS  11/18/2017  . IR PARACENTESIS  11/26/2017  . IR PARACENTESIS  03/14/2018  . KNEE SURGERY Right    open incision for ligaments  . ORIF ELBOW FRACTURE Left 04/30/2017   Procedure: REVISION OPEN REDUCTION INTERNAL FIXATION (ORIF) ELBOW/OLECRANON FRACTURE;  Surgeon: Altamese [place], MD;  Location: [facility];  Service: Orthopedics;  Laterality: Left;  . REPAIR EXTENSOR TENDON Left 12/29/2019   Procedure: LEFT LONG AND LEFT RING FRINGER EXTENSOR REALIGNAMENT WITH POSSIBLE TENDON TRANSFER;  Surgeon: Charlotte Crumb, MD;  Location: [facility];  Service: Orthopedics;  Laterality: Left;   Social History   Occupational History  . Occupation: disabled  Tobacco Use  . Smoking status: Never Smoker  . Smokeless tobacco: Never Used  Vaping Use  . Vaping Use: Never used  Substance and Sexual Activity  . Alcohol use: No  . Drug use: No  . Sexual activity: Not Currently    Birth control/protection: Post-menopausal

## 2020-06-05 ENCOUNTER — Ambulatory Visit: Payer: Medicare HMO | Admitting: Orthopaedic Surgery

## 2020-06-10 ENCOUNTER — Other Ambulatory Visit: Payer: Self-pay

## 2020-06-10 DIAGNOSIS — K7031 Alcoholic cirrhosis of liver with ascites: Secondary | ICD-10-CM

## 2020-06-10 DIAGNOSIS — E876 Hypokalemia: Secondary | ICD-10-CM

## 2020-06-11 ENCOUNTER — Telehealth: Payer: Self-pay | Admitting: Gastroenterology

## 2020-06-12 ENCOUNTER — Other Ambulatory Visit: Payer: Self-pay

## 2020-06-12 DIAGNOSIS — K7460 Unspecified cirrhosis of liver: Secondary | ICD-10-CM

## 2020-06-12 DIAGNOSIS — K7290 Hepatic failure, unspecified without coma: Secondary | ICD-10-CM

## 2020-06-12 DIAGNOSIS — R2681 Unsteadiness on feet: Secondary | ICD-10-CM

## 2020-06-12 DIAGNOSIS — K7682 Hepatic encephalopathy: Secondary | ICD-10-CM

## 2020-06-12 DIAGNOSIS — Z9181 History of falling: Secondary | ICD-10-CM

## 2020-06-12 DIAGNOSIS — R188 Other ascites: Secondary | ICD-10-CM

## 2020-06-12 DIAGNOSIS — I8500 Esophageal varices without bleeding: Secondary | ICD-10-CM

## 2020-06-12 NOTE — Telephone Encounter (Signed)
Ambulatory referral to Neurology placed for patient, left detailed message for patient letting her know that a new referral has been placed and that the neurology office will be in contact with her to schedule an appt, advised patient to call back if she had any questions.

## 2020-06-17 ENCOUNTER — Encounter: Payer: Self-pay | Admitting: Neurology

## 2020-06-18 DIAGNOSIS — M25552 Pain in left hip: Secondary | ICD-10-CM | POA: Diagnosis not present

## 2020-06-18 DIAGNOSIS — Z79899 Other long term (current) drug therapy: Secondary | ICD-10-CM | POA: Diagnosis not present

## 2020-06-18 DIAGNOSIS — G8929 Other chronic pain: Secondary | ICD-10-CM | POA: Diagnosis not present

## 2020-06-18 DIAGNOSIS — D649 Anemia, unspecified: Secondary | ICD-10-CM | POA: Diagnosis not present

## 2020-06-18 DIAGNOSIS — R739 Hyperglycemia, unspecified: Secondary | ICD-10-CM | POA: Diagnosis not present

## 2020-06-18 DIAGNOSIS — R945 Abnormal results of liver function studies: Secondary | ICD-10-CM | POA: Diagnosis not present

## 2020-06-28 ENCOUNTER — Other Ambulatory Visit (HOSPITAL_COMMUNITY): Payer: Self-pay | Admitting: Internal Medicine

## 2020-06-28 DIAGNOSIS — F419 Anxiety disorder, unspecified: Secondary | ICD-10-CM | POA: Diagnosis not present

## 2020-06-28 DIAGNOSIS — K769 Liver disease, unspecified: Secondary | ICD-10-CM | POA: Diagnosis not present

## 2020-06-28 DIAGNOSIS — D649 Anemia, unspecified: Secondary | ICD-10-CM | POA: Diagnosis not present

## 2020-06-28 DIAGNOSIS — K7011 Alcoholic hepatitis with ascites: Secondary | ICD-10-CM | POA: Diagnosis not present

## 2020-07-06 ENCOUNTER — Emergency Department (HOSPITAL_COMMUNITY)
Admission: EM | Admit: 2020-07-06 | Discharge: 2020-07-07 | Disposition: A | Discharge status: Home / Self Care | Payer: Medicare HMO | Attending: Emergency Medicine | Admitting: Emergency Medicine

## 2020-07-06 ENCOUNTER — Other Ambulatory Visit: Payer: Self-pay

## 2020-07-06 ENCOUNTER — Encounter (HOSPITAL_COMMUNITY): Payer: Self-pay | Admitting: Emergency Medicine

## 2020-07-06 ENCOUNTER — Emergency Department (HOSPITAL_COMMUNITY): Payer: Medicare HMO

## 2020-07-06 DIAGNOSIS — M25511 Pain in right shoulder: Secondary | ICD-10-CM | POA: Insufficient documentation

## 2020-07-06 DIAGNOSIS — R1031 Right lower quadrant pain: Secondary | ICD-10-CM | POA: Insufficient documentation

## 2020-07-06 DIAGNOSIS — S3993XA Unspecified injury of pelvis, initial encounter: Secondary | ICD-10-CM | POA: Diagnosis not present

## 2020-07-06 DIAGNOSIS — Y998 Other external cause status: Secondary | ICD-10-CM | POA: Insufficient documentation

## 2020-07-06 DIAGNOSIS — S0511XA Contusion of eyeball and orbital tissues, right eye, initial encounter: Secondary | ICD-10-CM | POA: Diagnosis not present

## 2020-07-06 DIAGNOSIS — W19XXXA Unspecified fall, initial encounter: Secondary | ICD-10-CM | POA: Diagnosis not present

## 2020-07-06 DIAGNOSIS — S59901A Unspecified injury of right elbow, initial encounter: Secondary | ICD-10-CM | POA: Diagnosis not present

## 2020-07-06 DIAGNOSIS — Y9389 Activity, other specified: Secondary | ICD-10-CM | POA: Insufficient documentation

## 2020-07-06 DIAGNOSIS — Y9289 Other specified places as the place of occurrence of the external cause: Secondary | ICD-10-CM | POA: Insufficient documentation

## 2020-07-06 DIAGNOSIS — S3991XA Unspecified injury of abdomen, initial encounter: Secondary | ICD-10-CM | POA: Diagnosis not present

## 2020-07-06 DIAGNOSIS — S199XXA Unspecified injury of neck, initial encounter: Secondary | ICD-10-CM | POA: Diagnosis not present

## 2020-07-06 DIAGNOSIS — E039 Hypothyroidism, unspecified: Secondary | ICD-10-CM | POA: Diagnosis not present

## 2020-07-06 DIAGNOSIS — S0990XA Unspecified injury of head, initial encounter: Secondary | ICD-10-CM | POA: Insufficient documentation

## 2020-07-06 DIAGNOSIS — R079 Chest pain, unspecified: Secondary | ICD-10-CM | POA: Diagnosis not present

## 2020-07-06 DIAGNOSIS — S0083XA Contusion of other part of head, initial encounter: Secondary | ICD-10-CM | POA: Diagnosis not present

## 2020-07-06 DIAGNOSIS — Z1211 Encounter for screening for malignant neoplasm of colon: Secondary | ICD-10-CM | POA: Diagnosis not present

## 2020-07-06 DIAGNOSIS — M545 Low back pain: Secondary | ICD-10-CM | POA: Diagnosis not present

## 2020-07-06 DIAGNOSIS — R188 Other ascites: Secondary | ICD-10-CM | POA: Diagnosis not present

## 2020-07-06 DIAGNOSIS — S3992XA Unspecified injury of lower back, initial encounter: Secondary | ICD-10-CM | POA: Diagnosis not present

## 2020-07-06 DIAGNOSIS — M5489 Other dorsalgia: Secondary | ICD-10-CM | POA: Insufficient documentation

## 2020-07-06 DIAGNOSIS — R109 Unspecified abdominal pain: Secondary | ICD-10-CM | POA: Diagnosis not present

## 2020-07-06 DIAGNOSIS — J90 Pleural effusion, not elsewhere classified: Secondary | ICD-10-CM | POA: Diagnosis not present

## 2020-07-06 DIAGNOSIS — S0181XA Laceration without foreign body of other part of head, initial encounter: Secondary | ICD-10-CM | POA: Diagnosis not present

## 2020-07-06 DIAGNOSIS — S4991XA Unspecified injury of right shoulder and upper arm, initial encounter: Secondary | ICD-10-CM | POA: Diagnosis not present

## 2020-07-06 DIAGNOSIS — S299XXA Unspecified injury of thorax, initial encounter: Secondary | ICD-10-CM | POA: Diagnosis not present

## 2020-07-06 DIAGNOSIS — G501 Atypical facial pain: Secondary | ICD-10-CM | POA: Diagnosis present

## 2020-07-06 LAB — BASIC METABOLIC PANEL
Anion gap: 9 (ref 5–15)
BUN: 7 mg/dL — ABNORMAL LOW (ref 8–23)
CO2: 23 mmol/L (ref 22–32)
Calcium: 8.4 mg/dL — ABNORMAL LOW (ref 8.9–10.3)
Chloride: 109 mmol/L (ref 98–111)
Creatinine, Ser: 0.74 mg/dL (ref 0.44–1.00)
GFR calc Af Amer: >60 mL/min (ref >60)
GFR calc non Af Amer: >60 mL/min (ref >60)
Glucose, Bld: 139 mg/dL — ABNORMAL HIGH (ref 70–99)
Potassium: 4.2 mmol/L (ref 3.5–5.1)
Sodium: 141 mmol/L (ref 135–145)

## 2020-07-06 LAB — CBC
HCT: 37.1 % (ref 36.0–46.0)
Hemoglobin: 10.4 g/dL — ABNORMAL LOW (ref 12.0–15.0)
MCH: 25 pg — ABNORMAL LOW (ref 26.0–34.0)
MCHC: 28 g/dL — ABNORMAL LOW (ref 30.0–36.0)
MCV: 89.2 fL (ref 80.0–100.0)
Platelets: 35 10*3/uL — ABNORMAL LOW (ref 150–400)
RBC: 4.16 MIL/uL (ref 3.87–5.11)
RDW: 18 % — ABNORMAL HIGH (ref 11.5–15.5)
WBC: 5.5 10*3/uL (ref 4.0–10.5)
nRBC: 0 % (ref 0.0–0.2)

## 2020-07-06 NOTE — ED Triage Notes (Addendum)
Pt presents to ED POv. Pt c/o mechanical fall. Pt reports that she was standing and her legs gave out. Pt AAO x4, PERRLA. No blood thinners hematoma above R eye, skin tear on R arm. Pt reports limited ROM in R arm. Pt reports that she has walker at home she was not using

## 2020-07-07 ENCOUNTER — Emergency Department (HOSPITAL_COMMUNITY): Payer: Medicare HMO

## 2020-07-07 DIAGNOSIS — S3992XA Unspecified injury of lower back, initial encounter: Secondary | ICD-10-CM | POA: Diagnosis not present

## 2020-07-07 DIAGNOSIS — S299XXA Unspecified injury of thorax, initial encounter: Secondary | ICD-10-CM | POA: Diagnosis not present

## 2020-07-07 DIAGNOSIS — S3993XA Unspecified injury of pelvis, initial encounter: Secondary | ICD-10-CM | POA: Diagnosis not present

## 2020-07-07 DIAGNOSIS — S199XXA Unspecified injury of neck, initial encounter: Secondary | ICD-10-CM | POA: Diagnosis not present

## 2020-07-07 DIAGNOSIS — M25511 Pain in right shoulder: Secondary | ICD-10-CM | POA: Diagnosis not present

## 2020-07-07 DIAGNOSIS — S3991XA Unspecified injury of abdomen, initial encounter: Secondary | ICD-10-CM | POA: Diagnosis not present

## 2020-07-07 DIAGNOSIS — S4991XA Unspecified injury of right shoulder and upper arm, initial encounter: Secondary | ICD-10-CM | POA: Diagnosis not present

## 2020-07-07 DIAGNOSIS — M545 Low back pain: Secondary | ICD-10-CM | POA: Diagnosis not present

## 2020-07-07 DIAGNOSIS — R079 Chest pain, unspecified: Secondary | ICD-10-CM | POA: Diagnosis not present

## 2020-07-07 DIAGNOSIS — R109 Unspecified abdominal pain: Secondary | ICD-10-CM | POA: Diagnosis not present

## 2020-07-07 LAB — RAPID URINE DRUG SCREEN, HOSP PERFORMED
Amphetamines: NOT DETECTED
Barbiturates: NOT DETECTED
Benzodiazepines: NOT DETECTED
Cocaine: NOT DETECTED
Opiates: NOT DETECTED
Tetrahydrocannabinol: NOT DETECTED

## 2020-07-07 LAB — URINALYSIS, ROUTINE W REFLEX MICROSCOPIC
Bacteria, UA: NONE SEEN
Glucose, UA: NEGATIVE mg/dL
Ketones, ur: NEGATIVE mg/dL
Nitrite: NEGATIVE
Protein, ur: 100 mg/dL — AB
RBC / HPF: >50 RBC/hpf — ABNORMAL HIGH (ref 0–5)
Specific Gravity, Urine: >1.046 — ABNORMAL HIGH (ref 1.005–1.030)
pH: 6 (ref 5.0–8.0)

## 2020-07-07 LAB — HEPATIC FUNCTION PANEL
ALT: 33 U/L (ref 0–44)
AST: 64 U/L — ABNORMAL HIGH (ref 15–41)
Albumin: 2.4 g/dL — ABNORMAL LOW (ref 3.5–5.0)
Alkaline Phosphatase: 84 U/L (ref 38–126)
Bilirubin, Direct: 1 mg/dL — ABNORMAL HIGH (ref 0.0–0.2)
Indirect Bilirubin: 2.8 mg/dL — ABNORMAL HIGH (ref 0.3–0.9)
Total Bilirubin: 3.8 mg/dL — ABNORMAL HIGH (ref 0.3–1.2)
Total Protein: 5.4 g/dL — ABNORMAL LOW (ref 6.5–8.1)

## 2020-07-07 LAB — AMMONIA: Ammonia: 30 umol/L (ref 9–35)

## 2020-07-07 LAB — ETHANOL: Alcohol, Ethyl (B): <10 mg/dL (ref <10)

## 2020-07-07 LAB — LIPASE, BLOOD: Lipase: 27 U/L (ref 11–51)

## 2020-07-07 MED ORDER — BACITRACIN ZINC 500 UNIT/GM EX OINT
TOPICAL_OINTMENT | Freq: Two times a day (BID) | CUTANEOUS | Status: DC
  Administered 2020-07-07: 1 via TOPICAL
  Filled 2020-07-07: qty 0.9

## 2020-07-07 MED ORDER — OXYCODONE HCL 5 MG PO TABS
5.0000 mg | ORAL_TABLET | Freq: Once | ORAL | Status: AC
  Administered 2020-07-07: 5 mg via ORAL
  Filled 2020-07-07: qty 1

## 2020-07-07 MED ORDER — FENTANYL CITRATE (PF) 100 MCG/2ML IJ SOLN
50.0000 ug | Freq: Once | INTRAMUSCULAR | Status: AC
  Administered 2020-07-07: 50 ug via INTRAVENOUS
  Filled 2020-07-07: qty 2

## 2020-07-07 MED ORDER — IOHEXOL 300 MG/ML  SOLN
100.0000 mL | Freq: Once | INTRAMUSCULAR | Status: AC | PRN
  Administered 2020-07-07: 100 mL via INTRAVENOUS

## 2020-07-07 NOTE — Discharge Instructions (Addendum)
At this time there does not appear to be the presence of an emergent medical condition, however there is always the potential for conditions to change. Please read and follow the below instructions.  Please return to the Emergency Department immediately for any new or worsening symptoms. Please be sure to follow up with your Primary Care Provider within one week regarding your visit today; please call their office to schedule an appointment even if you are feeling better for a follow-up visit. Face-to-face encounter for physical therapy/occupational therapy and home health aide have been ordered.  Please keep your phone by you for the next few days as they should be calling to set up an appointment with you for further evaluation. Please use your wheelchair to help get around your home to avoid future falls. Please be sure to keep your skin tears clean, rinse gently with clean soapy water and keep covered with sterile bandage and antibiotic ointment.  Please be sure to discuss your tetanus shot with your primary care doctor at your follow-up visit since you refused one today. As we discussed multiple incidental findings were seen on your imaging today.  These include ascites in your abdomen, fluid around your lung, arthritis, bruising and swelling, chronic age-related and small vessel ischemic changes of the brain, portal hypertension, chronic thickening of your colon, atheromatosis calcifications, kidney cysts, old broken bones, gallstones and kidney stones.  Please discuss all incidental findings with your primary care doctor and other specialist at your follow-up visit.  You may review all of your images in detail with your primary care doctor on your MyChart account. Be sure to follow-up with your specialists for drainage of fluid around your abdomen and lungs as scheduled. You received pain medication in the ER today which may make you drowsy.  Do not drive, drink alcohol or take any other sedating  medications or perform any potentially dangerous activities for the rest of the day.  Get help right away if: You have: A very bad headache that is not helped by medicine. Trouble walking or weakness in your arms and legs. Clear or bloody fluid coming from your nose or ears. Changes in how you see (vision). Shaking movements that you cannot control. You lose your balance. You vomit. The black centers of your eyes (pupils) change in size. Your speech is slurred. Your dizziness gets worse. You pass out. You are sleepier than normal and have trouble staying awake. You are short of breath. You develop chest pain. You develop a new cough. You have a fever. You are confused. You have new or worsening breathing trouble. You have new or worsening pain in your abdomen. You have any new/concerning or worsening of symptoms These symptoms may be an emergency. Do not wait to see if the symptoms will go away. Get medical help right away. Call your local emergency services (911 in the [place] Do not drive yourself to the hospital.  Please read the additional information packets attached to your discharge summary.  Do not take your medicine if  develop an itchy rash, swelling in your mouth or lips, or difficulty breathing; call 911 and seek immediate emergency medical attention if this occurs.  You may review your lab tests and imaging results in their entirety on your MyChart account.  Please discuss all results of fully with your primary care provider and other specialist at your follow-up visit.  Note: Portions of this text may have been transcribed using voice recognition software. Every effort was made to  ensure accuracy; however, inadvertent computerized transcription errors may still be present.

## 2020-07-07 NOTE — ED Notes (Signed)
Patient transported to CT 

## 2020-07-07 NOTE — ED Provider Notes (Signed)
[facility] EMERGENCY DEPARTMENT Provider Note   CSN: [id number] Arrival date & time: 07/06/20  1907     History Chief Complaint  Patient presents with  . Fall    Megan Roberson is a 64 y.o. female history includes cirrhosis, osteoporosis.  Patient presents today after a fall that occurred just prior to arrival.  She reports that she was standing at home when her legs "gave out".  She reports this happens frequently and has had 5 falls in the past 2 months.  When she fell she struck the right side of her head on the ground, denies loss of consciousness or blood thinner use.  She reports immediate pain to the right side of her face a throbbing sensation moderate intensity constant nonradiating worse with palpation improved with rest.  She noticed some bleeding and direct pressure was applied and patient came to the emergency department.  She reports she is also having pain of the right shoulder this is a throbbing pain that is moderate worsened with movement nonradiating.  She suffered skin tears to her right elbow as well.  She reports she is supposed to be using a walker at home but does not, she reports that the walker typically does not help her as when her legs give out she is unable to hold herself up using the walker.  She reports diffuse pain to her body from multiple falls as well.  Patient's second concern today is abdominal pain and distention, she feels that she is in need of a paracentesis.  Reports this is a common problem for her but feels that her pain is worse today primarily in the right lower quadrant, describes a pressure sensation moderate-severe in intensity nonradiating.  Patient denies syncope, lightheadedness, vision changes, headache, neck pain, chest pain/shortness of breath, nausea/vomiting, numbness/weakness, tingling, saddle area paresthesias, bowel/bladder incontinence, urinary retention.  HPI     Past Medical History:  Diagnosis Date  .  Anxiety    pt. denies  . Arthritis   . Cirrhosis of liver ([place])   . Complication of anesthesia    ileus after surgery 2018  . Depression    "not all the time"  . Heart murmur    Echo 10/06/17 [facility]): Normal LV/RV size, basal septal hypertophy-normal variant, LVEF 65-70%, normal LV/RV function, est RAP 5 mmHg, no sign valvular stenosis or regurg--trace MR/TR/PR  . History of abdominal paracentesis   . History of blood transfusion   . Hypothyroidism   . Ileus ([place])   . Liver disease 2008  . Neuropathy   . Osteoporosis 02/09/2018  . Pancytopenia ([place])   . Pneumonia    2019ish  . Thrombocytopenia ([place]) 12/01/2019  . Vaginal Pap smear, abnormal     Patient Active Problem List   Diagnosis Date Noted  . Pain in right knee 06/04/2020  . Pain in left hip 06/04/2020  . Nausea & vomiting 05/19/2020  . Prolonged QT interval 05/19/2020  . Elevated brain natriuretic peptide (BNP) level 05/19/2020  . Hypoalbuminemia 05/19/2020  . Cough 05/19/2020  . Atelectasis 05/19/2020  . Hypomagnesemia 05/19/2020  . Cysts of both ovaries 06/15/2019  . Closed fracture of left tibial plateau 05/17/18 07/08/2018  . 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 Osteoporosis 02/09/2018  . Colon cancer screening 01/18/2018  . Ascites 11/25/2017  . Cellulitis, abdominal wall 11/25/2017  . SOB (shortness of breath) 11/25/2017  . Protein-calorie malnutrition, severe 11/06/2017  . Ogilvie's syndrome   . Abdominal distension   .  Abdominal pain   . Liver failure without hepatic coma ([place])   . Thrombocytopenia ([place])   . Closed fracture of lumbar spine without lesion of spinal cord ([place])   . Cirrhosis of liver ([place]) 04/21/2017  . Anemia 04/21/2017  . History of open reduction and internal fixation (ORIF) procedure 04/21/2017  . Visit for suture removal 04/21/2017  . Left Olecranon fracture 04/21/2017  . Hypokalemia 04/21/2017    Past Surgical History:  Procedure Laterality Date  . AUGMENTATION  MAMMAPLASTY Bilateral    20 years ago  . BIOPSY  03/15/2018   Procedure: BIOPSY;  Surgeon: Yetta Flock, MD;  Location: [facility];  Service: Gastroenterology;;  Gastric  . COLONOSCOPY WITH ESOPHAGOGASTRODUODENOSCOPY (EGD) AND ESOPHAGEAL DILATION (ED)    . COLONOSCOPY WITH PROPOFOL N/A 03/15/2018   Procedure: COLONOSCOPY WITH PROPOFOL;  Surgeon: Yetta Flock, MD;  Location: [facility];  Service: Gastroenterology;  Laterality: N/A;  . ESOPHAGOGASTRODUODENOSCOPY (EGD) WITH PROPOFOL N/A 03/15/2018   Procedure: ESOPHAGOGASTRODUODENOSCOPY (EGD) WITH PROPOFOL;  Surgeon: Yetta Flock, MD;  Location: [facility];  Service: Gastroenterology;  Laterality: N/A;  . ESOPHAGOGASTRODUODENOSCOPY W/ BANDING     Varices  . FRACTURE SURGERY Left    x 2 L elbow and hip left   . HARDWARE REMOVAL Left 09/17/2017   Procedure: HARDWARE REMOVAL LEFT OLECRANON;  Surgeon: Altamese [place], MD;  Location: [place];  Service: Orthopedics;  Laterality: Left;  . HIP SURGERY Left    from fracture- rod in palce  . IR PARACENTESIS  11/18/2017  . IR PARACENTESIS  11/26/2017  . IR PARACENTESIS  03/14/2018  . KNEE SURGERY Right    open incision for ligaments  . ORIF ELBOW FRACTURE Left 04/30/2017   Procedure: REVISION OPEN REDUCTION INTERNAL FIXATION (ORIF) ELBOW/OLECRANON FRACTURE;  Surgeon: Altamese [place], MD;  Location: [place];  Service: Orthopedics;  Laterality: Left;  . REPAIR EXTENSOR TENDON Left 12/29/2019   Procedure: LEFT LONG AND LEFT RING FRINGER EXTENSOR REALIGNAMENT WITH POSSIBLE TENDON TRANSFER;  Surgeon: Charlotte Crumb, MD;  Location: [place];  Service: Orthopedics;  Laterality: Left;     OB History    Gravida  2   Para  2   Term  2   Preterm      AB      Living  1     SAB      TAB      Ectopic      Multiple      Live Births  2           Family History  Adopted: Yes  Problem Relation Age of Onset  . Alcohol abuse Daughter     Social History   Tobacco Use  .  Smoking status: Never Smoker  . Smokeless tobacco: Never Used  Vaping Use  . Vaping Use: Never used  Substance Use Topics  . Alcohol use: No  . Drug use: No    Home Medications Prior to Admission medications   Medication Sig Start Date End Date Taking? Authorizing Provider  denosumab (PROLIA) 60 MG/ML SOSY injection Inject 60 mg into the skin every 6 (six) months.   Yes [provider]  diclofenac Sodium (VOLTAREN) 1 % GEL Apply 2 g topically daily as needed (for pain).  04/23/20  Yes [provider]  furosemide (LASIX) 80 MG tablet Take 0.5 tablets (40 mg total) by mouth daily. 05/21/20  Yes Emokpae, Courage, MD  levOCARNitine (CARNITOR) 330 MG tablet Take 330 mg by mouth 3 (three) times  daily.   Yes [provider]  levothyroxine (SYNTHROID, LEVOTHROID) 75 MCG tablet Take 1 tablet (75 mcg total) by mouth daily before breakfast. 12/02/17  Yes Aline August, MD  oxyCODONE (OXY IR/ROXICODONE) 5 MG immediate release tablet Take 1 tablet (5 mg total) by mouth every 12 (twelve) hours as needed for severe pain. 05/21/20  Yes Roxan Hockey, MD  Potassium Chloride ER 20 MEQ TBCR Take 20 mEq by mouth every Tuesday, Thursday, Saturday, and Sunday. 05/21/20  Yes Emokpae, Courage, MD  rifaximin (XIFAXAN) 550 MG TABS tablet Take 1 tablet (550 mg total) by mouth 2 (two) times daily. 05/21/20  Yes Emokpae, Courage, MD  sertraline (ZOLOFT) 50 MG tablet Take 50 mg by mouth daily. 05/20/20  Yes [provider]  spironolactone (ALDACTONE) 100 MG tablet Take 1.5 tablets (150 mg total) by mouth daily. 05/21/20  Yes Emokpae, Courage, MD  traZODone (DESYREL) 50 MG tablet Take 50 mg by mouth at bedtime as needed for sleep.    Yes [provider]    Allergies    Penicillins, Tylenol [acetaminophen], Sulfa antibiotics, and Tramadol  Review of Systems   Review of Systems Ten systems are reviewed and are negative for acute change except as noted in the HPI  Physical  Exam Updated Vital Signs BP 116/78   Pulse 65   Temp 97.7 F (36.5 C) (Oral)   Resp 16   SpO2 100%   Physical Exam Constitutional:      General: She is awake. She is not in acute distress.    Appearance: She is well-developed and underweight. She is not toxic-appearing.  HENT:     Head: Normocephalic. Abrasion and contusion present.     Jaw: There is normal jaw occlusion. No trismus.      Comments: Bruising around the right lateral orbit.  Small less than 5 mm laceration/abrasion present.    Right Ear: External ear normal. No hemotympanum.     Left Ear: External ear normal. No hemotympanum.     Nose: Nose normal.     Mouth/Throat:     Mouth: Mucous membranes are moist.     Pharynx: Oropharynx is clear.  Eyes:     General: Vision grossly intact. Gaze aligned appropriately.     Extraocular Movements: Extraocular movements intact.     Conjunctiva/sclera: Conjunctivae normal.     Comments: No entrapment, no pain with extraocular motion.  Neck:     Trachea: Trachea and phonation normal. No tracheal tenderness or tracheal deviation.  Cardiovascular:     Rate and Rhythm: Normal rate and regular rhythm.     Pulses:          Dorsalis pedis pulses are 2+ on the right side and 2+ on the left side.  Pulmonary:     Effort: Pulmonary effort is normal.     Breath sounds: Normal breath sounds and air entry.  Chest:     Chest wall: No deformity, tenderness or crepitus.  Abdominal:     General: Bowel sounds are normal.     Palpations: Abdomen is soft.     Tenderness: There is generalized abdominal tenderness. There is no guarding or rebound.  Musculoskeletal:     Cervical back: Normal range of motion and neck supple. No spinous process tenderness or muscular tenderness.     Comments: Numerous bruises on bilateral upper and lower extremities of varying age.  Patient also has skin tear to the right elbow/upper arm subcentimeter. - Patient has pain with range of motion  at the right  shoulder bruising present around the shoulder.  Range of motion is intact with increased pain.  Appropriate range of motion and strength of the right elbow wrist and hand without pain.  All other major joints of the upper and lower extremities were immobilized with appropriate range of motion for age and conditioning without pain or deformity.  Capillary refill and sensation intact to all extremities, strong and equal distal pulses, compartments soft.  Patient with diffuse back pain, small bruise present to lower back, no skin breaks.  No crepitus step-off or deformity of the spine.  Feet:     Right foot:     Protective Sensation: 3 sites tested. 3 sites sensed.     Left foot:     Protective Sensation: 3 sites tested. 3 sites sensed.  Skin:    General: Skin is warm and dry.     Findings: Abrasion, bruising, ecchymosis and signs of injury present.  Neurological:     Mental Status: She is alert and oriented to person, place, and time.     GCS: GCS eye subscore is 4. GCS verbal subscore is 5. GCS motor subscore is 6.  Psychiatric:        Mood and Affect: Mood normal.        Speech: Speech normal.        Behavior: Behavior normal. Behavior is cooperative.     ED Results / Procedures / Treatments   Labs (all labs ordered are listed, but only abnormal results are displayed) Labs Reviewed  CBC - Abnormal; Notable for the following components:      Result Value   Hemoglobin 10.4 (*)    MCH 25.0 (*)    MCHC 28.0 (*)    RDW 18.0 (*)    Platelets 35 (*)    All other components within normal limits  BASIC METABOLIC PANEL - Abnormal; Notable for the following components:   Glucose, Bld 139 (*)    BUN 7 (*)    Calcium 8.4 (*)    All other components within normal limits  HEPATIC FUNCTION PANEL - Abnormal; Notable for the following components:   Total Protein 5.4 (*)    Albumin 2.4 (*)    AST 64 (*)    Total Bilirubin 3.8 (*)    Bilirubin, Direct 1.0 (*)    Indirect Bilirubin 2.8 (*)     All other components within normal limits  URINALYSIS, ROUTINE W REFLEX MICROSCOPIC - Abnormal; Notable for the following components:   Color, Urine AMBER (*)    APPearance HAZY (*)    Specific Gravity, Urine >1.046 (*)    Hgb urine dipstick LARGE (*)    Bilirubin Urine SMALL (*)    Protein, ur 100 (*)    Leukocytes,Ua SMALL (*)    RBC / HPF >50 (*)    All other components within normal limits  LIPASE, BLOOD  AMMONIA  ETHANOL  RAPID URINE DRUG SCREEN, HOSP PERFORMED    EKG None  Radiology DG Chest 2 View  Result Date: 07/07/2020 CLINICAL DATA:  Chest pain after fall. EXAM: CHEST - 2 VIEW COMPARISON:  May 21, 2020. FINDINGS: Stable cardiomediastinal silhouette. No pneumothorax is noted. Right lung is clear. Mild left pleural effusion is noted with probable underlying atelectasis or infiltrate. Bony thorax is unremarkable IMPRESSION: Mild left pleural effusion with probable underlying atelectasis or infiltrate. Electronically Signed   By: Marijo Conception M.D.   On: 07/07/2020 09:37   DG Shoulder Right  Result Date: 07/07/2020 CLINICAL DATA:  Right shoulder pain after fall yesterday. EXAM: RIGHT SHOULDER - 2+ VIEW COMPARISON:  None. FINDINGS: There is no evidence of fracture or dislocation. Mild degenerative changes seen involving the right acromioclavicular joint. Soft tissues are unremarkable. IMPRESSION: Mild degenerative joint disease of the right acromioclavicular joint. No acute abnormality seen in the right shoulder. Electronically Signed   By: Marijo Conception M.D.   On: 07/07/2020 09:38   DG Elbow 2 Views Right  Result Date: 07/06/2020 CLINICAL DATA:  Mechanical fall, right elbow injury EXAM: RIGHT ELBOW - 2 VIEW COMPARISON:  None. FINDINGS: There is no evidence of fracture, dislocation, or joint effusion. There is no evidence of arthropathy or other focal bone abnormality. Soft tissues are unremarkable. IMPRESSION: Negative. Electronically Signed   By: Fidela Salisbury MD   On:  07/06/2020 20:18   CT Head Wo Contrast  Result Date: 07/06/2020 CLINICAL DATA:  Fall, bruise to the right side of face and eye EXAM: CT HEAD WITHOUT CONTRAST TECHNIQUE: Contiguous axial images were obtained from the base of the skull through the vertex without intravenous contrast. COMPARISON:  None. FINDINGS: Brain: No evidence of acute territorial infarction, hemorrhage, hydrocephalus,extra-axial collection or mass lesion/mass effect. There is dilatation the ventricles and sulci consistent with age-related atrophy. Low-attenuation changes in the deep white matter consistent with small vessel ischemia. Vascular: No hyperdense vessel or unexpected calcification. Skull: The skull is intact. No fracture or focal lesion identified. Sinuses/Orbits: The visualized paranasal sinuses and mastoid air cells are clear. The orbits and globes intact. Other: Soft tissue swelling seen around the right periorbital region. Face: Osseous: No acute fracture or other significant osseous abnormality.The nasal bone, mandibles, zygomatic arches and pterygoid plates are intact. Orbits: No fracture identified. Unremarkable appearance of globes and orbits. Sinuses: The visualized paranasal sinuses and mastoid air cells are unremarkable. Soft tissues: Right frontal skull and periorbital soft tissue swelling and a small hematoma is noted. Limited intracranial: No acute findings. IMPRESSION: No acute intracranial abnormality. Findings consistent with age related atrophy and chronic small vessel ischemia No acute facial fracture Small soft tissue hematoma with soft tissue swelling around the right frontal skull and periorbital region. Electronically Signed   By: Prudencio Pair M.D.   On: 07/06/2020 20:32   CT Cervical Spine Wo Contrast  Result Date: 07/07/2020 CLINICAL DATA:  Fall yesterday.  Minor head trauma. EXAM: CT CERVICAL SPINE WITHOUT CONTRAST TECHNIQUE: Multidetector CT imaging of the cervical spine was performed without  intravenous contrast. Multiplanar CT image reconstructions were also generated. COMPARISON:  None. FINDINGS: Alignment: No traumatic malalignment.  Mild C3-4 anterolisthesis. Skull base and vertebrae: No acute fracture. No primary bone lesion or focal pathologic process. Soft tissues and spinal canal: No prevertebral fluid or swelling. No visible canal hematoma. Disc levels: Facet ankylosis at C2-3 on the right and at C3-4 bilaterally. C4-5 to C6-7 advanced disc degeneration. No visible cord impingement Upper chest: Minimally covered opacity at the right apex, nonspecific. Left pleural effusion which extends from base to apex. There was preceding chest x-ray. IMPRESSION: No evidence of acute cervical spine injury. Electronically Signed   By: Monte Fantasia M.D.   On: 07/07/2020 10:49   CT ABDOMEN PELVIS W CONTRAST  Result Date: 07/07/2020 CLINICAL DATA:  Fall yesterday with lower back pain. Abdominal pain. EXAM: CT ABDOMEN AND PELVIS WITH CONTRAST TECHNIQUE: Multidetector CT imaging of the abdomen and pelvis was performed using the standard protocol following bolus administration of intravenous contrast. CONTRAST:  143m  OMNIPAQUE IOHEXOL 300 MG/ML  SOLN COMPARISON:  07/07/2018 FINDINGS: Lower chest: Moderate if not large left pleural effusion that is low-density and layering. Hepatobiliary: Cirrhotic liver with macroscopic bands of fibrosis. There is a visible shunt from portal to hepatic veins in the central liver. Recanalized umbilical vein with hypertrophy. Ascites is moderate and mainly about the liver and in the pelvis.Cholelithiasis without findings of cholecystitis Pancreas: Unremarkable. Spleen: Enlargement in the setting of portal hypertension. No superimposed infarct or hemorrhage. Adrenals/Urinary Tract: Negative adrenals. Early excretion of contrast into the urinary collecting systems. 12 mm right renal pelvis calcification with indistinct but non hydronephrotic renal pelvis, chronic. Unremarkable  bladder. Stomach/Bowel: Chronic thickening of the ascending colon with submucosal low-density appearance, likely portal colopathy. No acute bowel inflammation suspected. No bowel obstruction Vascular/Lymphatic: Portal hypertension. No portal venous thrombosis or occlusion. Scattered atheromatous calcifications. No mass or adenopathy. Reproductive:Bilateral renal cystic densities measuring up to 2.8 cm on the right. These were characterized as benign by a January 2021 pelvis MRI. Other: Negative for pneumoperitoneum Musculoskeletal: Proximal left femur repair. Remote left iliac wing fracture. Remote L5, S2, L1, and T12 fractures. These are all chronic and also seen on a 2019 study. IMPRESSION: 1. Cirrhosis with portal hypertension. Moderate ascites and moderate to large left pleural effusion which has increased from a may 2021 comparison. 2. 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 3. Cholelithiasis and right nephrolithiasis. Electronically Signed   By: Monte Fantasia M.D.   On: 07/07/2020 10:45   CT L-SPINE NO CHARGE  Result Date: 07/07/2020 CLINICAL DATA:  Fall yesterday with lower back pain.  Abdomen pain. EXAM: CT Lumbar spine with contrast TECHNIQUE: Multiplanar CT images of lumbar spine were reconstructed from contemporary CT of abdomen and pelvis. CONTRAST:  None additional COMPARISON:  Abdomen and pelvis CT 07/07/2018 FINDINGS: CT LUMBAR SPINE FINDINGS Segmentation: Five lumbar type vertebrae Alignment: Dextroscoliosis. No traumatic malalignment. Vertebrae: Remote L5, L1, and L2 compression fractures. Remote S2 fracture with wedging. These were all seen on prior study. Paraspinal and other soft tissues: Reported on dedicated source images Disc levels: Mild degenerative changes for age. No visible cord impingement. IMPRESSION: 1. No acute finding. 2. Remote T12, L1, L5, and S2 body fractures. Electronically Signed   By: Monte Fantasia M.D.   On: 07/07/2020 11:09   DG Humerus  Right  Result Date: 07/06/2020 CLINICAL DATA:  Fall, right arm injury EXAM: RIGHT HUMERUS - 2+ VIEW COMPARISON:  None. FINDINGS: There is no evidence of fracture or other focal bone lesions. Soft tissues are unremarkable. IMPRESSION: Negative. Electronically Signed   By: Fidela Salisbury MD   On: 07/06/2020 20:16   CT MAXILLOFACIAL WO CONTRAST  Result Date: 07/06/2020 CLINICAL DATA:  Fall, bruise to the right side of face and eye EXAM: CT HEAD WITHOUT CONTRAST TECHNIQUE: Contiguous axial images were obtained from the base of the skull through the vertex without intravenous contrast. COMPARISON:  None. FINDINGS: Brain: No evidence of acute territorial infarction, hemorrhage, hydrocephalus,extra-axial collection or mass lesion/mass effect. There is dilatation the ventricles and sulci consistent with age-related atrophy. Low-attenuation changes in the deep white matter consistent with small vessel ischemia. Vascular: No hyperdense vessel or unexpected calcification. Skull: The skull is intact. No fracture or focal lesion identified. Sinuses/Orbits: The visualized paranasal sinuses and mastoid air cells are clear. The orbits and globes intact. Other: Soft tissue swelling seen around the right periorbital region. Face: Osseous: No acute fracture or other significant osseous abnormality.The nasal bone, mandibles, zygomatic arches and  pterygoid plates are intact. Orbits: No fracture identified. Unremarkable appearance of globes and orbits. Sinuses: The visualized paranasal sinuses and mastoid air cells are unremarkable. Soft tissues: Right frontal skull and periorbital soft tissue swelling and a small hematoma is noted. Limited intracranial: No acute findings. IMPRESSION: No acute intracranial abnormality. Findings consistent with age related atrophy and chronic small vessel ischemia No acute facial fracture Small soft tissue hematoma with soft tissue swelling around the right frontal skull and periorbital region.  Electronically Signed   By: Prudencio Pair M.D.   On: 07/06/2020 20:32    Procedures Procedures (including critical care time)  Medications Ordered in ED Medications  oxyCODONE (Oxy IR/ROXICODONE) immediate release tablet 5 mg (has no administration in time range)  fentaNYL (SUBLIMAZE) injection 50 mcg (50 mcg Intravenous Given 07/07/20 0957)  iohexol (OMNIPAQUE) 300 MG/ML solution 100 mL (100 mLs Intravenous Contrast Given 07/07/20 1014)    ED Course  I have reviewed the triage vital signs and the nursing notes.  Pertinent labs & imaging results that were available during my care of the patient were reviewed by me and considered in my medical decision making (see chart for details).    MDM Rules/Calculators/A&P                         Additional history obtained from: 1. Nursing notes from this visit. 2. Electronic medical record. ----------------------------------------- I ordered, reviewed and interpreted labs which include: UDS negative Ammonia level negative, patient does not appear encephalopathic Ethanol negative, patient does not appear in withdrawal. Lipase within normal limits. Urinalysis shows RBCs, leukocytes, negative bacteria/nitrites, not appear as UTI, patient without urinary symptoms. CBC shows baseline anemia of 10.4, no leukocytosis. LFTs slightly elevated appears baseline. BMP shows no emergent electrolyte derangement, AKI or gap.  CT Head/Max Face:  IMPRESSION:  No acute intracranial abnormality.    Findings consistent with age related atrophy and chronic small  vessel ischemia    No acute facial fracture    Small soft tissue hematoma with soft tissue swelling around the  right frontal skull and periorbital region.   CT Cervical Spine:  IMPRESSION:  No evidence of acute cervical spine injury.   CXR:  IMPRESSION:  Mild left pleural effusion with probable underlying atelectasis or  infiltrate.   CT AP:  IMPRESSION:  1. Cirrhosis with portal  hypertension. Moderate ascites and moderate  to large left pleural effusion which has increased from a may 2021  comparison.  2. 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  3. Cholelithiasis and right nephrolithiasis.   DG Right Shoulder:    IMPRESSION:  Mild degenerative joint disease of the right acromioclavicular  joint. No acute abnormality seen in the right shoulder.   DG Right Humerus:  IMPRESSION:  Negative.   DG Right Elbow:  IMPRESSION:  Negative.  - Patient reassessed and resting comfortably no acute distress, vital signs remained stable throughout ER visit, no tachycardia or hypoxia.  She has no respiratory complaints.  Patient reports around 3 weeks ago she had a thoracentesis, she reports that she is going to schedule up an outpatient appointment for another thoracentesis and paracentesis this week.  There is no indication for emergent centesis at this time.  Patient informed of incidental findings as above and states understanding she will discuss with her primary care doctor at follow-up appointment.  Patient reports that she lives at home with her husband and feels safe returning home, she reports that she  has a wheelchair and I have strongly encourage patient to start using that whenever she moves around to avoid further falls given thrombocytopenia and frequent falls.  She reports she currently does not have any home health aides, face-to-face home health aide/PT OT ordered, patient made aware and to keep phone on her for when they call to schedule an appointment.  Additionally patient had multiple small skin tears today, dressing by nursing staff.  Patient is unsure when her last tetanus shot was, offered patient a Tdap today but she refused, I asked patient to follow-up with her primary care doctor to see if her Tdap is up-to-date and to discuss with them immunizations.  At this time there does not appear to be any evidence of an acute emergency medical  condition and the patient appears stable for discharge with appropriate outpatient follow up. Diagnosis was discussed with patient who verbalizes understanding of care plan and is agreeable to discharge. I have discussed return precautions with patient who verbalizes understanding. Patient encouraged to follow-up with their PCP. All questions answered.  Patient's case discussed with Dr. Maryan Rued who agrees with plan to discharge with follow-up.   Note: Portions of this report may have been transcribed using voice recognition software. Every effort was made to ensure accuracy; however, inadvertent computerized transcription errors may still be present. Final Clinical Impression(s) / ED Diagnoses Final diagnoses:  Midline back pain  Fall, initial encounter  Injury of head, initial encounter  Other ascites  Pleural effusion    Rx / DC Orders ED Discharge Orders         [place]        07/07/20 1342    Face-to-face encounter (required for Medicare/Medicaid patients)       Comments: I Deliah Boston certify that this patient is under my care and that I, or a nurse practitioner or physician's assistant working with me, had a face-to-face encounter that meets the physician face-to-face encounter requirements with this patient on 07/07/2020. The encounter with the patient was in whole, or in part for the following medical condition(s) which is the primary reason for home health care (List medical condition): Patient with multiple medical conditions and multiple falls over the past 2 months.  Is supposed to use wheelchair and walker around the home but continues to have falls.   07/07/20 1342           Deliah Boston, PA-C 07/07/20 1400    Blanchie Dessert, MD 07/07/20 1409

## 2020-07-09 ENCOUNTER — Telehealth: Payer: Self-pay | Admitting: Gastroenterology

## 2020-07-09 NOTE — Telephone Encounter (Signed)
Spoke with patient she states that she was told to stop the furosemide. Pt states that she is taking 1.5 tablets (150 mg) spironolactone but no longer taking Furosemide. Will order paracentesis and consult IR about thoracentesis.

## 2020-07-09 NOTE — Telephone Encounter (Signed)
Okay thanks for the update.  I reviewed her chart. She has had a lot of problems with recurrent hypokalemia, admitted in July with pneumonia / pleural effusion, they stopped her lasix. More recently on aldactone monotherapy and her K / renal function is okay. 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 They should send paracentesis peritoneal fluid for cell count, rule out SBP. Pleural fluid should be sent for cell count, albumin / total protein. She will see [place] next week or call sooner in the interim with any worsening. If she is having worsening breathing she needs to contact us or PCP / urgent care for vitals check

## 2020-07-09 NOTE — Telephone Encounter (Signed)
Sorry to hear this. She has had issues with both of these things in the past. She is scheduled to see Jaclyn Shaggy next week, not sure if we have any openings this week or if she can see her PCP to check her vitals and ensure her oxygenation is okay. Can you clarify her diuretic dosing, I think she is on aldactone 219m / day and lasix 468mBID if you can clarify / confirm that. Okay to refer to IR for large volume paracentesis with albumin if they remove > 5 L. 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 If she has breathing difficulty / chest discomfort, etc, and can't wait to do this outpatient she needs to let us[place]

## 2020-07-09 NOTE — Telephone Encounter (Signed)
Spoke with patient, she was seen in the ED on 07/06/20. She stated that she has fluid in her belly and around her lungs. Patient wanting to have paracentesis and pleurocentesis. Pt is aware that Dr. Havery Moros is out of the office today. Please advise, thank you.

## 2020-07-10 ENCOUNTER — Other Ambulatory Visit: Payer: Self-pay

## 2020-07-10 DIAGNOSIS — R188 Other ascites: Secondary | ICD-10-CM

## 2020-07-10 DIAGNOSIS — K7460 Unspecified cirrhosis of liver: Secondary | ICD-10-CM

## 2020-07-10 NOTE — Telephone Encounter (Signed)
Dr. Havery Moros,   I called IR, transferred to [place] and spoke with a PA. He states that they usually do not perform the thoracentesis with a platelet level below 50. He said that they can but the risks would need to be explained to the patient. He stated that if you thought that the benefit outweighed the risks that they would do it. Please advise, thank you.   Scheduled patient for next available appt for paracentesis at [facility] on 07/15/20 at 10 am, patient to arrive at 9:45 am, enter through the [facility] at [facility].  Lm on vm for patient to return call.

## 2020-07-10 NOTE — Telephone Encounter (Signed)
Okay thanks. Let's start with the paracentesis right now and hold off on thoracentesis unless she is having worsening breathing problems. In the interim let's increase aldactone back to 268m and add back lasix at 247m/ day (she was previously on 4084mand see if this can help reduce the fluid. We will need to monitor her BMET closely if she does this, repeat BMET on Monday. If she cannot reliably go to the lab for monitoring while on higher doses of diuretics I would not recommend she do this however

## 2020-07-11 NOTE — Telephone Encounter (Signed)
Spoke with patient she is aware that we will continue with paracentesis and hold off on thoracentesis for now unless she develops worsening breathing problems. Advised patient that if she can reliably go to the lab Dr. Havery Moros wants her to increase Aldactone to 200 mg/ day and add Lasix 20 mg/day. Advised patient we will have to closely monitor her labs since we have increased her diuretics, pt verbalized understanding of medication adjustments and the need to come in on Monday for labs. Pt is aware of paracentesis appt at [facility] on 07/15/20 at 10 am, with a 9:45 am arrival time. Pt verbalized understanding of all instructions. Advised to call office if she had any concerns.

## 2020-07-11 NOTE — Telephone Encounter (Signed)
Lm on vm for patient to return call. Lab order and reminder in epic.

## 2020-07-15 ENCOUNTER — Telehealth: Payer: Self-pay

## 2020-07-15 ENCOUNTER — Ambulatory Visit (HOSPITAL_COMMUNITY): Admission: RE | Admit: 2020-07-15 | Payer: Medicare HMO | Source: Ambulatory Visit

## 2020-07-15 ENCOUNTER — Telehealth: Payer: Self-pay | Admitting: Gastroenterology

## 2020-07-15 NOTE — Telephone Encounter (Signed)
Patient called states she received a message that her appointment is changed calling to find out more information

## 2020-07-15 NOTE — Telephone Encounter (Signed)
Spoke with patient, she is wanting to reschedule her paracentesis for next week, she states that she had a fall and injured her ribs. Pt states that she will keep office appt. Patient is rescheduled for her paracentesis at [facility] on 07/23/20 at 9 am, with an 8:45 am arrival time. Patient is aware of appt

## 2020-07-15 NOTE — Telephone Encounter (Signed)
Left detailed message reminding patient that she will need to come in for labs today so that we can closely monitor her labs after increasing her diuretics. Pt advised that lab is open until 5 pm today, no appt necessary. Advised patient to give me a call back if she had any questions.

## 2020-07-15 NOTE — Telephone Encounter (Signed)
-----   Message from Yevette Edwards, RN sent at 07/10/2020 12:02 PM EDT ----- Regarding: LABS BMET today, order in epic

## 2020-07-16 ENCOUNTER — Other Ambulatory Visit (INDEPENDENT_AMBULATORY_CARE_PROVIDER_SITE_OTHER): Payer: Medicare HMO

## 2020-07-16 DIAGNOSIS — G8929 Other chronic pain: Secondary | ICD-10-CM | POA: Diagnosis not present

## 2020-07-16 DIAGNOSIS — R188 Other ascites: Secondary | ICD-10-CM

## 2020-07-16 DIAGNOSIS — K7460 Unspecified cirrhosis of liver: Secondary | ICD-10-CM

## 2020-07-16 DIAGNOSIS — M7062 Trochanteric bursitis, left hip: Secondary | ICD-10-CM | POA: Diagnosis not present

## 2020-07-16 DIAGNOSIS — M25561 Pain in right knee: Secondary | ICD-10-CM | POA: Diagnosis not present

## 2020-07-16 DIAGNOSIS — Z79899 Other long term (current) drug therapy: Secondary | ICD-10-CM | POA: Diagnosis not present

## 2020-07-16 LAB — BASIC METABOLIC PANEL
BUN: 11 mg/dL (ref 6–23)
CO2: 31 mEq/L (ref 19–32)
Calcium: 8 mg/dL — ABNORMAL LOW (ref 8.4–10.5)
Chloride: 98 mEq/L (ref 96–112)
Creatinine, Ser: 0.7 mg/dL (ref 0.40–1.20)
GFR: 84.84 mL/min (ref >60.00)
Glucose, Bld: 125 mg/dL — ABNORMAL HIGH (ref 70–99)
Potassium: 2.8 mEq/L — CL (ref 3.5–5.1)
Sodium: 138 mEq/L (ref 135–145)

## 2020-07-17 ENCOUNTER — Ambulatory Visit: Payer: Medicare HMO | Admitting: Nurse Practitioner

## 2020-07-17 ENCOUNTER — Other Ambulatory Visit: Payer: Self-pay | Admitting: Gastroenterology

## 2020-07-17 DIAGNOSIS — K7460 Unspecified cirrhosis of liver: Secondary | ICD-10-CM

## 2020-07-17 DIAGNOSIS — R188 Other ascites: Secondary | ICD-10-CM

## 2020-07-22 ENCOUNTER — Telehealth: Payer: Self-pay

## 2020-07-22 NOTE — Telephone Encounter (Signed)
Patient called states that pain has increased, pt requesting a sooner appt. Pt rescheduled to 07/24/20 at 3 pm with Carl Best, NP. Pt has paracentesis tomorrow. Advised patient that if her pain is intolerable she needs to go to the ED for further evaluation. Pt verbalized understanding.

## 2020-07-23 ENCOUNTER — Ambulatory Visit (HOSPITAL_COMMUNITY): Admission: RE | Admit: 2020-07-23 | Payer: Medicare HMO | Source: Ambulatory Visit

## 2020-07-24 ENCOUNTER — Ambulatory Visit: Payer: Medicare HMO | Admitting: Nurse Practitioner

## 2020-07-29 ENCOUNTER — Ambulatory Visit: Payer: Medicare HMO | Admitting: Neurology

## 2020-07-29 NOTE — Progress Notes (Deleted)
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  History supplemented by hospital and referring provider's notes.  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  ***, she has had frequent falls and has sustained multiple fractures, including sacral fracture, ***.  ***.  She is compliant with her lactulose and ***.  She was most recently hospitalized earlier this month for *** in which she reports that she was standing in the kitchen and her legs just gave out.  CT head and cervical spine personally reviewed showed no fractures or other acute abnormality.      10/31/2017 MRI LUMBAR SPINE:  1. Acute complex sacral fracture with impaction and posterior displacement of S2 segment. Posterior displaced S2 body narrows the sacral canal, however, it is patent. Possible mass effect on descending cauda equina at S2 from the narrow canal and acute edema.  2. Fracture lines extend S1 and S2 neural foramen. The neural foramen are patent.  3. Mild lumbar spondylosis. No significant foraminal or canal stenosis.  PAST MEDICAL HISTORY: Past Medical History:  Diagnosis Date  . Anxiety    pt. denies  . Arthritis   . Cirrhosis of liver ([place])   . Complication of anesthesia    ileus after surgery 2018  . Depression    "not all the time"  . Heart murmur    Echo 10/06/17 [facility]): Normal LV/RV size, basal septal hypertophy-normal variant, LVEF 65-70%, normal LV/RV function, est RAP 5 mmHg, no sign valvular stenosis or regurg--trace MR/TR/PR  . History of abdominal paracentesis   . History of blood transfusion   . Hypothyroidism   . Ileus ([place])   . Liver disease  2008  . Neuropathy   . Osteoporosis 02/09/2018  . Pancytopenia ([place])   . Pneumonia    2019ish  . Thrombocytopenia ([place]) 12/01/2019  . Vaginal Pap smear, abnormal     PAST SURGICAL HISTORY: Past Surgical History:  Procedure Laterality Date  . AUGMENTATION MAMMAPLASTY Bilateral    20 years ago  . BIOPSY  03/15/2018   Procedure: BIOPSY;  Surgeon: Yetta Flock, MD;  Location: [facility];  Service: Gastroenterology;;  Gastric  . COLONOSCOPY WITH ESOPHAGOGASTRODUODENOSCOPY (EGD) AND ESOPHAGEAL DILATION (ED)    . COLONOSCOPY WITH PROPOFOL N/A 03/15/2018   Procedure: COLONOSCOPY WITH PROPOFOL;  Surgeon: Yetta Flock, MD;  Location: [facility];  Service: Gastroenterology;  Laterality: N/A;  . ESOPHAGOGASTRODUODENOSCOPY (EGD) WITH PROPOFOL N/A 03/15/2018   Procedure: ESOPHAGOGASTRODUODENOSCOPY (EGD) WITH PROPOFOL;  Surgeon: Yetta Flock, MD;  Location: [facility];  Service: Gastroenterology;  Laterality: N/A;  . ESOPHAGOGASTRODUODENOSCOPY W/ BANDING     Varices  . FRACTURE SURGERY Left    x 2 L elbow and hip left   . HARDWARE REMOVAL Left 09/17/2017   Procedure: HARDWARE REMOVAL LEFT OLECRANON;  Surgeon: Altamese Nipinnawasee, MD;  Location: [facility];  Service: Orthopedics;  Laterality: Left;  . HIP SURGERY Left    from fracture- rod in palce  . IR PARACENTESIS  11/18/2017  . IR PARACENTESIS  11/26/2017  . IR PARACENTESIS  03/14/2018  . KNEE SURGERY Right    open incision for ligaments  . ORIF ELBOW FRACTURE Left 04/30/2017   Procedure: REVISION OPEN REDUCTION INTERNAL  FIXATION (ORIF) ELBOW/OLECRANON FRACTURE;  Surgeon: Altamese Coaling, MD;  Location: [place];  Service: Orthopedics;  Laterality: Left;  . REPAIR EXTENSOR TENDON Left 12/29/2019   Procedure: LEFT LONG AND LEFT RING FRINGER EXTENSOR REALIGNAMENT WITH POSSIBLE TENDON TRANSFER;  Surgeon: Charlotte Crumb, MD;  Location: [place];  Service: Orthopedics;  Laterality: Left;    MEDICATIONS: Current Outpatient Medications  on File Prior to Visit  Medication Sig Dispense Refill  . denosumab (PROLIA) 60 MG/ML SOSY injection Inject 60 mg into the skin every 6 (six) months.    . diclofenac Sodium (VOLTAREN) 1 % GEL Apply 2 g topically daily as needed (for pain).     . furosemide (LASIX) 80 MG tablet Take 0.5 tablets (40 mg total) by mouth daily. 30 tablet 5  . levOCARNitine (CARNITOR) 330 MG tablet Take 330 mg by mouth 3 (three) times daily.    Marland Kitchen levothyroxine (SYNTHROID, LEVOTHROID) 75 MCG tablet Take 1 tablet (75 mcg total) by mouth daily before breakfast. 30 tablet 0  . oxyCODONE (OXY IR/ROXICODONE) 5 MG immediate release tablet Take 1 tablet (5 mg total) by mouth every 12 (twelve) hours as needed for severe pain. 10 tablet 0  . Potassium Chloride ER 20 MEQ TBCR Take 20 mEq by mouth every Tuesday, Thursday, Saturday, and Sunday. 20 tablet 1  . rifaximin (XIFAXAN) 550 MG TABS tablet Take 1 tablet (550 mg total) by mouth 2 (two) times daily. 60 tablet 5  . sertraline (ZOLOFT) 50 MG tablet Take 50 mg by mouth daily.    Marland Kitchen spironolactone (ALDACTONE) 100 MG tablet Take 1.5 tablets (150 mg total) by mouth daily. 45 tablet 5  . traZODone (DESYREL) 50 MG tablet Take 50 mg by mouth at bedtime as needed for sleep.      No current facility-administered medications on file prior to visit.    ALLERGIES: Allergies  Allergen Reactions  . Penicillins Swelling and Other (See Comments)    FACIAL SWELLING Did it involve swelling of the face/tongue/throat, SOB, or low BP? Yes Did it involve sudden or severe rash/hives, skin peeling, or any reaction on the inside of your mouth or nose? No Did you need to seek medical attention at a hospital or doctor's office? Unknown When did it last happen?30 years If all above answers are "NO", may proceed with cephalosporin use.   . Tylenol [Acetaminophen] Nausea Only  . Sulfa Antibiotics Itching and Rash  . Tramadol Swelling and Rash    FAMILY HISTORY: Family History  Adopted:  Yes  Problem Relation Age of Onset  . Alcohol abuse Daughter    ***.  SOCIAL HISTORY: Social History   Socioeconomic History  . Marital status: Married    Spouse name: Not on file  . Number of children: 2  . Years of education: Not on file  . Highest education level: Not on file  Occupational History  . Occupation: disabled  Tobacco Use  . Smoking status: Never Smoker  . Smokeless tobacco: Never Used  Vaping Use  . Vaping Use: Never used  Substance and Sexual Activity  . Alcohol use: No  . Drug use: No  . Sexual activity: Not Currently    Birth control/protection: Post-menopausal  Other Topics Concern  . Not on file  Social History Narrative  . Not on file   Social Determinants of Health   Financial Resource Strain:   . Difficulty of Paying Living Expenses: Not on file  Food Insecurity:   . Worried About Charity fundraiser in  the Last Year: Not on file  . Ran Out of Food in the Last Year: Not on file  Transportation Needs:   . Lack of Transportation (Medical): Not on file  . Lack of Transportation (Non-Medical): Not on file  Physical Activity:   . Days of Exercise per Week: Not on file  . Minutes of Exercise per Session: Not on file  Stress:   . Feeling of Stress : Not on file  Social Connections:   . Frequency of Communication with Friends and Family: Not on file  . Frequency of Social Gatherings with Friends and Family: Not on file  . Attends Religious Services: Not on file  . Active Member of Clubs or Organizations: Not on file  . Attends Archivist Meetings: Not on file  . Marital Status: Not on file  Intimate Partner Violence:   . Fear of Current or Ex-Partner: Not on file  . Emotionally Abused: Not on file  . Physically Abused: Not on file  . Sexually Abused: Not on file    REVIEW OF SYSTEMS: Constitutional: No fevers, chills, or sweats, no generalized fatigue, change in appetite Eyes: No visual changes, double vision, eye pain Ear,  nose and throat: No hearing loss, ear pain, nasal congestion, sore throat Cardiovascular: No chest pain, palpitations Respiratory:  No shortness of breath at rest or with exertion, wheezes GastrointestinaI: No nausea, vomiting, diarrhea, abdominal pain, fecal incontinence Genitourinary:  No dysuria, urinary retention or frequency Musculoskeletal:  No neck pain, back pain Integumentary: No rash, pruritus, skin lesions Neurological: as above Psychiatric: No depression, insomnia, anxiety Endocrine: No palpitations, fatigue, diaphoresis, mood swings, change in appetite, change in weight, increased thirst Hematologic/Lymphatic:  No purpura, petechiae. Allergic/Immunologic: no itchy/runny eyes, nasal congestion, recent allergic reactions, rashes  PHYSICAL EXAM: *** General: No acute distress.  Patient appears ***-groomed.  *** Head:  Normocephalic/atraumatic Eyes:  fundi examined but not visualized Neck: supple, no paraspinal tenderness, full range of motion Back: No paraspinal tenderness Heart: regular rate and rhythm Lungs: Clear to auscultation bilaterally. Vascular: No carotid bruits. Neurological Exam: Mental status: alert and oriented to person, place, and time, recent and remote memory intact, fund of knowledge intact, attention and concentration intact, speech fluent and not dysarthric, language intact. Cranial nerves: CN I: not tested CN II: pupils equal, round and reactive to light, visual fields intact CN III, IV, VI:  full range of motion, no nystagmus, no ptosis CN V: facial sensation intact CN VII: upper and lower face symmetric CN VIII: hearing intact CN IX, X: gag intact, uvula midline CN XI: sternocleidomastoid and trapezius muscles intact CN XII: tongue midline Bulk & Tone: normal, no fasciculations. Motor:  5/5 throughout *** Sensation:  Pinprick *** temperature *** and vibration sensation intact.  ***. Deep Tendon Reflexes:  2+ throughout, *** toes downgoing.   *** Finger to nose testing:  Without dysmetria.  *** Heel to shin:  Without dysmetria.  *** Gait:  Normal station and stride.  Able to turn and tandem walk. [place] ***.  IMPRESSION: ***  PLAN: ***  Thank you for allowing me to take part in the care of this patient.  Metta Clines, DO  CC: ***

## 2020-08-02 ENCOUNTER — Ambulatory Visit: Payer: Medicare HMO | Admitting: Nurse Practitioner

## 2020-08-02 ENCOUNTER — Telehealth: Payer: Self-pay | Admitting: Nurse Practitioner

## 2020-08-02 NOTE — Telephone Encounter (Signed)
This is really unfortunate. She is also overdue for labs following very low K which I adjusted her diuretics, etc. Eustaquio Maize can you please have her go to the lab as soon as she is able to, later today vs. Monday. She needs an office follow up as well as outlined by Jaclyn Shaggy. Thanks

## 2020-08-02 NOTE — Progress Notes (Deleted)
08/02/2020 Megan Roberson [id number] 07/18/1956   Chief Complaint:  History of Present Illness: Megan Roberson is a 64 year old female with a past medical history of anxiety, depression hypothyroidism and decompensated alcoholic cirrhosis with ascites, hepatic hydrothorax, hepatic encephalopathy and bleeding varices. She has been followed by the hepatology transplant clinic in [facility] by Dr. Zollie Scale.  Due to her comorbidities and multiple falls, fragility, has not been worked up for transplant yet. She was last seen in our office by Dr. Havery Moros on 12/01/2019.   EGD was in May 2019 and she had trace varices as well as gastritis and esophageal candidiasis.  She has not been on beta-blockade due to her ascites which has been difficult to manage.  She also has severe chronic thrombocytopenia with platelets of the 30s.  She required Doptelet for her last EGD.  She has not been thought to be a good candidate for TIPS in the past due to her encephalopathy.    In the ED 07/06/2020 had a paracentesis done???  Abdominal/pelvic CT 07/07/2020: 1. Cirrhosis with portal hypertension. Moderate ascites and moderate to large left pleural effusion which has increased from a may 2021 comparison. 2. 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 3. Cholelithiasis and right nephrolithiasis.   S/P paracentesis for left pleural effusion 05/19/2020: Successful ultrasound guided LEFT thoracentesis yielding 430 mL of pleural fluid  EGD 03/15/2018:  - Trace to small esophageal varices without high risk stigmata - banding not performed in light of small size and thrombocytopenia. - Multiple white plaques in the esophagus. Brushings performed to rule out candidiasis. - Non-bleeding small gastric ulcer with no stigmata of bleeding. - Gastritis. Biopsied to rule out H pylori - Normal duodenal bulb and second portion of the duodenum.,   Colonoscopy 03/15/2018: The perianal and digital rectal  examinations were normal. The colon was redundant with severe looping. The entire colon was severely congested / edematous, consistent with changes due to portal hypertension. Most severe in the sigmoid colon which made visualization difficut. Due to severe looping / redundancy / edema, initial attempt at cecal intubation with pediatric colonoscope was not succesfull. The scope was exchanged for an adult colonoscope. The suspected ascending colon was reached but cecal intubation was not successful despite multiple attempts / abdominal pressure. Internal hemorrhoids were found during retroflexion.  CT scan abdomen 07/07/2018:  multiple vertebral fractures, fracture of right rib, remote pelvic fractures, "inflammation of the ascending colon", R adnexal cystic lesion  Virtual colonoscopy 04/24/19: No significant colonic polyp, mass, apple core lesion, or stricture. Cirrhosis. Splenomegaly. Cholelithiasis. 14 mm nonobstructing right lower pole renal calculus, without hydronephrosis. 4.6 cm right ovarian cystic lesion, better evaluated on recent pelvic MRI.  RUQ ultrasound 07/07/2019: Findings consistent with hepatic cirrhosis. Cholelithiasis is noted without evidence of cholecystitis  RUQ ultrasound 01/09/2020: 1. Cholelithiasis without sonographic evidence of acute cholecystitis. 2. Cirrhotic liver without focal liver lesion identified. 3. In the region of the distal stomach there appears to be a soft tissue density measuring approximately 3.8 cm with internal vascularity. This finding is incompletely assessed with ultrasound. Further evaluation with contrast-enhanced CT of the abdomen is Recommended.  Abdominal CT with contrast 03/23/2020: Stomach is within normal limits. Specifically, the distal gastric antrum (imaged on prior right upper quadrant ultrasound) is normal, without wall thickening or mass. Cirrhosis.  No findings suspicious for HCC. Portal vein is patent. Mild  abdominal ascites. Mild perigastric varices with recanalized periumbilical vein. Splenomegaly. Small to moderate left pleural effusion. Cholelithiasis, without  associated inflammatory changes. Please note that the pelvis was not imaged.  RUQ ultrasound 05/20/2020: Scant ascites scattered throughout abdomen. Volume of ascites is insufficient for paracentesis. Insufficient ascites for paracentesis   Current Medications, Allergies, Past Medical History, Past Surgical History, Family History and Social History were reviewed in [employer] record.   Review of Systems:   Constitutional: Negative for fever, sweats, chills or weight loss.  Respiratory: Negative for shortness of breath.   Cardiovascular: Negative for chest pain, palpitations and leg swelling.  Gastrointestinal: See HPI.  Musculoskeletal: Negative for back pain or muscle aches.  Neurological: Negative for dizziness, headaches or paresthesias.    Physical Exam: There were no vitals taken for this visit. General: Well developed, w   ***female in no acute distress. Head: Normocephalic and atraumatic. Eyes: No scleral icterus. Conjunctiva pink . Ears: Normal auditory acuity. Mouth: Dentition intact. No ulcers or lesions.  Lungs: Clear throughout to auscultation. Heart: Regular rate and rhythm, no murmur. Abdomen: Soft, nontender and nondistended. No masses or hepatomegaly. Normal bowel sounds x 4 quadrants.  Rectal: *** Musculoskeletal: Symmetrical with no gross deformities. Extremities: No edema. Neurological: Alert oriented x 4. No focal deficits.  Psychological: Alert and cooperative. Normal mood and affect  Assessment and Recommendations:   43. 64 year old female with decompensated alcoholic cirrhosis with ascites and esophageal varices   2. Hepatic Hydrothorax  3. Hepatic encephalopathy   4. Anemia   5. Thrombocytopenia   6. Gastric ulcer per EGD 2019  7. Recurrent falls

## 2020-08-02 NOTE — Telephone Encounter (Signed)
Megan Roberson, this pt cancelled her appt scheduled with you at 11am due to transportation falling through

## 2020-08-02 NOTE — Telephone Encounter (Signed)
Spoke with patient, she is scheduled to see Tye Savoy on 08/06/20 at 8:30 am, pt is agreeable with this date and time. She states that she does not think she will be able to get by the lab today. Advised patient that she will need to go by the lab as soon as she can since her dosing of diuretics have been changed and we have to closely monitor her potassium. Pt verbalized understanding.

## 2020-08-02 NOTE — Telephone Encounter (Signed)
Megan Roberson, patient canceled her appt with me today due to transportation issues. Pls call patient later today or early Monday to reschedule her office follow up appointment asap with me, Dr. Havery Moros or any APP as she has complex decompensated cirrhosis. THX

## 2020-08-05 ENCOUNTER — Other Ambulatory Visit (INDEPENDENT_AMBULATORY_CARE_PROVIDER_SITE_OTHER): Payer: Medicare HMO

## 2020-08-05 DIAGNOSIS — K7460 Unspecified cirrhosis of liver: Secondary | ICD-10-CM

## 2020-08-05 DIAGNOSIS — R188 Other ascites: Secondary | ICD-10-CM

## 2020-08-05 LAB — BASIC METABOLIC PANEL
BUN: 15 mg/dL (ref 6–23)
CO2: 24 mEq/L (ref 19–32)
Calcium: 8.1 mg/dL — ABNORMAL LOW (ref 8.4–10.5)
Chloride: 103 mEq/L (ref 96–112)
Creatinine, Ser: 0.59 mg/dL (ref 0.40–1.20)
GFR: 103.32 mL/min (ref >60.00)
Glucose, Bld: 84 mg/dL (ref 70–99)
Potassium: 3.5 mEq/L (ref 3.5–5.1)
Sodium: 136 mEq/L (ref 135–145)

## 2020-08-06 ENCOUNTER — Ambulatory Visit (INDEPENDENT_AMBULATORY_CARE_PROVIDER_SITE_OTHER): Payer: Medicare HMO | Admitting: Nurse Practitioner

## 2020-08-06 ENCOUNTER — Encounter: Payer: Self-pay | Admitting: Nurse Practitioner

## 2020-08-06 VITALS — BP 90/50 | HR 84 | Ht 67.0 in | Wt 121.0 lb

## 2020-08-06 DIAGNOSIS — R110 Nausea: Secondary | ICD-10-CM

## 2020-08-06 DIAGNOSIS — K7460 Unspecified cirrhosis of liver: Secondary | ICD-10-CM | POA: Diagnosis not present

## 2020-08-06 DIAGNOSIS — K7290 Hepatic failure, unspecified without coma: Secondary | ICD-10-CM

## 2020-08-06 MED ORDER — ONDANSETRON 4 MG PO TBDP: 4.0000 mg | ORAL_TABLET | Freq: Three times a day (TID) | ORAL | 3 refills | Status: DC | PRN

## 2020-08-06 MED ORDER — OMEPRAZOLE 20 MG PO CPDR: 20.0000 mg | DELAYED_RELEASE_CAPSULE | Freq: Every day | ORAL | 5 refills | Status: DC

## 2020-08-06 MED ORDER — TRAZODONE HCL 50 MG PO TABS: 50.0000 mg | ORAL_TABLET | Freq: Every evening | ORAL | 2 refills | Status: DC | PRN

## 2020-08-06 NOTE — Patient Instructions (Signed)
If you are age 62 or older, your body mass index should be between 23-30. Your Body mass index is 18.95 kg/m. If this is out of the aforementioned range listed, please consider follow up with your Primary Care Provider.  If you are age 3 or younger, your body mass index should be between 19-25. Your Body mass index is 18.95 kg/m. If this is out of the aformentioned range listed, please consider follow up with your Primary Care Provider.   We have sent the following medications to your pharmacy for you to pick up at your convenience: Zofran 4 mg every 8 hours as needed. Omeprazole 20 mg daily.  Trazadone 50 mg nightly.  You have been scheduled for an abdominal paracentesis at [facility] radiology (1st floor of hospital) on Wednesday 08/07/20 at 9:10 am. Please arrive at least 15 minutes prior to your appointment time for registration. Should you need to reschedule this appointment for any reason, please call our office at [phone].

## 2020-08-06 NOTE — Progress Notes (Signed)
Agree with assessment as outlined. I have been in touch with her but she has not been seen since January due to a multitude of issues.  Fortunately despite them not following instructions on diuretics, her K is stable on aldactone monotherapy and K supplementation which is surprising. She has had trouble with hypokalemia with lasix in the past. I think okay to continue present regimen for now, but would repeat a BMET in 2 weeks to ensure this stays stable and she does not develop hyperkalemia.  Otherwise I do think she is due for a surveillance endoscopy for varices. She has had thrombocytopenia, not a good candidate for beta blockade due to ascites. She is quite frail, will see how she is doing next month and I would also like to see her back myself for a routine followup. Thanks

## 2020-08-06 NOTE — Progress Notes (Signed)
ASSESSMENT AND PLAN     # Decompensated cirrhosis.  --Due to a misunderstanding she has actually been OFF lasix since mid September. Taking Aldactone 150 mg daily.  While she does appear to have ascites, her abdomen is not tense, she feels like degree of ascites has decreased, and she has no pedal edema.  He weight is down from 140 pounds on 06/04/20 to 121 pounds today.   I'm going to hold off on resuming lasix right now.  --Continue Aldactone 150 mg daily. Continue K+ supplement TID. Even off lasix her potassium is at the lower end of normal.  --Given abdominal pain will arrange for paracentesis. If > 4 liters then needs IV albumin. Fluid studies to rule out SBP. Marland Kitchen Also check cytology --She has a known left pleural effusion, possibly hepatic hydrothorax though those are more commonly right sided. We haven't ordered thoracentesis due to thrombocytopenia. She isn't short of breath at rest so will hold off on thoracentesis.   --2 gram sodium diet.  --Normal AFP in Jan 2021. No liver lesions on contrasted CT scan last month --She is requesting a note for insurance company to get home care as husband is often not home due to work. I will defer to Dr. Havery Moros on this --She needs close follow up . Keep follow up appt with Carl Best, PA at the end of this month  # Nausea with occasional vomiting --Zofran helps, will refill per request   # GERD with pyrosis --Omeprazole 20 mg daily helps. Will refill per request   # Insomnia  --Sleeps only a few hours at night and doesn't nap during the day. Trazadone helps but ran out of medication. Will refill for her.    HISTORY OF PRESENT ILLNESS     Primary Gastroenterologist : [name], MD  Chief Complaint : cirrhosis   Megan Roberson is a 64 y.o. female with PMH / [place] significant for,  but not necessarily limited to: NASH / etoh cirrhosis  Patient known to Dr. Havery Moros for cirrhosis with ascites, hepatic encephalopathy  and bleeding varices.  Though she hasn't been seen in the office for several months and missed recent appt ( transportation problems), there have been numerous telephone encounters addressing diuretics, abdominal pain, paracentesis, etc. This is my first seeing patient.   Patient's potassium on 07/16/20 was low, we held lasix (over the phone) for a couple of days, increased aldactone to 150 mg daily and her potassium was repleted. She was not able to get back here for follow up labs until just yesterday. Her K+ is now 3.5, Renal function remains normal. She had an appointment to get a paracentesis but due to a fall and weakness she wasn't able to get it done.  She does feel like the abdominal swelling is a little better.   Regarding diuretics, husband misunderstood instructions to hold lasix for two days after 9/14 labs and has been holding lasix since then.  She is taking K+ pills TID and on aldactone 150 mg daily. She gets [facility] with exertion but breathing okay at rest    [place] complains of crampy right back pain radiating around to her right abdomen unrelated to bowel movements or eating. No fevers. She is having one BM a day. Takes Miralax once a week. She doesn't like the taste of lactulose but she is taking xifaxan. Her main complaint is that of weakness. It is even hard for her to make it to the bathroom which is  another reason why she doesn't take the lactulose. Her husband tries to encourage her to eat but appetite is diminished and she has frequent nausea with occasional vomiting. Zofran helps.  He encourages her to take protein shakes to supplement diet. He tries to make sure she follows a low sodium diet    Past Medical History:  Diagnosis Date  . Anxiety    pt. denies  . Arthritis   . Cirrhosis of liver ([place])   . Complication of anesthesia    ileus after surgery 2018  . Depression    "not all the time"  . Heart murmur    Echo 10/06/17 [facility]): Normal LV/RV size, basal septal  hypertophy-normal variant, LVEF 65-70%, normal LV/RV function, est RAP 5 mmHg, no sign valvular stenosis or regurg--trace MR/TR/PR  . History of abdominal paracentesis   . History of blood transfusion   . Hypothyroidism   . Ileus ([place])   . Liver disease 2008  . Neuropathy   . Osteoporosis 02/09/2018  . Pancytopenia ([place])   . Pneumonia    2019ish  . Thrombocytopenia ([place]) 12/01/2019  . Vaginal Pap smear, abnormal     Current Medications, Allergies, Past Surgical History, Family History and Social History were reviewed in [employer] record.   Current Outpatient Medications  Medication Sig Dispense Refill  . denosumab (PROLIA) 60 MG/ML SOSY injection Inject 60 mg into the skin every 6 (six) months.    . diclofenac Sodium (VOLTAREN) 1 % GEL Apply 2 g topically daily as needed (for pain).     . furosemide (LASIX) 80 MG tablet Take 0.5 tablets (40 mg total) by mouth daily. 30 tablet 5  . levOCARNitine (CARNITOR) 330 MG tablet Take 330 mg by mouth 3 (three) times daily.    Marland Kitchen levothyroxine (SYNTHROID, LEVOTHROID) 75 MCG tablet Take 1 tablet (75 mcg total) by mouth daily before breakfast. 30 tablet 0  . oxyCODONE (OXY IR/ROXICODONE) 5 MG immediate release tablet Take 1 tablet (5 mg total) by mouth every 12 (twelve) hours as needed for severe pain. 10 tablet 0  . Potassium Chloride ER 20 MEQ TBCR Take 20 mEq by mouth every Tuesday, Thursday, Saturday, and Sunday. 20 tablet 1  . rifaximin (XIFAXAN) 550 MG TABS tablet Take 1 tablet (550 mg total) by mouth 2 (two) times daily. 60 tablet 5  . sertraline (ZOLOFT) 50 MG tablet Take 50 mg by mouth daily.    Marland Kitchen spironolactone (ALDACTONE) 100 MG tablet Take 1.5 tablets (150 mg total) by mouth daily. 45 tablet 5  . traZODone (DESYREL) 50 MG tablet Take 50 mg by mouth at bedtime as needed for sleep.      No current facility-administered medications for this visit.    Review of Systems: No chest pain. No urinary complaints.    PHYSICAL EXAM :    Wt Readings from Last 3 Encounters:  06/04/20 140 lb (63.5 kg)  05/19/20 138 lb 3.7 oz (62.7 kg)  12/29/19 145 lb (65.8 kg)    BP (!) 90/50   Pulse 84   Ht 5' 7"  (1.702 m)   Wt 121 lb (54.9 kg)   BMI 18.95 kg/m  Constitutional:  Pleasant female in no acute distress. Psychiatric: Normal mood and affect. Behavior is normal. EENT: Pupils normal.  Conjunctivae are normal. No scleral icterus. Neck supple.  Cardiovascular: Normal rate, regular rhythm. No edema Pulmonary/chest: Effort normal and breath sounds normal. No wheezing, rales or rhonchi. Abdominal: Examined in wheelchair. Soft, moderately distended, diffusely tender.  Bowel sounds active throughout Neurological: Alert and oriented to person place and time. Skin: Skin is warm and dry. No rashes noted.  Tye Savoy, NP  08/06/2020, 8:39 AM

## 2020-08-07 ENCOUNTER — Ambulatory Visit (HOSPITAL_COMMUNITY): Payer: Medicare HMO

## 2020-08-08 ENCOUNTER — Other Ambulatory Visit: Payer: Self-pay

## 2020-08-08 ENCOUNTER — Ambulatory Visit (HOSPITAL_COMMUNITY)
Admission: RE | Admit: 2020-08-08 | Discharge: 2020-08-08 | Disposition: A | Discharge status: Home / Self Care | Payer: Medicare HMO | Source: Ambulatory Visit | Attending: Nurse Practitioner | Admitting: Nurse Practitioner

## 2020-08-08 DIAGNOSIS — R188 Other ascites: Secondary | ICD-10-CM | POA: Diagnosis not present

## 2020-08-08 DIAGNOSIS — K7460 Unspecified cirrhosis of liver: Secondary | ICD-10-CM | POA: Diagnosis not present

## 2020-08-08 DIAGNOSIS — K7290 Hepatic failure, unspecified without coma: Secondary | ICD-10-CM | POA: Insufficient documentation

## 2020-08-08 HISTORY — PX: IR PARACENTESIS: IMG2679

## 2020-08-08 LAB — GRAM STAIN

## 2020-08-08 LAB — BODY FLUID CELL COUNT WITH DIFFERENTIAL
Eos, Fluid: 0 %
Lymphs, Fluid: 38 %
Monocyte-Macrophage-Serous Fluid: 47 % — ABNORMAL LOW (ref 50–90)
Neutrophil Count, Fluid: 15 % (ref 0–25)
Total Nucleated Cell Count, Fluid: 58 cu mm (ref 0–1000)

## 2020-08-08 MED ORDER — LIDOCAINE HCL 1 % IJ SOLN
INTRAMUSCULAR | Status: AC
  Filled 2020-08-08: qty 20

## 2020-08-08 MED ORDER — LIDOCAINE HCL (PF) 1 % IJ SOLN
INTRAMUSCULAR | Status: AC | PRN
  Administered 2020-08-08: 10 mL

## 2020-08-08 NOTE — Procedures (Signed)
PROCEDURE SUMMARY:  Successful image-guided paracentesis from the right lower abdomen.  Yielded 3.3 liters of clear, light yellow fluid.  No immediate complications.  EBL: zero Patient tolerated well.   Specimen was sent for labs.  Please see imaging section of Epic for full dictation.  Joaquim Nam PA-C 08/08/2020 1:34 PM

## 2020-08-09 LAB — CYTOLOGY - NON PAP

## 2020-08-13 ENCOUNTER — Other Ambulatory Visit: Payer: Self-pay

## 2020-08-13 ENCOUNTER — Encounter (HOSPITAL_COMMUNITY): Payer: Self-pay | Admitting: *Deleted

## 2020-08-13 ENCOUNTER — Emergency Department (HOSPITAL_COMMUNITY): Payer: Medicare HMO

## 2020-08-13 ENCOUNTER — Inpatient Hospital Stay (HOSPITAL_COMMUNITY)
Admission: EM | Admit: 2020-08-13 | Discharge: 2020-08-21 | DRG: 441 | Disposition: A | Discharge status: Skilled Nursing Facility | Payer: Medicare HMO | Attending: Family Medicine | Admitting: Family Medicine

## 2020-08-13 DIAGNOSIS — F39 Unspecified mood [affective] disorder: Secondary | ICD-10-CM | POA: Diagnosis not present

## 2020-08-13 DIAGNOSIS — Z9119 Patient's noncompliance with other medical treatment and regimen: Secondary | ICD-10-CM

## 2020-08-13 DIAGNOSIS — Z886 Allergy status to analgesic agent status: Secondary | ICD-10-CM

## 2020-08-13 DIAGNOSIS — Z882 Allergy status to sulfonamides status: Secondary | ICD-10-CM

## 2020-08-13 DIAGNOSIS — R8271 Bacteriuria: Secondary | ICD-10-CM | POA: Insufficient documentation

## 2020-08-13 DIAGNOSIS — D638 Anemia in other chronic diseases classified elsewhere: Secondary | ICD-10-CM | POA: Diagnosis present

## 2020-08-13 DIAGNOSIS — R188 Other ascites: Secondary | ICD-10-CM | POA: Diagnosis present

## 2020-08-13 DIAGNOSIS — D61818 Other pancytopenia: Secondary | ICD-10-CM | POA: Diagnosis present

## 2020-08-13 DIAGNOSIS — Z20822 Contact with and (suspected) exposure to covid-19: Secondary | ICD-10-CM | POA: Diagnosis present

## 2020-08-13 DIAGNOSIS — N136 Pyonephrosis: Secondary | ICD-10-CM | POA: Diagnosis present

## 2020-08-13 DIAGNOSIS — E43 Unspecified severe protein-calorie malnutrition: Secondary | ICD-10-CM | POA: Diagnosis present

## 2020-08-13 DIAGNOSIS — E8809 Other disorders of plasma-protein metabolism, not elsewhere classified: Secondary | ICD-10-CM | POA: Diagnosis not present

## 2020-08-13 DIAGNOSIS — K7460 Unspecified cirrhosis of liver: Secondary | ICD-10-CM | POA: Diagnosis not present

## 2020-08-13 DIAGNOSIS — R296 Repeated falls: Secondary | ICD-10-CM | POA: Diagnosis present

## 2020-08-13 DIAGNOSIS — K7581 Nonalcoholic steatohepatitis (NASH): Secondary | ICD-10-CM | POA: Diagnosis present

## 2020-08-13 DIAGNOSIS — S92242D Displaced fracture of medial cuneiform of left foot, subsequent encounter for fracture with routine healing: Secondary | ICD-10-CM | POA: Diagnosis not present

## 2020-08-13 DIAGNOSIS — K7031 Alcoholic cirrhosis of liver with ascites: Secondary | ICD-10-CM | POA: Diagnosis not present

## 2020-08-13 DIAGNOSIS — S92242A Displaced fracture of medial cuneiform of left foot, initial encounter for closed fracture: Secondary | ICD-10-CM | POA: Diagnosis present

## 2020-08-13 DIAGNOSIS — Z79890 Hormone replacement therapy (postmenopausal): Secondary | ICD-10-CM

## 2020-08-13 DIAGNOSIS — R109 Unspecified abdominal pain: Secondary | ICD-10-CM | POA: Diagnosis not present

## 2020-08-13 DIAGNOSIS — M79672 Pain in left foot: Secondary | ICD-10-CM | POA: Diagnosis not present

## 2020-08-13 DIAGNOSIS — E440 Moderate protein-calorie malnutrition: Secondary | ICD-10-CM | POA: Diagnosis not present

## 2020-08-13 DIAGNOSIS — Z79899 Other long term (current) drug therapy: Secondary | ICD-10-CM | POA: Diagnosis not present

## 2020-08-13 DIAGNOSIS — Z9114 Patient's other noncompliance with medication regimen: Secondary | ICD-10-CM

## 2020-08-13 DIAGNOSIS — R9431 Abnormal electrocardiogram [ECG] [EKG]: Secondary | ICD-10-CM | POA: Diagnosis not present

## 2020-08-13 DIAGNOSIS — R634 Abnormal weight loss: Secondary | ICD-10-CM

## 2020-08-13 DIAGNOSIS — E039 Hypothyroidism, unspecified: Secondary | ICD-10-CM | POA: Diagnosis present

## 2020-08-13 DIAGNOSIS — R140 Abdominal distension (gaseous): Secondary | ICD-10-CM

## 2020-08-13 DIAGNOSIS — I10 Essential (primary) hypertension: Secondary | ICD-10-CM | POA: Diagnosis not present

## 2020-08-13 DIAGNOSIS — K766 Portal hypertension: Secondary | ICD-10-CM | POA: Diagnosis present

## 2020-08-13 DIAGNOSIS — I8500 Esophageal varices without bleeding: Secondary | ICD-10-CM | POA: Diagnosis present

## 2020-08-13 DIAGNOSIS — Z23 Encounter for immunization: Secondary | ICD-10-CM

## 2020-08-13 DIAGNOSIS — W19XXXA Unspecified fall, initial encounter: Secondary | ICD-10-CM | POA: Diagnosis not present

## 2020-08-13 DIAGNOSIS — D6959 Other secondary thrombocytopenia: Secondary | ICD-10-CM | POA: Diagnosis present

## 2020-08-13 DIAGNOSIS — J189 Pneumonia, unspecified organism: Secondary | ICD-10-CM | POA: Diagnosis not present

## 2020-08-13 DIAGNOSIS — Y92009 Unspecified place in unspecified non-institutional (private) residence as the place of occurrence of the external cause: Secondary | ICD-10-CM

## 2020-08-13 DIAGNOSIS — Z880 Allergy status to penicillin: Secondary | ICD-10-CM

## 2020-08-13 DIAGNOSIS — D696 Thrombocytopenia, unspecified: Secondary | ICD-10-CM | POA: Diagnosis not present

## 2020-08-13 DIAGNOSIS — R531 Weakness: Secondary | ICD-10-CM | POA: Diagnosis not present

## 2020-08-13 DIAGNOSIS — R627 Adult failure to thrive: Secondary | ICD-10-CM | POA: Diagnosis present

## 2020-08-13 DIAGNOSIS — Z681 Body mass index (BMI) 19 or less, adult: Secondary | ICD-10-CM | POA: Diagnosis not present

## 2020-08-13 DIAGNOSIS — J90 Pleural effusion, not elsewhere classified: Secondary | ICD-10-CM | POA: Diagnosis not present

## 2020-08-13 DIAGNOSIS — K219 Gastro-esophageal reflux disease without esophagitis: Secondary | ICD-10-CM | POA: Diagnosis present

## 2020-08-13 DIAGNOSIS — R1084 Generalized abdominal pain: Secondary | ICD-10-CM | POA: Diagnosis not present

## 2020-08-13 DIAGNOSIS — S9032XA Contusion of left foot, initial encounter: Secondary | ICD-10-CM | POA: Diagnosis not present

## 2020-08-13 DIAGNOSIS — M6281 Muscle weakness (generalized): Secondary | ICD-10-CM | POA: Diagnosis not present

## 2020-08-13 DIAGNOSIS — E871 Hypo-osmolality and hyponatremia: Secondary | ICD-10-CM | POA: Diagnosis not present

## 2020-08-13 DIAGNOSIS — M545 Low back pain, unspecified: Secondary | ICD-10-CM | POA: Diagnosis not present

## 2020-08-13 DIAGNOSIS — K7200 Acute and subacute hepatic failure without coma: Principal | ICD-10-CM | POA: Diagnosis present

## 2020-08-13 DIAGNOSIS — D731 Hypersplenism: Secondary | ICD-10-CM | POA: Diagnosis present

## 2020-08-13 DIAGNOSIS — J181 Lobar pneumonia, unspecified organism: Secondary | ICD-10-CM | POA: Diagnosis not present

## 2020-08-13 DIAGNOSIS — D509 Iron deficiency anemia, unspecified: Secondary | ICD-10-CM | POA: Diagnosis present

## 2020-08-13 DIAGNOSIS — M1990 Unspecified osteoarthritis, unspecified site: Secondary | ICD-10-CM | POA: Diagnosis present

## 2020-08-13 DIAGNOSIS — M810 Age-related osteoporosis without current pathological fracture: Secondary | ICD-10-CM | POA: Diagnosis present

## 2020-08-13 DIAGNOSIS — N390 Urinary tract infection, site not specified: Secondary | ICD-10-CM | POA: Diagnosis not present

## 2020-08-13 DIAGNOSIS — K7682 Hepatic encephalopathy: Secondary | ICD-10-CM | POA: Diagnosis present

## 2020-08-13 DIAGNOSIS — K429 Umbilical hernia without obstruction or gangrene: Secondary | ICD-10-CM | POA: Diagnosis present

## 2020-08-13 DIAGNOSIS — G8929 Other chronic pain: Secondary | ICD-10-CM | POA: Diagnosis present

## 2020-08-13 DIAGNOSIS — K7290 Hepatic failure, unspecified without coma: Secondary | ICD-10-CM | POA: Diagnosis not present

## 2020-08-13 DIAGNOSIS — R54 Age-related physical debility: Secondary | ICD-10-CM | POA: Diagnosis present

## 2020-08-13 DIAGNOSIS — Z885 Allergy status to narcotic agent status: Secondary | ICD-10-CM

## 2020-08-13 DIAGNOSIS — N3001 Acute cystitis with hematuria: Secondary | ICD-10-CM

## 2020-08-13 DIAGNOSIS — S0990XA Unspecified injury of head, initial encounter: Secondary | ICD-10-CM | POA: Diagnosis not present

## 2020-08-13 DIAGNOSIS — M6389 Disorders of muscle in diseases classified elsewhere, multiple sites: Secondary | ICD-10-CM | POA: Diagnosis not present

## 2020-08-13 DIAGNOSIS — R0902 Hypoxemia: Secondary | ICD-10-CM | POA: Diagnosis not present

## 2020-08-13 DIAGNOSIS — R110 Nausea: Secondary | ICD-10-CM | POA: Diagnosis not present

## 2020-08-13 DIAGNOSIS — K769 Liver disease, unspecified: Secondary | ICD-10-CM | POA: Diagnosis not present

## 2020-08-13 LAB — COMPREHENSIVE METABOLIC PANEL
ALT: 29 U/L (ref 0–44)
AST: 48 U/L — ABNORMAL HIGH (ref 15–41)
Albumin: 2.7 g/dL — ABNORMAL LOW (ref 3.5–5.0)
Alkaline Phosphatase: 156 U/L — ABNORMAL HIGH (ref 38–126)
Anion gap: 9 (ref 5–15)
BUN: 18 mg/dL (ref 8–23)
CO2: 20 mmol/L — ABNORMAL LOW (ref 22–32)
Calcium: 8.8 mg/dL — ABNORMAL LOW (ref 8.9–10.3)
Chloride: 103 mmol/L (ref 98–111)
Creatinine, Ser: 0.6 mg/dL (ref 0.44–1.00)
GFR, Estimated: >60 mL/min (ref >60)
Glucose, Bld: 150 mg/dL — ABNORMAL HIGH (ref 70–99)
Potassium: 4.3 mmol/L (ref 3.5–5.1)
Sodium: 132 mmol/L — ABNORMAL LOW (ref 135–145)
Total Bilirubin: 2.9 mg/dL — ABNORMAL HIGH (ref 0.3–1.2)
Total Protein: 6.1 g/dL — ABNORMAL LOW (ref 6.5–8.1)

## 2020-08-13 LAB — URINALYSIS, ROUTINE W REFLEX MICROSCOPIC
Bilirubin Urine: NEGATIVE
Glucose, UA: NEGATIVE mg/dL
Ketones, ur: NEGATIVE mg/dL
Nitrite: POSITIVE — AB
Protein, ur: 30 mg/dL — AB
RBC / HPF: >50 RBC/hpf — ABNORMAL HIGH (ref 0–5)
Specific Gravity, Urine: 1.024 (ref 1.005–1.030)
pH: 6 (ref 5.0–8.0)

## 2020-08-13 LAB — CBC WITH DIFFERENTIAL/PLATELET
Abs Immature Granulocytes: 0.13 10*3/uL — ABNORMAL HIGH (ref 0.00–0.07)
Basophils Absolute: 0 10*3/uL (ref 0.0–0.1)
Basophils Relative: 1 %
Eosinophils Absolute: 0.2 10*3/uL (ref 0.0–0.5)
Eosinophils Relative: 3 %
HCT: 29.8 % — ABNORMAL LOW (ref 36.0–46.0)
Hemoglobin: 9.2 g/dL — ABNORMAL LOW (ref 12.0–15.0)
Immature Granulocytes: 2 %
Lymphocytes Relative: 8 %
Lymphs Abs: 0.7 10*3/uL (ref 0.7–4.0)
MCH: 26.3 pg (ref 26.0–34.0)
MCHC: 30.9 g/dL (ref 30.0–36.0)
MCV: 85.1 fL (ref 80.0–100.0)
Monocytes Absolute: 0.9 10*3/uL (ref 0.1–1.0)
Monocytes Relative: 11 %
Neutro Abs: 6.1 10*3/uL (ref 1.7–7.7)
Neutrophils Relative %: 75 %
Platelets: 45 10*3/uL — ABNORMAL LOW (ref 150–400)
RBC: 3.5 MIL/uL — ABNORMAL LOW (ref 3.87–5.11)
RDW: 19.4 % — ABNORMAL HIGH (ref 11.5–15.5)
WBC: 8.1 10*3/uL (ref 4.0–10.5)
nRBC: 0 % (ref 0.0–0.2)

## 2020-08-13 LAB — CULTURE, BODY FLUID W GRAM STAIN -BOTTLE: Culture: NO GROWTH

## 2020-08-13 LAB — PROTIME-INR
INR: 1.3 — ABNORMAL HIGH (ref 0.8–1.2)
Prothrombin Time: 15.7 seconds — ABNORMAL HIGH (ref 11.4–15.2)

## 2020-08-13 LAB — RESP PANEL BY RT PCR (RSV, FLU A&B, COVID)
Influenza A by PCR: NEGATIVE
Influenza B by PCR: NEGATIVE
Respiratory Syncytial Virus by PCR: NEGATIVE
SARS Coronavirus 2 by RT PCR: NEGATIVE

## 2020-08-13 LAB — AMMONIA: Ammonia: 131 umol/L — ABNORMAL HIGH (ref 9–35)

## 2020-08-13 MED ORDER — SODIUM CHLORIDE 0.9 % IV SOLN
1.0000 g | Freq: Once | INTRAVENOUS | Status: AC
  Administered 2020-08-13: 1 g via INTRAVENOUS
  Filled 2020-08-13: qty 10

## 2020-08-13 MED ORDER — LACTULOSE 10 GM/15ML PO SOLN
20.0000 g | Freq: Once | ORAL | Status: AC
  Administered 2020-08-14: 20 g via ORAL
  Filled 2020-08-13 (×2): qty 30

## 2020-08-13 NOTE — ED Provider Notes (Signed)
[facility] EMERGENCY DEPARTMENT Provider Note   CSN: [id number] Arrival date & time: 08/13/20  1731     History Chief Complaint  Patient presents with  . Weakness    Megan Roberson is a 64 y.o. female.  HPI   64 year old female with a history of anxiety, liver cirrhosis, depression, heart murmur, who presents to the emergency department today for evaluation of multiple complaints.  Patient is here with her husband who also provides some of the history.  Patient's main complaint is generalized weakness.  States over the last month she has been becoming increasingly weak.  She is had multiple falls and has sustained head trauma multiple times.  She also has some pain in her left foot.  As of the last few days she has been able to stand or walk due to her significant weakness.  She is also had nausea, abdominal pain, dark urine and hematuria as well.  Due to her nausea she has not been taking her lactulose.  She is not having any vomiting, fevers.  Her husband states she has been a little bit more confused than normal.  Additionally, patient states she had a paracentesis just a few days ago and felt like immediately after her visit her abdomen became distended, tense and painful.  She is also concerned because she did not receive albumin at this visit and wonders if this is contributing to her weakness.  Past Medical History:  Diagnosis Date  . Anxiety    pt. denies  . Arthritis   . Cirrhosis of liver ([place])   . Complication of anesthesia    ileus after surgery 2018  . Depression    "not all the time"  . Heart murmur    Echo 10/06/17 [facility]): Normal LV/RV size, basal septal hypertophy-normal variant, LVEF 65-70%, normal LV/RV function, est RAP 5 mmHg, no sign valvular stenosis or regurg--trace MR/TR/PR  . History of abdominal paracentesis   . History of blood transfusion   . Hypothyroidism   . Ileus ([place])   . Liver disease 2008  . Neuropathy   . Osteoporosis 02/09/2018  .  Pancytopenia ([place])   . Pneumonia    2019ish  . Thrombocytopenia ([place]) 12/01/2019  . Vaginal Pap smear, abnormal     Patient Active Problem List   Diagnosis Date Noted  . Generalized weakness 08/13/2020  . Pain in right knee 06/04/2020  . Pain in left hip 06/04/2020  . Nausea & vomiting 05/19/2020  . Prolonged QT interval 05/19/2020  . Elevated brain natriuretic peptide (BNP) level 05/19/2020  . Hypoalbuminemia 05/19/2020  . Cough 05/19/2020  . Atelectasis 05/19/2020  . Hypomagnesemia 05/19/2020  . Cysts of both ovaries 06/15/2019  . Closed fracture of left tibial plateau 05/17/18 07/08/2018  . Esophageal varices without bleeding ([place])   . Osteoporosis 02/09/2018  . Colon cancer screening 01/18/2018  . Ascites 11/25/2017  . Cellulitis, abdominal wall 11/25/2017  . SOB (shortness of breath) 11/25/2017  . Protein-calorie malnutrition, severe 11/06/2017  . Ogilvie's syndrome   . Abdominal distension   . Abdominal pain   . Liver failure without hepatic coma ([place])   . Thrombocytopenia ([place])   . Closed fracture of lumbar spine without lesion of spinal cord ([place])   . Cirrhosis of liver ([place]) 04/21/2017  . Anemia 04/21/2017  . History of open reduction and internal fixation (ORIF) procedure 04/21/2017  . Visit for suture removal 04/21/2017  . Left Olecranon fracture 04/21/2017  . Hypokalemia 04/21/2017    Past Surgical History:  Procedure Laterality Date  . AUGMENTATION MAMMAPLASTY Bilateral    20 years ago  . BIOPSY  03/15/2018   Procedure: BIOPSY;  Surgeon: Yetta Flock, MD;  Location: [facility];  Service: Gastroenterology;;  Gastric  . COLONOSCOPY WITH ESOPHAGOGASTRODUODENOSCOPY (EGD) AND ESOPHAGEAL DILATION (ED)    . COLONOSCOPY WITH PROPOFOL N/A 03/15/2018   Procedure: COLONOSCOPY WITH PROPOFOL;  Surgeon: Yetta Flock, MD;  Location: [facility];  Service: Gastroenterology;  Laterality: N/A;  . ESOPHAGOGASTRODUODENOSCOPY (EGD) WITH PROPOFOL N/A  03/15/2018   Procedure: ESOPHAGOGASTRODUODENOSCOPY (EGD) WITH PROPOFOL;  Surgeon: Yetta Flock, MD;  Location: [facility];  Service: Gastroenterology;  Laterality: N/A;  . ESOPHAGOGASTRODUODENOSCOPY W/ BANDING     Varices  . FRACTURE SURGERY Left    x 2 L elbow and hip left   . HARDWARE REMOVAL Left 09/17/2017   Procedure: HARDWARE REMOVAL LEFT OLECRANON;  Surgeon: Altamese Rockvale, MD;  Location: [facility];  Service: Orthopedics;  Laterality: Left;  . HIP SURGERY Left    from fracture- rod in palce  . IR PARACENTESIS  11/18/2017  . IR PARACENTESIS  11/26/2017  . IR PARACENTESIS  03/14/2018  . IR PARACENTESIS  08/08/2020  . KNEE SURGERY Right    open incision for ligaments  . ORIF ELBOW FRACTURE Left 04/30/2017   Procedure: REVISION OPEN REDUCTION INTERNAL FIXATION (ORIF) ELBOW/OLECRANON FRACTURE;  Surgeon: Altamese Acampo, MD;  Location: [place];  Service: Orthopedics;  Laterality: Left;  . REPAIR EXTENSOR TENDON Left 12/29/2019   Procedure: LEFT LONG AND LEFT RING FRINGER EXTENSOR REALIGNAMENT WITH POSSIBLE TENDON TRANSFER;  Surgeon: Charlotte Crumb, MD;  Location: [place];  Service: Orthopedics;  Laterality: Left;     OB History    Gravida  2   Para  2   Term  2   Preterm      AB      Living  1     SAB      TAB      Ectopic      Multiple      Live Births  2           Family History  Adopted: Yes  Problem Relation Age of Onset  . Alcohol abuse Daughter     Social History   Tobacco Use  . Smoking status: Never Smoker  . Smokeless tobacco: Never Used  Vaping Use  . Vaping Use: Never used  Substance Use Topics  . Alcohol use: No  . Drug use: No    Home Medications Prior to Admission medications   Medication Sig Start Date End Date Taking? Authorizing Provider  denosumab (PROLIA) 60 MG/ML SOSY injection Inject 60 mg into the skin every 6 (six) months.   Yes [provider]  diclofenac Sodium (VOLTAREN) 1 % GEL Apply 2 g topically daily as  needed (for pain).  04/23/20  Yes [provider]  furosemide (LASIX) 80 MG tablet Take 80 mg by mouth daily.   Yes [provider]  hydrOXYzine (ATARAX/VISTARIL) 50 MG tablet Take 50 mg by mouth at bedtime. 06/17/20  Yes [provider]  levOCARNitine (CARNITOR) 330 MG tablet Take 330 mg by mouth 3 (three) times daily.   Yes [provider]  levothyroxine (SYNTHROID, LEVOTHROID) 75 MCG tablet Take 1 tablet (75 mcg total) by mouth daily before breakfast. 12/02/17  Yes Aline August, MD  omeprazole (PRILOSEC) 20 MG capsule Take 1 capsule (20 mg total) by mouth daily. 08/06/20  Yes Willia Craze, NP  ondansetron [facility]  ODT) 4 MG disintegrating tablet Take 1 tablet (4 mg total) by mouth every 8 (eight) hours as needed for nausea or vomiting. 08/06/20  Yes Willia Craze, NP  oxyCODONE (OXY IR/ROXICODONE) 5 MG immediate release tablet Take 1 tablet (5 mg total) by mouth every 12 (twelve) hours as needed for severe pain. 05/21/20  Yes Roxan Hockey, MD  Potassium Chloride ER 20 MEQ TBCR Take 20 mEq by mouth every Tuesday, Thursday, Saturday, and Sunday. 05/21/20  Yes Emokpae, Courage, MD  promethazine (PHENERGAN) 12.5 MG tablet Take 12.5 mg by mouth daily as needed. 08/13/20  Yes [provider]  rifaximin (XIFAXAN) 550 MG TABS tablet Take 1 tablet (550 mg total) by mouth 2 (two) times daily. 05/21/20  Yes Emokpae, Courage, MD  sertraline (ZOLOFT) 50 MG tablet Take 50 mg by mouth daily. 05/20/20  Yes [provider]  spironolactone (ALDACTONE) 100 MG tablet Take 1.5 tablets (150 mg total) by mouth daily. 05/21/20  Yes Roxan Hockey, MD  traZODone (DESYREL) 50 MG tablet Take 1 tablet (50 mg total) by mouth at bedtime as needed for sleep. 08/06/20  Yes Willia Craze, NP    Allergies    Penicillins, Tylenol [acetaminophen], Sulfa antibiotics, and Tramadol  Review of Systems   Review of Systems  Constitutional: Negative for chills and fever.   HENT: Negative for ear pain and sore throat.   Eyes: Negative for visual disturbance.  Respiratory: Negative for cough and shortness of breath.   Cardiovascular: Negative for chest pain.  Gastrointestinal: Positive for abdominal pain and nausea. Negative for diarrhea and vomiting.  Genitourinary: Positive for hematuria. Negative for dysuria.  Musculoskeletal: Negative for back pain.       Left foot pain  Skin: Negative for rash.  Neurological: Positive for weakness and headaches.       Head injury  All other systems reviewed and are negative.   Physical Exam Updated Vital Signs BP 129/82 (BP Location: Right Arm)   Pulse 97   Temp 98.6 F (37 C) (Rectal)   Resp 16   Ht 5' 7"  (1.702 m)   Wt 54.4 kg   SpO2 95%   BMI 18.79 kg/m   Physical Exam Vitals and nursing note reviewed.  Constitutional:      General: She is not in acute distress.    Appearance: She is well-developed.  HENT:     Head: Normocephalic and atraumatic.  Eyes:     Conjunctiva/sclera: Conjunctivae normal.  Cardiovascular:     Rate and Rhythm: Normal rate and regular rhythm.     Heart sounds: No murmur heard.   Pulmonary:     Effort: Pulmonary effort is normal. No respiratory distress.     Breath sounds: Normal breath sounds.  Abdominal:     Palpations: Abdomen is soft.     Tenderness: There is no abdominal tenderness.  Musculoskeletal:     Cervical back: Neck supple.  Skin:    General: Skin is warm and dry.  Neurological:     Mental Status: She is alert.     ED Results / Procedures / Treatments   Labs (all labs ordered are listed, but only abnormal results are displayed) Labs Reviewed  COMPREHENSIVE METABOLIC PANEL - Abnormal; Notable for the following components:      Result Value   Sodium 132 (*)    CO2 20 (*)    Glucose, Bld 150 (*)    Calcium 8.8 (*)    Total Protein 6.1 (*)  Albumin 2.7 (*)    AST 48 (*)    Alkaline Phosphatase 156 (*)    Total Bilirubin 2.9 (*)    All other  components within normal limits  CBC WITH DIFFERENTIAL/PLATELET - Abnormal; Notable for the following components:   RBC 3.50 (*)    Hemoglobin 9.2 (*)    HCT 29.8 (*)    RDW 19.4 (*)    Platelets 45 (*)    Abs Immature Granulocytes 0.13 (*)    All other components within normal limits  URINALYSIS, ROUTINE W REFLEX MICROSCOPIC - Abnormal; Notable for the following components:   Color, Urine AMBER (*)    APPearance HAZY (*)    Hgb urine dipstick LARGE (*)    Protein, ur 30 (*)    Nitrite POSITIVE (*)    Leukocytes,Ua TRACE (*)    RBC / HPF >50 (*)    Bacteria, UA RARE (*)    All other components within normal limits  PROTIME-INR - Abnormal; Notable for the following components:   Prothrombin Time 15.7 (*)    INR 1.3 (*)    All other components within normal limits  AMMONIA - Abnormal; Notable for the following components:   Ammonia 131 (*)    All other components within normal limits  RESP PANEL BY RT PCR (RSV, FLU A&B, COVID)  URINE CULTURE    EKG EKG Interpretation  Date/Time:  Tuesday August 13 2020 22:54:04 EDT Ventricular Rate:  77 PR Interval:    QRS Duration: 146 QT Interval:  434 QTC Calculation: 492 R Axis:   15 Text Interpretation: Sinus rhythm Nonspecific intraventricular conduction delay since last tracing no significant change Confirmed by Daleen Bo [id number]) on 08/13/2020 11:20:10 PM   Radiology CT Head Wo Contrast  Result Date: 08/13/2020 CLINICAL DATA:  Fall, head injury EXAM: CT HEAD WITHOUT CONTRAST TECHNIQUE: Contiguous axial images were obtained from the base of the skull through the vertex without intravenous contrast. COMPARISON:  None. FINDINGS: Brain: Normal anatomic configuration. Parenchymal volume loss is commensurate with the patient's age. No abnormal intra or extra-axial mass lesion or fluid collection. No abnormal mass effect or midline shift. No evidence of acute intracranial hemorrhage or infarct. Ventricular size is normal. Cerebellum  unremarkable. Vascular: No asymmetric hyperdense vasculature at the skull base. Skull: Intact Sinuses/Orbits: Paranasal sinuses are clear. Orbits are unremarkable. Other: Mastoid air cells and middle ear cavities are clear. IMPRESSION: No acute intracranial injury.  No calvarial fracture. Electronically Signed   By: Fidela Salisbury MD   On: 08/13/2020 22:06   DG Foot Complete Left  Result Date: 08/13/2020 CLINICAL DATA:  Fall, left foot bruising EXAM: LEFT FOOT - COMPLETE 3+ VIEW COMPARISON:  None. 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 There is an acute intra-articular fracture of the probable medial navicular noted dorsally with fracture fragments in near anatomic alignment. Soft tissues are unremarkable. Joint spaces appear preserved. IMPRESSION: Tiny minimally displaced fracture of the probable medial cuneiform dorsally with fracture fragments in anatomic alignment. Electronically Signed   By: Fidela Salisbury MD   On: 08/13/2020 22:31    Procedures Procedures (including critical care time)  Medications Ordered in ED Medications  cefTRIAXone (ROCEPHIN) 1 g in sodium chloride 0.9 % 100 mL IVPB (has no administration in time range)  lactulose (CHRONULAC) 10 GM/15ML solution 20 g (has no administration in time range)    ED Course  I have reviewed the  triage vital signs and the nursing notes.  Pertinent labs & imaging results that were available during my care of the patient were reviewed by me and considered in my medical decision making (see chart for details).    MDM Rules/Calculators/A&P                          64 year old female with a history of cirrhosis presenting for evaluation of generalized weakness, increased falls, abdominal pain and hematuria.  Reviewed/interpreted CBC is without leukocytosis but does show anemia CMP with mild hyponatremia, elevated blood glucose of 150, creatinine  normal, AST slightly elevated and bilirubin elevated at 2.9 which is actually improved from prior.  Alk phos is elevated INR elevated at 1.3 which is improved from Letter ammonia elevated of 130 UA consistent with UTI with leukocytes, nitrites and RBCs.  Urine culture was sent and patient was given ceftriaxone. Covid test was ordered and pending at the time of admission  CT head negative for acute traumatic injury  X-ray of the left foot shows minimally displaced fracture of the medial cuneiform.  Patient placed in posterior ankle splint.  Patient with UTI, increased weakness, noncompliance with lactulose, elevated ammonia concerning for developing hepatic encephalopathy.  Will admit for further treatment.   11:20 PM CONSULT with Dr. Josephine Cables who accepts patient for admission.   Final Clinical Impression(s) / ED Diagnoses Final diagnoses:  Acute cystitis with hematuria  Weakness    Rx / DC Orders ED Discharge Orders    None       Bishop Dublin 08/13/20 2339    Daleen Bo, MD 08/14/20 [phone]

## 2020-08-13 NOTE — H&P (Signed)
History and Physical  Megan Roberson ZOX:[id number] DOB: 12/08/1955 DOA: 08/13/2020  Referring physician: Rodney Booze, PA-C PCP: Celene Squibb, MD  Patient coming from: Home  Chief Complaint: Generalized weakness  HPI: Megan Roberson is a 64 y.o. female with medical history significant for  pancytopenia, cirrhosis of the liver, anemia, hypothyroidism, GERD and insomnia and multiple fractures whopresents to the Emergency Department  due to generalized weakness, nausea and left foot pain.  She complained of decreased appetite since last 2 months with loss of 20 pounds and about 1 month of increasing and worsening generalized weakness which has progressed to inability to walk with a walker despite assistance by her husband within last several days.  She endorsed history of multiple falls with last fall being last week and worsening left foot pain since the fall.  Patient states that she has not been compliant with her lactulose due to nausea, she denies vomiting, fevers, headache, chest pain, shortness of breath.  She had a recent paracentesis on 10/7 during which 3.3 L of clear, light yellow fluid was removed.  Patient states that she only takes Aldactone and that she was asked by her GI to stop taking Lasix due to low potassium when on Lasix.  ED Course: In the emergency department, she was hemodynamically stable.  Work-up in the ED showed normocytic anemia, thrombocytopenia, hyponatremia, hyperglycemia, hypoalbuminemia, hyperammonemia (131), ALP 156, total bili 2.9.  Urinalysis was positive for UTI.  Left foot x-ray showed tiny minimally displaced fracture of the probable medial cuneiform dorsally with fracture fragments in anatomic alignment.  CT head without contrast showed no acute retinal injury and no calvarial fracture.  IV ceftriaxone was given due to presumed UTI, lactulose was given.  Hospitalist was asked to admit patient for further evaluation management.  Review of  Systems: Constitutional: Negative for chills and fever.  HENT: Negative for ear pain and sore throat.   Eyes: Negative for pain and visual disturbance.  Respiratory: Negative for cough, chest tightness and shortness of breath.   Cardiovascular: Negative for chest pain and palpitations.  Gastrointestinal: Positive for nausea and abdominal pain.  Endocrine: Negative for polyphagia and polyuria.  Genitourinary: Positive for blood in urine.  Negative for decreased urine volume, dysuria, enuresis Musculoskeletal: Negative for arthralgias and back pain.  Skin: Negative for color change and rash.  Allergic/Immunologic: Negative for immunocompromised state.  Neurological: Positive for weakness and headache.  Negative for tremors, syncope, speech difficulty Hematological: Does not bruise/bleed easily.  All other systems reviewed and are negative    Past Medical History:  Diagnosis Date  . Anxiety    pt. denies  . Arthritis   . Cirrhosis of liver ([place])   . Complication of anesthesia    ileus after surgery 2018  . Depression    "not all the time"  . Heart murmur    Echo 10/06/17 [facility]): Normal LV/RV size, basal septal hypertophy-normal variant, LVEF 65-70%, normal LV/RV function, est RAP 5 mmHg, no sign valvular stenosis or regurg--trace MR/TR/PR  . History of abdominal paracentesis   . History of blood transfusion   . Hypothyroidism   . Ileus ([place])   . Liver disease 2008  . Neuropathy   . Osteoporosis 02/09/2018  . Pancytopenia ([place])   . Pneumonia    2019ish  . Thrombocytopenia ([place]) 12/01/2019  . Vaginal Pap smear, abnormal    Past Surgical History:  Procedure Laterality Date  . AUGMENTATION MAMMAPLASTY Bilateral    20 years ago  . BIOPSY  03/15/2018   Procedure: BIOPSY;  Surgeon: Yetta Flock, MD;  Location: [facility];  Service: Gastroenterology;;  Gastric  . COLONOSCOPY WITH ESOPHAGOGASTRODUODENOSCOPY (EGD) AND ESOPHAGEAL DILATION (ED)    . COLONOSCOPY WITH PROPOFOL  N/A 03/15/2018   Procedure: COLONOSCOPY WITH PROPOFOL;  Surgeon: Yetta Flock, MD;  Location: [facility];  Service: Gastroenterology;  Laterality: N/A;  . ESOPHAGOGASTRODUODENOSCOPY (EGD) WITH PROPOFOL N/A 03/15/2018   Procedure: ESOPHAGOGASTRODUODENOSCOPY (EGD) WITH PROPOFOL;  Surgeon: Yetta Flock, MD;  Location: [facility];  Service: Gastroenterology;  Laterality: N/A;  . ESOPHAGOGASTRODUODENOSCOPY W/ BANDING     Varices  . FRACTURE SURGERY Left    x 2 L elbow and hip left   . HARDWARE REMOVAL Left 09/17/2017   Procedure: HARDWARE REMOVAL LEFT OLECRANON;  Surgeon: Altamese Lehr, MD;  Location: [place];  Service: Orthopedics;  Laterality: Left;  . HIP SURGERY Left    from fracture- rod in palce  . IR PARACENTESIS  11/18/2017  . IR PARACENTESIS  11/26/2017  . IR PARACENTESIS  03/14/2018  . IR PARACENTESIS  08/08/2020  . KNEE SURGERY Right    open incision for ligaments  . ORIF ELBOW FRACTURE Left 04/30/2017   Procedure: REVISION OPEN REDUCTION INTERNAL FIXATION (ORIF) ELBOW/OLECRANON FRACTURE;  Surgeon: Altamese [place], MD;  Location: [place];  Service: Orthopedics;  Laterality: Left;  . REPAIR EXTENSOR TENDON Left 12/29/2019   Procedure: LEFT LONG AND LEFT RING FRINGER EXTENSOR REALIGNAMENT WITH POSSIBLE TENDON TRANSFER;  Surgeon: Charlotte Crumb, MD;  Location: [place];  Service: Orthopedics;  Laterality: Left;    Social History:  reports that she has never smoked. She has never used smokeless tobacco. She reports that she does not drink alcohol and does not use drugs.   Allergies  Allergen Reactions  . Penicillins Swelling and Other (See Comments)    FACIAL SWELLING Did it involve swelling of the face/tongue/throat, SOB, or low BP? Yes Did it involve sudden or severe rash/hives, skin peeling, or any reaction on the inside of your mouth or nose? No Did you need to seek medical attention at a hospital or doctor's office? Unknown When did it last happen?30 years If  all above answers are "NO", may proceed with cephalosporin use.   . Tylenol [Acetaminophen] Nausea Only  . Sulfa Antibiotics Itching and Rash  . Tramadol Swelling and Rash    Family History  Adopted: Yes  Problem Relation Age of Onset  . Alcohol abuse Daughter     Prior to Admission medications   Medication Sig Start Date End Date Taking? Authorizing Provider  denosumab (PROLIA) 60 MG/ML SOSY injection Inject 60 mg into the skin every 6 (six) months.   Yes [provider]  diclofenac Sodium (VOLTAREN) 1 % GEL Apply 2 g topically daily as needed (for pain).  04/23/20  Yes [provider]  furosemide (LASIX) 80 MG tablet Take 80 mg by mouth daily.   Yes [provider]  hydrOXYzine (ATARAX/VISTARIL) 50 MG tablet Take 50 mg by mouth at bedtime. 06/17/20  Yes [provider]  levOCARNitine (CARNITOR) 330 MG tablet Take 330 mg by mouth 3 (three) times daily.   Yes [provider]  levothyroxine (SYNTHROID, LEVOTHROID) 75 MCG tablet Take 1 tablet (75 mcg total) by mouth daily before breakfast. 12/02/17  Yes Aline August, MD  omeprazole (PRILOSEC) 20 MG capsule Take 1 capsule (20 mg total) by mouth daily. 08/06/20  Yes Willia Craze, NP  ondansetron (ZOFRAN ODT) 4 MG disintegrating tablet Take 1 tablet (  4 mg total) by mouth every 8 (eight) hours as needed for nausea or vomiting. 08/06/20  Yes Willia Craze, NP  oxyCODONE (OXY IR/ROXICODONE) 5 MG immediate release tablet Take 1 tablet (5 mg total) by mouth every 12 (twelve) hours as needed for severe pain. 05/21/20  Yes Roxan Hockey, MD  Potassium Chloride ER 20 MEQ TBCR Take 20 mEq by mouth every Tuesday, Thursday, Saturday, and Sunday. 05/21/20  Yes Emokpae, Courage, MD  promethazine (PHENERGAN) 12.5 MG tablet Take 12.5 mg by mouth daily as needed. 08/13/20  Yes [provider]  rifaximin (XIFAXAN) 550 MG TABS tablet Take 1 tablet (550 mg total) by mouth 2 (two) times daily. 05/21/20   Yes Emokpae, Courage, MD  sertraline (ZOLOFT) 50 MG tablet Take 50 mg by mouth daily. 05/20/20  Yes [provider]  spironolactone (ALDACTONE) 100 MG tablet Take 1.5 tablets (150 mg total) by mouth daily. 05/21/20  Yes Roxan Hockey, MD  traZODone (DESYREL) 50 MG tablet Take 1 tablet (50 mg total) by mouth at bedtime as needed for sleep. 08/06/20  Yes Willia Craze, NP    Physical Exam: BP 126/72   Pulse 82   Temp 98.6 F (37 C) (Rectal)   Resp 15   Ht 5' 7"  (1.702 m)   Wt 54.4 kg   SpO2 98%   BMI 18.79 kg/m   . General: 64 y.o. year-old female well developed well nourished in no acute distress.  Alert and oriented x3. Marland Kitchen HEENT: NCAT, EOMI . Neck: Supple, trachea midline . Cardiovascular: Regular rate and rhythm with no rubs or gallops.  No thyromegaly or JVD noted.  2/4 pulses in all 4 extremities. Marland Kitchen Respiratory: Clear to auscultation with no wheezes or rales. Good inspiratory effort. . Abdomen: Soft, tender to mild palpation without guarding.  Mildly distended with normal bowel sounds x4 quadrants. . Muskuloskeletal: No cyanosis, clubbing or edema noted bilaterally . Neuro: CN II-XII intact, strength, sensation, reflexes . Skin: No ulcerative lesions noted or rashes . Psychiatry: Judgement and insight appear normal. Mood is appropriate for condition and setting          Labs on Admission:  Basic Metabolic Panel: Recent Labs  Lab 08/13/20 1815  NA 132*  K 4.3  CL 103  CO2 20*  GLUCOSE 150*  BUN 18  CREATININE 0.60  CALCIUM 8.8*   Liver Function Tests: Recent Labs  Lab 08/13/20 1815  AST 48*  ALT 29  ALKPHOS 156*  BILITOT 2.9*  PROT 6.1*  ALBUMIN 2.7*   No results for input(s): LIPASE, AMYLASE in the last 168 hours. Recent Labs  Lab 08/13/20 1815  AMMONIA 131*   CBC: Recent Labs  Lab 08/13/20 1815  WBC 8.1  NEUTROABS 6.1  HGB 9.2*  HCT 29.8*  MCV 85.1  PLT 45*   Cardiac Enzymes: No results for input(s): CKTOTAL, CKMB, CKMBINDEX,  TROPONINI in the last 168 hours.  BNP (last 3 results) Recent Labs    05/18/20 2209  BNP 114.0*    ProBNP (last 3 results) No results for input(s): PROBNP in the last 8760 hours.  CBG: No results for input(s): GLUCAP in the last 168 hours.  Radiological Exams on Admission: CT Head Wo Contrast  Result Date: 08/13/2020 CLINICAL DATA:  Fall, head injury EXAM: CT HEAD WITHOUT CONTRAST TECHNIQUE: Contiguous axial images were obtained from the base of the skull through the vertex without intravenous contrast. COMPARISON:  None. FINDINGS: Brain: Normal anatomic configuration. Parenchymal volume loss is commensurate with  the patient's age. No abnormal intra or extra-axial mass lesion or fluid collection. No abnormal mass effect or midline shift. No evidence of acute intracranial hemorrhage or infarct. Ventricular size is normal. Cerebellum unremarkable. Vascular: No asymmetric hyperdense vasculature at the skull base. Skull: Intact Sinuses/Orbits: Paranasal sinuses are clear. Orbits are unremarkable. Other: Mastoid air cells and middle ear cavities are clear. IMPRESSION: No acute intracranial injury.  No calvarial fracture. Electronically Signed   By: Fidela Salisbury MD   On: 08/13/2020 22:06   DG Foot Complete Left  Result Date: 08/13/2020 CLINICAL DATA:  Fall, left foot bruising EXAM: LEFT FOOT - COMPLETE 3+ VIEW COMPARISON:  None. 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 There is an acute intra-articular fracture of the probable medial navicular noted dorsally with fracture fragments in near anatomic alignment. Soft tissues are unremarkable. Joint spaces appear preserved. IMPRESSION: Tiny minimally displaced fracture of the probable medial cuneiform dorsally with fracture fragments in anatomic alignment. Electronically Signed   By: Fidela Salisbury MD   On: 08/13/2020 22:31    EKG: I independently viewed the  EKG done and my findings are as followed: Normal sinus rhythm at rate of 77 bpm with prolonged QTc (453m)  Assessment/Plan Present on Admission: . Cirrhosis of liver ([place] . Anemia . Abdominal distension . Abdominal pain . Thrombocytopenia ([place] . Protein-calorie malnutrition, severe . Prolonged QT interval . Hypoalbuminemia  Principal Problem:   Generalized weakness Active Problems:   Cirrhosis of liver ([facility])   Anemia   Abdominal distension   Abdominal pain   Thrombocytopenia ([facility])   Protein-calorie malnutrition, severe   Nausea   Prolonged QT interval   Hypoalbuminemia   Hypothyroidism   Displaced fracture of medial cuneiform of left foot, initial encounter for closed fracture   Failure to thrive in adult   Weight loss    Generalized weakness secondary to multifactorial  Abdominal pain and nausea resulting in decreased appetite and weight loss Patient lost 19 pounds (140 pounds on 06/04/2020; 121 pounds on 08/06/2020) within 2 months per GI medical record Continue IV Compazine 5 mg every 6 hours as needed Continue oxycodone 5 mg every 8 hours as needed for moderate/severe pain Consult dietitian for evaluation and recommendation  Left displaced medial cuneiform foot fracture secondary to mechanical fall Cast was placed in the ED with plan for patient to follow-up with orthopedic surgery as an outpatient Continue oxycodone as indicated above Continue fall precaution and neurochecks Continue PT eval and treat  Presumed UTI POA Urinalysis was positive for hematuria, nitrite Patient was started on IV ceftriaxone, we shall continue with same at this time with plan to de-escalate based on urine culture (pending)  Failure to thrive in adult Hypoalbuminemia secondary to protein calorie malnutrition Albumin 2.7 Protein supplement will be provided  Consult dietitian for evaluation and management  Thrombocytopenia in the setting of history of cirrhosis of  liver Hyperammonemia  Per medical record, patient does not meet transplant criteria by Dr. AHavery Morosand she was not a good candidate for TIPS due to history of encephalopathy Ammonia 131; Continue Aldactone, KCl per home regimen Recent paracentesis with aspiration of 3.3 L of clear, light yellow fluid on 10/7 with complaint of quick return of abdominal distention IR will be consulted to determine need for further paracentesis Continue lactulose and titrate to 3 loose bowel movements  Normocytic anemia-stable  Prolonged QTc  QTc 4935mAvoid QT prolonging drugs  Magnesium level will be checked  Hypothyroidism Continue Synthroid    DVT prophylaxis: SCDs  Code Status: Full code  Family Communication: None at bedside  Disposition Plan:  Patient is from:                        home Anticipated DC to:                   home Anticipated DC date:               2-3 days Anticipated DC barriers:          Patient unstable to be discharged at this time due to generalized weakness, failure to thrive and presumed UTI requiring inpatient management.   Consults called: IR, dietitian  Admission status: Inpatient    Bernadette Hoit MD [facility]  08/14/2020, 12:44 AM

## 2020-08-13 NOTE — ED Triage Notes (Signed)
Pt with liver disease and had a paracentesis the other day and did not receive Albumin this last time.  Pt with generalized weakness

## 2020-08-13 NOTE — ED Notes (Signed)
Pt unable to ambulate due to weakness at this time.

## 2020-08-14 ENCOUNTER — Inpatient Hospital Stay (HOSPITAL_COMMUNITY): Payer: Medicare HMO

## 2020-08-14 ENCOUNTER — Encounter (HOSPITAL_COMMUNITY): Payer: Self-pay | Admitting: Internal Medicine

## 2020-08-14 ENCOUNTER — Ambulatory Visit: Payer: Medicare HMO | Admitting: Nurse Practitioner

## 2020-08-14 DIAGNOSIS — E039 Hypothyroidism, unspecified: Secondary | ICD-10-CM

## 2020-08-14 DIAGNOSIS — N390 Urinary tract infection, site not specified: Secondary | ICD-10-CM | POA: Insufficient documentation

## 2020-08-14 DIAGNOSIS — W19XXXA Unspecified fall, initial encounter: Secondary | ICD-10-CM | POA: Insufficient documentation

## 2020-08-14 DIAGNOSIS — R627 Adult failure to thrive: Secondary | ICD-10-CM | POA: Diagnosis present

## 2020-08-14 DIAGNOSIS — R634 Abnormal weight loss: Secondary | ICD-10-CM | POA: Insufficient documentation

## 2020-08-14 DIAGNOSIS — S92242A Displaced fracture of medial cuneiform of left foot, initial encounter for closed fracture: Secondary | ICD-10-CM | POA: Diagnosis present

## 2020-08-14 LAB — CBC
HCT: 25.4 % — ABNORMAL LOW (ref 36.0–46.0)
HCT: 26.1 % — ABNORMAL LOW (ref 36.0–46.0)
Hemoglobin: 7.5 g/dL — ABNORMAL LOW (ref 12.0–15.0)
Hemoglobin: 7.8 g/dL — ABNORMAL LOW (ref 12.0–15.0)
MCH: 25.2 pg — ABNORMAL LOW (ref 26.0–34.0)
MCH: 25.3 pg — ABNORMAL LOW (ref 26.0–34.0)
MCHC: 29.5 g/dL — ABNORMAL LOW (ref 30.0–36.0)
MCHC: 29.9 g/dL — ABNORMAL LOW (ref 30.0–36.0)
MCV: 85.2 fL (ref 80.0–100.0)
MCV: 84.7 fL (ref 80.0–100.0)
Platelets: 42 10*3/uL — ABNORMAL LOW (ref 150–400)
Platelets: 38 10*3/uL — ABNORMAL LOW (ref 150–400)
RBC: 2.98 MIL/uL — ABNORMAL LOW (ref 3.87–5.11)
RBC: 3.08 MIL/uL — ABNORMAL LOW (ref 3.87–5.11)
RDW: 19.3 % — ABNORMAL HIGH (ref 11.5–15.5)
RDW: 19.2 % — ABNORMAL HIGH (ref 11.5–15.5)
WBC: 5.4 10*3/uL (ref 4.0–10.5)
WBC: 5 10*3/uL (ref 4.0–10.5)
nRBC: 0 % (ref 0.0–0.2)
nRBC: 0 % (ref 0.0–0.2)

## 2020-08-14 LAB — BODY FLUID CELL COUNT WITH DIFFERENTIAL
Lymphs, Fluid: 32 %
Monocyte-Macrophage-Serous Fluid: 41 % — ABNORMAL LOW (ref 50–90)
Neutrophil Count, Fluid: 27 % — ABNORMAL HIGH (ref 0–25)
Total Nucleated Cell Count, Fluid: 53 cu mm (ref 0–1000)

## 2020-08-14 LAB — IRON AND TIBC
Iron: 28 ug/dL (ref 28–170)
Saturation Ratios: 8 % — ABNORMAL LOW (ref 10.4–31.8)
TIBC: 340 ug/dL (ref 250–450)
UIBC: 312 ug/dL

## 2020-08-14 LAB — APTT: aPTT: 40 seconds — ABNORMAL HIGH (ref 24–36)

## 2020-08-14 LAB — PHOSPHORUS: Phosphorus: 3 mg/dL (ref 2.5–4.6)

## 2020-08-14 LAB — PROTIME-INR
INR: 1.4 — ABNORMAL HIGH (ref 0.8–1.2)
Prothrombin Time: 17 seconds — ABNORMAL HIGH (ref 11.4–15.2)

## 2020-08-14 LAB — PROTEIN, PLEURAL OR PERITONEAL FLUID: Total protein, fluid: <3 g/dL

## 2020-08-14 LAB — COMPREHENSIVE METABOLIC PANEL
ALT: 26 U/L (ref 0–44)
AST: 39 U/L (ref 15–41)
Albumin: 2.4 g/dL — ABNORMAL LOW (ref 3.5–5.0)
Alkaline Phosphatase: 115 U/L (ref 38–126)
Anion gap: 7 (ref 5–15)
BUN: 16 mg/dL (ref 8–23)
CO2: 22 mmol/L (ref 22–32)
Calcium: 8.3 mg/dL — ABNORMAL LOW (ref 8.9–10.3)
Chloride: 104 mmol/L (ref 98–111)
Creatinine, Ser: 0.5 mg/dL (ref 0.44–1.00)
GFR, Estimated: >60 mL/min (ref >60)
Glucose, Bld: 99 mg/dL (ref 70–99)
Potassium: 3.6 mmol/L (ref 3.5–5.1)
Sodium: 133 mmol/L — ABNORMAL LOW (ref 135–145)
Total Bilirubin: 2.3 mg/dL — ABNORMAL HIGH (ref 0.3–1.2)
Total Protein: 5.3 g/dL — ABNORMAL LOW (ref 6.5–8.1)

## 2020-08-14 LAB — VITAMIN B12: Vitamin B-12: 604 pg/mL (ref 180–914)

## 2020-08-14 LAB — FOLATE: Folate: 16.9 ng/mL (ref >5.9)

## 2020-08-14 LAB — FERRITIN: Ferritin: 36 ng/mL (ref 11–307)

## 2020-08-14 LAB — ALBUMIN, PLEURAL OR PERITONEAL FLUID: Albumin, Fluid: <1 g/dL

## 2020-08-14 LAB — MAGNESIUM: Magnesium: 1.9 mg/dL (ref 1.7–2.4)

## 2020-08-14 LAB — TSH: TSH: 6.506 u[IU]/mL — ABNORMAL HIGH (ref 0.350–4.500)

## 2020-08-14 MED ORDER — SPIRONOLACTONE 25 MG PO TABS
150.0000 mg | ORAL_TABLET | Freq: Every day | ORAL | Status: DC
  Administered 2020-08-14 – 2020-08-21 (×8): 150 mg via ORAL
  Filled 2020-08-14 (×9): qty 6

## 2020-08-14 MED ORDER — ENSURE ENLIVE PO LIQD
237.0000 mL | Freq: Two times a day (BID) | ORAL | Status: DC
  Administered 2020-08-14 – 2020-08-21 (×7): 237 mL via ORAL
  Filled 2020-08-14 (×2): qty 237

## 2020-08-14 MED ORDER — PROCHLORPERAZINE EDISYLATE 10 MG/2ML IJ SOLN
5.0000 mg | Freq: Four times a day (QID) | INTRAMUSCULAR | Status: DC | PRN
  Administered 2020-08-14 – 2020-08-15 (×3): 5 mg via INTRAVENOUS
  Filled 2020-08-14 (×3): qty 2

## 2020-08-14 MED ORDER — RIFAXIMIN 550 MG PO TABS
550.0000 mg | ORAL_TABLET | Freq: Two times a day (BID) | ORAL | Status: DC
  Administered 2020-08-14 – 2020-08-21 (×15): 550 mg via ORAL
  Filled 2020-08-14 (×15): qty 1

## 2020-08-14 MED ORDER — OXYCODONE HCL 5 MG PO TABS
5.0000 mg | ORAL_TABLET | Freq: Three times a day (TID) | ORAL | Status: DC | PRN
  Administered 2020-08-14 – 2020-08-16 (×8): 5 mg via ORAL
  Filled 2020-08-14 (×8): qty 1

## 2020-08-14 MED ORDER — POTASSIUM CHLORIDE ER 20 MEQ PO TBCR: 20.0000 meq | EXTENDED_RELEASE_TABLET | ORAL | Status: DC

## 2020-08-14 MED ORDER — ONDANSETRON 4 MG PO TBDP
4.0000 mg | ORAL_TABLET | Freq: Three times a day (TID) | ORAL | Status: DC | PRN
  Administered 2020-08-15 – 2020-08-18 (×6): 4 mg via ORAL
  Filled 2020-08-14 (×7): qty 1

## 2020-08-14 MED ORDER — TRAZODONE HCL 50 MG PO TABS
50.0000 mg | ORAL_TABLET | Freq: Every evening | ORAL | Status: DC | PRN
  Administered 2020-08-18 – 2020-08-19 (×2): 50 mg via ORAL
  Filled 2020-08-14 (×2): qty 1

## 2020-08-14 MED ORDER — LEVOTHYROXINE SODIUM 75 MCG PO TABS
75.0000 ug | ORAL_TABLET | Freq: Every day | ORAL | Status: DC
  Administered 2020-08-14 – 2020-08-21 (×8): 75 ug via ORAL
  Filled 2020-08-14 (×8): qty 1

## 2020-08-14 MED ORDER — LACTULOSE 10 GM/15ML PO SOLN
20.0000 g | Freq: Three times a day (TID) | ORAL | Status: DC
  Administered 2020-08-14 – 2020-08-21 (×19): 20 g via ORAL
  Filled 2020-08-14 (×21): qty 30

## 2020-08-14 MED ORDER — DICLOFENAC SODIUM 1 % EX GEL
2.0000 g | Freq: Every day | CUTANEOUS | Status: DC | PRN
  Filled 2020-08-14: qty 100

## 2020-08-14 MED ORDER — INFLUENZA VAC SPLIT QUAD 0.5 ML IM SUSY
0.5000 mL | PREFILLED_SYRINGE | INTRAMUSCULAR | Status: AC
  Administered 2020-08-15: 0.5 mL via INTRAMUSCULAR
  Filled 2020-08-14: qty 0.5

## 2020-08-14 MED ORDER — PANTOPRAZOLE SODIUM 40 MG PO TBEC
40.0000 mg | DELAYED_RELEASE_TABLET | Freq: Every day | ORAL | Status: DC
  Administered 2020-08-14 – 2020-08-21 (×8): 40 mg via ORAL
  Filled 2020-08-14 (×7): qty 1

## 2020-08-14 MED ORDER — POTASSIUM CHLORIDE CRYS ER 20 MEQ PO TBCR
20.0000 meq | EXTENDED_RELEASE_TABLET | ORAL | Status: DC
  Administered 2020-08-15: 20 meq via ORAL
  Filled 2020-08-14 (×2): qty 1

## 2020-08-14 MED ORDER — SODIUM CHLORIDE 0.9 % IV SOLN
1.0000 g | INTRAVENOUS | Status: DC
  Administered 2020-08-15 – 2020-08-19 (×5): 1 g via INTRAVENOUS
  Filled 2020-08-14 (×5): qty 10

## 2020-08-14 MED ORDER — SERTRALINE HCL 50 MG PO TABS
50.0000 mg | ORAL_TABLET | Freq: Every day | ORAL | Status: DC
  Administered 2020-08-14 – 2020-08-21 (×8): 50 mg via ORAL
  Filled 2020-08-14 (×8): qty 1

## 2020-08-14 NOTE — Procedures (Signed)
PROCEDURE SUMMARY:  Successful US guided paracentesis from left lateral abdomen.  Yielded 2.2 liters of clear yellow fluid.  No immediate complications.  Patient tolerated well.  EBL = trace  Specimen was sent for labs.  Judie Grieve Forestine Macho PA-C 08/14/2020 12:54 PM

## 2020-08-14 NOTE — Progress Notes (Signed)
[facility]  PROGRESS NOTE  Megan Roberson VEL:[id number] DOB: 13-Jan-1956 DOA: 08/13/2020 PCP: Celene Squibb, MD Admit date - 08/13/2020   Admitting Physician Bernadette Hoit, DO  Outpatient Primary MD for the patient is Celene Squibb, MD  LOS - 1 Brief Narrative   Megan Roberson is a 64 y.o. year old female with medical history significant for Pancytopenia, cirrhosis of liver, hypothyroidism, GERD who presents on 08/13/2020 with generalized weakness, nausea, recurrent falls and left foot pain and was found to have profound ascites, hepatic encephalopathy, and acute fracture of medial cuneiform of the left foot.  Patient was started on ceftriaxone due to concern for UTI given positive UA but patient denies any urinary symptoms.  Patient underwent IR guided paracentesis on 10/13 with removal of 2 L of ascitic fluid.   Subjective  Today increased nausea, decreased appetite, increased weight loss over several weeks.  As well as recurrent falls.  This morning states she still feels uncomfortable with some abdominal pain, denies any urinary symptoms, no cough, no emesis, no melena.  A & P   Decompensated cirrhosis with hepatic encephalopathy and ascites in the setting of nonadherence with lactulose therapy due to nausea/poor taste.  Now much more alert and oriented to self and place in context.  Denies any hematemesis or melena.  Has a history of esophageal varices.  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  Iron panel seems more consistent with iron deficiency.  Not having any active signs of bleeding -Closely monitor CBC, transfuse hemoglobin  less than 7, platelets less than 10,000  Generalized weakness with recurrent falls, high concern for failure to thrive Suspect general failure to thrive in setting of chronic liver disease, B12 within normal limits, TSH slightly elevated at 6.5, thiamine deficiency also possibility, electrolytes all within normal limits -Check B1 -Continue home Synthroid -PT eval given left foot fracture -Dietary consulted, protein supplementation  Abnormal UA.  Does not have any urinary symptoms.  Not likely UTI -Continue to monitor urine culture, IV ceftriaxone for SBP rule out  GERD, stable -Continue oral PPI continue IV ceftriaxone while rule out SBP above  Mood disorder, stable -Continue home Zoloft  Left minimally displaced medial cuneiform foot fracture secondary to fall Cast placed in the ED, outpatient orthopedic follow-up -Oxycodone 5 mg every 8 hours for moderate/severe pain   Family Communication  : None at bedside  Code Status : Full  Disposition Plan  :  Patient is from home. Anticipated d/c date:  1-2. Barriers to d/c or necessity for inpatient status:  Currently requiring IV ceftriaxone due to high concern for SBP, awaiting fluid analysis of ascites, also closely monitor hemoglobin, as well as improvement for sustained normal mental status in setting of hepatic encephalopathy Consults  : None  Procedures  : IR guided paracentesis, 10/13  DVT Prophylaxis  : SCDs MDM: The below labs and imaging reports were reviewed and summarized above.  Medication management as above.  Lab Results  Component Value Date   PLT 42 (L) 08/14/2020    Diet :  Diet Order            Diet Heart Room service appropriate? Yes; Fluid consistency: Thin  Diet effective now  Inpatient Medications Scheduled Meds: . feeding supplement  237 mL Oral BID BM  . [START ON 08/15/2020] influenza vac split quadrivalent PF  0.5 mL Intramuscular Tomorrow-1000  . lactulose  20 g Oral TID  .  levothyroxine  75 mcg Oral QAC breakfast  . pantoprazole  40 mg Oral Daily  . [START ON 08/15/2020] potassium chloride  20 mEq Oral Q T,Th,S,Su  . rifaximin  550 mg Oral BID  . sertraline  50 mg Oral Daily  . spironolactone  150 mg Oral Daily   Continuous Infusions: . cefTRIAXone (ROCEPHIN)  IV     PRN Meds:.diclofenac Sodium, ondansetron, oxyCODONE, prochlorperazine  Antibiotics  :   Anti-infectives (From admission, onward)   Start     Dose/Rate Route Frequency Ordered Stop   08/14/20 2359  cefTRIAXone (ROCEPHIN) 1 g in sodium chloride 0.9 % 100 mL IVPB        1 g 200 mL/hr over 30 Minutes Intravenous Every 24 hours 08/14/20 0120     08/14/20 1000  rifaximin (XIFAXAN) tablet 550 mg        550 mg Oral 2 times daily 08/14/20 0834     08/13/20 2300  cefTRIAXone (ROCEPHIN) 1 g in sodium chloride 0.9 % 100 mL IVPB        1 g 200 mL/hr over 30 Minutes Intravenous  Once 08/13/20 2257 08/14/20 0026       Objective   Vitals:   08/14/20 1151 08/14/20 1207 08/14/20 1433 08/14/20 1438  BP: 119/68 (!) 112/58 125/73 125/73  Pulse: 82 85 82 77  Resp: 18 18 16 16   Temp: 99 F (37.2 C)  99 F (37.2 C) 99 F (37.2 C)  TempSrc: Oral     SpO2: 95% 96% 95% 96%  Weight:      Height:        SpO2: 96 % O2 Flow Rate (L/min): 0 L/min FiO2 (%): 21 %  Wt Readings from Last 3 Encounters:  08/14/20 57 kg  08/06/20 54.9 kg  06/04/20 63.5 kg     Intake/Output Summary (Last 24 hours) at 08/14/2020 1552 Last data filed at 08/14/2020 0026 Gross per 24 hour  Intake 100 ml  Output --  Net 100 ml    Physical Exam:     Awake Alert, Oriented to self, place, context Thin, frail No new F.N deficits,  [place].AT, Normal respiratory effort on room air, CTAB RRR,No Gallops,Rubs or new Murmurs, Decreased abdominal sounds, soft distended abdomen, soft, no rebound tenderness Bruising along left toes   I have personally reviewed the following:   Data Reviewed:  CBC Recent Labs  Lab  08/13/20 1815 08/14/20 0621  WBC 8.1 5.4  HGB 9.2* 7.5*  HCT 29.8* 25.4*  PLT 45* 42*  MCV 85.1 85.2  MCH 26.3 25.2*  MCHC 30.9 29.5*  RDW 19.4* 19.3*  LYMPHSABS 0.7  --   MONOABS 0.9  --   EOSABS 0.2  --   BASOSABS 0.0  --     Chemistries  Recent Labs  Lab 08/13/20 1815 08/14/20 0621  NA 132* 133*  K 4.3 3.6  CL 103 104  CO2 20* 22  GLUCOSE 150* 99  BUN 18 16  CREATININE 0.60 0.50  CALCIUM 8.8* 8.3*  MG  --  1.9  AST 48* 39  ALT 29 26  ALKPHOS 156* 115  BILITOT 2.9* 2.3*   ------------------------------------------------------------------------------------------------------------------ No results for input(s): CHOL, HDL, LDLCALC, TRIG, CHOLHDL, LDLDIRECT in the last 72 hours.  No results found  for: HGBA1C ------------------------------------------------------------------------------------------------------------------ Recent Labs    08/13/20 1815  TSH 6.506*   ------------------------------------------------------------------------------------------------------------------ Recent Labs    08/13/20 1815  VITAMINB12 604  FOLATE 16.9  FERRITIN 36  TIBC 340  IRON 28    Coagulation profile Recent Labs  Lab 08/13/20 1815 08/14/20 0621  INR 1.3* 1.4*    No results for input(s): DDIMER in the last 72 hours.  Cardiac Enzymes No results for input(s): CKMB, TROPONINI, MYOGLOBIN in the last 168 hours.  Invalid input(s): CK ------------------------------------------------------------------------------------------------------------------    Component Value Date/Time   BNP 114.0 (H) 05/18/2020 2209    Micro Results Recent Results (from the past 240 hour(s))  Gram stain     Status: None   Collection Time: 08/08/20  2:24 PM   Specimen: Abdomen; Peritoneal Fluid  Result Value Ref Range Status   Specimen Description PERITONEAL ABDOMEN  Final   Special Requests NONE  Final   Gram Stain   Final    WBC PRESENT,BOTH PMN AND MONONUCLEAR NO ORGANISMS  SEEN CYTOSPIN SMEAR Performed at [facility], [address] [place], [place] [address]    Report Status 08/08/2020 FINAL  Final  Culture, body fluid-bottle     Status: None   Collection Time: 08/08/20  2:24 PM   Specimen: Fluid  Result Value Ref Range Status   Specimen Description FLUID ABDOMEN PERITONEAL  Final   Special Requests BOTTLES DRAWN AEROBIC AND ANAEROBIC  Final   Culture   Final    NO GROWTH 5 DAYS Performed at [facility], [place] [address] [place], [place] [address]    Report Status 08/13/2020 FINAL  Final  Resp Panel by RT PCR (RSV, Flu A&B, Covid) - Nasopharyngeal Swab     Status: None   Collection Time: 08/13/20  9:42 PM   Specimen: Nasopharyngeal Swab  Result Value Ref Range Status   SARS Coronavirus 2 by RT PCR NEGATIVE NEGATIVE Final    Comment: (NOTE) SARS-CoV-2 target nucleic acids are NOT DETECTED.  The SARS-CoV-2 RNA is generally detectable in upper respiratoy specimens during the acute phase of infection. The lowest concentration of SARS-CoV-2 viral copies this assay can detect is 131 copies/mL. A negative result does not preclude SARS-Cov-2 infection and should not be used as the sole basis for treatment or other patient management decisions. A negative result may occur with  improper specimen collection/handling, submission of specimen other than nasopharyngeal swab, presence of viral mutation(s) within the areas targeted by this assay, and inadequate number of viral copies (<131 copies/mL). A negative result must be combined with clinical observations, patient history, and epidemiological information. The expected result is Negative.  Fact Sheet for Patients:  PinkCheek.be  Fact Sheet for Healthcare Providers:  GravelBags.it  This test is no t yet approved or cleared by the [place] FDA and  has been authorized for detection and/or diagnosis of SARS-CoV-2 by FDA under  an Emergency Use Authorization (EUA). This EUA will remain  in effect (meaning this test can be used) for the duration of the COVID-19 declaration under Section 564(b)(1) of the Act, 21 U.S.C. section 360bbb-3(b)(1), unless the authorization is terminated or revoked sooner.     Influenza A by PCR NEGATIVE NEGATIVE Final   Influenza B by PCR NEGATIVE NEGATIVE Final    Comment: (NOTE) The Xpert Xpress SARS-CoV-2/FLU/RSV assay is intended as an aid in  the diagnosis of influenza from Nasopharyngeal swab specimens and  should not be used as a sole basis for treatment. Nasal  washings and  aspirates are unacceptable for Xpert Xpress SARS-CoV-2/FLU/RSV  testing.  Fact Sheet for Patients: PinkCheek.be  Fact Sheet for Healthcare Providers: GravelBags.it  This test is not yet approved or cleared by the [place] FDA and  has been authorized for detection and/or diagnosis of SARS-CoV-2 by  FDA under an Emergency Use Authorization (EUA). This EUA will remain  in effect (meaning this test can be used) for the duration of the  Covid-19 declaration under Section 564(b)(1) of the Act, 21  U.S.C. section 360bbb-3(b)(1), unless the authorization is  terminated or revoked.    Respiratory Syncytial Virus by PCR NEGATIVE NEGATIVE Final    Comment: (NOTE) Fact Sheet for Patients: PinkCheek.be  Fact Sheet for Healthcare Providers: GravelBags.it  This test is not yet approved or cleared by the [place] FDA and  has been authorized for detection and/or diagnosis of SARS-CoV-2 by  FDA under an Emergency Use Authorization (EUA). This EUA will remain  in effect (meaning this test can be used) for the duration of the  COVID-19 declaration under Section 564(b)(1) of the Act, 21 U.S.C.  section 360bbb-3(b)(1), unless the authorization is terminated or  revoked. Performed at [facility], [address] [place], [place] [address]     Radiology Reports CT Head Wo Contrast  Result Date: 08/13/2020 CLINICAL DATA:  Fall, head injury EXAM: CT HEAD WITHOUT CONTRAST TECHNIQUE: Contiguous axial images were obtained from the base of the skull through the vertex without intravenous contrast. COMPARISON:  None. FINDINGS: Brain: Normal anatomic configuration. Parenchymal volume loss is commensurate with the patient's age. No abnormal intra or extra-axial mass lesion or fluid collection. No abnormal mass effect or midline shift. No evidence of acute intracranial hemorrhage or infarct. Ventricular size is normal. Cerebellum unremarkable. Vascular: No asymmetric hyperdense vasculature at the skull base. Skull: Intact Sinuses/Orbits: Paranasal sinuses are clear. Orbits are unremarkable. Other: Mastoid air cells and middle ear cavities are clear. IMPRESSION: No acute intracranial injury.  No calvarial fracture. Electronically Signed   By: Fidela Salisbury MD   On: 08/13/2020 22:06   US Paracentesis  Result Date: 08/14/2020 INDICATION: Ascites secondary to hepatic cirrhosis. Request for diagnostic and therapeutic paracentesis. EXAM: ULTRASOUND GUIDED PARACENTESIS MEDICATIONS: 1% lidocaine 10 mL COMPLICATIONS: None immediate. PROCEDURE: Informed written consent was obtained from the patient after a discussion of the risks, benefits and alternatives to treatment. A timeout was performed prior to the initiation of the procedure. Initial ultrasound scanning demonstrates a moderate amount of ascites within the left lateral abdomen quadrant. The left lateral abdomen was prepped and draped in the usual sterile fashion. 1% lidocaine was used for local anesthesia. Following this, a 19 gauge, 7-cm, Yueh catheter was introduced. An ultrasound image was saved for documentation purposes. The paracentesis was performed. The catheter was removed and a dressing was applied. The patient tolerated the procedure well  without immediate post procedural complication. FINDINGS: A total of approximately 2.2 L of clear yellow fluid was removed. Samples were sent to the laboratory as requested by the clinical team. IMPRESSION: Successful ultrasound-guided paracentesis yielding 2.2 liters of peritoneal fluid. Read by: Gareth Eagle, PA-C Electronically Signed   By: Marijo Conception M.D.   On: 08/14/2020 12:53   DG Foot Complete Left  Result Date: 08/13/2020 CLINICAL DATA:  Fall, left foot bruising EXAM: LEFT FOOT - COMPLETE 3+ VIEW COMPARISON:  None. 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  There is an acute intra-articular fracture of the probable medial navicular noted dorsally with fracture fragments in near anatomic alignment. Soft tissues are unremarkable. Joint spaces appear preserved. IMPRESSION: Tiny minimally displaced fracture of the probable medial cuneiform dorsally with fracture fragments in anatomic alignment. Electronically Signed   By: Fidela Salisbury MD   On: 08/13/2020 22:31   IR Paracentesis  Result Date: 08/08/2020 INDICATION: Patient with history of NASH cirrhosis, new onset ascites. Request to IR for diagnostic and therapeutic paracentesis. EXAM: ULTRASOUND GUIDED DIAGNOSTIC AND THERAPEUTIC PARACENTESIS MEDICATIONS: 10 mL 1% lidocaine COMPLICATIONS: None immediate. PROCEDURE: Informed written consent was obtained from the patient after a discussion of the risks, benefits and alternatives to treatment. A timeout was performed prior to the initiation of the procedure. Initial ultrasound scanning demonstrates a large amount of ascites within the right lower abdominal quadrant. The right lower abdomen was prepped and draped in the usual sterile fashion. 1% lidocaine was used for local anesthesia. Following this, a 19 gauge, 7-cm, Yueh catheter was introduced. An ultrasound image was saved for documentation purposes. The  paracentesis was performed. The catheter was removed and a dressing was applied. The patient tolerated the procedure well without immediate post procedural complication. FINDINGS: A total of approximately 3.3 L of clear, light yellow fluid was removed. Samples were sent to the laboratory as requested by the clinical team. IMPRESSION: Successful ultrasound-guided paracentesis yielding 3.3 liters of peritoneal fluid. Read by Candiss Norse, PA-C Electronically Signed   By: Corrie Mckusick D.O.   On: 08/08/2020 15:24     Time Spent in minutes  30     Desiree Hane M.D on 08/14/2020 at 3:52 PM  To page go to [email or web address] - password [facility]

## 2020-08-14 NOTE — Progress Notes (Signed)
Initial Nutrition Assessment  DOCUMENTATION CODES:   Non-severe (moderate) malnutrition in context of chronic illness  INTERVENTION:  Continue Ensure Enlive po BID, each supplement provides 350 kcal and 20 grams of protein  Education provided   NUTRITION DIAGNOSIS:   Moderate Malnutrition related to chronic illness as evidenced by moderate fat depletion, moderate muscle depletion, per patient/family report, energy intake < or equal to 75% for > or equal to 1 month.    GOAL:   Patient will meet greater than or equal to 90% of their needs    MONITOR:   PO intake, Weight trends, Labs, Supplement acceptance, Skin, I & O's  REASON FOR ASSESSMENT:   Malnutrition Screening Tool, Consult Assessment of nutrition requirement/status  ASSESSMENT:  64 year old female with history significant for pancytopenia, cirrhosis of liver, multiple paracentesis, anemia, hypothyroidism, GERD, insomnia, who presented with generalized weakness, nausea, decreased appetite with recent weight loss, multiple falls and worsening left foot pain s/p fall last week.  10/7- paracentesis: 3.3 L removed  Pt resting quietly in bed this afternoon s/p paracentesis this morning that yielded 2.2 L clear yellow fluid. She endorses some abdominal pain and overall not feeling well. Pt reports chronic poor appetite at home secondary to early satiety and nausea with po intake, says her husband prepares meals and encourages her to eat. RD discussed strategies to improve daily intake. Educated on small frequent meals/snacks throughout the day, limiting sodium intake, encouraged nutrient dense supplements, recommended pt to remain upright for at least one hour after meals and recommended walking after eating to promote digestion. Pt appreciative and agreeable to drinking Ensure supplements during admission.   Pt endorses some weight loss, unsure of usual weight due to fluctuations secondary to ascites. Per chart, weights have  trended down ~14 lbs (10.2%) in the last 2 months; significant. Suspect some wt loss secondary to fluid given last paracentesis on 10/7   Medications reviewed and include: Lactulose, Protonix, Klor-con, Zoloft, Rifaximin  Labs: Na 133 (L), Hgb 7.5 (L), Hgb 25.4 (L)   NUTRITION - FOCUSED PHYSICAL EXAM: Moderate orbital, severe buccal fat depletion; Moderate temple, clavicle, clavicle and acromion, and dorsal hand muscle depletion, mild non-pitting BLE edema   Diet Order:   Diet Order            Diet Heart Room service appropriate? Yes; Fluid consistency: Thin  Diet effective now                 EDUCATION NEEDS:   Education needs have been addressed  Skin:  Skin Assessment: Reviewed RN Assessment  Last BM:  10/12  Height:   Ht Readings from Last 1 Encounters:  08/14/20 5' 7"  (1.702 m)    Weight:   Wt Readings from Last 1 Encounters:  08/14/20 57 kg    BMI:  Body mass index is 19.68 kg/m.  Estimated Nutritional Needs:   Kcal:  1825-2000  Protein:  90-100  Fluid:  >1.5 L/day   Lajuan Lines, RD, LDN Clinical Nutrition After Hours/Weekend Pager # in [place]

## 2020-08-14 NOTE — TOC Initial Note (Signed)
Transition of Care [facility]) - Initial/Assessment Note   Patient Details  Name: Megan Roberson MRN: [id number] Date of Birth: 06-23-1956  Transition of Care [facility]) CM/SW Contact:    Sherie Don, LCSW Phone Number: 08/14/2020, 1:58 PM  Clinical Narrative: Patient is a 64 year old female who was admitted for generalized weakness. Readmission checklist completed due to high readmission score. CSW spoke with patient to complete assessment. Per patient, she lives at home with her husband, Megan Roberson. Patient reported she has a walker, wheelchair, and BSC at home, but due to weakness is having difficulty completing ADLs even with her husband's assistance. Patient is able to afford her medications monthly, which she takes as prescribed. Patient reported she is agreeable to SNF if recommended by PT. TOC awaiting PT evaluation.  Expected Discharge Plan: Skilled Nursing Facility Barriers to Discharge: Continued Medical Work up  Patient Goals and CMS Choice Patient states their goals for this hospitalization and ongoing recovery are:: Get stronger at rehab CMS Medicare.gov Compare Post Acute Care list provided to:: Patient Choice offered to / list presented to : Patient  Expected Discharge Plan and Services Expected Discharge Plan: [facility] In-house Referral: Clinical Social Work Discharge Planning Services: NA Post Acute Care Choice: [place] Living arrangements for the past 2 months: [place]  Prior Living Arrangements/Services Living arrangements for the past 2 months: Single Family Home Lives with:: Spouse Patient language and need for interpreter reviewed:: Yes Do you feel safe going back to the place where you live?: Yes      Need for Family Participation in Patient Care: No (Comment) Care giver support system in place?: Yes (comment) Current home services: DME Gilford Rile, [facility], wheelchair) Criminal Activity/Legal Involvement Pertinent to Current  Situation/Hospitalization: No - Comment as needed  Activities of Daily Living Home Assistive Devices/Equipment: Gilford Rile (specify type) ADL Screening (condition at time of admission) Patient's cognitive ability adequate to safely complete daily activities?: Yes Is the patient deaf or have difficulty hearing?: No Does the patient have difficulty seeing, even when wearing glasses/contacts?: No Does the patient have difficulty concentrating, remembering, or making decisions?: No Patient able to express need for assistance with ADLs?: Yes Does the patient have difficulty dressing or bathing?: Yes Independently performs ADLs?: Yes (appropriate for developmental age) Does the patient have difficulty walking or climbing stairs?: Yes Weakness of Legs: Both Weakness of Arms/Hands: Both  Permission Sought/Granted Permission sought to share information with : Facility Art therapist granted to share information with : Yes, Verbal Permission Granted Permission granted to share info w AGENCY: SNFs  Emotional Assessment Appearance:: Appears stated age Attitude/Demeanor/Rapport: Engaged Affect (typically observed): Accepting Orientation: : Oriented to Self, Oriented to Place, Oriented to  Time, Oriented to Situation Alcohol / Substance Use: Not Applicable Psych Involvement: No (comment)  Admission diagnosis:  Abdominal distension [R14.0] Weakness [R53.1] Acute cystitis with hematuria [N30.01] Generalized weakness [R53.1] Patient Active Problem List   Diagnosis Date Noted  . Hypothyroidism 08/14/2020  . Displaced fracture of medial cuneiform of left foot, initial encounter for closed fracture 08/14/2020  . Failure to thrive in adult 08/14/2020  . Weight loss 08/14/2020  . Acute lower UTI 08/14/2020  . Fall at home, initial encounter 08/14/2020  . Generalized weakness 08/13/2020  . Pain in right knee 06/04/2020  . Pain in left hip 06/04/2020  . Nausea 05/19/2020  .  Prolonged QT interval 05/19/2020  . Elevated brain natriuretic peptide (BNP) level 05/19/2020  . Hypoalbuminemia 05/19/2020  . Cough  05/19/2020  . Atelectasis 05/19/2020  . Hypomagnesemia 05/19/2020  . Cysts of both ovaries 06/15/2019  . Closed fracture of left tibial plateau 05/17/18 07/08/2018  . 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 Osteoporosis 02/09/2018  . Colon cancer screening 01/18/2018  . Ascites 11/25/2017  . Cellulitis, abdominal wall 11/25/2017  . SOB (shortness of breath) 11/25/2017  . Protein-calorie malnutrition, severe 11/06/2017  . Ogilvie's syndrome   . Abdominal distension   . Abdominal pain   . Liver failure without hepatic coma ([place])   . Thrombocytopenia ([place])   . Closed fracture of lumbar spine without lesion of spinal cord ([place])   . Cirrhosis of liver ([place]) 04/21/2017  . Anemia 04/21/2017  . History of open reduction and internal fixation (ORIF) procedure 04/21/2017  . Visit for suture removal 04/21/2017  . Left Olecranon fracture 04/21/2017  . Hypokalemia 04/21/2017   PCP:  Celene Squibb, MD Pharmacy:   [id number] [id number]: [phone]: [phone] Readmission Risk Interventions Readmission Risk Prevention Plan 08/14/2020  Transportation Screening Complete  PCP or Specialist Appt within 3-5 Days Not Complete  Not Complete comments Anticipating discharge to SNF  [place] Consult Complete  Social Work Consult for [place]/Counseling Complete  Palliative Care Screening Not Applicable  Medication Review (Press photographer Complete  Some recent data might be hidden

## 2020-08-14 NOTE — Sedation Documentation (Signed)
PT tolerated left sided paracentesis procedure well today and 2.2L clear dark yellow fluid removed and labs sent to lab. PT verbalized understanding of post-procedure follow up instructions. PT transported back to inpatient unit by Radiology transporter at completion of procedure. PT vital signs stable and NAD noted.

## 2020-08-15 ENCOUNTER — Inpatient Hospital Stay (HOSPITAL_COMMUNITY): Payer: Medicare HMO

## 2020-08-15 ENCOUNTER — Encounter (HOSPITAL_COMMUNITY): Payer: Self-pay | Admitting: Radiology

## 2020-08-15 DIAGNOSIS — E8809 Other disorders of plasma-protein metabolism, not elsewhere classified: Secondary | ICD-10-CM

## 2020-08-15 DIAGNOSIS — R109 Unspecified abdominal pain: Secondary | ICD-10-CM

## 2020-08-15 DIAGNOSIS — D61818 Other pancytopenia: Secondary | ICD-10-CM

## 2020-08-15 DIAGNOSIS — K7290 Hepatic failure, unspecified without coma: Secondary | ICD-10-CM

## 2020-08-15 DIAGNOSIS — R531 Weakness: Secondary | ICD-10-CM

## 2020-08-15 DIAGNOSIS — E440 Moderate protein-calorie malnutrition: Secondary | ICD-10-CM

## 2020-08-15 DIAGNOSIS — R140 Abdominal distension (gaseous): Secondary | ICD-10-CM

## 2020-08-15 DIAGNOSIS — R110 Nausea: Secondary | ICD-10-CM

## 2020-08-15 DIAGNOSIS — R627 Adult failure to thrive: Secondary | ICD-10-CM

## 2020-08-15 DIAGNOSIS — D696 Thrombocytopenia, unspecified: Secondary | ICD-10-CM

## 2020-08-15 DIAGNOSIS — R8271 Bacteriuria: Secondary | ICD-10-CM | POA: Insufficient documentation

## 2020-08-15 DIAGNOSIS — N3001 Acute cystitis with hematuria: Secondary | ICD-10-CM

## 2020-08-15 DIAGNOSIS — K7200 Acute and subacute hepatic failure without coma: Principal | ICD-10-CM

## 2020-08-15 DIAGNOSIS — R634 Abnormal weight loss: Secondary | ICD-10-CM

## 2020-08-15 LAB — CBC WITH DIFFERENTIAL/PLATELET
Abs Immature Granulocytes: 0.08 10*3/uL — ABNORMAL HIGH (ref 0.00–0.07)
Basophils Absolute: 0.1 10*3/uL (ref 0.0–0.1)
Basophils Relative: 1 %
Eosinophils Absolute: 0.2 10*3/uL (ref 0.0–0.5)
Eosinophils Relative: 3 %
HCT: 26.8 % — ABNORMAL LOW (ref 36.0–46.0)
Hemoglobin: 7.9 g/dL — ABNORMAL LOW (ref 12.0–15.0)
Immature Granulocytes: 2 %
Lymphocytes Relative: 10 %
Lymphs Abs: 0.5 10*3/uL — ABNORMAL LOW (ref 0.7–4.0)
MCH: 25 pg — ABNORMAL LOW (ref 26.0–34.0)
MCHC: 29.5 g/dL — ABNORMAL LOW (ref 30.0–36.0)
MCV: 84.8 fL (ref 80.0–100.0)
Monocytes Absolute: 0.8 10*3/uL (ref 0.1–1.0)
Monocytes Relative: 14 %
Neutro Abs: 3.8 10*3/uL (ref 1.7–7.7)
Neutrophils Relative %: 70 %
Platelets: 50 10*3/uL — ABNORMAL LOW (ref 150–400)
RBC: 3.16 MIL/uL — ABNORMAL LOW (ref 3.87–5.11)
RDW: 19.4 % — ABNORMAL HIGH (ref 11.5–15.5)
WBC: 5.4 10*3/uL (ref 4.0–10.5)
nRBC: 0 % (ref 0.0–0.2)

## 2020-08-15 LAB — HEPATIC FUNCTION PANEL
ALT: 24 U/L (ref 0–44)
AST: 44 U/L — ABNORMAL HIGH (ref 15–41)
Albumin: 2.4 g/dL — ABNORMAL LOW (ref 3.5–5.0)
Alkaline Phosphatase: 105 U/L (ref 38–126)
Bilirubin, Direct: 0.7 mg/dL — ABNORMAL HIGH (ref 0.0–0.2)
Indirect Bilirubin: 1.3 mg/dL — ABNORMAL HIGH (ref 0.3–0.9)
Total Bilirubin: 2 mg/dL — ABNORMAL HIGH (ref 0.3–1.2)
Total Protein: 5.6 g/dL — ABNORMAL LOW (ref 6.5–8.1)

## 2020-08-15 LAB — PATHOLOGIST SMEAR REVIEW

## 2020-08-15 LAB — BASIC METABOLIC PANEL
Anion gap: 7 (ref 5–15)
BUN: 14 mg/dL (ref 8–23)
CO2: 22 mmol/L (ref 22–32)
Calcium: 8.1 mg/dL — ABNORMAL LOW (ref 8.9–10.3)
Chloride: 103 mmol/L (ref 98–111)
Creatinine, Ser: 0.53 mg/dL (ref 0.44–1.00)
GFR, Estimated: >60 mL/min (ref >60)
Glucose, Bld: 83 mg/dL (ref 70–99)
Potassium: 3.5 mmol/L (ref 3.5–5.1)
Sodium: 132 mmol/L — ABNORMAL LOW (ref 135–145)

## 2020-08-15 MED ORDER — IOHEXOL 300 MG/ML  SOLN
75.0000 mL | Freq: Once | INTRAMUSCULAR | Status: AC | PRN
  Administered 2020-08-15: 75 mL via INTRAVENOUS

## 2020-08-15 MED ORDER — FUROSEMIDE 40 MG PO TABS
40.0000 mg | ORAL_TABLET | Freq: Every day | ORAL | Status: DC
  Administered 2020-08-15 – 2020-08-21 (×7): 40 mg via ORAL
  Filled 2020-08-15 (×7): qty 1

## 2020-08-15 MED ORDER — IOHEXOL 9 MG/ML PO SOLN
ORAL | Status: AC
  Filled 2020-08-15: qty 1000

## 2020-08-15 NOTE — Consult Note (Addendum)
Referring Provider: [facility] Primary Care Physician:  Celene Squibb, MD Primary Gastroenterologist:  Dr.[facility])  Date of Admission: 08/13/20 Date of Consultation: 08/15/20  Reason for Consultation:  Decompensated cirrhosis  HPI:  Megan Roberson is a 64 y.o. female with a past medical history of anxiety, NASH cirrhosis, depression, heart murmur with normal LV/RV size, previous abdominal paracentesis, hypothyroidism, neuropathy, pancytopenia.  Established with primary GI for cirrhosis and hepatic encephalopathy with bleeding esophageal varices.  Patient was last seen in the GI office in [place], [place] on 08/06/2020.  At that time noted in November her potassium was low so Lasix was held and increased Aldactone to 150 mg daily.  Potassium improved to 3.5 and normal renal function.  Recent appointment for outpatient paracentesis but unable to complete due to will follow up with weakness.  Due to a misunderstanding, has been off Lasix for multiple days when it was only intended to be held for 2 days.  At that time noted she does not like the taste of lactulose but has been taking Xifaxan for hepatic encephalopathy.  Noted decreased appetite, frequent nausea with occasional vomiting.  Overall felt likely ascites accumulation though no tense ascites, however no pedal edema.  Recommended holding off on Lasix for the interim given stability off Lasix.  Also recommended range paracentesis with IV albumin if greater than 4 L removed, cell count and cytology for SBP screening.  Recent CT without liver lesions.  Recommended close follow-up in 4 weeks.  Last EGD completed 03/15/2018 which found trace to small esophageal varices without high risk stigmata that did not undergo banding due to thrombocytopenia and small size.  Nonbleeding small gastric ulcer without stigmata of bleed.  Gastritis status post biopsy to rule out H. pylori.  Recommended PPI 40 mg once daily.  Unfortunately  her gastric biopsies did not survive processing.  The patient presented to the emergency department 08/13/2020 for generalized weakness, nausea, foot pain.  Noted ongoing decreased appetite for 2 months with a weight loss of 20 pounds.  Noted multiple falls over the past couple months.  Admitted noncompliance with lactulose due to nausea.  Recent paracentesis on 08/08/2020 with 3.3 L of fluid removed, only on Aldactone.  In the ED she was hemodynamically stable, noted normocytic anemia, thrombocytopenia, hyponatremia, low albumin, elevated ammonia 131.  Alk phos elevated at 156, total bilirubin 2.9.  CT of the head unremarkable.  IV Rocephin given for presumed UTI.  Lactulose started as well for titrate to 3 loose stools a day.  During admission mental status appropriate, still significant ascites despite 2.2 L removed by paracentesis on 08/14/2020.  Suspect recurrent ascites in setting of poor adherence to diuretic therapy.  Recommended continue lactulose and rifaximin, IV Rocephin, monitor mental status, resume Lasix 40 mg and continue Aldactone 150 mg.  GI consulted due to quick reaccumulation of ascites.  Labs today with persistently low albumin at 2.4, mild elevation of AST of 44, normal ALT at 24.  Alk phos improved/normal at 105.  Bilirubin improved to 2.0.  Mild/stable hyponatremia at 132, potassium normal at 3.5, kidney function normal with creatinine 0.53.  CBC this morning with stable anemia with hemoglobin of 7.9 that is now microcytic and hypochromic.  Platelets improved to 50.  Labs from abdominal paracentesis not consistent with SBP.  Today she states she's doing ok overall.  She confirms the above history.  She agrees she did have a paracentesis a couple days ago when they took fluid out.  It seems like the fluid has come back.  With the worsening abdominal distention and fluid she notes abdominal discomfort, though no severe pain.  She is having a lot of nausea but no vomiting.  She has  only had 1 small bowel movement today which she described as "hard" despite lactulose.  No worsening swelling in her ankles.  She still has fatigue.  No other overt GI complaints.  Past Medical History:  Diagnosis Date   Anxiety    pt. denies   Arthritis    Cirrhosis of liver ([place])    Complication of anesthesia    ileus after surgery 2018   Depression    "not all the time"   Heart murmur    [facility] Normal LV/RV size, basal septal hypertophy-normal variant, LVEF 65-70%, normal LV/RV function, est RAP 5 mmHg, no sign valvular stenosis or regurg--trace MR/TR/PR   History of abdominal paracentesis    History of blood transfusion    Hypothyroidism    Ileus ([place])    Liver disease 2008   Neuropathy    Osteoporosis 02/09/2018   Pancytopenia ([place])    Pneumonia    2019ish   Thrombocytopenia ([place]) 12/01/2019   Vaginal Pap smear, abnormal     Past Surgical History:  Procedure Laterality Date   AUGMENTATION MAMMAPLASTY Bilateral    20 years ago   BIOPSY  03/15/2018   Procedure: BIOPSY;  Surgeon: Yetta Flock, MD;  Location: [facility];  Service: Gastroenterology;;  Gastric   COLONOSCOPY WITH ESOPHAGOGASTRODUODENOSCOPY (EGD) AND ESOPHAGEAL DILATION (ED)     COLONOSCOPY WITH PROPOFOL N/A 03/15/2018   Procedure: COLONOSCOPY WITH PROPOFOL;  Surgeon: Yetta Flock, MD;  Location: [facility];  Service: Gastroenterology;  Laterality: N/A;   ESOPHAGOGASTRODUODENOSCOPY (EGD) WITH PROPOFOL N/A 03/15/2018   Procedure: ESOPHAGOGASTRODUODENOSCOPY (EGD) WITH PROPOFOL;  Surgeon: Yetta Flock, MD;  Location: [facility];  Service: Gastroenterology;  Laterality: N/A;   ESOPHAGOGASTRODUODENOSCOPY W/ BANDING     Varices   FRACTURE SURGERY Left    x 2 L elbow and hip left    HARDWARE REMOVAL Left 09/17/2017   Procedure: HARDWARE REMOVAL LEFT OLECRANON;  Surgeon: Altamese Loyal, MD;  Location: [facility];  Service: Orthopedics;  Laterality: Left;   HIP SURGERY Left    from  fracture- rod in palce   IR PARACENTESIS  11/18/2017   IR PARACENTESIS  11/26/2017   IR PARACENTESIS  03/14/2018   IR PARACENTESIS  08/08/2020   KNEE SURGERY Right    open incision for ligaments   ORIF ELBOW FRACTURE Left 04/30/2017   Procedure: REVISION OPEN REDUCTION INTERNAL FIXATION (ORIF) ELBOW/OLECRANON FRACTURE;  Surgeon: Altamese Cherryvale, MD;  Location: [facility];  Service: Orthopedics;  Laterality: Left;   REPAIR EXTENSOR TENDON Left 12/29/2019   Procedure: LEFT LONG AND LEFT RING FRINGER EXTENSOR REALIGNAMENT WITH POSSIBLE TENDON TRANSFER;  Surgeon: Charlotte Crumb, MD;  Location: [facility];  Service: Orthopedics;  Laterality: Left;    Prior to Admission medications   Medication Sig Start Date End Date Taking? Authorizing Provider  denosumab (PROLIA) 60 MG/ML SOSY injection Inject 60 mg into the skin every 6 (six) months.   Yes [provider]  diclofenac Sodium (VOLTAREN) 1 % GEL Apply 2 g topically daily as needed (for pain).  04/23/20  Yes [provider]  furosemide (LASIX) 80 MG tablet Take 80 mg by mouth daily.   Yes [provider]  hydrOXYzine (ATARAX/VISTARIL) 50 MG tablet Take 50 mg by mouth at bedtime. 06/17/20  Yes [provider]  levOCARNitine (CARNITOR) 330 MG tablet Take 330 mg by mouth 3 (three) times daily.   Yes [provider]  levothyroxine (SYNTHROID, LEVOTHROID) 75 MCG tablet Take 1 tablet (75 mcg total) by mouth daily before breakfast. 12/02/17  Yes Aline August, MD  omeprazole (PRILOSEC) 20 MG capsule Take 1 capsule (20 mg total) by mouth daily. 08/06/20  Yes Willia Craze, NP  ondansetron (ZOFRAN ODT) 4 MG disintegrating tablet Take 1 tablet (4 mg total) by mouth every 8 (eight) hours as needed for nausea or vomiting. 08/06/20  Yes Willia Craze, NP  oxyCODONE (OXY IR/ROXICODONE) 5 MG immediate release tablet Take 1 tablet (5 mg total) by mouth every 12 (twelve) hours as needed for severe pain. 05/21/20  Yes Roxan Hockey, MD  Potassium Chloride ER 20 MEQ TBCR Take 20 mEq by mouth every Tuesday, Thursday, Saturday, and Sunday. 05/21/20  Yes Emokpae, Courage, MD  promethazine (PHENERGAN) 12.5 MG tablet Take 12.5 mg by mouth daily as needed. 08/13/20  Yes [provider]  rifaximin (XIFAXAN) 550 MG TABS tablet Take 1 tablet (550 mg total) by mouth 2 (two) times daily. 05/21/20  Yes Emokpae, Courage, MD  sertraline (ZOLOFT) 50 MG tablet Take 50 mg by mouth daily. 05/20/20  Yes [provider]  spironolactone (ALDACTONE) 100 MG tablet Take 1.5 tablets (150 mg total) by mouth daily. 05/21/20  Yes Roxan Hockey, MD  traZODone (DESYREL) 50 MG tablet Take 1 tablet (50 mg total) by mouth at bedtime as needed for sleep. 08/06/20  Yes Willia Craze, NP    Current Facility-Administered Medications  Medication Dose Route Frequency Provider Last Rate Last Admin   cefTRIAXone (ROCEPHIN) 1 g in sodium chloride 0.9 % 100 mL IVPB  1 g Intravenous Q24H Adefeso, Oladapo, DO   Stopping Infusion hung by another clincian at 08/15/20 0817   diclofenac Sodium (VOLTAREN) 1 % topical gel 2 g  2 g Topical Daily PRN Oretha Milch D, MD       feeding supplement (ENSURE ENLIVE) (ENSURE ENLIVE) liquid 237 mL  237 mL Oral BID BM Adefeso, Oladapo, DO   237 mL at 08/14/20 0943   furosemide (LASIX) tablet 40 mg  40 mg Oral Daily Oretha Milch D, MD       lactulose (CHRONULAC) 10 GM/15ML solution 20 g  20 g Oral TID Adefeso, Oladapo, DO   20 g at 08/15/20 0851   levothyroxine (SYNTHROID) tablet 75 mcg  75 mcg Oral QAC breakfast Adefeso, Oladapo, DO   75 mcg at 08/15/20 0537   ondansetron (ZOFRAN-ODT) disintegrating tablet 4 mg  4 mg Oral Q8H PRN Oretha Milch D, MD   4 mg at 08/15/20 0848   oxyCODONE (Oxy IR/ROXICODONE) immediate release tablet 5 mg  5 mg Oral Q8H PRN Adefeso, Oladapo, DO   5 mg at 08/15/20 0833   pantoprazole (PROTONIX) EC tablet 40 mg  40 mg Oral Daily Oretha Milch D, MD   40 mg at 08/15/20 0849    potassium chloride SA (KLOR-CON) CR tablet 20 mEq  20 mEq Oral Q T,Th,S,Su Adefeso, Oladapo, DO   20 mEq at 08/15/20 0851   prochlorperazine (COMPAZINE) injection 5 mg  5 mg Intravenous Q6H PRN Adefeso, Oladapo, DO   5 mg at 08/14/20 0302   rifaximin (XIFAXAN) tablet 550 mg  550 mg Oral BID Oretha Milch D, MD   550 mg at 08/15/20 0849   sertraline (ZOLOFT) tablet 50 mg  50 mg Oral Daily  Oretha Milch D, MD   50 mg at 08/15/20 4315   spironolactone (ALDACTONE) tablet 150 mg  150 mg Oral Daily Adefeso, Oladapo, DO   150 mg at 08/15/20 0856   traZODone (DESYREL) tablet 50 mg  50 mg Oral QHS PRN Oretha Milch D, MD        Allergies as of 08/13/2020 - Review Complete 08/13/2020  Allergen Reaction Noted   Penicillins Swelling and Other (See Comments) 04/20/2017   Tylenol [acetaminophen] Nausea Only 01/18/2018   Sulfa antibiotics Itching and Rash 04/20/2017   Tramadol Swelling and Rash 05/20/2018    Family History  Adopted: Yes  Problem Relation Age of Onset   Alcohol abuse Daughter     Social History   Socioeconomic History   Marital status: Married    Spouse name: Not on file   Number of children: 2   Years of education: Not on file   Highest education level: Not on file  Occupational History   Occupation: disabled  Tobacco Use   Smoking status: Never Smoker   Smokeless tobacco: Never Used  Scientific laboratory technician Use: Never used  Substance and Sexual Activity   Alcohol use: No   Drug use: No   Sexual activity: Not Currently    Birth control/protection: Post-menopausal  Other Topics Concern   Not on file  Social History Narrative   Not on file   Social Determinants of Health   Financial Resource Strain:    Difficulty of Paying Living Expenses: Not on file  Food Insecurity:    Worried About Estate manager/land agent of Food in the Last Year: Not on file   [employer] of Food in the Last Year: Not on file  Transportation Needs:    Lack of Transportation (Medical): Not on file   Lack of  Transportation (Non-Medical): Not on file  Physical Activity:    Days of Exercise per Week: Not on file   Minutes of Exercise per Session: Not on file  Stress:    Feeling of Stress : Not on file  Social Connections:    Frequency of Communication with Friends and Family: Not on file   Frequency of Social Gatherings with Friends and Family: Not on file   Attends Religious Services: Not on file   Active Member of Clubs or Organizations: Not on file   Attends Archivist Meetings: Not on file   Marital Status: Not on file  Intimate Partner Violence:    Fear of Current or Ex-Partner: Not on file   Emotionally Abused: Not on file   Physically Abused: Not on file   Sexually Abused: Not on file    Review of Systems: General: Negative for anorexia, weight loss, fever, chills. Eyes: Negative for vision changes.  ENT: Negative for hoarseness, difficulty swallowing , nasal congestion. CV: Negative for chest pain, angina, palpitations, dyspnea on exertion, peripheral edema.  Respiratory: Negative for dyspnea at rest, cough, sputum, wheezing.  GI: See history of present illness. Derm: Negative for rash or itching.  Neuro: Negative for memory loss, confusion.  Endo: Negative for unusual weight change.  Heme: Negative for bruising or bleeding. Allergy: Negative for rash or hives.  Physical Exam: Vital signs in last 24 hours: Temp:  [98.7 F (37.1 C)-99.1 F (37.3 C)] 98.8 F (37.1 C) (10/14 0900) Pulse Rate:  [76-82] 80 (10/14 0900) Resp:  [16-18] 17 (10/14 0900) BP: (110-127)/(71-79) 110/79 (10/14 0933) SpO2:  [93 %-97 %] 96 % (10/14 0900) Last BM Date: 08/13/20  General:   Alert,  Well-developed, well-nourished, pleasant and cooperative in NAD Head:  Normocephalic and atraumatic. Eyes:  Sclera clear, no icterus. Conjunctiva pink. Ears:  Normal auditory acuity. Neck:  Supple; no masses or thyromegaly. Lungs:  Clear throughout to auscultation. No wheezes, crackles, or  rhonchi. No acute distress. Heart:  Regular rate and rhythm; no murmurs, clicks, rubs,  or gallops. Abdomen:  Soft, nontender. Noted abdominal distension but no tense ascites. No masses, hepatosplenomegaly or hernias noted. Normal bowel sounds, without guarding, and without rebound.   Rectal:  Deferred.   Msk:  Symmetrical without gross deformities. Pulses:  Normal bilateral pulses noted. Extremities:  Without clubbing or edema. Neurologic:  Alert and  oriented x4;  grossly normal neurologically. Skin:  Intact without significant lesions or rashes. Psych:  Alert and cooperative. Normal mood and affect.  Intake/Output from previous day: 10/13 0701 - 10/14 0700 In: 100 [IV Piggyback:100] Out: -  Intake/Output this shift: No intake/output data recorded.  Lab Results: Recent Labs    08/14/20 0621 08/14/20 1504 08/15/20 0616  WBC 5.4 5.0 5.4  HGB 7.5* 7.8* 7.9*  HCT 25.4* 26.1* 26.8*  PLT 42* 38* 50*   BMET Recent Labs    08/13/20 1815 08/14/20 0621 08/15/20 0616  NA 132* 133* 132*  K 4.3 3.6 3.5  CL 103 104 103  CO2 20* 22 22  GLUCOSE 150* 99 83  BUN 18 16 14   CREATININE 0.60 0.50 0.53  CALCIUM 8.8* 8.3* 8.1*   LFT Recent Labs    08/13/20 1815 08/14/20 0621  PROT 6.1* 5.3*  ALBUMIN 2.7* 2.4*  AST 48* 39  ALT 29 26  ALKPHOS 156* 115  BILITOT 2.9* 2.3*   PT/INR Recent Labs    08/13/20 1815 08/14/20 0621  LABPROT 15.7* 17.0*  INR 1.3* 1.4*   Hepatitis Panel No results for input(s): HEPBSAG, HCVAB, HEPAIGM, HEPBIGM in the last 72 hours. C-Diff No results for input(s): CDIFFTOX in the last 72 hours.  Studies/Results: CT Head Wo Contrast  Result Date: 08/13/2020 CLINICAL DATA:  Fall, head injury EXAM: CT HEAD WITHOUT CONTRAST TECHNIQUE: Contiguous axial images were obtained from the base of the skull through the vertex without intravenous contrast. COMPARISON:  None. FINDINGS: Brain: Normal anatomic configuration. Parenchymal volume loss is commensurate  with the patient's age. No abnormal intra or extra-axial mass lesion or fluid collection. No abnormal mass effect or midline shift. No evidence of acute intracranial hemorrhage or infarct. Ventricular size is normal. Cerebellum unremarkable. Vascular: No asymmetric hyperdense vasculature at the skull base. Skull: Intact Sinuses/Orbits: Paranasal sinuses are clear. Orbits are unremarkable. Other: Mastoid air cells and middle ear cavities are clear. IMPRESSION: No acute intracranial injury.  No calvarial fracture. Electronically Signed   By: Fidela Salisbury MD   On: 08/13/2020 22:06   US Paracentesis  Result Date: 08/14/2020 INDICATION: Ascites secondary to hepatic cirrhosis. Request for diagnostic and therapeutic paracentesis. EXAM: ULTRASOUND GUIDED PARACENTESIS MEDICATIONS: 1% lidocaine 10 mL COMPLICATIONS: None immediate. PROCEDURE: Informed written consent was obtained from the patient after a discussion of the risks, benefits and alternatives to treatment. A timeout was performed prior to the initiation of the procedure. Initial ultrasound scanning demonstrates a moderate amount of ascites within the left lateral abdomen quadrant. The left lateral abdomen was prepped and draped in the usual sterile fashion. 1% lidocaine was used for local anesthesia. Following this, a 19 gauge, 7-cm, Yueh catheter was introduced. An ultrasound image was saved for documentation purposes. The paracentesis was performed. The  catheter was removed and a dressing was applied. The patient tolerated the procedure well without immediate post procedural complication. FINDINGS: A total of approximately 2.2 L of clear yellow fluid was removed. Samples were sent to the laboratory as requested by the clinical team. IMPRESSION: Successful ultrasound-guided paracentesis yielding 2.2 liters of peritoneal fluid. Read by: Gareth Eagle, PA-C Electronically Signed   By: Marijo Conception M.D.   On: 08/14/2020 12:53   DG Abd Portable 1V  Result  Date: 08/15/2020 CLINICAL DATA:  Abdominal pain and distension EXAM: PORTABLE ABDOMEN - 1 VIEW COMPARISON:  CT abdomen and pelvis July 07, 2020 FINDINGS: There is mild dilatation of loops of colon. There is mild wall thickening in the descending colon which may have inflammatory etiology. There is moderate stool in the colon. There is no appreciable air-fluid levels. There are gallstones in the right upper quadrant. There is a calcification in the region of the right renal pelvis measuring 1.2 x 0.8 cm. There is postoperative change in the proximal femur. No evident free air. Visualized lung bases are. IMPRESSION: Question a degree of colitis. No bowel obstruction appreciable. No free air. Gallstones evident in right upper quadrant. Visualized lung bases clear. Calcification in the region of the right renal pelvis measuring 1.2 x 0.8 cm. Obstruction at or near the right ureteropelvic junction from this apparent calculus cannot be excluded by radiography. Electronically Signed   By: Lowella Grip III M.D.   On: 08/15/2020 12:50   DG Foot Complete Left  Result Date: 08/13/2020 CLINICAL DATA:  Fall, left foot bruising EXAM: LEFT FOOT - COMPLETE 3+ VIEW COMPARISON:  None. 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 There is an acute intra-articular fracture of the probable medial navicular noted dorsally with fracture fragments in near anatomic alignment. Soft tissues are unremarkable. Joint spaces appear preserved. IMPRESSION: Tiny minimally displaced fracture of the probable medial cuneiform dorsally with fracture fragments in anatomic alignment. Electronically Signed   By: Fidela Salisbury MD   On: 08/13/2020 22:31    Impression: Very pleasant 64 year old female who appears weak and frail.  She has a chronic history of [name] cirrhosis with noncompliance with lactulose.  Not a candidate for TIPS procedure given history  of hepatic encephalopathy.  EGD 2 years ago with trace to small esophageal varices (does not appear to be on nonselective beta-blocker).  Overall decompensated cirrhosis due to Candor.    She was admitted with worsening weakness and recent falls.  CT of the head negative.  Her diuretics had recently been changed to continue spironolactone 150 mg, hold Lasix 40 mg.  On presentation she was noted to have a distended abdomen.  She underwent abdominal paracentesis yesterday which removed 2.2 L.  Labs negative for SBP.  Albumin is low.  Initially at baseline hemoglobin but has since declined and now microscopic and hyperchromic.  No obvious bleeding per the patient.  Hepatic encephalopathy - Mental status currently at baseline.  She is on lactulose and Xifaxan.  However, she is only had one bowel movement today which she notes is hard despite lactulose.  Will discuss with nursing about increasing lactulose for 3 soft/loose bowel movement today.  Continue Xifaxan as well.  She is oriented x3 today.  Ascites - She has recently been placed back on Lasix 40 mg in addition to Aldactone 150 mg.  No lower extremity edema.  She does seem to have some reaccumulation  of her ascites in the abdomen, but no tense ascites.  She is scheduled for a CT of the abdomen today likely to investigate possible colitis on abdominal x-ray.  This also let us know about the degree of ascites reaccumulation.  She may need to have right upper quadrant ultrasound with Doppler to investigate for possible PVT given rapid reaccumulation of fluid.  May be a candidate for IV albumin/Lasix rounds.  Further recommendations will follow.  Decompensated cirrhosis - Other than complications as noted above, she appears to be near baseline. MELD today 17, Child-Pugh Class C (On 05/19/20 her MELD was 18. Child-Pugh B/C). She may need NSBB for history of small varices, not a candidate for TIPS.   Plan: Titrate lactulose for 3-4 soft/loose bowel movements a  day Continue Xifaxan Continue PPI Follow for CT abdomen results Consider RUQ U/S with doppler for PVT evaluation Monitor for any GI bleed Monitor for change in mental status May need repeat paracentesis pending degree of ascites on imaging May benefit from IV Albumin/Lasix; will discuss with Dr. Gala Romney Supportive measures   Thank you for allowing [place] to participate in the care of Nat Math, DNP, AGNP-C Adult & Gerontological Nurse Practitioner [facility]   LOS: 2 days     08/15/2020, 1:26 PM   Attending note:    Agree with assessment and recommendations as outlined.  She may or may not benefit from albumin dosing. We will reassess her fluid status tomorrow morning and decide

## 2020-08-15 NOTE — Progress Notes (Signed)
[facility]  PROGRESS NOTE  Megan Roberson ZYS:[id number] DOB: 1955-12-18 DOA: 08/13/2020 PCP: Celene Squibb, MD Admit date - 08/13/2020   Admitting Physician Bernadette Hoit, DO  Outpatient Primary MD for the patient is Celene Squibb, MD  LOS - 2 Brief Narrative   Megan Roberson is a 64 y.o. year old female with medical history significant for Pancytopenia, cirrhosis of liver, hypothyroidism, GERD who presents on 08/13/2020 with generalized weakness, nausea, recurrent falls and left foot pain and was found to have profound ascites, hepatic encephalopathy, and acute fracture of medial cuneiform of the left foot.  Patient was started on ceftriaxone due to concern for UTI given positive UA but patient denies any urinary symptoms.  Patient underwent IR guided paracentesis on 10/13 with removal of 2 L of ascitic fluid.  Of note, underwent outpatient IR paracentesis with 3.3 L removed on 10/7  Hospital course complicated by refractory, quick accumulating ascites   Subjective  Today increased nausea, decreased appetite, increased weight loss over several weeks.  As well as recurrent falls.  This morning states she still feels uncomfortable with some abdominal pain, denies any urinary symptoms, no cough, no emesis, no melena.  A & P   Decompensated cirrhosis with hepatic encephalopathy and ascites in the setting of nonadherence with lactulose therapy due to nausea/poor taste as well as diuretics.  Mental status is appropriate, still has significant ascites on exam that is worsened since yesterday despite 2.2 L being removed by IR paracentesis on 10/13.  Repeat x-ray shows concern for potential colitis but no signs of obstruction.  Suspect recurrent ascites in setting of poor adherence to diuretic therapy.  Denies any hematemesis or melena.   -continue lactulose, rifaximin -Continue IV ceftriaxone in case of SBP, follow-up ascitic fluid analysis -Continue to monitor mental status -Continue home  spironolactone, resume Lasix at 40 mg (decreased from home regimen of 80 mg) given recurrent ascites 24 hours after paracentesis -GI consulted given quick accumulation of ascites  Abdominal pain likely multifactorial including recurrent ascites as well as concern for potential colitis.  Based off recent abdominal x-ray.  No signs of obstruction. -Will obtain CT abdomen for further evaluation.  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  Iron panel seems more consistent with iron deficiency.  Not having any active signs of bleeding -Closely monitor CBC, transfuse hemoglobin less than 7, platelets less than 10,000  Generalized weakness with recurrent falls, high concern for failure to thrive Suspect general failure to thrive in setting of chronic liver disease, B12 within normal limits, TSH slightly elevated at 6.5, thiamine deficiency also possibility, electrolytes all within normal limits -Check B1 -Continue home Synthroid -PT eval given left foot fracture -Dietary consulted, protein supplementation  Moderate malnutrition, related to chronic illness.  As evident by moderate fat/muscle depletion -Appreciate nutrition recommendations -We will continue Ensure p.o. twice daily as well as supplementation  Abnormal UA.  Denies any urinary symptoms to me, not likely UTI -Continue to monitor urine culture, IV ceftriaxone for now  GERD, stable -Continue oral PPI  Mood disorder, stable -Continue home Zoloft  Left minimally displaced medial cuneiform foot fracture secondary to fall Cast placed in the ED, outpatient orthopedic follow-up -Oxycodone 5 mg every 8 hours for moderate/severe pain   Family Communication  : None at bedside  Code Status : Full  Disposition Plan  :  Patient is from home. Anticipated d/c date:  2 to 3 days. Barriers to d/c or necessity for  inpatient status:  Currently requiring close monitoring of ascites given quickly reaccumulation despite recent paracentesis, needs better optimization of diuretic therapy, also getting CT abdomen for assessment of potential colitis which may warrant IV antibiotics Consults  : None  Procedures  : IR guided paracentesis, 10/13  DVT Prophylaxis  : SCDs MDM: The below labs and imaging reports were reviewed and summarized above.  Medication management as above.  Lab Results  Component Value Date   PLT 50 (L) 08/15/2020    Diet :  Diet Order            Diet 2 gram sodium Room service appropriate? Yes; Fluid consistency: Thin  Diet effective now                  Inpatient Medications Scheduled Meds: . feeding supplement  237 mL Oral BID BM  . furosemide  40 mg Oral Daily  . lactulose  20 g Oral TID  . levothyroxine  75 mcg Oral QAC breakfast  . pantoprazole  40 mg Oral Daily  . potassium chloride  20 mEq Oral Q T,Th,S,Su  . rifaximin  550 mg Oral BID  . sertraline  50 mg Oral Daily  . spironolactone  150 mg Oral Daily   Continuous Infusions: . cefTRIAXone (ROCEPHIN)  IV Stopped (08/15/20 0817)   PRN Meds:.diclofenac Sodium, ondansetron, oxyCODONE, prochlorperazine, traZODone  Antibiotics  :   Anti-infectives (From admission, onward)   Start     Dose/Rate Route Frequency Ordered Stop   08/14/20 2359  cefTRIAXone (ROCEPHIN) 1 g in sodium chloride 0.9 % 100 mL IVPB        1 g 200 mL/hr over 30 Minutes Intravenous Every 24 hours 08/14/20 0120     08/14/20 1000  rifaximin (XIFAXAN) tablet 550 mg        550 mg Oral 2 times daily 08/14/20 0834     08/13/20 2300  cefTRIAXone (ROCEPHIN) 1 g in sodium chloride 0.9 % 100 mL IVPB        1 g 200 mL/hr over 30 Minutes Intravenous  Once 08/13/20 2257 08/14/20 0026       Objective   Vitals:   08/14/20 2048 08/15/20 0442 08/15/20 0900 08/15/20 0933  BP: 116/72 127/73 114/71 110/79  Pulse: 77 76 80   Resp: 16 18 17    Temp: 99.1  F (37.3 C) 98.7 F (37.1 C) 98.8 F (37.1 C)   TempSrc: Oral Oral Oral   SpO2: 97% 93% 96%   Weight:      Height:        SpO2: 96 % O2 Flow Rate (L/min): 0 L/min FiO2 (%): 21 %  Wt Readings from Last 3 Encounters:  08/14/20 57 kg  08/06/20 54.9 kg  06/04/20 63.5 kg     Intake/Output Summary (Last 24 hours) at 08/15/2020 1320 Last data filed at 08/15/2020 0500 Gross per 24 hour  Intake 100 ml  Output --  Net 100 ml    Physical Exam:     Awake Alert, Oriented to self, place, context Thin, frail No new F.N deficits,  .AT, Normal respiratory effort on room air, CTAB RRR,No Gallops,Rubs or new Murmurs, Decreased abdominal sounds, abdomen distended much more than yesterday, remains soft, tenderness in all quadrants, no rebound tenderness, no guarding Bruising along left toes   I have personally reviewed the following:   Data Reviewed:  CBC Recent Labs  Lab 08/13/20 1815 08/14/20 1975  08/14/20 1504 08/15/20 0616  WBC 8.1 5.4 5.0 5.4  HGB 9.2* 7.5* 7.8* 7.9*  HCT 29.8* 25.4* 26.1* 26.8*  PLT 45* 42* 38* 50*  MCV 85.1 85.2 84.7 84.8  MCH 26.3 25.2* 25.3* 25.0*  MCHC 30.9 29.5* 29.9* 29.5*  RDW 19.4* 19.3* 19.2* 19.4*  LYMPHSABS 0.7  --   --  0.5*  MONOABS 0.9  --   --  0.8  EOSABS 0.2  --   --  0.2  BASOSABS 0.0  --   --  0.1    Chemistries  Recent Labs  Lab 08/13/20 1815 08/14/20 0621 08/15/20 0616  NA 132* 133* 132*  K 4.3 3.6 3.5  CL 103 104 103  CO2 20* 22 22  GLUCOSE 150* 99 83  BUN 18 16 14   CREATININE 0.60 0.50 0.53  CALCIUM 8.8* 8.3* 8.1*  MG  --  1.9  --   AST 48* 39  --   ALT 29 26  --   ALKPHOS 156* 115  --   BILITOT 2.9* 2.3*  --    ------------------------------------------------------------------------------------------------------------------ No results for input(s): CHOL, HDL, LDLCALC, TRIG, CHOLHDL, LDLDIRECT in the last 72 hours.  No results found for:  HGBA1C ------------------------------------------------------------------------------------------------------------------ Recent Labs    08/13/20 1815  TSH 6.506*   ------------------------------------------------------------------------------------------------------------------ Recent Labs    08/13/20 1815  VITAMINB12 604  FOLATE 16.9  FERRITIN 36  TIBC 340  IRON 28    Coagulation profile Recent Labs  Lab 08/13/20 1815 08/14/20 0621  INR 1.3* 1.4*    No results for input(s): DDIMER in the last 72 hours.  Cardiac Enzymes No results for input(s): CKMB, TROPONINI, MYOGLOBIN in the last 168 hours.  Invalid input(s): CK ------------------------------------------------------------------------------------------------------------------    Component Value Date/Time   BNP 114.0 (H) 05/18/2020 2209    Micro Results Recent Results (from the past 240 hour(s))  Gram stain     Status: None   Collection Time: 08/08/20  2:24 PM   Specimen: Abdomen; Peritoneal Fluid  Result Value Ref Range Status   Specimen Description PERITONEAL ABDOMEN  Final   Special Requests NONE  Final   Gram Stain   Final    WBC PRESENT,BOTH PMN AND MONONUCLEAR NO ORGANISMS SEEN CYTOSPIN SMEAR Performed at [facility], [address] [place], [place] [address]    Report Status 08/08/2020 FINAL  Final  Culture, body fluid-bottle     Status: None   Collection Time: 08/08/20  2:24 PM   Specimen: Fluid  Result Value Ref Range Status   Specimen Description FLUID ABDOMEN PERITONEAL  Final   Special Requests BOTTLES DRAWN AEROBIC AND ANAEROBIC  Final   Culture   Final    NO GROWTH 5 DAYS Performed at [facility], [place] [address], [place], [place] [address]    Report Status 08/13/2020 FINAL  Final  Resp Panel by RT PCR (RSV, Flu A&B, Covid) - Nasopharyngeal Swab     Status: None   Collection Time: 08/13/20  9:42 PM   Specimen: Nasopharyngeal Swab  Result Value Ref Range Status   SARS  Coronavirus 2 by RT PCR NEGATIVE NEGATIVE Final    Comment: (NOTE) SARS-CoV-2 target nucleic acids are NOT DETECTED.  The SARS-CoV-2 RNA is generally detectable in upper respiratoy specimens during the acute phase of infection. The lowest concentration of SARS-CoV-2 viral copies this assay can detect is 131 copies/mL. A negative result does not preclude SARS-Cov-2 infection and should not be used as the sole basis for  treatment or other patient management decisions. A negative result may occur with  improper specimen collection/handling, submission of specimen other than nasopharyngeal swab, presence of viral mutation(s) within the areas targeted by this assay, and inadequate number of viral copies (<131 copies/mL). A negative result must be combined with clinical observations, patient history, and epidemiological information. The expected result is Negative.  Fact Sheet for Patients:  PinkCheek.be  Fact Sheet for Healthcare Providers:  GravelBags.it  This test is no t yet approved or cleared by the [place] FDA and  has been authorized for detection and/or diagnosis of SARS-CoV-2 by FDA under an Emergency Use Authorization (EUA). This EUA will remain  in effect (meaning this test can be used) for the duration of the COVID-19 declaration under Section 564(b)(1) of the Act, 21 U.S.C. section 360bbb-3(b)(1), unless the authorization is terminated or revoked sooner.     Influenza A by PCR NEGATIVE NEGATIVE Final   Influenza B by PCR NEGATIVE NEGATIVE Final    Comment: (NOTE) The Xpert Xpress SARS-CoV-2/FLU/RSV assay is intended as an aid in  the diagnosis of influenza from Nasopharyngeal swab specimens and  should not be used as a sole basis for treatment. Nasal washings and  aspirates are unacceptable for Xpert Xpress SARS-CoV-2/FLU/RSV  testing.  Fact Sheet for  Patients: PinkCheek.be  Fact Sheet for Healthcare Providers: GravelBags.it  This test is not yet approved or cleared by the [place] FDA and  has been authorized for detection and/or diagnosis of SARS-CoV-2 by  FDA under an Emergency Use Authorization (EUA). This EUA will remain  in effect (meaning this test can be used) for the duration of the  Covid-19 declaration under Section 564(b)(1) of the Act, 21  U.S.C. section 360bbb-3(b)(1), unless the authorization is  terminated or revoked.    Respiratory Syncytial Virus by PCR NEGATIVE NEGATIVE Final    Comment: (NOTE) Fact Sheet for Patients: PinkCheek.be  Fact Sheet for Healthcare Providers: GravelBags.it  This test is not yet approved or cleared by the [place] FDA and  has been authorized for detection and/or diagnosis of SARS-CoV-2 by  FDA under an Emergency Use Authorization (EUA). This EUA will remain  in effect (meaning this test can be used) for the duration of the  COVID-19 declaration under Section 564(b)(1) of the Act, 21 U.S.C.  section 360bbb-3(b)(1), unless the authorization is terminated or  revoked. Performed at [facility], [address], [place], [place] [address]   Urine culture     Status: Abnormal (Preliminary result)   Collection Time: 08/13/20 10:52 PM   Specimen: Urine, Random  Result Value Ref Range Status   Specimen Description   Final    URINE, RANDOM Performed at [facility], [address] [place], [place] [address]    Special Requests   Final    NONE Performed at [facility], [address] [place], [place] [address]    Culture (A)  Final    >=100,000 COLONIES/mL CITROBACTER FREUNDII SUSCEPTIBILITIES TO FOLLOW Performed at [facility], [place] [address] [place], [place] [address]    Report Status PENDING  Incomplete  Body fluid culture     Status: None  (Preliminary result)   Collection Time: 08/14/20 11:36 AM   Specimen: Abdomen; Peritoneal Fluid  Result Value Ref Range Status   Specimen Description   Final    ABDOMEN Performed at [facility], [address], [place], [place] [address]    Special Requests   Final    NONE Performed  at [facility], [address], [place], [place] [address]    Gram Stain NO WBC SEEN NO ORGANISMS SEEN   Final   Culture   Final    NO GROWTH < 12 HOURS Performed at [facility], [place] [address], [place], [place] [address]    Report Status PENDING  Incomplete    Radiology Reports CT Head Wo Contrast  Result Date: 08/13/2020 CLINICAL DATA:  Fall, head injury EXAM: CT HEAD WITHOUT CONTRAST TECHNIQUE: Contiguous axial images were obtained from the base of the skull through the vertex without intravenous contrast. COMPARISON:  None. FINDINGS: Brain: Normal anatomic configuration. Parenchymal volume loss is commensurate with the patient's age. No abnormal intra or extra-axial mass lesion or fluid collection. No abnormal mass effect or midline shift. No evidence of acute intracranial hemorrhage or infarct. Ventricular size is normal. Cerebellum unremarkable. Vascular: No asymmetric hyperdense vasculature at the skull base. Skull: Intact Sinuses/Orbits: Paranasal sinuses are clear. Orbits are unremarkable. Other: Mastoid air cells and middle ear cavities are clear. IMPRESSION: No acute intracranial injury.  No calvarial fracture. Electronically Signed   By: Fidela Salisbury MD   On: 08/13/2020 22:06   US Paracentesis  Result Date: 08/14/2020 INDICATION: Ascites secondary to hepatic cirrhosis. Request for diagnostic and therapeutic paracentesis. EXAM: ULTRASOUND GUIDED PARACENTESIS MEDICATIONS: 1% lidocaine 10 mL COMPLICATIONS: None immediate. PROCEDURE: Informed written consent was obtained from the patient after a discussion of the risks, benefits and alternatives to treatment. A timeout was performed prior  to the initiation of the procedure. Initial ultrasound scanning demonstrates a moderate amount of ascites within the left lateral abdomen quadrant. The left lateral abdomen was prepped and draped in the usual sterile fashion. 1% lidocaine was used for local anesthesia. Following this, a 19 gauge, 7-cm, Yueh catheter was introduced. An ultrasound image was saved for documentation purposes. The paracentesis was performed. The catheter was removed and a dressing was applied. The patient tolerated the procedure well without immediate post procedural complication. FINDINGS: A total of approximately 2.2 L of clear yellow fluid was removed. Samples were sent to the laboratory as requested by the clinical team. IMPRESSION: Successful ultrasound-guided paracentesis yielding 2.2 liters of peritoneal fluid. Read by: Gareth Eagle, PA-C Electronically Signed   By: Marijo Conception M.D.   On: 08/14/2020 12:53   DG Abd Portable 1V  Result Date: 08/15/2020 CLINICAL DATA:  Abdominal pain and distension EXAM: PORTABLE ABDOMEN - 1 VIEW COMPARISON:  CT abdomen and pelvis July 07, 2020 FINDINGS: There is mild dilatation of loops of colon. There is mild wall thickening in the descending colon which may have inflammatory etiology. There is moderate stool in the colon. There is no appreciable air-fluid levels. There are gallstones in the right upper quadrant. There is a calcification in the region of the right renal pelvis measuring 1.2 x 0.8 cm. There is postoperative change in the proximal femur. No evident free air. Visualized lung bases are. IMPRESSION: Question a degree of colitis. No bowel obstruction appreciable. No free air. Gallstones evident in right upper quadrant. Visualized lung bases clear. Calcification in the region of the right renal pelvis measuring 1.2 x 0.8 cm. Obstruction at or near the right ureteropelvic junction from this apparent calculus cannot be excluded by radiography. Electronically Signed   By: Lowella Grip III M.D.   On: 08/15/2020 12:50   DG Foot Complete Left  Result Date: 08/13/2020 CLINICAL DATA:  Fall, left foot bruising EXAM: LEFT FOOT - COMPLETE 3+ VIEW  COMPARISON:  None. 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 There is an acute intra-articular fracture of the probable medial navicular noted dorsally with fracture fragments in near anatomic alignment. Soft tissues are unremarkable. Joint spaces appear preserved. IMPRESSION: Tiny minimally displaced fracture of the probable medial cuneiform dorsally with fracture fragments in anatomic alignment. Electronically Signed   By: Fidela Salisbury MD   On: 08/13/2020 22:31   IR Paracentesis  Result Date: 08/08/2020 INDICATION: Patient with history of NASH cirrhosis, new onset ascites. Request to IR for diagnostic and therapeutic paracentesis. EXAM: ULTRASOUND GUIDED DIAGNOSTIC AND THERAPEUTIC PARACENTESIS MEDICATIONS: 10 mL 1% lidocaine COMPLICATIONS: None immediate. PROCEDURE: Informed written consent was obtained from the patient after a discussion of the risks, benefits and alternatives to treatment. A timeout was performed prior to the initiation of the procedure. Initial ultrasound scanning demonstrates a large amount of ascites within the right lower abdominal quadrant. The right lower abdomen was prepped and draped in the usual sterile fashion. 1% lidocaine was used for local anesthesia. Following this, a 19 gauge, 7-cm, Yueh catheter was introduced. An ultrasound image was saved for documentation purposes. The paracentesis was performed. The catheter was removed and a dressing was applied. The patient tolerated the procedure well without immediate post procedural complication. FINDINGS: A total of approximately 3.3 L of clear, light yellow fluid was removed. Samples were sent to the laboratory as requested by the clinical team. IMPRESSION: Successful  ultrasound-guided paracentesis yielding 3.3 liters of peritoneal fluid. Read by Candiss Norse, PA-C Electronically Signed   By: Corrie Mckusick D.O.   On: 08/08/2020 15:24     Time Spent in minutes  30     Desiree Hane M.D on 08/15/2020 at 1:20 PM  To page go to [email or web address] - password [facility]

## 2020-08-15 NOTE — Plan of Care (Signed)
  Problem: Acute Rehab PT Goals(only PT should resolve) Goal: Pt Will Go Supine/Side To Sit Outcome: Progressing Flowsheets (Taken 08/15/2020 1548) Pt will go Supine/Side to Sit: with modified independence Goal: Patient Will Transfer Sit To/From Stand Outcome: Progressing Flowsheets (Taken 08/15/2020 1548) Patient will transfer sit to/from stand: with minimal assist Goal: Pt Will Transfer Bed To Chair/Chair To Bed Outcome: Progressing Flowsheets (Taken 08/15/2020 1548) Pt will Transfer Bed to Chair/Chair to Bed:  with min assist  with mod assist Goal: Pt Will Ambulate Outcome: Progressing Flowsheets (Taken 08/15/2020 1548) Pt will Ambulate:  25 feet  with moderate assist  with rolling walker  3:49 PM, 08/15/20 Lonell Grandchild, MPT Physical Therapist with [facility] 336 [phone] office [phone] mobile phone

## 2020-08-15 NOTE — Evaluation (Signed)
Physical Therapy Evaluation Patient Details Name: Waverley Krempasky MRN: [id number] DOB: 1956/10/27 Today's Date: 08/15/2020   History of Present Illness  Derek Huneycutt is a 64 y.o. female with medical history significant for  pancytopenia, cirrhosis of the liver, anemia, hypothyroidism, GERD and insomnia and multiple fractures who presents to the Emergency Department  due to generalized weakness, nausea and left foot pain.  She complained of decreased appetite since last 2 months with loss of 20 pounds and about 1 month of increasing and worsening generalized weakness which has progressed to inability to walk with a walker despite assistance by her husband within last several days.  She endorsed history of multiple falls with last fall being last week and worsening left foot pain since the fall.  Patient states that she has not been compliant with her lactulose due to nausea, she denies vomiting, fevers, headache, chest pain, shortness of breath.  She had a recent paracentesis on 10/7 during which 3.3 L of clear, light yellow fluid was removed.  Patient states that she only takes Aldactone and that she was asked by her GI to stop taking Lasix due to low potassium when on Lasix.    Clinical Impression  Patient demonstrates slow labored movement for completing sit to stands, at high risk for falls due to poor standing balance, poor tolerance for weightbearing on left secondary to increased pain and limited to a few unsteady labored steps at bedside.  Patient tolerated sitting up on [facility] to have bowel movement after therapy - RN notified.  Patient will benefit from continued physical therapy in hospital and recommended venue below to increase strength, balance, endurance for safe ADLs and gait.    Follow Up Recommendations SNF    Equipment Recommendations  None recommended by PT    Recommendations for Other Services       Precautions / Restrictions Precautions Precautions:  Fall Restrictions Weight Bearing Restrictions: No      Mobility  Bed Mobility Overal bed mobility: Needs Assistance Bed Mobility: Supine to Sit     Supine to sit: Min guard;HOB elevated     General bed mobility comments: increased time, labored movement  Transfers Overall transfer level: Needs assistance Equipment used: Rolling walker (2 wheeled) Transfers: Sit to/from [facility] Sit to Stand: Mod assist Stand pivot transfers: Mod assist       General transfer comment: slow labored movement, limited weightbearing on LLE due to pain, limited ankle dorsiflexion  Ambulation/Gait Ambulation/Gait assistance: Mod assist;Max assist Gait Distance (Feet): 3 Feet Assistive device: Rolling walker (2 wheeled) Gait Pattern/deviations: Decreased step length - right;Decreased step length - left;Decreased stride length Gait velocity: decreased   General Gait Details: limited to 3-4 slow labored unsteady steps due to generalized weakness, limited weightbearing secondary to increased pain  Stairs            Wheelchair Mobility    Modified Rankin (Stroke Patients Only)       Balance Overall balance assessment: Needs assistance Sitting-balance support: Feet supported;No upper extremity supported Sitting balance-Leahy Scale: Fair Sitting balance - Comments: fair/good seated at EOB   Standing balance support: During functional activity;Bilateral upper extremity supported Standing balance-Leahy Scale: Poor Standing balance comment: using RW                             Pertinent Vitals/Pain Pain Assessment: 0-10 Pain Score: 8  Pain Location: abdomen Pain Descriptors / Indicators: Aching Pain Intervention(s): Limited activity within  patient's tolerance;Monitored during session    Home Living Family/patient expects to be discharged to:: Private residence Living Arrangements: Spouse/significant other Available Help at Discharge:  Family;Available PRN/intermittently Type of Home: House Home Access: Stairs to enter Entrance Stairs-Rails: None Entrance Stairs-Number of Steps: 2 Home Layout: One level Home Equipment: Walker - 2 wheels;Wheelchair - manual;Shower seat;Bedside commode;Cane - single point      Prior Function Level of Independence: Independent with assistive device(s)         Comments: Biochemist, clinical Dominance   Dominant Hand: Right    Extremity/Trunk Assessment   Upper Extremity Assessment Upper Extremity Assessment: Generalized weakness    Lower Extremity Assessment Lower Extremity Assessment: Generalized weakness    Cervical / Trunk Assessment Cervical / Trunk Assessment: Kyphotic  Communication   Communication: No difficulties  Cognition Arousal/Alertness: Awake/alert Behavior During Therapy: WFL for tasks assessed/performed Overall Cognitive Status: Within Functional Limits for tasks assessed                                        General Comments      Exercises     Assessment/Plan    PT Assessment Patient needs continued PT services  PT Problem List Decreased strength;Decreased activity tolerance;Decreased balance;Decreased mobility       PT Treatment Interventions Balance training;Gait training;DME instruction;Stair training;Functional mobility training;Therapeutic activities;Therapeutic exercise;Patient/family education    PT Goals (Current goals can be found in the Care Plan section)  Acute Rehab PT Goals Patient Stated Goal: return home after rehab PT Goal Formulation: With patient Time For Goal Achievement: 08/29/20 Potential to Achieve Goals: Good    Frequency Min 3X/week   Barriers to discharge        Co-evaluation               AM-PAC PT "6 Clicks" Mobility  Outcome Measure Help needed turning from your back to your side while in a flat bed without using bedrails?: None Help needed moving from lying on  your back to sitting on the side of a flat bed without using bedrails?: A Little Help needed moving to and from a bed to a chair (including a wheelchair)?: A Lot Help needed standing up from a chair using your arms (e.g., wheelchair or bedside chair)?: A Lot Help needed to walk in hospital room?: A Lot Help needed climbing 3-5 steps with a railing? : Total 6 Click Score: 14    End of Session   Activity Tolerance: Patient tolerated treatment well;Patient limited by fatigue Patient left: in chair;with call bell/phone within reach (left sitting on [facility]) Nurse Communication: Mobility status PT Visit Diagnosis: Unsteadiness on feet (R26.81);Other abnormalities of gait and mobility (R26.89);Muscle weakness (generalized) (M62.81)    Time: 3009-2330 PT Time Calculation (min) (ACUTE ONLY): 29 min   Charges:   PT Evaluation $PT Eval Moderate Complexity: 1 Mod PT Treatments $Therapeutic Activity: 23-37 mins        3:45 PM, 08/15/20 Lonell Grandchild, MPT Physical Therapist with [facility] [phone] office [phone] mobile phone

## 2020-08-16 ENCOUNTER — Inpatient Hospital Stay (HOSPITAL_COMMUNITY): Payer: Medicare HMO

## 2020-08-16 DIAGNOSIS — J189 Pneumonia, unspecified organism: Secondary | ICD-10-CM

## 2020-08-16 LAB — CBC WITH DIFFERENTIAL/PLATELET
Abs Immature Granulocytes: 0.08 10*3/uL — ABNORMAL HIGH (ref 0.00–0.07)
Basophils Absolute: 0 10*3/uL (ref 0.0–0.1)
Basophils Relative: 1 %
Eosinophils Absolute: 0.2 10*3/uL (ref 0.0–0.5)
Eosinophils Relative: 3 %
HCT: 26.9 % — ABNORMAL LOW (ref 36.0–46.0)
Hemoglobin: 8.2 g/dL — ABNORMAL LOW (ref 12.0–15.0)
Immature Granulocytes: 1 %
Lymphocytes Relative: 11 %
Lymphs Abs: 0.8 10*3/uL (ref 0.7–4.0)
MCH: 25.5 pg — ABNORMAL LOW (ref 26.0–34.0)
MCHC: 30.5 g/dL (ref 30.0–36.0)
MCV: 83.8 fL (ref 80.0–100.0)
Monocytes Absolute: 0.9 10*3/uL (ref 0.1–1.0)
Monocytes Relative: 13 %
Neutro Abs: 4.9 10*3/uL (ref 1.7–7.7)
Neutrophils Relative %: 71 %
Platelets: 49 10*3/uL — ABNORMAL LOW (ref 150–400)
RBC: 3.21 MIL/uL — ABNORMAL LOW (ref 3.87–5.11)
RDW: 19.1 % — ABNORMAL HIGH (ref 11.5–15.5)
WBC: 6.9 10*3/uL (ref 4.0–10.5)
nRBC: 0 % (ref 0.0–0.2)

## 2020-08-16 LAB — BASIC METABOLIC PANEL
Anion gap: 8 (ref 5–15)
BUN: 13 mg/dL (ref 8–23)
CO2: 22 mmol/L (ref 22–32)
Calcium: 8 mg/dL — ABNORMAL LOW (ref 8.9–10.3)
Chloride: 99 mmol/L (ref 98–111)
Creatinine, Ser: 0.57 mg/dL (ref 0.44–1.00)
GFR, Estimated: >60 mL/min (ref >60)
Glucose, Bld: 109 mg/dL — ABNORMAL HIGH (ref 70–99)
Potassium: 3 mmol/L — ABNORMAL LOW (ref 3.5–5.1)
Sodium: 129 mmol/L — ABNORMAL LOW (ref 135–145)

## 2020-08-16 LAB — URINE CULTURE: Culture: >=100000 — AB

## 2020-08-16 LAB — HEPATIC FUNCTION PANEL
ALT: 25 U/L (ref 0–44)
AST: 42 U/L — ABNORMAL HIGH (ref 15–41)
Albumin: 2.5 g/dL — ABNORMAL LOW (ref 3.5–5.0)
Alkaline Phosphatase: 104 U/L (ref 38–126)
Bilirubin, Direct: 1 mg/dL — ABNORMAL HIGH (ref 0.0–0.2)
Indirect Bilirubin: 1.8 mg/dL — ABNORMAL HIGH (ref 0.3–0.9)
Total Bilirubin: 2.8 mg/dL — ABNORMAL HIGH (ref 0.3–1.2)
Total Protein: 5.5 g/dL — ABNORMAL LOW (ref 6.5–8.1)

## 2020-08-16 LAB — MAGNESIUM: Magnesium: 1.9 mg/dL (ref 1.7–2.4)

## 2020-08-16 MED ORDER — IOHEXOL 300 MG/ML  SOLN
75.0000 mL | Freq: Once | INTRAMUSCULAR | Status: AC | PRN
  Administered 2020-08-16: 75 mL via INTRAVENOUS

## 2020-08-16 MED ORDER — PROCHLORPERAZINE MALEATE 5 MG PO TABS
5.0000 mg | ORAL_TABLET | Freq: Three times a day (TID) | ORAL | Status: DC
  Administered 2020-08-16 – 2020-08-21 (×16): 5 mg via ORAL
  Filled 2020-08-16 (×16): qty 1

## 2020-08-16 MED ORDER — OXYCODONE HCL 5 MG PO TABS
5.0000 mg | ORAL_TABLET | Freq: Four times a day (QID) | ORAL | Status: DC | PRN
  Administered 2020-08-17: 5 mg via ORAL
  Administered 2020-08-17: 10 mg via ORAL
  Administered 2020-08-17: 5 mg via ORAL
  Administered 2020-08-18 – 2020-08-20 (×4): 10 mg via ORAL
  Administered 2020-08-20 – 2020-08-21 (×3): 5 mg via ORAL
  Filled 2020-08-16 (×2): qty 2
  Filled 2020-08-16: qty 1
  Filled 2020-08-16 (×2): qty 2
  Filled 2020-08-16: qty 1
  Filled 2020-08-16: qty 2
  Filled 2020-08-16: qty 1
  Filled 2020-08-16 (×3): qty 2

## 2020-08-16 MED ORDER — MORPHINE SULFATE (PF) 2 MG/ML IV SOLN
1.0000 mg | INTRAVENOUS | Status: DC | PRN
  Administered 2020-08-16 – 2020-08-18 (×3): 1 mg via INTRAVENOUS
  Filled 2020-08-16 (×3): qty 1

## 2020-08-16 MED ORDER — ALBUMIN HUMAN 25 % IV SOLN
25.0000 g | Freq: Once | INTRAVENOUS | Status: AC
  Administered 2020-08-16: 25 g via INTRAVENOUS
  Filled 2020-08-16: qty 100

## 2020-08-16 MED ORDER — POTASSIUM CHLORIDE 20 MEQ PO PACK
40.0000 meq | PACK | Freq: Two times a day (BID) | ORAL | Status: AC
  Administered 2020-08-16: 40 meq via ORAL
  Filled 2020-08-16: qty 2

## 2020-08-16 MED ORDER — FUROSEMIDE 10 MG/ML IJ SOLN
40.0000 mg | Freq: Once | INTRAMUSCULAR | Status: AC
  Administered 2020-08-16: 40 mg via INTRAVENOUS
  Filled 2020-08-16: qty 4

## 2020-08-16 MED ORDER — SODIUM CHLORIDE 0.9 % IV SOLN
100.0000 mg | Freq: Two times a day (BID) | INTRAVENOUS | Status: DC
  Administered 2020-08-16 – 2020-08-19 (×6): 100 mg via INTRAVENOUS
  Filled 2020-08-16 (×9): qty 100

## 2020-08-16 NOTE — Progress Notes (Signed)
Subjective: Feels about the same today. Abdominal swelling no better but no worse. No LE swelling. Has persistent, ongoing nausea. No vomiting. Had a bowel movement last night, no hematochezia or melena. Knows person, place, and time.  Objective: Vital signs in last 24 hours: Temp:  [99 F (37.2 C)-100.2 F (37.9 C)] 99.1 F (37.3 C) (10/15 0453) Pulse Rate:  [77-87] 77 (10/15 0453) Resp:  [17-20] 17 (10/15 0453) BP: (110-127)/(69-80) 115/69 (10/15 0453) SpO2:  [94 %-95 %] 94 % (10/15 0453) Last BM Date: 08/16/20 General:   Alert and oriented, pleasant Head:  Normocephalic and atraumatic. Eyes:  No icterus, sclera clear. Conjuctiva pink.  Mouth:  Without lesions, mucosa pink and moist.  Neck:  Supple, without thyromegaly or masses.  Heart:  S1, S2 present, no murmurs noted.  Lungs: Clear to auscultation bilaterally, without wheezing, rales, or rhonchi.  Abdomen:  Bowel sounds present, soft. Noted distended but no tense ascites, mild to moderate generalized TTP. No HSM or hernias noted. No rebound or guarding. No masses appreciated  Msk:  Symmetrical without gross deformities. Pulses:  Normal bilateral DP pulses noted. Extremities:  Without clubbing or edema. Neurologic:  Alert and  oriented x4;  grossly normal neurologically. Skin:  Warm and dry, intact without significant lesions.  Cervical Nodes:  No significant cervical adenopathy. Psych:  Alert and cooperative. Normal mood and affect.  Intake/Output from previous day: 10/14 0701 - 10/15 0700 In: 240 [P.O.:240] Out: 1500 [Urine:1500] Intake/Output this shift: No intake/output data recorded.  Lab Results: Recent Labs    08/14/20 1504 08/15/20 0616 08/16/20 0613  WBC 5.0 5.4 6.9  HGB 7.8* 7.9* 8.2*  HCT 26.1* 26.8* 26.9*  PLT 38* 50* 49*   BMET Recent Labs    08/14/20 0621 08/15/20 0616 08/16/20 0613  NA 133* 132* 129*  K 3.6 3.5 3.0*  CL 104 103 99  CO2 22 22 22   GLUCOSE 99 83 109*  BUN 16 14 13    CREATININE 0.50 0.53 0.57  CALCIUM 8.3* 8.1* 8.0*   LFT Recent Labs    08/14/20 0621 08/15/20 0616 08/16/20 0613  PROT 5.3* 5.6* 5.5*  ALBUMIN 2.4* 2.4* 2.5*  AST 39 44* 42*  ALT 26 24 25   ALKPHOS 115 105 104  BILITOT 2.3* 2.0* 2.8*  BILIDIR  --  0.7* 1.0*  IBILI  --  1.3* 1.8*   PT/INR Recent Labs    08/13/20 1815 08/14/20 0621  LABPROT 15.7* 17.0*  INR 1.3* 1.4*   Hepatitis Panel No results for input(s): HEPBSAG, HCVAB, HEPAIGM, HEPBIGM in the last 72 hours.   Studies/Results: CT ABDOMEN PELVIS W CONTRAST  Result Date: 08/15/2020 CLINICAL DATA:  Abdominal distension, pain. Cirrhosis. Paracentesis performed on 08/14/2020 EXAM: CT ABDOMEN AND PELVIS WITH CONTRAST TECHNIQUE: Multidetector CT imaging of the abdomen and pelvis was performed using the standard protocol following bolus administration of intravenous contrast. CONTRAST:  39m OMNIPAQUE IOHEXOL 300 MG/ML  SOLN COMPARISON:  07/07/2020 FINDINGS: Lower chest: Patchy predominantly ground-glass opacity within the right lower lobe is new from prior. Small left pleural effusion with a small amount of air present within the non dependent portion of the collection (series 5, image 11). Heart size within normal limits. No pericardial effusion. Hepatobiliary: Cirrhotic hepatic morphology. No focal liver lesion is identified. There is recanalization of the umbilical vein. Portal vein is well opacified. Multiple gallstones present within the gallbladder. No biliary dilatation. Pancreas: Unremarkable. Spleen: Splenomegaly.  No focal splenic lesion. Adrenals/Urinary Tract: Unremarkable adrenal glands. 12  x 7 mm stone remains present within the right renal pelvis. Mild right-sided hydronephrosis. No left hydronephrosis. Kidneys enhance symmetrically. Urinary bladder is unremarkable. Stomach/Bowel: There is long segment mild circumferential wall thickening predominantly involving the sigmoid and descending colon. No definite pericolonic  inflammatory changes, although this evaluation is significantly limited given the presence of abdominopelvic ascites. Similar degree of wall thickening involving the cecum and ascending colon. No dilated loops of small bowel. Stomach within normal limits. Vascular/Lymphatic: Findings of portal hypertension. No acute vascular abnormality is identified. No abdominopelvic lymphadenopathy. Reproductive: Unremarkable uterus. Stable appearance of bilateral adnexal cysts, previously characterized as benign on MRI from January 2021. Other: Small to moderate volume ascites. No organized abdominopelvic collection or abscess. No pneumoperitoneum. Musculoskeletal: Posttraumatic and postsurgical changes to the left hip and left hemipelvis. Chronic thoracolumbar compression fractures as well as sacral fracture. No new or acute osseous findings. IMPRESSION: 1. New ground-glass opacity within the right lower lobe suspicious for pneumonia. 2. Small left pleural effusion now containing a small amount of air. Etiology is unclear in the absence of recent thoracentesis. Consider CT chest for further evaluation. 3. Interval development of long segment mild wall thickening within the sigmoid and descending colon. No definite pericolonic inflammatory changes, although this evaluation is significantly limited given the presence of abdominopelvic ascites. Findings may represent an infectious or inflammatory colitis versus changes related to portal colopathy. 4. Mild right-sided hydronephrosis. 12 x 7 mm stone remains present within the right renal pelvis. 5. Cirrhosis with evidence of portal hypertension including splenomegaly and small to moderate volume ascites. 6. Cholelithiasis. Electronically Signed   By: Davina Poke D.O.   On: 08/15/2020 17:44   US Paracentesis  Result Date: 08/14/2020 INDICATION: Ascites secondary to hepatic cirrhosis. Request for diagnostic and therapeutic paracentesis. EXAM: ULTRASOUND GUIDED PARACENTESIS  MEDICATIONS: 1% lidocaine 10 mL COMPLICATIONS: None immediate. PROCEDURE: Informed written consent was obtained from the patient after a discussion of the risks, benefits and alternatives to treatment. A timeout was performed prior to the initiation of the procedure. Initial ultrasound scanning demonstrates a moderate amount of ascites within the left lateral abdomen quadrant. The left lateral abdomen was prepped and draped in the usual sterile fashion. 1% lidocaine was used for local anesthesia. Following this, a 19 gauge, 7-cm, Yueh catheter was introduced. An ultrasound image was saved for documentation purposes. The paracentesis was performed. The catheter was removed and a dressing was applied. The patient tolerated the procedure well without immediate post procedural complication. FINDINGS: A total of approximately 2.2 L of clear yellow fluid was removed. Samples were sent to the laboratory as requested by the clinical team. IMPRESSION: Successful ultrasound-guided paracentesis yielding 2.2 liters of peritoneal fluid. Read by: Gareth Eagle, PA-C Electronically Signed   By: Marijo Conception M.D.   On: 08/14/2020 12:53   DG Abd Portable 1V  Result Date: 08/15/2020 CLINICAL DATA:  Abdominal pain and distension EXAM: PORTABLE ABDOMEN - 1 VIEW COMPARISON:  CT abdomen and pelvis July 07, 2020 FINDINGS: There is mild dilatation of loops of colon. There is mild wall thickening in the descending colon which may have inflammatory etiology. There is moderate stool in the colon. There is no appreciable air-fluid levels. There are gallstones in the right upper quadrant. There is a calcification in the region of the right renal pelvis measuring 1.2 x 0.8 cm. There is postoperative change in the proximal femur. No evident free air. Visualized lung bases are. IMPRESSION: Question a degree of colitis. No bowel obstruction appreciable.  No free air. Gallstones evident in right upper quadrant. Visualized lung bases clear.  Calcification in the region of the right renal pelvis measuring 1.2 x 0.8 cm. Obstruction at or near the right ureteropelvic junction from this apparent calculus cannot be excluded by radiography. Electronically Signed   By: Lowella Grip III M.D.   On: 08/15/2020 12:50    Assessment: Very pleasant 64 year old female who appears weak and frail.  She has a chronic history of [name] cirrhosis with noncompliance with lactulose.  Not a candidate for TIPS procedure given history of hepatic encephalopathy.  EGD 2 years ago with trace to small esophageal varices (does not appear to be on nonselective beta-blocker).  Overall decompensated cirrhosis due to [place].    She was admitted with worsening weakness and recent falls.  CT of the head negative.  Her diuretics had recently been changed to continue spironolactone 150 mg, hold Lasix 40 mg.  On presentation she was noted to have a distended abdomen.  She underwent abdominal paracentesis yesterday which removed 2.2 L.  Labs negative for SBP.  Albumin is low.  Initially at baseline hemoglobin but has since declined and now microscopic and hyperchromic.  No obvious bleeding per the patient.  Hepatic encephalopathy - Mental status currently at baseline.  She is on lactulose and Xifaxan.  However, she is only had one bowel movement today which she notes is hard despite lactulose.  Will discuss with nursing about increasing lactulose for 3 soft/loose bowel movement today.  Continue Xifaxan as well.  She remains oriented x3 today.  Ascites - She has recently been placed back on Lasix 40 mg in addition to Aldactone 150 mg. Still no lower extremity edema (today or since admission). Abdomen remains distended but not tight. CT with mild to moderate ascites. No mention of PVT on cross-sectional imaging. Serum albumin remains low, on Lasix 40 mg daily and Aldactone 150 mg daily. Will plan for IV albumin today followed by IV lasix 2 hours after infusion to see if we can  improve third-spacing. She may need additional doses of Albumin/Lasix this weekend pending result.  Decompensated cirrhosis - Other than complications as noted above, she appears to be near baseline. MELD 17 on admission, Child-Pugh Class C (On 05/19/20 her MELD was 18. Child-Pugh B/C). She may need NSBB for history of small varices, not a candidate for TIPS; will defer to primary outpatient GI.   Nausea - She has persistent ongoing nausea, no vomiting. Likely sequelae of fluid accumulation/ascites. Has prolonged QTc. Discussed with pharmacy and will plan scheduled compazine 5 mg q 8 hours, keep Zofran prn. Hopefully as her fluid status improves so will her nausea.   Plan: 1. IV Albumin today followed by IV lasix 61m (2 hours after) 2. Scheduled compazine, keep prn Zofran for nausea 3. Monitor for any GI bleed 4. Continue lactulose (titrate for 3-4 soft stools daily) 5. Continue Rifaxamin 550 mg bid for HE prophylaxis/management 6. Notify GI of any change in mental status 7. Further recommendations to follow.   Thank you for allowing [facility] participate in the care of KNat Math DNP, AGNP-C Adult & Gerontological Nurse Practitioner [facility]    LOS: 3 days    08/16/2020, 9:26 AM

## 2020-08-16 NOTE — NC FL2 (Signed)
[place] MEDICAID FL2 LEVEL OF CARE SCREENING TOOL     IDENTIFICATION  Patient Name: Megan Roberson Birthdate: 07-21-1956 Sex: female Admission Date (Current Location): 08/13/2020  [facility] and [place] Number:  [employer] and Address:  [facility] [address] [place]      Provider Number: [id number]  Attending Physician Name and Address:  Desiree Hane, MD  Relative Name and Phone Number:  Lakota Schweppe (husband) Ph: [phone]    Current Level of Care: Hospital Recommended Level of Care: [place] Prior Approval Number:    Date Approved/Denied: 08/16/20 PASRR Number: [id number]  Discharge Plan: SNF    Current Diagnoses: Patient Active Problem List   Diagnosis Date Noted  . Acute cystitis with hematuria   . Hypothyroidism 08/14/2020  . Displaced fracture of medial cuneiform of left foot, initial encounter for closed fracture 08/14/2020  . Failure to thrive in adult 08/14/2020  . Weight loss 08/14/2020  . Acute lower UTI 08/14/2020  . Fall at home, initial encounter 08/14/2020  . Generalized weakness 08/13/2020  . Pain in right knee 06/04/2020  . Pain in left hip 06/04/2020  . Nausea 05/19/2020  . Prolonged QT interval 05/19/2020  . Elevated brain natriuretic peptide (BNP) level 05/19/2020  . Hypoalbuminemia 05/19/2020  . Cough 05/19/2020  . Atelectasis 05/19/2020  . Hypomagnesemia 05/19/2020  . Cysts of both ovaries 06/15/2019  . Closed fracture of left tibial plateau 05/17/18 07/08/2018  . Esophageal varices without bleeding ([place])   . Osteoporosis 02/09/2018  . Colon cancer screening 01/18/2018  . Refractory ascites 11/25/2017  . Cellulitis, abdominal wall 11/25/2017  . SOB (shortness of breath) 11/25/2017  . Malnutrition of moderate degree ([place]) 11/06/2017  . Ogilvie's syndrome   . Abdominal distension   . Abdominal pain   . Liver failure without hepatic coma ([place])   . Thrombocytopenia  ([place])   . Acute hepatic encephalopathy   . Closed fracture of lumbar spine without lesion of spinal cord ([place])   . Decompensated hepatic cirrhosis ([place]) 04/21/2017  . Pancytopenia ([place]) 04/21/2017  . History of open reduction and internal fixation (ORIF) procedure 04/21/2017  . Visit for suture removal 04/21/2017  . Left Olecranon fracture 04/21/2017  . Hypokalemia 04/21/2017    Orientation RESPIRATION BLADDER Height & Weight     Self, Time, Situation, Place  Normal Continent Weight: 125 lb 10.6 oz (57 kg) Height:  5' 7"  (170.2 cm)  BEHAVIORAL SYMPTOMS/MOOD NEUROLOGICAL BOWEL NUTRITION STATUS      Continent Diet (Heart healthy)  AMBULATORY STATUS COMMUNICATION OF NEEDS Skin   Extensive Assist Verbally Other (Comment) (Ecchymosis (scattered))                       Personal Care Assistance Level of Assistance  Bathing, Feeding, Dressing Bathing Assistance: Limited assistance Feeding assistance: Independent Dressing Assistance: Maximum assistance     Functional Limitations Info  Sight, Hearing, Speech Sight Info: Adequate Hearing Info: Adequate Speech Info: Adequate    SPECIAL CARE FACTORS FREQUENCY  PT (By licensed PT)     PT Frequency: 5x per week              Contractures      Additional Factors Info  Code Status, Allergies, Psychotropic Code Status Info: Full Allergies Info: Penicillins; Tylenol (Acetaminophen); Sulfa Antibiotics; Tramadol Psychotropic Info: Zoloft (sertraline)         Current Medications (08/16/2020):  This is the current hospital active medication list Current  Facility-Administered Medications  Medication Dose Route Frequency Provider Last Rate Last Admin  . albumin human 25 % solution 25 g  25 g Intravenous Once Walden Field A, NP      . cefTRIAXone (ROCEPHIN) 1 g in sodium chloride 0.9 % 100 mL IVPB  1 g Intravenous Q24H Adefeso, Oladapo, DO 200 mL/hr at 08/15/20 2323 1 g at 08/15/20 2323  . diclofenac Sodium (VOLTAREN) 1 %  topical gel 2 g  2 g Topical Daily PRN Oretha Milch D, MD      . feeding supplement (ENSURE ENLIVE) (ENSURE ENLIVE) liquid 237 mL  237 mL Oral BID BM Adefeso, Oladapo, DO   237 mL at 08/15/20 1354  . furosemide (LASIX) injection 40 mg  40 mg Intravenous Once Walden Field A, NP      . furosemide (LASIX) tablet 40 mg  40 mg Oral Daily Oretha Milch D, MD   40 mg at 08/16/20 0856  . lactulose (CHRONULAC) 10 GM/15ML solution 20 g  20 g Oral TID Adefeso, Oladapo, DO   20 g at 08/16/20 0855  . levothyroxine (SYNTHROID) tablet 75 mcg  75 mcg Oral QAC breakfast Adefeso, Oladapo, DO   75 mcg at 08/16/20 0601  . ondansetron (ZOFRAN-ODT) disintegrating tablet 4 mg  4 mg Oral Q8H PRN Oretha Milch D, MD   4 mg at 08/16/20 0926  . oxyCODONE (Oxy IR/ROXICODONE) immediate release tablet 5 mg  5 mg Oral Q8H PRN Adefeso, Oladapo, DO   5 mg at 08/16/20 0757  . pantoprazole (PROTONIX) EC tablet 40 mg  40 mg Oral Daily Oretha Milch D, MD   40 mg at 08/16/20 0856  . potassium chloride (KLOR-CON) packet 40 mEq  40 mEq Oral BID Oretha Milch D, MD      . prochlorperazine (COMPAZINE) tablet 5 mg  5 mg Oral Q8H Gill, Eric A, NP      . rifaximin (XIFAXAN) tablet 550 mg  550 mg Oral BID Oretha Milch D, MD   550 mg at 08/16/20 0855  . sertraline (ZOLOFT) tablet 50 mg  50 mg Oral Daily Oretha Milch D, MD   50 mg at 08/16/20 0856  . spironolactone (ALDACTONE) tablet 150 mg  150 mg Oral Daily Adefeso, Oladapo, DO   150 mg at 08/16/20 0855  . traZODone (DESYREL) tablet 50 mg  50 mg Oral QHS PRN Desiree Hane, MD         Discharge Medications: Please see discharge summary for a list of discharge medications.  Relevant Imaging Results:  Relevant Lab Results:   Additional Information SSN: [id number]  Natasha Bence, LCSW

## 2020-08-16 NOTE — Care Management Important Message (Signed)
Important Message  Patient Details  Name: Megan Roberson MRN: [id number] Date of Birth: [birth year]   Medicare Important Message Given:  Yes     Tommy Medal 08/16/2020, 4:23 PM

## 2020-08-16 NOTE — Progress Notes (Addendum)
[facility]  PROGRESS NOTE  Megan Roberson KVQ:[id number] DOB: 24-Apr-1956 DOA: 08/13/2020 PCP: Celene Squibb, MD Admit date - 08/13/2020   Admitting Physician Bernadette Hoit, DO  Outpatient Primary MD for the patient is Celene Squibb, MD  LOS - 3 Brief Narrative   Megan Roberson is a 64 y.o. year old female with medical history significant for Pancytopenia, cirrhosis of liver, hypothyroidism, GERD who presents on 08/13/2020 with generalized weakness, nausea, recurrent falls and left foot pain and was found to have profound ascites, hepatic encephalopathy, and acute fracture of medial cuneiform of the left foot.  Patient was started on ceftriaxone due to concern for UTI given positive UA but patient denies any urinary symptoms.  Patient underwent IR guided paracentesis on 10/13 with removal of 2 L of ascitic fluid.  Of note, underwent outpatient IR paracentesis with 3.3 L removed on 10/7  Hospital course complicated by refractory, quick accumulating ascites presume related to poor adherence to diuretic therapy.  GI was consulted on 10/14 to further assist.  CT abdomen now shows concern for potential right-sided pneumonia as well as questionable inflammatory component of: Induration to left pleural effusion with possible air bubble   Subjective  She continues to report abdominal tenderness mainly on the right side as a band that wraps around to her back, only has mild cough, no shortness of breath, some nausea without emesis.  Decreased appetite.  A & P   Decompensated cirrhosis with hepatic encephalopathy and ascites in the setting of nonadherence with lactulose therapy due to nausea/poor taste as well as diuretics.  Mental status is appropriate, still has significant ascites on exam that is worse despite recent 2.2 L being removed by IR paracentesis on 10/13.  CT abdomen shows concern for potential colitis but no signs of obstruction.  Suspect recurrent ascites in setting of poor adherence  to diuretic therapy.  Denies any hematemesis or melena.   -continue lactulose, rifaximin -Continue IV ceftriaxone in case of SBP, follow-up ascitic fluid analysis -Continue to monitor mental status -Continue home spironolactone, resume Lasix at 40 mg (decreased from home regimen of 80 mg) given recurrent ascites 24 hours after paracentesis -GI consulted given quick accumulation of ascites, considering Right upper quadrant ultrasound with Doppler for PVT evaluation, she may be a candidate for IV albumin/Lasix per GI  Abdominal pain likely multifactorial including recurrent ascites as well as concern for potential colitis. CT abdomen mild wall thickening of the colon that could represent infectious or inflammatory colitis versus changes related to portal colopathy.  Aside from tenderness on the right side of her abdomen she has no diarrhea, no fevers, no leukocytosis -Continue to address ascites mentioned above, and supportive care -Do not think this warrants antibiotic therapy currently  New groundglass opacity concerning for possible pneumonia and small left pleural effusion containing a small amount of air per CT abdomen of unclear clinical relevance given patient only has slight cough with no O2 requirement and not much mention of shortness of breath -We will further assess with CT chest Addendum CT chest confirms right lower lobe pneumonia.  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  Iron panel seems more consistent with iron deficiency.  Not having any active signs of bleeding -Closely monitor CBC, transfuse  hemoglobin less than 7, platelets less than 10,000  Generalized weakness with recurrent falls, high concern for failure to thrive Suspect general failure to thrive in setting of chronic liver disease, B12 within normal limits, TSH slightly elevated at 6.5, thiamine deficiency also possibility, electrolytes all within normal limits -Check B1 -Continue home Synthroid -PT eval given left foot fracture -Dietary consulted, protein supplementation  Moderate malnutrition, related to chronic illness.  As evident by moderate fat/muscle depletion -Appreciate nutrition recommendations -We will continue Ensure p.o. twice daily as well as supplementation  Abnormal UA.  Denies any urinary symptoms to me, not likely UTI -Continue to monitor urine culture, IV ceftriaxone for now  GERD, stable -Continue oral PPI  Mood disorder, stable -Continue home Zoloft  Left minimally displaced medial cuneiform foot fracture secondary to fall Cast placed in the ED, outpatient orthopedic follow-up -Oxycodone 5 mg every 8 hours for moderate/severe pain   Family Communication  : None at bedside, will update husband per patient request  Code Status : Full  Disposition Plan  :  Patient is from home. Anticipated d/c date:  2 to 3 days. Barriers to d/c or necessity for inpatient status:  Currently requiring close monitoring of ascites given quickly reaccumulation despite recent paracentesis, needs better optimization of diuretic therapy, also getting CT chest for assessment for possible right-sided pneumonia as well as left pleural effusion with small amount of air based on CT abdomen, GI also considering right upper quadrant ultrasound with PVT evaluation Consults  : None  Procedures  : IR guided paracentesis, 10/13  DVT Prophylaxis  : SCDs MDM: The below labs and imaging reports were reviewed and summarized above.  Medication management as above.  Lab Results  Component Value Date   PLT 49 (L) 08/16/2020     Diet :  Diet Order            Diet 2 gram sodium Room service appropriate? Yes; Fluid consistency: Thin  Diet effective now                  Inpatient Medications Scheduled Meds: . feeding supplement  237 mL Oral BID BM  . furosemide  40 mg Oral Daily  . lactulose  20 g Oral TID  . levothyroxine  75 mcg Oral QAC breakfast  . pantoprazole  40 mg Oral Daily  . potassium chloride  40 mEq Oral BID  . rifaximin  550 mg Oral BID  . sertraline  50 mg Oral Daily  . spironolactone  150 mg Oral Daily   Continuous Infusions: . cefTRIAXone (ROCEPHIN)  IV 1 g (08/15/20 2323)   PRN Meds:.diclofenac Sodium, ondansetron, oxyCODONE, prochlorperazine, traZODone  Antibiotics  :   Anti-infectives (From admission, onward)   Start     Dose/Rate Route Frequency Ordered Stop   08/14/20 2359  cefTRIAXone (ROCEPHIN) 1 g in sodium chloride 0.9 % 100 mL IVPB        1 g 200 mL/hr over 30 Minutes Intravenous Every 24 hours 08/14/20 0120     08/14/20 1000  rifaximin (XIFAXAN) tablet 550 mg        550 mg Oral 2 times daily 08/14/20 0834     08/13/20 2300  cefTRIAXone (ROCEPHIN) 1 g in sodium chloride 0.9 % 100 mL IVPB        1 g 200 mL/hr over 30 Minutes Intravenous  Once 08/13/20 2257 08/14/20 0026  Objective   Vitals:   08/15/20 1406 08/15/20 1755 08/15/20 2100 08/16/20 0453  BP: 127/80  115/70 115/69  Pulse: 85  87 77  Resp:   20 17  Temp: 99 F (37.2 C) 99.2 F (37.3 C) 100.2 F (37.9 C) 99.1 F (37.3 C)  TempSrc: Oral  Oral Oral  SpO2: 95%  94% 94%  Weight:      Height:        SpO2: 94 % O2 Flow Rate (L/min): 0 L/min FiO2 (%): 21 %  Wt Readings from Last 3 Encounters:  08/14/20 57 kg  08/06/20 54.9 kg  06/04/20 63.5 kg     Intake/Output Summary (Last 24 hours) at 08/16/2020 1231 Last data filed at 08/15/2020 1800 Gross per 24 hour  Intake 240 ml  Output 1500 ml  Net -1260 ml    Physical Exam:     Awake Alert, Oriented to self, place, context Thin,  frail No new F.N deficits,  [place].AT, Normal respiratory effort on room air, CTAB RRR,No Gallops,Rubs or new Murmurs, Decreased abdominal sounds, remains distended but soft with no rebound tenderness though more tender on palpation in right lower quadrant , no guarding Bruising along left toes   I have personally reviewed the following:   Data Reviewed:  CBC Recent Labs  Lab 08/13/20 1815 08/14/20 0621 08/14/20 1504 08/15/20 0616 08/16/20 0613  WBC 8.1 5.4 5.0 5.4 6.9  HGB 9.2* 7.5* 7.8* 7.9* 8.2*  HCT 29.8* 25.4* 26.1* 26.8* 26.9*  PLT 45* 42* 38* 50* 49*  MCV 85.1 85.2 84.7 84.8 83.8  MCH 26.3 25.2* 25.3* 25.0* 25.5*  MCHC 30.9 29.5* 29.9* 29.5* 30.5  RDW 19.4* 19.3* 19.2* 19.4* 19.1*  LYMPHSABS 0.7  --   --  0.5* 0.8  MONOABS 0.9  --   --  0.8 0.9  EOSABS 0.2  --   --  0.2 0.2  BASOSABS 0.0  --   --  0.1 0.0    Chemistries  Recent Labs  Lab 08/13/20 1815 08/14/20 0621 08/15/20 0616 08/16/20 0613  NA 132* 133* 132* 129*  K 4.3 3.6 3.5 3.0*  CL 103 104 103 99  CO2 20* 22 22 22   GLUCOSE 150* 99 83 109*  BUN 18 16 14 13   CREATININE 0.60 0.50 0.53 0.57  CALCIUM 8.8* 8.3* 8.1* 8.0*  MG  --  1.9  --  1.9  AST 48* 39 44* 42*  ALT 29 26 24 25   ALKPHOS 156* 115 105 104  BILITOT 2.9* 2.3* 2.0* 2.8*   ------------------------------------------------------------------------------------------------------------------ No results for input(s): CHOL, HDL, LDLCALC, TRIG, CHOLHDL, LDLDIRECT in the last 72 hours.  No results found for: HGBA1C ------------------------------------------------------------------------------------------------------------------ Recent Labs    08/13/20 1815  TSH 6.506*   ------------------------------------------------------------------------------------------------------------------ Recent Labs    08/13/20 1815  VITAMINB12 604  FOLATE 16.9  FERRITIN 36  TIBC 340  IRON 28    Coagulation profile Recent Labs  Lab 08/13/20 1815  08/14/20 0621  INR 1.3* 1.4*    No results for input(s): DDIMER in the last 72 hours.  Cardiac Enzymes No results for input(s): CKMB, TROPONINI, MYOGLOBIN in the last 168 hours.  Invalid input(s): CK ------------------------------------------------------------------------------------------------------------------    Component Value Date/Time   BNP 114.0 (H) 05/18/2020 2209    Micro Results Recent Results (from the past 240 hour(s))  Gram stain     Status: None   Collection Time: 08/08/20  2:24 PM   Specimen: Abdomen; Peritoneal Fluid  Result Value Ref Range Status  Specimen Description PERITONEAL ABDOMEN  Final   Special Requests NONE  Final   Gram Stain   Final    WBC PRESENT,BOTH PMN AND MONONUCLEAR NO ORGANISMS SEEN CYTOSPIN SMEAR Performed at [facility], [address] [place], [place] [address]    Report Status 08/08/2020 FINAL  Final  Culture, body fluid-bottle     Status: None   Collection Time: 08/08/20  2:24 PM   Specimen: Fluid  Result Value Ref Range Status   Specimen Description FLUID ABDOMEN PERITONEAL  Final   Special Requests BOTTLES DRAWN AEROBIC AND ANAEROBIC  Final   Culture   Final    NO GROWTH 5 DAYS Performed at [facility], [place] [address], [place], [place] [address]    Report Status 08/13/2020 FINAL  Final  Resp Panel by RT PCR (RSV, Flu A&B, Covid) - Nasopharyngeal Swab     Status: None   Collection Time: 08/13/20  9:42 PM   Specimen: Nasopharyngeal Swab  Result Value Ref Range Status   SARS Coronavirus 2 by RT PCR NEGATIVE NEGATIVE Final    Comment: (NOTE) SARS-CoV-2 target nucleic acids are NOT DETECTED.  The SARS-CoV-2 RNA is generally detectable in upper respiratoy specimens during the acute phase of infection. The lowest concentration of SARS-CoV-2 viral copies this assay can detect is 131 copies/mL. A negative result does not preclude SARS-Cov-2 infection and should not be used as the sole basis for treatment  or other patient management decisions. A negative result may occur with  improper specimen collection/handling, submission of specimen other than nasopharyngeal swab, presence of viral mutation(s) within the areas targeted by this assay, and inadequate number of viral copies (<131 copies/mL). A negative result must be combined with clinical observations, patient history, and epidemiological information. The expected result is Negative.  Fact Sheet for Patients:  PinkCheek.be  Fact Sheet for Healthcare Providers:  GravelBags.it  This test is no t yet approved or cleared by the [place] FDA and  has been authorized for detection and/or diagnosis of SARS-CoV-2 by FDA under an Emergency Use Authorization (EUA). This EUA will remain  in effect (meaning this test can be used) for the duration of the COVID-19 declaration under Section 564(b)(1) of the Act, 21 U.S.C. section 360bbb-3(b)(1), unless the authorization is terminated or revoked sooner.     Influenza A by PCR NEGATIVE NEGATIVE Final   Influenza B by PCR NEGATIVE NEGATIVE Final    Comment: (NOTE) The Xpert Xpress SARS-CoV-2/FLU/RSV assay is intended as an aid in  the diagnosis of influenza from Nasopharyngeal swab specimens and  should not be used as a sole basis for treatment. Nasal washings and  aspirates are unacceptable for Xpert Xpress SARS-CoV-2/FLU/RSV  testing.  Fact Sheet for Patients: PinkCheek.be  Fact Sheet for Healthcare Providers: GravelBags.it  This test is not yet approved or cleared by the [place] FDA and  has been authorized for detection and/or diagnosis of SARS-CoV-2 by  FDA under an Emergency Use Authorization (EUA). This EUA will remain  in effect (meaning this test can be used) for the duration of the  Covid-19 declaration under Section 564(b)(1) of the Act, 21  U.S.C.  section 360bbb-3(b)(1), unless the authorization is  terminated or revoked.    Respiratory Syncytial Virus by PCR NEGATIVE NEGATIVE Final    Comment: (NOTE) Fact Sheet for Patients: PinkCheek.be  Fact Sheet for Healthcare Providers: GravelBags.it  This test is not yet approved or cleared by the [place] FDA and  has  been authorized for detection and/or diagnosis of SARS-CoV-2 by  FDA under an Emergency Use Authorization (EUA). This EUA will remain  in effect (meaning this test can be used) for the duration of the  COVID-19 declaration under Section 564(b)(1) of the Act, 21 U.S.C.  section 360bbb-3(b)(1), unless the authorization is terminated or  revoked. Performed at [facility], [address], [place], [place] [address]   Urine culture     Status: Abnormal   Collection Time: 08/13/20 10:52 PM   Specimen: Urine, Random  Result Value Ref Range Status   Specimen Description   Final    URINE, RANDOM Performed at [facility], [address], [place], [place] [address]    Special Requests   Final    NONE Performed at [facility], [address] [place], [place] [address]    Culture >=100,000 COLONIES/mL CITROBACTER FREUNDII (A)  Final   Report Status 08/16/2020 FINAL  Final   Organism ID, Bacteria CITROBACTER FREUNDII (A)  Final      Susceptibility   Citrobacter freundii - MIC*    CEFAZOLIN RESISTANT Resistant     CEFTRIAXONE <=0.25 SENSITIVE Sensitive     CIPROFLOXACIN <=0.25 SENSITIVE Sensitive     GENTAMICIN <=1 SENSITIVE Sensitive     IMIPENEM <=0.25 SENSITIVE Sensitive     NITROFURANTOIN <=16 SENSITIVE Sensitive     TRIMETH/SULFA <=20 SENSITIVE Sensitive     PIP/TAZO <=4 SENSITIVE Sensitive     * >=100,000 COLONIES/mL CITROBACTER FREUNDII  Body fluid culture     Status: None (Preliminary result)   Collection Time: 08/14/20 11:36 AM   Specimen: Abdomen; Peritoneal Fluid  Result Value Ref Range Status    Specimen Description   Final    ABDOMEN Performed at [facility], [address], [place], [place] [address]    Special Requests   Final    NONE Performed at [facility], [address] [place], [place] [address]    Gram Stain NO WBC SEEN NO ORGANISMS SEEN   Final   Culture   Final    NO GROWTH 2 DAYS Performed at [facility], [place] [address], [place], [place] [address]    Report Status PENDING  Incomplete    Radiology Reports CT Head Wo Contrast  Result Date: 08/13/2020 CLINICAL DATA:  Fall, head injury EXAM: CT HEAD WITHOUT CONTRAST TECHNIQUE: Contiguous axial images were obtained from the base of the skull through the vertex without intravenous contrast. COMPARISON:  None. FINDINGS: Brain: Normal anatomic configuration. Parenchymal volume loss is commensurate with the patient's age. No abnormal intra or extra-axial mass lesion or fluid collection. No abnormal mass effect or midline shift. No evidence of acute intracranial hemorrhage or infarct. Ventricular size is normal. Cerebellum unremarkable. Vascular: No asymmetric hyperdense vasculature at the skull base. Skull: Intact Sinuses/Orbits: Paranasal sinuses are clear. Orbits are unremarkable. Other: Mastoid air cells and middle ear cavities are clear. IMPRESSION: No acute intracranial injury.  No calvarial fracture. Electronically Signed   By: Fidela Salisbury MD   On: 08/13/2020 22:06   CT ABDOMEN PELVIS W CONTRAST  Result Date: 08/15/2020 CLINICAL DATA:  Abdominal distension, pain. Cirrhosis. Paracentesis performed on 08/14/2020 EXAM: CT ABDOMEN AND PELVIS WITH CONTRAST TECHNIQUE: Multidetector CT imaging of the abdomen and pelvis was performed using the standard protocol following bolus administration of intravenous contrast. CONTRAST:  67m OMNIPAQUE IOHEXOL 300 MG/ML  SOLN COMPARISON:  07/07/2020 FINDINGS: Lower chest: Patchy predominantly ground-glass opacity within the right lower lobe is new from prior. Small  left pleural  effusion with a small amount of air present within the non dependent portion of the collection (series 5, image 11). Heart size within normal limits. No pericardial effusion. Hepatobiliary: Cirrhotic hepatic morphology. No focal liver lesion is identified. There is recanalization of the umbilical vein. Portal vein is well opacified. Multiple gallstones present within the gallbladder. No biliary dilatation. Pancreas: Unremarkable. Spleen: Splenomegaly.  No focal splenic lesion. Adrenals/Urinary Tract: Unremarkable adrenal glands. 12 x 7 mm stone remains present within the right renal pelvis. Mild right-sided hydronephrosis. No left hydronephrosis. Kidneys enhance symmetrically. Urinary bladder is unremarkable. Stomach/Bowel: There is long segment mild circumferential wall thickening predominantly involving the sigmoid and descending colon. No definite pericolonic inflammatory changes, although this evaluation is significantly limited given the presence of abdominopelvic ascites. Similar degree of wall thickening involving the cecum and ascending colon. No dilated loops of small bowel. Stomach within normal limits. Vascular/Lymphatic: Findings of portal hypertension. No acute vascular abnormality is identified. No abdominopelvic lymphadenopathy. Reproductive: Unremarkable uterus. Stable appearance of bilateral adnexal cysts, previously characterized as benign on MRI from January 2021. Other: Small to moderate volume ascites. No organized abdominopelvic collection or abscess. No pneumoperitoneum. Musculoskeletal: Posttraumatic and postsurgical changes to the left hip and left hemipelvis. Chronic thoracolumbar compression fractures as well as sacral fracture. No new or acute osseous findings. IMPRESSION: 1. New ground-glass opacity within the right lower lobe suspicious for pneumonia. 2. Small left pleural effusion now containing a small amount of air. Etiology is unclear in the absence of recent thoracentesis. Consider  CT chest for further evaluation. 3. Interval development of long segment mild wall thickening within the sigmoid and descending colon. No definite pericolonic inflammatory changes, although this evaluation is significantly limited given the presence of abdominopelvic ascites. Findings may represent an infectious or inflammatory colitis versus changes related to portal colopathy. 4. Mild right-sided hydronephrosis. 12 x 7 mm stone remains present within the right renal pelvis. 5. Cirrhosis with evidence of portal hypertension including splenomegaly and small to moderate volume ascites. 6. Cholelithiasis. Electronically Signed   By: Davina Poke D.O.   On: 08/15/2020 17:44   US Paracentesis  Result Date: 08/14/2020 INDICATION: Ascites secondary to hepatic cirrhosis. Request for diagnostic and therapeutic paracentesis. EXAM: ULTRASOUND GUIDED PARACENTESIS MEDICATIONS: 1% lidocaine 10 mL COMPLICATIONS: None immediate. PROCEDURE: Informed written consent was obtained from the patient after a discussion of the risks, benefits and alternatives to treatment. A timeout was performed prior to the initiation of the procedure. Initial ultrasound scanning demonstrates a moderate amount of ascites within the left lateral abdomen quadrant. The left lateral abdomen was prepped and draped in the usual sterile fashion. 1% lidocaine was used for local anesthesia. Following this, a 19 gauge, 7-cm, Yueh catheter was introduced. An ultrasound image was saved for documentation purposes. The paracentesis was performed. The catheter was removed and a dressing was applied. The patient tolerated the procedure well without immediate post procedural complication. FINDINGS: A total of approximately 2.2 L of clear yellow fluid was removed. Samples were sent to the laboratory as requested by the clinical team. IMPRESSION: Successful ultrasound-guided paracentesis yielding 2.2 liters of peritoneal fluid. Read by: Gareth Eagle, PA-C  Electronically Signed   By: Marijo Conception M.D.   On: 08/14/2020 12:53   DG Abd Portable 1V  Result Date: 08/15/2020 CLINICAL DATA:  Abdominal pain and distension EXAM: PORTABLE ABDOMEN - 1 VIEW COMPARISON:  CT abdomen and pelvis July 07, 2020 FINDINGS: There is mild dilatation of loops of colon. There  is mild wall thickening in the descending colon which may have inflammatory etiology. There is moderate stool in the colon. There is no appreciable air-fluid levels. There are gallstones in the right upper quadrant. There is a calcification in the region of the right renal pelvis measuring 1.2 x 0.8 cm. There is postoperative change in the proximal femur. No evident free air. Visualized lung bases are. IMPRESSION: Question a degree of colitis. No bowel obstruction appreciable. No free air. Gallstones evident in right upper quadrant. Visualized lung bases clear. Calcification in the region of the right renal pelvis measuring 1.2 x 0.8 cm. Obstruction at or near the right ureteropelvic junction from this apparent calculus cannot be excluded by radiography. Electronically Signed   By: Lowella Grip III M.D.   On: 08/15/2020 12:50   DG Foot Complete Left  Result Date: 08/13/2020 CLINICAL DATA:  Fall, left foot bruising EXAM: LEFT FOOT - COMPLETE 3+ VIEW COMPARISON:  None. 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 There is an acute intra-articular fracture of the probable medial navicular noted dorsally with fracture fragments in near anatomic alignment. Soft tissues are unremarkable. Joint spaces appear preserved. IMPRESSION: Tiny minimally displaced fracture of the probable medial cuneiform dorsally with fracture fragments in anatomic alignment. Electronically Signed   By: Fidela Salisbury MD   On: 08/13/2020 22:31   IR Paracentesis  Result Date: 08/08/2020 INDICATION: Patient with history of NASH cirrhosis, new  onset ascites. Request to IR for diagnostic and therapeutic paracentesis. EXAM: ULTRASOUND GUIDED DIAGNOSTIC AND THERAPEUTIC PARACENTESIS MEDICATIONS: 10 mL 1% lidocaine COMPLICATIONS: None immediate. PROCEDURE: Informed written consent was obtained from the patient after a discussion of the risks, benefits and alternatives to treatment. A timeout was performed prior to the initiation of the procedure. Initial ultrasound scanning demonstrates a large amount of ascites within the right lower abdominal quadrant. The right lower abdomen was prepped and draped in the usual sterile fashion. 1% lidocaine was used for local anesthesia. Following this, a 19 gauge, 7-cm, Yueh catheter was introduced. An ultrasound image was saved for documentation purposes. The paracentesis was performed. The catheter was removed and a dressing was applied. The patient tolerated the procedure well without immediate post procedural complication. FINDINGS: A total of approximately 3.3 L of clear, light yellow fluid was removed. Samples were sent to the laboratory as requested by the clinical team. IMPRESSION: Successful ultrasound-guided paracentesis yielding 3.3 liters of peritoneal fluid. Read by Candiss Norse, PA-C Electronically Signed   By: Corrie Mckusick D.O.   On: 08/08/2020 15:24     Time Spent in minutes  30     Desiree Hane M.D on 08/16/2020 at 12:31 PM  To page go to [email or web address] - password [facility]

## 2020-08-17 DIAGNOSIS — J189 Pneumonia, unspecified organism: Secondary | ICD-10-CM

## 2020-08-17 DIAGNOSIS — F39 Unspecified mood [affective] disorder: Secondary | ICD-10-CM

## 2020-08-17 LAB — MAGNESIUM: Magnesium: 1.9 mg/dL (ref 1.7–2.4)

## 2020-08-17 LAB — CBC WITH DIFFERENTIAL/PLATELET
Abs Immature Granulocytes: 0.12 10*3/uL — ABNORMAL HIGH (ref 0.00–0.07)
Basophils Absolute: 0 10*3/uL (ref 0.0–0.1)
Basophils Relative: 1 %
Eosinophils Absolute: 0.2 10*3/uL (ref 0.0–0.5)
Eosinophils Relative: 3 %
HCT: 28.1 % — ABNORMAL LOW (ref 36.0–46.0)
Hemoglobin: 8.4 g/dL — ABNORMAL LOW (ref 12.0–15.0)
Immature Granulocytes: 2 %
Lymphocytes Relative: 7 %
Lymphs Abs: 0.6 10*3/uL — ABNORMAL LOW (ref 0.7–4.0)
MCH: 25.6 pg — ABNORMAL LOW (ref 26.0–34.0)
MCHC: 29.9 g/dL — ABNORMAL LOW (ref 30.0–36.0)
MCV: 85.7 fL (ref 80.0–100.0)
Monocytes Absolute: 1.2 10*3/uL — ABNORMAL HIGH (ref 0.1–1.0)
Monocytes Relative: 15 %
Neutro Abs: 5.6 10*3/uL (ref 1.7–7.7)
Neutrophils Relative %: 72 %
Platelets: 51 10*3/uL — ABNORMAL LOW (ref 150–400)
RBC: 3.28 MIL/uL — ABNORMAL LOW (ref 3.87–5.11)
RDW: 19.1 % — ABNORMAL HIGH (ref 11.5–15.5)
WBC: 7.7 10*3/uL (ref 4.0–10.5)
nRBC: 0 % (ref 0.0–0.2)

## 2020-08-17 LAB — COMPREHENSIVE METABOLIC PANEL
ALT: 23 U/L (ref 0–44)
AST: 34 U/L (ref 15–41)
Albumin: 2.9 g/dL — ABNORMAL LOW (ref 3.5–5.0)
Alkaline Phosphatase: 98 U/L (ref 38–126)
Anion gap: 11 (ref 5–15)
BUN: 13 mg/dL (ref 8–23)
CO2: 23 mmol/L (ref 22–32)
Calcium: 8.2 mg/dL — ABNORMAL LOW (ref 8.9–10.3)
Chloride: 97 mmol/L — ABNORMAL LOW (ref 98–111)
Creatinine, Ser: 0.58 mg/dL (ref 0.44–1.00)
GFR, Estimated: >60 mL/min (ref >60)
Glucose, Bld: 113 mg/dL — ABNORMAL HIGH (ref 70–99)
Potassium: 2.7 mmol/L — CL (ref 3.5–5.1)
Sodium: 131 mmol/L — ABNORMAL LOW (ref 135–145)
Total Bilirubin: 3 mg/dL — ABNORMAL HIGH (ref 0.3–1.2)
Total Protein: 6 g/dL — ABNORMAL LOW (ref 6.5–8.1)

## 2020-08-17 MED ORDER — POTASSIUM CHLORIDE CRYS ER 20 MEQ PO TBCR
40.0000 meq | EXTENDED_RELEASE_TABLET | ORAL | Status: AC
  Administered 2020-08-17 (×3): 40 meq via ORAL
  Filled 2020-08-17 (×4): qty 2

## 2020-08-17 NOTE — Assessment & Plan Note (Signed)
-  Continue Ensure supplements and current diet order

## 2020-08-17 NOTE — Assessment & Plan Note (Addendum)
-   Suspect general failure to thrive in setting of chronic liver disease, B12 within normal limits, TSH slightly elevated at 6.5, thiamine deficiency also possibility, electrolytes all within normal limits -Check B1 -Continue home Synthroid; TSH 6.506; FT4 0.85. No dose adjustment; repeat TSH in 4-6 weeks outpatient -PT eval given left foot fracture; possible placement; SW has sent FL2 -Dietary consulted, protein supplementation - stable for discharge when bed available

## 2020-08-17 NOTE — Assessment & Plan Note (Signed)
-  Trend CBC -Transfuse for hemoglobin less than 7 or platelets less than 10,000

## 2020-08-17 NOTE — Assessment & Plan Note (Addendum)
-  Noted on CT -No hypoxia, mostly cough -Continue Rocephin and doxycycline - check PCT in am; if negative would stop abx at this point

## 2020-08-17 NOTE — Progress Notes (Signed)
PROGRESS NOTE    Megan Roberson   MWU:[id number]  DOB: 09-Feb-1956  DOA: 08/13/2020     4  PCP: Celene Squibb, MD  CC: weakness, foot pain  Hospital Course: Megan Roberson is a 64 y.o. year old female with medical history significant for Pancytopenia, cirrhosis of liver, hypothyroidism, GERD who presented on 08/13/2020 with generalized weakness, nausea, recurrent falls and left foot pain and was found to have profound ascites, hepatic encephalopathy, and acute fracture of medial cuneiform of the left foot.   Patient was started on ceftriaxone due to concern for UTI given positive UA but patient denied any urinary symptoms.  Patient underwent IR guided paracentesis on 10/13 with removal of 2 L of ascitic fluid.  Of note, underwent outpatient IR paracentesis with 3.3 L removed on 10/7. Ascites fluid was negative for SBP.  Culture also remained negative.   Hospital course complicated by refractory, quick accumulating ascites presumed related to poor adherence to diuretic therapy.  GI was consulted on 10/14 to further assist.  CT abdomen showed concern for potential right-sided pneumonia.  She was continued on Rocephin with doxycycline added.   Interval History:  No events overnight.  Resting comfortably in bed this morning.  Bedpan next to her in bed after having just had a liquid bowel movement. Overall feeling okay.  Is amenable for going to rehab at discharge to help regain strength and mobility.  Old records reviewed in assessment of this patient  ROS: Constitutional: negative for chills and fevers, Respiratory: negative for cough, Cardiovascular: negative for chest pain and Gastrointestinal: negative for vomiting  Assessment & Plan: * Generalized weakness - Suspect general failure to thrive in setting of chronic liver disease, B12 within normal limits, TSH slightly elevated at 6.5, thiamine deficiency also possibility, electrolytes all within normal limits -Check B1 -Continue home  Synthroid; TSH 6.506 (check FT4 and TT3) -PT eval given left foot fracture; possible placement; SW has sent FL2 -Dietary consulted, protein supplementation  Decompensated hepatic cirrhosis ([place]) -Noncompliance with lactulose and diuretic therapy at home -Underwent paracentesis on 08/14/2020, negative for SBP -Continue lactulose and rifaximin -Continue spironolactone and Lasix -Given 1 dose of albumin on 08/16/2020  Abdominal pain CT abdomen mild wall thickening of the colon that could represent infectious or inflammatory colitis versus changes related to portal colopathy.  Aside from tenderness on the right side of her abdomen she has no diarrhea, no fevers, no leukocytosis -Continue to address ascites as mentioned, and supportive care -No indication for antibiotics from the standpoint  Acute hepatic encephalopathy-resolved as of 08/17/2020 -Continue lactulose and rifaximin -Mentation has improved back to baseline  Right lower lobe pneumonia -Noted on CT -No hypoxia, mostly cough -Continue Rocephin and doxycycline  Displaced fracture of medial cuneiform of left foot, initial encounter for closed fracture - Cast placed in the ED, outpatient orthopedic follow-up -continue oxy for pain  Mood disorder ([place]) -Stable -Continue Zoloft  Asymptomatic bacteriuria -UA dirty but patient asymptomatic on admission -Concurrently will be treated with antibiotics already in place  Failure to thrive in adult -see generalized weakness  Malnutrition of moderate degree (HCC) -Continue Ensure supplements and current diet order  Thrombocytopenia (HCC) -Chronic in setting of underlying liver disease  Abdominal distension -see abdominal pain  Pancytopenia (HCC) -Trend CBC -Transfuse for hemoglobin less than 7 or platelets less than 10,000    Antimicrobials: Rocephin 08/13/2020>> present Doxycycline 08/16/2020>> present  DVT prophylaxis: SCD Code Status: Full Family Communication:  None present Disposition Plan: Status is: Inpatient  Remains inpatient appropriate because:Unsafe d/c plan, IV treatments appropriate due to intensity of illness or inability to take PO and Inpatient level of care appropriate due to severity of illness   Dispo: The patient is from: Home              Anticipated d/c is to: SNF              Anticipated d/c date is: 2 days              Patient currently is not medically stable to d/c.       Objective: Blood pressure 113/77, pulse 83, temperature 98.8 F (37.1 C), resp. rate 17, height 5' 7"  (1.702 m), weight 57 kg, SpO2 93 %.  Examination: General appearance: Chronically ill-appearing woman laying in bed in no distress. Head: Normocephalic, without obvious abnormality, atraumatic Eyes: EOMI Lungs: Clear anterior breath sounds Heart: regular rate and rhythm and S1, S2 normal Abdomen: Distended, bowel sounds present, nonspecific tenderness throughout with no rebound or guarding Extremities: Thin, no edema.  Left lower extremity wrapped in boot.  Left hand noted with contracted fingers Skin: mobility and turgor normal Neurologic: Left lower extremity strength limited by cast and pain.  Left hand noted with contractures.  No obvious focal deficits.  No asterixis.  Consultants:   GI  Procedures:   Paracentesis, 08/14/2020  Data Reviewed: I have personally reviewed following labs and imaging studies Results for orders placed or performed during the hospital encounter of 08/13/20 (from the past 24 hour(s))  CBC with Differential/Platelet     Status: Abnormal   Collection Time: 08/17/20  6:36 AM  Result Value Ref Range   WBC 7.7 4.0 - 10.5 K/uL   RBC 3.28 (L) 3.87 - 5.11 MIL/uL   Hemoglobin 8.4 (L) 12.0 - 15.0 g/dL   HCT 28.1 (L) 36 - 46 %   MCV 85.7 80.0 - 100.0 fL   MCH 25.6 (L) 26.0 - 34.0 pg   MCHC 29.9 (L) 30.0 - 36.0 g/dL   RDW 19.1 (H) 11.5 - 15.5 %   Platelets 51 (L) 150 - 400 K/uL   nRBC 0.0 0.0 - 0.2 %    Neutrophils Relative % 72 %   Neutro Abs 5.6 1.7 - 7.7 K/uL   Lymphocytes Relative 7 %   Lymphs Abs 0.6 (L) 0.7 - 4.0 K/uL   Monocytes Relative 15 %   Monocytes Absolute 1.2 (H) 0.1 - 1.0 K/uL   Eosinophils Relative 3 %   Eosinophils Absolute 0.2 0.0 - 0.5 K/uL   Basophils Relative 1 %   Basophils Absolute 0.0 0.0 - 0.1 K/uL   Immature Granulocytes 2 %   Abs Immature Granulocytes 0.12 (H) 0.00 - 0.07 K/uL  Comprehensive metabolic panel     Status: Abnormal   Collection Time: 08/17/20  6:36 AM  Result Value Ref Range   Sodium 131 (L) 135 - 145 mmol/L   Potassium 2.7 (LL) 3.5 - 5.1 mmol/L   Chloride 97 (L) 98 - 111 mmol/L   CO2 23 22 - 32 mmol/L   Glucose, Bld 113 (H) 70 - 99 mg/dL   BUN 13 8 - 23 mg/dL   Creatinine, Ser 0.58 0.44 - 1.00 mg/dL   Calcium 8.2 (L) 8.9 - 10.3 mg/dL   Total Protein 6.0 (L) 6.5 - 8.1 g/dL   Albumin 2.9 (L) 3.5 - 5.0 g/dL   AST 34 15 - 41 U/L   ALT 23 0 - 44 U/L   Alkaline  Phosphatase 98 38 - 126 U/L   Total Bilirubin 3.0 (H) 0.3 - 1.2 mg/dL   GFR, Estimated >60 >60 mL/min   Anion gap 11 5 - 15  Magnesium     Status: None   Collection Time: 08/17/20  6:36 AM  Result Value Ref Range   Magnesium 1.9 1.7 - 2.4 mg/dL    Recent Results (from the past 240 hour(s))  Gram stain     Status: None   Collection Time: 08/08/20  2:24 PM   Specimen: Abdomen; Peritoneal Fluid  Result Value Ref Range Status   Specimen Description PERITONEAL ABDOMEN  Final   Special Requests NONE  Final   Gram Stain   Final    WBC PRESENT,BOTH PMN AND MONONUCLEAR NO ORGANISMS SEEN CYTOSPIN SMEAR Performed at [facility], [address], [place], [place] [address]    Report Status 08/08/2020 FINAL  Final  Culture, body fluid-bottle     Status: None   Collection Time: 08/08/20  2:24 PM   Specimen: Fluid  Result Value Ref Range Status   Specimen Description FLUID ABDOMEN PERITONEAL  Final   Special Requests BOTTLES DRAWN AEROBIC AND ANAEROBIC  Final   Culture   Final     NO GROWTH 5 DAYS Performed at [facility], [place] [address] [place], [place] [address]    Report Status 08/13/2020 FINAL  Final  Resp Panel by RT PCR (RSV, Flu A&B, Covid) - Nasopharyngeal Swab     Status: None   Collection Time: 08/13/20  9:42 PM   Specimen: Nasopharyngeal Swab  Result Value Ref Range Status   SARS Coronavirus 2 by RT PCR NEGATIVE NEGATIVE Final    Comment: (NOTE) SARS-CoV-2 target nucleic acids are NOT DETECTED.  The SARS-CoV-2 RNA is generally detectable in upper respiratoy specimens during the acute phase of infection. The lowest concentration of SARS-CoV-2 viral copies this assay can detect is 131 copies/mL. A negative result does not preclude SARS-Cov-2 infection and should not be used as the sole basis for treatment or other patient management decisions. A negative result may occur with  improper specimen collection/handling, submission of specimen other than nasopharyngeal swab, presence of viral mutation(s) within the areas targeted by this assay, and inadequate number of viral copies (<131 copies/mL). A negative result must be combined with clinical observations, patient history, and epidemiological information. The expected result is Negative.  Fact Sheet for Patients:  PinkCheek.be  Fact Sheet for Healthcare Providers:  GravelBags.it  This test is no t yet approved or cleared by the [place] FDA and  has been authorized for detection and/or diagnosis of SARS-CoV-2 by FDA under an Emergency Use Authorization (EUA). This EUA will remain  in effect (meaning this test can be used) for the duration of the COVID-19 declaration under Section 564(b)(1) of the Act, 21 U.S.C. section 360bbb-3(b)(1), unless the authorization is terminated or revoked sooner.     Influenza A by PCR NEGATIVE NEGATIVE Final   Influenza B by PCR NEGATIVE NEGATIVE Final    Comment: (NOTE) The Xpert Xpress  SARS-CoV-2/FLU/RSV assay is intended as an aid in  the diagnosis of influenza from Nasopharyngeal swab specimens and  should not be used as a sole basis for treatment. Nasal washings and  aspirates are unacceptable for Xpert Xpress SARS-CoV-2/FLU/RSV  testing.  Fact Sheet for Patients: PinkCheek.be  Fact Sheet for Healthcare Providers: GravelBags.it  This test is not yet approved or cleared by the [place] FDA and  has been  authorized for detection and/or diagnosis of SARS-CoV-2 by  FDA under an Emergency Use Authorization (EUA). This EUA will remain  in effect (meaning this test can be used) for the duration of the  Covid-19 declaration under Section 564(b)(1) of the Act, 21  U.S.C. section 360bbb-3(b)(1), unless the authorization is  terminated or revoked.    Respiratory Syncytial Virus by PCR NEGATIVE NEGATIVE Final    Comment: (NOTE) Fact Sheet for Patients: PinkCheek.be  Fact Sheet for Healthcare Providers: GravelBags.it  This test is not yet approved or cleared by the [place] FDA and  has been authorized for detection and/or diagnosis of SARS-CoV-2 by  FDA under an Emergency Use Authorization (EUA). This EUA will remain  in effect (meaning this test can be used) for the duration of the  COVID-19 declaration under Section 564(b)(1) of the Act, 21 U.S.C.  section 360bbb-3(b)(1), unless the authorization is terminated or  revoked. Performed at [facility], [address] [place], [place] [address]   Urine culture     Status: Abnormal   Collection Time: 08/13/20 10:52 PM   Specimen: Urine, Random  Result Value Ref Range Status   Specimen Description   Final    URINE, RANDOM Performed at [facility], [address] [place], [place] [address]    Special Requests   Final    NONE Performed at [facility], [address], [place], [place]  [address]    Culture >=100,000 COLONIES/mL CITROBACTER FREUNDII (A)  Final   Report Status 08/16/2020 FINAL  Final   Organism ID, Bacteria CITROBACTER FREUNDII (A)  Final      Susceptibility   Citrobacter freundii - MIC*    CEFAZOLIN RESISTANT Resistant     CEFTRIAXONE <=0.25 SENSITIVE Sensitive     CIPROFLOXACIN <=0.25 SENSITIVE Sensitive     GENTAMICIN <=1 SENSITIVE Sensitive     IMIPENEM <=0.25 SENSITIVE Sensitive     NITROFURANTOIN <=16 SENSITIVE Sensitive     TRIMETH/SULFA <=20 SENSITIVE Sensitive     PIP/TAZO <=4 SENSITIVE Sensitive     * >=100,000 COLONIES/mL CITROBACTER FREUNDII  Body fluid culture     Status: None (Preliminary result)   Collection Time: 08/14/20 11:36 AM   Specimen: Abdomen; Peritoneal Fluid  Result Value Ref Range Status   Specimen Description   Final    ABDOMEN Performed at [facility], [address], [place], [place] [address]    Special Requests   Final    NONE Performed at [facility], [address], [place], [place] [address]    Gram Stain NO WBC SEEN NO ORGANISMS SEEN   Final   Culture   Final    NO GROWTH 3 DAYS Performed at [facility], [place] [address], [place], [place] [address]    Report Status PENDING  Incomplete     Radiology Studies: CT CHEST W CONTRAST  Result Date: 08/16/2020 CLINICAL DATA:  Possible pneumonia on abdomen and pelvis CT scan yesterday which was performed for abdominal pain and distension. EXAM: CT CHEST WITH CONTRAST TECHNIQUE: Multidetector CT imaging of the chest was performed during intravenous contrast administration. CONTRAST:  75 mL OMNIPAQUE IOHEXOL 300 MG/ML  SOLN COMPARISON:  CT abdomen and pelvis 08/15/2020. CT chest, abdomen and pelvis 07/07/2018. FINDINGS: Cardiovascular: No significant vascular findings. Normal heart size. No pericardial effusion. Mediastinum/Nodes: No enlarged mediastinal, hilar, or axillary lymph nodes. Thyroid gland, trachea, and esophagus demonstrate no significant findings.  Lungs/Pleura: Small bilateral pleural effusions are noted. Small locule of air in the patient's  left effusion seen on the patient's CT scan yesterday is no longer present. There is airspace disease in the right lower lobe as seen on yesterday's CT with an appearance most consistent with pneumonia. Upper Abdomen: Cirrhosis, small volume of ascites and changes of portal venous hypertension of ascites again seen. See report of CT scan yesterday. Musculoskeletal: No acute abnormality. Mild, remote superior endplate compression fracture of T8 is seen. The patient also has a remote compression fracture of L1 which is partially imaged. IMPRESSION: Right lower lobe airspace disease has an appearance most consistent with pneumonia and is unchanged since yesterday's exam. Very small bilateral pleural effusions. No air is seen in the left effusion on today's study. Cirrhosis, small volume of abdominal ascites and portal venous hypertension as seen on CT scan yesterday. Electronically Signed   By: Inge Rise M.D.   On: 08/16/2020 15:13   CT ABDOMEN PELVIS W CONTRAST  Result Date: 08/15/2020 CLINICAL DATA:  Abdominal distension, pain. Cirrhosis. Paracentesis performed on 08/14/2020 EXAM: CT ABDOMEN AND PELVIS WITH CONTRAST TECHNIQUE: Multidetector CT imaging of the abdomen and pelvis was performed using the standard protocol following bolus administration of intravenous contrast. CONTRAST:  24m OMNIPAQUE IOHEXOL 300 MG/ML  SOLN COMPARISON:  07/07/2020 FINDINGS: Lower chest: Patchy predominantly ground-glass opacity within the right lower lobe is new from prior. Small left pleural effusion with a small amount of air present within the non dependent portion of the collection (series 5, image 11). Heart size within normal limits. No pericardial effusion. Hepatobiliary: Cirrhotic hepatic morphology. No focal liver lesion is identified. There is recanalization of the umbilical vein. Portal vein is well opacified. Multiple  gallstones present within the gallbladder. No biliary dilatation. Pancreas: Unremarkable. Spleen: Splenomegaly.  No focal splenic lesion. Adrenals/Urinary Tract: Unremarkable adrenal glands. 12 x 7 mm stone remains present within the right renal pelvis. Mild right-sided hydronephrosis. No left hydronephrosis. Kidneys enhance symmetrically. Urinary bladder is unremarkable. Stomach/Bowel: There is long segment mild circumferential wall thickening predominantly involving the sigmoid and descending colon. No definite pericolonic inflammatory changes, although this evaluation is significantly limited given the presence of abdominopelvic ascites. Similar degree of wall thickening involving the cecum and ascending colon. No dilated loops of small bowel. Stomach within normal limits. Vascular/Lymphatic: Findings of portal hypertension. No acute vascular abnormality is identified. No abdominopelvic lymphadenopathy. Reproductive: Unremarkable uterus. Stable appearance of bilateral adnexal cysts, previously characterized as benign on MRI from January 2021. Other: Small to moderate volume ascites. No organized abdominopelvic collection or abscess. No pneumoperitoneum. Musculoskeletal: Posttraumatic and postsurgical changes to the left hip and left hemipelvis. Chronic thoracolumbar compression fractures as well as sacral fracture. No new or acute osseous findings. IMPRESSION: 1. New ground-glass opacity within the right lower lobe suspicious for pneumonia. 2. Small left pleural effusion now containing a small amount of air. Etiology is unclear in the absence of recent thoracentesis. Consider CT chest for further evaluation. 3. Interval development of long segment mild wall thickening within the sigmoid and descending colon. No definite pericolonic inflammatory changes, although this evaluation is significantly limited given the presence of abdominopelvic ascites. Findings may represent an infectious or inflammatory colitis  versus changes related to portal colopathy. 4. Mild right-sided hydronephrosis. 12 x 7 mm stone remains present within the right renal pelvis. 5. Cirrhosis with evidence of portal hypertension including splenomegaly and small to moderate volume ascites. 6. Cholelithiasis. Electronically Signed   By: NDavina PokeD.O.   On: 08/15/2020 17:44   CT CHEST W CONTRAST  Final Result  CT ABDOMEN PELVIS W CONTRAST  Final Result    DG Abd Portable 1V  Final Result    US Paracentesis  Final Result    DG Foot Complete Left  Final Result    CT Head Wo Contrast  Final Result      Scheduled Meds: . feeding supplement  237 mL Oral BID BM  . furosemide  40 mg Oral Daily  . lactulose  20 g Oral TID  . levothyroxine  75 mcg Oral QAC breakfast  . pantoprazole  40 mg Oral Daily  . potassium chloride  40 mEq Oral Q4H  . prochlorperazine  5 mg Oral Q8H  . rifaximin  550 mg Oral BID  . sertraline  50 mg Oral Daily  . spironolactone  150 mg Oral Daily   PRN Meds: diclofenac Sodium, morphine injection, ondansetron, oxyCODONE, traZODone Continuous Infusions: . cefTRIAXone (ROCEPHIN)  IV 1 g (08/17/20 0003)  . doxycycline (VIBRAMYCIN) IV 100 mg (08/17/20 0545)      LOS: 4 days  Time spent: Greater than 50% of the 35 minute visit was spent in counseling/coordination of care for the patient as laid out in the A&P.   Dwyane Dee, MD [facility] 08/17/2020, 1:41 PM

## 2020-08-17 NOTE — Hospital Course (Signed)
Megan Roberson is a 64 y.o. year old female with medical history significant for Pancytopenia, cirrhosis of liver, hypothyroidism, GERD who presented on 08/13/2020 with generalized weakness, nausea, recurrent falls and left foot pain and was found to have profound ascites, hepatic encephalopathy, and acute fracture of medial cuneiform of the left foot.   Patient was started on ceftriaxone due to concern for UTI given positive UA but patient denied any urinary symptoms.  Patient underwent IR guided paracentesis on 10/13 with removal of 2 L of ascitic fluid.  Of note, underwent outpatient IR paracentesis with 3.3 L removed on 10/7. Ascites fluid was negative for SBP.  Culture also remained negative.   Hospital course complicated by refractory, quick accumulating ascites presumed related to poor adherence to diuretic therapy.  GI was consulted on 10/14 to further assist.  CT abdomen showed concern for potential right-sided pneumonia.  She was continued on Rocephin with doxycycline added.

## 2020-08-17 NOTE — Assessment & Plan Note (Signed)
-  Continue lactulose and rifaximin -Mentation has improved back to baseline

## 2020-08-17 NOTE — Assessment & Plan Note (Signed)
-  Chronic in setting of underlying liver disease

## 2020-08-17 NOTE — Assessment & Plan Note (Signed)
-   Cast placed in the ED, outpatient orthopedic follow-up -continue oxy for pain

## 2020-08-17 NOTE — Assessment & Plan Note (Signed)
-  Noncompliance with lactulose and diuretic therapy at home -Underwent paracentesis on 08/14/2020, negative for SBP -Continue lactulose and rifaximin -Continue spironolactone and Lasix -Given 1 dose of albumin on 08/16/2020

## 2020-08-17 NOTE — Assessment & Plan Note (Signed)
-  see abdominal pain

## 2020-08-17 NOTE — Assessment & Plan Note (Signed)
-  see generalized weakness

## 2020-08-17 NOTE — Assessment & Plan Note (Addendum)
-  UA dirty but patient asymptomatic on admission -Concurrently will be treated with antibiotics already in place

## 2020-08-17 NOTE — Assessment & Plan Note (Signed)
-  Stable -Continue Zoloft

## 2020-08-17 NOTE — Assessment & Plan Note (Addendum)
CT abdomen mild wall thickening of the colon that could represent infectious or inflammatory colitis versus changes related to portal colopathy.  Aside from tenderness on the right side of her abdomen she has no diarrhea, no fevers, no leukocytosis -Continue to address ascites as mentioned, and supportive care -No indication for antibiotics from the standpoint - pain remains stable and chronic at baseline

## 2020-08-18 LAB — COMPREHENSIVE METABOLIC PANEL
ALT: 22 U/L (ref 0–44)
AST: 31 U/L (ref 15–41)
Albumin: 2.8 g/dL — ABNORMAL LOW (ref 3.5–5.0)
Alkaline Phosphatase: 95 U/L (ref 38–126)
Anion gap: 6 (ref 5–15)
BUN: 13 mg/dL (ref 8–23)
CO2: 23 mmol/L (ref 22–32)
Calcium: 8.3 mg/dL — ABNORMAL LOW (ref 8.9–10.3)
Chloride: 103 mmol/L (ref 98–111)
Creatinine, Ser: 0.55 mg/dL (ref 0.44–1.00)
GFR, Estimated: >60 mL/min (ref >60)
Glucose, Bld: 105 mg/dL — ABNORMAL HIGH (ref 70–99)
Potassium: 3 mmol/L — ABNORMAL LOW (ref 3.5–5.1)
Sodium: 132 mmol/L — ABNORMAL LOW (ref 135–145)
Total Bilirubin: 2.3 mg/dL — ABNORMAL HIGH (ref 0.3–1.2)
Total Protein: 5.9 g/dL — ABNORMAL LOW (ref 6.5–8.1)

## 2020-08-18 LAB — CBC WITH DIFFERENTIAL/PLATELET
Abs Immature Granulocytes: 0.1 10*3/uL — ABNORMAL HIGH (ref 0.00–0.07)
Basophils Absolute: 0 10*3/uL (ref 0.0–0.1)
Basophils Relative: 0 %
Eosinophils Absolute: 0.3 10*3/uL (ref 0.0–0.5)
Eosinophils Relative: 4 %
HCT: 27.8 % — ABNORMAL LOW (ref 36.0–46.0)
Hemoglobin: 8.3 g/dL — ABNORMAL LOW (ref 12.0–15.0)
Immature Granulocytes: 1 %
Lymphocytes Relative: 10 %
Lymphs Abs: 0.8 10*3/uL (ref 0.7–4.0)
MCH: 25.8 pg — ABNORMAL LOW (ref 26.0–34.0)
MCHC: 29.9 g/dL — ABNORMAL LOW (ref 30.0–36.0)
MCV: 86.3 fL (ref 80.0–100.0)
Monocytes Absolute: 1.1 10*3/uL — ABNORMAL HIGH (ref 0.1–1.0)
Monocytes Relative: 14 %
Neutro Abs: 5.5 10*3/uL (ref 1.7–7.7)
Neutrophils Relative %: 71 %
Platelets: 50 10*3/uL — ABNORMAL LOW (ref 150–400)
RBC: 3.22 MIL/uL — ABNORMAL LOW (ref 3.87–5.11)
RDW: 19.1 % — ABNORMAL HIGH (ref 11.5–15.5)
WBC: 7.9 10*3/uL (ref 4.0–10.5)
nRBC: 0 % (ref 0.0–0.2)

## 2020-08-18 LAB — MAGNESIUM: Magnesium: 1.8 mg/dL (ref 1.7–2.4)

## 2020-08-18 LAB — BODY FLUID CULTURE
Culture: NO GROWTH
Gram Stain: NONE SEEN

## 2020-08-18 LAB — T4, FREE: Free T4: 0.85 ng/dL (ref 0.61–1.12)

## 2020-08-18 LAB — T3: T3, Total: 58 ng/dL — ABNORMAL LOW (ref 71–180)

## 2020-08-18 MED ORDER — POTASSIUM CHLORIDE CRYS ER 20 MEQ PO TBCR
40.0000 meq | EXTENDED_RELEASE_TABLET | ORAL | Status: AC
  Administered 2020-08-18 (×2): 40 meq via ORAL
  Filled 2020-08-18 (×2): qty 2

## 2020-08-18 NOTE — Progress Notes (Signed)
PROGRESS NOTE    Megan Roberson   OAC:[id number]  DOB: 03/22/1956  DOA: 08/13/2020     5  PCP: Celene Squibb, MD  CC: weakness, foot pain  Hospital Course: Megan Roberson is a 64 y.o. year old female with medical history significant for Pancytopenia, cirrhosis of liver, hypothyroidism, GERD who presented on 08/13/2020 with generalized weakness, nausea, recurrent falls and left foot pain and was found to have profound ascites, hepatic encephalopathy, and acute fracture of medial cuneiform of the left foot.   Patient was started on ceftriaxone due to concern for UTI given positive UA but patient denied any urinary symptoms.  Patient underwent IR guided paracentesis on 10/13 with removal of 2 L of ascitic fluid.  Of note, underwent outpatient IR paracentesis with 3.3 L removed on 10/7. Ascites fluid was negative for SBP.  Culture also remained negative.   Hospital course complicated by refractory, quick accumulating ascites presumed related to poor adherence to diuretic therapy.  GI was consulted on 10/14 to further assist.  CT abdomen showed concern for potential right-sided pneumonia.  She was continued on Rocephin with doxycycline added.   Interval History:  No events overnight.  Abd pain is stable and at her baseline she says.  Was complaining of a little bit of difficulty with swallowing this morning but has been tolerating her pills and food.  Otherwise no other concerns.  Old records reviewed in assessment of this patient  ROS: Constitutional: negative for chills and fevers, Respiratory: negative for cough, Cardiovascular: negative for chest pain and Gastrointestinal: negative for vomiting  Assessment & Plan: * Generalized weakness - Suspect general failure to thrive in setting of chronic liver disease, B12 within normal limits, TSH slightly elevated at 6.5, thiamine deficiency also possibility, electrolytes all within normal limits -Check B1 -Continue home Synthroid; TSH 6.506;  FT4 0.85. No dose adjustment; repeat TSH in 4-6 weeks outpatient -PT eval given left foot fracture; possible placement; SW has sent FL2 -Dietary consulted, protein supplementation - stable for discharge when bed available   Decompensated hepatic cirrhosis ([place]) -Noncompliance with lactulose and diuretic therapy at home -Underwent paracentesis on 08/14/2020, negative for SBP -Continue lactulose and rifaximin -Continue spironolactone and Lasix -Given 1 dose of albumin on 08/16/2020  Abdominal pain CT abdomen mild wall thickening of the colon that could represent infectious or inflammatory colitis versus changes related to portal colopathy.  Aside from tenderness on the right side of her abdomen she has no diarrhea, no fevers, no leukocytosis -Continue to address ascites as mentioned, and supportive care -No indication for antibiotics from the standpoint - pain remains stable and chronic at baseline   Acute hepatic encephalopathy-resolved as of 08/17/2020 -Continue lactulose and rifaximin -Mentation has improved back to baseline  Right lower lobe pneumonia -Noted on CT -No hypoxia, mostly cough -Continue Rocephin and doxycycline - check PCT in am; if negative would stop abx at this point   Displaced fracture of medial cuneiform of left foot, initial encounter for closed fracture - Cast placed in the ED, outpatient orthopedic follow-up -continue oxy for pain  Mood disorder ([place]) -Stable -Continue Zoloft  Asymptomatic bacteriuria -UA dirty but patient asymptomatic on admission -Concurrently will be treated with antibiotics already in place  Failure to thrive in adult -see generalized weakness  Malnutrition of moderate degree (HCC) -Continue Ensure supplements and current diet order  Thrombocytopenia (HCC) -Chronic in setting of underlying liver disease  Abdominal distension -see abdominal pain  Pancytopenia (HCC) -Trend CBC -Transfuse for hemoglobin less  than 7 or  platelets less than 10,000   Antimicrobials: Rocephin 08/13/2020>> present Doxycycline 08/16/2020>> present  DVT prophylaxis: SCD Code Status: Full Family Communication: None present Disposition Plan: Status is: Inpatient  Remains inpatient appropriate because:Unsafe d/c plan, IV treatments appropriate due to intensity of illness or inability to take PO and Inpatient level of care appropriate due to severity of illness   Dispo: The patient is from: Home              Anticipated d/c is to: SNF              Anticipated d/c date is: 2 days              Patient currently is not medically stable to d/c.  Objective: Blood pressure 122/65, pulse 80, temperature 98.1 F (36.7 C), temperature source Oral, resp. rate 16, height 5' 7"  (1.702 m), weight 57 kg, SpO2 94 %.  Examination: General appearance: Chronically ill-appearing woman laying in bed in no distress. Head: Normocephalic, without obvious abnormality, atraumatic Eyes: EOMI Lungs: Clear anterior breath sounds Heart: regular rate and rhythm and S1, S2 normal Abdomen: Distended, bowel sounds present, nonspecific tenderness throughout with no rebound or guarding Extremities: Thin, no edema.  Left lower extremity wrapped in boot.  Left hand noted with contracted fingers Skin: mobility and turgor normal Neurologic: Left lower extremity strength limited by cast and pain.  Left hand noted with contractures.  No obvious focal deficits.  No asterixis.  Consultants:   GI  Procedures:   Paracentesis, 08/14/2020  Data Reviewed: I have personally reviewed following labs and imaging studies Results for orders placed or performed during the hospital encounter of 08/13/20 (from the past 24 hour(s))  Comprehensive metabolic panel     Status: Abnormal   Collection Time: 08/18/20  6:07 AM  Result Value Ref Range   Sodium 132 (L) 135 - 145 mmol/L   Potassium 3.0 (L) 3.5 - 5.1 mmol/L   Chloride 103 98 - 111 mmol/L   CO2 23 22 - 32  mmol/L   Glucose, Bld 105 (H) 70 - 99 mg/dL   BUN 13 8 - 23 mg/dL   Creatinine, Ser 0.55 0.44 - 1.00 mg/dL   Calcium 8.3 (L) 8.9 - 10.3 mg/dL   Total Protein 5.9 (L) 6.5 - 8.1 g/dL   Albumin 2.8 (L) 3.5 - 5.0 g/dL   AST 31 15 - 41 U/L   ALT 22 0 - 44 U/L   Alkaline Phosphatase 95 38 - 126 U/L   Total Bilirubin 2.3 (H) 0.3 - 1.2 mg/dL   GFR, Estimated >60 >60 mL/min   Anion gap 6 5 - 15  CBC with Differential/Platelet     Status: Abnormal   Collection Time: 08/18/20  6:07 AM  Result Value Ref Range   WBC 7.9 4.0 - 10.5 K/uL   RBC 3.22 (L) 3.87 - 5.11 MIL/uL   Hemoglobin 8.3 (L) 12.0 - 15.0 g/dL   HCT 27.8 (L) 36 - 46 %   MCV 86.3 80.0 - 100.0 fL   MCH 25.8 (L) 26.0 - 34.0 pg   MCHC 29.9 (L) 30.0 - 36.0 g/dL   RDW 19.1 (H) 11.5 - 15.5 %   Platelets 50 (L) 150 - 400 K/uL   nRBC 0.0 0.0 - 0.2 %   Neutrophils Relative % 71 %   Neutro Abs 5.5 1.7 - 7.7 K/uL   Lymphocytes Relative 10 %   Lymphs Abs 0.8 0.7 - 4.0 K/uL  Monocytes Relative 14 %   Monocytes Absolute 1.1 (H) 0.1 - 1.0 K/uL   Eosinophils Relative 4 %   Eosinophils Absolute 0.3 0.0 - 0.5 K/uL   Basophils Relative 0 %   Basophils Absolute 0.0 0.0 - 0.1 K/uL   Immature Granulocytes 1 %   Abs Immature Granulocytes 0.10 (H) 0.00 - 0.07 K/uL  Magnesium     Status: None   Collection Time: 08/18/20  6:07 AM  Result Value Ref Range   Magnesium 1.8 1.7 - 2.4 mg/dL  T4, free     Status: None   Collection Time: 08/18/20  6:07 AM  Result Value Ref Range   Free T4 0.85 0.61 - 1.12 ng/dL    Recent Results (from the past 240 hour(s))  Gram stain     Status: None   Collection Time: 08/08/20  2:24 PM   Specimen: Abdomen; Peritoneal Fluid  Result Value Ref Range Status   Specimen Description PERITONEAL ABDOMEN  Final   Special Requests NONE  Final   Gram Stain   Final    WBC PRESENT,BOTH PMN AND MONONUCLEAR NO ORGANISMS SEEN CYTOSPIN SMEAR Performed at [facility], [address] [place],  [address]     Report Status 08/08/2020 FINAL  Final  Culture, body fluid-bottle     Status: None   Collection Time: 08/08/20  2:24 PM   Specimen: Fluid  Result Value Ref Range Status   Specimen Description FLUID ABDOMEN PERITONEAL  Final   Special Requests BOTTLES DRAWN AEROBIC AND ANAEROBIC  Final   Culture   Final    NO GROWTH 5 DAYS Performed at [facility], [place] [address] [place],  [address]    Report Status 08/13/2020 FINAL  Final  Resp Panel by RT PCR (RSV, Flu A&B, Covid) - Nasopharyngeal Swab     Status: None   Collection Time: 08/13/20  9:42 PM   Specimen: Nasopharyngeal Swab  Result Value Ref Range Status   SARS Coronavirus 2 by RT PCR NEGATIVE NEGATIVE Final    Comment: (NOTE) SARS-CoV-2 target nucleic acids are NOT DETECTED.  The SARS-CoV-2 RNA is generally detectable in upper respiratoy specimens during the acute phase of infection. The lowest concentration of SARS-CoV-2 viral copies this assay can detect is 131 copies/mL. A negative result does not preclude SARS-Cov-2 infection and should not be used as the sole basis for treatment or other patient management decisions. A negative result may occur with  improper specimen collection/handling, submission of specimen other than nasopharyngeal swab, presence of viral mutation(s) within the areas targeted by this assay, and inadequate number of viral copies (<131 copies/mL). A negative result must be combined with clinical observations, patient history, and epidemiological information. The expected result is Negative.  Fact Sheet for Patients:  PinkCheek.be  Fact Sheet for Healthcare Providers:  GravelBags.it  This test is no t yet approved or cleared by the [place] FDA and  has been authorized for detection and/or diagnosis of SARS-CoV-2 by FDA under an Emergency Use Authorization (EUA). This EUA will remain  in effect (meaning this test can be used)  for the duration of the COVID-19 declaration under Section 564(b)(1) of the Act, 21 U.S.C. section 360bbb-3(b)(1), unless the authorization is terminated or revoked sooner.     Influenza A by PCR NEGATIVE NEGATIVE Final   Influenza B by PCR NEGATIVE NEGATIVE Final    Comment: (NOTE) The Xpert Xpress SARS-CoV-2/FLU/RSV assay is intended as an aid in  the diagnosis of  influenza from Nasopharyngeal swab specimens and  should not be used as a sole basis for treatment. Nasal washings and  aspirates are unacceptable for Xpert Xpress SARS-CoV-2/FLU/RSV  testing.  Fact Sheet for Patients: PinkCheek.be  Fact Sheet for Healthcare Providers: GravelBags.it  This test is not yet approved or cleared by the [place] FDA and  has been authorized for detection and/or diagnosis of SARS-CoV-2 by  FDA under an Emergency Use Authorization (EUA). This EUA will remain  in effect (meaning this test can be used) for the duration of the  Covid-19 declaration under Section 564(b)(1) of the Act, 21  U.S.C. section 360bbb-3(b)(1), unless the authorization is  terminated or revoked.    Respiratory Syncytial Virus by PCR NEGATIVE NEGATIVE Final    Comment: (NOTE) Fact Sheet for Patients: PinkCheek.be  Fact Sheet for Healthcare Providers: GravelBags.it  This test is not yet approved or cleared by the [place] FDA and  has been authorized for detection and/or diagnosis of SARS-CoV-2 by  FDA under an Emergency Use Authorization (EUA). This EUA will remain  in effect (meaning this test can be used) for the duration of the  COVID-19 declaration under Section 564(b)(1) of the Act, 21 U.S.C.  section 360bbb-3(b)(1), unless the authorization is terminated or  revoked. Performed at [facility], [address], [place], [place] [address]   Urine culture     Status: Abnormal   Collection  Time: 08/13/20 10:52 PM   Specimen: Urine, Random  Result Value Ref Range Status   Specimen Description   Final    URINE, RANDOM Performed at [facility], [address] [place], [place] [address]    Special Requests   Final    NONE Performed at [facility], [address], [place], [place] [address]    Culture >=100,000 COLONIES/mL CITROBACTER FREUNDII (A)  Final   Report Status 08/16/2020 FINAL  Final   Organism ID, Bacteria CITROBACTER FREUNDII (A)  Final      Susceptibility   Citrobacter freundii - MIC*    CEFAZOLIN RESISTANT Resistant     CEFTRIAXONE <=0.25 SENSITIVE Sensitive     CIPROFLOXACIN <=0.25 SENSITIVE Sensitive     GENTAMICIN <=1 SENSITIVE Sensitive     IMIPENEM <=0.25 SENSITIVE Sensitive     NITROFURANTOIN <=16 SENSITIVE Sensitive     TRIMETH/SULFA <=20 SENSITIVE Sensitive     PIP/TAZO <=4 SENSITIVE Sensitive     * >=100,000 COLONIES/mL CITROBACTER FREUNDII  Body fluid culture     Status: None   Collection Time: 08/14/20 11:36 AM   Specimen: Abdomen; Peritoneal Fluid  Result Value Ref Range Status   Specimen Description   Final    ABDOMEN Performed at [facility], [address], [place], [place] [address]    Special Requests   Final    NONE Performed at [facility], [address], [place], [place] [address]    Gram Stain NO WBC SEEN NO ORGANISMS SEEN   Final   Culture   Final    NO GROWTH 3 DAYS Performed at [facility], [place] [address], [place], [place] [address]    Report Status 08/18/2020 FINAL  Final     Radiology Studies: CT CHEST W CONTRAST  Result Date: 08/16/2020 CLINICAL DATA:  Possible pneumonia on abdomen and pelvis CT scan yesterday which was performed for abdominal pain and distension. EXAM: CT CHEST WITH CONTRAST TECHNIQUE: Multidetector CT imaging of the chest was performed during intravenous contrast administration. CONTRAST:  75 mL OMNIPAQUE IOHEXOL 300 MG/ML  SOLN COMPARISON:  CT abdomen and pelvis 08/15/2020. CT chest,  abdomen and pelvis 07/07/2018. FINDINGS: Cardiovascular: No significant vascular findings. Normal heart size. No pericardial effusion. Mediastinum/Nodes: No enlarged mediastinal, hilar, or axillary lymph nodes. Thyroid gland, trachea, and esophagus demonstrate no significant findings. Lungs/Pleura: Small bilateral pleural effusions are noted. Small locule of air in the patient's left effusion seen on the patient's CT scan yesterday is no longer present. There is airspace disease in the right lower lobe as seen on yesterday's CT with an appearance most consistent with pneumonia. Upper Abdomen: Cirrhosis, small volume of ascites and changes of portal venous hypertension of ascites again seen. See report of CT scan yesterday. Musculoskeletal: No acute abnormality. Mild, remote superior endplate compression fracture of T8 is seen. The patient also has a remote compression fracture of L1 which is partially imaged. IMPRESSION: Right lower lobe airspace disease has an appearance most consistent with pneumonia and is unchanged since yesterday's exam. Very small bilateral pleural effusions. No air is seen in the left effusion on today's study. Cirrhosis, small volume of abdominal ascites and portal venous hypertension as seen on CT scan yesterday. Electronically Signed   By: Inge Rise M.D.   On: 08/16/2020 15:13   CT CHEST W CONTRAST  Final Result    CT ABDOMEN PELVIS W CONTRAST  Final Result    DG Abd Portable 1V  Final Result    US Paracentesis  Final Result    DG Foot Complete Left  Final Result    CT Head Wo Contrast  Final Result      Scheduled Meds: . feeding supplement  237 mL Oral BID BM  . furosemide  40 mg Oral Daily  . lactulose  20 g Oral TID  . levothyroxine  75 mcg Oral QAC breakfast  . pantoprazole  40 mg Oral Daily  . potassium chloride  40 mEq Oral Q4H  . prochlorperazine  5 mg Oral Q8H  . rifaximin  550 mg Oral BID  . sertraline  50 mg Oral Daily  . spironolactone  150  mg Oral Daily   PRN Meds: diclofenac Sodium, morphine injection, ondansetron, oxyCODONE, traZODone Continuous Infusions: . cefTRIAXone (ROCEPHIN)  IV 1 g (08/18/20 0030)  . doxycycline (VIBRAMYCIN) IV 100 mg (08/18/20 0627)      LOS: 5 days  Time spent: Greater than 50% of the 35 minute visit was spent in counseling/coordination of care for the patient as laid out in the A&P.   Dwyane Dee, MD [facility] 08/18/2020, 1:41 PM

## 2020-08-19 DIAGNOSIS — R188 Other ascites: Secondary | ICD-10-CM | POA: Diagnosis not present

## 2020-08-19 DIAGNOSIS — K7460 Unspecified cirrhosis of liver: Secondary | ICD-10-CM | POA: Diagnosis not present

## 2020-08-19 DIAGNOSIS — R8271 Bacteriuria: Secondary | ICD-10-CM

## 2020-08-19 LAB — CBC WITH DIFFERENTIAL/PLATELET
Abs Immature Granulocytes: 0.1 10*3/uL — ABNORMAL HIGH (ref 0.00–0.07)
Basophils Absolute: 0 10*3/uL (ref 0.0–0.1)
Basophils Relative: 1 %
Eosinophils Absolute: 0.4 10*3/uL (ref 0.0–0.5)
Eosinophils Relative: 7 %
HCT: 28.2 % — ABNORMAL LOW (ref 36.0–46.0)
Hemoglobin: 8.2 g/dL — ABNORMAL LOW (ref 12.0–15.0)
Immature Granulocytes: 2 %
Lymphocytes Relative: 11 %
Lymphs Abs: 0.6 10*3/uL — ABNORMAL LOW (ref 0.7–4.0)
MCH: 25.5 pg — ABNORMAL LOW (ref 26.0–34.0)
MCHC: 29.1 g/dL — ABNORMAL LOW (ref 30.0–36.0)
MCV: 87.6 fL (ref 80.0–100.0)
Monocytes Absolute: 0.8 10*3/uL (ref 0.1–1.0)
Monocytes Relative: 14 %
Neutro Abs: 3.8 10*3/uL (ref 1.7–7.7)
Neutrophils Relative %: 65 %
Platelets: 48 10*3/uL — ABNORMAL LOW (ref 150–400)
RBC: 3.22 MIL/uL — ABNORMAL LOW (ref 3.87–5.11)
RDW: 19 % — ABNORMAL HIGH (ref 11.5–15.5)
WBC: 5.7 10*3/uL (ref 4.0–10.5)
nRBC: 0 % (ref 0.0–0.2)

## 2020-08-19 LAB — COMPREHENSIVE METABOLIC PANEL
ALT: 20 U/L (ref 0–44)
AST: 28 U/L (ref 15–41)
Albumin: 2.8 g/dL — ABNORMAL LOW (ref 3.5–5.0)
Alkaline Phosphatase: 91 U/L (ref 38–126)
Anion gap: 11 (ref 5–15)
BUN: 12 mg/dL (ref 8–23)
CO2: 24 mmol/L (ref 22–32)
Calcium: 8.5 mg/dL — ABNORMAL LOW (ref 8.9–10.3)
Chloride: 99 mmol/L (ref 98–111)
Creatinine, Ser: 0.53 mg/dL (ref 0.44–1.00)
GFR, Estimated: >60 mL/min (ref >60)
Glucose, Bld: 106 mg/dL — ABNORMAL HIGH (ref 70–99)
Potassium: 3.5 mmol/L (ref 3.5–5.1)
Sodium: 134 mmol/L — ABNORMAL LOW (ref 135–145)
Total Bilirubin: 2.2 mg/dL — ABNORMAL HIGH (ref 0.3–1.2)
Total Protein: 5.8 g/dL — ABNORMAL LOW (ref 6.5–8.1)

## 2020-08-19 LAB — PROTIME-INR
INR: 1.5 — ABNORMAL HIGH (ref 0.8–1.2)
Prothrombin Time: 17.3 seconds — ABNORMAL HIGH (ref 11.4–15.2)

## 2020-08-19 LAB — PROCALCITONIN: Procalcitonin: <0.1 ng/mL

## 2020-08-19 LAB — MAGNESIUM: Magnesium: 1.8 mg/dL (ref 1.7–2.4)

## 2020-08-19 NOTE — Progress Notes (Signed)
INR 1.5. MELD Na 17.

## 2020-08-19 NOTE — Progress Notes (Signed)
[facility]  PROGRESS NOTE  Megan Roberson KXF:[id number] DOB: 03/08/1956 DOA: 08/13/2020 PCP: Celene Squibb, MD Admit date - 08/13/2020   Admitting Physician Bernadette Hoit, DO  Outpatient Primary MD for the patient is Celene Squibb, MD  LOS - 6 Brief Narrative   Megan Roberson is a 64 y.o. year old female with medical history significant for Pancytopenia, cirrhosis of liver, hypothyroidism, GERD who presents on 08/13/2020 with generalized weakness, nausea, recurrent falls and left foot pain and was found to have profound ascites, hepatic encephalopathy, and acute fracture of medial cuneiform of the left foot.  Patient was started on ceftriaxone due to concern for UTI given positive UA but patient denies any urinary symptoms.  Patient underwent IR guided paracentesis on 10/13 with removal of 2 L of ascitic fluid.  Of note, underwent outpatient IR paracentesis with 3.3 L removed on 10/7  Hospital course complicated by refractory, quick accumulating ascites presume related to poor adherence to diuretic therapy.  GI was consulted on 10/14 to further assist.  CT abdomen now shows concern for potential right-sided pneumonia as well as questionable inflammatory component of: Induration to left pleural effusion with possible air bubble.  CT chest confirmed what seemed to be pneumonia though patient still only have mild cough no hypoxia.  Procalcitonin was unremarkable so IV doxycycline/ceftriaxone discontinued on 10/18 patient did receive IV albumin as directed by GI on 10/14 given refractory ascites   Subjective  Today she has no abdominal pain, no nausea or vomiting.  No fevers or chills.  No cough.  No shortness of breath  A & P   Decompensated cirrhosis with hepatic encephalopathy and ascites in the setting of nonadherence with lactulose therapy due to nausea/poor taste as well as diuretics.  Mental status is appropriate, only has mild ascites.  Status post paracentesis with 2.2 L removed on  10/13.  Ascitic fluid not consistent with SBP.  Status post IV albumin on 10/14 for refractory ascites Denies any hematemesis or melena.  No asterixis on exam -continue lactulose, rifaximin -Continue home spironolactone 150 mg daily, resume Lasix at 40 mg -Appreciate GI recommendations, -Continue outpatient appointment arranged with Dr. Havery Moros on 11/12  Abdominal pain likely multifactorial including recurrent ascites as well as concern for potential colitis, stable. CT abdomen mild wall thickening of the colon that could represent infectious or inflammatory colitis versus changes related to portal colopathy.  Aside from tenderness on the right side of her abdomen she has no diarrhea, no fevers, no leukocytosis -Continue to address ascites mentioned above, and supportive care -Do not think this warrants antibiotic therapy currently  New groundglass opacity concerning for possible pneumonia and small left pleural effusion containing a small amount of air per CT abdomen CT chest shows no air, dimension potential groundglass opacity concerning for pneumonia, patient remains stable on room air, no shortness of breath only mild cough.  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  Iron panel seems more consistent with iron deficiency.  Not having any active signs of bleeding -Closely monitor CBC, transfuse hemoglobin less than 7, platelets less than 10,000  Generalized weakness with recurrent falls, high concern for failure to thrive Suspect general failure to thrive in setting of chronic liver disease, B12 within normal limits, TSH slightly elevated at 6.5, thiamine deficiency also possibility, electrolytes all within normal limits -  Check  B1 -Continue home Synthroid -PT eval given left foot fracture--recommend SNF for rehab -Dietary consulted, protein supplementation  Moderate malnutrition, related to chronic illness.  As evident by moderate fat/muscle depletion -Appreciate nutrition recommendations -We will continue Ensure p.o. twice daily as well as supplementation  Abnormal UA.  Denies any urinary symptoms , not likely UTI -IV ceftriaxone discontinued  GERD, stable -Continue oral PPI  Mood disorder, stable -Continue home Zoloft  Left minimally displaced medial cuneiform foot fracture secondary to fall Cast placed in the ED, outpatient orthopedic follow-up -Oxycodone 5 mg every 8 hours for moderate/severe pain   Family Communication  : None at bedside, will update husband per patient request  Code Status : Full  Disposition Plan  :  Patient is from home. Anticipated d/c date:  Medically stable for discharge. Barriers to d/c or necessity for inpatient status:  Awaiting insurance authorization for rehab/SNF Consults  : Gastroenterology  Procedures  : IR guided paracentesis, 10/13  DVT Prophylaxis  : SCDs MDM: The below labs and imaging reports were reviewed and summarized above.  Medication management as above.  Lab Results  Component Value Date   PLT 48 (L) 08/19/2020    Diet :  Diet Order            Diet 2 gram sodium Room service appropriate? Yes; Fluid consistency: Thin  Diet effective now                  Inpatient Medications Scheduled Meds: . feeding supplement  237 mL Oral BID BM  . furosemide  40 mg Oral Daily  . lactulose  20 g Oral TID  . levothyroxine  75 mcg Oral QAC breakfast  . pantoprazole  40 mg Oral Daily  . prochlorperazine  5 mg Oral Q8H  . rifaximin  550 mg Oral BID  . sertraline  50 mg Oral Daily  . spironolactone  150 mg Oral Daily   Continuous Infusions:  PRN Meds:.diclofenac Sodium, morphine injection, ondansetron, oxyCODONE, traZODone  Antibiotics  :    Anti-infectives (From admission, onward)   Start     Dose/Rate Route Frequency Ordered Stop   08/16/20 1730  doxycycline (VIBRAMYCIN) 100 mg in sodium chloride 0.9 % 250 mL IVPB  Status:  Discontinued        100 mg 125 mL/hr over 120 Minutes Intravenous Every 12 hours 08/16/20 1616 08/19/20 0808   08/14/20 2359  cefTRIAXone (ROCEPHIN) 1 g in sodium chloride 0.9 % 100 mL IVPB  Status:  Discontinued        1 g 200 mL/hr over 30 Minutes Intravenous Every 24 hours 08/14/20 0120 08/19/20 0808   08/14/20 1000  rifaximin (XIFAXAN) tablet 550 mg        550 mg Oral 2 times daily 08/14/20 0834     08/13/20 2300  cefTRIAXone (ROCEPHIN) 1 g in sodium chloride 0.9 % 100 mL IVPB        1 g 200 mL/hr over 30 Minutes Intravenous  Once 08/13/20 2257 08/14/20 0026       Objective   Vitals:   08/18/20 1500 08/18/20 2117 08/19/20 0453 08/19/20 1412  BP: 118/72 126/75 131/79 111/70  Pulse: 76 82 75 91  Resp: 16 20 16 18   Temp: 98.5 F (36.9 C) 98.9 F (37.2 C) 97.7 F (36.5 C) 98 F (36.7 C)  TempSrc: Oral Oral Oral Oral  SpO2: 98% 98% 99% 98%  Weight:      Height:  SpO2: 98 % O2 Flow Rate (L/min): 0 L/min FiO2 (%): 21 %  Wt Readings from Last 3 Encounters:  08/14/20 57 kg  08/06/20 54.9 kg  06/04/20 63.5 kg     Intake/Output Summary (Last 24 hours) at 08/19/2020 1504 Last data filed at 08/19/2020 0500 Gross per 24 hour  Intake 240 ml  Output 300 ml  Net -60 ml    Physical Exam:     Awake Alert, Oriented to self, place, context, no asterixis Thin, frail No new F.N deficits,  Hall.AT, Normal respiratory effort on room air, CTAB RRR,No Gallops,Rubs or new Murmurs, no peripheral edema Diminished abdominal sounds, soft,  distended, without tenderness Bruising along left toes   I have personally reviewed the following:   Data Reviewed:  CBC Recent Labs  Lab 08/15/20 0616 08/16/20 0613 08/17/20 0636 08/18/20 0607 08/19/20 0552  WBC 5.4 6.9 7.7 7.9 5.7  HGB  7.9* 8.2* 8.4* 8.3* 8.2*  HCT 26.8* 26.9* 28.1* 27.8* 28.2*  PLT 50* 49* 51* 50* 48*  MCV 84.8 83.8 85.7 86.3 87.6  MCH 25.0* 25.5* 25.6* 25.8* 25.5*  MCHC 29.5* 30.5 29.9* 29.9* 29.1*  RDW 19.4* 19.1* 19.1* 19.1* 19.0*  LYMPHSABS 0.5* 0.8 0.6* 0.8 0.6*  MONOABS 0.8 0.9 1.2* 1.1* 0.8  EOSABS 0.2 0.2 0.2 0.3 0.4  BASOSABS 0.1 0.0 0.0 0.0 0.0    Chemistries  Recent Labs  Lab 08/14/20 0621 08/14/20 0621 08/15/20 0616 08/16/20 0613 08/17/20 0636 08/18/20 0607 08/19/20 0552  NA 133*   < > 132* 129* 131* 132* 134*  K 3.6   < > 3.5 3.0* 2.7* 3.0* 3.5  CL 104   < > 103 99 97* 103 99  CO2 22   < > 22 22 23 23 24   GLUCOSE 99   < > 83 109* 113* 105* 106*  BUN 16   < > 14 13 13 13 12   CREATININE 0.50   < > 0.53 0.57 0.58 0.55 0.53  CALCIUM 8.3*   < > 8.1* 8.0* 8.2* 8.3* 8.5*  MG 1.9  --   --  1.9 1.9 1.8 1.8  AST 39   < > 44* 42* 34 31 28  ALT 26   < > 24 25 23 22 20   ALKPHOS 115   < > 105 104 98 95 91  BILITOT 2.3*   < > 2.0* 2.8* 3.0* 2.3* 2.2*   < > = values in this interval not displayed.   ------------------------------------------------------------------------------------------------------------------ No results for input(s): CHOL, HDL, LDLCALC, TRIG, CHOLHDL, LDLDIRECT in the last 72 hours.  No results found for: HGBA1C ------------------------------------------------------------------------------------------------------------------ No results for input(s): TSH, T4TOTAL, T3FREE, THYROIDAB in the last 72 hours.  Invalid input(s): FREET3 ------------------------------------------------------------------------------------------------------------------ No results for input(s): VITAMINB12, FOLATE, FERRITIN, TIBC, IRON, RETICCTPCT in the last 72 hours.  Coagulation profile Recent Labs  Lab 08/13/20 1815 08/14/20 0621 08/19/20 1142  INR 1.3* 1.4* 1.5*    No results for input(s): DDIMER in the last 72 hours.  Cardiac Enzymes No results for input(s): CKMB, TROPONINI,  MYOGLOBIN in the last 168 hours.  Invalid input(s): CK ------------------------------------------------------------------------------------------------------------------    Component Value Date/Time   BNP 114.0 (H) 05/18/2020 2209    Micro Results Recent Results (from the past 240 hour(s))  Resp Panel by RT PCR (RSV, Flu A&B, Covid) - Nasopharyngeal Swab     Status: None   Collection Time: 08/13/20  9:42 PM   Specimen: Nasopharyngeal Swab  Result Value Ref Range Status   SARS Coronavirus  2 by RT PCR NEGATIVE NEGATIVE Final    Comment: (NOTE) SARS-CoV-2 target nucleic acids are NOT DETECTED.  The SARS-CoV-2 RNA is generally detectable in upper respiratoy specimens during the acute phase of infection. The lowest concentration of SARS-CoV-2 viral copies this assay can detect is 131 copies/mL. A negative result does not preclude SARS-Cov-2 infection and should not be used as the sole basis for treatment or other patient management decisions. A negative result may occur with  improper specimen collection/handling, submission of specimen other than nasopharyngeal swab, presence of viral mutation(s) within the areas targeted by this assay, and inadequate number of viral copies (<131 copies/mL). A negative result must be combined with clinical observations, patient history, and epidemiological information. The expected result is Negative.  Fact Sheet for Patients:  PinkCheek.be  Fact Sheet for Healthcare Providers:  GravelBags.it  This test is no t yet approved or cleared by the [place] FDA and  has been authorized for detection and/or diagnosis of SARS-CoV-2 by FDA under an Emergency Use Authorization (EUA). This EUA will remain  in effect (meaning this test can be used) for the duration of the COVID-19 declaration under Section 564(b)(1) of the Act, 21 U.S.C. section 360bbb-3(b)(1), unless the authorization is  terminated or revoked sooner.     Influenza A by PCR NEGATIVE NEGATIVE Final   Influenza B by PCR NEGATIVE NEGATIVE Final    Comment: (NOTE) The Xpert Xpress SARS-CoV-2/FLU/RSV assay is intended as an aid in  the diagnosis of influenza from Nasopharyngeal swab specimens and  should not be used as a sole basis for treatment. Nasal washings and  aspirates are unacceptable for Xpert Xpress SARS-CoV-2/FLU/RSV  testing.  Fact Sheet for Patients: PinkCheek.be  Fact Sheet for Healthcare Providers: GravelBags.it  This test is not yet approved or cleared by the [place] FDA and  has been authorized for detection and/or diagnosis of SARS-CoV-2 by  FDA under an Emergency Use Authorization (EUA). This EUA will remain  in effect (meaning this test can be used) for the duration of the  Covid-19 declaration under Section 564(b)(1) of the Act, 21  U.S.C. section 360bbb-3(b)(1), unless the authorization is  terminated or revoked.    Respiratory Syncytial Virus by PCR NEGATIVE NEGATIVE Final    Comment: (NOTE) Fact Sheet for Patients: PinkCheek.be  Fact Sheet for Healthcare Providers: GravelBags.it  This test is not yet approved or cleared by the [place] FDA and  has been authorized for detection and/or diagnosis of SARS-CoV-2 by  FDA under an Emergency Use Authorization (EUA). This EUA will remain  in effect (meaning this test can be used) for the duration of the  COVID-19 declaration under Section 564(b)(1) of the Act, 21 U.S.C.  section 360bbb-3(b)(1), unless the authorization is terminated or  revoked. Performed at [facility], [address] [place], [place] [address]   Urine culture     Status: Abnormal   Collection Time: 08/13/20 10:52 PM   Specimen: Urine, Random  Result Value Ref Range Status   Specimen Description   Final    URINE, RANDOM Performed at  [facility], [address], [place], [place] [address]    Special Requests   Final    NONE Performed at [facility], [address] [place], [place] [address]    Culture >=100,000 COLONIES/mL CITROBACTER FREUNDII (A)  Final   Report Status 08/16/2020 FINAL  Final   Organism ID, Bacteria CITROBACTER FREUNDII (A)  Final      Susceptibility   Citrobacter  freundii - MIC*    CEFAZOLIN RESISTANT Resistant     CEFTRIAXONE <=0.25 SENSITIVE Sensitive     CIPROFLOXACIN <=0.25 SENSITIVE Sensitive     GENTAMICIN <=1 SENSITIVE Sensitive     IMIPENEM <=0.25 SENSITIVE Sensitive     NITROFURANTOIN <=16 SENSITIVE Sensitive     TRIMETH/SULFA <=20 SENSITIVE Sensitive     PIP/TAZO <=4 SENSITIVE Sensitive     * >=100,000 COLONIES/mL CITROBACTER FREUNDII  Body fluid culture     Status: None   Collection Time: 08/14/20 11:36 AM   Specimen: Abdomen; Peritoneal Fluid  Result Value Ref Range Status   Specimen Description   Final    ABDOMEN Performed at [facility], [address] [place], [place] [address]    Special Requests   Final    NONE Performed at [facility], [address] [place], [place] [address]    Gram Stain NO WBC SEEN NO ORGANISMS SEEN   Final   Culture   Final    NO GROWTH 3 DAYS Performed at [facility], [place] [address], [place], [place] [address]    Report Status 08/18/2020 FINAL  Final    Radiology Reports CT Head Wo Contrast  Result Date: 08/13/2020 CLINICAL DATA:  Fall, head injury EXAM: CT HEAD WITHOUT CONTRAST TECHNIQUE: Contiguous axial images were obtained from the base of the skull through the vertex without intravenous contrast. COMPARISON:  None. FINDINGS: Brain: Normal anatomic configuration. Parenchymal volume loss is commensurate with the patient's age. No abnormal intra or extra-axial mass lesion or fluid collection. No abnormal mass effect or midline shift. No evidence of acute intracranial hemorrhage or infarct. Ventricular size is normal. Cerebellum  unremarkable. Vascular: No asymmetric hyperdense vasculature at the skull base. Skull: Intact Sinuses/Orbits: Paranasal sinuses are clear. Orbits are unremarkable. Other: Mastoid air cells and middle ear cavities are clear. IMPRESSION: No acute intracranial injury.  No calvarial fracture. Electronically Signed   By: Fidela Salisbury MD   On: 08/13/2020 22:06   CT CHEST W CONTRAST  Result Date: 08/16/2020 CLINICAL DATA:  Possible pneumonia on abdomen and pelvis CT scan yesterday which was performed for abdominal pain and distension. EXAM: CT CHEST WITH CONTRAST TECHNIQUE: Multidetector CT imaging of the chest was performed during intravenous contrast administration. CONTRAST:  75 mL OMNIPAQUE IOHEXOL 300 MG/ML  SOLN COMPARISON:  CT abdomen and pelvis 08/15/2020. CT chest, abdomen and pelvis 07/07/2018. FINDINGS: Cardiovascular: No significant vascular findings. Normal heart size. No pericardial effusion. Mediastinum/Nodes: No enlarged mediastinal, hilar, or axillary lymph nodes. Thyroid gland, trachea, and esophagus demonstrate no significant findings. Lungs/Pleura: Small bilateral pleural effusions are noted. Small locule of air in the patient's left effusion seen on the patient's CT scan yesterday is no longer present. There is airspace disease in the right lower lobe as seen on yesterday's CT with an appearance most consistent with pneumonia. Upper Abdomen: Cirrhosis, small volume of ascites and changes of portal venous hypertension of ascites again seen. See report of CT scan yesterday. Musculoskeletal: No acute abnormality. Mild, remote superior endplate compression fracture of T8 is seen. The patient also has a remote compression fracture of L1 which is partially imaged. IMPRESSION: Right lower lobe airspace disease has an appearance most consistent with pneumonia and is unchanged since yesterday's exam. Very small bilateral pleural effusions. No air is seen in the left effusion on today's study. Cirrhosis,  small volume of abdominal ascites and portal venous hypertension as seen on CT scan yesterday. Electronically Signed   By: Inge Rise  M.D.   On: 08/16/2020 15:13   CT ABDOMEN PELVIS W CONTRAST  Result Date: 08/15/2020 CLINICAL DATA:  Abdominal distension, pain. Cirrhosis. Paracentesis performed on 08/14/2020 EXAM: CT ABDOMEN AND PELVIS WITH CONTRAST TECHNIQUE: Multidetector CT imaging of the abdomen and pelvis was performed using the standard protocol following bolus administration of intravenous contrast. CONTRAST:  23m OMNIPAQUE IOHEXOL 300 MG/ML  SOLN COMPARISON:  07/07/2020 FINDINGS: Lower chest: Patchy predominantly ground-glass opacity within the right lower lobe is new from prior. Small left pleural effusion with a small amount of air present within the non dependent portion of the collection (series 5, image 11). Heart size within normal limits. No pericardial effusion. Hepatobiliary: Cirrhotic hepatic morphology. No focal liver lesion is identified. There is recanalization of the umbilical vein. Portal vein is well opacified. Multiple gallstones present within the gallbladder. No biliary dilatation. Pancreas: Unremarkable. Spleen: Splenomegaly.  No focal splenic lesion. Adrenals/Urinary Tract: Unremarkable adrenal glands. 12 x 7 mm stone remains present within the right renal pelvis. Mild right-sided hydronephrosis. No left hydronephrosis. Kidneys enhance symmetrically. Urinary bladder is unremarkable. Stomach/Bowel: There is long segment mild circumferential wall thickening predominantly involving the sigmoid and descending colon. No definite pericolonic inflammatory changes, although this evaluation is significantly limited given the presence of abdominopelvic ascites. Similar degree of wall thickening involving the cecum and ascending colon. No dilated loops of small bowel. Stomach within normal limits. Vascular/Lymphatic: Findings of portal hypertension. No acute vascular abnormality is  identified. No abdominopelvic lymphadenopathy. Reproductive: Unremarkable uterus. Stable appearance of bilateral adnexal cysts, previously characterized as benign on MRI from January 2021. Other: Small to moderate volume ascites. No organized abdominopelvic collection or abscess. No pneumoperitoneum. Musculoskeletal: Posttraumatic and postsurgical changes to the left hip and left hemipelvis. Chronic thoracolumbar compression fractures as well as sacral fracture. No new or acute osseous findings. IMPRESSION: 1. New ground-glass opacity within the right lower lobe suspicious for pneumonia. 2. Small left pleural effusion now containing a small amount of air. Etiology is unclear in the absence of recent thoracentesis. Consider CT chest for further evaluation. 3. Interval development of long segment mild wall thickening within the sigmoid and descending colon. No definite pericolonic inflammatory changes, although this evaluation is significantly limited given the presence of abdominopelvic ascites. Findings may represent an infectious or inflammatory colitis versus changes related to portal colopathy. 4. Mild right-sided hydronephrosis. 12 x 7 mm stone remains present within the right renal pelvis. 5. Cirrhosis with evidence of portal hypertension including splenomegaly and small to moderate volume ascites. 6. Cholelithiasis. Electronically Signed   By: NDavina PokeD.O.   On: 08/15/2020 17:44   [place]acentesis  Result Date: 08/14/2020 INDICATION: Ascites secondary to hepatic cirrhosis. Request for diagnostic and therapeutic paracentesis. EXAM: ULTRASOUND GUIDED PARACENTESIS MEDICATIONS: 1% lidocaine 10 mL COMPLICATIONS: None immediate. PROCEDURE: Informed written consent was obtained from the patient after a discussion of the risks, benefits and alternatives to treatment. A timeout was performed prior to the initiation of the procedure. Initial ultrasound scanning demonstrates a moderate amount of ascites  within the left lateral abdomen quadrant. The left lateral abdomen was prepped and draped in the usual sterile fashion. 1% lidocaine was used for local anesthesia. Following this, a 19 gauge, 7-cm, Yueh catheter was introduced. An ultrasound image was saved for documentation purposes. The paracentesis was performed. The catheter was removed and a dressing was applied. The patient tolerated the procedure well without immediate post procedural complication. FINDINGS: A total of approximately 2.2 L of clear yellow fluid was removed.  Samples were sent to the laboratory as requested by the clinical team. IMPRESSION: Successful ultrasound-guided paracentesis yielding 2.2 liters of peritoneal fluid. Read by: Gareth Eagle, PA-C Electronically Signed   By: Marijo Conception M.D.   On: 08/14/2020 12:53   DG Abd Portable 1V  Result Date: 08/15/2020 CLINICAL DATA:  Abdominal pain and distension EXAM: PORTABLE ABDOMEN - 1 VIEW COMPARISON:  CT abdomen and pelvis July 07, 2020 FINDINGS: There is mild dilatation of loops of colon. There is mild wall thickening in the descending colon which may have inflammatory etiology. There is moderate stool in the colon. There is no appreciable air-fluid levels. There are gallstones in the right upper quadrant. There is a calcification in the region of the right renal pelvis measuring 1.2 x 0.8 cm. There is postoperative change in the proximal femur. No evident free air. Visualized lung bases are. IMPRESSION: Question a degree of colitis. No bowel obstruction appreciable. No free air. Gallstones evident in right upper quadrant. Visualized lung bases clear. Calcification in the region of the right renal pelvis measuring 1.2 x 0.8 cm. Obstruction at or near the right ureteropelvic junction from this apparent calculus cannot be excluded by radiography. Electronically Signed   By: Lowella Grip III M.D.   On: 08/15/2020 12:50   DG Foot Complete Left  Result Date: 08/13/2020 CLINICAL  DATA:  Fall, left foot bruising EXAM: LEFT FOOT - COMPLETE 3+ VIEW COMPARISON:  None. 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 There is an acute intra-articular fracture of the probable medial navicular noted dorsally with fracture fragments in near anatomic alignment. Soft tissues are unremarkable. Joint spaces appear preserved. IMPRESSION: Tiny minimally displaced fracture of the probable medial cuneiform dorsally with fracture fragments in anatomic alignment. Electronically Signed   By: Fidela Salisbury MD   On: 08/13/2020 22:31   IR Paracentesis  Result Date: 08/08/2020 INDICATION: Patient with history of NASH cirrhosis, new onset ascites. Request to IR for diagnostic and therapeutic paracentesis. EXAM: ULTRASOUND GUIDED DIAGNOSTIC AND THERAPEUTIC PARACENTESIS MEDICATIONS: 10 mL 1% lidocaine COMPLICATIONS: None immediate. PROCEDURE: Informed written consent was obtained from the patient after a discussion of the risks, benefits and alternatives to treatment. A timeout was performed prior to the initiation of the procedure. Initial ultrasound scanning demonstrates a large amount of ascites within the right lower abdominal quadrant. The right lower abdomen was prepped and draped in the usual sterile fashion. 1% lidocaine was used for local anesthesia. Following this, a 19 gauge, 7-cm, Yueh catheter was introduced. An ultrasound image was saved for documentation purposes. The paracentesis was performed. The catheter was removed and a dressing was applied. The patient tolerated the procedure well without immediate post procedural complication. FINDINGS: A total of approximately 3.3 L of clear, light yellow fluid was removed. Samples were sent to the laboratory as requested by the clinical team. IMPRESSION: Successful ultrasound-guided paracentesis yielding 3.3 liters of peritoneal fluid. Read by Candiss Norse, PA-C  Electronically Signed   By: Corrie Mckusick D.O.   On: 08/08/2020 15:24     Time Spent in minutes  30     Desiree Hane M.D on 08/19/2020 at 3:04 PM  To page go to [email or web address] - password [facility]

## 2020-08-19 NOTE — TOC Progression Note (Signed)
Transition of Care [facility]) - Progression Note    Patient Details  Name: Megan Roberson MRN: [id number] Date of Birth: Mar 13, 1956  Transition of Care [facility]) CM/SW Contact  Salome Arnt, [place] Phone Number: 08/19/2020, 9:05 AM  Clinical Narrative: Pt accepts bed offer at [facility]. Facility notified and will start auth as [name] does not manage. Facility aware pt is not COVID vaccinated. TOC to follow.       Expected Discharge Plan: Skilled Nursing Facility Barriers to Discharge: Continued Medical Work up  Expected Discharge Plan and Services Expected Discharge Plan: [place] In-house Referral: Clinical Social Work Discharge Planning Services: NA Post Acute Care Choice: [place] Living arrangements for the past 2 months: Single Family Home                                       Social Determinants of Health (SDOH) Interventions    Readmission Risk Interventions Readmission Risk Prevention Plan 08/14/2020  Transportation Screening Complete  PCP or Specialist Appt within 3-5 Days Not Complete  Not Complete comments Anticipating discharge to SNF  [place] or [place] Complete  Social Work Consult for [place] Planning/Counseling Complete  Palliative Care Screening Not Applicable  Medication Review Press photographer) Complete  Some recent data might be hidden

## 2020-08-19 NOTE — Progress Notes (Signed)
    Subjective: Nausea improved. No vomiting. Postprandial nausea intermittently. No abdominal pain. No overt GI bleeding. No confusion. Appetite poor.   Objective: Vital signs in last 24 hours: Temp:  [97.7 F (36.5 C)-98.9 F (37.2 C)] 97.7 F (36.5 C) (10/18 0453) Pulse Rate:  [75-82] 75 (10/18 0453) Resp:  [16-20] 16 (10/18 0453) BP: (118-131)/(72-79) 131/79 (10/18 0453) SpO2:  [98 %-99 %] 99 % (10/18 0453) Last BM Date: 08/17/20 General:   Alert and oriented, pleasant, chronically ill-appearing. Sallow complexion.  Abdomen:  Bowel sounds present, round but soft. Small umbilical hernia Msk:  Symmetrical without gross deformities. Normal posture. Extremities:  Without  edema. Neurologic:  Alert and  oriented x4;  Negative asterixis  Psych:  Alert and cooperative. Normal mood and affect.  Intake/Output from previous day: 10/17 0701 - 10/18 0700 In: 800 [P.O.:800] Out: 850 [Urine:850] Intake/Output this shift: No intake/output data recorded.  Lab Results: Recent Labs    08/17/20 0636 08/18/20 0607 08/19/20 0552  WBC 7.7 7.9 5.7  HGB 8.4* 8.3* 8.2*  HCT 28.1* 27.8* 28.2*  PLT 51* 50* 48*   BMET Recent Labs    08/17/20 0636 08/18/20 0607 08/19/20 0552  NA 131* 132* 134*  K 2.7* 3.0* 3.5  CL 97* 103 99  CO2 23 23 24   GLUCOSE 113* 105* 106*  BUN 13 13 12   CREATININE 0.58 0.55 0.53  CALCIUM 8.2* 8.3* 8.5*   LFT Recent Labs    08/17/20 0636 08/18/20 0607 08/19/20 0552  PROT 6.0* 5.9* 5.8*  ALBUMIN 2.9* 2.8* 2.8*  AST 34 31 28  ALT 23 22 20   ALKPHOS 98 95 91  BILITOT 3.0* 2.3* 2.2*   Lab Results  Component Value Date   INR 1.4 (H) 08/14/2020   INR 1.3 (H) 08/13/2020   INR 1.9 (H) 05/19/2020    Assessment: 64 year old female with history of NASH cirrhosis, non-compliance with lactulose and confusion with diuretic therapy as outpatient, presenting with decompensated cirrhosis characterized by profound ascites and encephalopathy. She also was found  to have acute fracture of left foot.   Ascites: s/p para with 2.2 liters removed on 10/13. Negative SBP. Diuretic regimen consists of Lasix 40 mg and aldactone 150 mg daily.   Encephalopathy: resolved. No asterixis. Remains on lactulose and Xifaxan.  Cirrhosis: due for surveillance endoscopy as outpatient. 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 Check INR to calculate MELD.   Chronic nausea but without vomiting and at baseline. Continue supportive measures.   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 Further evaluation as outpatient with primary GI.   Will be going to [place] for skilled nursing. We will sign off; keep appt already arranged with Dr. Havery Moros 11/12 at 1:20.     Plan: Follow-up with Dr. Havery Moros Nov 12th as already arranged  Continue diuretic regimen: will need close monitoring of electrolytes  Continue lactulose and Xifaxan  Checking INR today to calculate MELD.   Will sign off.    Megan Needs, PhD, ANP-BC [facility]     LOS: 6 days    08/19/2020, 8:01 AM

## 2020-08-20 ENCOUNTER — Ambulatory Visit: Payer: Medicare HMO | Admitting: Family Medicine

## 2020-08-20 DIAGNOSIS — F39 Unspecified mood [affective] disorder: Secondary | ICD-10-CM

## 2020-08-20 LAB — CBC WITH DIFFERENTIAL/PLATELET
Abs Immature Granulocytes: 0.11 10*3/uL — ABNORMAL HIGH (ref 0.00–0.07)
Basophils Absolute: 0 10*3/uL (ref 0.0–0.1)
Basophils Relative: 1 %
Eosinophils Absolute: 0.3 10*3/uL (ref 0.0–0.5)
Eosinophils Relative: 6 %
HCT: 29.3 % — ABNORMAL LOW (ref 36.0–46.0)
Hemoglobin: 8.6 g/dL — ABNORMAL LOW (ref 12.0–15.0)
Immature Granulocytes: 2 %
Lymphocytes Relative: 12 %
Lymphs Abs: 0.6 10*3/uL — ABNORMAL LOW (ref 0.7–4.0)
MCH: 25.5 pg — ABNORMAL LOW (ref 26.0–34.0)
MCHC: 29.4 g/dL — ABNORMAL LOW (ref 30.0–36.0)
MCV: 86.9 fL (ref 80.0–100.0)
Monocytes Absolute: 0.7 10*3/uL (ref 0.1–1.0)
Monocytes Relative: 14 %
Neutro Abs: 3 10*3/uL (ref 1.7–7.7)
Neutrophils Relative %: 65 %
Platelets: 49 10*3/uL — ABNORMAL LOW (ref 150–400)
RBC: 3.37 MIL/uL — ABNORMAL LOW (ref 3.87–5.11)
RDW: 19 % — ABNORMAL HIGH (ref 11.5–15.5)
WBC: 4.7 10*3/uL (ref 4.0–10.5)
nRBC: 0 % (ref 0.0–0.2)

## 2020-08-20 LAB — MAGNESIUM: Magnesium: 1.8 mg/dL (ref 1.7–2.4)

## 2020-08-20 NOTE — TOC Progression Note (Signed)
Transition of Care [facility]) - Progression Note   Patient Details  Name: Megan Roberson MRN: [id number] Date of Birth: May 15, 1956  Transition of Care [facility]) CM/SW [place], LCSW Phone Number: 08/20/2020, 3:08 PM  Clinical Narrative: CSW notified Debbie with [place] that a new PT was completed for insurance. TOC awaiting insurance authorization.  Expected Discharge Plan: Skilled Nursing Facility Barriers to Discharge: Insurance Authorization  Expected Discharge Plan and Services Expected Discharge Plan: [place] In-house Referral: Clinical Social Work Discharge Planning Services: NA Post Acute Care Choice: [place] Living arrangements for the past 2 months: Single Family Home  Readmission Risk Interventions Readmission Risk Prevention Plan 08/14/2020  Transportation Screening Complete  PCP or Specialist Appt within 3-5 Days Not Complete  Not Complete comments Anticipating discharge to SNF  [place] or Home Care Consult Complete  Social Work Consult for [place] Planning/Counseling Complete  Palliative Care Screening Not Applicable  Medication Review Press photographer) Complete  Some recent data might be hidden

## 2020-08-20 NOTE — Progress Notes (Signed)
Physical Therapy Treatment Patient Details Name: Megan Roberson MRN: [id number] DOB: August 26, 1956 Today's Date: 08/20/2020    History of Present Illness Megan Roberson is a 64 y.o. female with medical history significant for  pancytopenia, cirrhosis of the liver, anemia, hypothyroidism, GERD and insomnia and multiple fractures who presents to the Emergency Department  due to generalized weakness, nausea and left foot pain.  She complained of decreased appetite since last 2 months with loss of 20 pounds and about 1 month of increasing and worsening generalized weakness which has progressed to inability to walk with a walker despite assistance by her husband within last several days.  She endorsed history of multiple falls with last fall being last week and worsening left foot pain since the fall.  Patient states that she has not been compliant with her lactulose due to nausea, she denies vomiting, fevers, headache, chest pain, shortness of breath.  She had a recent paracentesis on 10/7 during which 3.3 L of clear, light yellow fluid was removed.  Patient states that she only takes Aldactone and that she was asked by her GI to stop taking Lasix due to low potassium when on Lasix.    PT Comments    Pt agreeable to therapy and requests to transfer to chair and out of bed. Pt requests to transfer to [facility] for bowel movement, requiring assistance with pericare while standing with RW for ~15 seconds for therapist to assist. Pt fatigues with transfers and declines therapeutic exercises. Therapist provides RLE blocking with transfers and steps to avoid foot from sliding out from under pt. Pt maintains flexed trunked with heavy BUE on RW for steadying and avoids LLE WB with all mobility. Pt on RA with SpO2 99% with mild SOB noted. Pt tolerates remaining up in chair with pillow under BLE for comfort per pt request, chair alarm on and call bell in lap; encouraged to sit up as long as tolerable and pt verbalizes  understanding. Pt will benefit from continued physical therapy in hospital and recommendations below to increase strength, balance, endurance for safe ADLs and gait.     Follow Up Recommendations  SNF     Equipment Recommendations  None recommended by PT    Recommendations for Other Services       Precautions / Restrictions Precautions Precautions: Fall Precaution Comments: L ankle splint Restrictions Weight Bearing Restrictions: No    Mobility  Bed Mobility Overal bed mobility: Needs Assistance Bed Mobility: Supine to Sit  Supine to sit: Min guard;HOB elevated  General bed mobility comments: slow, labored movement requiring increased time and cues for hand placement, elevated HOB with use of bedrail  Transfers Overall transfer level: Needs assistance Equipment used: None;Rolling walker (2 wheeled) Transfers: Sit to/from [facility] Sit to Stand: Mod assist;From elevated surface Stand pivot transfers: Mod assist  General transfer comment: mod A to stand from elevated bed and pivot to [facility] with therapist infront of pt blocking RLE from sliding; BSC>EOB with RW and mod A with constant cues for hand placement; mod A EOB>chair with cues for hand placement; LLE dangled and pt avoids WB for transfers  Ambulation/Gait Ambulation/Gait assistance: Mod assist;Max assist  Assistive device: Rolling walker (2 wheeled) Gait Pattern/deviations: Shuffle  General Gait Details: pt limited to a few sidesteps with heavy dependence on RW for UE support and sliding RLE laterally, pt avoids LLE WB with steps   Chief Strategy Officer  Modified Rankin (Stroke Patients Only)       Balance Overall balance assessment: Needs assistance;History of Falls Sitting-balance support: Feet supported;Single extremity supported Sitting balance-Leahy Scale: Fair Sitting balance - Comments: seated EOB   Standing balance support: During functional  activity;Bilateral upper extremity supported Standing balance-Leahy Scale: Poor Standing balance comment: mod A with static standing and RW       Cognition Arousal/Alertness: Awake/alert Behavior During Therapy: WFL for tasks assessed/performed;Flat affect Overall Cognitive Status: Within Functional Limits for tasks assessed       Exercises      General Comments General comments (skin integrity, edema, etc.): on RA with SpO2 99% with mobility      Pertinent Vitals/Pain Pain Assessment: Faces Faces Pain Scale: Hurts little more Pain Location: abdomen Pain Descriptors / Indicators: Cramping Pain Intervention(s): Limited activity within patient's tolerance;Monitored during session;Repositioned    Home Living                      Prior Function            PT Goals (current goals can now be found in the care plan section) Acute Rehab PT Goals Patient Stated Goal: return home after rehab PT Goal Formulation: With patient Time For Goal Achievement: 08/29/20 Potential to Achieve Goals: Good Progress towards PT goals: Progressing toward goals    Frequency    Min 3X/week      PT Plan Current plan remains appropriate    Co-evaluation              AM-PAC PT "6 Clicks" Mobility   Outcome Measure  Help needed turning from your back to your side while in a flat bed without using bedrails?: None Help needed moving from lying on your back to sitting on the side of a flat bed without using bedrails?: A Little Help needed moving to and from a bed to a chair (including a wheelchair)?: A Lot Help needed standing up from a chair using your arms (e.g., wheelchair or bedside chair)?: A Lot Help needed to walk in hospital room?: A Lot Help needed climbing 3-5 steps with a railing? : Total 6 Click Score: 14    End of Session   Activity Tolerance: Patient tolerated treatment well;Patient limited by fatigue Patient left: in chair;with call bell/phone within  reach;with chair alarm set Nurse Communication: Mobility status PT Visit Diagnosis: Unsteadiness on feet (R26.81);Other abnormalities of gait and mobility (R26.89);Muscle weakness (generalized) (M62.81)     Time: 0177-9390 PT Time Calculation (min) (ACUTE ONLY): 22 min  Charges:  $Therapeutic Activity: 8-22 mins                      Tori Shereka Lafortune PT, DPT 08/20/20, 10:53 AM [phone]

## 2020-08-20 NOTE — Progress Notes (Signed)
[facility]  PROGRESS NOTE  Megan Roberson YJE:[id number] DOB: 13-Nov-1955 DOA: 08/13/2020 PCP: Celene Squibb, MD Admit date - 08/13/2020   Admitting Physician Bernadette Hoit, DO  Outpatient Primary MD for the patient is Celene Squibb, MD  LOS - 7 Brief Narrative   Megan Roberson is a 64 y.o. year old female with medical history significant for Pancytopenia, cirrhosis of liver, hypothyroidism, GERD who presents on 08/13/2020 with generalized weakness, nausea, recurrent falls and left foot pain and was found to have profound ascites, hepatic encephalopathy, and acute fracture of medial cuneiform of the left foot.  Patient was started on ceftriaxone due to concern for UTI given positive UA but patient denies any urinary symptoms.  Patient underwent IR guided paracentesis on 10/13 with removal of 2 L of ascitic fluid.  Of note, underwent outpatient IR paracentesis with 3.3 L removed on 10/7  Hospital course complicated by refractory, quick accumulating ascites presume related to poor adherence to diuretic therapy.  GI was consulted on 10/14 to further assist.  CT abdomen now shows concern for potential right-sided pneumonia as well as questionable inflammatory component of: Induration to left pleural effusion with possible air bubble.  CT chest confirmed what seemed to be pneumonia though patient still only have mild cough no hypoxia.  Procalcitonin was unremarkable so IV doxycycline/ceftriaxone discontinued on 10/18 patient did receive IV albumin as directed by GI on 10/14 given refractory ascites   Subjective  Today she has no abdominal pain, no nausea or vomiting.  No fevers or chills.  No cough.  No shortness of breath  A & P   Decompensated cirrhosis with hepatic encephalopathy and ascites in the setting of nonadherence with lactulose therapy due to nausea/poor taste as well as diuretics.  Mental status is appropriate, only has mild ascites.  Status post paracentesis with 2.2 L removed on  10/13.  Ascitic fluid not consistent with SBP.  Status post IV albumin on 10/14 for refractory ascites Denies any hematemesis or melena.  No asterixis on exam -continue lactulose, rifaximin -Continue home spironolactone 150 mg daily, resume Lasix at 40 mg -Appreciate GI recommendations, -Continue outpatient appointment arranged with Dr. Havery Moros on 11/12  Abdominal pain likely multifactorial including recurrent ascites as well as concern for potential colitis, stable. CT abdomen mild wall thickening of the colon that could represent infectious or inflammatory colitis versus changes related to portal colopathy.  Aside from tenderness on the right side of her abdomen she has no diarrhea, no fevers, no leukocytosis -Continue to address ascites mentioned above, and supportive care -Do not think this warrants antibiotic therapy currently  New groundglass opacity concerning for possible pneumonia and small left pleural effusion containing a small amount of air per CT abdomen CT chest shows no air, dimension potential groundglass opacity concerning for pneumonia, patient remains stable on room air, no shortness of breath only mild cough.  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  Iron panel seems more consistent with iron deficiency.  Not having any active signs of bleeding -Closely monitor CBC, transfuse hemoglobin less than 7, platelets less than 10,000  Generalized weakness with recurrent falls, high concern for failure to thrive Suspect general failure to thrive in setting of chronic liver disease, B12 within normal limits, TSH slightly elevated at 6.5, thiamine deficiency also possibility, electrolytes all within normal limits -  Check  B1 -Continue home Synthroid -PT eval given left foot fracture--recommend SNF for rehab -Dietary consulted, protein supplementation  Moderate malnutrition, related to chronic illness.  As evident by moderate fat/muscle depletion -Appreciate nutrition recommendations -We will continue Ensure p.o. twice daily as well as supplementation  Abnormal UA.  Denies any urinary symptoms , not likely UTI -IV ceftriaxone discontinued  GERD, stable -Continue oral PPI  Mood disorder, stable -Continue home Zoloft  Left minimally displaced medial cuneiform foot fracture secondary to fall Cast placed in the ED, outpatient orthopedic follow-up -Oxycodone 5 mg every 8 hours for moderate/severe pain   Family Communication  : None at bedside, will update husband per patient request  Code Status : Full  Disposition Plan  :  Patient is from home. Anticipated d/c date:  Medically stable for discharge. Barriers to d/c or necessity for inpatient status:  Awaiting insurance authorization for rehab/SNF Consults  : Gastroenterology  Procedures  : IR guided paracentesis, 10/13  DVT Prophylaxis  : SCDs MDM: The below labs and imaging reports were reviewed and summarized above.  Medication management as above.  Lab Results  Component Value Date   PLT 49 (L) 08/20/2020    Diet :  Diet Order            Diet 2 gram sodium Room service appropriate? Yes; Fluid consistency: Thin  Diet effective now                  Inpatient Medications Scheduled Meds: . feeding supplement  237 mL Oral BID BM  . furosemide  40 mg Oral Daily  . lactulose  20 g Oral TID  . levothyroxine  75 mcg Oral QAC breakfast  . pantoprazole  40 mg Oral Daily  . prochlorperazine  5 mg Oral Q8H  . rifaximin  550 mg Oral BID  . sertraline  50 mg Oral Daily  . spironolactone  150 mg Oral Daily   Continuous Infusions:  PRN Meds:.diclofenac Sodium, morphine injection, ondansetron, oxyCODONE, traZODone  Antibiotics  :    Anti-infectives (From admission, onward)   Start     Dose/Rate Route Frequency Ordered Stop   08/16/20 1730  doxycycline (VIBRAMYCIN) 100 mg in sodium chloride 0.9 % 250 mL IVPB  Status:  Discontinued        100 mg 125 mL/hr over 120 Minutes Intravenous Every 12 hours 08/16/20 1616 08/19/20 0808   08/14/20 2359  cefTRIAXone (ROCEPHIN) 1 g in sodium chloride 0.9 % 100 mL IVPB  Status:  Discontinued        1 g 200 mL/hr over 30 Minutes Intravenous Every 24 hours 08/14/20 0120 08/19/20 0808   08/14/20 1000  rifaximin (XIFAXAN) tablet 550 mg        550 mg Oral 2 times daily 08/14/20 0834     08/13/20 2300  cefTRIAXone (ROCEPHIN) 1 g in sodium chloride 0.9 % 100 mL IVPB        1 g 200 mL/hr over 30 Minutes Intravenous  Once 08/13/20 2257 08/14/20 0026       Objective   Vitals:   08/19/20 2105 08/20/20 0411 08/20/20 0521 08/20/20 1349  BP: 98/82 114/69 105/63 114/68  Pulse: 88 79 73 83  Resp: 17 20 18 18   Temp: 98.6 F (37 C) 98.2 F (36.8 C) 98.5 F (36.9 C) 99 F (37.2 C)  TempSrc: Oral Oral  Oral  SpO2: 99% 99% 97% 98%  Weight:      Height:  SpO2: 98 % O2 Flow Rate (L/min): 0 L/min FiO2 (%): 21 %  Wt Readings from Last 3 Encounters:  08/14/20 57 kg  08/06/20 54.9 kg  06/04/20 63.5 kg     Intake/Output Summary (Last 24 hours) at 08/20/2020 1532 Last data filed at 08/20/2020 0853 Gross per 24 hour  Intake 240 ml  Output --  Net 240 ml    Physical Exam:     Awake Alert, Oriented to self, place, context, no asterixis Thin, frail No new F.N deficits,  [place].AT, Normal respiratory effort on room air, CTAB RRR,No Gallops,Rubs or new Murmurs, no peripheral edema Diminished abdominal sounds, soft,  distended, without tenderness Bruising along left toes   I have personally reviewed the following:   Data Reviewed:  CBC Recent Labs  Lab 08/16/20 0613 08/17/20 0636 08/18/20 0607 08/19/20 0552 08/20/20 0602  WBC 6.9 7.7 7.9 5.7 4.7  HGB 8.2* 8.4*  8.3* 8.2* 8.6*  HCT 26.9* 28.1* 27.8* 28.2* 29.3*  PLT 49* 51* 50* 48* 49*  MCV 83.8 85.7 86.3 87.6 86.9  MCH 25.5* 25.6* 25.8* 25.5* 25.5*  MCHC 30.5 29.9* 29.9* 29.1* 29.4*  RDW 19.1* 19.1* 19.1* 19.0* 19.0*  LYMPHSABS 0.8 0.6* 0.8 0.6* 0.6*  MONOABS 0.9 1.2* 1.1* 0.8 0.7  EOSABS 0.2 0.2 0.3 0.4 0.3  BASOSABS 0.0 0.0 0.0 0.0 0.0    Chemistries  Recent Labs  Lab 08/14/20 0621 08/15/20 0616 08/16/20 0613 08/17/20 0636 08/18/20 0607 08/19/20 0552 08/20/20 0602  NA  --  132* 129* 131* 132* 134*  --   K  --  3.5 3.0* 2.7* 3.0* 3.5  --   CL  --  103 99 97* 103 99  --   CO2  --  22 22 23 23 24   --   GLUCOSE  --  83 109* 113* 105* 106*  --   BUN  --  14 13 13 13 12   --   CREATININE  --  0.53 0.57 0.58 0.55 0.53  --   CALCIUM  --  8.1* 8.0* 8.2* 8.3* 8.5*  --   MG   < >  --  1.9 1.9 1.8 1.8 1.8  AST  --  44* 42* 34 31 28  --   ALT  --  24 25 23 22 20   --   ALKPHOS  --  105 104 98 95 91  --   BILITOT  --  2.0* 2.8* 3.0* 2.3* 2.2*  --    < > = values in this interval not displayed.   ------------------------------------------------------------------------------------------------------------------ No results for input(s): CHOL, HDL, LDLCALC, TRIG, CHOLHDL, LDLDIRECT in the last 72 hours.  No results found for: HGBA1C ------------------------------------------------------------------------------------------------------------------ No results for input(s): TSH, T4TOTAL, T3FREE, THYROIDAB in the last 72 hours.  Invalid input(s): FREET3 ------------------------------------------------------------------------------------------------------------------ No results for input(s): VITAMINB12, FOLATE, FERRITIN, TIBC, IRON, RETICCTPCT in the last 72 hours.  Coagulation profile Recent Labs  Lab 08/13/20 1815 08/14/20 0621 08/19/20 1142  INR 1.3* 1.4* 1.5*    No results for input(s): DDIMER in the last 72 hours.  Cardiac Enzymes No results for input(s): CKMB, TROPONINI, MYOGLOBIN  in the last 168 hours.  Invalid input(s): CK ------------------------------------------------------------------------------------------------------------------    Component Value Date/Time   BNP 114.0 (H) 05/18/2020 2209    Micro Results Recent Results (from the past 240 hour(s))  Resp Panel by RT PCR (RSV, Flu A&B, Covid) - Nasopharyngeal Swab     Status: None   Collection Time: 08/13/20  9:42 PM   Specimen:  Nasopharyngeal Swab  Result Value Ref Range Status   SARS Coronavirus 2 by RT PCR NEGATIVE NEGATIVE Final    Comment: (NOTE) SARS-CoV-2 target nucleic acids are NOT DETECTED.  The SARS-CoV-2 RNA is generally detectable in upper respiratoy specimens during the acute phase of infection. The lowest concentration of SARS-CoV-2 viral copies this assay can detect is 131 copies/mL. A negative result does not preclude SARS-Cov-2 infection and should not be used as the sole basis for treatment or other patient management decisions. A negative result may occur with  improper specimen collection/handling, submission of specimen other than nasopharyngeal swab, presence of viral mutation(s) within the areas targeted by this assay, and inadequate number of viral copies (<131 copies/mL). A negative result must be combined with clinical observations, patient history, and epidemiological information. The expected result is Negative.  Fact Sheet for Patients:  PinkCheek.be  Fact Sheet for Healthcare Providers:  GravelBags.it  This test is no t yet approved or cleared by the [place] FDA and  has been authorized for detection and/or diagnosis of SARS-CoV-2 by FDA under an Emergency Use Authorization (EUA). This EUA will remain  in effect (meaning this test can be used) for the duration of the COVID-19 declaration under Section 564(b)(1) of the Act, 21 U.S.C. section 360bbb-3(b)(1), unless the authorization is terminated  or revoked sooner.     Influenza A by PCR NEGATIVE NEGATIVE Final   Influenza B by PCR NEGATIVE NEGATIVE Final    Comment: (NOTE) The Xpert Xpress SARS-CoV-2/FLU/RSV assay is intended as an aid in  the diagnosis of influenza from Nasopharyngeal swab specimens and  should not be used as a sole basis for treatment. Nasal washings and  aspirates are unacceptable for Xpert Xpress SARS-CoV-2/FLU/RSV  testing.  Fact Sheet for Patients: PinkCheek.be  Fact Sheet for Healthcare Providers: GravelBags.it  This test is not yet approved or cleared by the [place] FDA and  has been authorized for detection and/or diagnosis of SARS-CoV-2 by  FDA under an Emergency Use Authorization (EUA). This EUA will remain  in effect (meaning this test can be used) for the duration of the  Covid-19 declaration under Section 564(b)(1) of the Act, 21  U.S.C. section 360bbb-3(b)(1), unless the authorization is  terminated or revoked.    Respiratory Syncytial Virus by PCR NEGATIVE NEGATIVE Final    Comment: (NOTE) Fact Sheet for Patients: PinkCheek.be  Fact Sheet for Healthcare Providers: GravelBags.it  This test is not yet approved or cleared by the [place] FDA and  has been authorized for detection and/or diagnosis of SARS-CoV-2 by  FDA under an Emergency Use Authorization (EUA). This EUA will remain  in effect (meaning this test can be used) for the duration of the  COVID-19 declaration under Section 564(b)(1) of the Act, 21 U.S.C.  section 360bbb-3(b)(1), unless the authorization is terminated or  revoked. Performed at [facility], [address], [place], [place] [address]   Urine culture     Status: Abnormal   Collection Time: 08/13/20 10:52 PM   Specimen: Urine, Random  Result Value Ref Range Status   Specimen Description   Final    URINE, RANDOM Performed at [facility], [address] [place], [place] [address]    Special Requests   Final    NONE Performed at [facility], [address] [place], [place] [address]    Culture >=100,000 COLONIES/mL CITROBACTER FREUNDII (A)  Final   Report Status 08/16/2020 FINAL  Final   Organism ID, Bacteria CITROBACTER FREUNDII (  A)  Final      Susceptibility   Citrobacter freundii - MIC*    CEFAZOLIN RESISTANT Resistant     CEFTRIAXONE <=0.25 SENSITIVE Sensitive     CIPROFLOXACIN <=0.25 SENSITIVE Sensitive     GENTAMICIN <=1 SENSITIVE Sensitive     IMIPENEM <=0.25 SENSITIVE Sensitive     NITROFURANTOIN <=16 SENSITIVE Sensitive     TRIMETH/SULFA <=20 SENSITIVE Sensitive     PIP/TAZO <=4 SENSITIVE Sensitive     * >=100,000 COLONIES/mL CITROBACTER FREUNDII  Body fluid culture     Status: None   Collection Time: 08/14/20 11:36 AM   Specimen: Abdomen; Peritoneal Fluid  Result Value Ref Range Status   Specimen Description   Final    ABDOMEN Performed at [facility], [address] [place], [place] [address]    Special Requests   Final    NONE Performed at [facility], [address] [place], [place] [address]    Gram Stain NO WBC SEEN NO ORGANISMS SEEN   Final   Culture   Final    NO GROWTH 3 DAYS Performed at [facility], [place] [address] [place],  [address]    Report Status 08/18/2020 FINAL  Final    Radiology Reports CT Head Wo Contrast  Result Date: 08/13/2020 CLINICAL DATA:  Fall, head injury EXAM: CT HEAD WITHOUT CONTRAST TECHNIQUE: Contiguous axial images were obtained from the base of the skull through the vertex without intravenous contrast. COMPARISON:  None. FINDINGS: Brain: Normal anatomic configuration. Parenchymal volume loss is commensurate with the patient's age. No abnormal intra or extra-axial mass lesion or fluid collection. No abnormal mass effect or midline shift. No evidence of acute intracranial hemorrhage or infarct. Ventricular size is normal. Cerebellum  unremarkable. Vascular: No asymmetric hyperdense vasculature at the skull base. Skull: Intact Sinuses/Orbits: Paranasal sinuses are clear. Orbits are unremarkable. Other: Mastoid air cells and middle ear cavities are clear. IMPRESSION: No acute intracranial injury.  No calvarial fracture. Electronically Signed   By: Fidela Salisbury MD   On: 08/13/2020 22:06   CT CHEST W CONTRAST  Result Date: 08/16/2020 CLINICAL DATA:  Possible pneumonia on abdomen and pelvis CT scan yesterday which was performed for abdominal pain and distension. EXAM: CT CHEST WITH CONTRAST TECHNIQUE: Multidetector CT imaging of the chest was performed during intravenous contrast administration. CONTRAST:  75 mL OMNIPAQUE IOHEXOL 300 MG/ML  SOLN COMPARISON:  CT abdomen and pelvis 08/15/2020. CT chest, abdomen and pelvis 07/07/2018. FINDINGS: Cardiovascular: No significant vascular findings. Normal heart size. No pericardial effusion. Mediastinum/Nodes: No enlarged mediastinal, hilar, or axillary lymph nodes. Thyroid gland, trachea, and esophagus demonstrate no significant findings. Lungs/Pleura: Small bilateral pleural effusions are noted. Small locule of air in the patient's left effusion seen on the patient's CT scan yesterday is no longer present. There is airspace disease in the right lower lobe as seen on yesterday's CT with an appearance most consistent with pneumonia. Upper Abdomen: Cirrhosis, small volume of ascites and changes of portal venous hypertension of ascites again seen. See report of CT scan yesterday. Musculoskeletal: No acute abnormality. Mild, remote superior endplate compression fracture of T8 is seen. The patient also has a remote compression fracture of L1 which is partially imaged. IMPRESSION: Right lower lobe airspace disease has an appearance most consistent with pneumonia and is unchanged since yesterday's exam. Very small bilateral pleural effusions. No air is seen in the left effusion on today's study. Cirrhosis,  small volume of abdominal ascites and portal venous hypertension as seen  on CT scan yesterday. Electronically Signed   By: Inge Rise M.D.   On: 08/16/2020 15:13   CT ABDOMEN PELVIS W CONTRAST  Result Date: 08/15/2020 CLINICAL DATA:  Abdominal distension, pain. Cirrhosis. Paracentesis performed on 08/14/2020 EXAM: CT ABDOMEN AND PELVIS WITH CONTRAST TECHNIQUE: Multidetector CT imaging of the abdomen and pelvis was performed using the standard protocol following bolus administration of intravenous contrast. CONTRAST:  55m OMNIPAQUE IOHEXOL 300 MG/ML  SOLN COMPARISON:  07/07/2020 FINDINGS: Lower chest: Patchy predominantly ground-glass opacity within the right lower lobe is new from prior. Small left pleural effusion with a small amount of air present within the non dependent portion of the collection (series 5, image 11). Heart size within normal limits. No pericardial effusion. Hepatobiliary: Cirrhotic hepatic morphology. No focal liver lesion is identified. There is recanalization of the umbilical vein. Portal vein is well opacified. Multiple gallstones present within the gallbladder. No biliary dilatation. Pancreas: Unremarkable. Spleen: Splenomegaly.  No focal splenic lesion. Adrenals/Urinary Tract: Unremarkable adrenal glands. 12 x 7 mm stone remains present within the right renal pelvis. Mild right-sided hydronephrosis. No left hydronephrosis. Kidneys enhance symmetrically. Urinary bladder is unremarkable. Stomach/Bowel: There is long segment mild circumferential wall thickening predominantly involving the sigmoid and descending colon. No definite pericolonic inflammatory changes, although this evaluation is significantly limited given the presence of abdominopelvic ascites. Similar degree of wall thickening involving the cecum and ascending colon. No dilated loops of small bowel. Stomach within normal limits. Vascular/Lymphatic: Findings of portal hypertension. No acute vascular abnormality is  identified. No abdominopelvic lymphadenopathy. Reproductive: Unremarkable uterus. Stable appearance of bilateral adnexal cysts, previously characterized as benign on MRI from January 2021. Other: Small to moderate volume ascites. No organized abdominopelvic collection or abscess. No pneumoperitoneum. Musculoskeletal: Posttraumatic and postsurgical changes to the left hip and left hemipelvis. Chronic thoracolumbar compression fractures as well as sacral fracture. No new or acute osseous findings. IMPRESSION: 1. New ground-glass opacity within the right lower lobe suspicious for pneumonia. 2. Small left pleural effusion now containing a small amount of air. Etiology is unclear in the absence of recent thoracentesis. Consider CT chest for further evaluation. 3. Interval development of long segment mild wall thickening within the sigmoid and descending colon. No definite pericolonic inflammatory changes, although this evaluation is significantly limited given the presence of abdominopelvic ascites. Findings may represent an infectious or inflammatory colitis versus changes related to portal colopathy. 4. Mild right-sided hydronephrosis. 12 x 7 mm stone remains present within the right renal pelvis. 5. Cirrhosis with evidence of portal hypertension including splenomegaly and small to moderate volume ascites. 6. Cholelithiasis. Electronically Signed   By: NDavina PokeD.O.   On: 08/15/2020 17:44   [place]acentesis  Result Date: 08/14/2020 INDICATION: Ascites secondary to hepatic cirrhosis. Request for diagnostic and therapeutic paracentesis. EXAM: ULTRASOUND GUIDED PARACENTESIS MEDICATIONS: 1% lidocaine 10 mL COMPLICATIONS: None immediate. PROCEDURE: Informed written consent was obtained from the patient after a discussion of the risks, benefits and alternatives to treatment. A timeout was performed prior to the initiation of the procedure. Initial ultrasound scanning demonstrates a moderate amount of ascites  within the left lateral abdomen quadrant. The left lateral abdomen was prepped and draped in the usual sterile fashion. 1% lidocaine was used for local anesthesia. Following this, a 19 gauge, 7-cm, Yueh catheter was introduced. An ultrasound image was saved for documentation purposes. The paracentesis was performed. The catheter was removed and a dressing was applied. The patient tolerated the procedure well without immediate post procedural complication. FINDINGS:  A total of approximately 2.2 L of clear yellow fluid was removed. Samples were sent to the laboratory as requested by the clinical team. IMPRESSION: Successful ultrasound-guided paracentesis yielding 2.2 liters of peritoneal fluid. Read by: Gareth Eagle, PA-C Electronically Signed   By: Marijo Conception M.D.   On: 08/14/2020 12:53   DG Abd Portable 1V  Result Date: 08/15/2020 CLINICAL DATA:  Abdominal pain and distension EXAM: PORTABLE ABDOMEN - 1 VIEW COMPARISON:  CT abdomen and pelvis July 07, 2020 FINDINGS: There is mild dilatation of loops of colon. There is mild wall thickening in the descending colon which may have inflammatory etiology. There is moderate stool in the colon. There is no appreciable air-fluid levels. There are gallstones in the right upper quadrant. There is a calcification in the region of the right renal pelvis measuring 1.2 x 0.8 cm. There is postoperative change in the proximal femur. No evident free air. Visualized lung bases are. IMPRESSION: Question a degree of colitis. No bowel obstruction appreciable. No free air. Gallstones evident in right upper quadrant. Visualized lung bases clear. Calcification in the region of the right renal pelvis measuring 1.2 x 0.8 cm. Obstruction at or near the right ureteropelvic junction from this apparent calculus cannot be excluded by radiography. Electronically Signed   By: Lowella Grip III M.D.   On: 08/15/2020 12:50   DG Foot Complete Left  Result Date: 08/13/2020 CLINICAL  DATA:  Fall, left foot bruising EXAM: LEFT FOOT - COMPLETE 3+ VIEW COMPARISON:  None. 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 There is an acute intra-articular fracture of the probable medial navicular noted dorsally with fracture fragments in near anatomic alignment. Soft tissues are unremarkable. Joint spaces appear preserved. IMPRESSION: Tiny minimally displaced fracture of the probable medial cuneiform dorsally with fracture fragments in anatomic alignment. Electronically Signed   By: Fidela Salisbury MD   On: 08/13/2020 22:31   IR Paracentesis  Result Date: 08/08/2020 INDICATION: Patient with history of NASH cirrhosis, new onset ascites. Request to IR for diagnostic and therapeutic paracentesis. EXAM: ULTRASOUND GUIDED DIAGNOSTIC AND THERAPEUTIC PARACENTESIS MEDICATIONS: 10 mL 1% lidocaine COMPLICATIONS: None immediate. PROCEDURE: Informed written consent was obtained from the patient after a discussion of the risks, benefits and alternatives to treatment. A timeout was performed prior to the initiation of the procedure. Initial ultrasound scanning demonstrates a large amount of ascites within the right lower abdominal quadrant. The right lower abdomen was prepped and draped in the usual sterile fashion. 1% lidocaine was used for local anesthesia. Following this, a 19 gauge, 7-cm, Yueh catheter was introduced. An ultrasound image was saved for documentation purposes. The paracentesis was performed. The catheter was removed and a dressing was applied. The patient tolerated the procedure well without immediate post procedural complication. FINDINGS: A total of approximately 3.3 L of clear, light yellow fluid was removed. Samples were sent to the laboratory as requested by the clinical team. IMPRESSION: Successful ultrasound-guided paracentesis yielding 3.3 liters of peritoneal fluid. Read by Candiss Norse, PA-C  Electronically Signed   By: Corrie Mckusick D.O.   On: 08/08/2020 15:24     Time Spent in minutes  30     Desiree Hane M.D on 08/20/2020 at 3:32 PM  To page go to [email or web address] - password [facility]

## 2020-08-20 NOTE — Plan of Care (Signed)

## 2020-08-21 DIAGNOSIS — R140 Abdominal distension (gaseous): Secondary | ICD-10-CM | POA: Diagnosis not present

## 2020-08-21 DIAGNOSIS — Z23 Encounter for immunization: Secondary | ICD-10-CM | POA: Diagnosis not present

## 2020-08-21 DIAGNOSIS — R1084 Generalized abdominal pain: Secondary | ICD-10-CM | POA: Diagnosis not present

## 2020-08-21 DIAGNOSIS — E039 Hypothyroidism, unspecified: Secondary | ICD-10-CM | POA: Diagnosis not present

## 2020-08-21 DIAGNOSIS — M6389 Disorders of muscle in diseases classified elsewhere, multiple sites: Secondary | ICD-10-CM | POA: Diagnosis not present

## 2020-08-21 DIAGNOSIS — R531 Weakness: Secondary | ICD-10-CM | POA: Diagnosis not present

## 2020-08-21 DIAGNOSIS — J181 Lobar pneumonia, unspecified organism: Secondary | ICD-10-CM | POA: Diagnosis not present

## 2020-08-21 DIAGNOSIS — K7031 Alcoholic cirrhosis of liver with ascites: Secondary | ICD-10-CM | POA: Diagnosis not present

## 2020-08-21 DIAGNOSIS — S92242D Displaced fracture of medial cuneiform of left foot, subsequent encounter for fracture with routine healing: Secondary | ICD-10-CM | POA: Diagnosis not present

## 2020-08-21 DIAGNOSIS — R0902 Hypoxemia: Secondary | ICD-10-CM | POA: Diagnosis not present

## 2020-08-21 DIAGNOSIS — W19XXXA Unspecified fall, initial encounter: Secondary | ICD-10-CM | POA: Diagnosis not present

## 2020-08-21 DIAGNOSIS — M6281 Muscle weakness (generalized): Secondary | ICD-10-CM | POA: Diagnosis not present

## 2020-08-21 DIAGNOSIS — E440 Moderate protein-calorie malnutrition: Secondary | ICD-10-CM | POA: Diagnosis not present

## 2020-08-21 LAB — CBC WITH DIFFERENTIAL/PLATELET
Abs Immature Granulocytes: 0.1 10*3/uL — ABNORMAL HIGH (ref 0.00–0.07)
Basophils Absolute: 0 10*3/uL (ref 0.0–0.1)
Basophils Relative: 0 %
Eosinophils Absolute: 0.2 10*3/uL (ref 0.0–0.5)
Eosinophils Relative: 2 %
HCT: 26.5 % — ABNORMAL LOW (ref 36.0–46.0)
Hemoglobin: 8 g/dL — ABNORMAL LOW (ref 12.0–15.0)
Immature Granulocytes: 1 %
Lymphocytes Relative: 9 %
Lymphs Abs: 0.8 10*3/uL (ref 0.7–4.0)
MCH: 25.6 pg — ABNORMAL LOW (ref 26.0–34.0)
MCHC: 30.2 g/dL (ref 30.0–36.0)
MCV: 84.9 fL (ref 80.0–100.0)
Monocytes Absolute: 0.9 10*3/uL (ref 0.1–1.0)
Monocytes Relative: 11 %
Neutro Abs: 6.6 10*3/uL (ref 1.7–7.7)
Neutrophils Relative %: 77 %
Platelets: 50 10*3/uL — ABNORMAL LOW (ref 150–400)
RBC: 3.12 MIL/uL — ABNORMAL LOW (ref 3.87–5.11)
RDW: 18.7 % — ABNORMAL HIGH (ref 11.5–15.5)
WBC: 8.6 10*3/uL (ref 4.0–10.5)
nRBC: 0 % (ref 0.0–0.2)

## 2020-08-21 LAB — MAGNESIUM: Magnesium: 1.7 mg/dL (ref 1.7–2.4)

## 2020-08-21 LAB — SARS CORONAVIRUS 2 BY RT PCR (HOSPITAL ORDER, PERFORMED IN ~~LOC~~ HOSPITAL LAB): SARS Coronavirus 2: NEGATIVE

## 2020-08-21 MED ORDER — LACTULOSE 10 GM/15ML PO SOLN: 20.0000 g | Freq: Three times a day (TID) | ORAL | 0 refills | Status: DC

## 2020-08-21 MED ORDER — TRAZODONE HCL 50 MG PO TABS: 50.0000 mg | ORAL_TABLET | Freq: Every evening | ORAL | 0 refills | Status: DC | PRN

## 2020-08-21 MED ORDER — SERTRALINE HCL 50 MG PO TABS
50.0000 mg | ORAL_TABLET | Freq: Every day | ORAL | 0 refills | Status: DC
Start: 2020-08-21 — End: 2021-01-08

## 2020-08-21 MED ORDER — SPIRONOLACTONE 50 MG PO TABS: 150.0000 mg | ORAL_TABLET | Freq: Every day | ORAL | Status: DC

## 2020-08-21 MED ORDER — FUROSEMIDE 40 MG PO TABS
40.0000 mg | ORAL_TABLET | Freq: Every day | ORAL | Status: DC
Start: 2020-08-21 — End: 2020-09-08

## 2020-08-21 MED ORDER — OXYCODONE HCL 5 MG PO TABS
5.0000 mg | ORAL_TABLET | Freq: Two times a day (BID) | ORAL | 0 refills | Status: DC | PRN
Start: 2020-08-21 — End: 2020-09-08

## 2020-08-21 NOTE — Plan of Care (Signed)

## 2020-08-21 NOTE — TOC Transition Note (Signed)
Transition of Care [facility]) - CM/SW Discharge Note   Patient Details  Name: Megan Roberson MRN: [id number] Date of Birth: Oct 11, 1956  [facility] CM/SW Contact:  Salome Arnt, [place] Phone Number: 08/21/2020, 2:58 PM   Clinical Narrative:  Pt d/c today to [place]. Per SNF, auth received. Pt, pt's husband, facility, and RN notified. RN given number for report. Pt and husband request transport via [facility]. LCSW arranged. D/C summary sent on hub. COVID negative 08/21/20.       Final next level of care: Skilled Nursing Facility Barriers to Discharge: Barriers Resolved   Patient Goals and CMS Choice Patient states their goals for this hospitalization and ongoing recovery are:: Short-term SNF CMS Medicare.gov Compare Post Acute Care list provided to:: Patient Choice offered to / list presented to : Patient  Discharge Placement              Patient chooses bed at: Other - please specify in the comment section below: ([facility]) Patient to be transferred to facility by: [facility] Name of family member notified: Anthony-husband Patient and family notified of of transfer: 08/21/20  Discharge Plan and Services In-house Referral: Clinical Social Work Discharge Planning Services: NA Post Acute Care Choice: [facility]                               Social Determinants of Health (SDOH) Interventions     Readmission Risk Interventions Readmission Risk Prevention Plan 08/14/2020  Transportation Screening Complete  PCP or Specialist Appt within 3-5 Days Not Complete  Not Complete comments Anticipating discharge to SNF  [place] or [place] Complete  Social Work Consult for [place] Planning/Counseling Complete  Palliative Care Screening Not Applicable  Medication Review Press photographer) Complete  Some recent data might be hidden

## 2020-08-21 NOTE — Progress Notes (Signed)
Discharge instructions given to nurse Ebony Hail at [facility] and [place]. Medications discussed with nurse. Answered all questions at this time. Await on EMS.

## 2020-08-21 NOTE — Plan of Care (Signed)
  Problem: Education: Goal: Knowledge of General Education information will improve Description: Including pain rating scale, medication(s)/side effects and non-pharmacologic comfort measures 08/21/2020 1435 by Cameron Ali, RN Outcome: Completed/Met 08/21/2020 1035 by Cameron Ali, RN Outcome: Progressing   Problem: Health Behavior/Discharge Planning: Goal: Ability to manage health-related needs will improve 08/21/2020 1435 by Cameron Ali, RN Outcome: Completed/Met 08/21/2020 1035 by Cameron Ali, RN Outcome: Progressing   Problem: Clinical Measurements: Goal: Ability to maintain clinical measurements within normal limits will improve 08/21/2020 1435 by Cameron Ali, RN Outcome: Completed/Met 08/21/2020 1035 by Cameron Ali, RN Outcome: Progressing Goal: Will remain free from infection 08/21/2020 1435 by Cameron Ali, RN Outcome: Completed/Met 08/21/2020 1035 by Cameron Ali, RN Outcome: Progressing Goal: Diagnostic test results will improve 08/21/2020 1435 by Cameron Ali, RN Outcome: Completed/Met 08/21/2020 1035 by Cameron Ali, RN Outcome: Progressing Goal: Respiratory complications will improve 08/21/2020 1435 by Cameron Ali, RN Outcome: Completed/Met 08/21/2020 1035 by Cameron Ali, RN Outcome: Progressing Goal: Cardiovascular complication will be avoided 08/21/2020 1435 by Cameron Ali, RN Outcome: Completed/Met 08/21/2020 1035 by Cameron Ali, RN Outcome: Progressing   Problem: Activity: Goal: Risk for activity intolerance will decrease 08/21/2020 1435 by Cameron Ali, RN Outcome: Completed/Met 08/21/2020 1035 by Cameron Ali, RN Outcome: Progressing   Problem: Nutrition: Goal: Adequate nutrition will be maintained 08/21/2020 1435 by Cameron Ali, RN Outcome: Completed/Met 08/21/2020 1035 by Cameron Ali, RN Outcome: Progressing   Problem: Coping: Goal: Level  of anxiety will decrease 08/21/2020 1435 by Cameron Ali, RN Outcome: Completed/Met 08/21/2020 1035 by Cameron Ali, RN Outcome: Progressing   Problem: Elimination: Goal: Will not experience complications related to bowel motility 08/21/2020 1435 by Cameron Ali, RN Outcome: Completed/Met 08/21/2020 1035 by Cameron Ali, RN Outcome: Progressing Goal: Will not experience complications related to urinary retention 08/21/2020 1435 by Cameron Ali, RN Outcome: Completed/Met 08/21/2020 1035 by Cameron Ali, RN Outcome: Progressing   Problem: Pain Managment: Goal: General experience of comfort will improve 08/21/2020 1435 by Cameron Ali, RN Outcome: Completed/Met 08/21/2020 1035 by Cameron Ali, RN Outcome: Progressing   Problem: Safety: Goal: Ability to remain free from injury will improve 08/21/2020 1435 by Cameron Ali, RN Outcome: Completed/Met 08/21/2020 1035 by Cameron Ali, RN Outcome: Progressing   Problem: Skin Integrity: Goal: Risk for impaired skin integrity will decrease 08/21/2020 1435 by Cameron Ali, RN Outcome: Completed/Met 08/21/2020 1035 by Cameron Ali, RN Outcome: Progressing

## 2020-08-21 NOTE — Care Management Important Message (Signed)
Important Message  Patient Details  Name: Megan Roberson MRN: [id number] Date of Birth: [birth year]   Medicare Important Message Given:  Yes     Tommy Medal 08/21/2020, 9:53 AM

## 2020-08-21 NOTE — Discharge Summary (Signed)
Physician Discharge Summary  Megan Roberson DGU:[id number] DOB: [birth year] DOA: 08/13/2020  PCP: Celene Squibb, MD  Admit date: 08/13/2020 Discharge date: 08/21/2020  Time spent: 37   Recommendations for Outpatient Follow-up:  1. needs Chem-12, CBC, INR minutes in about 1 week 2. Patient to follow-up with gastroenterology Dr. Havery Moros for consolidation of GI care 3. Recommend goals of care in the outpatient setting and titration of Xifaxan or lactulose as needed 4. Please arrange for outpatient paracentesis in the next 2 to 3 weeks if continues to gain weight on diuretic regimen 5. Will need outpatient x-ray of left foot in about 1 week from discharge and if poor healing will need to be referred to orthopedic surgery  Discharge Diagnoses:  Principal Problem:   Generalized weakness Active Problems:   Cirrhosis of liver with ascites (HCC)   Pancytopenia (HCC)   Abdominal distension   Abdominal pain   Thrombocytopenia (HCC)   Malnutrition of moderate degree (HCC)   Hypothyroidism   Displaced fracture of medial cuneiform of left foot, initial encounter for closed fracture   Failure to thrive in adult   Asymptomatic bacteriuria   Right lower lobe pneumonia   Mood disorder [facility])   Discharge Condition: Fair  Diet recommendation: Very low salt less than 2 g  Filed Weights   08/13/20 1744 08/14/20 0153  Weight: 54.4 kg 57 kg    History of present illness:  64 year old white female cirrhosis of liver pancytopenia hypothyroid reflux  Follows with Dr. Levada Schilling 08/06/2020 due to misunderstanding with not taking her Lasix since mid September although was taking Aldactone 150 daily baseline weight about 54 kg Admitted with profound ascites hepatic encephalopathy and acute fracture in the medial cuneiform left foot Started on ceftriaxone for concern of UTI and underwent IR paracentesis 2 L 10/13 and outpatient paracentesis 3.3 L 10/7  GI consulted for assistance 10/14 felt she  may have possible effusion versus pneumonia procalcitonin was negative and her and her antibiotics were discontinued and she was given albumin post paracentesis   Hospital Course:  Decompensated cirrhosis with hepatic encephalopathy/ascites secondary to nonadherence to medical regimen  SBP was not noted this admission she was asked to continue rifaximin and lactulose and continue home dosages of Aldactone 150 Lasix 40 and will follow up with Dr. 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 Studies not automatically included, echos, thoracentesis, etc; not x-rays)  Consultations:  Gastroenterology   Discharge Exam: Vitals:   08/20/20 2015 08/21/20 0442  BP:  101/67  Pulse:  77  Resp:  20  Temp:  98.5 F (36.9 C)  SpO2: 95% 96%    General: awake alert coherent no icterus no pallor  neck soft supple without masses  some mild strabismus  Cardiovascular: S1-S2 no murmur rub or gallop RRR Respiratory: Clinically clear no rales no rhonchi no adventitious sounds Abdomen obese  mild tympany with some shifting dullness No asterixis Left lower extremity is wrapped in a Coban  Discharge Instructions   Discharge Instructions    Diet - low sodium heart healthy   Complete by: As directed    Increase activity slowly   Complete by: As directed      Allergies as of 08/21/2020      Reactions   Penicillins Swelling, Other (See Comments)   FACIAL SWELLING Did it involve swelling of the face/tongue/throat, SOB, or low BP? Yes Did it involve sudden or severe rash/hives, skin peeling, or any reaction on the inside of your mouth or nose? No Did you need to seek medical attention at a hospital or doctor's office? Unknown When did it last happen?30 years If all above answers are "NO", may proceed with cephalosporin use.   Tylenol [acetaminophen] Nausea Only   Sulfa Antibiotics Itching, Rash   Tramadol Swelling, Rash      Medication List    STOP taking these medications   denosumab 60 MG/ML Sosy injection Commonly known as: PROLIA   hydrOXYzine 50 MG tablet Commonly known as: ATARAX/VISTARIL   levOCARNitine 330 MG tablet Commonly known as: CARNITOR   Potassium Chloride ER 20 MEQ Tbcr   promethazine 12.5 MG tablet Commonly known as: PHENERGAN     TAKE these medications   diclofenac Sodium 1 % Gel Commonly known as: VOLTAREN Apply 2 g topically daily as needed (for pain).   furosemide 40 MG tablet Commonly known as: LASIX Take 1 tablet (40 mg total) by mouth daily. What changed:   medication strength  how much to take   lactulose 10 GM/15ML solution Commonly known as: CHRONULAC Take 30 mLs (20 g total) by mouth 3 (three) times daily.   levothyroxine 75 MCG tablet Commonly known as: SYNTHROID Take 1 tablet (75 mcg total) by mouth daily before breakfast.   omeprazole 20 MG capsule Commonly known as: PRILOSEC Take 1 capsule (20 mg total) by mouth daily.   ondansetron 4 MG disintegrating tablet Commonly known as: Zofran ODT Take 1  tablet (4 mg total) by mouth every 8 (eight) hours as needed for nausea or vomiting.   oxyCODONE 5 MG immediate release tablet Commonly known as: Oxy IR/ROXICODONE Take 1 tablet (5 mg total) by mouth every 12 (twelve) hours as needed for severe pain.   rifaximin 550 MG Tabs tablet Commonly known as: XIFAXAN Take 1 tablet (550 mg total) by mouth 2 (two) times daily.   sertraline 50 MG tablet Commonly known as: ZOLOFT Take 1 tablet (50 mg total) by mouth daily.   spironolactone 50 MG tablet Commonly known as: ALDACTONE Take 3 tablets (150 mg total) by mouth daily. What changed: medication strength   traZODone 50 MG tablet Commonly known as: DESYREL Take 1 tablet (50 mg total) by mouth at bedtime as needed for sleep.      Allergies  Allergen Reactions  . Penicillins Swelling and Other (See Comments)    FACIAL SWELLING Did it involve swelling of the face/tongue/throat, SOB, or low BP? Yes Did it involve sudden or severe rash/hives, skin peeling, or any reaction on the inside of your mouth or nose? No Did you need to seek medical attention at a hospital or doctor's office? Unknown When did it last happen?30 years If all above answers are "  NO", may proceed with cephalosporin use.   . Tylenol [Acetaminophen] Nausea Only  . Sulfa Antibiotics Itching and Rash  . Tramadol Swelling and Rash    Contact information for after-discharge care    [facility] .   Service: Skilled Nursing Contact information: [address] [place] [phone]                   The results of significant diagnostics from this hospitalization (including imaging, microbiology, ancillary and laboratory) are listed below for reference.    Significant Diagnostic Studies: CT Head Wo Contrast  Result Date: 08/13/2020 CLINICAL DATA:  Fall, head injury EXAM: CT HEAD WITHOUT CONTRAST TECHNIQUE: Contiguous axial images  were obtained from the base of the skull through the vertex without intravenous contrast. COMPARISON:  None. FINDINGS: Brain: Normal anatomic configuration. Parenchymal volume loss is commensurate with the patient's age. No abnormal intra or extra-axial mass lesion or fluid collection. No abnormal mass effect or midline shift. No evidence of acute intracranial hemorrhage or infarct. Ventricular size is normal. Cerebellum unremarkable. Vascular: No asymmetric hyperdense vasculature at the skull base. Skull: Intact Sinuses/Orbits: Paranasal sinuses are clear. Orbits are unremarkable. Other: Mastoid air cells and middle ear cavities are clear. IMPRESSION: No acute intracranial injury.  No calvarial fracture. Electronically Signed   By: Fidela Salisbury MD   On: 08/13/2020 22:06   CT CHEST W CONTRAST  Result Date: 08/16/2020 CLINICAL DATA:  Possible pneumonia on abdomen and pelvis CT scan yesterday which was performed for abdominal pain and distension. EXAM: CT CHEST WITH CONTRAST TECHNIQUE: Multidetector CT imaging of the chest was performed during intravenous contrast administration. CONTRAST:  75 mL OMNIPAQUE IOHEXOL 300 MG/ML  SOLN COMPARISON:  CT abdomen and pelvis 08/15/2020. CT chest, abdomen and pelvis 07/07/2018. FINDINGS: Cardiovascular: No significant vascular findings. Normal heart size. No pericardial effusion. Mediastinum/Nodes: No enlarged mediastinal, hilar, or axillary lymph nodes. Thyroid gland, trachea, and esophagus demonstrate no significant findings. Lungs/Pleura: Small bilateral pleural effusions are noted. Small locule of air in the patient's left effusion seen on the patient's CT scan yesterday is no longer present. There is airspace disease in the right lower lobe as seen on yesterday's CT with an appearance most consistent with pneumonia. Upper Abdomen: Cirrhosis, small volume of ascites and changes of portal venous hypertension of ascites again seen. See report of CT scan yesterday.  Musculoskeletal: No acute abnormality. Mild, remote superior endplate compression fracture of T8 is seen. The patient also has a remote compression fracture of L1 which is partially imaged. IMPRESSION: Right lower lobe airspace disease has an appearance most consistent with pneumonia and is unchanged since yesterday's exam. Very small bilateral pleural effusions. No air is seen in the left effusion on today's study. Cirrhosis, small volume of abdominal ascites and portal venous hypertension as seen on CT scan yesterday. Electronically Signed   By: Inge Rise M.D.   On: 08/16/2020 15:13   CT ABDOMEN PELVIS W CONTRAST  Result Date: 08/15/2020 CLINICAL DATA:  Abdominal distension, pain. Cirrhosis. Paracentesis performed on 08/14/2020 EXAM: CT ABDOMEN AND PELVIS WITH CONTRAST TECHNIQUE: Multidetector CT imaging of the abdomen and pelvis was performed using the standard protocol following bolus administration of intravenous contrast. CONTRAST:  37m OMNIPAQUE IOHEXOL 300 MG/ML  SOLN COMPARISON:  07/07/2020 FINDINGS: Lower chest: Patchy predominantly ground-glass opacity within the right lower lobe is new from prior. Small left pleural effusion with a small amount of air present within the  non dependent portion of the collection (series 5, image 11). Heart size within normal limits. No pericardial effusion. Hepatobiliary: Cirrhotic hepatic morphology. No focal liver lesion is identified. There is recanalization of the umbilical vein. Portal vein is well opacified. Multiple gallstones present within the gallbladder. No biliary dilatation. Pancreas: Unremarkable. Spleen: Splenomegaly.  No focal splenic lesion. Adrenals/Urinary Tract: Unremarkable adrenal glands. 12 x 7 mm stone remains present within the right renal pelvis. Mild right-sided hydronephrosis. No left hydronephrosis. Kidneys enhance symmetrically. Urinary bladder is unremarkable. Stomach/Bowel: There is long segment mild circumferential wall  thickening predominantly involving the sigmoid and descending colon. No definite pericolonic inflammatory changes, although this evaluation is significantly limited given the presence of abdominopelvic ascites. Similar degree of wall thickening involving the cecum and ascending colon. No dilated loops of small bowel. Stomach within normal limits. Vascular/Lymphatic: Findings of portal hypertension. No acute vascular abnormality is identified. No abdominopelvic lymphadenopathy. Reproductive: Unremarkable uterus. Stable appearance of bilateral adnexal cysts, previously characterized as benign on MRI from January 2021. Other: Small to moderate volume ascites. No organized abdominopelvic collection or abscess. No pneumoperitoneum. Musculoskeletal: Posttraumatic and postsurgical changes to the left hip and left hemipelvis. Chronic thoracolumbar compression fractures as well as sacral fracture. No new or acute osseous findings. IMPRESSION: 1. New ground-glass opacity within the right lower lobe suspicious for pneumonia. 2. Small left pleural effusion now containing a small amount of air. Etiology is unclear in the absence of recent thoracentesis. Consider CT chest for further evaluation. 3. Interval development of long segment mild wall thickening within the sigmoid and descending colon. No definite pericolonic inflammatory changes, although this evaluation is significantly limited given the presence of abdominopelvic ascites. Findings may represent an infectious or inflammatory colitis versus changes related to portal colopathy. 4. Mild right-sided hydronephrosis. 12 x 7 mm stone remains present within the right renal pelvis. 5. Cirrhosis with evidence of portal hypertension including splenomegaly and small to moderate volume ascites. 6. Cholelithiasis. Electronically Signed   By: Davina Poke D.O.   On: 08/15/2020 17:44   US Paracentesis  Result Date: 08/14/2020 INDICATION: Ascites secondary to hepatic  cirrhosis. Request for diagnostic and therapeutic paracentesis. EXAM: ULTRASOUND GUIDED PARACENTESIS MEDICATIONS: 1% lidocaine 10 mL COMPLICATIONS: None immediate. PROCEDURE: Informed written consent was obtained from the patient after a discussion of the risks, benefits and alternatives to treatment. A timeout was performed prior to the initiation of the procedure. Initial ultrasound scanning demonstrates a moderate amount of ascites within the left lateral abdomen quadrant. The left lateral abdomen was prepped and draped in the usual sterile fashion. 1% lidocaine was used for local anesthesia. Following this, a 19 gauge, 7-cm, Yueh catheter was introduced. An ultrasound image was saved for documentation purposes. The paracentesis was performed. The catheter was removed and a dressing was applied. The patient tolerated the procedure well without immediate post procedural complication. FINDINGS: A total of approximately 2.2 L of clear yellow fluid was removed. Samples were sent to the laboratory as requested by the clinical team. IMPRESSION: Successful ultrasound-guided paracentesis yielding 2.2 liters of peritoneal fluid. Read by: Gareth Eagle, PA-C Electronically Signed   By: Marijo Conception M.D.   On: 08/14/2020 12:53   DG Abd Portable 1V  Result Date: 08/15/2020 CLINICAL DATA:  Abdominal pain and distension EXAM: PORTABLE ABDOMEN - 1 VIEW COMPARISON:  CT abdomen and pelvis July 07, 2020 FINDINGS: There is mild dilatation of loops of colon. There is mild wall thickening in the descending colon which may have inflammatory etiology.  There is moderate stool in the colon. There is no appreciable air-fluid levels. There are gallstones in the right upper quadrant. There is a calcification in the region of the right renal pelvis measuring 1.2 x 0.8 cm. There is postoperative change in the proximal femur. No evident free air. Visualized lung bases are. IMPRESSION: Question a degree of colitis. No bowel  obstruction appreciable. No free air. Gallstones evident in right upper quadrant. Visualized lung bases clear. Calcification in the region of the right renal pelvis measuring 1.2 x 0.8 cm. Obstruction at or near the right ureteropelvic junction from this apparent calculus cannot be excluded by radiography. Electronically Signed   By: Lowella Grip III M.D.   On: 08/15/2020 12:50   DG Foot Complete Left  Result Date: 08/13/2020 CLINICAL DATA:  Fall, left foot bruising EXAM: LEFT FOOT - COMPLETE 3+ VIEW COMPARISON:  None. 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 There is an acute intra-articular fracture of the probable medial navicular noted dorsally with fracture fragments in near anatomic alignment. Soft tissues are unremarkable. Joint spaces appear preserved. IMPRESSION: Tiny minimally displaced fracture of the probable medial cuneiform dorsally with fracture fragments in anatomic alignment. Electronically Signed   By: Fidela Salisbury MD   On: 08/13/2020 22:31   IR Paracentesis  Result Date: 08/08/2020 INDICATION: Patient with history of NASH cirrhosis, new onset ascites. Request to IR for diagnostic and therapeutic paracentesis. EXAM: ULTRASOUND GUIDED DIAGNOSTIC AND THERAPEUTIC PARACENTESIS MEDICATIONS: 10 mL 1% lidocaine COMPLICATIONS: None immediate. PROCEDURE: Informed written consent was obtained from the patient after a discussion of the risks, benefits and alternatives to treatment. A timeout was performed prior to the initiation of the procedure. Initial ultrasound scanning demonstrates a large amount of ascites within the right lower abdominal quadrant. The right lower abdomen was prepped and draped in the usual sterile fashion. 1% lidocaine was used for local anesthesia. Following this, a 19 gauge, 7-cm, Yueh catheter was introduced. An ultrasound image was saved for documentation purposes. The  paracentesis was performed. The catheter was removed and a dressing was applied. The patient tolerated the procedure well without immediate post procedural complication. FINDINGS: A total of approximately 3.3 L of clear, light yellow fluid was removed. Samples were sent to the laboratory as requested by the clinical team. IMPRESSION: Successful ultrasound-guided paracentesis yielding 3.3 liters of peritoneal fluid. Read by Candiss Norse, PA-C Electronically Signed   By: Corrie Mckusick D.O.   On: 08/08/2020 15:24    Microbiology: Recent Results (from the past 240 hour(s))  Resp Panel by RT PCR (RSV, Flu A&B, Covid) - Nasopharyngeal Swab     Status: None   Collection Time: 08/13/20  9:42 PM   Specimen: Nasopharyngeal Swab  Result Value Ref Range Status   SARS Coronavirus 2 by RT PCR NEGATIVE NEGATIVE Final    Comment: (NOTE) SARS-CoV-2 target nucleic acids are NOT DETECTED.  The SARS-CoV-2 RNA is generally detectable in upper respiratoy specimens during the acute phase of infection. The lowest concentration of SARS-CoV-2 viral copies this assay can detect is 131 copies/mL. A negative result does not preclude SARS-Cov-2 infection and should not be used as the sole basis for treatment or other patient management decisions. A negative result may occur with  improper specimen collection/handling, submission of specimen other than nasopharyngeal swab, presence of viral mutation(s) within the areas targeted by this assay, and inadequate number of viral copies (<131 copies/mL). A  negative result must be combined with clinical observations, patient history, and epidemiological information. The expected result is Negative.  Fact Sheet for Patients:  PinkCheek.be  Fact Sheet for Healthcare Providers:  GravelBags.it  This test is no t yet approved or cleared by the [place] FDA and  has been authorized for detection and/or diagnosis  of SARS-CoV-2 by FDA under an Emergency Use Authorization (EUA). This EUA will remain  in effect (meaning this test can be used) for the duration of the COVID-19 declaration under Section 564(b)(1) of the Act, 21 U.S.C. section 360bbb-3(b)(1), unless the authorization is terminated or revoked sooner.     Influenza A by PCR NEGATIVE NEGATIVE Final   Influenza B by PCR NEGATIVE NEGATIVE Final    Comment: (NOTE) The Xpert Xpress SARS-CoV-2/FLU/RSV assay is intended as an aid in  the diagnosis of influenza from Nasopharyngeal swab specimens and  should not be used as a sole basis for treatment. Nasal washings and  aspirates are unacceptable for Xpert Xpress SARS-CoV-2/FLU/RSV  testing.  Fact Sheet for Patients: PinkCheek.be  Fact Sheet for Healthcare Providers: GravelBags.it  This test is not yet approved or cleared by the [place] FDA and  has been authorized for detection and/or diagnosis of SARS-CoV-2 by  FDA under an Emergency Use Authorization (EUA). This EUA will remain  in effect (meaning this test can be used) for the duration of the  Covid-19 declaration under Section 564(b)(1) of the Act, 21  U.S.C. section 360bbb-3(b)(1), unless the authorization is  terminated or revoked.    Respiratory Syncytial Virus by PCR NEGATIVE NEGATIVE Final    Comment: (NOTE) Fact Sheet for Patients: PinkCheek.be  Fact Sheet for Healthcare Providers: GravelBags.it  This test is not yet approved or cleared by the [place] FDA and  has been authorized for detection and/or diagnosis of SARS-CoV-2 by  FDA under an Emergency Use Authorization (EUA). This EUA will remain  in effect (meaning this test can be used) for the duration of the  COVID-19 declaration under Section 564(b)(1) of the Act, 21 U.S.C.  section 360bbb-3(b)(1), unless the authorization is terminated or   revoked. Performed at [facility], [address] [place], [place] [address]   Urine culture     Status: Abnormal   Collection Time: 08/13/20 10:52 PM   Specimen: Urine, Random  Result Value Ref Range Status   Specimen Description   Final    URINE, RANDOM Performed at [facility], [address] [place], [place] [address]    Special Requests   Final    NONE Performed at [facility], [address] [place],  [address]    Culture >=100,000 COLONIES/mL CITROBACTER FREUNDII (A)  Final   Report Status 08/16/2020 FINAL  Final   Organism ID, Bacteria CITROBACTER FREUNDII (A)  Final      Susceptibility   Citrobacter freundii - MIC*    CEFAZOLIN RESISTANT Resistant     CEFTRIAXONE <=0.25 SENSITIVE Sensitive     CIPROFLOXACIN <=0.25 SENSITIVE Sensitive     GENTAMICIN <=1 SENSITIVE Sensitive     IMIPENEM <=0.25 SENSITIVE Sensitive     NITROFURANTOIN <=16 SENSITIVE Sensitive     TRIMETH/SULFA <=20 SENSITIVE Sensitive     PIP/TAZO <=4 SENSITIVE Sensitive     * >=100,000 COLONIES/mL CITROBACTER FREUNDII  Body fluid culture     Status: None   Collection Time: 08/14/20 11:36 AM   Specimen: Abdomen; Peritoneal Fluid  Result Value Ref Range Status   Specimen Description   Final  ABDOMEN Performed at [facility], [address] [place], [place] [address]    Special Requests   Final    NONE Performed at [facility], [address] [place], [place] [address]    Gram Stain NO WBC SEEN NO ORGANISMS SEEN   Final   Culture   Final    NO GROWTH 3 DAYS Performed at [facility], [place] [address], [place], [place] [address]    Report Status 08/18/2020 FINAL  Final     Labs: Basic Metabolic Panel: Recent Labs  Lab 08/15/20 0616 08/16/20 6389 08/16/20 3734 08/17/20 0636 08/18/20 0607 08/19/20 0552 08/20/20 0602 08/21/20 0458  NA 132* 129*  --  131* 132* 134*  --   --   K 3.5 3.0*  --  2.7* 3.0* 3.5  --   --   CL 103 99  --  97* 103 99  --   --   CO2 22 22  --   23 23 24   --   --   GLUCOSE 83 109*  --  113* 105* 106*  --   --   BUN 14 13  --  13 13 12   --   --   CREATININE 0.53 0.57  --  0.58 0.55 0.53  --   --   CALCIUM 8.1* 8.0*  --  8.2* 8.3* 8.5*  --   --   MG  --  1.9   < > 1.9 1.8 1.8 1.8 1.7   < > = values in this interval not displayed.   Liver Function Tests: Recent Labs  Lab 08/15/20 0616 08/16/20 0613 08/17/20 0636 08/18/20 0607 08/19/20 0552  AST 44* 42* 34 31 28  ALT 24 25 23 22 20   ALKPHOS 105 104 98 95 91  BILITOT 2.0* 2.8* 3.0* 2.3* 2.2*  PROT 5.6* 5.5* 6.0* 5.9* 5.8*  ALBUMIN 2.4* 2.5* 2.9* 2.8* 2.8*   No results for input(s): LIPASE, AMYLASE in the last 168 hours. No results for input(s): AMMONIA in the last 168 hours. CBC: Recent Labs  Lab 08/17/20 0636 08/18/20 0607 08/19/20 0552 08/20/20 0602 08/21/20 0458  WBC 7.7 7.9 5.7 4.7 8.6  NEUTROABS 5.6 5.5 3.8 3.0 6.6  HGB 8.4* 8.3* 8.2* 8.6* 8.0*  HCT 28.1* 27.8* 28.2* 29.3* 26.5*  MCV 85.7 86.3 87.6 86.9 84.9  PLT 51* 50* 48* 49* 50*   Cardiac Enzymes: No results for input(s): CKTOTAL, CKMB, CKMBINDEX, TROPONINI in the last 168 hours. BNP: BNP (last 3 results) Recent Labs    05/18/20 2209  BNP 114.0*    ProBNP (last 3 results) No results for input(s): PROBNP in the last 8760 hours.  CBG: No results for input(s): GLUCAP in the last 168 hours.     Signed:  Nita Sells MD   [facility] 08/21/2020, 9:13 AM

## 2020-08-22 ENCOUNTER — Other Ambulatory Visit: Payer: Self-pay

## 2020-08-22 LAB — VITAMIN B1: Vitamin B1 (Thiamine): 70.4 nmol/L (ref 66.5–200.0)

## 2020-08-22 NOTE — Patient Outreach (Signed)
[facility] [facility]) Care Management  08/22/2020  Megan Roberson [birth year] [id number]   Referral Date: 08/22/20 Referral Source: [employer] Report Date of Discharge: 08/21/20 Facility:  [place]: [facility]   Referral received.  No outreach warranted at this time.  Patient discharged to [facility].    Plan: RN CM will close case.    Jone Baseman, RN, MSN [facility] Care Management Care Management Coordinator Direct Line [phone] Toll Free: [phone]  Fax: [phone]

## 2020-08-24 LAB — VITAMIN B1: Vitamin B1 (Thiamine): 80.8 nmol/L (ref 66.5–200.0)

## 2020-08-28 ENCOUNTER — Other Ambulatory Visit: Payer: Self-pay

## 2020-08-28 NOTE — Patient Outreach (Addendum)
[facility] [facility]) Care Management  08/28/2020  Megan Roberson 1956-04-19 [id number]   Referral Date: 08/28/20 Referral Source: [employer] Report Date of Discharge: 03/49/61 Facility: [place]: [place] attempt: No answer.  Unable to leave a voicemail.  RN CM will attempt again within 4 business days and send letter.     Jone Baseman, RN, MSN [facility] Care Management Care Management Coordinator Direct Line [phone] Toll Free: [phone]  Fax: [phone]

## 2020-08-29 ENCOUNTER — Other Ambulatory Visit: Payer: Self-pay

## 2020-08-29 NOTE — Patient Outreach (Signed)
[place] [facility]) Care Management  08/29/2020  Megan Roberson 12-09-1955 [id number]   Referral Date: 08/28/20 Referral Source: [employer] Report Date of Discharge: 78/[birth year] Facility: [place]: [place] attempt: No answer.  Unable to leave a voicemail.  RN CM will attempt again within 4 business days.  Jone Baseman, RN, MSN [place] Management Care Management Coordinator Direct Line [phone] Cell [phone] Toll Free: [phone]  Fax: [phone]

## 2020-08-30 ENCOUNTER — Ambulatory Visit: Payer: Medicare HMO | Admitting: Nurse Practitioner

## 2020-08-30 ENCOUNTER — Other Ambulatory Visit: Payer: Self-pay

## 2020-08-30 NOTE — Patient Outreach (Signed)
[place] [facility]) Care Management  08/30/2020  Xavia Kniskern 08/03/1956 [id number]   Referral Date:08/28/20 Referral Source:[employer] Report Date of PBHEBBWNJ:54/23/70 [place] attempt:No answer. Unable to leave a voicemail.  RN CM willattempt again in the month of November.   Jone Baseman, RN, MSN [place] Management Care Management Coordinator Direct Line [phone] Cell [phone] Toll Free: [phone]  Fax: [phone]

## 2020-09-02 ENCOUNTER — Inpatient Hospital Stay (HOSPITAL_COMMUNITY)
Admission: EM | Admit: 2020-09-02 | Discharge: 2020-09-08 | DRG: 432 | Disposition: A | Discharge status: Home Health | Payer: Medicare HMO | Attending: Internal Medicine | Admitting: Internal Medicine

## 2020-09-02 ENCOUNTER — Other Ambulatory Visit: Payer: Self-pay

## 2020-09-02 ENCOUNTER — Telehealth: Payer: Self-pay

## 2020-09-02 ENCOUNTER — Encounter (HOSPITAL_COMMUNITY): Payer: Self-pay | Admitting: *Deleted

## 2020-09-02 DIAGNOSIS — Z79890 Hormone replacement therapy (postmenopausal): Secondary | ICD-10-CM | POA: Diagnosis not present

## 2020-09-02 DIAGNOSIS — Z7401 Bed confinement status: Secondary | ICD-10-CM | POA: Diagnosis not present

## 2020-09-02 DIAGNOSIS — Z9119 Patient's noncompliance with other medical treatment and regimen: Secondary | ICD-10-CM

## 2020-09-02 DIAGNOSIS — Z681 Body mass index (BMI) 19 or less, adult: Secondary | ICD-10-CM | POA: Diagnosis not present

## 2020-09-02 DIAGNOSIS — J948 Other specified pleural conditions: Secondary | ICD-10-CM | POA: Diagnosis not present

## 2020-09-02 DIAGNOSIS — E876 Hypokalemia: Secondary | ICD-10-CM | POA: Diagnosis present

## 2020-09-02 DIAGNOSIS — K7031 Alcoholic cirrhosis of liver with ascites: Secondary | ICD-10-CM | POA: Diagnosis present

## 2020-09-02 DIAGNOSIS — Z79899 Other long term (current) drug therapy: Secondary | ICD-10-CM

## 2020-09-02 DIAGNOSIS — M810 Age-related osteoporosis without current pathological fracture: Secondary | ICD-10-CM | POA: Diagnosis present

## 2020-09-02 DIAGNOSIS — K7460 Unspecified cirrhosis of liver: Secondary | ICD-10-CM | POA: Diagnosis present

## 2020-09-02 DIAGNOSIS — N390 Urinary tract infection, site not specified: Secondary | ICD-10-CM

## 2020-09-02 DIAGNOSIS — E039 Hypothyroidism, unspecified: Secondary | ICD-10-CM | POA: Diagnosis present

## 2020-09-02 DIAGNOSIS — M62838 Other muscle spasm: Secondary | ICD-10-CM | POA: Diagnosis present

## 2020-09-02 DIAGNOSIS — N136 Pyonephrosis: Secondary | ICD-10-CM | POA: Diagnosis present

## 2020-09-02 DIAGNOSIS — R000 Tachycardia, unspecified: Secondary | ICD-10-CM | POA: Diagnosis not present

## 2020-09-02 DIAGNOSIS — R296 Repeated falls: Secondary | ICD-10-CM | POA: Diagnosis present

## 2020-09-02 DIAGNOSIS — K7040 Alcoholic hepatic failure without coma: Principal | ICD-10-CM | POA: Diagnosis present

## 2020-09-02 DIAGNOSIS — R140 Abdominal distension (gaseous): Secondary | ICD-10-CM | POA: Diagnosis not present

## 2020-09-02 DIAGNOSIS — Z9114 Patient's other noncompliance with medication regimen: Secondary | ICD-10-CM | POA: Diagnosis not present

## 2020-09-02 DIAGNOSIS — E440 Moderate protein-calorie malnutrition: Secondary | ICD-10-CM | POA: Diagnosis present

## 2020-09-02 DIAGNOSIS — K766 Portal hypertension: Secondary | ICD-10-CM | POA: Diagnosis present

## 2020-09-02 DIAGNOSIS — D649 Anemia, unspecified: Secondary | ICD-10-CM | POA: Diagnosis present

## 2020-09-02 DIAGNOSIS — F411 Generalized anxiety disorder: Secondary | ICD-10-CM | POA: Diagnosis present

## 2020-09-02 DIAGNOSIS — D696 Thrombocytopenia, unspecified: Secondary | ICD-10-CM | POA: Diagnosis not present

## 2020-09-02 DIAGNOSIS — R1084 Generalized abdominal pain: Secondary | ICD-10-CM | POA: Diagnosis not present

## 2020-09-02 DIAGNOSIS — R109 Unspecified abdominal pain: Secondary | ICD-10-CM | POA: Diagnosis present

## 2020-09-02 DIAGNOSIS — M25572 Pain in left ankle and joints of left foot: Secondary | ICD-10-CM | POA: Diagnosis not present

## 2020-09-02 DIAGNOSIS — J9811 Atelectasis: Secondary | ICD-10-CM | POA: Diagnosis not present

## 2020-09-02 DIAGNOSIS — R319 Hematuria, unspecified: Secondary | ICD-10-CM | POA: Diagnosis not present

## 2020-09-02 DIAGNOSIS — W19XXXA Unspecified fall, initial encounter: Secondary | ICD-10-CM | POA: Diagnosis not present

## 2020-09-02 DIAGNOSIS — M720 Palmar fascial fibromatosis [Dupuytren]: Secondary | ICD-10-CM | POA: Diagnosis present

## 2020-09-02 DIAGNOSIS — G629 Polyneuropathy, unspecified: Secondary | ICD-10-CM | POA: Diagnosis present

## 2020-09-02 DIAGNOSIS — K7581 Nonalcoholic steatohepatitis (NASH): Secondary | ICD-10-CM | POA: Diagnosis present

## 2020-09-02 DIAGNOSIS — Z515 Encounter for palliative care: Secondary | ICD-10-CM

## 2020-09-02 DIAGNOSIS — D6959 Other secondary thrombocytopenia: Secondary | ICD-10-CM | POA: Diagnosis present

## 2020-09-02 DIAGNOSIS — R310 Gross hematuria: Secondary | ICD-10-CM | POA: Diagnosis present

## 2020-09-02 DIAGNOSIS — Z7189 Other specified counseling: Secondary | ICD-10-CM | POA: Diagnosis not present

## 2020-09-02 DIAGNOSIS — J984 Other disorders of lung: Secondary | ICD-10-CM | POA: Diagnosis not present

## 2020-09-02 DIAGNOSIS — Z20822 Contact with and (suspected) exposure to covid-19: Secondary | ICD-10-CM | POA: Diagnosis present

## 2020-09-02 DIAGNOSIS — F39 Unspecified mood [affective] disorder: Secondary | ICD-10-CM | POA: Diagnosis present

## 2020-09-02 DIAGNOSIS — R0602 Shortness of breath: Secondary | ICD-10-CM | POA: Diagnosis not present

## 2020-09-02 DIAGNOSIS — G8929 Other chronic pain: Secondary | ICD-10-CM | POA: Diagnosis present

## 2020-09-02 DIAGNOSIS — J189 Pneumonia, unspecified organism: Secondary | ICD-10-CM | POA: Diagnosis present

## 2020-09-02 DIAGNOSIS — S82892A Other fracture of left lower leg, initial encounter for closed fracture: Secondary | ICD-10-CM | POA: Diagnosis present

## 2020-09-02 DIAGNOSIS — J90 Pleural effusion, not elsewhere classified: Secondary | ICD-10-CM | POA: Diagnosis not present

## 2020-09-02 DIAGNOSIS — E8809 Other disorders of plasma-protein metabolism, not elsewhere classified: Secondary | ICD-10-CM | POA: Diagnosis not present

## 2020-09-02 DIAGNOSIS — E43 Unspecified severe protein-calorie malnutrition: Secondary | ICD-10-CM | POA: Diagnosis present

## 2020-09-02 DIAGNOSIS — R188 Other ascites: Secondary | ICD-10-CM | POA: Diagnosis present

## 2020-09-02 DIAGNOSIS — N200 Calculus of kidney: Secondary | ICD-10-CM | POA: Diagnosis not present

## 2020-09-02 DIAGNOSIS — Z09 Encounter for follow-up examination after completed treatment for conditions other than malignant neoplasm: Secondary | ICD-10-CM

## 2020-09-02 DIAGNOSIS — R531 Weakness: Secondary | ICD-10-CM

## 2020-09-02 DIAGNOSIS — R52 Pain, unspecified: Secondary | ICD-10-CM | POA: Diagnosis not present

## 2020-09-02 NOTE — ED Triage Notes (Signed)
Pt c/o abdominal pain with distention and blood with urination; pt states she also wants to make sure her pneumonia has cleared since the last time she was here

## 2020-09-02 NOTE — Telephone Encounter (Signed)
Patient is calling today to schedule a paracentesis. Pt states that her belly is "huge", she states that she is experiencing some SIB, she states that she also had pneumonia and is due for a follow up. Pt states that she is not currently on any diuretics. Please advise, thank you

## 2020-09-02 NOTE — ED Triage Notes (Signed)
Pt c/o abdominal pain and distension. Has hx of cirrhosis.

## 2020-09-02 NOTE — Telephone Encounter (Signed)
Please scheduled for large volume paracentesis, give albumin if > 5 L removed, hopefully can be done in the next 1-2 days. She was previously on aldactone and lasix, did not follow prior recommendations on diuretics. When she saw Nevin Bloodgood most recently she was on aldactone, did she stop this? She should have a BMET done after her paracentesis and can discuss resumption of diuretics. She is scheduled to see me in a few weeks otherwise. Thanks

## 2020-09-03 ENCOUNTER — Other Ambulatory Visit: Payer: Self-pay

## 2020-09-03 ENCOUNTER — Inpatient Hospital Stay (HOSPITAL_COMMUNITY): Payer: Medicare HMO

## 2020-09-03 ENCOUNTER — Emergency Department (HOSPITAL_COMMUNITY): Payer: Medicare HMO

## 2020-09-03 ENCOUNTER — Encounter (HOSPITAL_COMMUNITY): Payer: Self-pay | Admitting: Family Medicine

## 2020-09-03 DIAGNOSIS — Z7401 Bed confinement status: Secondary | ICD-10-CM | POA: Diagnosis not present

## 2020-09-03 DIAGNOSIS — J189 Pneumonia, unspecified organism: Secondary | ICD-10-CM | POA: Diagnosis present

## 2020-09-03 DIAGNOSIS — J9811 Atelectasis: Secondary | ICD-10-CM | POA: Diagnosis not present

## 2020-09-03 DIAGNOSIS — E8809 Other disorders of plasma-protein metabolism, not elsewhere classified: Secondary | ICD-10-CM | POA: Diagnosis not present

## 2020-09-03 DIAGNOSIS — D649 Anemia, unspecified: Secondary | ICD-10-CM | POA: Diagnosis present

## 2020-09-03 DIAGNOSIS — K7040 Alcoholic hepatic failure without coma: Secondary | ICD-10-CM | POA: Diagnosis present

## 2020-09-03 DIAGNOSIS — Z20822 Contact with and (suspected) exposure to covid-19: Secondary | ICD-10-CM | POA: Diagnosis present

## 2020-09-03 DIAGNOSIS — R188 Other ascites: Secondary | ICD-10-CM | POA: Diagnosis present

## 2020-09-03 DIAGNOSIS — J90 Pleural effusion, not elsewhere classified: Secondary | ICD-10-CM | POA: Diagnosis not present

## 2020-09-03 DIAGNOSIS — K7460 Unspecified cirrhosis of liver: Secondary | ICD-10-CM | POA: Diagnosis not present

## 2020-09-03 DIAGNOSIS — K7581 Nonalcoholic steatohepatitis (NASH): Secondary | ICD-10-CM | POA: Diagnosis present

## 2020-09-03 DIAGNOSIS — K766 Portal hypertension: Secondary | ICD-10-CM | POA: Diagnosis present

## 2020-09-03 DIAGNOSIS — F39 Unspecified mood [affective] disorder: Secondary | ICD-10-CM

## 2020-09-03 DIAGNOSIS — K7031 Alcoholic cirrhosis of liver with ascites: Secondary | ICD-10-CM | POA: Diagnosis present

## 2020-09-03 DIAGNOSIS — J984 Other disorders of lung: Secondary | ICD-10-CM | POA: Diagnosis not present

## 2020-09-03 DIAGNOSIS — D696 Thrombocytopenia, unspecified: Secondary | ICD-10-CM | POA: Diagnosis not present

## 2020-09-03 DIAGNOSIS — Z7189 Other specified counseling: Secondary | ICD-10-CM | POA: Diagnosis not present

## 2020-09-03 DIAGNOSIS — R296 Repeated falls: Secondary | ICD-10-CM | POA: Diagnosis present

## 2020-09-03 DIAGNOSIS — S82892A Other fracture of left lower leg, initial encounter for closed fracture: Secondary | ICD-10-CM | POA: Diagnosis present

## 2020-09-03 DIAGNOSIS — Z9114 Patient's other noncompliance with medication regimen: Secondary | ICD-10-CM | POA: Diagnosis not present

## 2020-09-03 DIAGNOSIS — Z79890 Hormone replacement therapy (postmenopausal): Secondary | ICD-10-CM | POA: Diagnosis not present

## 2020-09-03 DIAGNOSIS — R109 Unspecified abdominal pain: Secondary | ICD-10-CM | POA: Diagnosis not present

## 2020-09-03 DIAGNOSIS — Z681 Body mass index (BMI) 19 or less, adult: Secondary | ICD-10-CM | POA: Diagnosis not present

## 2020-09-03 DIAGNOSIS — J948 Other specified pleural conditions: Secondary | ICD-10-CM | POA: Diagnosis not present

## 2020-09-03 DIAGNOSIS — M25572 Pain in left ankle and joints of left foot: Secondary | ICD-10-CM | POA: Diagnosis not present

## 2020-09-03 DIAGNOSIS — M720 Palmar fascial fibromatosis [Dupuytren]: Secondary | ICD-10-CM | POA: Diagnosis present

## 2020-09-03 DIAGNOSIS — N390 Urinary tract infection, site not specified: Secondary | ICD-10-CM | POA: Diagnosis not present

## 2020-09-03 DIAGNOSIS — W19XXXA Unspecified fall, initial encounter: Secondary | ICD-10-CM | POA: Diagnosis not present

## 2020-09-03 DIAGNOSIS — D6959 Other secondary thrombocytopenia: Secondary | ICD-10-CM | POA: Diagnosis present

## 2020-09-03 DIAGNOSIS — R1084 Generalized abdominal pain: Secondary | ICD-10-CM

## 2020-09-03 DIAGNOSIS — N136 Pyonephrosis: Secondary | ICD-10-CM | POA: Diagnosis present

## 2020-09-03 DIAGNOSIS — R140 Abdominal distension (gaseous): Secondary | ICD-10-CM | POA: Diagnosis not present

## 2020-09-03 DIAGNOSIS — Z79899 Other long term (current) drug therapy: Secondary | ICD-10-CM | POA: Diagnosis not present

## 2020-09-03 DIAGNOSIS — E039 Hypothyroidism, unspecified: Secondary | ICD-10-CM | POA: Diagnosis not present

## 2020-09-03 DIAGNOSIS — M810 Age-related osteoporosis without current pathological fracture: Secondary | ICD-10-CM | POA: Diagnosis present

## 2020-09-03 DIAGNOSIS — R310 Gross hematuria: Secondary | ICD-10-CM | POA: Diagnosis present

## 2020-09-03 DIAGNOSIS — Z515 Encounter for palliative care: Secondary | ICD-10-CM

## 2020-09-03 DIAGNOSIS — E43 Unspecified severe protein-calorie malnutrition: Secondary | ICD-10-CM | POA: Diagnosis present

## 2020-09-03 DIAGNOSIS — N200 Calculus of kidney: Secondary | ICD-10-CM | POA: Diagnosis not present

## 2020-09-03 DIAGNOSIS — F411 Generalized anxiety disorder: Secondary | ICD-10-CM | POA: Diagnosis present

## 2020-09-03 DIAGNOSIS — R531 Weakness: Secondary | ICD-10-CM | POA: Diagnosis not present

## 2020-09-03 DIAGNOSIS — G629 Polyneuropathy, unspecified: Secondary | ICD-10-CM | POA: Diagnosis present

## 2020-09-03 DIAGNOSIS — R319 Hematuria, unspecified: Secondary | ICD-10-CM | POA: Diagnosis not present

## 2020-09-03 LAB — COMPREHENSIVE METABOLIC PANEL
ALT: 27 U/L (ref 0–44)
AST: 42 U/L — ABNORMAL HIGH (ref 15–41)
Albumin: 2.6 g/dL — ABNORMAL LOW (ref 3.5–5.0)
Alkaline Phosphatase: 115 U/L (ref 38–126)
Anion gap: 5 (ref 5–15)
BUN: 9 mg/dL (ref 8–23)
CO2: 22 mmol/L (ref 22–32)
Calcium: 7.9 mg/dL — ABNORMAL LOW (ref 8.9–10.3)
Chloride: 109 mmol/L (ref 98–111)
Creatinine, Ser: 0.55 mg/dL (ref 0.44–1.00)
GFR, Estimated: >60 mL/min (ref >60)
Glucose, Bld: 74 mg/dL (ref 70–99)
Potassium: 3.7 mmol/L (ref 3.5–5.1)
Sodium: 136 mmol/L (ref 135–145)
Total Bilirubin: 2.5 mg/dL — ABNORMAL HIGH (ref 0.3–1.2)
Total Protein: 6 g/dL — ABNORMAL LOW (ref 6.5–8.1)

## 2020-09-03 LAB — BODY FLUID CELL COUNT WITH DIFFERENTIAL
Eos, Fluid: 0 %
Lymphs, Fluid: 17 %
Monocyte-Macrophage-Serous Fluid: 34 % — ABNORMAL LOW (ref 50–90)
Neutrophil Count, Fluid: 49 % — ABNORMAL HIGH (ref 0–25)
Other Cells, Fluid: REACTIVE %
Total Nucleated Cell Count, Fluid: 60 cu mm (ref 0–1000)

## 2020-09-03 LAB — PROTIME-INR
INR: 1.5 — ABNORMAL HIGH (ref 0.8–1.2)
Prothrombin Time: 17.7 seconds — ABNORMAL HIGH (ref 11.4–15.2)

## 2020-09-03 LAB — CBC WITH DIFFERENTIAL/PLATELET
Abs Immature Granulocytes: 0.23 10*3/uL — ABNORMAL HIGH (ref 0.00–0.07)
Basophils Absolute: 0.1 10*3/uL (ref 0.0–0.1)
Basophils Relative: 1 %
Eosinophils Absolute: 0.1 10*3/uL (ref 0.0–0.5)
Eosinophils Relative: 1 %
HCT: 28.3 % — ABNORMAL LOW (ref 36.0–46.0)
Hemoglobin: 8.1 g/dL — ABNORMAL LOW (ref 12.0–15.0)
Immature Granulocytes: 3 %
Lymphocytes Relative: 9 %
Lymphs Abs: 0.8 10*3/uL (ref 0.7–4.0)
MCH: 25.2 pg — ABNORMAL LOW (ref 26.0–34.0)
MCHC: 28.6 g/dL — ABNORMAL LOW (ref 30.0–36.0)
MCV: 87.9 fL (ref 80.0–100.0)
Monocytes Absolute: 1.2 10*3/uL — ABNORMAL HIGH (ref 0.1–1.0)
Monocytes Relative: 13 %
Neutro Abs: 6.6 10*3/uL (ref 1.7–7.7)
Neutrophils Relative %: 73 %
Platelets: 47 10*3/uL — ABNORMAL LOW (ref 150–400)
RBC: 3.22 MIL/uL — ABNORMAL LOW (ref 3.87–5.11)
RDW: 18.6 % — ABNORMAL HIGH (ref 11.5–15.5)
WBC: 8.9 10*3/uL (ref 4.0–10.5)
nRBC: 0 % (ref 0.0–0.2)

## 2020-09-03 LAB — URINALYSIS, ROUTINE W REFLEX MICROSCOPIC
Bacteria, UA: NONE SEEN
Bilirubin Urine: NEGATIVE
Glucose, UA: NEGATIVE mg/dL
Ketones, ur: NEGATIVE mg/dL
Leukocytes,Ua: NEGATIVE
Nitrite: NEGATIVE
Protein, ur: 100 mg/dL — AB
RBC / HPF: >50 RBC/hpf — ABNORMAL HIGH (ref 0–5)
Specific Gravity, Urine: 1.028 (ref 1.005–1.030)
WBC, UA: >50 WBC/hpf — ABNORMAL HIGH (ref 0–5)
pH: 5 (ref 5.0–8.0)

## 2020-09-03 LAB — RESPIRATORY PANEL BY RT PCR (FLU A&B, COVID)
Influenza A by PCR: NEGATIVE
Influenza B by PCR: NEGATIVE
SARS Coronavirus 2 by RT PCR: NEGATIVE

## 2020-09-03 LAB — GRAM STAIN: Gram Stain: NONE SEEN

## 2020-09-03 LAB — AMMONIA: Ammonia: 43 umol/L — ABNORMAL HIGH (ref 9–35)

## 2020-09-03 LAB — LACTATE DEHYDROGENASE, PLEURAL OR PERITONEAL FLUID: LD, Fluid: 11 U/L (ref 3–23)

## 2020-09-03 LAB — LIPASE, BLOOD: Lipase: 28 U/L (ref 11–51)

## 2020-09-03 LAB — GLUCOSE, PLEURAL OR PERITONEAL FLUID: Glucose, Fluid: 96 mg/dL

## 2020-09-03 MED ORDER — SPIRONOLACTONE 25 MG PO TABS
150.0000 mg | ORAL_TABLET | Freq: Every day | ORAL | Status: DC
  Administered 2020-09-04 – 2020-09-06 (×3): 150 mg via ORAL
  Filled 2020-09-03 (×6): qty 6

## 2020-09-03 MED ORDER — LEVOTHYROXINE SODIUM 75 MCG PO TABS
75.0000 ug | ORAL_TABLET | Freq: Every day | ORAL | Status: DC
  Administered 2020-09-03 – 2020-09-08 (×6): 75 ug via ORAL
  Filled 2020-09-03: qty 1
  Filled 2020-09-03: qty 2
  Filled 2020-09-03 (×4): qty 1

## 2020-09-03 MED ORDER — MORPHINE SULFATE (PF) 4 MG/ML IV SOLN
4.0000 mg | Freq: Once | INTRAVENOUS | Status: AC
  Administered 2020-09-03: 4 mg via INTRAVENOUS
  Filled 2020-09-03: qty 1

## 2020-09-03 MED ORDER — FUROSEMIDE 40 MG PO TABS
40.0000 mg | ORAL_TABLET | Freq: Every day | ORAL | Status: DC
  Administered 2020-09-04: 40 mg via ORAL
  Filled 2020-09-03: qty 1

## 2020-09-03 MED ORDER — SPIRONOLACTONE 25 MG PO TABS
150.0000 mg | ORAL_TABLET | Freq: Every day | ORAL | Status: DC
  Filled 2020-09-03 (×2): qty 6

## 2020-09-03 MED ORDER — LACTULOSE 10 GM/15ML PO SOLN
20.0000 g | Freq: Three times a day (TID) | ORAL | Status: DC
  Administered 2020-09-03 – 2020-09-08 (×15): 20 g via ORAL
  Filled 2020-09-03 (×14): qty 30

## 2020-09-03 MED ORDER — MORPHINE SULFATE (PF) 2 MG/ML IV SOLN
2.0000 mg | Freq: Once | INTRAVENOUS | Status: AC
  Administered 2020-09-03: 2 mg via INTRAVENOUS
  Filled 2020-09-03: qty 1

## 2020-09-03 MED ORDER — SODIUM CHLORIDE 0.9 % IV SOLN: 2.0000 g | INTRAVENOUS | Status: DC

## 2020-09-03 MED ORDER — FUROSEMIDE 40 MG PO TABS
40.0000 mg | ORAL_TABLET | Freq: Every day | ORAL | Status: DC
  Administered 2020-09-03: 40 mg via ORAL
  Filled 2020-09-03: qty 1

## 2020-09-03 MED ORDER — IOHEXOL 300 MG/ML  SOLN
100.0000 mL | Freq: Once | INTRAMUSCULAR | Status: AC | PRN
  Administered 2020-09-03: 75 mL via INTRAVENOUS

## 2020-09-03 MED ORDER — DOXYCYCLINE HYCLATE 100 MG PO TABS
100.0000 mg | ORAL_TABLET | Freq: Two times a day (BID) | ORAL | Status: DC
  Administered 2020-09-03 – 2020-09-08 (×11): 100 mg via ORAL
  Filled 2020-09-03 (×11): qty 1

## 2020-09-03 MED ORDER — RIFAXIMIN 550 MG PO TABS
550.0000 mg | ORAL_TABLET | Freq: Two times a day (BID) | ORAL | Status: DC
  Administered 2020-09-03 – 2020-09-08 (×11): 550 mg via ORAL
  Filled 2020-09-03 (×11): qty 1

## 2020-09-03 MED ORDER — SPIRONOLACTONE 25 MG PO TABS
150.0000 mg | ORAL_TABLET | Freq: Once | ORAL | Status: AC
  Administered 2020-09-03: 150 mg via ORAL
  Filled 2020-09-03: qty 6

## 2020-09-03 MED ORDER — FUROSEMIDE 40 MG PO TABS: 40.0000 mg | ORAL_TABLET | Freq: Every day | ORAL | Status: DC

## 2020-09-03 MED ORDER — ONDANSETRON HCL 4 MG/2ML IJ SOLN
4.0000 mg | Freq: Once | INTRAMUSCULAR | Status: AC
  Administered 2020-09-03: 4 mg via INTRAVENOUS
  Filled 2020-09-03: qty 2

## 2020-09-03 MED ORDER — SPIRONOLACTONE 50 MG PO TABS
150.0000 mg | ORAL_TABLET | Freq: Every day | ORAL | Status: DC
  Filled 2020-09-03 (×2): qty 1

## 2020-09-03 MED ORDER — SODIUM CHLORIDE 0.9 % IV SOLN
2.0000 g | INTRAVENOUS | Status: AC
  Administered 2020-09-04 – 2020-09-08 (×5): 2 g via INTRAVENOUS
  Filled 2020-09-03 (×5): qty 20

## 2020-09-03 MED ORDER — SODIUM CHLORIDE 0.9 % IV SOLN
1.0000 g | Freq: Once | INTRAVENOUS | Status: AC
  Administered 2020-09-03: 1 g via INTRAVENOUS
  Filled 2020-09-03: qty 10

## 2020-09-03 MED ORDER — OXYCODONE HCL 5 MG PO TABS
5.0000 mg | ORAL_TABLET | ORAL | Status: DC | PRN
  Administered 2020-09-03 – 2020-09-08 (×24): 5 mg via ORAL
  Filled 2020-09-03 (×24): qty 1

## 2020-09-03 MED ORDER — ALBUMIN HUMAN 25 % IV SOLN
50.0000 g | Freq: Once | INTRAVENOUS | Status: AC
  Filled 2020-09-03: qty 200

## 2020-09-03 MED ORDER — ALBUMIN HUMAN 25 % IV SOLN
INTRAVENOUS | Status: AC
  Administered 2020-09-03: 50 g via INTRAVENOUS
  Filled 2020-09-03: qty 200

## 2020-09-03 NOTE — H&P (Addendum)
History and Physical  [facility]  Megan Roberson VVK:[id number] DOB: [birth year] DOA: 09/02/2020  PCP: Celene Squibb, MD  Patient coming from: Home   I have personally briefly reviewed patient's old medical records in [place]  Chief Complaint: abdominal pain/distension  HPI: Megan Roberson is a 64 y.o. 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  She reports ports that she had not been taking her Lasix due to problems with hypokalemia.  She reports that subsequently she developed rapidly increasing abdominal distention.  She has been having abdominal pain as well.  She also reports she has not been able to take her lactulose regularly because of difficulty with getting to the bathroom.  She complains of shortness of breath and abdominal distention nominal distention and subsequent abdominal pain.  She had reached out to her gastroenterologist and had planned for an outpatient paracentesis in the next 1 to 2 days however her symptoms became so severe that she could not wait and she presented to the emergency department for some type of treatment.  ED Course: Patient arrived afebrile with a pulse of 93 blood pressure 142/88 ammonia level 43, SARS 2 coronavirus test negative, influenza AMB test negative, sodium 136, potassium 3.7, glucose 74, creatinine 0.55, calcium 7.9, AST 42,, ALT 27, WBC 8.9, hemoglobin 8.1, platelet count 47.  Hematocrit was 28.3.  Urinalysis: Cloudy with large WBC and large RBC.  CT of the abdomen pelvis with no significant acute findings.  X-rays with findings of gaseous distention of the colon no other acute findings.  The  patient was noted to have markedly distended abdomen that was tamponaded with fluid waves.  The patient was admitted for further further management. . Review of Systems: As per HPI otherwise 10 point review of systems negative.   Past Medical History:  Diagnosis Date  . Anxiety    pt. denies  . Arthritis   . Cirrhosis of liver ([place])   . Complication of anesthesia    ileus after surgery 2018  . Depression    "not all the time"  . Heart murmur    Echo 10/06/17 [facility]): Normal LV/RV size, basal septal hypertophy-normal variant, LVEF 65-70%, normal LV/RV function, est RAP 5 mmHg, no sign valvular stenosis or regurg--trace MR/TR/PR  . History of abdominal paracentesis   . History of blood transfusion   . Hypothyroidism   . Ileus ([place])   . Liver disease 2008  . Neuropathy   . Osteoporosis 02/09/2018  . Pancytopenia ([place])   . Pneumonia    2019ish  . Thrombocytopenia ([place]) 12/01/2019  . Vaginal Pap smear, abnormal     Past Surgical History:  Procedure Laterality Date  . AUGMENTATION MAMMAPLASTY Bilateral    20 years ago  . BIOPSY  03/15/2018   Procedure: BIOPSY;  Surgeon: Yetta Flock, MD;  Location: [facility];  Service: Gastroenterology;;  Gastric  . COLONOSCOPY WITH ESOPHAGOGASTRODUODENOSCOPY (EGD) AND ESOPHAGEAL DILATION (ED)    . COLONOSCOPY WITH PROPOFOL N/A 03/15/2018   Procedure: COLONOSCOPY WITH PROPOFOL;  Surgeon: Yetta Flock, MD;  Location: [facility];  Service: Gastroenterology;  Laterality: N/A;  . ESOPHAGOGASTRODUODENOSCOPY (EGD) WITH PROPOFOL N/A 03/15/2018   Procedure: ESOPHAGOGASTRODUODENOSCOPY (EGD) WITH PROPOFOL;  Surgeon: Yetta Flock, MD;  Location: [facility];  Service: Gastroenterology;  Laterality: N/A;  . ESOPHAGOGASTRODUODENOSCOPY W/ BANDING     Varices  . FRACTURE SURGERY Left    x 2 L elbow and hip left   . HARDWARE REMOVAL Left 09/17/2017   Procedure: HARDWARE REMOVAL LEFT OLECRANON;  Surgeon: Altamese Crimora, MD;  Location:  [place];  Service: Orthopedics;  Laterality: Left;  . HIP SURGERY Left    from fracture- rod in palce  . IR PARACENTESIS  11/18/2017  . IR PARACENTESIS  11/26/2017  . IR PARACENTESIS  03/14/2018  . IR PARACENTESIS  08/08/2020  . KNEE SURGERY Right    open incision for ligaments  . ORIF ELBOW FRACTURE Left 04/30/2017   Procedure: REVISION OPEN REDUCTION INTERNAL FIXATION (ORIF) ELBOW/OLECRANON FRACTURE;  Surgeon: Altamese Shellman, MD;  Location: [place];  Service: Orthopedics;  Laterality: Left;  . REPAIR EXTENSOR TENDON Left 12/29/2019   Procedure: LEFT LONG AND LEFT RING FRINGER EXTENSOR REALIGNAMENT WITH POSSIBLE TENDON TRANSFER;  Surgeon: Charlotte Crumb, MD;  Location: [place];  Service: Orthopedics;  Laterality: Left;     reports that she has never smoked. She has never used smokeless tobacco. She reports that she does not drink alcohol and does not use drugs.  Allergies  Allergen Reactions  . Penicillins Swelling and Other (See Comments)    FACIAL SWELLING Did it involve swelling of the face/tongue/throat, SOB, or low BP? Yes Did it involve sudden or severe rash/hives, skin peeling, or any reaction on the inside of your mouth or nose? No Did you need to seek medical attention at a hospital or doctor's office? Unknown When did it last happen?30 years If all above answers are "NO", may proceed with cephalosporin use.   . Tylenol [Acetaminophen] Nausea Only  . Sulfa Antibiotics Itching and Rash  . Tramadol Swelling and Rash    Family History  Adopted: Yes  Problem Relation Age of Onset  . Alcohol abuse Daughter      Prior to Admission medications   Medication Sig Start Date End Date Taking? Authorizing Provider  diclofenac Sodium (VOLTAREN) 1 % GEL Apply 2 g topically daily as needed (for pain).  04/23/20   [provider]  furosemide (LASIX) 40 MG tablet Take 1 tablet (40 mg total) by mouth daily. 08/21/20   Nita Sells, MD  lactulose (CHRONULAC) 10 GM/15ML  solution Take 30 mLs (20 g total) by mouth 3 (three) times daily. 08/21/20   Nita Sells, MD  levothyroxine (SYNTHROID, LEVOTHROID) 75 MCG tablet Take 1 tablet (75 mcg total) by mouth daily before breakfast. 12/02/17   Aline August, MD  omeprazole (PRILOSEC) 20 MG capsule Take 1 capsule (20 mg total) by mouth daily. 08/06/20   Willia Craze, NP  ondansetron (ZOFRAN ODT) 4 MG disintegrating tablet Take 1 tablet (4 mg total) by mouth every 8 (eight) hours as needed for nausea or vomiting. 08/06/20   Willia Craze, NP  oxyCODONE (OXY IR/ROXICODONE) 5 MG immediate release tablet Take 1 tablet (5 mg total) by mouth every 12 (twelve) hours as needed for severe pain. 08/21/20   Nita Sells, MD  rifaximin (XIFAXAN) 550 MG TABS tablet Take 1 tablet (550 mg total) by mouth 2 (two) times daily. 05/21/20   Roxan Hockey, MD  sertraline (ZOLOFT) 50 MG tablet Take 1 tablet (50 mg total) by mouth daily. 08/21/20   Nita Sells, MD  spironolactone (ALDACTONE) 50 MG tablet Take 3 tablets (150 mg total) by mouth daily. 08/21/20   Nita Sells, MD  traZODone (DESYREL) 50  MG tablet Take 1 tablet (50 mg total) by mouth at bedtime as needed for sleep. 08/21/20   Nita Sells, MD    Physical Exam: Vitals:   09/02/20 2347 09/03/20 0700  BP: (!) 142/88 115/66  Pulse: 93   Resp: 20 16  Temp: 98.6 F (37 C)   TempSrc: Oral   SpO2: 96%   Weight: 54 kg   Height: 5' 7"  (1.702 m)    Constitutional: Chronically ill-appearing frail elderly female, NAD, calm, comfortable Eyes: PERRL, lids and conjunctivae normal ENMT: Mucous membranes are moist. Posterior pharynx clear of any exudate or lesions.Normal dentition.  Neck: normal, supple, no masses, no thyromegaly Respiratory: Bilateral breath sounds clear with shallow breathing noted, diminished breath sounds in the left lower lobe, no wheezing, no crackles. Normal respiratory effort. No accessory muscle use.   Cardiovascular: Normal S1-S2 sounds, no murmurs / rubs / gallops. No extremity edema. 2+ pedal pulses. No carotid bruits.  Abdomen: Markedly distended, tamponaded with fluid waves.  Hypoactive bowel sounds. Musculoskeletal: left foot in cast, no clubbing / cyanosis. No joint deformity upper and lower extremities. Good ROM, no contractures. Normal muscle tone.  Skin: Diffuse bruising noted, very thin skin. Neurologic: CN 2-12 grossly intact. Sensation intact, DTR normal. Strength 5/5 in all 4.  Psychiatric:  Alert and oriented x 3. Normal mood.   Labs on Admission: I have personally reviewed following labs and imaging studies  CBC: Recent Labs  Lab 09/03/20 0015  WBC 8.9  NEUTROABS 6.6  HGB 8.1*  HCT 28.3*  MCV 87.9  PLT 47*   Basic Metabolic Panel: Recent Labs  Lab 09/03/20 0015  NA 136  K 3.7  CL 109  CO2 22  GLUCOSE 74  BUN 9  CREATININE 0.55  CALCIUM 7.9*   GFR: Estimated Creatinine Clearance: 63 mL/min (by C-G formula based on SCr of 0.55 mg/dL). Liver Function Tests: Recent Labs  Lab 09/03/20 0015  AST 42*  ALT 27  ALKPHOS 115  BILITOT 2.5*  PROT 6.0*  ALBUMIN 2.6*   Recent Labs  Lab 09/03/20 0015  LIPASE 28   No results for input(s): AMMONIA in the last 168 hours. Coagulation Profile: Recent Labs  Lab 09/03/20 0015  INR 1.5*   Cardiac Enzymes: No results for input(s): CKTOTAL, CKMB, CKMBINDEX, TROPONINI in the last 168 hours. BNP (last 3 results) No results for input(s): PROBNP in the last 8760 hours. HbA1C: No results for input(s): HGBA1C in the last 72 hours. CBG: No results for input(s): GLUCAP in the last 168 hours. Lipid Profile: No results for input(s): CHOL, HDL, LDLCALC, TRIG, CHOLHDL, LDLDIRECT in the last 72 hours. Thyroid Function Tests: No results for input(s): TSH, T4TOTAL, FREET4, T3FREE, THYROIDAB in the last 72 hours. Anemia Panel: No results for input(s): VITAMINB12, FOLATE, FERRITIN, TIBC, IRON, RETICCTPCT in the last 72  hours. Urine analysis:    Component Value Date/Time   COLORURINE AMBER (A) 09/03/2020 0023   APPEARANCEUR CLOUDY (A) 09/03/2020 0023   LABSPEC 1.028 09/03/2020 0023   PHURINE 5.0 09/03/2020 0023   GLUCOSEU NEGATIVE 09/03/2020 0023   HGBUR LARGE (A) 09/03/2020 0023   BILIRUBINUR NEGATIVE 09/03/2020 0023   KETONESUR NEGATIVE 09/03/2020 0023   PROTEINUR 100 (A) 09/03/2020 0023   NITRITE NEGATIVE 09/03/2020 0023   LEUKOCYTESUR NEGATIVE 09/03/2020 0023    Radiological Exams on Admission: CT ABDOMEN PELVIS W CONTRAST  Result Date: 09/03/2020 CLINICAL DATA:  Abdominal distension, hematuria EXAM: CT ABDOMEN AND PELVIS WITH CONTRAST TECHNIQUE: Multidetector CT imaging of the  abdomen and pelvis was performed using the standard protocol following bolus administration of intravenous contrast. CONTRAST:  60m OMNIPAQUE IOHEXOL 300 MG/ML  SOLN COMPARISON:  08/15/2020 FINDINGS: Lower chest: Ground-glass pulmonary infiltrate within the right lower lobe has improved slightly in the interval since prior examination, likely infectious in etiology. Moderate left pleural effusion is present, increased in size since prior exam. Compressive atelectasis of the left lower lobe noted. Cardiac size within normal limits. Gastroesophageal varices are noted. Hepatobiliary: Cirrhosis. No enhancing intrahepatic mass is clearly identified. There is dilation of the right superior portal vein likely related to an underlying intrahepatic portal venous shunt. Cholelithiasis again noted without obvious pericholecystic inflammatory change, though evaluation is slightly limited by the presence of ascites. No intra or extrahepatic biliary ductal dilation. Pancreas: Unremarkable Spleen: The spleen is enlarged, unchanged from prior examination, measuring 15.6 cm in greatest dimension, in keeping with changes of portal venous hypertension. No intrasplenic lesions are seen. The splenic vein is patent. Adrenals/Urinary Tract: The adrenal  glands are unremarkable. 13 mm x 14 mm nonobstructing calculus is seen within the right renal pelvis. The kidneys are otherwise unremarkable. The bladder is decompressed with a Foley catheter balloon seen within its lumen. Stomach/Bowel: The stomach and small bowel are unremarkable. Previously noted circumferential wall thickening involving the descending and proximal sigmoid colon has resolved suggesting that the findings on prior examination were related to and a infectious or inflammatory process at that time. There has developed mild surf rental thickening, similar to the prior examination, now involving the ascending colon. No evidence of obstruction or perforation. No free intraperitoneal gas. Large volume ascites has progressed since prior examination. Vascular/Lymphatic: Recanalization of the umbilical vein is present in keeping with portosystemic collateralization. Similarly, several and rectal varices are noted within the left lateral wall of the rectum. The abdominal aorta is of normal caliber. No pathologic adenopathy within the abdomen and pelvis. Reproductive: Simple appearing cysts are seen within the adnexa bilaterally, stable since prior examination. There size does not warrant further follow-up. Uterus unremarkable. Other: Bilateral breast implants are partially visualized. Musculoskeletal: Left hip ORIF has been performed. Healed left iliac crest fracture. Compression fractures T12 and superior endplate fracture of L1 are unchanged from prior examination no acute bone abnormality. IMPRESSION: Slight interval improvement in probable infectious pulmonary infiltrate within the right lower lobe. Cirrhosis. 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 Gastroesophageal and perirectal portosystemic collateral varices are identified. Resolved circumferential wall thickening involving the distal colon noted on  prior examination, likely infectious or inflammatory process at that time. New similar appearing mural thickening involving the ascending colon, likely infectious or inflammatory. Given its restricted territory, portal colopathy is considered less likely. Electronically Signed   By: AFidela SalisburyMD   On: 09/03/2020 02:48   DG Abdomen Acute W/Chest  Result Date: 09/03/2020 CLINICAL DATA:  Abdominal pain, shortness of breath EXAM: DG ABDOMEN ACUTE WITH 1 VIEW CHEST COMPARISON:  08/15/2020 FINDINGS: Previously seen 12 mm right renal pelvic stone again noted. There is gaseous distention of the colon, likely mild ileus. No small bowel distention or free air. No organomegaly. Heart is normal size. Scarring at the right lung base. Left lung clear. IMPRESSION: Mild gaseous distention of the colon, likely ileus. Right renal pelvic stone again noted. Right basilar scarring. Electronically Signed   By: KRolm BaptiseM.D.   On: 09/03/2020 00:49   Assessment/Plan Principal Problem:   Ascites Active Problems:  Cirrhosis of liver with ascites (HCC)   Abdominal pain   Thrombocytopenia (HCC)   Malnutrition of moderate degree (HCC)   Osteoporosis   Hypoalbuminemia   Generalized weakness   Hypothyroidism   UTI (urinary tract infection)   Mood disorder ([place])   1. Liver cirrhosis secondary to NASH -decompensated at this time, I am requesting GI consultation for assistance with management.  Unfortunately patient has not been able to take her diuretics as prescribed and has had progressive symptoms requiring further hospitalization.  I am also requesting a palliative medicine consultation for goals of care given high mortality risk. 2. UTI-she has been started on ceftriaxone in the emergency department. 3. Pneumonia - Orderset for pneumonia ordered with IV ceftriaxone and doxycycline.  4. Symptomatic ascites with ascites-patient is being sent to radiology for an ultrasound-guided paracentesis.  Fluid studies  ordered including Gram stain.  Patient has preemptively been started on ceftriaxone for SBP. 5. Thrombocytopenia-severe, holding all heparin's.  Follow CBC.  No active bleed no active bleeding at this time. 6. Hypoalbuminemia-albumin ordered for paracentesis if greater than 5 L of fluid removed. 7. Generalized weakness-multifactorial, request PT evaluation and palliative care consultation. 8. Moderate malnutrition-consult dietitian recommendations. 9. Generalized abdominal pain-secondary to severe uncontrolled ascites.  Treating with paracentesis. 10. Hypothyroidism-resume home levothyroxine. 11. Mood disorder-temporarily holding Zoloft until we can evaluate QT interval. 12. Left displaced foot fracture - Pt was casted in ED on 10/12.  Pt hasn't followed up with orthopedics yet.  The plan was for patient to follow up outpatient with orthopedics after discharge.    DVT prophylaxis: SCDs Code Status: Full pending palliative discussions Family Communication: Plan of care discussed with patient who verbalized understanding Disposition Plan: Home Consults called: Gastroenterology inpatient Admission status: Inpatient  Irwin Brakeman MD [facility] How to contact the [facility] Attending or Consulting provider [place] or covering provider during after hours [place], for this patient?  1. Check the care team in [facility] and look for a) attending/consulting [facility] provider listed and b) the [facility] team listed 2. Log into [email or web address] and use [place]'s universal password to access. If you do not have the password, please contact the hospital operator. 3. Locate the [facility] provider you are looking for under [facility] and page to a number that you can be directly reached. 4. If you still have difficulty reaching the provider, please page the [facility] (Director on Call) for the Hospitalists listed on amion for assistance.   If 7PM-7AM, please contact night-coverage [email or web address] Password  [facility]  09/03/2020, 7:27 AM

## 2020-09-03 NOTE — Progress Notes (Signed)
error 

## 2020-09-03 NOTE — Procedures (Signed)
PreOperative Dx: Cirrhosis, ascites Postoperative Dx: Cirrhosis, ascites Procedure:   US guided paracentesis Radiologist:  Thornton Papas Anesthesia:  10 ml of1% lidocaine Specimen:  5.5 L of clear light yellow ascitic fluid EBL:   < 1 ml Complications: None

## 2020-09-03 NOTE — Telephone Encounter (Signed)
Dr. Havery Moros,  Patient went to the ED yesterday and had US paracentesis and labs today.

## 2020-09-03 NOTE — ED Notes (Addendum)
Pt repositioned in bed for comfort. Linens changed. Brief applied and lighting dimmed per request. Pt denied any other needs at this time. Call bell within reach, bed in low position. Will continue to monitor.

## 2020-09-03 NOTE — Consult Note (Addendum)
. @LOGO @   Referring Provider: [facility]  Primary Care Physician:  Celene Squibb, MD Primary Gastroenterologist: Kank[place]e Cellar, MD ([facility])  Date of Admission: 09/03/20 Date of Consultation: 09/03/20  Reason for Consultation:  Cirrhosis with ascites  HPI:  Megan Roberson is a 64 y.o. year old female with history significant for NASH cirrhosis, followed by [facility], complicated by ascites requiring previous abdominal paracentesis, hepatic encephalopathy, and bleeding esophageal varices. Last EGD completed 03/15/2018 which found trace to small esophageal varices without high risk stigmata that did not undergo banding due to thrombocytopenia and small size.  Nonbleeding small gastric ulcer without stigmata of bleed, and gastritis. Recommended PPI 40 mg once daily. Previously admitted in October 2021 with profound ascites and encephalopathy. She underwent para and diuretics consisted of continuing Aldactone 150 mg daily and resuming Lasix 40 mg daily which she was discharged on. Last seen in the outpatient setting by [facility] prior to hospitalization in October.   She presented to the emergency room 11/2 with abdominal pain and distension that had been progressively worsening over the last several days.  Also reported gross hematuria. In the ED she has remained hemodynamically stable and is afebrile.  WBC within normal limits, hemoglobin stable at 8.1 with microcytic indices, Cr 0.55, AST elevated at 42, albumin low at 2.6, lipase 28, INR stable at 1.5, urinalysis with large amount of hemoglobin, negative leukocytes.  Urine culture in process.  DG abdomen with mild gaseous distention of the colon, likely ileus.  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 No obvious ileus on CT. Home diuretics have been resumed, lactulose and xifaxan continued, and paracentesis has been ordered. GI was consulted to help manage recurrent ascites.   Today:  Came the emergency room due to increasing abdominal distension. This has been progressing slowly since the last hospitalization. Hasn't been taking any diuretic medications as she thought she wasn't supposed to secondary to electrolyte abnormalities.    Abdominal pain has been persistent for the last 15 years. Somewhat more severe on the right side. This is not new. Comes and goes. Seems to radiate from her back which is also chrnic. 10/10 in severity (although she is resting comfortably). Pain has bee worsening with abdominal distension. Sometimes improved by different positions. Pain radiates from her back.   Continues with chronic nausea which is at baseline. No vomiting.   Not taking lactulose routinely because she isn't able to get up to use the bathroom. Taking lactulose once every other day. Has had some mild constipation. Last BM was yesterday before she came to the hospital. Soft but incomplete. Continues to pass gas. No lactulose yesterday. Thinks she is taking a little MiraLAX. No blood in the stool or black stool. Having some difficulty thinking. Thinking is slow. Taking Xifaxan at home, but only once a day.   Hasn't been able to get up out of bed. Falls frequently. Last fall was prior to her last hospitalization. States she has been bed bound since her last hospital discharge. Husband takes care of her at home.   Has shortness of breath. Occasional dry cough. No significant chest pain or palpitations.   Denies alcohol, illicit drug use, or tobacco use. Doesn't know family  history as she was adopted.   Past Medical History:  Diagnosis Date  .  Anxiety    pt. denies  . Arthritis   . Cirrhosis of liver ([place])   . Complication of anesthesia    ileus after surgery 2018  . Depression    "not all the time"  . Heart murmur    Echo 10/06/17 [facility]): Normal LV/RV size, basal septal hypertophy-normal variant, LVEF 65-70%, normal LV/RV function, est RAP 5 mmHg, no sign valvular stenosis or regurg--trace MR/TR/PR  . History of abdominal paracentesis   . History of blood transfusion   . Hypothyroidism   . Ileus ([place])   . Liver disease 2008  . Neuropathy   . Osteoporosis 02/09/2018  . Pancytopenia ([place])   . Pneumonia    2019ish  . Thrombocytopenia ([place]) 12/01/2019  . Vaginal Pap smear, abnormal     Past Surgical History:  Procedure Laterality Date  . AUGMENTATION MAMMAPLASTY Bilateral    20 years ago  . BIOPSY  03/15/2018   Procedure: BIOPSY;  Surgeon: Yetta Flock, MD;  Location: [facility];  Service: Gastroenterology;;  Gastric  . COLONOSCOPY WITH ESOPHAGOGASTRODUODENOSCOPY (EGD) AND ESOPHAGEAL DILATION (ED)    . COLONOSCOPY WITH PROPOFOL N/A 03/15/2018   Procedure: COLONOSCOPY WITH PROPOFOL;  Surgeon: Yetta Flock, MD;  Location: [facility];  Service: Gastroenterology;  Laterality: N/A;  . ESOPHAGOGASTRODUODENOSCOPY (EGD) WITH PROPOFOL N/A 03/15/2018   Procedure: ESOPHAGOGASTRODUODENOSCOPY (EGD) WITH PROPOFOL;  Surgeon: Yetta Flock, MD;  Location: [facility];  Service: Gastroenterology;  Laterality: N/A;  . ESOPHAGOGASTRODUODENOSCOPY W/ BANDING     Varices  . FRACTURE SURGERY Left    x 2 L elbow and hip left   . HARDWARE REMOVAL Left 09/17/2017   Procedure: HARDWARE REMOVAL LEFT OLECRANON;  Surgeon: Altamese Plain, MD;  Location: [facility];  Service: Orthopedics;  Laterality: Left;  . HIP SURGERY Left    from fracture- rod in palce  . IR PARACENTESIS  11/18/2017  . IR PARACENTESIS  11/26/2017  . IR PARACENTESIS  03/14/2018  . IR PARACENTESIS  08/08/2020  . KNEE SURGERY Right    open incision for  ligaments  . ORIF ELBOW FRACTURE Left 04/30/2017   Procedure: REVISION OPEN REDUCTION INTERNAL FIXATION (ORIF) ELBOW/OLECRANON FRACTURE;  Surgeon: Altamese Salida, MD;  Location: [facility];  Service: Orthopedics;  Laterality: Left;  . REPAIR EXTENSOR TENDON Left 12/29/2019   Procedure: LEFT LONG AND LEFT RING FRINGER EXTENSOR REALIGNAMENT WITH POSSIBLE TENDON TRANSFER;  Surgeon: Charlotte Crumb, MD;  Location: [place];  Service: Orthopedics;  Laterality: Left;    Prior to Admission medications   Medication Sig Start Date End Date Taking? Authorizing Provider  diclofenac Sodium (VOLTAREN) 1 % GEL Apply 2 g topically daily as needed (for pain).  04/23/20  Yes [provider]  lactulose (CHRONULAC) 10 GM/15ML solution Take 30 mLs (20 g total) by mouth 3 (three) times daily. Patient taking differently: Take 20 g by mouth daily.  08/21/20  Yes Nita Sells, MD  levothyroxine (SYNTHROID, LEVOTHROID) 75 MCG tablet Take 1 tablet (75 mcg total) by mouth daily before breakfast. 12/02/17  Yes Aline August, MD  omeprazole (PRILOSEC) 20 MG capsule Take 1 capsule (20 mg total) by mouth daily. 08/06/20  Yes Willia Craze, NP  oxyCODONE (OXY IR/ROXICODONE) 5 MG immediate release tablet Take 1 tablet (5 mg total) by mouth every 12 (twelve) hours as needed for severe pain. 08/21/20  Yes Nita Sells, MD  rifaximin (XIFAXAN) 550 MG TABS tablet Take  1 tablet (550 mg total) by mouth 2 (two) times daily. Patient taking differently: Take 550 mg by mouth 2 (two) times daily. Pt states she take once daily 05/21/20  Yes Emokpae, Courage, MD  sertraline (ZOLOFT) 100 MG tablet Take 100 mg by mouth daily. 08/17/20  Yes [provider]  sertraline (ZOLOFT) 50 MG tablet Take 1 tablet (50 mg total) by mouth daily. 08/21/20  Yes Nita Sells, MD  traZODone (DESYREL) 50 MG tablet Take 1 tablet (50 mg total) by mouth at bedtime as needed for sleep. 08/21/20  Yes Nita Sells, MD   furosemide (LASIX) 40 MG tablet Take 1 tablet (40 mg total) by mouth daily. Patient not taking: Reported on 09/03/2020 08/21/20   Nita Sells, MD  ondansetron (ZOFRAN ODT) 4 MG disintegrating tablet Take 1 tablet (4 mg total) by mouth every 8 (eight) hours as needed for nausea or vomiting. 08/06/20   Willia Craze, NP  spironolactone (ALDACTONE) 50 MG tablet Take 3 tablets (150 mg total) by mouth daily. Patient not taking: Reported on 09/03/2020 08/21/20   Nita Sells, MD    Current Facility-Administered Medications  Medication Dose Route Frequency Provider Last Rate Last Admin  . furosemide (LASIX) tablet 40 mg  40 mg Oral Daily Johnson, Clanford L, MD      . lactulose (CHRONULAC) 10 GM/15ML solution 20 g  20 g Oral TID Johnson, Clanford L, MD      . levothyroxine (SYNTHROID) tablet 75 mcg  75 mcg Oral QAC breakfast Johnson, Clanford L, MD      . oxyCODONE (Oxy IR/ROXICODONE) immediate release tablet 5 mg  5 mg Oral Q4H PRN Johnson, Clanford L, MD      . rifaximin (XIFAXAN) tablet 550 mg  550 mg Oral BID Johnson, Clanford L, MD      . spironolactone (ALDACTONE) tablet 150 mg  150 mg Oral Daily Johnson, Clanford L, MD       Current Outpatient Medications  Medication Sig Dispense Refill  . diclofenac Sodium (VOLTAREN) 1 % GEL Apply 2 g topically daily as needed (for pain).     Marland Kitchen lactulose (CHRONULAC) 10 GM/15ML solution Take 30 mLs (20 g total) by mouth 3 (three) times daily. (Patient taking differently: Take 20 g by mouth daily. ) 236 mL 0  . levothyroxine (SYNTHROID, LEVOTHROID) 75 MCG tablet Take 1 tablet (75 mcg total) by mouth daily before breakfast. 30 tablet 0  . omeprazole (PRILOSEC) 20 MG capsule Take 1 capsule (20 mg total) by mouth daily. 30 capsule 5  . oxyCODONE (OXY IR/ROXICODONE) 5 MG immediate release tablet Take 1 tablet (5 mg total) by mouth every 12 (twelve) hours as needed for severe pain. 10 tablet 0  . rifaximin (XIFAXAN) 550 MG TABS tablet Take 1  tablet (550 mg total) by mouth 2 (two) times daily. (Patient taking differently: Take 550 mg by mouth 2 (two) times daily. Pt states she take once daily) 60 tablet 5  . sertraline (ZOLOFT) 100 MG tablet Take 100 mg by mouth daily.    . sertraline (ZOLOFT) 50 MG tablet Take 1 tablet (50 mg total) by mouth daily. 4 tablet 0  . traZODone (DESYREL) 50 MG tablet Take 1 tablet (50 mg total) by mouth at bedtime as needed for sleep. 4 tablet 0  . furosemide (LASIX) 40 MG tablet Take 1 tablet (40 mg total) by mouth daily. (Patient not taking: Reported on 09/03/2020) 30 tablet   . ondansetron (ZOFRAN ODT) 4 MG disintegrating tablet Take 1  tablet (4 mg total) by mouth every 8 (eight) hours as needed for nausea or vomiting. 40 tablet 3  . spironolactone (ALDACTONE) 50 MG tablet Take 3 tablets (150 mg total) by mouth daily. (Patient not taking: Reported on 09/03/2020)      Allergies as of 09/02/2020 - Review Complete 09/02/2020  Allergen Reaction Noted  . Penicillins Swelling and Other (See Comments) 04/20/2017  . Tylenol [acetaminophen] Nausea Only 01/18/2018  . Sulfa antibiotics Itching and Rash 04/20/2017  . Tramadol Swelling and Rash 05/20/2018    Family History  Adopted: Yes  Problem Relation Age of Onset  . Alcohol abuse Daughter     Social History   Socioeconomic History  . Marital status: Married    Spouse name: Not on file  . Number of children: 2  . Years of education: Not on file  . Highest education level: Not on file  Occupational History  . Occupation: disabled  Tobacco Use  . Smoking status: Never Smoker  . Smokeless tobacco: Never Used  Vaping Use  . Vaping Use: Never used  Substance and Sexual Activity  . Alcohol use: No  . Drug use: No  . Sexual activity: Not Currently    Birth control/protection: Post-menopausal  Other Topics Concern  . Not on file  Social History Narrative  . Not on file   Social Determinants of Health   Financial Resource Strain:   .  Difficulty of Paying Living Expenses: Not on file  Food Insecurity:   . Worried About Charity fundraiser in the Last Year: Not on file  . Ran Out of Food in the Last Year: Not on file  Transportation Needs:   . Lack of Transportation (Medical): Not on file  . Lack of Transportation (Non-Medical): Not on file  Physical Activity:   . Days of Exercise per Week: Not on file  . Minutes of Exercise per Session: Not on file  Stress:   . Feeling of Stress : Not on file  Social Connections:   . Frequency of Communication with Friends and Family: Not on file  . Frequency of Social Gatherings with Friends and Family: Not on file  . Attends Religious Services: Not on file  . Active Member of Clubs or Organizations: Not on file  . Attends Archivist Meetings: Not on file  . Marital Status: Not on file  Intimate Partner Violence:   . Fear of Current or Ex-Partner: Not on file  . Emotionally Abused: Not on file  . Physically Abused: Not on file  . Sexually Abused: Not on file    Review of Systems: Gen: Denies fever, chills, lightheadedness, dizziness, presyncope, syncope. CV: Denies chest pain, heart palpitations Resp: Admits to shortness of breath and intermittent dry cough. GI: See HPI MSK: Chronic back pain,  Derm: Positive for easy bruising.  Heme: See HPI.  Physical Exam: Vital signs in last 24 hours: Temp:  [98.6 F (37 C)] 98.6 F (37 C) (11/01 2347) Pulse Rate:  [93] 93 (11/01 2347) Resp:  [16-20] 16 (11/02 0700) BP: (115-142)/(66-88) 115/66 (11/02 0700) SpO2:  [96 %] 96 % (11/01 2347) Weight:  [54 kg] 54 kg (11/01 2347)   General:   Alert, pleasant and cooperative in NAD, chronically ill appearing.  Head:  Normocephalic and atraumatic. Eyes:  Sclera clear, no icterus. Conjunctiva pink. Ears:  Normal auditory acuity. Lungs:  Clear throughout to auscultation. No wheezes, crackles, or rhonchi. No acute distress. Heart:  Regular rate and rhythm; no murmurs,  clicks,  rubs, or gallops. Abdomen: +BS, distended with moderately tense ascites and generalized tenderness to palpation without guarding or rebound.  No masses.unable to assess well for hepatosplenomegaly.  No hernias noted.  Rectal:  Deferred Msk:  Symmetrical without gross deformities. Normal posture. Extremities:  Without edema. Has was looks like a temporary cast on left foot/ankle. Per patient, this was placed during last admission. Has not see ortho.  Neurologic:  Alert and  oriented x4;  grossly normal neurologically. No asterixis.  Skin:  Bruising noted on arms and right upper back.  Psych:  Normal mood and affect.  Intake/Output from previous day: No intake/output data recorded. Intake/Output this shift: No intake/output data recorded.  Lab Results: Recent Labs    09/03/20 0015  WBC 8.9  HGB 8.1*  HCT 28.3*  PLT 47*   BMET Recent Labs    09/03/20 0015  NA 136  K 3.7  CL 109  CO2 22  GLUCOSE 74  BUN 9  CREATININE 0.55  CALCIUM 7.9*   LFT Recent Labs    09/03/20 0015  PROT 6.0*  ALBUMIN 2.6*  AST 42*  ALT 27  ALKPHOS 115  BILITOT 2.5*   PT/INR Recent Labs    09/03/20 0015  LABPROT 17.7*  INR 1.5*    Studies/Results: CT ABDOMEN PELVIS W CONTRAST  Result Date: 09/03/2020 CLINICAL DATA:  Abdominal distension, hematuria EXAM: CT ABDOMEN AND PELVIS WITH CONTRAST TECHNIQUE: Multidetector CT imaging of the abdomen and pelvis was performed using the standard protocol following bolus administration of intravenous contrast. CONTRAST:  84m OMNIPAQUE IOHEXOL 300 MG/ML  SOLN COMPARISON:  08/15/2020 FINDINGS: Lower chest: Ground-glass pulmonary infiltrate within the right lower lobe has improved slightly in the interval since prior examination, likely infectious in etiology. Moderate left pleural effusion is present, increased in size since prior exam. Compressive atelectasis of the left lower lobe noted. Cardiac size within normal limits. Gastroesophageal varices are  noted. Hepatobiliary: Cirrhosis. No enhancing intrahepatic mass is clearly identified. There is dilation of the right superior portal vein likely related to an underlying intrahepatic portal venous shunt. Cholelithiasis again noted without obvious pericholecystic inflammatory change, though evaluation is slightly limited by the presence of ascites. No intra or extrahepatic biliary ductal dilation. Pancreas: Unremarkable Spleen: The spleen is enlarged, unchanged from prior examination, measuring 15.6 cm in greatest dimension, in keeping with changes of portal venous hypertension. No intrasplenic lesions are seen. The splenic vein is patent. Adrenals/Urinary Tract: The adrenal glands are unremarkable. 13 mm x 14 mm nonobstructing calculus is seen within the right renal pelvis. The kidneys are otherwise unremarkable. The bladder is decompressed with a Foley catheter balloon seen within its lumen. Stomach/Bowel: The stomach and small bowel are unremarkable. Previously noted circumferential wall thickening involving the descending and proximal sigmoid colon has resolved suggesting that the findings on prior examination were related to and a infectious or inflammatory process at that time. There has developed mild surf rental thickening, similar to the prior examination, now involving the ascending colon. No evidence of obstruction or perforation. No free intraperitoneal gas. Large volume ascites has progressed since prior examination. Vascular/Lymphatic: Recanalization of the umbilical vein is present in keeping with portosystemic collateralization. Similarly, several and rectal varices are noted within the left lateral wall of the rectum. The abdominal aorta is of normal caliber. No pathologic adenopathy within the abdomen and pelvis. Reproductive: Simple appearing cysts are seen within the adnexa bilaterally, stable since prior examination. There size does not warrant further follow-up. Uterus unremarkable.  Other:  Bilateral breast implants are partially visualized. Musculoskeletal: Left hip ORIF has been performed. Healed left iliac crest fracture. Compression fractures T12 and superior endplate fracture of L1 are unchanged from prior examination no acute bone abnormality. IMPRESSION: Slight interval improvement in probable infectious pulmonary infiltrate within the right lower lobe. Cirrhosis. 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 Gastroesophageal and perirectal portosystemic collateral varices are identified. Resolved circumferential wall thickening involving the distal colon noted on prior examination, likely infectious or inflammatory process at that time. New similar appearing mural thickening involving the ascending colon, likely infectious or inflammatory. Given its restricted territory, portal colopathy is considered less likely. Electronically Signed   By: Fidela Salisbury MD   On: 09/03/2020 02:48   DG Abdomen Acute W/Chest  Result Date: 09/03/2020 CLINICAL DATA:  Abdominal pain, shortness of breath EXAM: DG ABDOMEN ACUTE WITH 1 VIEW CHEST COMPARISON:  08/15/2020 FINDINGS: Previously seen 12 mm right renal pelvic stone again noted. There is gaseous distention of the colon, likely mild ileus. No small bowel distention or free air. No organomegaly. Heart is normal size. Scarring at the right lung base. Left lung clear. IMPRESSION: Mild gaseous distention of the colon, likely ileus. Right renal pelvic stone again noted. Right basilar scarring. Electronically Signed   By: Rolm Baptise M.D.   On: 09/03/2020 00:49    Impression: 64 y.o. year old female with history significant for NASH cirrhosis, followed by [facility], complicated by significant thrombocytopenia, ascites requiring abdominal paracentesis, hepatic encephalopathy, and bleeding esophageal varices.  Last EGD completed 03/15/2018 which found trace to  small esophageal varices without high risk stigmata that did not undergo banding due to thrombocytopenia and small size.  Nonbleeding small gastric ulcer without stigmata of bleed, and gastritis. Recommended PPI 40 mg once daily. Previously admitted in October 2021 with profound ascites and encephalopathy. She underwent para and diuretics consisted of continuing Aldactone 150 mg daily and resuming Lasix 40 mg daily which she was discharged on. She presented to the emergency room 11/2 with increasing abdominal distension, mildly worsened chronic abdominal pain, and hematuria. Laboratory findings at baseline with MELD 15 (stable). No leukocytosis. DG abdomen with mild gaseous distention of the colon, likely ileus.  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 No obvious ileus on CT.   Ascites: Abdomen is distended and tight today with moderate to severe generalized tenderness to palpation. Significant ascites re-accumulation  is likely secondary to diuretic non-compliance. She has not been taking Lasix or Aldactone at home. No peripheral edema. Portal Vein is patent on CT. She is undergoing paracentesis today and has been restarted on home diuretics. Will hold diuretics today as she is receiving para and resume diuretics tomorrow. Will follow-up on para labs to rule out SBP.   Encephalppathy: Ammonia slightly elevated today at 43. No overt encephalopathy. Reports some difficulty/slow thinking but is alert and oriented x4 today. No asterixis on exam. Chronically prescribed Xifaxan BID and lactulose but she has only been taking  Xifaxan once daily and lactulose every other day. We will need to get her back on her daily regimen.   Abdominal Pain: Patient reports chronic generalized abdominal pain that has been  worsening with increased abdominal distension. Suspect significant ascites is playing a role. Para planned for today and will evaluate for SBP.  DG abdomen with possible ileus.  However, no ileus noted on CT.  Additionally, patient had BM yesterday and continues passing gas today.  CT also with resolved circumferential wall thickening involving distal colon noted on prior examination in October, new similar-appearing thickening involving the ascending colon.  She has no diarrhea to suggest infectious colitis.  Query whether portal colopathy is playing a role in the CT findings.  Colonoscopy in 2019 with redundant colon with significant looping, significant portal colopathy, internal hemorrhoids. Will follow-up on para labs and discuss role of repeat colonoscopy with Dr. Jenetta Downer.   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  This is in setting of increased/large volume ascites likely due to noncompliance.  She does report some shortness of breath but her O2 saturation is 98% on room air.  Notably, she does also have slight interval improvement in probable infectious pulmonary infiltrate in the right lower lobe which may also be contributing to shortness of breath.  Respiratory panel is negative for SARS coronavirus 2 and influenza A/B.  Discussed with Dr. Jenetta Downer.  Plans for repeat chest x-ray.  Stated if only a small amount of fluid, will plan for aggressive diuresis.   Plan: 1.  Paracentesis today with cell count, culture, Gram stain.  She will receive 50 g of IV albumin with para. 2.  Resume home diuretics tomorrow with Lasix 40 mg daily and spironolactone 150 mg daily.  Notably, this is not typical ratio; however, Aldactone had increased due to history of  hypokalemia. 3.  Continue Xifaxan twice daily. 4.  Continue lactulose 3 times daily for now with goal of 3-4 BMs daily. 5.  Monitor for hepatic encephalopathy. 6.  Chest x-ray today to evaluate left pleural effusion. 7.  We will discuss abnormal colonic findings on CT with Dr. Jenetta Downer.  May need colonoscopy to further evaluate.  Historically, this was recommended to be completed outpatient. 8.  Follow low sodium diet. No more than 200 mg daily.  9.  Continue supportive measures. 10.  Will need EGD for variceal surveillance outpatient with primary GI.    LOS: 0 days    09/03/2020, 8:04 AM   Aliene Altes, PA-C [facility]   Gastroenterology attending attestation note: Agree with the below findings as written. The patient was presented to me by the APP (Advanced Practice Provider) I personally reviewed the medical chart and evaluated the patient. I personally evaluated and examined the patient by myself.  I agree with Megan Roberson's H/P and assessment.  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  Notably, the patient was hospitalized at [facility] in October 2021 for which she underwent paracentesis and had to be discharged on increased dose of diuretics, with Aldactone 150 mg every day and Lasix 40 mg daily.  Nevertheless, patient did not take his diuretics as she was unsure if she was supposed to restart them as she has had episodes of hypokalemia in the past.  Reports that she was not taking the lactulose as she had some episodes of fecal soiling but was taking her Xifaxan once a day.  She noticed worsening abdominal distention since her last discharge. Also has  had chronic abdominal pain diffusely in her abdomen which  worsened after her abdomen got more distended.  The patient was found to have anemia of 8.1 which was at her baseline with chronic thrombocytopenia 47,000, also was found to have normal renal function and INR 1.5.  Ammonia was 43.  Patient underwent paracentesis with removal of 5.5 L, it was negative for SBP.  Patient reported having mild improvement of her symptoms but still having diffuse abdominal tenderness.  CT of the abdomen with IV contrast showed presence of right lower lobe infiltrate concerning for previous pneumonia which is improved compared to prior, there was also presence of a left pleural effusion and also presence of circumferential wall thickening of the distal colon.  Chest x-ray was performed which was negative for significant pleural effusion on the left side.  Physical exam, patient is AO x2 but this is disoriented to time.  No presence of asterixis.  There was presence of diffuse abdominal tenderness which was worse in the upper abdomen, there was presence of a distended abdomen but was not tense upon palpation, collateral vessels were seen in the abdominal wall.  No lower extremity edema was found. MELD 15.  At this time, the patient should be optimized from the fluid status she is currently presenting, as she has evidence of persistent fluid in her abdomen but also has presence of third spacing in the left pleural space.  We will start her on furosemide 40 mg every day and Aldactone 150 mg daily as she was supposed to start before.  We will need to uptitrate diuretics based on her fluid status and she may even require repeat paracentesis before discharge.  She should continue on her Xifaxan 550 mg twice daily and lactulose for goal of 2-3 bowel movements per day.  We will need to check meld labs on a daily basis.  No need for colonoscopy at this point, but will be considered as an outpatient for evaluation of thickening of her colon unless she has worsening of her abdominal pain.  Time  consideration for EGD for surveillance of varices should be considered.  Maylon Peppers, MD [facility]

## 2020-09-03 NOTE — ED Provider Notes (Signed)
[facility] EMERGENCY DEPARTMENT Provider Note   CSN: [id number] Arrival date & time: 09/02/20  2304     History Chief Complaint  Patient presents with  . Abdominal Pain    Megan Roberson is a 64 y.o. female.  HPI     This is a 64 year old female with a history of cirrhosis, ileus, hypothyroidism who presents with abdominal pain and distention.  Patient reports progressively worsening about abdominal pain and distention over the last several days.  She reports nausea without vomiting.  Last bowel movement was 2 days ago.  She states that the pain is diffuse and crampy.  It is nonradiating.  Rates her pain at 8 out of 10.  She denies dysuria but notes gross hematuria.  She states that she has not had any difficulty urinating.  Denies any fevers.  Does report some ongoing shortness of breath and reports recent history of pneumonia for which she finished a course of antibiotics.  No cough or chest pain.  With hospital admission in October for ongoing ascites and worsening liver failure.  That time she had a therapeutic paracentesis.  She has not followed up for an outpatient therapeutic paracentesis and states "I think I am in need of one."  Past Medical History:  Diagnosis Date  . Anxiety    pt. denies  . Arthritis   . Cirrhosis of liver ([place])   . Complication of anesthesia    ileus after surgery 2018  . Depression    "not all the time"  . Heart murmur    Echo 10/06/17 [facility]): Normal LV/RV size, basal septal hypertophy-normal variant, LVEF 65-70%, normal LV/RV function, est RAP 5 mmHg, no sign valvular stenosis or regurg--trace MR/TR/PR  . History of abdominal paracentesis   . History of blood transfusion   . Hypothyroidism   . Ileus ([place])   . Liver disease 2008  . Neuropathy   . Osteoporosis 02/09/2018  . Pancytopenia ([place])   . Pneumonia    2019ish  . Thrombocytopenia ([place]) 12/01/2019  . Vaginal Pap smear, abnormal     Patient Active Problem List   Diagnosis Date Noted   . Mood disorder ([place]) 08/17/2020  . Right lower lobe pneumonia 08/16/2020  . Asymptomatic bacteriuria   . Hypothyroidism 08/14/2020  . Displaced fracture of medial cuneiform of left foot, initial encounter for closed fracture 08/14/2020  . Failure to thrive in adult 08/14/2020  . Weight loss 08/14/2020  . Acute lower UTI 08/14/2020  . Fall at home, initial encounter 08/14/2020  . Generalized weakness 08/13/2020  . Pain in right knee 06/04/2020  . Pain in left hip 06/04/2020  . Nausea 05/19/2020  . Prolonged QT interval 05/19/2020  . Elevated brain natriuretic peptide (BNP) level 05/19/2020  . Hypoalbuminemia 05/19/2020  . Cough 05/19/2020  . Atelectasis 05/19/2020  . Hypomagnesemia 05/19/2020  . Cysts of both ovaries 06/15/2019  . Closed fracture of left tibial plateau 05/17/18 07/08/2018  . Esophageal varices without bleeding ([place])   . Osteoporosis 02/09/2018  . Colon cancer screening 01/18/2018  . Refractory ascites 11/25/2017  . Cellulitis, abdominal wall 11/25/2017  . SOB (shortness of breath) 11/25/2017  . Malnutrition of moderate degree ([place]) 11/06/2017  . Ogilvie's syndrome   . Abdominal distension   . Abdominal pain   . Liver failure without hepatic coma ([place])   . Thrombocytopenia ([place])   . Closed fracture of lumbar spine without lesion of spinal cord ([place])   . Cirrhosis of liver with ascites ([place]) 04/21/2017  .  Pancytopenia ([place]) 04/21/2017  . History of open reduction and internal fixation (ORIF) procedure 04/21/2017  . Visit for suture removal 04/21/2017  . Left Olecranon fracture 04/21/2017  . Hypokalemia 04/21/2017    Past Surgical History:  Procedure Laterality Date  . AUGMENTATION MAMMAPLASTY Bilateral    20 years ago  . BIOPSY  03/15/2018   Procedure: BIOPSY;  Surgeon: Yetta Flock, MD;  Location: [facility];  Service: Gastroenterology;;  Gastric  . COLONOSCOPY WITH ESOPHAGOGASTRODUODENOSCOPY (EGD) AND ESOPHAGEAL DILATION (ED)    .  COLONOSCOPY WITH PROPOFOL N/A 03/15/2018   Procedure: COLONOSCOPY WITH PROPOFOL;  Surgeon: Yetta Flock, MD;  Location: [facility];  Service: Gastroenterology;  Laterality: N/A;  . ESOPHAGOGASTRODUODENOSCOPY (EGD) WITH PROPOFOL N/A 03/15/2018   Procedure: ESOPHAGOGASTRODUODENOSCOPY (EGD) WITH PROPOFOL;  Surgeon: Yetta Flock, MD;  Location: [facility];  Service: Gastroenterology;  Laterality: N/A;  . ESOPHAGOGASTRODUODENOSCOPY W/ BANDING     Varices  . FRACTURE SURGERY Left    x 2 L elbow and hip left   . HARDWARE REMOVAL Left 09/17/2017   Procedure: HARDWARE REMOVAL LEFT OLECRANON;  Surgeon: Altamese [place]ville, MD;  Location: [place];  Service: Orthopedics;  Laterality: Left;  . HIP SURGERY Left    from fracture- rod in palce  . IR PARACENTESIS  11/18/2017  . IR PARACENTESIS  11/26/2017  . IR PARACENTESIS  03/14/2018  . IR PARACENTESIS  08/08/2020  . KNEE SURGERY Right    open incision for ligaments  . ORIF ELBOW FRACTURE Left 04/30/2017   Procedure: REVISION OPEN REDUCTION INTERNAL FIXATION (ORIF) ELBOW/OLECRANON FRACTURE;  Surgeon: Altamese Leola, MD;  Location: [place];  Service: Orthopedics;  Laterality: Left;  . REPAIR EXTENSOR TENDON Left 12/29/2019   Procedure: LEFT LONG AND LEFT RING FRINGER EXTENSOR REALIGNAMENT WITH POSSIBLE TENDON TRANSFER;  Surgeon: Charlotte Crumb, MD;  Location: [place];  Service: Orthopedics;  Laterality: Left;     OB History    Gravida  2   Para  2   Term  2   Preterm      AB      Living  1     SAB      TAB      Ectopic      Multiple      Live Births  2           Family History  Adopted: Yes  Problem Relation Age of Onset  . Alcohol abuse Daughter     Social History   Tobacco Use  . Smoking status: Never Smoker  . Smokeless tobacco: Never Used  Vaping Use  . Vaping Use: Never used  Substance Use Topics  . Alcohol use: No  . Drug use: No    Home Medications Prior to Admission medications   Medication Sig  Start Date End Date Taking? Authorizing Provider  diclofenac Sodium (VOLTAREN) 1 % GEL Apply 2 g topically daily as needed (for pain).  04/23/20   [provider]  furosemide (LASIX) 40 MG tablet Take 1 tablet (40 mg total) by mouth daily. 08/21/20   Nita Sells, MD  lactulose (CHRONULAC) 10 GM/15ML solution Take 30 mLs (20 g total) by mouth 3 (three) times daily. 08/21/20   Nita Sells, MD  levothyroxine (SYNTHROID, LEVOTHROID) 75 MCG tablet Take 1 tablet (75 mcg total) by mouth daily before breakfast. 12/02/17   Aline August, MD  omeprazole (PRILOSEC) 20 MG capsule Take 1 capsule (20 mg total) by mouth daily. 08/06/20   Willia Craze, NP  ondansetron (ZOFRAN ODT) 4 MG disintegrating tablet Take 1 tablet (4 mg total) by mouth every 8 (eight) hours as needed for nausea or vomiting. 08/06/20   Willia Craze, NP  oxyCODONE (OXY IR/ROXICODONE) 5 MG immediate release tablet Take 1 tablet (5 mg total) by mouth every 12 (twelve) hours as needed for severe pain. 08/21/20   Nita Sells, MD  rifaximin (XIFAXAN) 550 MG TABS tablet Take 1 tablet (550 mg total) by mouth 2 (two) times daily. 05/21/20   Roxan Hockey, MD  sertraline (ZOLOFT) 50 MG tablet Take 1 tablet (50 mg total) by mouth daily. 08/21/20   Nita Sells, MD  spironolactone (ALDACTONE) 50 MG tablet Take 3 tablets (150 mg total) by mouth daily. 08/21/20   Nita Sells, MD  traZODone (DESYREL) 50 MG tablet Take 1 tablet (50 mg total) by mouth at bedtime as needed for sleep. 08/21/20   Nita Sells, MD    Allergies    Penicillins, Tylenol [acetaminophen], Sulfa antibiotics, and Tramadol  Review of Systems   Review of Systems  Constitutional: Negative for fever.  Respiratory: Positive for shortness of breath.   Cardiovascular: Negative for chest pain.  Gastrointestinal: Positive for abdominal pain and nausea. Negative for constipation, diarrhea and vomiting.  Genitourinary:  Positive for hematuria.  All other systems reviewed and are negative.   Physical Exam Updated Vital Signs BP (!) 142/88 (BP Location: Right Arm)   Pulse 93   Temp 98.6 F (37 C) (Oral)   Resp 20   Ht 1.702 m (5' 7" )   Wt 54 kg   SpO2 96%   BMI 18.64 kg/m   Physical Exam Nursing note reviewed.  Constitutional:      Appearance: She is well-developed.     Comments: Chronically ill-appearing  HENT:     Head: Normocephalic and atraumatic.     Mouth/Throat:     Mouth: Mucous membranes are dry.  Eyes:     Pupils: Pupils are equal, round, and reactive to light.  Cardiovascular:     Rate and Rhythm: Normal rate and regular rhythm.     Heart sounds: Normal heart sounds.  Pulmonary:     Effort: Pulmonary effort is normal. No respiratory distress.     Breath sounds: No wheezing.  Abdominal:     General: Bowel sounds are decreased. There is distension.     Palpations: Abdomen is soft. There is fluid wave.     Tenderness: There is generalized abdominal tenderness.  Musculoskeletal:     Cervical back: Neck supple.  Skin:    General: Skin is warm and dry.  Neurological:     Mental Status: She is alert and oriented to person, place, and time.  Psychiatric:        Mood and Affect: Mood normal.     ED Results / Procedures / Treatments   Labs (all labs ordered are listed, but only abnormal results are displayed) Labs Reviewed  CBC WITH DIFFERENTIAL/PLATELET - Abnormal; Notable for the following components:      Result Value   RBC 3.22 (*)    Hemoglobin 8.1 (*)    HCT 28.3 (*)    MCH 25.2 (*)    MCHC 28.6 (*)    RDW 18.6 (*)    Platelets 47 (*)    Monocytes Absolute 1.2 (*)    Abs Immature Granulocytes 0.23 (*)    All other components within normal limits  COMPREHENSIVE METABOLIC PANEL - Abnormal; Notable for the following components:   Calcium 7.9 (*)  Total Protein 6.0 (*)    Albumin 2.6 (*)    AST 42 (*)    Total Bilirubin 2.5 (*)    All other components within  normal limits  URINALYSIS, ROUTINE W REFLEX MICROSCOPIC - Abnormal; Notable for the following components:   Color, Urine AMBER (*)    APPearance CLOUDY (*)    Hgb urine dipstick LARGE (*)    Protein, ur 100 (*)    RBC / HPF >50 (*)    WBC, UA >50 (*)    Non Squamous Epithelial 0-5 (*)    All other components within normal limits  URINE CULTURE  RESPIRATORY PANEL BY RT PCR (FLU A&B, COVID)  LIPASE, BLOOD  PROTIME-INR    EKG None  Radiology CT ABDOMEN PELVIS W CONTRAST  Result Date: 09/03/2020 CLINICAL DATA:  Abdominal distension, hematuria EXAM: CT ABDOMEN AND PELVIS WITH CONTRAST TECHNIQUE: Multidetector CT imaging of the abdomen and pelvis was performed using the standard protocol following bolus administration of intravenous contrast. CONTRAST:  9m OMNIPAQUE IOHEXOL 300 MG/ML  SOLN COMPARISON:  08/15/2020 FINDINGS: Lower chest: Ground-glass pulmonary infiltrate within the right lower lobe has improved slightly in the interval since prior examination, likely infectious in etiology. Moderate left pleural effusion is present, increased in size since prior exam. Compressive atelectasis of the left lower lobe noted. Cardiac size within normal limits. Gastroesophageal varices are noted. Hepatobiliary: Cirrhosis. No enhancing intrahepatic mass is clearly identified. There is dilation of the right superior portal vein likely related to an underlying intrahepatic portal venous shunt. Cholelithiasis again noted without obvious pericholecystic inflammatory change, though evaluation is slightly limited by the presence of ascites. No intra or extrahepatic biliary ductal dilation. Pancreas: Unremarkable Spleen: The spleen is enlarged, unchanged from prior examination, measuring 15.6 cm in greatest dimension, in keeping with changes of portal venous hypertension. No intrasplenic lesions are seen. The splenic vein is patent. Adrenals/Urinary Tract: The adrenal glands are unremarkable. 13 mm x 14 mm  nonobstructing calculus is seen within the right renal pelvis. The kidneys are otherwise unremarkable. The bladder is decompressed with a Foley catheter balloon seen within its lumen. Stomach/Bowel: The stomach and small bowel are unremarkable. Previously noted circumferential wall thickening involving the descending and proximal sigmoid colon has resolved suggesting that the findings on prior examination were related to and a infectious or inflammatory process at that time. There has developed mild surf rental thickening, similar to the prior examination, now involving the ascending colon. No evidence of obstruction or perforation. No free intraperitoneal gas. Large volume ascites has progressed since prior examination. Vascular/Lymphatic: Recanalization of the umbilical vein is present in keeping with portosystemic collateralization. Similarly, several and rectal varices are noted within the left lateral wall of the rectum. The abdominal aorta is of normal caliber. No pathologic adenopathy within the abdomen and pelvis. Reproductive: Simple appearing cysts are seen within the adnexa bilaterally, stable since prior examination. There size does not warrant further follow-up. Uterus unremarkable. Other: Bilateral breast implants are partially visualized. Musculoskeletal: Left hip ORIF has been performed. Healed left iliac crest fracture. Compression fractures T12 and superior endplate fracture of L1 are unchanged from prior examination no acute bone abnormality. IMPRESSION: Slight interval improvement in probable infectious pulmonary infiltrate within the right lower lobe. Cirrhosis. 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 Gastroesophageal and perirectal portosystemic collateral varices are identified. Resolved circumferential wall thickening involving the distal colon noted on prior examination, likely infectious or  inflammatory  process at that time. New similar appearing mural thickening involving the ascending colon, likely infectious or inflammatory. Given its restricted territory, portal colopathy is considered less likely. Electronically Signed   By: Fidela Salisbury MD   On: 09/03/2020 02:48   DG Abdomen Acute W/Chest  Result Date: 09/03/2020 CLINICAL DATA:  Abdominal pain, shortness of breath EXAM: DG ABDOMEN ACUTE WITH 1 VIEW CHEST COMPARISON:  08/15/2020 FINDINGS: Previously seen 12 mm right renal pelvic stone again noted. There is gaseous distention of the colon, likely mild ileus. No small bowel distention or free air. No organomegaly. Heart is normal size. Scarring at the right lung base. Left lung clear. IMPRESSION: Mild gaseous distention of the colon, likely ileus. Right renal pelvic stone again noted. Right basilar scarring. Electronically Signed   By: Rolm Baptise M.D.   On: 09/03/2020 00:49    Procedures Procedures (including critical care time)  Medications Ordered in ED Medications  cefTRIAXone (ROCEPHIN) 1 g in sodium chloride 0.9 % 100 mL IVPB (has no administration in time range)  morphine 4 MG/ML injection 4 mg (4 mg Intravenous Given 09/03/20 0137)  ondansetron (ZOFRAN) injection 4 mg (4 mg Intravenous Given 09/03/20 0136)  iohexol (OMNIPAQUE) 300 MG/ML solution 100 mL (75 mLs Intravenous Contrast Given 09/03/20 0217)    ED Course  I have reviewed the triage vital signs and the nursing notes.  Pertinent labs & imaging results that were available during my care of the patient were reviewed by me and considered in my medical decision making (see chart for details).    MDM Rules/Calculators/A&P                          Patient presents with abdominal pain and distention.  Also reports hematuria.  She is chronically ill-appearing.  She has a significantly distended abdomen with diffuse tenderness to palpation.  She is afebrile and vital signs are otherwise reassuring.  She reports some  ongoing shortness of breath and recently was treated for pneumonia.  Lab work obtained and patient given pain medication and nausea medication.  KUB questions ileus, which the patient has a history of.  CBC is at the patient's baseline without significant leukocytosis.  Urinalysis shows greater than 50 red and white cells but no bacteria.  Doubt urinary tract infection.  Lipase is 28.  CMP is at the patient's baseline as well.  CT abdomen pelvis obtained and shows no new findings.  She has persistent evidence of portal hypertension and cirrhosis with ascites.  Improved pulmonary infiltrate although she has persistent pleural effusion.  And inflammation of the colon is largely improved.  No obvious obstruction or ileus on CT.  On recheck, she continues to endorse pain.  Suspect she will need a therapeutic paracentesis.  Other consideration is SBP although she is afebrile and has no significant leukocytosis.  Will cover with Rocephin.  Will plan for admission for observation and paracentesis with GI involvement for ongoing fluid management of her ascites. Final Clinical Impression(s) / ED Diagnoses Final diagnoses:  Generalized abdominal pain  Other ascites    Rx / DC Orders ED Discharge Orders    None       Merryl Hacker, MD 09/03/20 [phone]

## 2020-09-03 NOTE — Telephone Encounter (Signed)
Okay, unfortunately looks like she got admitted, she can follow up with me after her discharge. Thanks

## 2020-09-03 NOTE — Consult Note (Signed)
Consultation Note Date: 09/03/20  Patient Name: Megan Roberson  DOB: October 29, 1956  MRN: [id number]  Age / Sex: 64 y.o., female  PCP: Celene Squibb, MD Referring Physician: Murlean Iba, MD  Reason for Consultation: Establishing goals of care  HPI/Patient Profile: 64 y.o. female  with past medical history of NASH cirrhosis, esophageal varices, hepatic encephalopathy, osteoarthritis, anxiety, depression, peripheral neuropathy, hypothyroidism, thrombocytopenia admitted on 09/02/2020 with abdominal distension x3 days. Patient reports she has not been taking lasix due to hypokalemia and not taking lactulose regularly due to difficulty getting to the bathroom. Outpatient GI was planning paracentesis but unfortunately patient presented to ER with worsening symptoms. Found to have significant ascites s/p paracentesis with 5.5L fluid removed. CT abdomen/pelvis shows gaseous distention of colon without acute findings. GI following. Likely will need outpatient colonoscopy and EGD. Palliative medicine consultation for goals of care.   Clinical Assessment and Goals of Care: PMT consult received and chart reviewed.   Met with patient while holding in the ER. She recently had paracentesis but still complains of significant abdominal pain not relieved by prn oxycodone. Patient is restless and uncomfortable in the stretcher. RN to give Morphine 20m IV x1 now.   No family at bedside. Important to patient for husband, TNicole Kindredto be present for GSt. Clouddiscussion. Reassured her that I will call her husband to arrange GOlcottmeeting for tomorrow 11/3. Also with hopes she will be more comfortable and able to participate in discussion.   Briefly, patient was diagnosed with cirrhosis many years ago. [place] She is unsure if she has been evaluated by liver  specialist.   Discussed plan of care and medical management. Explained importance of discussing goals/wishes with her husband while she is cognizant to make decisions.   **Call placed to husband, TNicole Kindred VM left. Will attempt f/u in AM.     SUMMARY OF RECOMMENDATIONS    Continue current plan of care and medical management.   Important for husband to be present for GGradydiscussion. VM left and PMT provider hopeful to meet with patient/husband tomorrow 11/3.  Code Status/Advance Care Planning:  Full code  Symptom Management:   Morphine 228mIV x1  Oxycodone 47m28mO q4h prn pain  Palliative Prophylaxis:   Bowel Regimen and Frequent Pain Assessment  Psycho-social/Spiritual:   Desire for further Chaplaincy support:yes  Additional Recommendations: Caregiving  Support/Resources and Compassionate Wean Education  Prognosis:   Unable to determine  Discharge Planning: To Be Determined      Primary Diagnoses: Present on Admission: . Ascites . Abdominal pain . Cirrhosis of liver with ascites ([place] Hypoalbuminemia . Hypothyroidism . Mood disorder ([facility] Mal[place] of moderate degree ([place] Osteoporosis . Thrombocytopenia ([place] Closed left ankle fracture   I have reviewed the medical record, interviewed the patient and family, and examined the patient. The following aspects are pertinent.  Past Medical History:  Diagnosis Date  . Anxiety  pt. denies  . Arthritis   . Cirrhosis of liver ([place])   . Complication of anesthesia    ileus after surgery 2018  . Depression    "not all the time"  . Heart murmur    Echo 10/06/17 [facility]): Normal LV/RV size, basal septal hypertophy-normal variant, LVEF 65-70%, normal LV/RV function, est RAP 5 mmHg, no sign valvular stenosis or regurg--trace MR/TR/PR  . History of abdominal paracentesis   . History of blood transfusion   . Hypothyroidism   . Ileus ([place])   . Liver disease 2008  . Neuropathy   . Osteoporosis 02/09/2018  .  Pancytopenia ([place])   . Pneumonia    2019ish  . Thrombocytopenia ([place]) 12/01/2019  . Vaginal Pap smear, abnormal    Social History   Socioeconomic History  . Marital status: Married    Spouse name: Not on file  . Number of children: 2  . Years of education: Not on file  . Highest education level: Not on file  Occupational History  . Occupation: disabled  Tobacco Use  . Smoking status: Never Smoker  . Smokeless tobacco: Never Used  Vaping Use  . Vaping Use: Never used  Substance and Sexual Activity  . Alcohol use: No  . Drug use: No  . Sexual activity: Not Currently    Birth control/protection: Post-menopausal  Other Topics Concern  . Not on file  Social History Narrative  . Not on file   Social Determinants of Health   Financial Resource Strain:   . Difficulty of Paying Living Expenses: Not on file  Food Insecurity:   . Worried About Charity fundraiser in the Last Year: Not on file  . Ran Out of Food in the Last Year: Not on file  Transportation Needs:   . Lack of Transportation (Medical): Not on file  . Lack of Transportation (Non-Medical): Not on file  Physical Activity:   . Days of Exercise per Week: Not on file  . Minutes of Exercise per Session: Not on file  Stress:   . Feeling of Stress : Not on file  Social Connections:   . Frequency of Communication with Friends and Family: Not on file  . Frequency of Social Gatherings with Friends and Family: Not on file  . Attends Religious Services: Not on file  . Active Member of Clubs or Organizations: Not on file  . Attends Archivist Meetings: Not on file  . Marital Status: Not on file   Family History  Adopted: Yes  Problem Relation Age of Onset  . Alcohol abuse Daughter    Scheduled Meds: . doxycycline  100 mg Oral Q12H  . [START ON 09/04/2020] furosemide  40 mg Oral Daily  . lactulose  20 g Oral TID  . levothyroxine  75 mcg Oral QAC breakfast  . rifaximin  550 mg Oral BID  . [START ON  09/04/2020] spironolactone  150 mg Oral Daily   Continuous Infusions: . [START ON 09/04/2020] cefTRIAXone (ROCEPHIN)  IV     PRN Meds:.oxyCODONE Medications Prior to Admission:  Prior to Admission medications   Medication Sig Start Date End Date Taking? Authorizing Provider  diclofenac Sodium (VOLTAREN) 1 % GEL Apply 2 g topically daily as needed (for pain).  04/23/20  Yes [provider]  lactulose (CHRONULAC) 10 GM/15ML solution Take 30 mLs (20 g total) by mouth 3 (three) times daily. Patient taking differently: Take 20 g by mouth daily.  08/21/20  Yes Nita Sells, MD  levothyroxine (  SYNTHROID, LEVOTHROID) 75 MCG tablet Take 1 tablet (75 mcg total) by mouth daily before breakfast. 12/02/17  Yes Aline August, MD  omeprazole (PRILOSEC) 20 MG capsule Take 1 capsule (20 mg total) by mouth daily. 08/06/20  Yes Willia Craze, NP  oxyCODONE (OXY IR/ROXICODONE) 5 MG immediate release tablet Take 1 tablet (5 mg total) by mouth every 12 (twelve) hours as needed for severe pain. 08/21/20  Yes Nita Sells, MD  rifaximin (XIFAXAN) 550 MG TABS tablet Take 1 tablet (550 mg total) by mouth 2 (two) times daily. Patient taking differently: Take 550 mg by mouth 2 (two) times daily. Pt states she take once daily 05/21/20  Yes Emokpae, Courage, MD  sertraline (ZOLOFT) 100 MG tablet Take 100 mg by mouth daily. 08/17/20  Yes [provider]  sertraline (ZOLOFT) 50 MG tablet Take 1 tablet (50 mg total) by mouth daily. 08/21/20  Yes Nita Sells, MD  traZODone (DESYREL) 50 MG tablet Take 1 tablet (50 mg total) by mouth at bedtime as needed for sleep. 08/21/20  Yes Nita Sells, MD  furosemide (LASIX) 40 MG tablet Take 1 tablet (40 mg total) by mouth daily. Patient not taking: Reported on 09/03/2020 08/21/20   Nita Sells, MD  ondansetron (ZOFRAN ODT) 4 MG disintegrating tablet Take 1 tablet (4 mg total) by mouth every 8 (eight) hours as needed for nausea or  vomiting. 08/06/20   Willia Craze, NP  spironolactone (ALDACTONE) 50 MG tablet Take 3 tablets (150 mg total) by mouth daily. Patient not taking: Reported on 09/03/2020 08/21/20   Nita Sells, MD   Allergies  Allergen Reactions  . Penicillins Swelling and Other (See Comments)    FACIAL SWELLING Did it involve swelling of the face/tongue/throat, SOB, or low BP? Yes Did it involve sudden or severe rash/hives, skin peeling, or any reaction on the inside of your mouth or nose? No Did you need to seek medical attention at a hospital or doctor's office? Unknown When did it last happen?30 years If all above answers are "NO", may proceed with cephalosporin use.   . Tylenol [Acetaminophen] Nausea Only  . Sulfa Antibiotics Itching and Rash  . Tramadol Swelling and Rash   Review of Systems  Constitutional: Positive for activity change.       Chronic abdominal pain  Neurological: Positive for weakness.    Physical Exam Vitals and nursing note reviewed.  Constitutional:      General: She is awake.     Appearance: She is ill-appearing.  Cardiovascular:     Rate and Rhythm: Regular rhythm.  Pulmonary:     Effort: No tachypnea, accessory muscle usage or respiratory distress.  Abdominal:     General: There is distension.     Tenderness: There is abdominal tenderness.  Skin:    General: Skin is warm and dry.  Neurological:     Mental Status: She is alert and oriented to person, place, and time.     Comments: Uncomfortable, in significant pain  Psychiatric:        Mood and Affect: Mood normal.        Speech: Speech normal.        Behavior: Behavior normal.        Cognition and Memory: Cognition normal.    Vital Signs: BP 112/69   Pulse 95   Temp 98.3 F (36.8 C) (Oral)   Resp 17   Ht 5' 7"  (1.702 m)   Wt 54 kg   SpO2 92%   BMI  18.64 kg/m  Pain Scale: 0-10   Pain Score: 4    SpO2: SpO2: 92 % O2 Device:SpO2: 92 % O2 Flow Rate: .   IO: Intake/output  summary:   Intake/Output Summary (Last 24 hours) at 09/03/2020 1541 Last data filed at 09/03/2020 1019 Gross per 24 hour  Intake 200 ml  Output --  Net 200 ml    LBM:   Baseline Weight: Weight: 54 kg Most recent weight: Weight: 54 kg     Palliative Assessment/Data: PPS 40%    Time Total: 40 Greater than 50%  of this time was spent counseling and coordinating care related to the above assessment and plan.  Signed by:  Ihor Dow, DNP, FNP-C Palliative Medicine Team  Phone: [phone] Fax: [phone]   Please contact Palliative Medicine Team phone at [phone] for questions and concerns.  For individual provider: See Shea Evans

## 2020-09-03 NOTE — Sedation Documentation (Signed)
PT tolerated left sided paracentesis procedure well and 5.5 Liters of clear yellow fluid removed and labs sent for processing. Vital signs stable at completion of procedure and no acute distress noted. PT returned to ED room 10 at this time. Verbal order obtained from K. Jodi Mourning, PA to give 50 grams of albumin during procedure prior to procedure today.

## 2020-09-03 NOTE — ED Notes (Signed)
Patient transported to Ultrasound for fluid removal

## 2020-09-04 DIAGNOSIS — D696 Thrombocytopenia, unspecified: Secondary | ICD-10-CM

## 2020-09-04 DIAGNOSIS — N390 Urinary tract infection, site not specified: Secondary | ICD-10-CM

## 2020-09-04 DIAGNOSIS — R140 Abdominal distension (gaseous): Secondary | ICD-10-CM

## 2020-09-04 DIAGNOSIS — K7031 Alcoholic cirrhosis of liver with ascites: Secondary | ICD-10-CM

## 2020-09-04 DIAGNOSIS — E43 Unspecified severe protein-calorie malnutrition: Secondary | ICD-10-CM

## 2020-09-04 DIAGNOSIS — R319 Hematuria, unspecified: Secondary | ICD-10-CM

## 2020-09-04 DIAGNOSIS — K7460 Unspecified cirrhosis of liver: Secondary | ICD-10-CM

## 2020-09-04 DIAGNOSIS — R188 Other ascites: Secondary | ICD-10-CM

## 2020-09-04 DIAGNOSIS — E039 Hypothyroidism, unspecified: Secondary | ICD-10-CM

## 2020-09-04 LAB — COMPREHENSIVE METABOLIC PANEL
ALT: 20 U/L (ref 0–44)
AST: 34 U/L (ref 15–41)
Albumin: 3 g/dL — ABNORMAL LOW (ref 3.5–5.0)
Alkaline Phosphatase: 90 U/L (ref 38–126)
Anion gap: 7 (ref 5–15)
BUN: 7 mg/dL — ABNORMAL LOW (ref 8–23)
CO2: 24 mmol/L (ref 22–32)
Calcium: 8.2 mg/dL — ABNORMAL LOW (ref 8.9–10.3)
Chloride: 101 mmol/L (ref 98–111)
Creatinine, Ser: 0.5 mg/dL (ref 0.44–1.00)
GFR, Estimated: >60 mL/min (ref >60)
Glucose, Bld: 77 mg/dL (ref 70–99)
Potassium: 3.4 mmol/L — ABNORMAL LOW (ref 3.5–5.1)
Sodium: 132 mmol/L — ABNORMAL LOW (ref 135–145)
Total Bilirubin: 2.4 mg/dL — ABNORMAL HIGH (ref 0.3–1.2)
Total Protein: 5.4 g/dL — ABNORMAL LOW (ref 6.5–8.1)

## 2020-09-04 LAB — CBC
HCT: 24.3 % — ABNORMAL LOW (ref 36.0–46.0)
Hemoglobin: 7 g/dL — ABNORMAL LOW (ref 12.0–15.0)
MCH: 25.3 pg — ABNORMAL LOW (ref 26.0–34.0)
MCHC: 28.8 g/dL — ABNORMAL LOW (ref 30.0–36.0)
MCV: 87.7 fL (ref 80.0–100.0)
Platelets: 36 10*3/uL — ABNORMAL LOW (ref 150–400)
RBC: 2.77 MIL/uL — ABNORMAL LOW (ref 3.87–5.11)
RDW: 18.6 % — ABNORMAL HIGH (ref 11.5–15.5)
WBC: 5.2 10*3/uL (ref 4.0–10.5)
nRBC: 0 % (ref 0.0–0.2)

## 2020-09-04 LAB — PROTIME-INR
INR: 1.7 — ABNORMAL HIGH (ref 0.8–1.2)
Prothrombin Time: 19.4 seconds — ABNORMAL HIGH (ref 11.4–15.2)

## 2020-09-04 LAB — URINE CULTURE: Culture: NO GROWTH

## 2020-09-04 LAB — AMMONIA: Ammonia: 33 umol/L (ref 9–35)

## 2020-09-04 LAB — PREPARE RBC (CROSSMATCH)

## 2020-09-04 LAB — ABO/RH: ABO/RH(D): O POS

## 2020-09-04 LAB — PATHOLOGIST SMEAR REVIEW

## 2020-09-04 LAB — MAGNESIUM: Magnesium: 1.6 mg/dL — ABNORMAL LOW (ref 1.7–2.4)

## 2020-09-04 MED ORDER — SODIUM CHLORIDE 0.9% IV SOLUTION: Freq: Once | INTRAVENOUS | Status: AC

## 2020-09-04 MED ORDER — MAGNESIUM SULFATE 2 GM/50ML IV SOLN
2.0000 g | Freq: Once | INTRAVENOUS | Status: AC
  Administered 2020-09-04: 2 g via INTRAVENOUS
  Filled 2020-09-04: qty 50

## 2020-09-04 MED ORDER — ONDANSETRON HCL 4 MG/2ML IJ SOLN
4.0000 mg | Freq: Four times a day (QID) | INTRAMUSCULAR | Status: DC | PRN
  Administered 2020-09-04: 4 mg via INTRAVENOUS

## 2020-09-04 MED ORDER — ALBUMIN HUMAN 25 % IV SOLN
50.0000 g | Freq: Every day | INTRAVENOUS | Status: AC
  Administered 2020-09-05 – 2020-09-07 (×3): 50 g via INTRAVENOUS
  Filled 2020-09-04 (×3): qty 200

## 2020-09-04 MED ORDER — CHLORHEXIDINE GLUCONATE CLOTH 2 % EX PADS
6.0000 | MEDICATED_PAD | Freq: Every day | CUTANEOUS | Status: DC
  Administered 2020-09-04 – 2020-09-08 (×5): 6 via TOPICAL

## 2020-09-04 MED ORDER — FUROSEMIDE 10 MG/ML IJ SOLN: 40.0000 mg | Freq: Every day | INTRAMUSCULAR | Status: DC

## 2020-09-04 MED ORDER — ALBUMIN HUMAN 25 % IV SOLN: 50.0000 g | Freq: Every day | INTRAVENOUS | Status: DC

## 2020-09-04 MED ORDER — ADULT MULTIVITAMIN W/MINERALS CH
1.0000 | ORAL_TABLET | Freq: Every day | ORAL | Status: DC
  Administered 2020-09-04 – 2020-09-08 (×5): 1 via ORAL
  Filled 2020-09-04 (×5): qty 1

## 2020-09-04 MED ORDER — POTASSIUM CHLORIDE CRYS ER 20 MEQ PO TBCR
40.0000 meq | EXTENDED_RELEASE_TABLET | Freq: Once | ORAL | Status: AC
  Administered 2020-09-04: 40 meq via ORAL
  Filled 2020-09-04: qty 2

## 2020-09-04 MED ORDER — FUROSEMIDE 10 MG/ML IJ SOLN
40.0000 mg | Freq: Every day | INTRAMUSCULAR | Status: DC
  Administered 2020-09-05: 40 mg via INTRAVENOUS
  Filled 2020-09-04 (×2): qty 4

## 2020-09-04 MED ORDER — ENSURE ENLIVE PO LIQD
237.0000 mL | Freq: Two times a day (BID) | ORAL | Status: DC
  Administered 2020-09-04 – 2020-09-08 (×6): 237 mL via ORAL

## 2020-09-04 NOTE — Progress Notes (Signed)
PROGRESS NOTE    Megan Roberson  BSW:[id number] DOB: [birth year] DOA: 09/02/2020 PCP: Celene Squibb, MD    Chief Complaint  Patient presents with  . Abdominal Pain    Brief Narrative:  64 year old lady prior history of [place] cirrhosis, esophageal varices, hepatic encephalopathy, generalized anxiety disorder, peripheral neuropathy, thrombocytopenia, hypothyroidism, depression, ascites, follows up with GI as an outpatient presents to ED for worsening abdominal distention.  On arrival to ED she was found to have significant ascites.  CT of the abdomen pelvis which shows gaseous distention of the colon without any acute findings.  She underwent ultrasound paracentesis and 5.5 L of ascitic fluid aspirated. GI consulted for recommendations.  Assessment & Plan:   Principal Problem:   Ascites Active Problems:   Cirrhosis of liver with ascites (HCC)   Abdominal pain   Thrombocytopenia (HCC)   Palliative care by specialist   Protein-calorie malnutrition, severe ([place])   Goals of care, counseling/discussion   Osteoporosis   Hypoalbuminemia   Generalized weakness   Hypothyroidism   UTI (urinary tract infection)   Mood disorder (HCC)   Closed left ankle fracture   Liver cirrhosis with ascites S/p ultrasound paracentesis with removal of 5.5 L of clear liquid fluid.  She received 50 g of IV albumin post paracentesis.  Ascites fluid sent for analysis and culture. Meanwhile continue with Lasix and spironolactone. Continue with his rifaximin.     Hepatic encephalopathy Probably secondary to noncompliance to lactulose. Improving. Continue with lactulose 3 times daily with goal of 3-4 bowel movements per day. Continue with rifaximin. Patient's meld score is 15    Thickening of her colon/ascending colon Probably secondary to liver cirrhosis. Outpatient follow-up with a colonoscopy    Esophageal varices Patient will need outpatient follow-up for an EGD for surveillance of  varices.   Community-acquired pneumonia Continue with Rocephin and doxycycline.  Mild UTI Follow urine cultures, continue with Rocephin.    Hypothyroidism Continue with Synthroid.   Severe protein calorie malnutrition Dietary consult    Thrombocytopenia Probably secondary to liver cirrhosis.   In view of her multiple medical problems palliative care consulted for goals of care   DVT prophylaxis: SCDs code Status: (Full code Family Communication: None at bedside Disposition:   Status is: Inpatient  Remains inpatient appropriate because:Ongoing diagnostic testing needed not appropriate for outpatient work up, IV treatments appropriate due to intensity of illness or inability to take PO and Inpatient level of care appropriate due to severity of illness   Dispo: The patient is from: Home              Anticipated d/c is to: Pending              Anticipated d/c date is: 2 days              Patient currently is not medically stable to d/c.       Consultants:   Gastroenterology  Palliative care  Procedures: Ultrasound paracentesis Antimicrobials: IV Rocephin  Subjective: Patient reports being nauseated, abdominal pain is persistent.  No chest pain or shortness of breath  Objective: Vitals:   09/04/20 0819 09/04/20 0835 09/04/20 1539 09/04/20 1555  BP:  109/69 104/77 121/78  Pulse:  77 89 88  Resp:  19 18 18   Temp:  98.1 F (36.7 C) 99 F (37.2 C) 98.4 F (36.9 C)  TempSrc:  Oral Oral Axillary  SpO2:  96% 98% 96%  Weight: 57 kg     Height: 5' 7"  (1.702  m)       Intake/Output Summary (Last 24 hours) at 09/04/2020 1707 Last data filed at 09/04/2020 1509 Gross per 24 hour  Intake 630 ml  Output --  Net 630 ml   Filed Weights   09/02/20 2347 09/04/20 0819  Weight: 54 kg 57 kg    Examination:  General exam: Appears calm and comfortable  Respiratory system: Clear to auscultation. Respiratory effort normal. Cardiovascular system: S1 & S2 heard,  RRR. No JVD, murmurs, No pedal edema. Gastrointestinal system: Abdomen is soft, distended, mildly tender in the lower quadrant, normal bowel sounds heard. Central nervous system: Alert and oriented. No focal neurological deficits. Extremities: No edema present Skin: No ulcers seen Psychiatry: Mood & affect appropriate.     Data Reviewed: I have personally reviewed following labs and imaging studies  CBC: Recent Labs  Lab 09/03/20 0015 09/04/20 0540  WBC 8.9 5.2  NEUTROABS 6.6  --   HGB 8.1* 7.0*  HCT 28.3* 24.3*  MCV 87.9 87.7  PLT 47* 36*    Basic Metabolic Panel: Recent Labs  Lab 09/03/20 0015 09/04/20 0540  NA 136 132*  K 3.7 3.4*  CL 109 101  CO2 22 24  GLUCOSE 74 77  BUN 9 7*  CREATININE 0.55 0.50  CALCIUM 7.9* 8.2*  MG  --  1.6*    GFR: Estimated Creatinine Clearance: 66.5 mL/min (by C-G formula based on SCr of 0.5 mg/dL).  Liver Function Tests: Recent Labs  Lab 09/03/20 0015 09/04/20 0540  AST 42* 34  ALT 27 20  ALKPHOS 115 90  BILITOT 2.5* 2.4*  PROT 6.0* 5.4*  ALBUMIN 2.6* 3.0*    CBG: No results for input(s): GLUCAP in the last 168 hours.   Recent Results (from the past 240 hour(s))  Urine culture     Status: None   Collection Time: 09/03/20 12:23 AM   Specimen: Urine, Catheterized  Result Value Ref Range Status   Specimen Description   Final    URINE, CATHETERIZED Performed at [facility], [address] [place], [place] [address]    Special Requests   Final    NONE Performed at [facility], [address], [place], [place] [address]    Culture   Final    NO GROWTH Performed at [facility], [place] [address], [place], [place] [address]    Report Status 09/04/2020 FINAL  Final  Respiratory Panel by RT PCR (Flu A&B, Covid) - Nasopharyngeal Swab     Status: None   Collection Time: 09/03/20  5:11 AM   Specimen: Nasopharyngeal Swab  Result Value Ref Range Status   SARS Coronavirus 2 by RT PCR NEGATIVE NEGATIVE Final     Comment: (NOTE) SARS-CoV-2 target nucleic acids are NOT DETECTED.  The SARS-CoV-2 RNA is generally detectable in upper respiratoy specimens during the acute phase of infection. The lowest concentration of SARS-CoV-2 viral copies this assay can detect is 131 copies/mL. A negative result does not preclude SARS-Cov-2 infection and should not be used as the sole basis for treatment or other patient management decisions. A negative result may occur with  improper specimen collection/handling, submission of specimen other than nasopharyngeal swab, presence of viral mutation(s) within the areas targeted by this assay, and inadequate number of viral copies (<131 copies/mL). A negative result must be combined with clinical observations, patient history, and epidemiological information. The expected result is Negative.  Fact Sheet for Patients:  PinkCheek.be  Fact Sheet for Healthcare Providers:  GravelBags.it  This  test is no t yet approved or cleared by the [place] and  has been authorized for detection and/or diagnosis of SARS-CoV-2 by FDA under an Emergency Use Authorization (EUA). This EUA will remain  in effect (meaning this test can be used) for the duration of the COVID-19 declaration under Section 564(b)(1) of the Act, 21 U.S.C. section 360bbb-3(b)(1), unless the authorization is terminated or revoked sooner.     Influenza A by PCR NEGATIVE NEGATIVE Final   Influenza B by PCR NEGATIVE NEGATIVE Final    Comment: (NOTE) The Xpert Xpress SARS-CoV-2/FLU/RSV assay is intended as an aid in  the diagnosis of influenza from Nasopharyngeal swab specimens and  should not be used as a sole basis for treatment. Nasal washings and  aspirates are unacceptable for Xpert Xpress SARS-CoV-2/FLU/RSV  testing.  Fact Sheet for Patients: PinkCheek.be  Fact Sheet for Healthcare  Providers: GravelBags.it  This test is not yet approved or cleared by the [place] FDA and  has been authorized for detection and/or diagnosis of SARS-CoV-2 by  FDA under an Emergency Use Authorization (EUA). This EUA will remain  in effect (meaning this test can be used) for the duration of the  Covid-19 declaration under Section 564(b)(1) of the Act, 21  U.S.C. section 360bbb-3(b)(1), unless the authorization is  terminated or revoked. Performed at [facility], [address] [place], [place] [address]   Gram stain     Status: None   Collection Time: 09/03/20  9:44 AM   Specimen: Ascitic; Body Fluid  Result Value Ref Range Status   Specimen Description ASCITIC  Final   Special Requests Immunocompromised  Final   Gram Stain   Final    NO ORGANISMS SEEN WBC PRESENT, PREDOMINANTLY MONONUCLEAR Performed at [facility], [address] [place], [place] [address]    Report Status 09/03/2020 FINAL  Final  Culture, body fluid-bottle     Status: None (Preliminary result)   Collection Time: 09/03/20  9:44 AM   Specimen: Ascitic  Result Value Ref Range Status   Specimen Description ASCITIC  Final   Special Requests 10CC  Final   Culture   Final    NO GROWTH < 24 HOURS Performed at [facility], [address] [place], [place] [address]    Report Status PENDING  Incomplete         Radiology Studies: CT ABDOMEN PELVIS W CONTRAST  Result Date: 09/03/2020 CLINICAL DATA:  Abdominal distension, hematuria EXAM: CT ABDOMEN AND PELVIS WITH CONTRAST TECHNIQUE: Multidetector CT imaging of the abdomen and pelvis was performed using the standard protocol following bolus administration of intravenous contrast. CONTRAST:  69m OMNIPAQUE IOHEXOL 300 MG/ML  SOLN COMPARISON:  08/15/2020 FINDINGS: Lower chest: Ground-glass pulmonary infiltrate within the right lower lobe has improved slightly in the interval since prior examination, likely infectious in etiology.  Moderate left pleural effusion is present, increased in size since prior exam. Compressive atelectasis of the left lower lobe noted. Cardiac size within normal limits. Gastroesophageal varices are noted. Hepatobiliary: Cirrhosis. No enhancing intrahepatic mass is clearly identified. There is dilation of the right superior portal vein likely related to an underlying intrahepatic portal venous shunt. Cholelithiasis again noted without obvious pericholecystic inflammatory change, though evaluation is slightly limited by the presence of ascites. No intra or extrahepatic biliary ductal dilation. Pancreas: Unremarkable Spleen: The spleen is enlarged, unchanged from prior examination, measuring 15.6 cm in greatest dimension, in keeping with changes of portal venous hypertension. No intrasplenic lesions are seen. The splenic  vein is patent. Adrenals/Urinary Tract: The adrenal glands are unremarkable. 13 mm x 14 mm nonobstructing calculus is seen within the right renal pelvis. The kidneys are otherwise unremarkable. The bladder is decompressed with a Foley catheter balloon seen within its lumen. Stomach/Bowel: The stomach and small bowel are unremarkable. Previously noted circumferential wall thickening involving the descending and proximal sigmoid colon has resolved suggesting that the findings on prior examination were related to and a infectious or inflammatory process at that time. There has developed mild surf rental thickening, similar to the prior examination, now involving the ascending colon. No evidence of obstruction or perforation. No free intraperitoneal gas. Large volume ascites has progressed since prior examination. Vascular/Lymphatic: Recanalization of the umbilical vein is present in keeping with portosystemic collateralization. Similarly, several and rectal varices are noted within the left lateral wall of the rectum. The abdominal aorta is of normal caliber. No pathologic adenopathy within the abdomen and  pelvis. Reproductive: Simple appearing cysts are seen within the adnexa bilaterally, stable since prior examination. There size does not warrant further follow-up. Uterus unremarkable. Other: Bilateral breast implants are partially visualized. Musculoskeletal: Left hip ORIF has been performed. Healed left iliac crest fracture. Compression fractures T12 and superior endplate fracture of L1 are unchanged from prior examination no acute bone abnormality. IMPRESSION: Slight interval improvement in probable infectious pulmonary infiltrate within the right lower lobe. Cirrhosis. 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 Gastroesophageal and perirectal portosystemic collateral varices are identified. Resolved circumferential wall thickening involving the distal colon noted on prior examination, likely infectious or inflammatory process at that time. New similar appearing mural thickening involving the ascending colon, likely infectious or inflammatory. Given its restricted territory, portal colopathy is considered less likely. Electronically Signed   By: Fidela Salisbury MD   On: 09/03/2020 02:48   US Paracentesis  Result Date: 09/03/2020 INDICATION: Cirrhosis, ascites EXAM: ULTRASOUND GUIDED DIAGNOSTIC AND THERAPEUTIC PARACENTESIS MEDICATIONS: None COMPLICATIONS: None immediate PROCEDURE: Informed written consent was obtained from the patient after a discussion of the risks, benefits and alternatives to treatment. A timeout was performed prior to the initiation of the procedure. Initial ultrasound scanning demonstrates a large amount of ascites within the LEFT lower abdominal quadrant. The right lower abdomen was prepped and draped in the usual sterile fashion. 1% lidocaine was used for local anesthesia. Following this, a 5 [place] Yueh catheter was introduced. An ultrasound image was saved for documentation purposes. The  paracentesis was performed. The catheter was removed and a dressing was applied. The patient tolerated the procedure well without immediate post procedural complication. Patient received post-procedure intravenous albumin; see nursing notes for details. FINDINGS: A total of approximately 5.5 L of clear light yellow ascitic fluid was removed. Samples were sent to the laboratory as requested by the clinical team. IMPRESSION: Successful ultrasound-guided paracentesis yielding 5.5 liters of peritoneal fluid. Electronically Signed   By: Lavonia Dana M.D.   On: 09/03/2020 10:39   DG CHEST PORT 1 VIEW  Result Date: 09/03/2020 CLINICAL DATA:  Abdominal pain and distension.  Previous pneumonia. EXAM: PORTABLE CHEST 1 VIEW COMPARISON:  CT of the chest 08/16/2020 FINDINGS: Right-sided airspace disease is similar the prior exam. Fluid in the minor fissure and right CP angle is again seen. Minimal fluid and atelectasis is present at the left base. Heart size is normal. Lung volumes are low. IMPRESSION: 1. Stable right-sided airspace disease and effusion. Findings are concerning for pneumonia. 2. Minimal fluid and  atelectasis at the left base. Electronically Signed   By: San Morelle M.D.   On: 09/03/2020 12:01   DG Abdomen Acute W/Chest  Result Date: 09/03/2020 CLINICAL DATA:  Abdominal pain, shortness of breath EXAM: DG ABDOMEN ACUTE WITH 1 VIEW CHEST COMPARISON:  08/15/2020 FINDINGS: Previously seen 12 mm right renal pelvic stone again noted. There is gaseous distention of the colon, likely mild ileus. No small bowel distention or free air. No organomegaly. Heart is normal size. Scarring at the right lung base. Left lung clear. IMPRESSION: Mild gaseous distention of the colon, likely ileus. Right renal pelvic stone again noted. Right basilar scarring. Electronically Signed   By: Rolm Baptise M.D.   On: 09/03/2020 00:49        Scheduled Meds: . Chlorhexidine Gluconate Cloth  6 each Topical Daily  .  doxycycline  100 mg Oral Q12H  . feeding supplement  237 mL Oral BID BM  . furosemide  40 mg Oral Daily  . lactulose  20 g Oral TID  . levothyroxine  75 mcg Oral QAC breakfast  . multivitamin with minerals  1 tablet Oral Daily  . rifaximin  550 mg Oral BID  . spironolactone  150 mg Oral Daily   Continuous Infusions: . cefTRIAXone (ROCEPHIN)  IV Stopped (09/04/20 0939)     LOS: 1 day        Hosie Poisson, MD [facility]   To contact the attending provider between 7A-7P or the covering provider during after hours 7P-7A, please log into the web site [email or web address] and access using universal [place] password for that web site. If you do not have the password, please call the hospital operator.  09/04/2020, 5:07 PM

## 2020-09-04 NOTE — Progress Notes (Signed)
Subjective:  Patient complains of headache. She has not had nausea or vomiting. She has been drinking ginger ale. She also ate some of her meals. She complains of abdominal distention. She states her ascites is reaccumulating rapidly. She is not having shortness of breath.  Current Medications: Meds reviewed.  Objective: Blood pressure 115/69, pulse 85, temperature 98.1 F (36.7 C), temperature source Oral, resp. rate 19, height 5' 7"  (1.702 m), weight 57 kg, SpO2 97 %. Patient is alert. She has generalized muscle wasting. He does not have asterixis. Conjunctivae is pale. Sclerae nonicteric. Cardiac exam with regular rhythm normal S1 and S2. No murmur gallop noted Abdomen is distended and somewhat tense but not tender. Extremities are thin. 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  Labs/studies Results:  CBC Latest Ref Rng & Units 09/04/2020 09/03/2020 08/21/2020  WBC 4.0 - 10.5 K/uL 5.2 8.9 8.6  Hemoglobin 12.0 - 15.0 g/dL 7.0(L) 8.1(L) 8.0(L)  Hematocrit 36 - 46 % 24.3(L) 28.3(L) 26.5(L)  Platelets 150 - 400 K/uL 36(L) 47(L) 50(L)    CMP Latest Ref Rng & Units 09/04/2020 09/03/2020 08/19/2020  Glucose 70 - 99 mg/dL 77 74 106(H)  BUN 8 - 23 mg/dL 7(L) 9 12  Creatinine 0.44 - 1.00 mg/dL 0.50 0.55 0.53  Sodium 135 - 145 mmol/L 132(L) 136 134(L)  Potassium 3.5 - 5.1 mmol/L 3.4(L) 3.7 3.5  Chloride 98 - 111 mmol/L 101 109 99  CO2 22 - 32 mmol/L 24 22 24   Calcium 8.9 - 10.3 mg/dL 8.2(L) 7.9(L) 8.5(L)  Total Protein 6.5 - 8.1 g/dL 5.4(L) 6.0(L) 5.8(L)  Total Bilirubin 0.3 - 1.2 mg/dL 2.4(H) 2.5(H) 2.2(H)  Alkaline Phos 38 - 126 U/L 90 115 91  AST 15 - 41 U/L 34 42(H) 28  ALT 0 - 44 U/L 20 27 20     Hepatic Function Latest Ref Rng & Units 09/04/2020 09/03/2020 08/19/2020  Total Protein 6.5 - 8.1 g/dL 5.4(L) 6.0(L) 5.8(L)  Albumin 3.5 - 5.0 g/dL 3.0(L) 2.6(L) 2.8(L)  AST 15 - 41 U/L 34 42(H) 28  ALT 0 -  44 U/L 20 27 20   Alk Phosphatase 38 - 126 U/L 90 115 91  Total Bilirubin 0.3 - 1.2 mg/dL 2.4(H) 2.5(H) 2.2(H)  Bilirubin, Direct 0.0 - 0.2 mg/dL - - -    Ascitic fluid analysis. Clear yellow color WBC 60 and neutrophils 49% Gram stain negative for organisms. Cytology reveals reactive mesothelial cells and macrophages. No atypical cells seen.  Ascitic fluid cultures negative at 24 hours.  Serum ammonia 33 which is normal.  Assessment:  #1. Cirrhotic ascites. She had 5.5 L of fluid removed yesterday. Her abdomen is fairly distended again. Fluid analysis and cultures negative for SBP. She may benefit from IV albumin followed by furosemide intravenously as she may not be absorbing furosemide when given orally.  #2. Anemia. Hemoglobin has been steadily dropping. However no evidence of overt GI bleed. Patient is receiving blood transfusion. Last EGD was in May 2019 and she had very small esophageal varices as well as nonbleeding gastric ulcers. . #3. Hepatic encephalopathy. Patient appears to be at baseline.  #4. 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 Her meld score is gradually creeping up. Based on lab values from this morning it is 20.    Recommendations  Change diet to 2 g sodium diet. DC oral furosemide. Albumin 50 g IV daily x3 doses to be followed by  furosemide 40 mg IV daily x3 doses. Daily weights on standing scale. Hemoccult x1.

## 2020-09-04 NOTE — Progress Notes (Signed)
Initial Nutrition Assessment  DOCUMENTATION CODES:   Severe malnutrition in context of chronic illness  INTERVENTION:  Ensure Enlive po TID, each supplement provides 350 kcal and 20 grams of protein  Magic cup BID with meals, each supplement provides 290 kcal and 9 grams of protein  CIB on breakfast tray, each supplement with 237 ml whole milk provides 280 kcal and 13 grams of protein  MVI with minerals daily  Education provided  NUTRITION DIAGNOSIS:   Severe Malnutrition related to chronic illness as evidenced by moderate fat depletion, severe fat depletion, moderate muscle depletion, severe muscle depletion, energy intake < 75% for > or equal to 1 month.    GOAL:   Patient will meet greater than or equal to 90% of their needs    MONITOR:   PO intake, Supplement acceptance, I & O's, Weight trends, Labs, Skin  REASON FOR ASSESSMENT:   Malnutrition Screening Tool    ASSESSMENT:   64 year old female with history significant of NASH cirrhosis required multiple paracentesis in the past, esophageal varices, hepatic encephalopathy, osteoarthritis, generalized anxiety disorder, peripheral neuropathy, hypothyroidism, and thrombocytopenia presents with 3 days of increasing abdominal distention.  11/2- US guided paracentesis 5.5 L fluid removed  Patient awake and alert this afternoon, husband at bedside. She is familiar to nutrition department due to recent admissions and seen last by this RD on 08/14/20. Pt reports ongoing early satiety and poor appetite, says she has not been out of bed much since returning home secondary to her stomach hurting as well as chronic weakness. RD reviewed strategies discussed during prior admission, encouraged small frequent meals and snacks throughout the day, recommended limiting sodium intake, sitting upright for at lease one hour after eating, as well as discussed ways to maximize calorie/protien intake and recommended daily MVI. Husband is very  supportive, prepares meals/snacks and encourages po intake at home. Recalls eggs with cheese, drinks CIB as well as adds the powder to ice creams, cooks with butter, and is drinking Ensure supplements as able. Pt is requesting coupons for Boost/Ensure supplements, RD will attempt to obtain coupons for pt prior to discharge. Pt agreeable to drinking Ensure supplements and trying Magic Cup to aid with meeting needs. RD provided chocolate Ensure and vanilla Magic Cup from nourishment room.   I/Os: -1800 ml since admit UOP: 2000 ml x 24 hrs  Per chart, weights stable over the past 3 weeks however if weight on admission was stated. Would expect some fluctuation in weight due to fluid shifts. Repeat NFPE today with moderate/severe fat and muscle depletions, unfortunately chronic malnutrition persists.   Medications reviewed and include: Doxycycline, Lasix 40 mg daily, Lactulose 20 mg 3 times daily, Rocephin  Labs: Na 132 (L), K 3.4 (L), Mg 1.6 (L), Hgb 7 (L), HCT 24.3 (L)  NUTRITION - FOCUSED PHYSICAL EXAM:    Most Recent Value  Orbital Region Moderate depletion  Upper Arm Region Severe depletion  Thoracic and Lumbar Region Unable to assess  Buccal Region Severe depletion  Temple Region Moderate depletion  Clavicle Bone Region Severe depletion  Clavicle and Acromion Bone Region Severe depletion  Scapular Bone Region Unable to assess  Dorsal Hand Severe depletion  Patellar Region Severe depletion  Anterior Thigh Region Severe depletion  Posterior Calf Region Severe depletion  Hair Reviewed  [pt c/o large peices falling out]  Eyes Reviewed  Mouth Reviewed  Skin Reviewed  [thin skin,  ecchymosis]  Nails Reviewed       Diet Order:  Diet Order            Diet Heart Room service appropriate? Yes; Fluid consistency: Thin  Diet effective now                 EDUCATION NEEDS:   Education needs have been addressed  Skin:  Skin Assessment: Reviewed RN Assessment  Last BM:  11/2 per  pt  Height:   Ht Readings from Last 1 Encounters:  09/04/20 5' 7"  (1.702 m)    Weight:   Wt Readings from Last 1 Encounters:  09/04/20 57 kg    BMI:  Body mass index is 19.68 kg/m.  Estimated Nutritional Needs:   Kcal:  1825-2000  Protein:  90-100  Fluid:  per MD    Lajuan Lines, RD, LDN Clinical Nutrition After Hours/Weekend Pager # in [place]

## 2020-09-04 NOTE — Progress Notes (Signed)
Daily Progress Note   Patient Name: Megan Roberson       Date: 09/04/2020 DOB: [birth year]  Age: 64 y.o. MRN#: [id number] Attending Physician: Hosie Poisson, MD Primary Care Physician: Celene Squibb, MD Admit Date: 09/02/2020  Reason for Consultation/Follow-up: Establishing goals of care  Subjective: Patient awake, alert, oriented and able to participate in discussion.   GOC:   Met with patient and husband, Nicole [facility] at bedside to discuss goals of care.   I introduced Palliative Medicine as specialized medical care for people living with serious illness. It focuses on providing relief from the symptoms and stress of a serious illness. The goal is to improve quality of life for both the patient and the family.  We discussed a brief life review of the patient. Diagnosed with cirrhosis ~13 years ago. Moved from [place] ~4 years ago. Children still living in [place]. Patient and husband report she was on the liver transplant list there but moved to [facility], as it was their understanding that liver transplant process was 'better.' She is not currently not on the transplant list. Has seen [place] NP, hepatology specialist in [place].   Functionally, Megan Roberson has been declining in the last 3 months secondary to fall and ankle fracture. She is very weak with frequent falls.   Discussed events leading up to admission and course of hospitalization including diagnoses, interventions, plan of care. Discussed GI recommendations including compliance with medical management for cirrhosis.   I attempted to elicit values and goals of care important to the patient and husband. They are interested in second opinion regarding liver transplant. Patient is seen by Dr. Havery Moros about 1x a month. Encouraged further  discussions with GI regarding liver transplant. Expressed concern with barriers to transplant including functional status.   Advanced directives, concepts specific to code status, artifical feeding and hydration were discussed. Introduced AD packet and MOST form. Encouraged early discussions regarding her goals and wishes. Discussed terminal, incurable nature of her condition, especially if transplant is not an option moving forward. Discussed high risk for recurrent admission and complications from decompensated liver cirrhosis.   Megan Roberson and Nicole [facility]red share that their daughter died from ETOH liver cirrhosis in her 62's. Megan Roberson shares that her daughter was on life support, able to wean successfully, and survived 4+ months off of life support with support from  hospice. This is a reason why she has considered remaining a full code with hopes for more time like her daughter. Encouraged she consider limitations to care (DNR/DNI) with underlying terminal/incurable condition, multiple complications from CPR, 24+ years older than her daughter, and high risk this would be very traumatic to her/not change the outcome. Discussed concept of treating the treatable and continuing medical management but not aggressive measures when her time is near.   If critically ill, Megan Roberson would wish for Nicole [facility] to make decisions for her. At this point she is not ready to "throw in the towel" and give up. Encouraged ongoing discussions with her husband.   Discussed hoping for the best but also preparing for the worst, especially if not a transplant candidate. Patient is seen by a pain specialist but pain is poorly controlled due to hesitancy with prescribing with underlying cirrhosis. Explained that if it is determined she is not a candidate for transplant, recommendation for palliative focused pathway in order to liberalize pain medication. Discussed quality of life aspects and consideration of hospice services in the future if focus is  comfort, quality, and symptom management.   Discussed outpatient palliative referral. They are interested. Also in home health services since she is very weak. Husband prefer she not return to SNF.   Questions and concerns were addressed.  Hard Choices booklet and PMT contact information given.    Length of Stay: 1  Current Medications: Scheduled Meds:  . Chlorhexidine Gluconate Cloth  6 each Topical Daily  . doxycycline  100 mg Oral Q12H  . feeding supplement  237 mL Oral BID BM  . furosemide  40 mg Oral Daily  . lactulose  20 g Oral TID  . levothyroxine  75 mcg Oral QAC breakfast  . rifaximin  550 mg Oral BID  . spironolactone  150 mg Oral Daily    Continuous Infusions: . cefTRIAXone (ROCEPHIN)  IV Stopped (09/04/20 0939)    PRN Meds: ondansetron (ZOFRAN) IV, oxyCODONE  Physical Exam Vitals and nursing note reviewed.  Constitutional:      General: She is awake.     Appearance: She is ill-appearing.  HENT:     Head: Normocephalic and atraumatic.  Pulmonary:     Effort: No tachypnea, accessory muscle usage or respiratory distress.  Abdominal:     General: There is distension.     Tenderness: There is abdominal tenderness.     Comments: ascites  Neurological:     Mental Status: She is alert and oriented to person, place, and time.            Vital Signs: BP 104/77   Pulse 89   Temp 99 F (37.2 C) (Oral)   Resp 18   Ht 5' 7"  (1.702 m)   Wt 57 kg   SpO2 98%   BMI 19.68 kg/m  SpO2: SpO2: 98 % O2 Device: O2 Device: Room Air O2 Flow Rate:    Intake/output summary:   Intake/Output Summary (Last 24 hours) at 09/04/2020 1544 Last data filed at 09/04/2020 1509 Gross per 24 hour  Intake 630 ml  Output --  Net 630 ml   LBM:   Baseline Weight: Weight: 54 kg Most recent weight: Weight: 57 kg       Palliative Assessment/Data: PPS 40%      Patient Active Problem List   Diagnosis Date Noted  . Ascites 09/03/2020  . Closed left ankle fracture 09/03/2020   . Mood disorder ([place]) 08/17/2020  . Right lower lobe  pneumonia 08/16/2020  . Asymptomatic bacteriuria   . Hypothyroidism 08/14/2020  . Displaced fracture of medial cuneiform of left foot, initial encounter for closed fracture 08/14/2020  . Failure to thrive in adult 08/14/2020  . Weight loss 08/14/2020  . UTI (urinary tract infection) 08/14/2020  . Fall at home, initial encounter 08/14/2020  . Generalized weakness 08/13/2020  . Pain in right knee 06/04/2020  . Pain in left hip 06/04/2020  . Nausea 05/19/2020  . Prolonged QT interval 05/19/2020  . Elevated brain natriuretic peptide (BNP) level 05/19/2020  . Hypoalbuminemia 05/19/2020  . Cough 05/19/2020  . Atelectasis 05/19/2020  . Hypomagnesemia 05/19/2020  . Cysts of both ovaries 06/15/2019  . Closed fracture of left tibial plateau 05/17/18 07/08/2018  . Esophageal varices without bleeding ([place])   . Osteoporosis 02/09/2018  . Goals of care, counseling/discussion 01/18/2018  . Refractory ascites 11/25/2017  . Cellulitis, abdominal wall 11/25/2017  . SOB (shortness of breath) 11/25/2017  . Malnutrition of moderate degree ([place]) 11/06/2017  . Ogilvie's syndrome   . Abdominal distension   . Abdominal pain   . Liver failure without hepatic coma ([place])   . Thrombocytopenia ([place])   . Palliative care by specialist   . Closed fracture of lumbar spine without lesion of spinal cord ([place])   . Cirrhosis of liver with ascites ([place]) 04/21/2017  . Pancytopenia ([place]) 04/21/2017  . History of open reduction and internal fixation (ORIF) procedure 04/21/2017  . Visit for suture removal 04/21/2017  . Left Olecranon fracture 04/21/2017  . Hypokalemia 04/21/2017    Palliative Care Assessment & Plan   Patient Profile: 64 y.o. female  with past medical history of NASH cirrhosis, esophageal varices, hepatic encephalopathy, osteoarthritis, anxiety, depression, peripheral neuropathy, hypothyroidism, thrombocytopenia admitted on 09/02/2020 with  abdominal distension x3 days. Patient reports she has not been taking lasix due to hypokalemia and not taking lactulose regularly due to difficulty getting to the bathroom. Outpatient GI was planning paracentesis but unfortunately patient presented to ER with worsening symptoms. Found to have significant ascites s/p paracentesis with 5.5L fluid removed. CT abdomen/pelvis shows gaseous distention of colon without acute findings. GI following. Likely will need outpatient colonoscopy and EGD. Palliative medicine consultation for goals of care.   Assessment: NASH cirrhosis Recurrent ascites Hepatic encephalopathy Thickening of colon/ascending colon Esophageal varices CAP Mild UTI Hypothyroidism Severe protein calorie malnutrition Thrombocytopenia  Recommendations/Plan: Continue full code/full scope treatment per patient wishes.  Encouraged ongoing palliative discussions regarding her goals/wishes with husband. Introduced AD packet and MOST form.  Patient has seen D[place]ek, hepatology NP in [place]. Patient/husband interested in second opinion regarding liver transplant. Discussed with Dr. Karleen Hampshire who will discuss with GI.  Home health services on discharge. Patient/husband interested in outpatient palliative referral.  Introduced palliative focused pathway of care, emphasizing quality of life and symptom management especially if she is not a transplant candidate. Ongoing discussions.   Code Status: FULL   Code Status Orders  (From admission, onward)         Start     Ordered   09/03/20 0723  Full code  Continuous        09/03/20 0725        Code Status History    Date Active Date Inactive Code Status Order ID Comments User Context   08/14/2020 0130 08/21/2020 2134 Full Code [id number]  Bernadette Hoit, DO Inpatient   05/18/2020 2347 05/21/2020 2318 Full Code [id number]  Bernadette Hoit, DO ED   11/25/2017 2143 12/01/2017 2201 Full Code [id number]  Ivor Costa, MD ED   10/29/2017 1500  11/09/2017 2149 Full Code [id number]  [facility] ED   10/13/2017 2155 10/15/2017 1211 Full Code [id number]  Oswald Hillock, MD Inpatient   04/21/2017 0105 04/21/2017 2146 Full Code [id number]  Rise Patience, MD Inpatient   Advance Care Planning Activity      Prognosis:  Guarded to poor long-term with decompensated liver cirrhosis  Discharge Planning: To Be Determined: husband prefers home with home health  Care plan was discussed with Dr. Karleen Hampshire, RN, patient, husband Nicole [facility]red)  Thank you for allowing the Palliative Medicine Team to assist in the care of this patient.   Total Time 75 Prolonged Time Billed  no       Greater than 50%  of this time was spent counseling and coordinating care related to the above assessment and plan.  Ihor Dow, DNP, FNP-C Palliative Medicine Team  Phone: [phone] Fax: [phone]  Please contact Palliative Medicine Team phone at [phone] for questions and concerns.

## 2020-09-04 NOTE — ED Notes (Signed)
Pt placed on bedpan.  Pt cleaned, new brief applied. Pt repositioned in bed for comfort and denied any other needs at this time. Call bell within reach, bed in low position. Will continue to monitor.

## 2020-09-05 DIAGNOSIS — K7031 Alcoholic cirrhosis of liver with ascites: Secondary | ICD-10-CM

## 2020-09-05 LAB — COMPREHENSIVE METABOLIC PANEL
ALT: 19 U/L (ref 0–44)
AST: 36 U/L (ref 15–41)
Albumin: 2.8 g/dL — ABNORMAL LOW (ref 3.5–5.0)
Alkaline Phosphatase: 88 U/L (ref 38–126)
Anion gap: 9 (ref 5–15)
BUN: 6 mg/dL — ABNORMAL LOW (ref 8–23)
CO2: 25 mmol/L (ref 22–32)
Calcium: 8.1 mg/dL — ABNORMAL LOW (ref 8.9–10.3)
Chloride: 96 mmol/L — ABNORMAL LOW (ref 98–111)
Creatinine, Ser: 0.57 mg/dL (ref 0.44–1.00)
GFR, Estimated: >60 mL/min (ref >60)
Glucose, Bld: 73 mg/dL (ref 70–99)
Potassium: 3.1 mmol/L — ABNORMAL LOW (ref 3.5–5.1)
Sodium: 130 mmol/L — ABNORMAL LOW (ref 135–145)
Total Bilirubin: 2.1 mg/dL — ABNORMAL HIGH (ref 0.3–1.2)
Total Protein: 5.2 g/dL — ABNORMAL LOW (ref 6.5–8.1)

## 2020-09-05 LAB — PROTIME-INR
INR: 1.6 — ABNORMAL HIGH (ref 0.8–1.2)
Prothrombin Time: 18.6 seconds — ABNORMAL HIGH (ref 11.4–15.2)

## 2020-09-05 LAB — CBC
HCT: 26.7 % — ABNORMAL LOW (ref 36.0–46.0)
Hemoglobin: 7.9 g/dL — ABNORMAL LOW (ref 12.0–15.0)
MCH: 25.2 pg — ABNORMAL LOW (ref 26.0–34.0)
MCHC: 29.6 g/dL — ABNORMAL LOW (ref 30.0–36.0)
MCV: 85.3 fL (ref 80.0–100.0)
Platelets: 34 10*3/uL — ABNORMAL LOW (ref 150–400)
RBC: 3.13 MIL/uL — ABNORMAL LOW (ref 3.87–5.11)
RDW: 18.2 % — ABNORMAL HIGH (ref 11.5–15.5)
WBC: 4.8 10*3/uL (ref 4.0–10.5)
nRBC: 0 % (ref 0.0–0.2)

## 2020-09-05 LAB — MAGNESIUM: Magnesium: 1.9 mg/dL (ref 1.7–2.4)

## 2020-09-05 MED ORDER — IBUPROFEN 400 MG PO TABS
400.0000 mg | ORAL_TABLET | Freq: Once | ORAL | Status: AC
  Administered 2020-09-05: 400 mg via ORAL
  Filled 2020-09-05: qty 1

## 2020-09-05 MED ORDER — SODIUM CHLORIDE 0.9 % IV SOLN: INTRAVENOUS | Status: DC | PRN

## 2020-09-05 MED ORDER — POTASSIUM CHLORIDE CRYS ER 20 MEQ PO TBCR: 20.0000 meq | EXTENDED_RELEASE_TABLET | Freq: Every day | ORAL | Status: DC

## 2020-09-05 MED ORDER — POTASSIUM CHLORIDE CRYS ER 20 MEQ PO TBCR
40.0000 meq | EXTENDED_RELEASE_TABLET | Freq: Once | ORAL | Status: AC
  Administered 2020-09-05: 40 meq via ORAL
  Filled 2020-09-05: qty 2

## 2020-09-05 NOTE — TOC Initial Note (Signed)
Transition of Care [facility]) - Initial/Assessment Note   Patient Details  Name: Megan Roberson MRN: [id number] Date of Birth: [birth year]  Transition of Care [facility]) CM/SW Contact:    Sherie Don, LCSW Phone Number: 09/05/2020, 3:53 PM  Clinical Narrative: Patient is a 64 year old female for was admitted for ascites. Readmission checklist completed due to high readmission score. PT evaluation recommended SNF, but patient has not had enough wellness days to return. CSW spoke with patient regarding [place] vs. SNF and patient is agreeable to [facility] referral to [facility]. CSW made referral to Tim with [facility]. CSW discussed PCP appointment. Per patient, she is seeing a new PCP on November 8, but is unable to remember their name. TOC received additional consult from palliative for patient to be referred to OP palliative. Patient is agreeable to referral to [place]. CSW made OP palliative referral to Audrea Muscat with [facility]. TOC to follow.  Expected Discharge Plan: [place] Barriers to Discharge: Continued Medical Work up  Patient Goals and CMS Choice Patient states their goals for this hospitalization and ongoing recovery are:: Discharge home CMS Medicare.gov Compare Post Acute Care list provided to:: Patient Choice offered to / list presented to : Patient  Expected Discharge Plan and Services Expected Discharge Plan: [place] In-house Referral: Clinical Social Work Discharge Planning Services: NA Post Acute Care Choice: [place] arrangements for the past 2 months: Single Family Home            DME Arranged: N/A DME Agency: NA HH Arranged: PT HH Agency: [place] Date Standard: 09/05/20 Time [place] Agency Contacted: [id number] Representative spoke with at [place]: Tim  Prior Living Arrangements/Services Living arrangements for the past 2 months: Single Family Home Lives with:: Spouse Patient language and need for interpreter  reviewed:: Yes Do you feel safe going back to the place where you live?: Yes      Need for Family Participation in Patient Care: No (Comment) Care giver support system in place?: Yes (comment) Current home services: DME [facility] Rile, [facility], wheelchair) Criminal Activity/Legal Involvement Pertinent to Current Situation/Hospitalization: No - Comment as needed  Activities of Daily Living Home Assistive Devices/Equipment: Environmental consultant (specify type) ADL Screening (condition at time of admission) Patient's cognitive ability adequate to safely complete daily activities?: Yes Is the patient deaf or have difficulty hearing?: No Does the patient have difficulty seeing, even when wearing glasses/contacts?: No Does the patient have difficulty concentrating, remembering, or making decisions?: No Patient able to express need for assistance with ADLs?: Yes Does the patient have difficulty dressing or bathing?: Yes Independently performs ADLs?: Yes (appropriate for developmental age) Does the patient have difficulty walking or climbing stairs?: Yes Weakness of Legs: Both Weakness of Arms/Hands: Both  Permission Sought/Granted Permission sought to share information with : Other (comment) Permission granted to share info w AGENCY: HH  Emotional Assessment Appearance:: Appears stated age Attitude/Demeanor/Rapport: Engaged Affect (typically observed): Accepting Orientation: : Oriented to Self, Oriented to Place, Oriented to  Time, Oriented to Situation Alcohol / Substance Use: Not Applicable Psych Involvement: No (comment)  Admission diagnosis:  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 Ascites 09/03/2020  . Closed left ankle fracture 09/03/2020  . Mood disorder ([place]) 08/17/2020  . Right lower lobe pneumonia 08/16/2020  . Asymptomatic bacteriuria   . Hypothyroidism 08/14/2020  . Displaced fracture  of medial cuneiform  of left foot, initial encounter for closed fracture 08/14/2020  . Failure to thrive in adult 08/14/2020  . Weight loss 08/14/2020  . UTI (urinary tract infection) 08/14/2020  . Fall at home, initial encounter 08/14/2020  . Generalized weakness 08/13/2020  . Pain in right knee 06/04/2020  . Pain in left hip 06/04/2020  . Nausea 05/19/2020  . Prolonged QT interval 05/19/2020  . Elevated brain natriuretic peptide (BNP) level 05/19/2020  . Hypoalbuminemia 05/19/2020  . Cough 05/19/2020  . Atelectasis 05/19/2020  . Hypomagnesemia 05/19/2020  . Cysts of both ovaries 06/15/2019  . Closed fracture of left tibial plateau 05/17/18 07/08/2018  . Esophageal varices without bleeding ([place])   . Osteoporosis 02/09/2018  . Goals of care, counseling/discussion 01/18/2018  . Refractory ascites 11/25/2017  . Cellulitis, abdominal wall 11/25/2017  . SOB (shortness of breath) 11/25/2017  . Protein-calorie malnutrition, severe ([place]) 11/06/2017  . Ogilvie's syndrome   . Abdominal distension   . Abdominal pain   . Liver failure without hepatic coma ([place])   . Thrombocytopenia ([place])   . Palliative care by specialist   . Closed fracture of lumbar spine without lesion of spinal cord ([place])   . Cirrhosis of liver with ascites ([place]) 04/21/2017  . Pancytopenia ([place]) 04/21/2017  . History of open reduction and internal fixation (ORIF) procedure 04/21/2017  . Visit for suture removal 04/21/2017  . Left Olecranon fracture 04/21/2017  . Hypokalemia 04/21/2017   PCP:  Celene Squibb, MD Pharmacy:   [id number]: [phone]: [phone] Readmission Risk Interventions Readmission Risk Prevention Plan 09/05/2020 08/14/2020  Transportation Screening Complete Complete  PCP or Specialist Appt within 3-5 Days Complete Not Complete  Not Complete comments - Anticipating discharge to SNF  [place]  Social Work Consult for [place]re[place]anning/Counseling Complete Complete  Palliative Care Screening Complete Not Applicable  Medication Review (Press photographer Complete Complete  Some recent data might be hidden

## 2020-09-05 NOTE — Progress Notes (Signed)
PROGRESS NOTE    Megan Roberson  JJH:[id number] DOB: 1956/03/16 DOA: 09/02/2020 PCP: Celene Squibb, MD    Chief Complaint  Patient presents with  . Abdominal Pain    Brief Narrative:  65 year old lady prior history of [place] cirrhosis, esophageal varices, hepatic encephalopathy, generalized anxiety disorder, peripheral neuropathy, thrombocytopenia, hypothyroidism, depression, ascites, follows up with GI as an outpatient presents to ED for worsening abdominal distention.  On arrival to ED she was found to have significant ascites.  CT of the abdomen pelvis which shows gaseous distention of the colon without any acute findings.  She underwent ultrasound paracentesis and 5.5 L of ascitic fluid removed. Fluid analysis and cultures are negative for SBP.  GI on board to assist with liver cirrhosis and ascites.   OF note pt was evaluated by [facility] transplant hepatology with [facility] clinic, needed to complete neurology consultation and return to the clinic but she never made th appt.    Assessment & Plan:   Principal Problem:   Ascites Active Problems:   Cirrhosis of liver with ascites (HCC)   Abdominal pain   Thrombocytopenia (HCC)   Palliative care by specialist   Protein-calorie malnutrition, severe ([place])   Goals of care, counseling/discussion   Osteoporosis   Hypoalbuminemia   Generalized weakness   Hypothyroidism   UTI (urinary tract infection)   Mood disorder (HCC)   Closed left ankle fracture   Liver cirrhosis with ascites S/p ultrasound paracentesis with removal of 5.5 L of clear liquid fluid.  She received 50 g of IV albumin post paracentesis.  Ascites fluid sent for analysis and culture,negative for SBP. Lasix changed to IV lasix 40 mg daily along with spironolactone daily.  Continue with his rifaximin. Gastroenterology on board to assist with management.  Her MELD score is around 20 today . Her abdomen is tense and she reports abd discomfort along with  ankle pain.      Hepatic encephalopathy Probably secondary to noncompliance to lactulose. Improving, she is alert and oriented to place and person .  Continue with lactulose 3 times daily with goal of 3-4 bowel movements per day. Continue with rifaximin BID.      Thickening of her colon/ascending colon Probably secondary to liver cirrhosis. Outpatient follow-up with a colonoscopy    Esophageal varices Patient will need outpatient follow-up for an EGD for surveillance of varices.   Community-acquired pneumonia/  Continue with Rocephin and doxycycline.   Hypothyroidism Continue with Synthroid.   Severe protein calorie malnutrition Dietary consult  Hypokalemia:  Replaced.    Muscle spasms probably secondary to hypokalemia:  Replete potassium.   Thrombocytopenia Probably secondary to liver cirrhosis. Platelets continue to drop. Monitor for bleeding.    In view of her multiple medical problems palliative care consulted for goals of care   DVT prophylaxis: SCDs  code Status: (Full code Family Communication: None at bedside Disposition:   Status is: Inpatient  Remains inpatient appropriate because:Ongoing diagnostic testing needed not appropriate for outpatient work up, IV treatments appropriate due to intensity of illness or inability to take PO and Inpatient level of care appropriate due to severity of illness   Dispo: The patient is from: Home              Anticipated d/c is to: Pending              Anticipated d/c date is: 2 days              Patient currently is  not medically stable to d/c.       Consultants:   Gastroenterology  Palliative care  Procedures: Ultrasound paracentesis Antimicrobials: IV Rocephin  Subjective: Patient reports being nauseated, abdominal pain is persistent.  No chest pain or shortness of breath  Objective: Vitals:   09/04/20 1851 09/04/20 2053 09/05/20 0456 09/05/20 0457  BP: 107/65 116/75 101/73   Pulse: 87  86 75   Resp: 18 18 17    Temp: 98.8 F (37.1 C) 98.9 F (37.2 C) 98 F (36.7 C)   TempSrc: Axillary Oral Oral   SpO2: 96% 97% 99%   Weight:    55.9 kg  Height:        Intake/Output Summary (Last 24 hours) at 09/05/2020 1233 Last data filed at 09/05/2020 0700 Gross per 24 hour  Intake 1014 ml  Output 3000 ml  Net -1986 ml   Filed Weights   09/02/20 2347 09/04/20 0819 09/05/20 0457  Weight: 54 kg 57 kg 55.9 kg    Examination:  General exam: chornically ill appearing lady, in mild discomfort from abd discomfort and distention.  Respiratory system: diminished at bases, no tachypnea, no wheezing heard.  Cardiovascular system: S1 & S2 heard, RRR. No JVD, murmurs, No pedal edema. Gastrointestinal system: Abdomen is tense, distended, mild discomfort, bowel sounds wnl.  Central nervous system: Alert and oriented. No focal neurological deficits. Extremities: No pedal edema present left ankle bandaged.  Skin: No ulcers seen Psychiatry: flat affect.     Data Reviewed: I have personally reviewed following labs and imaging studies  CBC: Recent Labs  Lab 09/03/20 0015 09/04/20 0540 09/05/20 0412  WBC 8.9 5.2 4.8  NEUTROABS 6.6  --   --   HGB 8.1* 7.0* 7.9*  HCT 28.3* 24.3* 26.7*  MCV 87.9 87.7 85.3  PLT 47* 36* 34*    Basic Metabolic Panel: Recent Labs  Lab 09/03/20 0015 09/04/20 0540 09/05/20 0412  NA 136 132* 130*  K 3.7 3.4* 3.1*  CL 109 101 96*  CO2 22 24 25   GLUCOSE 74 77 73  BUN 9 7* 6*  CREATININE 0.55 0.50 0.57  CALCIUM 7.9* 8.2* 8.1*  MG  --  1.6* 1.9    GFR: Estimated Creatinine Clearance: 65.2 mL/min (by C-G formula based on SCr of 0.57 mg/dL).  Liver Function Tests: Recent Labs  Lab 09/03/20 0015 09/04/20 0540 09/05/20 0412  AST 42* 34 36  ALT 27 20 19   ALKPHOS 115 90 88  BILITOT 2.5* 2.4* 2.1*  PROT 6.0* 5.4* 5.2*  ALBUMIN 2.6* 3.0* 2.8*    CBG: No results for input(s): GLUCAP in the last 168 hours.   Recent Results (from the past  240 hour(s))  Urine culture     Status: None   Collection Time: 09/03/20 12:23 AM   Specimen: Urine, Catheterized  Result Value Ref Range Status   Specimen Description   Final    URINE, CATHETERIZED Performed at [facility], [address], [place], [place] [address]    Special Requests   Final    NONE Performed at [facility], [address] [place], [place] [address]    Culture   Final    NO GROWTH Performed at [facility], [place] [address] [place], [place] [address]    Report Status 09/04/2020 FINAL  Final  Respiratory Panel by RT PCR (Flu A&B, Covid) - Nasopharyngeal Swab     Status: None   Collection Time: 09/03/20  5:11 AM   Specimen: Nasopharyngeal Swab  Result  Value Ref Range Status   SARS Coronavirus 2 by RT PCR NEGATIVE NEGATIVE Final    Comment: (NOTE) SARS-CoV-2 target nucleic acids are NOT DETECTED.  The SARS-CoV-2 RNA is generally detectable in upper respiratoy specimens during the acute phase of infection. The lowest concentration of SARS-CoV-2 viral copies this assay can detect is 131 copies/mL. A negative result does not preclude SARS-Cov-2 infection and should not be used as the sole basis for treatment or other patient management decisions. A negative result may occur with  improper specimen collection/handling, submission of specimen other than nasopharyngeal swab, presence of viral mutation(s) within the areas targeted by this assay, and inadequate number of viral copies (<131 copies/mL). A negative result must be combined with clinical observations, patient history, and epidemiological information. The expected result is Negative.  Fact Sheet for Patients:  PinkCheek.be  Fact Sheet for Healthcare Providers:  GravelBags.it  This test is no t yet approved or cleared by the [place] FDA and  has been authorized for detection and/or diagnosis of SARS-CoV-2 by FDA under an Emergency  Use Authorization (EUA). This EUA will remain  in effect (meaning this test can be used) for the duration of the COVID-19 declaration under Section 564(b)(1) of the Act, 21 U.S.C. section 360bbb-3(b)(1), unless the authorization is terminated or revoked sooner.     Influenza A by PCR NEGATIVE NEGATIVE Final   Influenza B by PCR NEGATIVE NEGATIVE Final    Comment: (NOTE) The Xpert Xpress SARS-CoV-2/FLU/RSV assay is intended as an aid in  the diagnosis of influenza from Nasopharyngeal swab specimens and  should not be used as a sole basis for treatment. Nasal washings and  aspirates are unacceptable for Xpert Xpress SARS-CoV-2/FLU/RSV  testing.  Fact Sheet for Patients: PinkCheek.be  Fact Sheet for Healthcare Providers: GravelBags.it  This test is not yet approved or cleared by the [place] FDA and  has been authorized for detection and/or diagnosis of SARS-CoV-2 by  FDA under an Emergency Use Authorization (EUA). This EUA will remain  in effect (meaning this test can be used) for the duration of the  Covid-19 declaration under Section 564(b)(1) of the Act, 21  U.S.C. section 360bbb-3(b)(1), unless the authorization is  terminated or revoked. Performed at [facility], [address], [place], [place] [address]   Gram stain     Status: None   Collection Time: 09/03/20  9:44 AM   Specimen: Ascitic; Body Fluid  Result Value Ref Range Status   Specimen Description ASCITIC  Final   Special Requests Immunocompromised  Final   Gram Stain   Final    NO ORGANISMS SEEN WBC PRESENT, PREDOMINANTLY MONONUCLEAR Performed at [facility], [address], [place], [place] [address]    Report Status 09/03/2020 FINAL  Final  Culture, body fluid-bottle     Status: None (Preliminary result)   Collection Time: 09/03/20  9:44 AM   Specimen: Ascitic  Result Value Ref Range Status   Specimen Description ASCITIC  Final   Special  Requests 10CC  Final   Culture   Final    NO GROWTH < 24 HOURS Performed at [facility], [address] [place], [place] [address]    Report Status PENDING  Incomplete         Radiology Studies: No results found.      Scheduled Meds: . Chlorhexidine Gluconate Cloth  6 each Topical Daily  . doxycycline  100 mg Oral Q12H  . feeding supplement  237 mL Oral BID BM  .  furosemide  40 mg Intravenous Daily  . lactulose  20 g Oral TID  . levothyroxine  75 mcg Oral QAC breakfast  . multivitamin with minerals  1 tablet Oral Daily  . rifaximin  550 mg Oral BID  . spironolactone  150 mg Oral Daily   Continuous Infusions: . albumin human 50 g (09/05/20 0859)  . cefTRIAXone (ROCEPHIN)  IV Stopped (09/04/20 0939)     LOS: 2 days        Hosie Poisson, MD [facility]   To contact the attending provider between 7A-7P or the covering provider during after hours 7P-7A, please log into the web site [email or web address] and access using universal [place] password for that web site. If you do not have the password, please call the hospital operator.  09/05/2020, 12:33 PM

## 2020-09-05 NOTE — Progress Notes (Signed)
[facility] ([facility])  Hospital Liaison: RN note         Notified by TOC manager of patient/family request for [facility] Palliative services at home after discharge.                  [facility] Palliative team will follow up with patient after discharge.         Please call with any hospice or palliative related questions.         Thank you for this referral.         Mary Anne Robertson, RN, CCM  [facility] Hospital Liaison (listed on AMION under Hospice[facility])    [phone]   

## 2020-09-05 NOTE — Plan of Care (Signed)
  Problem: Acute Rehab PT Goals(only PT should resolve) Goal: Pt Will Go Supine/Side To Sit Outcome: Progressing Flowsheets (Taken 09/05/2020 1457) Pt will go Supine/Side to Sit:  with min guard assist  with minimal assist Goal: Patient Will Transfer Sit To/From Stand Outcome: Progressing Flowsheets (Taken 09/05/2020 1457) Patient will transfer sit to/from stand: with minimal assist Goal: Pt Will Transfer Bed To Chair/Chair To Bed Outcome: Progressing Flowsheets (Taken 09/05/2020 1457) Pt will Transfer Bed to Chair/Chair to Bed: with min assist Goal: Pt Will Ambulate Outcome: Progressing Flowsheets (Taken 09/05/2020 1457) Pt will Ambulate:  15 feet  with minimal assist  with moderate assist  with rolling walker   2:57 PM, 09/05/20 Lonell Grandchild, MPT Physical Therapist with [facility] [phone] office [phone] mobile phone

## 2020-09-05 NOTE — Evaluation (Signed)
Physical Therapy Evaluation Patient Details Name: Megan Roberson MRN: [id number] DOB: 1956/10/08 Today's Date: 09/05/2020   History of Present Illness  Megan Roberson is a 64 y.o. 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  She reports ports that she had not been taking her Lasix due to problems with hypokalemia.  She reports that subsequently she developed rapidly increasing abdominal distention.  She has been having abdominal pain as well.  She also reports she has not been able to take her lactulose regularly because of difficulty with getting to the bathroom.  She complains of shortness of breath and abdominal distention nominal distention and subsequent abdominal pain.  She had reached out to her gastroenterologist and had planned for an outpatient paracentesis in the next 1 to 2 days however her symptoms became so severe that she could not wait and she presented to the emergency department for some type of treatment.    Clinical Impression  Patient has difficulty moving LLE during bed mobility, demonstrates poor tolerance and limited time for standing due to increased pain left ankle/foot, at high risk for falls and tolerated sitting up in chair after therapy.  Patient will benefit from continued physical therapy in hospital and recommended venue below to increase strength, balance, endurance for safe ADLs and gait.    Follow Up Recommendations SNF    Equipment Recommendations  None recommended by PT    Recommendations for Other Services       Precautions / Restrictions Precautions Precautions: Fall Restrictions Weight Bearing Restrictions:  Yes LLE Weight Bearing: Weight bearing as tolerated      Mobility  Bed Mobility Overal bed mobility: Needs Assistance Bed Mobility: Supine to Sit     Supine to sit: Min assist;Mod assist     General bed mobility comments: increased time, labored movement    Transfers Overall transfer level: Needs assistance Equipment used: Rolling walker (2 wheeled) Transfers: Sit to/from [facility] Sit to Stand: Mod assist Stand pivot transfers: Mod assist       General transfer comment: unsteady labored movement  Ambulation/Gait Ambulation/Gait assistance: Mod assist;Max assist Gait Distance (Feet): 4 Feet Assistive device: Rolling walker (2 wheeled) Gait Pattern/deviations: Decreased step length - right;Decreased step length - left;Decreased stance time - left;Decreased stride length;Antalgic Gait velocity: decreased   General Gait Details: limited to 4-5 slow labored unsteady steps at bedside with poor tolerance for weightbearing on LLE due to increased ankle pain  Stairs            Wheelchair Mobility    Modified Rankin (Stroke Patients Only)       Balance Overall balance assessment: Needs assistance Sitting-balance support: Feet supported;No upper extremity supported Sitting balance-Leahy Scale: Fair Sitting balance - Comments: seated EOB   Standing balance support: During functional activity;Bilateral upper extremity supported Standing balance-Leahy Scale: Poor Standing balance comment: using RW                             Pertinent Vitals/Pain Pain Assessment: Faces Faces Pain Scale: Hurts even more Pain Location: abdomen and left ankle Pain Descriptors / Indicators: Sore;Grimacing;Guarding Pain Intervention(s): Limited activity within patient's tolerance;Monitored during session;Repositioned    Home Living Family/patient expects to be discharged to:: Private  residence Living Arrangements: Spouse/significant other Available  Help at Discharge: Family;Available PRN/intermittently Type of Home: House Home Access: Stairs to enter Entrance Stairs-Rails: None Entrance Stairs-Number of Steps: 2 Home Layout: One level Home Equipment: Walker - 2 wheels;Wheelchair - manual;Shower seat;Bedside commode;Cane - single point      Prior Function Level of Independence: Needs assistance   Gait / Transfers Assistance Needed: Supervised transfers, Min assist for short household distances using RW  ADL's / Homemaking Assistance Needed: assisted by spouse        Hand Dominance   Dominant Hand: Right    Extremity/Trunk Assessment   Upper Extremity Assessment Upper Extremity Assessment: Generalized weakness    Lower Extremity Assessment Lower Extremity Assessment: Generalized weakness    Cervical / Trunk Assessment Cervical / Trunk Assessment: Kyphotic  Communication   Communication: No difficulties  Cognition Arousal/Alertness: Awake/alert Behavior During Therapy: WFL for tasks assessed/performed Overall Cognitive Status: Within Functional Limits for tasks assessed                                        General Comments      Exercises     Assessment/Plan    PT Assessment Patient needs continued PT services  PT Problem List Decreased strength;Decreased activity tolerance;Decreased balance;Decreased mobility       PT Treatment Interventions Balance training;Gait training;DME instruction;Stair training;Functional mobility training;Therapeutic activities;Therapeutic exercise;Patient/family education    PT Goals (Current goals can be found in the Care Plan section)  Acute Rehab PT Goals Patient Stated Goal: return home with spouse to assist PT Goal Formulation: With patient Time For Goal Achievement: 09/19/20 Potential to Achieve Goals: Good    Frequency Min 3X/week   Barriers to discharge        Co-evaluation               AM-PAC PT "6 Clicks" Mobility  Outcome  Measure Help needed turning from your back to your side while in a flat bed without using bedrails?: A Lot Help needed moving from lying on your back to sitting on the side of a flat bed without using bedrails?: A Lot Help needed moving to and from a bed to a chair (including a wheelchair)?: A Lot Help needed standing up from a chair using your arms (e.g., wheelchair or bedside chair)?: A Lot Help needed to walk in hospital room?: A Lot Help needed climbing 3-5 steps with a railing? : Total 6 Click Score: 11    End of Session   Activity Tolerance: Patient tolerated treatment well;Patient limited by fatigue Patient left: in chair;with call bell/phone within reach Nurse Communication: Mobility status PT Visit Diagnosis: Unsteadiness on feet (R26.81);Other abnormalities of gait and mobility (R26.89);Muscle weakness (generalized) (M62.81)    Time: 0998-3382 PT Time Calculation (min) (ACUTE ONLY): 29 min   Charges:   PT Evaluation $PT Eval Moderate Complexity: 1 Mod PT Treatments $Therapeutic Activity: 23-37 mins        2:55 PM, 09/05/20 Lonell Grandchild, MPT Physical Therapist with [facility] 336 [phone] office [phone] mobile phone

## 2020-09-05 NOTE — Progress Notes (Signed)
Subjective:  No N/V. BM 3 per day. Eating and drinking but in small amounts. Patient states her ascites is reaccumulating fast.  Husband states she was much bigger prior to her last paracentesis.  Denies shortness of breath.  Objective: Vital signs in last 24 hours: Temp:  [98 F (36.7 C)-99 F (37.2 C)] 98 F (36.7 C) (11/04 0456) Pulse Rate:  [75-89] 75 (11/04 0456) Resp:  [17-20] 17 (11/04 0456) BP: (101-121)/(65-78) 101/73 (11/04 0456) SpO2:  [96 %-99 %] 99 % (11/04 0456) Weight:  [55.9 kg] 55.9 kg (11/04 0457) Last BM Date: 09/04/20 General:   Alert,  Frail appearing, pleasant and cooperative in NAD.  Husband at bedside. Head:  Normocephalic and atraumatic. Eyes:  Sclera clear, no icterus.  Abdomen:  Soft, distended but not tense.  Diffuse tenderness with palpation. Normal bowel sounds, without guarding, and without rebound.   Extremities:  Without clubbing,  edema.  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  Also with deformity of her toes of both feet. Neurologic:  Alert and  oriented x4;  grossly normal neurologically.  No asterixis Skin:  Intact without significant lesions or rashes. Psych:  Alert and cooperative. Normal mood and affect.  Intake/Output from previous day: 11/03 0701 - 11/04 0700 In: 1254 [P.O.:720; Blood:384; IV Piggyback:150] Out: 3000 [Urine:3000] Intake/Output this shift: No intake/output data recorded.  Lab Results: CBC Recent Labs    09/03/20 0015 09/04/20 0540 09/05/20 0412  WBC 8.9 5.2 4.8  HGB 8.1* 7.0* 7.9*  HCT 28.3* 24.3* 26.7*  MCV 87.9 87.7 85.3  PLT 47* 36* 34*   BMET Recent Labs    09/03/20 0015 09/04/20 0540 09/05/20 0412  NA 136 132* 130*  K 3.7 3.4* 3.1*  CL 109 101 96*  CO2 22 24 25   GLUCOSE 74 77 73  BUN 9 7* 6*  CREATININE 0.55 0.50 0.57  CALCIUM 7.9* 8.2* 8.1*   LFTs Recent Labs    09/03/20 0015 09/04/20 0540 09/05/20 0412  BILITOT 2.5* 2.4* 2.1*  ALKPHOS 115 90 88  AST 42*  34 36  ALT 27 20 19   PROT 6.0* 5.4* 5.2*  ALBUMIN 2.6* 3.0* 2.8*   Recent Labs    09/03/20 0015  LIPASE 28   PT/INR Recent Labs    09/03/20 0015 09/04/20 0540 09/05/20 0412  LABPROT 17.7* 19.4* 18.6*  INR 1.5* 1.7* 1.6*      Imaging Studies: CT Head Wo Contrast  Result Date: 08/13/2020 CLINICAL DATA:  Fall, head injury EXAM: CT HEAD WITHOUT CONTRAST TECHNIQUE: Contiguous axial images were obtained from the base of the skull through the vertex without intravenous contrast. COMPARISON:  None. FINDINGS: Brain: Normal anatomic configuration. Parenchymal volume loss is commensurate with the patient's age. No abnormal intra or extra-axial mass lesion or fluid collection. No abnormal mass effect or midline shift. No evidence of acute intracranial hemorrhage or infarct. Ventricular size is normal. Cerebellum unremarkable. Vascular: No asymmetric hyperdense vasculature at the skull base. Skull: Intact Sinuses/Orbits: Paranasal sinuses are clear. Orbits are unremarkable. Other: Mastoid air cells and middle ear cavities are clear. IMPRESSION: No acute intracranial injury.  No calvarial fracture. Electronically Signed   By: Fidela Salisbury MD   On: 08/13/2020 22:06   CT CHEST W CONTRAST  Result Date: 08/16/2020 CLINICAL DATA:  Possible pneumonia on abdomen and pelvis CT scan yesterday which was performed for abdominal pain and distension. EXAM: CT CHEST WITH CONTRAST TECHNIQUE: Multidetector CT imaging of the chest was performed during  intravenous contrast administration. CONTRAST:  75 mL OMNIPAQUE IOHEXOL 300 MG/ML  SOLN COMPARISON:  CT abdomen and pelvis 08/15/2020. CT chest, abdomen and pelvis 07/07/2018. FINDINGS: Cardiovascular: No significant vascular findings. Normal heart size. No pericardial effusion. Mediastinum/Nodes: No enlarged mediastinal, hilar, or axillary lymph nodes. Thyroid gland, trachea, and esophagus demonstrate no significant findings. Lungs/Pleura: Small bilateral pleural  effusions are noted. Small locule of air in the patient's left effusion seen on the patient's CT scan yesterday is no longer present. There is airspace disease in the right lower lobe as seen on yesterday's CT with an appearance most consistent with pneumonia. Upper Abdomen: Cirrhosis, small volume of ascites and changes of portal venous hypertension of ascites again seen. See report of CT scan yesterday. Musculoskeletal: No acute abnormality. Mild, remote superior endplate compression fracture of T8 is seen. The patient also has a remote compression fracture of L1 which is partially imaged. IMPRESSION: Right lower lobe airspace disease has an appearance most consistent with pneumonia and is unchanged since yesterday's exam. Very small bilateral pleural effusions. No air is seen in the left effusion on today's study. Cirrhosis, small volume of abdominal ascites and portal venous hypertension as seen on CT scan yesterday. Electronically Signed   By: Inge Rise M.D.   On: 08/16/2020 15:13   CT ABDOMEN PELVIS W CONTRAST  Result Date: 09/03/2020 CLINICAL DATA:  Abdominal distension, hematuria EXAM: CT ABDOMEN AND PELVIS WITH CONTRAST TECHNIQUE: Multidetector CT imaging of the abdomen and pelvis was performed using the standard protocol following bolus administration of intravenous contrast. CONTRAST:  50m OMNIPAQUE IOHEXOL 300 MG/ML  SOLN COMPARISON:  08/15/2020 FINDINGS: Lower chest: Ground-glass pulmonary infiltrate within the right lower lobe has improved slightly in the interval since prior examination, likely infectious in etiology. Moderate left pleural effusion is present, increased in size since prior exam. Compressive atelectasis of the left lower lobe noted. Cardiac size within normal limits. Gastroesophageal varices are noted. Hepatobiliary: Cirrhosis. No enhancing intrahepatic mass is clearly identified. There is dilation of the right superior portal vein likely related to an underlying  intrahepatic portal venous shunt. Cholelithiasis again noted without obvious pericholecystic inflammatory change, though evaluation is slightly limited by the presence of ascites. No intra or extrahepatic biliary ductal dilation. Pancreas: Unremarkable Spleen: The spleen is enlarged, unchanged from prior examination, measuring 15.6 cm in greatest dimension, in keeping with changes of portal venous hypertension. No intrasplenic lesions are seen. The splenic vein is patent. Adrenals/Urinary Tract: The adrenal glands are unremarkable. 13 mm x 14 mm nonobstructing calculus is seen within the right renal pelvis. The kidneys are otherwise unremarkable. The bladder is decompressed with a Foley catheter balloon seen within its lumen. Stomach/Bowel: The stomach and small bowel are unremarkable. Previously noted circumferential wall thickening involving the descending and proximal sigmoid colon has resolved suggesting that the findings on prior examination were related to and a infectious or inflammatory process at that time. There has developed mild surf rental thickening, similar to the prior examination, now involving the ascending colon. No evidence of obstruction or perforation. No free intraperitoneal gas. Large volume ascites has progressed since prior examination. Vascular/Lymphatic: Recanalization of the umbilical vein is present in keeping with portosystemic collateralization. Similarly, several and rectal varices are noted within the left lateral wall of the rectum. The abdominal aorta is of normal caliber. No pathologic adenopathy within the abdomen and pelvis. Reproductive: Simple appearing cysts are seen within the adnexa bilaterally, stable since prior examination. There size does not warrant further follow-up. Uterus unremarkable.  Other: Bilateral breast implants are partially visualized. Musculoskeletal: Left hip ORIF has been performed. Healed left iliac crest fracture. Compression fractures T12 and superior  endplate fracture of L1 are unchanged from prior examination no acute bone abnormality. IMPRESSION: Slight interval improvement in probable infectious pulmonary infiltrate within the right lower lobe. Cirrhosis. 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 Gastroesophageal and perirectal portosystemic collateral varices are identified. Resolved circumferential wall thickening involving the distal colon noted on prior examination, likely infectious or inflammatory process at that time. New similar appearing mural thickening involving the ascending colon, likely infectious or inflammatory. Given its restricted territory, portal colopathy is considered less likely. Electronically Signed   By: Fidela Salisbury MD   On: 09/03/2020 02:48   CT ABDOMEN PELVIS W CONTRAST  Result Date: 08/15/2020 CLINICAL DATA:  Abdominal distension, pain. Cirrhosis. Paracentesis performed on 08/14/2020 EXAM: CT ABDOMEN AND PELVIS WITH CONTRAST TECHNIQUE: Multidetector CT imaging of the abdomen and pelvis was performed using the standard protocol following bolus administration of intravenous contrast. CONTRAST:  76m OMNIPAQUE IOHEXOL 300 MG/ML  SOLN COMPARISON:  07/07/2020 FINDINGS: Lower chest: Patchy predominantly ground-glass opacity within the right lower lobe is new from prior. Small left pleural effusion with a small amount of air present within the non dependent portion of the collection (series 5, image 11). Heart size within normal limits. No pericardial effusion. Hepatobiliary: Cirrhotic hepatic morphology. No focal liver lesion is identified. There is recanalization of the umbilical vein. Portal vein is well opacified. Multiple gallstones present within the gallbladder. No biliary dilatation. Pancreas: Unremarkable. Spleen: Splenomegaly.  No focal splenic lesion. Adrenals/Urinary Tract: Unremarkable adrenal glands. 12 x 7 mm stone  remains present within the right renal pelvis. Mild right-sided hydronephrosis. No left hydronephrosis. Kidneys enhance symmetrically. Urinary bladder is unremarkable. Stomach/Bowel: There is long segment mild circumferential wall thickening predominantly involving the sigmoid and descending colon. No definite pericolonic inflammatory changes, although this evaluation is significantly limited given the presence of abdominopelvic ascites. Similar degree of wall thickening involving the cecum and ascending colon. No dilated loops of small bowel. Stomach within normal limits. Vascular/Lymphatic: Findings of portal hypertension. No acute vascular abnormality is identified. No abdominopelvic lymphadenopathy. Reproductive: Unremarkable uterus. Stable appearance of bilateral adnexal cysts, previously characterized as benign on MRI from January 2021. Other: Small to moderate volume ascites. No organized abdominopelvic collection or abscess. No pneumoperitoneum. Musculoskeletal: Posttraumatic and postsurgical changes to the left hip and left hemipelvis. Chronic thoracolumbar compression fractures as well as sacral fracture. No new or acute osseous findings. IMPRESSION: 1. New ground-glass opacity within the right lower lobe suspicious for pneumonia. 2. Small left pleural effusion now containing a small amount of air. Etiology is unclear in the absence of recent thoracentesis. Consider CT chest for further evaluation. 3. Interval development of long segment mild wall thickening within the sigmoid and descending colon. No definite pericolonic inflammatory changes, although this evaluation is significantly limited given the presence of abdominopelvic ascites. Findings may represent an infectious or inflammatory colitis versus changes related to portal colopathy. 4. Mild right-sided hydronephrosis. 12 x 7 mm stone remains present within the right renal pelvis. 5. Cirrhosis with evidence of portal hypertension including  splenomegaly and small to moderate volume ascites. 6. Cholelithiasis. Electronically Signed   By: NDavina PokeD.O.   On: 08/15/2020 17:44   [place]acentesis  Result Date: 09/03/2020 INDICATION: Cirrhosis, ascites EXAM: ULTRASOUND GUIDED DIAGNOSTIC AND THERAPEUTIC PARACENTESIS MEDICATIONS: None COMPLICATIONS: None immediate PROCEDURE:  Informed written consent was obtained from the patient after a discussion of the risks, benefits and alternatives to treatment. A timeout was performed prior to the initiation of the procedure. Initial ultrasound scanning demonstrates a large amount of ascites within the LEFT lower abdominal quadrant. The right lower abdomen was prepped and draped in the usual sterile fashion. 1% lidocaine was used for local anesthesia. Following this, a 5 [place] Yueh catheter was introduced. An ultrasound image was saved for documentation purposes. The paracentesis was performed. The catheter was removed and a dressing was applied. The patient tolerated the procedure well without immediate post procedural complication. Patient received post-procedure intravenous albumin; see nursing notes for details. FINDINGS: A total of approximately 5.5 L of clear light yellow ascitic fluid was removed. Samples were sent to the laboratory as requested by the clinical team. IMPRESSION: Successful ultrasound-guided paracentesis yielding 5.5 liters of peritoneal fluid. Electronically Signed   By: Lavonia Dana M.D.   On: 09/03/2020 10:39   US Paracentesis  Result Date: 08/14/2020 INDICATION: Ascites secondary to hepatic cirrhosis. Request for diagnostic and therapeutic paracentesis. EXAM: ULTRASOUND GUIDED PARACENTESIS MEDICATIONS: 1% lidocaine 10 mL COMPLICATIONS: None immediate. PROCEDURE: Informed written consent was obtained from the patient after a discussion of the risks, benefits and alternatives to treatment. A timeout was performed prior to the initiation of the procedure. Initial ultrasound  scanning demonstrates a moderate amount of ascites within the left lateral abdomen quadrant. The left lateral abdomen was prepped and draped in the usual sterile fashion. 1% lidocaine was used for local anesthesia. Following this, a 19 gauge, 7-cm, Yueh catheter was introduced. An ultrasound image was saved for documentation purposes. The paracentesis was performed. The catheter was removed and a dressing was applied. The patient tolerated the procedure well without immediate post procedural complication. FINDINGS: A total of approximately 2.2 L of clear yellow fluid was removed. Samples were sent to the laboratory as requested by the clinical team. IMPRESSION: Successful ultrasound-guided paracentesis yielding 2.2 liters of peritoneal fluid. Read by: Gareth Eagle, PA-C Electronically Signed   By: Marijo Conception M.D.   On: 08/14/2020 12:53   DG CHEST PORT 1 VIEW  Result Date: 09/03/2020 CLINICAL DATA:  Abdominal pain and distension.  Previous pneumonia. EXAM: PORTABLE CHEST 1 VIEW COMPARISON:  CT of the chest 08/16/2020 FINDINGS: Right-sided airspace disease is similar the prior exam. Fluid in the minor fissure and right CP angle is again seen. Minimal fluid and atelectasis is present at the left base. Heart size is normal. Lung volumes are low. IMPRESSION: 1. Stable right-sided airspace disease and effusion. Findings are concerning for pneumonia. 2. Minimal fluid and atelectasis at the left base. Electronically Signed   By: San Morelle M.D.   On: 09/03/2020 12:01   DG Abdomen Acute W/Chest  Result Date: 09/03/2020 CLINICAL DATA:  Abdominal pain, shortness of breath EXAM: DG ABDOMEN ACUTE WITH 1 VIEW CHEST COMPARISON:  08/15/2020 FINDINGS: Previously seen 12 mm right renal pelvic stone again noted. There is gaseous distention of the colon, likely mild ileus. No small bowel distention or free air. No organomegaly. Heart is normal size. Scarring at the right lung base. Left lung clear. IMPRESSION:  Mild gaseous distention of the colon, likely ileus. Right renal pelvic stone again noted. Right basilar scarring. Electronically Signed   By: Rolm Baptise M.D.   On: 09/03/2020 00:49   DG Abd Portable 1V  Result Date: 08/15/2020 CLINICAL DATA:  Abdominal pain and distension EXAM: PORTABLE ABDOMEN - 1 VIEW COMPARISON:  CT abdomen and pelvis July 07, 2020 FINDINGS: There is mild dilatation of loops of colon. There is mild wall thickening in the descending colon which may have inflammatory etiology. There is moderate stool in the colon. There is no appreciable air-fluid levels. There are gallstones in the right upper quadrant. There is a calcification in the region of the right renal pelvis measuring 1.2 x 0.8 cm. There is postoperative change in the proximal femur. No evident free air. Visualized lung bases are. IMPRESSION: Question a degree of colitis. No bowel obstruction appreciable. No free air. Gallstones evident in right upper quadrant. Visualized lung bases clear. Calcification in the region of the right renal pelvis measuring 1.2 x 0.8 cm. Obstruction at or near the right ureteropelvic junction from this apparent calculus cannot be excluded by radiography. Electronically Signed   By: Lowella Grip III M.D.   On: 08/15/2020 12:50   DG Foot Complete Left  Result Date: 08/13/2020 CLINICAL DATA:  Fall, left foot bruising EXAM: LEFT FOOT - COMPLETE 3+ VIEW COMPARISON:  None. 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 There is an acute intra-articular fracture of the probable medial navicular noted dorsally with fracture fragments in near anatomic alignment. Soft tissues are unremarkable. Joint spaces appear preserved. IMPRESSION: Tiny minimally displaced fracture of the probable medial cuneiform dorsally with fracture fragments in anatomic alignment. Electronically Signed   By: Fidela Salisbury MD   On: 08/13/2020  22:31   IR Paracentesis  Result Date: 08/08/2020 INDICATION: Patient with history of NASH cirrhosis, new onset ascites. Request to IR for diagnostic and therapeutic paracentesis. EXAM: ULTRASOUND GUIDED DIAGNOSTIC AND THERAPEUTIC PARACENTESIS MEDICATIONS: 10 mL 1% lidocaine COMPLICATIONS: None immediate. PROCEDURE: Informed written consent was obtained from the patient after a discussion of the risks, benefits and alternatives to treatment. A timeout was performed prior to the initiation of the procedure. Initial ultrasound scanning demonstrates a large amount of ascites within the right lower abdominal quadrant. The right lower abdomen was prepped and draped in the usual sterile fashion. 1% lidocaine was used for local anesthesia. Following this, a 19 gauge, 7-cm, Yueh catheter was introduced. An ultrasound image was saved for documentation purposes. The paracentesis was performed. The catheter was removed and a dressing was applied. The patient tolerated the procedure well without immediate post procedural complication. FINDINGS: A total of approximately 3.3 L of clear, light yellow fluid was removed. Samples were sent to the laboratory as requested by the clinical team. IMPRESSION: Successful ultrasound-guided paracentesis yielding 3.3 liters of peritoneal fluid. Read by Candiss Norse, PA-C Electronically Signed   By: Corrie Mckusick D.O.   On: 08/08/2020 15:24  [2 weeks]   Assessment: 64 year old female with history of decompensated cirrhosis, followed by [facility], with significant thrombocytopenia, ascites requiring abdominal paracentesis, hepatic encephalopathy, history of bleeding esophageal varices.  Admission last month with profound ascites and encephalopathy.  Presented to the emergency department November 2 with increasing abdominal distention, mildly worsened chronic abdominal pain, hematuria.  Meld sodium 15 at time of consultation. MELD Na today is 20.   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  She has been evaluated by Roosevelt Locks, transplant hepatology with [facility] at their [facility].  Last seen in March 2021.  At that time there were reservations regarding her candidacy for liver transplantation because of multiple comorbidities specifically with frequent falls, multiple fractures.  Patient did  not complete neurology consultation as recommended.  She was supposed to return in September to see Mrs. Beverley Fiedler.  Of note, per records, patient was listed for liver transplantation while living in [place] in 2017 but due to relapse in alcohol abuse (admitted with DTs), she was taken off the list.  Ascites: Underwent paracentesis November 2 with no evidence of SBP.  5.5 L removed.  Reported noncompliance of diuretics at home as patient was not sure whether she was supposed to start them in the setting of electrolyte abnormalities.  Home diuretics typically consist of Lasix 40 mg daily and spironolactone 150 mg daily.  Patient inquiring about additional paracentesis prior to discharge.  At this time does not appear that she has had significant reaccumulation to warrant LVAP today.  Can reassess in the morning.  Hepatic encephalopathy: Chronically prescribed Xifaxan 550 mg twice daily she has been taking it once daily.  She takes lactulose every other day due to limited ambulation.  No overt encephalopathy this admission.  Abdominal pain: Complains of chronic generalized abdominal pain for years, recently worsening with increased abdominal distention.  No evidence of SBP.  Abdominal film with possible ileus.  CT with no evidence of ileus.  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  This could be portal colopathy.  Colonoscopy in 2019 with redundant colon with significant looping, significant portal colopathy, internal hemorrhoids.  Anemia: Hemoglobin steadily dropping but no overt GI bleeding.  Last EGD May 2019, she had very small esophageal varices as well as nonbleeding gastric ulcers. Colonoscopy May 2019 redundant colon, severe abdominal pain.  Entire colon with severe congestion/edema consistent with changes due to portal hypertension.  Virtual colonoscopy June 2020 with no significant colon polyps, mass, stricture.  Plan: 1. 2 g sodium diet. 2. Due for EGD for variceal surveillance, can be done as an outpatient with her primary gastroenterologist. 3. Complete IV albumin and Lasix as planned. 4. Daily weights. 5. Reassess for need for LVAP prior to discharge. 6. Follow-up with primary gastroenterologist at discharge.  Laureen Ochs. Bernarda Caffey [facility] [phone] 11/4/202111:46 AM     LOS: 2 days

## 2020-09-06 ENCOUNTER — Encounter (HOSPITAL_COMMUNITY): Payer: Self-pay | Admitting: Family Medicine

## 2020-09-06 ENCOUNTER — Inpatient Hospital Stay (HOSPITAL_COMMUNITY): Payer: Medicare HMO

## 2020-09-06 DIAGNOSIS — E8809 Other disorders of plasma-protein metabolism, not elsewhere classified: Secondary | ICD-10-CM

## 2020-09-06 DIAGNOSIS — R531 Weakness: Secondary | ICD-10-CM

## 2020-09-06 DIAGNOSIS — R109 Unspecified abdominal pain: Secondary | ICD-10-CM

## 2020-09-06 DIAGNOSIS — J948 Other specified pleural conditions: Secondary | ICD-10-CM

## 2020-09-06 DIAGNOSIS — W19XXXA Unspecified fall, initial encounter: Secondary | ICD-10-CM

## 2020-09-06 LAB — COMPREHENSIVE METABOLIC PANEL
ALT: 18 U/L (ref 0–44)
AST: 33 U/L (ref 15–41)
Albumin: 3.2 g/dL — ABNORMAL LOW (ref 3.5–5.0)
Alkaline Phosphatase: 81 U/L (ref 38–126)
Anion gap: 8 (ref 5–15)
BUN: 6 mg/dL — ABNORMAL LOW (ref 8–23)
CO2: 27 mmol/L (ref 22–32)
Calcium: 8.5 mg/dL — ABNORMAL LOW (ref 8.9–10.3)
Chloride: 97 mmol/L — ABNORMAL LOW (ref 98–111)
Creatinine, Ser: 0.56 mg/dL (ref 0.44–1.00)
GFR, Estimated: >60 mL/min (ref >60)
Glucose, Bld: 79 mg/dL (ref 70–99)
Potassium: 3.1 mmol/L — ABNORMAL LOW (ref 3.5–5.1)
Sodium: 132 mmol/L — ABNORMAL LOW (ref 135–145)
Total Bilirubin: 1.8 mg/dL — ABNORMAL HIGH (ref 0.3–1.2)
Total Protein: 5.5 g/dL — ABNORMAL LOW (ref 6.5–8.1)

## 2020-09-06 LAB — PROTIME-INR
INR: 1.7 — ABNORMAL HIGH (ref 0.8–1.2)
Prothrombin Time: 19.1 seconds — ABNORMAL HIGH (ref 11.4–15.2)

## 2020-09-06 LAB — MAGNESIUM: Magnesium: 1.8 mg/dL (ref 1.7–2.4)

## 2020-09-06 LAB — CBC
HCT: 26 % — ABNORMAL LOW (ref 36.0–46.0)
Hemoglobin: 7.5 g/dL — ABNORMAL LOW (ref 12.0–15.0)
MCH: 24.7 pg — ABNORMAL LOW (ref 26.0–34.0)
MCHC: 28.8 g/dL — ABNORMAL LOW (ref 30.0–36.0)
MCV: 85.5 fL (ref 80.0–100.0)
Platelets: 38 10*3/uL — ABNORMAL LOW (ref 150–400)
RBC: 3.04 MIL/uL — ABNORMAL LOW (ref 3.87–5.11)
RDW: 18.2 % — ABNORMAL HIGH (ref 11.5–15.5)
WBC: 4 10*3/uL (ref 4.0–10.5)
nRBC: 0 % (ref 0.0–0.2)

## 2020-09-06 LAB — LIPASE, FLUID: Lipase-Fluid: <3 U/L

## 2020-09-06 MED ORDER — SPIRONOLACTONE 25 MG PO TABS
200.0000 mg | ORAL_TABLET | Freq: Every day | ORAL | Status: DC
  Administered 2020-09-07 – 2020-09-08 (×2): 200 mg via ORAL
  Filled 2020-09-06 (×2): qty 8

## 2020-09-06 MED ORDER — METHOCARBAMOL 1000 MG/10ML IJ SOLN
500.0000 mg | Freq: Three times a day (TID) | INTRAVENOUS | Status: DC | PRN
  Filled 2020-09-06: qty 5

## 2020-09-06 MED ORDER — FUROSEMIDE 40 MG PO TABS
40.0000 mg | ORAL_TABLET | Freq: Two times a day (BID) | ORAL | Status: DC
  Administered 2020-09-07 – 2020-09-08 (×3): 40 mg via ORAL
  Filled 2020-09-06 (×3): qty 1

## 2020-09-06 MED ORDER — POTASSIUM CHLORIDE CRYS ER 20 MEQ PO TBCR
40.0000 meq | EXTENDED_RELEASE_TABLET | Freq: Every day | ORAL | Status: DC
  Administered 2020-09-06: 40 meq via ORAL
  Filled 2020-09-06 (×2): qty 2

## 2020-09-06 MED ORDER — POTASSIUM CHLORIDE CRYS ER 20 MEQ PO TBCR
40.0000 meq | EXTENDED_RELEASE_TABLET | Freq: Once | ORAL | Status: AC
  Administered 2020-09-07: 40 meq via ORAL
  Filled 2020-09-06: qty 2

## 2020-09-06 MED ORDER — FUROSEMIDE 10 MG/ML IJ SOLN
40.0000 mg | Freq: Two times a day (BID) | INTRAMUSCULAR | Status: DC
  Administered 2020-09-06: 40 mg via INTRAVENOUS
  Filled 2020-09-06: qty 4

## 2020-09-06 NOTE — Sedation Documentation (Signed)
Patient tolerated right sided paracentesis procedure well today and 800 mL of clear amber colored fluid removed. No acute distress noted at completion of procedure today and patient transported back to inpatient room on the 3rd floor by the radiology transporter at this time.

## 2020-09-06 NOTE — Progress Notes (Signed)
PROGRESS NOTE    Megan Roberson  ZLD:[id number] DOB: January 25, 1956 DOA: 09/02/2020 PCP: Celene Squibb, MD    Chief Complaint  Patient presents with  . Abdominal Pain    Brief Narrative:  64 year old lady prior history of [place] cirrhosis, esophageal varices, hepatic encephalopathy, generalized anxiety disorder, peripheral neuropathy, thrombocytopenia, hypothyroidism, depression, ascites, follows up with GI as an outpatient presents to ED for worsening abdominal distention.  On arrival to ED she was found to have significant ascites.  CT of the abdomen pelvis which shows gaseous distention of the colon without any acute findings.  She underwent ultrasound paracentesis and 5.5 L of ascitic fluid removed. Fluid analysis and cultures are negative for SBP.  GI on board to assist with liver cirrhosis and ascites.   OF note pt was evaluated by [facility] transplant hepatology with [place] at [facility] clinic, needed to complete neurology consultation and return to the clinic but she never made th appt.   Patient seen and examined today underwent ultrasound paracentesis and 800 mL of clear amber-colored fluid removed with minimal improvement in her abdominal distention and discomfort.  Patient reports pain in left ankle.  X-rays of the left ankle ordered.   Assessment & Plan:   Principal Problem:   Ascites Active Problems:   Cirrhosis of liver with ascites (HCC)   Abdominal pain   Thrombocytopenia (HCC)   Palliative care by specialist   Protein-calorie malnutrition, severe ([place])   Goals of care, counseling/discussion   Osteoporosis   Hypoalbuminemia   Generalized weakness   Hypothyroidism   UTI (urinary tract infection)   Mood disorder (HCC)   Closed left ankle fracture   Liver cirrhosis with ascites S/p ultrasound paracentesis with removal of 5.5 L of clear liquid fluid.  She received 50 g of IV albumin post paracentesis.  Ascites fluid sent for analysis and culture,negative for SBP.   Repeat paracentesis today and only 800 mL of fluid removed.  IV Lasix increased to twice daily and spironolactone increased to 200 mg daily for better diuresis. Potassium supplementation added to her regimen. Continue with his rifaximin. Gastroenterology on board to assist with management.  Patient reports abdominal pain/flank pain bilateral.  Will get urine analysis for further evaluation.  It could also be abdominal wall cramps from hypokalemia   Acute hepatic encephalopathy Probably secondary to noncompliance to lactulose. Improving she is alert and oriented to place and person Continue with lactulose 3 times daily with goal of 3-4 bowel movements per day. Continue with rifaximin BID.      Thickening of her ascending colon probably from congestion from liver cirrhosis: Would explain her abdominal pain. Probably secondary to liver cirrhosis. Outpatient follow-up with a colonoscopy    Esophageal varices Patient will need outpatient follow-up for an EGD for surveillance of varices.   Community-acquired pneumonia/  She is currently on room air and reports occasional cough Complete the course of antibiotics for 5 days.   Hypothyroidism Continue with Synthroid.   Severe protein calorie malnutrition Dietary consult  Hypokalemia:  Replaced.    Muscle spasms probably secondary to hypokalemia:  Ordered extra 40 mEq of potassium in addition to 40 of daily potassium  Thrombocytopenia Probably secondary to liver cirrhosis. Platelets continue to drop.  No evidence of bleeding.  Patient reports left ankle pain from a fall more than 3 weeks ago. She reports having x-rays done and is wearing the splint.  Since yesterday patient reports her ankle pain has worsened.  X-rays of the ankle ordered today  In view of her multiple medical problems palliative care consulted for goals of care.  On further discussions outpatient palliative/hospice referral made   DVT prophylaxis:  SCDs  code Status: (Full code Family Communication: None at bedside Disposition:   Status is: Inpatient  Remains inpatient appropriate because:Ongoing diagnostic testing needed not appropriate for outpatient work up, IV treatments appropriate due to intensity of illness or inability to take PO and Inpatient level of care appropriate due to severity of illness   Dispo: The patient is from: Home              Anticipated d/c is to: Home              Anticipated d/c date is: 2 days              Patient currently is not medically stable to d/c.       Consultants:   Gastroenterology  Palliative care  Procedures: Ultrasound paracentesis Antimicrobials: IV Rocephin  Subjective: Nausea has improved reports flank pain and abdominal pain on the lateral aspect. Left ankle pain  Objective: Vitals:   09/06/20 0437 09/06/20 1216 09/06/20 1226 09/06/20 1423  BP: 101/61 (!) 94/53 (!) 93/58 108/68  Pulse: 80 84 82 81  Resp: 18 18 18 19   Temp: 98.9 F (37.2 C)   98 F (36.7 C)  TempSrc: Oral   Oral  SpO2: 97% 96% 94% 99%  Weight:      Height:        Intake/Output Summary (Last 24 hours) at 09/06/2020 1424 Last data filed at 09/06/2020 0907 Gross per 24 hour  Intake 1093.71 ml  Output 2550 ml  Net -1456.29 ml   Filed Weights   09/04/20 0819 09/05/20 0457 09/06/20 0403  Weight: 57 kg 55.9 kg 53.5 kg    Examination:  General exam: chornically ill appearing lady, in mild discomfort from abd discomfort and distention.  Respiratory system: diminished at bases, no tachypnea, no wheezing heard.  Cardiovascular system: S1 & S2 heard, RRR. No JVD, murmurs, No pedal edema. Gastrointestinal system: Abdomen is tense, distended, mild discomfort, bowel sounds wnl.  Central nervous system: Alert and oriented. No focal neurological deficits. Extremities: No pedal edema present left ankle bandaged.  Skin: No ulcers seen Psychiatry: flat affect.     Data Reviewed: I have personally  reviewed following labs and imaging studies  CBC: Recent Labs  Lab 09/03/20 0015 09/04/20 0540 09/05/20 0412 09/06/20 0559  WBC 8.9 5.2 4.8 4.0  NEUTROABS 6.6  --   --   --   HGB 8.1* 7.0* 7.9* 7.5*  HCT 28.3* 24.3* 26.7* 26.0*  MCV 87.9 87.7 85.3 85.5  PLT 47* 36* 34* 38*    Basic Metabolic Panel: Recent Labs  Lab 09/03/20 0015 09/04/20 0540 09/05/20 0412 09/06/20 0559  NA 136 132* 130* 132*  K 3.7 3.4* 3.1* 3.1*  CL 109 101 96* 97*  CO2 22 24 25 27   GLUCOSE 74 77 73 79  BUN 9 7* 6* 6*  CREATININE 0.55 0.50 0.57 0.56  CALCIUM 7.9* 8.2* 8.1* 8.5*  MG  --  1.6* 1.9 1.8    GFR: Estimated Creatinine Clearance: 62.4 mL/min (by C-G formula based on SCr of 0.56 mg/dL).  Liver Function Tests: Recent Labs  Lab 09/03/20 0015 09/04/20 0540 09/05/20 0412 09/06/20 0559  AST 42* 34 36 33  ALT 27 20 19 18   ALKPHOS 115 90 88 81  BILITOT 2.5* 2.4* 2.1* 1.8*  PROT 6.0* 5.4* 5.2* 5.5*  ALBUMIN 2.6* 3.0* 2.8* 3.2*    CBG: No results for input(s): GLUCAP in the last 168 hours.   Recent Results (from the past 240 hour(s))  Urine culture     Status: None   Collection Time: 09/03/20 12:23 AM   Specimen: Urine, Catheterized  Result Value Ref Range Status   Specimen Description   Final    URINE, CATHETERIZED Performed at [facility], [address], [place], [place] [address]    Special Requests   Final    NONE Performed at [facility], [address] [place], [place] [address]    Culture   Final    NO GROWTH Performed at [facility], [place] [address] [place], [place] [address]    Report Status 09/04/2020 FINAL  Final  Respiratory Panel by RT PCR (Flu A&B, Covid) - Nasopharyngeal Swab     Status: None   Collection Time: 09/03/20  5:11 AM   Specimen: Nasopharyngeal Swab  Result Value Ref Range Status   SARS Coronavirus 2 by RT PCR NEGATIVE NEGATIVE Final    Comment: (NOTE) SARS-CoV-2 target nucleic acids are NOT DETECTED.  The SARS-CoV-2 RNA is  generally detectable in upper respiratoy specimens during the acute phase of infection. The lowest concentration of SARS-CoV-2 viral copies this assay can detect is 131 copies/mL. A negative result does not preclude SARS-Cov-2 infection and should not be used as the sole basis for treatment or other patient management decisions. A negative result may occur with  improper specimen collection/handling, submission of specimen other than nasopharyngeal swab, presence of viral mutation(s) within the areas targeted by this assay, and inadequate number of viral copies (<131 copies/mL). A negative result must be combined with clinical observations, patient history, and epidemiological information. The expected result is Negative.  Fact Sheet for Patients:  PinkCheek.be  Fact Sheet for Healthcare Providers:  GravelBags.it  This test is no t yet approved or cleared by the [place] FDA and  has been authorized for detection and/or diagnosis of SARS-CoV-2 by FDA under an Emergency Use Authorization (EUA). This EUA will remain  in effect (meaning this test can be used) for the duration of the COVID-19 declaration under Section 564(b)(1) of the Act, 21 U.S.C. section 360bbb-3(b)(1), unless the authorization is terminated or revoked sooner.     Influenza A by PCR NEGATIVE NEGATIVE Final   Influenza B by PCR NEGATIVE NEGATIVE Final    Comment: (NOTE) The Xpert Xpress SARS-CoV-2/FLU/RSV assay is intended as an aid in  the diagnosis of influenza from Nasopharyngeal swab specimens and  should not be used as a sole basis for treatment. Nasal washings and  aspirates are unacceptable for Xpert Xpress SARS-CoV-2/FLU/RSV  testing.  Fact Sheet for Patients: PinkCheek.be  Fact Sheet for Healthcare Providers: GravelBags.it  This test is not yet approved or cleared by the [place] FDA and  has been authorized for detection and/or diagnosis of SARS-CoV-2 by  FDA under an Emergency Use Authorization (EUA). This EUA will remain  in effect (meaning this test can be used) for the duration of the  Covid-19 declaration under Section 564(b)(1) of the Act, 21  U.S.C. section 360bbb-3(b)(1), unless the authorization is  terminated or revoked. Performed at [facility], [address] [place], [place] [address]   Gram stain     Status: None   Collection Time: 09/03/20  9:44 AM   Specimen: Ascitic; Body Fluid  Result Value Ref Range Status   Specimen Description ASCITIC  Final  Special Requests Immunocompromised  Final   Gram Stain   Final    NO ORGANISMS SEEN WBC PRESENT, PREDOMINANTLY MONONUCLEAR Performed at [facility], [address], [place], [place] [address]    Report Status 09/03/2020 FINAL  Final  Culture, body fluid-bottle     Status: None (Preliminary result)   Collection Time: 09/03/20  9:44 AM   Specimen: Ascitic  Result Value Ref Range Status   Specimen Description ASCITIC  Final   Special Requests 10CC  Final   Culture   Final    NO GROWTH 3 DAYS Performed at [facility], [address] [place], [place] [address]    Report Status PENDING  Incomplete         Radiology Studies: US Paracentesis  Result Date: 09/06/2020 INDICATION: Recurrent ascites, cirrhosis EXAM: ULTRASOUND GUIDED THERAPEUTIC PARACENTESIS MEDICATIONS: None COMPLICATIONS: None immediate PROCEDURE: Informed written consent was obtained from the patient after a discussion of the risks, benefits and alternatives to treatment. A timeout was performed prior to the initiation of the procedure. Initial ultrasound scanning demonstrates a small amount of ascites within the right lower abdominal quadrant. The right lower abdomen was prepped and draped in the usual sterile fashion. 1% lidocaine was used for local anesthesia. Following this, a 5 [place] Yueh catheter was introduced. An  ultrasound image was saved for documentation purposes. The paracentesis was performed. The catheter was removed and a dressing was applied. The patient tolerated the procedure well without immediate post procedural complication. Patient received post-procedure intravenous albumin; see nursing notes for details. FINDINGS: A total of approximately 800 mL of amber colored ascitic fluid was removed. Anticipated 1-2 L of fluid to be aspirated but repeat ultrasound demonstrated minimal residual fluid in the RIGHT lower quadrant at the conclusion of the procedure. IMPRESSION: Successful ultrasound-guided paracentesis yielding 800 mL of peritoneal fluid. Electronically Signed   By: Lavonia Dana M.D.   On: 09/06/2020 12:56        Scheduled Meds: . Chlorhexidine Gluconate Cloth  6 each Topical Daily  . doxycycline  100 mg Oral Q12H  . feeding supplement  237 mL Oral BID BM  . furosemide  40 mg Intravenous Daily  . lactulose  20 g Oral TID  . levothyroxine  75 mcg Oral QAC breakfast  . multivitamin with minerals  1 tablet Oral Daily  . potassium chloride  40 mEq Oral Daily  . potassium chloride  40 mEq Oral Once  . rifaximin  550 mg Oral BID  . spironolactone  150 mg Oral Daily   Continuous Infusions: . sodium chloride 10 mL/hr at 09/06/20 1007  . albumin human 50 g (09/06/20 1101)  . cefTRIAXone (ROCEPHIN)  IV 2 g (09/06/20 1013)     LOS: 3 days        Hosie Poisson, MD [facility]   To contact the attending provider between 7A-7P or the covering provider during after hours 7P-7A, please log into the web site [email or web address] and access using universal [place] password for that web site. If you do not have the password, please call the hospital operator.  09/06/2020, 2:24 PM

## 2020-09-06 NOTE — Procedures (Signed)
PreOperative Dx: Cirrhosis, ascites Postoperative Dx: Cirrhosis, ascites Procedure:   US guided paracentesis Radiologist:  Thornton Papas Anesthesia:  10 ml of1% lidocaine Specimen:  800 mL of amber ascitic fluid EBL:   < 1 ml Complications: None

## 2020-09-06 NOTE — Progress Notes (Signed)
Subjective: Still with abdominal pain, mostly right flank/side area. Somewhat improved after paracentesis. Tolerating diet, but not eating much (doesn't like the food). Has had some muscle cramps (likely related to hypokalemia). Denies N/V. Had a bowel movement this morning without hematochezia or melena. No other GI complaints.  Objective: Vital signs in last 24 hours: Temp:  [98.9 F (37.2 C)-99 F (37.2 C)] 98.9 F (37.2 C) (11/05 0437) Pulse Rate:  [80-84] 82 (11/05 1226) Resp:  [18-20] 18 (11/05 1226) BP: (93-104)/(53-66) 93/58 (11/05 1226) SpO2:  [94 %-100 %] 94 % (11/05 1226) Weight:  [53.5 kg] 53.5 kg (11/05 0403) Last BM Date: 09/05/20 General:   Alert and oriented, pleasant Head:  Normocephalic and atraumatic. Eyes:  No icterus, sclera clear. Conjuctiva pink.  Heart:  S1, S2 present, no murmurs noted.  Lungs: Clear to auscultation bilaterally, without wheezing, rales, or rhonchi.  Abdomen:  Bowel sounds present, soft, non-distended. Noted generalized TTP. No HSM or hernias noted. No rebound or guarding. Msk:  Symmetrical without gross deformities. Normal posture. Pulses:  Normal bilateral DP pulses noted. Extremities:  Without clubbing or edema. Neurologic:  Alert and  oriented x4;  grossly normal neurologically. Psych:  Alert and cooperative. Normal mood and affect.  Intake/Output from previous day: 11/04 0701 - 11/05 0700 In: 2443.7 [P.O.:1320; I.V.:115.2; IV Piggyback:258.5] Out: 2550 [Urine:2550] Intake/Output this shift: Total I/O In: 240 [P.O.:240] Out: -   Lab Results: Recent Labs    09/04/20 0540 09/05/20 0412 09/06/20 0559  WBC 5.2 4.8 4.0  HGB 7.0* 7.9* 7.5*  HCT 24.3* 26.7* 26.0*  PLT 36* 34* 38*   BMET Recent Labs    09/04/20 0540 09/05/20 0412 09/06/20 0559  NA 132* 130* 132*  K 3.4* 3.1* 3.1*  CL 101 96* 97*  CO2 24 25 27   GLUCOSE 77 73 79  BUN 7* 6* 6*  CREATININE 0.50 0.57 0.56  CALCIUM 8.2* 8.1* 8.5*   LFT Recent Labs     09/04/20 0540 09/05/20 0412 09/06/20 0559  PROT 5.4* 5.2* 5.5*  ALBUMIN 3.0* 2.8* 3.2*  AST 34 36 33  ALT 20 19 18   ALKPHOS 90 88 81  BILITOT 2.4* 2.1* 1.8*   PT/INR Recent Labs    09/05/20 0412 09/06/20 0559  LABPROT 18.6* 19.1*  INR 1.6* 1.7*   Hepatitis Panel No results for input(s): HEPBSAG, HCVAB, HEPAIGM, HEPBIGM in the last 72 hours.   Studies/Results: US Paracentesis  Result Date: 09/06/2020 INDICATION: Recurrent ascites, cirrhosis EXAM: ULTRASOUND GUIDED THERAPEUTIC PARACENTESIS MEDICATIONS: None COMPLICATIONS: None immediate PROCEDURE: Informed written consent was obtained from the patient after a discussion of the risks, benefits and alternatives to treatment. A timeout was performed prior to the initiation of the procedure. Initial ultrasound scanning demonstrates a small amount of ascites within the right lower abdominal quadrant. The right lower abdomen was prepped and draped in the usual sterile fashion. 1% lidocaine was used for local anesthesia. Following this, a 5 [place] Yueh catheter was introduced. An ultrasound image was saved for documentation purposes. The paracentesis was performed. The catheter was removed and a dressing was applied. The patient tolerated the procedure well without immediate post procedural complication. Patient received post-procedure intravenous albumin; see nursing notes for details. FINDINGS: A total of approximately 800 mL of amber colored ascitic fluid was removed. Anticipated 1-2 L of fluid to be aspirated but repeat ultrasound demonstrated minimal residual fluid in the RIGHT lower quadrant at the conclusion of the procedure. IMPRESSION: Successful ultrasound-guided paracentesis yielding 800 mL  of peritoneal fluid. Electronically Signed   By: Lavonia Dana M.D.   On: 09/06/2020 12:56    Assessment: 64 year old female with history of decompensated cirrhosis, followed by [facility], with significant thrombocytopenia, ascites requiring  abdominal paracentesis, hepatic encephalopathy, history of bleeding esophageal varices.  Admission last month with profound ascites and encephalopathy.  Presented to the emergency department November 2 with increasing abdominal distention, mildly worsened chronic abdominal pain, hematuria.  Meld sodium 15 at time of consultation. MELD Na today is 20.   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  She has been evaluated by [place], transplant hepatology with [place] at their [facility].  Last seen in March 2021.  At that time there were reservations regarding her candidacy for liver transplantation because of multiple comorbidities specifically with frequent falls, multiple fractures.  Patient did not complete neurology consultation as recommended.  She was supposed to return in September to see Mrs. Beverley Fiedler.  Of note, per records, patient was listed for liver transplantation while living in [place] in 2017 but due to relapse in alcohol abuse (admitted with DTs), she was taken off the list. TODAY: MELD improved to 19. Had paracentesis. Appears stable from cirrhosis standpoint.  Ascites: Underwent paracentesis November 2 with no evidence of SBP.  5.5 L removed.  Reported noncompliance of diuretics at home as patient was not sure whether she was supposed to start them in the setting of electrolyte abnormalities.  Home diuretics typically consist of Lasix 40 mg daily and spironolactone 150 mg daily.  Patient inquiring about additional paracentesis prior to discharge.  Yesterday it did not appear that she has had significant reaccumulation to warrant LVAP today. TODAY: This morning the hospitalist felt she had significant tense ascites and U/S para was scheduled. Result in 800 mL amber fluid. Will adjust diuretics as per below.  Hepatic encephalopathy: Chronically prescribed Xifaxan 550 mg twice daily she has been taking it once daily.  She takes lactulose every  other day due to limited ambulation.  No overt encephalopathy this admission.  Abdominal pain: Complains of chronic generalized abdominal pain for years, recently worsening with increased abdominal distention.  No evidence of SBP.  Abdominal film with possible ileus.  CT with no evidence of ileus.  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  This could be portal colopathy.  Colonoscopy in 2019 with redundant colon with significant looping, significant portal colopathy, internal hemorrhoids. TODAY: Still with abdominal pain though somewhat improved. Still quite tender on exam. No stool changes, query colopathy, ascites, and muscle cramps as multifactorial etiology. CT done on admission as outlined above.  Anemia: Hemoglobin steadily dropping but no overt GI bleeding.  Last EGD May 2019, she had very small esophageal varices as well as nonbleeding gastric ulcers. Colonoscopy May 2019 redundant colon, severe abdominal pain.  Entire colon with severe congestion/edema consistent with changes due to portal hypertension.  Virtual colonoscopy June 2020 with no significant colon polyps, mass, stricture. TODAY: hgb is somewhat stable at 7.5 (was 7.0 -> 7.9) No overt GI bleed this morning.   Plan: 1. 2 g sodium diet. 2. Due for EGD for variceal surveillance, can be done as an outpatient with her primary gastroenterologist. 3. Daily weights 4. Increase Aldactone  to 200 mg daily KEEP MAP >70 5. Increase Lasix to 40 mg bid KEEP MAP >70 6. Electrolyte replacement per hospitalist 7. Supportive measures   Thank you for allowing [place] to participate in the care of  Nat Math, DNP, AGNP-C Adult & Gerontological Nurse Practitioner [facility]     LOS: 3 days    09/06/2020, 1:41 PM

## 2020-09-06 NOTE — Care Management Important Message (Signed)
Important Message  Patient Details  Name: Megan Roberson MRN: [id number] Date of Birth: August 01, 1956   Medicare Important Message Given:  Yes     Tommy Medal 09/06/2020, 4:04 PM

## 2020-09-07 DIAGNOSIS — R188 Other ascites: Secondary | ICD-10-CM | POA: Diagnosis not present

## 2020-09-07 LAB — CBC
HCT: 24.6 % — ABNORMAL LOW (ref 36.0–46.0)
Hemoglobin: 7.3 g/dL — ABNORMAL LOW (ref 12.0–15.0)
MCH: 25.3 pg — ABNORMAL LOW (ref 26.0–34.0)
MCHC: 29.7 g/dL — ABNORMAL LOW (ref 30.0–36.0)
MCV: 85.4 fL (ref 80.0–100.0)
Platelets: 29 10*3/uL — CL (ref 150–400)
RBC: 2.88 MIL/uL — ABNORMAL LOW (ref 3.87–5.11)
RDW: 18.4 % — ABNORMAL HIGH (ref 11.5–15.5)
WBC: 3.6 10*3/uL — ABNORMAL LOW (ref 4.0–10.5)
nRBC: 0 % (ref 0.0–0.2)

## 2020-09-07 LAB — COMPREHENSIVE METABOLIC PANEL
ALT: 15 U/L (ref 0–44)
AST: 29 U/L (ref 15–41)
Albumin: 3.5 g/dL (ref 3.5–5.0)
Alkaline Phosphatase: 73 U/L (ref 38–126)
Anion gap: 10 (ref 5–15)
BUN: 7 mg/dL — ABNORMAL LOW (ref 8–23)
CO2: 28 mmol/L (ref 22–32)
Calcium: 8.8 mg/dL — ABNORMAL LOW (ref 8.9–10.3)
Chloride: 98 mmol/L (ref 98–111)
Creatinine, Ser: 0.53 mg/dL (ref 0.44–1.00)
GFR, Estimated: >60 mL/min (ref >60)
Glucose, Bld: 79 mg/dL (ref 70–99)
Potassium: 3.5 mmol/L (ref 3.5–5.1)
Sodium: 136 mmol/L (ref 135–145)
Total Bilirubin: 2 mg/dL — ABNORMAL HIGH (ref 0.3–1.2)
Total Protein: 5.6 g/dL — ABNORMAL LOW (ref 6.5–8.1)

## 2020-09-07 LAB — MAGNESIUM: Magnesium: 1.9 mg/dL (ref 1.7–2.4)

## 2020-09-07 LAB — PROTIME-INR
INR: 1.8 — ABNORMAL HIGH (ref 0.8–1.2)
Prothrombin Time: 20.5 seconds — ABNORMAL HIGH (ref 11.4–15.2)

## 2020-09-07 LAB — PHOSPHORUS: Phosphorus: 3.8 mg/dL (ref 2.5–4.6)

## 2020-09-07 NOTE — Progress Notes (Signed)
Vital signs in last 24 hours: Temp:  [98 F (36.7 C)-99.5 F (37.5 C)] 98 F (36.7 C) (11/06 0639) Pulse Rate:  [70-82] 70 (11/06 0639) Resp:  [16-19] 16 (11/06 0639) BP: (95-116)/(68-75) 95/68 (11/06 0639) SpO2:  [96 %-100 %] 96 % (11/06 0639) Weight:  [52.7 kg] 52.7 kg (11/06 0500) Last BM Date: 09/06/20 General:   Alert,  pleasant and cooperative in NAD Abdomen: Nondistended. Positive bowel sounds;  Soft, very mild diffuse tenderness to palpation; no fluid wave or shifting dullness. Extremities:  Without clubbing or edema.    Intake/Output from previous day: 11/05 0701 - 11/06 0700 In: 480 [P.O.:480] Out: 2250 [Urine:2250] Intake/Output this shift: No intake/output data recorded.  Lab Results: Recent Labs    09/05/20 0412 09/06/20 0559 09/07/20 0624  WBC 4.8 4.0 3.6*  HGB 7.9* 7.5* 7.3*  HCT 26.7* 26.0* 24.6*  PLT 34* 38* 29*   BMET Recent Labs    09/05/20 0412 09/06/20 0559 09/07/20 0624  NA 130* 132* 136  K 3.1* 3.1* 3.5  CL 96* 97* 98  CO2 25 27 28   GLUCOSE 73 79 79  BUN 6* 6* 7*  CREATININE 0.57 0.56 0.53  CALCIUM 8.1* 8.5* 8.8*   LFT Recent Labs    09/07/20 0624  PROT 5.6*  ALBUMIN 3.5  AST 29  ALT 15  ALKPHOS 73  BILITOT 2.0*   PT/INR Recent Labs    09/06/20 0559 09/07/20 0624  LABPROT 19.1* 20.5*  INR 1.7* 1.8*   Hepatitis Panel No results for input(s): HEPBSAG, HCVAB, HEPAIGM, HEPBIGM in the last 72 hours. C-Diff No results for input(s): CDIFFTOX in the last 72 hours.  Studies/Results: DG Ankle 2 Views Left  Result Date: 09/06/2020 CLINICAL DATA:  64 year old female left ankle pain and ulceration EXAM: LEFT ANKLE - 2 VIEW COMPARISON:  None FINDINGS: Osteopenia. No acute displaced fracture. Pes cavus partially imaged, with plantar flexion. Lucency within the soft tissues overlying the lateral malleolus compatible with the given history. No radiopaque foreign body. IMPRESSION: Negative for acute bony abnormality. Evidence  of skin defect overlying the lateral malleolus as is the given history. No evidence of osteomyelitis. Osteopenia. Electronically Signed   By: Corrie Mckusick D.O.   On: 09/06/2020 16:26   US Paracentesis  Result Date: 09/06/2020 INDICATION: Recurrent ascites, cirrhosis EXAM: ULTRASOUND GUIDED THERAPEUTIC PARACENTESIS MEDICATIONS: None COMPLICATIONS: None immediate PROCEDURE: Informed written consent was obtained from the patient after a discussion of the risks, benefits and alternatives to treatment. A timeout was performed prior to the initiation of the procedure. Initial ultrasound scanning demonstrates a small amount of ascites within the right lower abdominal quadrant. The right lower abdomen was prepped and draped in the usual sterile fashion. 1% lidocaine was used for local anesthesia. Following this, a 5 [place] Yueh catheter was introduced. An ultrasound image was saved for documentation purposes. The paracentesis was performed. The catheter was removed and a dressing was applied. The patient tolerated the procedure well without immediate post procedural complication. Patient received post-procedure intravenous albumin; see nursing notes for details. FINDINGS: A total of approximately 800 mL of amber colored ascitic fluid was removed. Anticipated 1-2 L of fluid to be aspirated but repeat ultrasound demonstrated minimal residual fluid in the RIGHT lower quadrant at the conclusion of the procedure. IMPRESSION: Successful ultrasound-guided paracentesis yielding 800 mL of peritoneal fluid. Electronically Signed   By: Lavonia Dana M.D.   On: 09/06/2020 12:56   Impression: 64 year old lady with decompensated EtOH  related cirrhosis-appears to be stabilizing. Tolerating her current diuretic regimen. Chronic abdominal pain at baseline. Hepatic encephalopathy improved.   Recommendations:  2 g sodium diet Continue Aldactone 200 mg daily, Lasix 40 mg twice daily Continue Xifaxan; Continue lactulose. Titrate  to three semiformed stools daily as practically feasible. Follow electrolytes closely.  Would favor short interval follow-up with [facility] (in the next week to 10 days)   Principal Problem:   Ascites Active Problems:   Cirrhosis of liver with ascites (HCC)   Abdominal pain   Thrombocytopenia (HCC)   Palliative care by specialist   Protein-calorie malnutrition, severe ([place])   Goals of care, counseling/discussion   Osteoporosis   Hypoalbuminemia   Generalized weakness   Hypothyroidism   UTI (urinary tract infection)   Mood disorder ([place])   Closed left ankle fracture   Hydrothorax Manus Rudd  09/07/2020, 12:53 PM

## 2020-09-07 NOTE — Progress Notes (Signed)
PROGRESS NOTE    Megan Roberson  LKG:[id number] DOB: Oct 03, 1956 DOA: 09/02/2020 PCP: Celene Squibb, MD    Chief Complaint  Patient presents with  . Abdominal Pain    Brief Narrative:  64 year old lady prior history of [place] cirrhosis, esophageal varices, hepatic encephalopathy, generalized anxiety disorder, peripheral neuropathy, thrombocytopenia, hypothyroidism, depression, ascites, follows up with GI as an outpatient presents to ED for worsening abdominal distention.  On arrival to ED she was found to have significant ascites.  CT of the abdomen pelvis which shows gaseous distention of the colon without any acute findings.  She underwent ultrasound paracentesis and 5.5 L of ascitic fluid removed. Fluid analysis and cultures are negative for SBP.  GI on board to assist with liver cirrhosis and ascites.   OF note pt was evaluated by [facility] transplant hepatology with [place] at [facility], needed to complete neurology consultation and return to the clinic but she never made th appt.   She underwent  Another ultrasound paracentesis and 800 mL of clear amber-colored fluid removed with minimal improvement in her abdominal distention and discomfort.  Patient reports pain in left ankle.  X-rays of the left ankle ordered, negative for fracture.   Pt seen and examined today. She reports her ankle pain has improved. She had some back spasms which improved with robaxin and potassium supplementaiton.     Assessment & Plan:   Principal Problem:   Ascites Active Problems:   Cirrhosis of liver with ascites (HCC)   Abdominal pain   Thrombocytopenia (HCC)   Palliative care by specialist   Protein-calorie malnutrition, severe ([place])   Goals of care, counseling/discussion   Osteoporosis   Hypoalbuminemia   Generalized weakness   Hypothyroidism   UTI (urinary tract infection)   Mood disorder (HCC)   Closed left ankle fracture   Hydrothorax   Liver cirrhosis with ascites S/p  ultrasound paracentesis with removal of 5.5 L of clear liquid fluid.  She received 50 g of IV albumin post paracentesis.  Ascites fluid sent for analysis and culture,negative for SBP.  Repeat paracentesis on 09/06/20,  only 800 mL of fluid removed.  IV Lasix increased to twice daily and spironolactone increased to 200 mg daily for better diuresis. 5.4 lit diuresed since admission.  Potassium supplementation added to her regimen. Continue with his rifaximin. Gastroenterology on board to assist with management. Will need close follow up with GI on discharge.    Acute hepatic encephalopathy Probably secondary to noncompliance to lactulose. Improving she is alert and oriented to place and person Continue with lactulose 3 times daily with goal of 3-4 bowel movements per day. Continue with rifaximin BID. No changes in meds.      Thickening of her ascending colon probably from congestion from liver cirrhosis: Would explain her abdominal pain. Probably secondary to liver cirrhosis. Outpatient follow-up with a colonoscopy    Esophageal varices Patient will need outpatient follow-up for an EGD for surveillance of varices.   Community-acquired pneumonia/  She is currently on room air and reports occasional cough Complete the course of antibiotics for 5 days. Repeat CXR inam to evaluate for resolution or improvement in the pneumonia.    Hypothyroidism Continue with Synthroid.   Severe protein calorie malnutrition Dietary consult  Hypokalemia:  Replaced.    Muscle spasms probably secondary to hypokalemia:  Potassium supplementation to continue .   Thrombocytopenia Probably secondary to liver cirrhosis. Platelets continue to drop.  No evidence of bleeding.  Patient reports left ankle pain from  a fall more than 3 weeks ago. She reports having x-rays done and is wearing the splint.  Since yesterday patient reports her ankle pain has worsened.  X-rays of the ankle ordered , is  negative for fracture.     In view of her multiple medical problems palliative care consulted for goals of care.  On further discussions outpatient palliative/hospice referral made   DVT prophylaxis: SCDs  code Status: Full code Family Communication: None at bedside Disposition:   Status is: Inpatient  Remains inpatient appropriate because:Ongoing diagnostic testing needed not appropriate for outpatient work up, IV treatments appropriate due to intensity of illness or inability to take PO and Inpatient level of care appropriate due to severity of illness   Dispo: The patient is from: Home              Anticipated d/c is to: Home              Anticipated d/c date is: 2 days              Patient currently is not medically stable to d/c.       Consultants:   Gastroenterology  Palliative care  Procedures: Ultrasound paracentesis Antimicrobials: IV Rocephin  Subjective: Nausea, has improved,   Objective: Vitals:   09/06/20 2010 09/06/20 2116 09/07/20 0500 09/07/20 0639  BP:  116/75  95/68  Pulse:  82  70  Resp:  18  16  Temp:  99.5 F (37.5 C)  98 F (36.7 C)  TempSrc:  Oral  Oral  SpO2: 97% 100%  96%  Weight:   52.7 kg   Height:        Intake/Output Summary (Last 24 hours) at 09/07/2020 1400 Last data filed at 09/06/2020 2100 Gross per 24 hour  Intake --  Output 2250 ml  Net -2250 ml   Filed Weights   09/05/20 0457 09/06/20 0403 09/07/20 0500  Weight: 55.9 kg 53.5 kg 52.7 kg    Examination:  General exam: chornically ill appearing lady, in mild discomfort from abd discomfort and distention.  Respiratory system: no wheezing heard, no   Cardiovascular system: S1 & S2 heard, RRR, no JVD, no pedal edema.  Gastrointestinal system: Abdomen is soft, midly distended, bowel sounds wnl.  Central nervous system: Alert and oriented, non focal. Extremities: left ankle tenderness. No pedal edema.  Skin: no rashes seen.  Psychiatry: Mo od is appropriate.      Data Reviewed: I have personally reviewed following labs and imaging studies  CBC: Recent Labs  Lab 09/03/20 0015 09/04/20 0540 09/05/20 0412 09/06/20 0559 09/07/20 0624  WBC 8.9 5.2 4.8 4.0 3.6*  NEUTROABS 6.6  --   --   --   --   HGB 8.1* 7.0* 7.9* 7.5* 7.3*  HCT 28.3* 24.3* 26.7* 26.0* 24.6*  MCV 87.9 87.7 85.3 85.5 85.4  PLT 47* 36* 34* 38* 29*    Basic Metabolic Panel: Recent Labs  Lab 09/03/20 0015 09/04/20 0540 09/05/20 0412 09/06/20 0559 09/07/20 0624  NA 136 132* 130* 132* 136  K 3.7 3.4* 3.1* 3.1* 3.5  CL 109 101 96* 97* 98  CO2 22 24 25 27 28   GLUCOSE 74 77 73 79 79  BUN 9 7* 6* 6* 7*  CREATININE 0.55 0.50 0.57 0.56 0.53  CALCIUM 7.9* 8.2* 8.1* 8.5* 8.8*  MG  --  1.6* 1.9 1.8 1.9  PHOS  --   --   --   --  3.8    GFR:  Estimated Creatinine Clearance: 61.4 mL/min (by C-G formula based on SCr of 0.53 mg/dL).  Liver Function Tests: Recent Labs  Lab 09/03/20 0015 09/04/20 0540 09/05/20 0412 09/06/20 0559 09/07/20 0624  AST 42* 34 36 33 29  ALT 27 20 19 18 15   ALKPHOS 115 90 88 81 73  BILITOT 2.5* 2.4* 2.1* 1.8* 2.0*  PROT 6.0* 5.4* 5.2* 5.5* 5.6*  ALBUMIN 2.6* 3.0* 2.8* 3.2* 3.5    CBG: No results for input(s): GLUCAP in the last 168 hours.   Recent Results (from the past 240 hour(s))  Urine culture     Status: None   Collection Time: 09/03/20 12:23 AM   Specimen: Urine, Catheterized  Result Value Ref Range Status   Specimen Description   Final    URINE, CATHETERIZED Performed at [facility], [address] [place], [place] [address]    Special Requests   Final    NONE Performed at [facility], [address], [place], [place] [address]    Culture   Final    NO GROWTH Performed at [facility], [place] [address], [place], [place] [address]    Report Status 09/04/2020 FINAL  Final  Respiratory Panel by RT PCR (Flu A&B, Covid) - Nasopharyngeal Swab     Status: None   Collection Time: 09/03/20  5:11 AM   Specimen:  Nasopharyngeal Swab  Result Value Ref Range Status   SARS Coronavirus 2 by RT PCR NEGATIVE NEGATIVE Final    Comment: (NOTE) SARS-CoV-2 target nucleic acids are NOT DETECTED.  The SARS-CoV-2 RNA is generally detectable in upper respiratoy specimens during the acute phase of infection. The lowest concentration of SARS-CoV-2 viral copies this assay can detect is 131 copies/mL. A negative result does not preclude SARS-Cov-2 infection and should not be used as the sole basis for treatment or other patient management decisions. A negative result may occur with  improper specimen collection/handling, submission of specimen other than nasopharyngeal swab, presence of viral mutation(s) within the areas targeted by this assay, and inadequate number of viral copies (<131 copies/mL). A negative result must be combined with clinical observations, patient history, and epidemiological information. The expected result is Negative.  Fact Sheet for Patients:  PinkCheek.be  Fact Sheet for Healthcare Providers:  GravelBags.it  This test is no t yet approved or cleared by the [place] FDA and  has been authorized for detection and/or diagnosis of SARS-CoV-2 by FDA under an Emergency Use Authorization (EUA). This EUA will remain  in effect (meaning this test can be used) for the duration of the COVID-19 declaration under Section 564(b)(1) of the Act, 21 U.S.C. section 360bbb-3(b)(1), unless the authorization is terminated or revoked sooner.     Influenza A by PCR NEGATIVE NEGATIVE Final   Influenza B by PCR NEGATIVE NEGATIVE Final    Comment: (NOTE) The Xpert Xpress SARS-CoV-2/FLU/RSV assay is intended as an aid in  the diagnosis of influenza from Nasopharyngeal swab specimens and  should not be used as a sole basis for treatment. Nasal washings and  aspirates are unacceptable for Xpert Xpress SARS-CoV-2/FLU/RSV  testing.  Fact Sheet  for Patients: PinkCheek.be  Fact Sheet for Healthcare Providers: GravelBags.it  This test is not yet approved or cleared by the [place] FDA and  has been authorized for detection and/or diagnosis of SARS-CoV-2 by  FDA under an Emergency Use Authorization (EUA). This EUA will remain  in effect (meaning this test can be used) for the duration of the  Covid-19  declaration under Section 564(b)(1) of the Act, 21  U.S.C. section 360bbb-3(b)(1), unless the authorization is  terminated or revoked. Performed at [facility], [address] [place], [place] [address]   Gram stain     Status: None   Collection Time: 09/03/20  9:44 AM   Specimen: Ascitic; Body Fluid  Result Value Ref Range Status   Specimen Description ASCITIC  Final   Special Requests Immunocompromised  Final   Gram Stain   Final    NO ORGANISMS SEEN WBC PRESENT, PREDOMINANTLY MONONUCLEAR Performed at [facility], [address], [place], [place] [address]    Report Status 09/03/2020 FINAL  Final  Culture, body fluid-bottle     Status: None (Preliminary result)   Collection Time: 09/03/20  9:44 AM   Specimen: Ascitic  Result Value Ref Range Status   Specimen Description ASCITIC  Final   Special Requests 10CC  Final   Culture   Final    NO GROWTH 3 DAYS Performed at [facility], [address], [place], [place] [address]    Report Status PENDING  Incomplete         Radiology Studies: DG Ankle 2 Views Left  Result Date: 09/06/2020 CLINICAL DATA:  65 year old female left ankle pain and ulceration EXAM: LEFT ANKLE - 2 VIEW COMPARISON:  None FINDINGS: Osteopenia. No acute displaced fracture. Pes cavus partially imaged, with plantar flexion. Lucency within the soft tissues overlying the lateral malleolus compatible with the given history. No radiopaque foreign body. IMPRESSION: Negative for acute bony abnormality. Evidence of skin defect overlying the  lateral malleolus as is the given history. No evidence of osteomyelitis. Osteopenia. Electronically Signed   By: Corrie Mckusick D.O.   On: 09/06/2020 16:26   US Paracentesis  Result Date: 09/06/2020 INDICATION: Recurrent ascites, cirrhosis EXAM: ULTRASOUND GUIDED THERAPEUTIC PARACENTESIS MEDICATIONS: None COMPLICATIONS: None immediate PROCEDURE: Informed written consent was obtained from the patient after a discussion of the risks, benefits and alternatives to treatment. A timeout was performed prior to the initiation of the procedure. Initial ultrasound scanning demonstrates a small amount of ascites within the right lower abdominal quadrant. The right lower abdomen was prepped and draped in the usual sterile fashion. 1% lidocaine was used for local anesthesia. Following this, a 5 [place] Yueh catheter was introduced. An ultrasound image was saved for documentation purposes. The paracentesis was performed. The catheter was removed and a dressing was applied. The patient tolerated the procedure well without immediate post procedural complication. Patient received post-procedure intravenous albumin; see nursing notes for details. FINDINGS: A total of approximately 800 mL of amber colored ascitic fluid was removed. Anticipated 1-2 L of fluid to be aspirated but repeat ultrasound demonstrated minimal residual fluid in the RIGHT lower quadrant at the conclusion of the procedure. IMPRESSION: Successful ultrasound-guided paracentesis yielding 800 mL of peritoneal fluid. Electronically Signed   By: Lavonia Dana M.D.   On: 09/06/2020 12:56        Scheduled Meds: . Chlorhexidine Gluconate Cloth  6 each Topical Daily  . doxycycline  100 mg Oral Q12H  . feeding supplement  237 mL Oral BID BM  . furosemide  40 mg Oral BID  . lactulose  20 g Oral TID  . levothyroxine  75 mcg Oral QAC breakfast  . multivitamin with minerals  1 tablet Oral Daily  . potassium chloride  40 mEq Oral Daily  . potassium chloride  40  mEq Oral Once  . rifaximin  550 mg Oral BID  . spironolactone  200 mg Oral Daily   Continuous Infusions: . sodium chloride 10 mL/hr at 09/06/20 1007  . cefTRIAXone (ROCEPHIN)  IV 2 g (09/07/20 0922)  . methocarbamol (ROBAXIN) IV       LOS: 4 days        Hosie Poisson, MD [facility]   To contact the attending provider between 7A-7P or the covering provider during after hours 7P-7A, please log into the web site [email or web address] and access using universal [place] password for that web site. If you do not have the password, please call the hospital operator.  09/07/2020, 2:00 PM

## 2020-09-08 ENCOUNTER — Inpatient Hospital Stay (HOSPITAL_COMMUNITY): Payer: Medicare HMO

## 2020-09-08 LAB — COMPREHENSIVE METABOLIC PANEL
ALT: 14 U/L (ref 0–44)
AST: 31 U/L (ref 15–41)
Albumin: 4.2 g/dL (ref 3.5–5.0)
Alkaline Phosphatase: 71 U/L (ref 38–126)
Anion gap: 10 (ref 5–15)
BUN: 8 mg/dL (ref 8–23)
CO2: 26 mmol/L (ref 22–32)
Calcium: 9.1 mg/dL (ref 8.9–10.3)
Chloride: 96 mmol/L — ABNORMAL LOW (ref 98–111)
Creatinine, Ser: 0.59 mg/dL (ref 0.44–1.00)
GFR, Estimated: >60 mL/min (ref >60)
Glucose, Bld: 99 mg/dL (ref 70–99)
Potassium: 3.2 mmol/L — ABNORMAL LOW (ref 3.5–5.1)
Sodium: 132 mmol/L — ABNORMAL LOW (ref 135–145)
Total Bilirubin: 2.1 mg/dL — ABNORMAL HIGH (ref 0.3–1.2)
Total Protein: 6.3 g/dL — ABNORMAL LOW (ref 6.5–8.1)

## 2020-09-08 LAB — MAGNESIUM: Magnesium: 1.8 mg/dL (ref 1.7–2.4)

## 2020-09-08 LAB — CULTURE, BODY FLUID W GRAM STAIN -BOTTLE: Culture: NO GROWTH

## 2020-09-08 LAB — BPAM RBC
Blood Product Expiration Date: 202112102359
Blood Product Expiration Date: 202112102359
ISSUE DATE / TIME: 202111031534
Unit Type and Rh: 5100
Unit Type and Rh: 5100

## 2020-09-08 LAB — TYPE AND SCREEN
ABO/RH(D): O POS
Antibody Screen: NEGATIVE
Unit division: 0
Unit division: 0

## 2020-09-08 LAB — CBC
HCT: 24.7 % — ABNORMAL LOW (ref 36.0–46.0)
Hemoglobin: 7.5 g/dL — ABNORMAL LOW (ref 12.0–15.0)
MCH: 25.6 pg — ABNORMAL LOW (ref 26.0–34.0)
MCHC: 30.4 g/dL (ref 30.0–36.0)
MCV: 84.3 fL (ref 80.0–100.0)
Platelets: 33 10*3/uL — ABNORMAL LOW (ref 150–400)
RBC: 2.93 MIL/uL — ABNORMAL LOW (ref 3.87–5.11)
RDW: 19 % — ABNORMAL HIGH (ref 11.5–15.5)
WBC: 3.9 10*3/uL — ABNORMAL LOW (ref 4.0–10.5)
nRBC: 0 % (ref 0.0–0.2)

## 2020-09-08 LAB — PROTIME-INR
INR: 1.8 — ABNORMAL HIGH (ref 0.8–1.2)
Prothrombin Time: 20 seconds — ABNORMAL HIGH (ref 11.4–15.2)

## 2020-09-08 MED ORDER — RIFAXIMIN 550 MG PO TABS
550.0000 mg | ORAL_TABLET | Freq: Two times a day (BID) | ORAL | 1 refills | Status: DC
Start: 2020-09-08 — End: 2020-12-09

## 2020-09-08 MED ORDER — ENSURE ENLIVE PO LIQD
237.0000 mL | Freq: Two times a day (BID) | ORAL | 12 refills | Status: DC
Start: 2020-09-08 — End: 2021-01-08

## 2020-09-08 MED ORDER — FUROSEMIDE 40 MG PO TABS
40.0000 mg | ORAL_TABLET | Freq: Two times a day (BID) | ORAL | 1 refills | Status: DC
Start: 2020-09-08 — End: 2020-12-09

## 2020-09-08 MED ORDER — ADULT MULTIVITAMIN W/MINERALS CH
1.0000 | ORAL_TABLET | Freq: Every day | ORAL | Status: DC
Start: 2020-09-09 — End: 2021-01-08

## 2020-09-08 MED ORDER — OXYCODONE HCL 5 MG PO TABS: 5.0000 mg | ORAL_TABLET | Freq: Two times a day (BID) | ORAL | 0 refills | Status: DC | PRN

## 2020-09-08 MED ORDER — SPIRONOLACTONE 100 MG PO TABS
200.0000 mg | ORAL_TABLET | Freq: Every day | ORAL | 2 refills | Status: DC
Start: 2020-09-09 — End: 2021-01-08

## 2020-09-08 MED ORDER — LACTULOSE 10 GM/15ML PO SOLN
20.0000 g | Freq: Three times a day (TID) | ORAL | 3 refills | Status: DC
Start: 2020-09-08 — End: 2021-01-08

## 2020-09-08 MED ORDER — POTASSIUM CHLORIDE CRYS ER 20 MEQ PO TBCR
40.0000 meq | EXTENDED_RELEASE_TABLET | Freq: Every day | ORAL | 2 refills | Status: DC
Start: 2020-09-08 — End: 2021-01-08

## 2020-09-08 MED ORDER — POTASSIUM CHLORIDE CRYS ER 20 MEQ PO TBCR
40.0000 meq | EXTENDED_RELEASE_TABLET | Freq: Once | ORAL | Status: AC
  Administered 2020-09-08: 40 meq via ORAL
  Filled 2020-09-08: qty 2

## 2020-09-08 NOTE — Progress Notes (Signed)
Nsg Discharge Note  Admit Date:  09/02/2020 Discharge date: 09/08/2020   Megan Roberson to be D/C'd Home per MD order.  AVS completed.  Copy for chart, and copy for patient signed, and dated. Patient/caregiver able to verbalize understanding.  Discharge Medication: Allergies as of 09/08/2020      Reactions   Penicillins Swelling, Other (See Comments)   FACIAL SWELLING Did it involve swelling of the face/tongue/throat, SOB, or low BP? Yes Did it involve sudden or severe rash/hives, skin peeling, or any reaction on the inside of your mouth or nose? No Did you need to seek medical attention at a hospital or doctor's office? Unknown When did it last happen?30 years If all above answers are "NO", may proceed with cephalosporin use.   Tylenol [acetaminophen] Nausea Only   Sulfa Antibiotics Itching, Rash   Tramadol Swelling, Rash      Medication List    TAKE these medications   diclofenac Sodium 1 % Gel Commonly known as: VOLTAREN Apply 2 g topically daily as needed (for pain).   feeding supplement Liqd Take 237 mLs by mouth 2 (two) times daily between meals.   furosemide 40 MG tablet Commonly known as: LASIX Take 1 tablet (40 mg total) by mouth 2 (two) times daily. What changed: when to take this   lactulose 10 GM/15ML solution Commonly known as: CHRONULAC Take 30 mLs (20 g total) by mouth 3 (three) times daily. What changed: when to take this   levothyroxine 75 MCG tablet Commonly known as: SYNTHROID Take 1 tablet (75 mcg total) by mouth daily before breakfast.   multivitamin with minerals Tabs tablet Take 1 tablet by mouth daily. Start taking on: September 09, 2020   omeprazole 20 MG capsule Commonly known as: PRILOSEC Take 1 capsule (20 mg total) by mouth daily.   ondansetron 4 MG disintegrating tablet Commonly known as: Zofran ODT Take 1 tablet (4 mg total) by mouth every 8 (eight) hours as needed for nausea or vomiting.   oxyCODONE 5 MG immediate release  tablet Commonly known as: Oxy IR/ROXICODONE Take 1 tablet (5 mg total) by mouth every 12 (twelve) hours as needed for up to 3 days for severe pain.   potassium chloride SA 20 MEQ tablet Commonly known as: KLOR-CON Take 2 tablets (40 mEq total) by mouth daily.   rifaximin 550 MG Tabs tablet Commonly known as: XIFAXAN Take 1 tablet (550 mg total) by mouth 2 (two) times daily. Pt states she take once daily   sertraline 100 MG tablet Commonly known as: ZOLOFT Take 100 mg by mouth daily.   sertraline 50 MG tablet Commonly known as: ZOLOFT Take 1 tablet (50 mg total) by mouth daily.   spironolactone 100 MG tablet Commonly known as: ALDACTONE Take 2 tablets (200 mg total) by mouth daily. Start taking on: September 09, 2020 What changed:   medication strength  how much to take   traZODone 50 MG tablet Commonly known as: DESYREL Take 1 tablet (50 mg total) by mouth at bedtime as needed for sleep.       Discharge Assessment: Vitals:   09/07/20 1441 09/08/20 0600  BP: 114/67 105/71  Pulse: 89 74  Resp:  16  Temp:  99.4 F (37.4 C)  SpO2: 97% 100%   Skin clean, dry and intact without evidence of skin break down, no evidence of skin tears noted. IV catheter discontinued intact. Site without signs and symptoms of complications - no redness or edema noted at insertion site, patient denies  c/o pain - only slight tenderness at site.  Dressing with slight pressure applied.  D/c Instructions-Education: Discharge instructions given to patient/family with verbalized understanding. D/c education completed with patient/family including follow up instructions, medication list, d/c activities limitations if indicated, with other d/c instructions as indicated by MD - patient able to verbalize understanding, all questions fully answered. Patient instructed to return to ED, call 911, or call MD for any changes in condition.  Patient escorted via [place], and D/C home via private auto.  Loa Socks, RN 09/08/2020 2:14 PM

## 2020-09-08 NOTE — Discharge Summary (Signed)
Physician Discharge Summary  Megan Roberson OTL:[id number] DOB: June 13, 1956 DOA: 09/02/2020  PCP: Megan Squibb, MD  Admit date: 09/02/2020 Discharge date: 09/08/2020  Admitted From: HOME.  Disposition:  HoME.   Recommendations for Outpatient Follow-up:  1. Follow up with PCP in 1-2 weeks 2. Please obtain BMP/CBC in one week 3. Please follow up with GI in one week.   Home Health: yes.   Discharge Condition: stable.  CODE STATUS:FULL CODE.  Diet recommendation: Heart Healthy   Brief/Interim Summary: 64 year old lady prior history of Megan Roberson cirrhosis, esophageal varices, hepatic encephalopathy, generalized anxiety disorder, peripheral neuropathy, thrombocytopenia, hypothyroidism, depression, ascites, follows up with GI as an outpatient presents to ED for worsening abdominal distention.  On arrival to ED she was found to have significant ascites.  CT of the abdomen pelvis which shows gaseous distention of the colon without any acute findings.  She underwent ultrasound paracentesis and 5.5 L of ascitic fluid removed. Fluid analysis and cultures are negative for SBP.  GI on board to assist with liver cirrhosis and ascites.   OF note pt was evaluated by [facility] transplant hepatology with [place] at [facility] clinic, needed to complete neurology consultation and return to the clinic but she never made th appt.   She underwent  Another ultrasound paracentesis and 800 mL of clear amber-colored fluid removed with minimal improvement in her abdominal distention and discomfort.  Patient reports pain in left ankle.  X-rays of the left ankle ordered, negative for fracture.    She is appropriately diuresed and discharged.   Discharge Diagnoses:  Principal Problem:   Ascites Active Problems:   Cirrhosis of liver with ascites (HCC)   Abdominal pain   Thrombocytopenia (HCC)   Palliative care by specialist   Protein-calorie malnutrition, severe ([place])   Goals of care, counseling/discussion    Osteoporosis   Hypoalbuminemia   Generalized weakness   Hypothyroidism   UTI (urinary tract infection)   Mood disorder (HCC)   Closed left ankle fracture   Hydrothorax  Liver cirrhosis with ascites S/p ultrasound paracentesis with removal of 5.5 L of clear liquid fluid.  She received 50 g of IV albumin post paracentesis.  Ascites fluid sent for analysis and culture,negative for SBP.  Repeat paracentesis on 09/06/20,  only 800 mL of fluid removed.  IV Lasix increased to twice daily, and transitioned to oral lasix on discharge and spironolactone increased to 200 mg daily for better diuresis. 10  lit diuresed since admission.  Potassium supplementation added to her regimen. Continue with his rifaximin. Gastroenterology on board to assist with management. Will need close follow up with GI on discharge.    Acute hepatic encephalopathy Probably secondary to noncompliance to lactulose. Improving she is alert and oriented to place and person Continue with lactulose 3 times daily with goal of 3-4 bowel movements per day. Continue with rifaximin BID. No changes in meds.      Thickening of her ascending colon probably from congestion from liver cirrhosis: Would explain her abdominal pain. Probably secondary to liver cirrhosis. Outpatient follow-up with a colonoscopy    Esophageal varices Patient will need outpatient follow-up for an EGD for surveillance of varices.   Community-acquired pneumonia/  She is currently on room air and reports occasional cough Complete the course of antibiotics for 5 days. Repeat CXR inam to evaluate for resolution or improvement in the pneumonia.    Hypothyroidism Continue with Synthroid.   Severe protein calorie malnutrition Dietary consult  Hypokalemia:  Replaced.    Muscle  spasms probably secondary to hypokalemia:  Potassium supplementation to continue .   Thrombocytopenia Probably secondary to liver cirrhosis.   No  evidence of bleeding.  Patient reports left ankle pain from a fall more than 3 weeks ago.   X-rays of the ankle ordered , is negative for fracture.     In view of her multiple medical problems palliative care consulted for goals of care.  On further discussions outpatient palliative/hospice referral made  Discharge Instructions  Discharge Instructions    Diet - low sodium heart healthy   Complete by: As directed    Discharge instructions   Complete by: As directed    Please follow up with GI in one week.   Increase activity slowly   Complete by: As directed      Allergies as of 09/08/2020      Reactions   Penicillins Swelling, Other (See Comments)   FACIAL SWELLING Did it involve swelling of the face/tongue/throat, SOB, or low BP? Yes Did it involve sudden or severe rash/hives, skin peeling, or any reaction on the inside of your mouth or nose? No Did you need to seek medical attention at a hospital or doctor's office? Unknown When did it last happen?30 years If all above answers are "NO", may proceed with cephalosporin use.   Tylenol [acetaminophen] Nausea Only   Sulfa Antibiotics Itching, Rash   Tramadol Swelling, Rash      Medication List    TAKE these medications   diclofenac Sodium 1 % Gel Commonly known as: VOLTAREN Apply 2 g topically daily as needed (for pain).   feeding supplement Liqd Take 237 mLs by mouth 2 (two) times daily between meals.   furosemide 40 MG tablet Commonly known as: LASIX Take 1 tablet (40 mg total) by mouth 2 (two) times daily. What changed: when to take this   lactulose 10 GM/15ML solution Commonly known as: CHRONULAC Take 30 mLs (20 g total) by mouth 3 (three) times daily. What changed: when to take this   levothyroxine 75 MCG tablet Commonly known as: SYNTHROID Take 1 tablet (75 mcg total) by mouth daily before breakfast.   multivitamin with minerals Tabs tablet Take 1 tablet by mouth daily. Start taking on:  September 09, 2020   omeprazole 20 MG capsule Commonly known as: PRILOSEC Take 1 capsule (20 mg total) by mouth daily.   ondansetron 4 MG disintegrating tablet Commonly known as: Zofran ODT Take 1 tablet (4 mg total) by mouth every 8 (eight) hours as needed for nausea or vomiting.   oxyCODONE 5 MG immediate release tablet Commonly known as: Oxy IR/ROXICODONE Take 1 tablet (5 mg total) by mouth every 12 (twelve) hours as needed for up to 3 days for severe pain.   potassium chloride SA 20 MEQ tablet Commonly known as: KLOR-CON Take 2 tablets (40 mEq total) by mouth daily.   rifaximin 550 MG Tabs tablet Commonly known as: XIFAXAN Take 1 tablet (550 mg total) by mouth 2 (two) times daily. Pt states she take once daily   sertraline 100 MG tablet Commonly known as: ZOLOFT Take 100 mg by mouth daily.   sertraline 50 MG tablet Commonly known as: ZOLOFT Take 1 tablet (50 mg total) by mouth daily.   spironolactone 100 MG tablet Commonly known as: ALDACTONE Take 2 tablets (200 mg total) by mouth daily. Start taking on: September 09, 2020 What changed:   medication strength  how much to take   traZODone 50 MG tablet Commonly known as: DESYREL  Take 1 tablet (50 mg total) by mouth at bedtime as needed for sleep.       Follow-up Information    [facility] Follow up.   Specialty: Home Health Services Why: HHPT Contact information: [place] [address] [phone]        [facility] Follow up.   Contact information: [place]       Megan Squibb, MD. Schedule an appointment as soon as possible for a visit in 1 week(s).   Specialty: Internal Medicine Contact information: [place] [address] [phone]              Allergies  Allergen Reactions  . Penicillins Swelling and Other (See Comments)    FACIAL SWELLING Did it involve swelling of the  face/tongue/throat, SOB, or low BP? Yes Did it involve sudden or severe rash/hives, skin peeling, or any reaction on the inside of your mouth or nose? No Did you need to seek medical attention at a hospital or doctor's office? Unknown When did it last happen?30 years If all above answers are "NO", may proceed with cephalosporin use.   . Tylenol [Acetaminophen] Nausea Only  . Sulfa Antibiotics Itching and Rash  . Tramadol Swelling and Rash    Consultations:  GI   Procedures/Studies: DG Chest 2 View  Result Date: 09/08/2020 CLINICAL DATA:  Follow-up right pneumonia EXAM: CHEST - 2 VIEW COMPARISON:  09/03/2020 chest radiograph. FINDINGS: Stable cardiomediastinal silhouette with normal heart size. No pneumothorax. Small stable right pleural effusion. No left pleural effusion. Resolved right mid lung hazy opacity. Residual mild patchy right costophrenic angle opacity is similar. IMPRESSION: 1. Stable small right pleural effusion. 2. Overall improved right lung aeration with residual mild right costophrenic angle opacity. Electronically Signed   By: Ilona Sorrel M.D.   On: 09/08/2020 14:49   DG Ankle 2 Views Left  Result Date: 09/06/2020 CLINICAL DATA:  64 year old female left ankle pain and ulceration EXAM: LEFT ANKLE - 2 VIEW COMPARISON:  None FINDINGS: Osteopenia. No acute displaced fracture. Pes cavus partially imaged, with plantar flexion. Lucency within the soft tissues overlying the lateral malleolus compatible with the given history. No radiopaque foreign body. IMPRESSION: Negative for acute bony abnormality. Evidence of skin defect overlying the lateral malleolus as is the given history. No evidence of osteomyelitis. Osteopenia. Electronically Signed   By: Corrie Mckusick D.O.   On: 09/06/2020 16:26   CT Head Wo Contrast  Result Date: 08/13/2020 CLINICAL DATA:  Fall, head injury EXAM: CT HEAD WITHOUT CONTRAST TECHNIQUE: Contiguous axial images were obtained from the base of the  skull through the vertex without intravenous contrast. COMPARISON:  None. FINDINGS: Brain: Normal anatomic configuration. Parenchymal volume loss is commensurate with the patient's age. No abnormal intra or extra-axial mass lesion or fluid collection. No abnormal mass effect or midline shift. No evidence of acute intracranial hemorrhage or infarct. Ventricular size is normal. Cerebellum unremarkable. Vascular: No asymmetric hyperdense vasculature at the skull base. Skull: Intact Sinuses/Orbits: Paranasal sinuses are clear. Orbits are unremarkable. Other: Mastoid air cells and middle ear cavities are clear. IMPRESSION: No acute intracranial injury.  No calvarial fracture. Electronically Signed   By: Fidela Salisbury MD   On: 08/13/2020 22:06   CT CHEST W CONTRAST  Result Date: 08/16/2020 CLINICAL DATA:  Possible pneumonia on abdomen and pelvis CT scan yesterday which was performed for abdominal pain and distension. EXAM: CT CHEST  WITH CONTRAST TECHNIQUE: Multidetector CT imaging of the chest was performed during intravenous contrast administration. CONTRAST:  75 mL OMNIPAQUE IOHEXOL 300 MG/ML  SOLN COMPARISON:  CT abdomen and pelvis 08/15/2020. CT chest, abdomen and pelvis 07/07/2018. FINDINGS: Cardiovascular: No significant vascular findings. Normal heart size. No pericardial effusion. Mediastinum/Nodes: No enlarged mediastinal, hilar, or axillary lymph nodes. Thyroid gland, trachea, and esophagus demonstrate no significant findings. Lungs/Pleura: Small bilateral pleural effusions are noted. Small locule of air in the patient's left effusion seen on the patient's CT scan yesterday is no longer present. There is airspace disease in the right lower lobe as seen on yesterday's CT with an appearance most consistent with pneumonia. Upper Abdomen: Cirrhosis, small volume of ascites and changes of portal venous hypertension of ascites again seen. See report of CT scan yesterday. Musculoskeletal: No acute abnormality.  Mild, remote superior endplate compression fracture of T8 is seen. The patient also has a remote compression fracture of L1 which is partially imaged. IMPRESSION: Right lower lobe airspace disease has an appearance most consistent with pneumonia and is unchanged since yesterday's exam. Very small bilateral pleural effusions. No air is seen in the left effusion on today's study. Cirrhosis, small volume of abdominal ascites and portal venous hypertension as seen on CT scan yesterday. Electronically Signed   By: Inge Rise M.D.   On: 08/16/2020 15:13   CT ABDOMEN PELVIS W CONTRAST  Result Date: 09/03/2020 CLINICAL DATA:  Abdominal distension, hematuria EXAM: CT ABDOMEN AND PELVIS WITH CONTRAST TECHNIQUE: Multidetector CT imaging of the abdomen and pelvis was performed using the standard protocol following bolus administration of intravenous contrast. CONTRAST:  36m OMNIPAQUE IOHEXOL 300 MG/ML  SOLN COMPARISON:  08/15/2020 FINDINGS: Lower chest: Ground-glass pulmonary infiltrate within the right lower lobe has improved slightly in the interval since prior examination, likely infectious in etiology. Moderate left pleural effusion is present, increased in size since prior exam. Compressive atelectasis of the left lower lobe noted. Cardiac size within normal limits. Gastroesophageal varices are noted. Hepatobiliary: Cirrhosis. No enhancing intrahepatic mass is clearly identified. There is dilation of the right superior portal vein likely related to an underlying intrahepatic portal venous shunt. Cholelithiasis again noted without obvious pericholecystic inflammatory change, though evaluation is slightly limited by the presence of ascites. No intra or extrahepatic biliary ductal dilation. Pancreas: Unremarkable Spleen: The spleen is enlarged, unchanged from prior examination, measuring 15.6 cm in greatest dimension, in keeping with changes of portal venous hypertension. No intrasplenic lesions are seen. The  splenic vein is patent. Adrenals/Urinary Tract: The adrenal glands are unremarkable. 13 mm x 14 mm nonobstructing calculus is seen within the right renal pelvis. The kidneys are otherwise unremarkable. The bladder is decompressed with a Foley catheter balloon seen within its lumen. Stomach/Bowel: The stomach and small bowel are unremarkable. Previously noted circumferential wall thickening involving the descending and proximal sigmoid colon has resolved suggesting that the findings on prior examination were related to and a infectious or inflammatory process at that time. There has developed mild surf rental thickening, similar to the prior examination, now involving the ascending colon. No evidence of obstruction or perforation. No free intraperitoneal gas. Large volume ascites has progressed since prior examination. Vascular/Lymphatic: Recanalization of the umbilical vein is present in keeping with portosystemic collateralization. Similarly, several and rectal varices are noted within the left lateral wall of the rectum. The abdominal aorta is of normal caliber. No pathologic adenopathy within the abdomen and pelvis. Reproductive: Simple appearing cysts are seen within the adnexa bilaterally, stable  since prior examination. There size does not warrant further follow-up. Uterus unremarkable. Other: Bilateral breast implants are partially visualized. Musculoskeletal: Left hip ORIF has been performed. Healed left iliac crest fracture. Compression fractures T12 and superior endplate fracture of L1 are unchanged from prior examination no acute bone abnormality. IMPRESSION: Slight interval improvement in probable infectious pulmonary infiltrate within the right lower lobe. Cirrhosis. 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 Gastroesophageal and perirectal portosystemic collateral varices are identified. Resolved  circumferential wall thickening involving the distal colon noted on prior examination, likely infectious or inflammatory process at that time. New similar appearing mural thickening involving the ascending colon, likely infectious or inflammatory. Given its restricted territory, portal colopathy is considered less likely. Electronically Signed   By: Fidela Salisbury MD   On: 09/03/2020 02:48   CT ABDOMEN PELVIS W CONTRAST  Result Date: 08/15/2020 CLINICAL DATA:  Abdominal distension, pain. Cirrhosis. Paracentesis performed on 08/14/2020 EXAM: CT ABDOMEN AND PELVIS WITH CONTRAST TECHNIQUE: Multidetector CT imaging of the abdomen and pelvis was performed using the standard protocol following bolus administration of intravenous contrast. CONTRAST:  24m OMNIPAQUE IOHEXOL 300 MG/ML  SOLN COMPARISON:  07/07/2020 FINDINGS: Lower chest: Patchy predominantly ground-glass opacity within the right lower lobe is new from prior. Small left pleural effusion with a small amount of air present within the non dependent portion of the collection (series 5, image 11). Heart size within normal limits. No pericardial effusion. Hepatobiliary: Cirrhotic hepatic morphology. No focal liver lesion is identified. There is recanalization of the umbilical vein. Portal vein is well opacified. Multiple gallstones present within the gallbladder. No biliary dilatation. Pancreas: Unremarkable. Spleen: Splenomegaly.  No focal splenic lesion. Adrenals/Urinary Tract: Unremarkable adrenal glands. 12 x 7 mm stone remains present within the right renal pelvis. Mild right-sided hydronephrosis. No left hydronephrosis. Kidneys enhance symmetrically. Urinary bladder is unremarkable. Stomach/Bowel: There is long segment mild circumferential wall thickening predominantly involving the sigmoid and descending colon. No definite pericolonic inflammatory changes, although this evaluation is significantly limited given the presence of abdominopelvic ascites.  Similar degree of wall thickening involving the cecum and ascending colon. No dilated loops of small bowel. Stomach within normal limits. Vascular/Lymphatic: Findings of portal hypertension. No acute vascular abnormality is identified. No abdominopelvic lymphadenopathy. Reproductive: Unremarkable uterus. Stable appearance of bilateral adnexal cysts, previously characterized as benign on MRI from January 2021. Other: Small to moderate volume ascites. No organized abdominopelvic collection or abscess. No pneumoperitoneum. Musculoskeletal: Posttraumatic and postsurgical changes to the left hip and left hemipelvis. Chronic thoracolumbar compression fractures as well as sacral fracture. No new or acute osseous findings. IMPRESSION: 1. New ground-glass opacity within the right lower lobe suspicious for pneumonia. 2. Small left pleural effusion now containing a small amount of air. Etiology is unclear in the absence of recent thoracentesis. Consider CT chest for further evaluation. 3. Interval development of long segment mild wall thickening within the sigmoid and descending colon. No definite pericolonic inflammatory changes, although this evaluation is significantly limited given the presence of abdominopelvic ascites. Findings may represent an infectious or inflammatory colitis versus changes related to portal colopathy. 4. Mild right-sided hydronephrosis. 12 x 7 mm stone remains present within the right renal pelvis. 5. Cirrhosis with evidence of portal hypertension including splenomegaly and small to moderate volume ascites. 6. Cholelithiasis. Electronically Signed   By: NDavina PokeD.O.   On: 08/15/2020 17:44   [place]acentesis  Result Date: 09/06/2020 INDICATION: Recurrent ascites, cirrhosis EXAM:  ULTRASOUND GUIDED THERAPEUTIC PARACENTESIS MEDICATIONS: None COMPLICATIONS: None immediate PROCEDURE: Informed written consent was obtained from the patient after a discussion of the risks, benefits and  alternatives to treatment. A timeout was performed prior to the initiation of the procedure. Initial ultrasound scanning demonstrates a small amount of ascites within the right lower abdominal quadrant. The right lower abdomen was prepped and draped in the usual sterile fashion. 1% lidocaine was used for local anesthesia. Following this, a 5 [place] Yueh catheter was introduced. An ultrasound image was saved for documentation purposes. The paracentesis was performed. The catheter was removed and a dressing was applied. The patient tolerated the procedure well without immediate post procedural complication. Patient received post-procedure intravenous albumin; see nursing notes for details. FINDINGS: A total of approximately 800 mL of amber colored ascitic fluid was removed. Anticipated 1-2 L of fluid to be aspirated but repeat ultrasound demonstrated minimal residual fluid in the RIGHT lower quadrant at the conclusion of the procedure. IMPRESSION: Successful ultrasound-guided paracentesis yielding 800 mL of peritoneal fluid. Electronically Signed   By: Lavonia Dana M.D.   On: 09/06/2020 12:56   US Paracentesis  Result Date: 09/03/2020 INDICATION: Cirrhosis, ascites EXAM: ULTRASOUND GUIDED DIAGNOSTIC AND THERAPEUTIC PARACENTESIS MEDICATIONS: None COMPLICATIONS: None immediate PROCEDURE: Informed written consent was obtained from the patient after a discussion of the risks, benefits and alternatives to treatment. A timeout was performed prior to the initiation of the procedure. Initial ultrasound scanning demonstrates a large amount of ascites within the LEFT lower abdominal quadrant. The right lower abdomen was prepped and draped in the usual sterile fashion. 1% lidocaine was used for local anesthesia. Following this, a 5 [place] Yueh catheter was introduced. An ultrasound image was saved for documentation purposes. The paracentesis was performed. The catheter was removed and a dressing was applied. The patient  tolerated the procedure well without immediate post procedural complication. Patient received post-procedure intravenous albumin; see nursing notes for details. FINDINGS: A total of approximately 5.5 L of clear light yellow ascitic fluid was removed. Samples were sent to the laboratory as requested by the clinical team. IMPRESSION: Successful ultrasound-guided paracentesis yielding 5.5 liters of peritoneal fluid. Electronically Signed   By: Lavonia Dana M.D.   On: 09/03/2020 10:39   US Paracentesis  Result Date: 08/14/2020 INDICATION: Ascites secondary to hepatic cirrhosis. Request for diagnostic and therapeutic paracentesis. EXAM: ULTRASOUND GUIDED PARACENTESIS MEDICATIONS: 1% lidocaine 10 mL COMPLICATIONS: None immediate. PROCEDURE: Informed written consent was obtained from the patient after a discussion of the risks, benefits and alternatives to treatment. A timeout was performed prior to the initiation of the procedure. Initial ultrasound scanning demonstrates a moderate amount of ascites within the left lateral abdomen quadrant. The left lateral abdomen was prepped and draped in the usual sterile fashion. 1% lidocaine was used for local anesthesia. Following this, a 19 gauge, 7-cm, Yueh catheter was introduced. An ultrasound image was saved for documentation purposes. The paracentesis was performed. The catheter was removed and a dressing was applied. The patient tolerated the procedure well without immediate post procedural complication. FINDINGS: A total of approximately 2.2 L of clear yellow fluid was removed. Samples were sent to the laboratory as requested by the clinical team. IMPRESSION: Successful ultrasound-guided paracentesis yielding 2.2 liters of peritoneal fluid. Read by: Gareth Eagle, PA-C Electronically Signed   By: Marijo Conception M.D.   On: 08/14/2020 12:53   DG CHEST PORT 1 VIEW  Result Date: 09/03/2020 CLINICAL DATA:  Abdominal pain and distension.  Previous pneumonia. EXAM:  PORTABLE  CHEST 1 VIEW COMPARISON:  CT of the chest 08/16/2020 FINDINGS: Right-sided airspace disease is similar the prior exam. Fluid in the minor fissure and right CP angle is again seen. Minimal fluid and atelectasis is present at the left base. Heart size is normal. Lung volumes are low. IMPRESSION: 1. Stable right-sided airspace disease and effusion. Findings are concerning for pneumonia. 2. Minimal fluid and atelectasis at the left base. Electronically Signed   By: San Morelle M.D.   On: 09/03/2020 12:01   DG Abdomen Acute W/Chest  Result Date: 09/03/2020 CLINICAL DATA:  Abdominal pain, shortness of breath EXAM: DG ABDOMEN ACUTE WITH 1 VIEW CHEST COMPARISON:  08/15/2020 FINDINGS: Previously seen 12 mm right renal pelvic stone again noted. There is gaseous distention of the colon, likely mild ileus. No small bowel distention or free air. No organomegaly. Heart is normal size. Scarring at the right lung base. Left lung clear. IMPRESSION: Mild gaseous distention of the colon, likely ileus. Right renal pelvic stone again noted. Right basilar scarring. Electronically Signed   By: Rolm Baptise M.D.   On: 09/03/2020 00:49   DG Abd Portable 1V  Result Date: 08/15/2020 CLINICAL DATA:  Abdominal pain and distension EXAM: PORTABLE ABDOMEN - 1 VIEW COMPARISON:  CT abdomen and pelvis July 07, 2020 FINDINGS: There is mild dilatation of loops of colon. There is mild wall thickening in the descending colon which may have inflammatory etiology. There is moderate stool in the colon. There is no appreciable air-fluid levels. There are gallstones in the right upper quadrant. There is a calcification in the region of the right renal pelvis measuring 1.2 x 0.8 cm. There is postoperative change in the proximal femur. No evident free air. Visualized lung bases are. IMPRESSION: Question a degree of colitis. No bowel obstruction appreciable. No free air. Gallstones evident in right upper quadrant. Visualized lung bases  clear. Calcification in the region of the right renal pelvis measuring 1.2 x 0.8 cm. Obstruction at or near the right ureteropelvic junction from this apparent calculus cannot be excluded by radiography. Electronically Signed   By: Lowella Grip III M.D.   On: 08/15/2020 12:50   DG Foot Complete Left  Result Date: 08/13/2020 CLINICAL DATA:  Fall, left foot bruising EXAM: LEFT FOOT - COMPLETE 3+ VIEW COMPARISON:  None. 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 There is an acute intra-articular fracture of the probable medial navicular noted dorsally with fracture fragments in near anatomic alignment. Soft tissues are unremarkable. Joint spaces appear preserved. IMPRESSION: Tiny minimally displaced fracture of the probable medial cuneiform dorsally with fracture fragments in anatomic alignment. Electronically Signed   By: Fidela Salisbury MD   On: 08/13/2020 22:31       Subjective: No new complaints.   Discharge Exam: Vitals:   09/07/20 1441 09/08/20 0600  BP: 114/67 105/71  Pulse: 89 74  Resp:  16  Temp:  99.4 F (37.4 C)  SpO2: 97% 100%   Vitals:   09/07/20 0639 09/07/20 1441 09/08/20 0423 09/08/20 0600  BP: 95/68 114/67  105/71  Pulse: 70 89  74  Resp: 16   16  Temp: 98 F (36.7 C)   99.4 F (37.4 C)  TempSrc: Oral   Oral  SpO2: 96% 97%  100%  Weight:   50.9 kg   Height:        General: Pt is alert, awake, not in acute  distress Cardiovascular: RRR, S1/S2 +, no rubs, no gallops Respiratory: CTA bilaterally, no wheezing, no rhonchi Abdominal: Soft, distended, bowel sounds heard.  Extremities: no edema, no cyanosis    The results of significant diagnostics from this hospitalization (including imaging, microbiology, ancillary and laboratory) are listed below for reference.     Microbiology: Recent Results (from the past 240 hour(s))  Urine culture     Status: None   Collection  Time: 09/03/20 12:23 AM   Specimen: Urine, Catheterized  Result Value Ref Range Status   Specimen Description   Final    URINE, CATHETERIZED Performed at [facility], [address] [place], [place] [address]    Special Requests   Final    NONE Performed at [facility], [address], [place], [place] [address]    Culture   Final    NO GROWTH Performed at [facility], [place] [address], [place], [place] [address]    Report Status 09/04/2020 FINAL  Final  Respiratory Panel by RT PCR (Flu A&B, Covid) - Nasopharyngeal Swab     Status: None   Collection Time: 09/03/20  5:11 AM   Specimen: Nasopharyngeal Swab  Result Value Ref Range Status   SARS Coronavirus 2 by RT PCR NEGATIVE NEGATIVE Final    Comment: (NOTE) SARS-CoV-2 target nucleic acids are NOT DETECTED.  The SARS-CoV-2 RNA is generally detectable in upper respiratoy specimens during the acute phase of infection. The lowest concentration of SARS-CoV-2 viral copies this assay can detect is 131 copies/mL. A negative result does not preclude SARS-Cov-2 infection and should not be used as the sole basis for treatment or other patient management decisions. A negative result may occur with  improper specimen collection/handling, submission of specimen other than nasopharyngeal swab, presence of viral mutation(s) within the areas targeted by this assay, and inadequate number of viral copies (<131 copies/mL). A negative result must be combined with clinical observations, patient history, and epidemiological information. The expected result is Negative.  Fact Sheet for Patients:  PinkCheek.be  Fact Sheet for Healthcare Providers:  GravelBags.it  This test is no t yet approved or cleared by the [place] FDA and  has been authorized for detection and/or diagnosis of SARS-CoV-2 by FDA under an Emergency Use Authorization (EUA). This EUA will remain  in effect  (meaning this test can be used) for the duration of the COVID-19 declaration under Section 564(b)(1) of the Act, 21 U.S.C. section 360bbb-3(b)(1), unless the authorization is terminated or revoked sooner.     Influenza A by PCR NEGATIVE NEGATIVE Final   Influenza B by PCR NEGATIVE NEGATIVE Final    Comment: (NOTE) The Xpert Xpress SARS-CoV-2/FLU/RSV assay is intended as an aid in  the diagnosis of influenza from Nasopharyngeal swab specimens and  should not be used as a sole basis for treatment. Nasal washings and  aspirates are unacceptable for Xpert Xpress SARS-CoV-2/FLU/RSV  testing.  Fact Sheet for Patients: PinkCheek.be  Fact Sheet for Healthcare Providers: GravelBags.it  This test is not yet approved or cleared by the [place] FDA and  has been authorized for detection and/or diagnosis of SARS-CoV-2 by  FDA under an Emergency Use Authorization (EUA). This EUA will remain  in effect (meaning this test can be used) for the duration of the  Covid-19 declaration under Section 564(b)(1) of the Act, 21  U.S.C. section 360bbb-3(b)(1), unless the authorization is  terminated or revoked. Performed at [facility], [address] [place], [place] [address]   Gram stain  Status: None   Collection Time: 09/03/20  9:44 AM   Specimen: Ascitic; Body Fluid  Result Value Ref Range Status   Specimen Description ASCITIC  Final   Special Requests Immunocompromised  Final   Gram Stain   Final    NO ORGANISMS SEEN WBC PRESENT, PREDOMINANTLY MONONUCLEAR Performed at [facility], [address], [place], [place] [address]    Report Status 09/03/2020 FINAL  Final  Culture, body fluid-bottle     Status: None   Collection Time: 09/03/20  9:44 AM   Specimen: Ascitic  Result Value Ref Range Status   Specimen Description ASCITIC  Final   Special Requests 10CC  Final   Culture   Final    NO GROWTH 5 DAYS Performed at [facility], [address], [place], [place] [address]    Report Status 09/08/2020 FINAL  Final     Labs: BNP (last 3 results) Recent Labs    05/18/20 2209  BNP 774.1*   Basic Metabolic Panel: Recent Labs  Lab 09/04/20 0540 09/05/20 0412 09/06/20 0559 09/07/20 0624 09/08/20 0609  NA 132* 130* 132* 136 132*  K 3.4* 3.1* 3.1* 3.5 3.2*  CL 101 96* 97* 98 96*  CO2 24 25 27 28 26   GLUCOSE 77 73 79 79 99  BUN 7* 6* 6* 7* 8  CREATININE 0.50 0.57 0.56 0.53 0.59  CALCIUM 8.2* 8.1* 8.5* 8.8* 9.1  MG 1.6* 1.9 1.8 1.9 1.8  PHOS  --   --   --  3.8  --    Liver Function Tests: Recent Labs  Lab 09/04/20 0540 09/05/20 0412 09/06/20 0559 09/07/20 0624 09/08/20 0609  AST 34 36 33 29 31  ALT 20 19 18 15 14   ALKPHOS 90 88 81 73 71  BILITOT 2.4* 2.1* 1.8* 2.0* 2.1*  PROT 5.4* 5.2* 5.5* 5.6* 6.3*  ALBUMIN 3.0* 2.8* 3.2* 3.5 4.2   Recent Labs  Lab 09/03/20 0015  LIPASE 28   Recent Labs  Lab 09/03/20 0813 09/04/20 0540  AMMONIA 43* 33   CBC: Recent Labs  Lab 09/03/20 0015 09/03/20 0015 09/04/20 0540 09/05/20 0412 09/06/20 0559 09/07/20 0624 09/08/20 0609  WBC 8.9   < > 5.2 4.8 4.0 3.6* 3.9*  NEUTROABS 6.6  --   --   --   --   --   --   HGB 8.1*   < > 7.0* 7.9* 7.5* 7.3* 7.5*  HCT 28.3*   < > 24.3* 26.7* 26.0* 24.6* 24.7*  MCV 87.9   < > 87.7 85.3 85.5 85.4 84.3  PLT 47*   < > 36* 34* 38* 29* 33*   < > = values in this interval not displayed.   Cardiac Enzymes: No results for input(s): CKTOTAL, CKMB, CKMBINDEX, TROPONINI in the last 168 hours. BNP: Invalid input(s): POCBNP CBG: No results for input(s): GLUCAP in the last 168 hours. D-Dimer No results for input(s): DDIMER in the last 72 hours. Hgb A1c No results for input(s): HGBA1C in the last 72 hours. Lipid Profile No results for input(s): CHOL, HDL, LDLCALC, TRIG, CHOLHDL, LDLDIRECT in the last 72 hours. Thyroid function studies No results for input(s): TSH, T4TOTAL, T3FREE, THYROIDAB in the last 72  hours.  Invalid input(s): FREET3 Anemia work up No results for input(s): VITAMINB12, FOLATE, FERRITIN, TIBC, IRON, RETICCTPCT in the last 72 hours. Urinalysis    Component Value Date/Time   COLORURINE AMBER (A) 09/03/2020 0023   APPEARANCEUR CLOUDY (A) 09/03/2020 0023   LABSPEC 1.028  09/03/2020 0023   PHURINE 5.0 09/03/2020 0023   GLUCOSEU NEGATIVE 09/03/2020 0023   HGBUR LARGE (A) 09/03/2020 0023   BILIRUBINUR NEGATIVE 09/03/2020 0023   KETONESUR NEGATIVE 09/03/2020 0023   PROTEINUR 100 (A) 09/03/2020 0023   NITRITE NEGATIVE 09/03/2020 0023   LEUKOCYTESUR NEGATIVE 09/03/2020 0023   Sepsis Labs Invalid input(s): PROCALCITONIN,  WBC,  LACTICIDVEN Microbiology Recent Results (from the past 240 hour(s))  Urine culture     Status: None   Collection Time: 09/03/20 12:23 AM   Specimen: Urine, Catheterized  Result Value Ref Range Status   Specimen Description   Final    URINE, CATHETERIZED Performed at [facility], [address], [place], [place] [address]    Special Requests   Final    NONE Performed at [facility], [address] [place], [place] [address]    Culture   Final    NO GROWTH Performed at [facility], [place] [address] [place],  [address]    Report Status 09/04/2020 FINAL  Final  Respiratory Panel by RT PCR (Flu A&B, Covid) - Nasopharyngeal Swab     Status: None   Collection Time: 09/03/20  5:11 AM   Specimen: Nasopharyngeal Swab  Result Value Ref Range Status   SARS Coronavirus 2 by RT PCR NEGATIVE NEGATIVE Final    Comment: (NOTE) SARS-CoV-2 target nucleic acids are NOT DETECTED.  The SARS-CoV-2 RNA is generally detectable in upper respiratoy specimens during the acute phase of infection. The lowest concentration of SARS-CoV-2 viral copies this assay can detect is 131 copies/mL. A negative result does not preclude SARS-Cov-2 infection and should not be used as the sole basis for treatment or other patient management decisions. A negative  result may occur with  improper specimen collection/handling, submission of specimen other than nasopharyngeal swab, presence of viral mutation(s) within the areas targeted by this assay, and inadequate number of viral copies (<131 copies/mL). A negative result must be combined with clinical observations, patient history, and epidemiological information. The expected result is Negative.  Fact Sheet for Patients:  PinkCheek.be  Fact Sheet for Healthcare Providers:  GravelBags.it  This test is no t yet approved or cleared by the [place] FDA and  has been authorized for detection and/or diagnosis of SARS-CoV-2 by FDA under an Emergency Use Authorization (EUA). This EUA will remain  in effect (meaning this test can be used) for the duration of the COVID-19 declaration under Section 564(b)(1) of the Act, 21 U.S.C. section 360bbb-3(b)(1), unless the authorization is terminated or revoked sooner.     Influenza A by PCR NEGATIVE NEGATIVE Final   Influenza B by PCR NEGATIVE NEGATIVE Final    Comment: (NOTE) The Xpert Xpress SARS-CoV-2/FLU/RSV assay is intended as an aid in  the diagnosis of influenza from Nasopharyngeal swab specimens and  should not be used as a sole basis for treatment. Nasal washings and  aspirates are unacceptable for Xpert Xpress SARS-CoV-2/FLU/RSV  testing.  Fact Sheet for Patients: PinkCheek.be  Fact Sheet for Healthcare Providers: GravelBags.it  This test is not yet approved or cleared by the [place] FDA and  has been authorized for detection and/or diagnosis of SARS-CoV-2 by  FDA under an Emergency Use Authorization (EUA). This EUA will remain  in effect (meaning this test can be used) for the duration of the  Covid-19 declaration under Section 564(b)(1) of the Act, 21  U.S.C. section 360bbb-3(b)(1), unless the authorization is   terminated or revoked. Performed at [facility], [phone]  [address], [place], [place] [address]   Gram stain     Status: None   Collection Time: 09/03/20  9:44 AM   Specimen: Ascitic; Body Fluid  Result Value Ref Range Status   Specimen Description ASCITIC  Final   Special Requests Immunocompromised  Final   Gram Stain   Final    NO ORGANISMS SEEN WBC PRESENT, PREDOMINANTLY MONONUCLEAR Performed at [facility], [address], [place], [place] [address]    Report Status 09/03/2020 FINAL  Final  Culture, body fluid-bottle     Status: None   Collection Time: 09/03/20  9:44 AM   Specimen: Ascitic  Result Value Ref Range Status   Specimen Description ASCITIC  Final   Special Requests 10CC  Final   Culture   Final    NO GROWTH 5 DAYS Performed at [facility], [address] [place], [place] [address]    Report Status 09/08/2020 FINAL  Final     Time coordinating discharge:36 minutes.   SIGNED:   Hosie Poisson, MD  [facility] 09/08/2020, 5:10 PM

## 2020-09-09 ENCOUNTER — Ambulatory Visit (INDEPENDENT_AMBULATORY_CARE_PROVIDER_SITE_OTHER): Payer: Medicare HMO | Admitting: Family Medicine

## 2020-09-09 ENCOUNTER — Other Ambulatory Visit: Payer: Self-pay

## 2020-09-09 ENCOUNTER — Telehealth: Payer: Self-pay

## 2020-09-09 ENCOUNTER — Encounter: Payer: Self-pay | Admitting: Family Medicine

## 2020-09-09 VITALS — BP 118/76 | HR 90 | Temp 98.4°F | Wt 111.0 lb

## 2020-09-09 DIAGNOSIS — M8949 Other hypertrophic osteoarthropathy, multiple sites: Secondary | ICD-10-CM | POA: Diagnosis not present

## 2020-09-09 DIAGNOSIS — G8929 Other chronic pain: Secondary | ICD-10-CM

## 2020-09-09 DIAGNOSIS — M159 Polyosteoarthritis, unspecified: Secondary | ICD-10-CM

## 2020-09-09 DIAGNOSIS — R109 Unspecified abdominal pain: Secondary | ICD-10-CM

## 2020-09-09 DIAGNOSIS — R5381 Other malaise: Secondary | ICD-10-CM

## 2020-09-09 DIAGNOSIS — R110 Nausea: Secondary | ICD-10-CM

## 2020-09-09 DIAGNOSIS — G894 Chronic pain syndrome: Secondary | ICD-10-CM

## 2020-09-09 DIAGNOSIS — S82892S Other fracture of left lower leg, sequela: Secondary | ICD-10-CM | POA: Diagnosis not present

## 2020-09-09 DIAGNOSIS — K7460 Unspecified cirrhosis of liver: Secondary | ICD-10-CM

## 2020-09-09 DIAGNOSIS — L97322 Non-pressure chronic ulcer of left ankle with fat layer exposed: Secondary | ICD-10-CM | POA: Diagnosis not present

## 2020-09-09 DIAGNOSIS — R188 Other ascites: Secondary | ICD-10-CM

## 2020-09-09 DIAGNOSIS — E039 Hypothyroidism, unspecified: Secondary | ICD-10-CM

## 2020-09-09 DIAGNOSIS — R2681 Unsteadiness on feet: Secondary | ICD-10-CM

## 2020-09-09 MED ORDER — OXYCODONE HCL 10 MG PO TABS: ORAL_TABLET | ORAL | 0 refills | Status: DC

## 2020-09-09 NOTE — Telephone Encounter (Signed)
Pt request handicap placard. Form placed on Britt's desk. Pt to pick up at next OV appt (09/17/20).

## 2020-09-09 NOTE — Progress Notes (Signed)
Office Note 09/09/2020  CC:  Chief Complaint  Patient presents with  . Establish Care   HPI:  Megan Roberson is a 64 y.o. White female who is here to establish care and f/u hospitalization for decompensated cirrhosis with ascites. Patient's most recent primary MD: Dr. Wende Neighbors in [place], [place]. Old records in EPIC/HL EMR were reviewed prior to or during today's visit.   Chronic pain: managed by pain mgmt MD with [facility] in [place]. Pt and husband not happy, state that provider is too cautious due to fear of her liver dz, says she is in miserable pain all the time as a result. She's seeking additional help with management of her pain.  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   She was d/c'd from hosp yesterday after getting admission/tx for this. I reviewed hosp records today.  She did get IR paracentesis x 2 (5.5 L on 11/2 and 800 ml on 11/5), got albumin IV.  She also got blood transfusion, had no overt GI bleeding.   Home on lasix 92m bid and aldactone 2070mqd.  Also lactulose 30 ml tid and xifaxan 550 mg bid. She says she has f/u with Dr. ArHavery Morosoon.   Not on liver transplant list, although she [place] was [place].  Left ankle pain, states hx of fracture but I can't find any imaging to confirm this in EMR, says was dx'd and treated at an ED visit but can't be very specific.  Ankle x-rays in EMR from 2019 and most recently 09/06/20 show no fracture. Currently c/o pain in L ankle plus says she has had an ulceration on lateral aspect of ankle since having a cast on it recently---now cast has been taken off.   Past Medical History:  Diagnosis Date  . Anxiety and depression   . Cholelithiasis without cholecystitis   . Chronic abdominal pain   . Chronic nausea    + abd pain  . Cirrhosis of liver  with ascites (HCC)    Alc cirrhosis; hx of multiple abd paracentesis, +esoph varices (+banding), pancytopenia, hx of hepatic enceph, hx of hydrothorax  . Heart murmur    [facility], basal septal hypertophy-normal variant, LVEF 65-70%, normal LV/RV function, est RAP 5 mmHg, no sign valvular stenosis or regurg--trace MR/TR/PR  . History of abnormal cervical Pap smear   . History of pneumonia   . Hypothyroidism   . Insomnia   . Nephrolithiasis    right renal pelvis, 13-1425monobst stone  . Neuropathy    unclear hx  . Osteoarthritis, multiple sites    primarily hands and knees  . Osteoporosis 02/09/2018  . Ovarian cyst    bilat.  Stable on MRI pelvis 11/2019 and CT abd/pelv 09/2020--no f/u imaging indicated.  . Pancytopenia ([facility] . Splenomegaly   . [place]   severe, hx of plts down into 30s    Past Surgical History:  Procedure Laterality Date  . AUGMENTATION MAMMAPLASTY Bilateral    20 years ago  . BIOPSY  03/15/2018   gastric.  Procedure: BIOPSY;  Surgeon: ArmYetta FlockD;  [facility];  Service: Gastroenterology;;  Gastric  . COLONOSCOPY WITH ESOPHAGOGASTRODUODENOSCOPY (EGD) AND ESOPHAGEAL DILATION (ED)    . COLONOSCOPY WITH PROPOFOL N/A 03/15/2018   Redundant colon, severe looping, very edematous  colon c/w portal HTN.  Procedure: COLONOSCOPY WITH PROPOFOL;  Surgeon: Yetta Flock, MD;  [facility];  Service: Gastroenterology;  Laterality: N/A;  . ESOPHAGOGASTRODUODENOSCOPY (EGD) WITH PROPOFOL N/A 03/15/2018   Gastritis, small gastric ulcer. Procedure: ESOPHAGOGASTRODUODENOSCOPY (EGD) WITH PROPOFOL;  Surgeon: Yetta Flock, MD;  [facility];  Service: Gastroenterology;  Laterality: N/A;  . ESOPHAGOGASTRODUODENOSCOPY W/ BANDING     Varices  . FRACTURE SURGERY Left    x 2 L elbow and hip left   . HARDWARE REMOVAL Left 09/17/2017   Procedure: HARDWARE REMOVAL LEFT OLECRANON;   Surgeon: Altamese Monroe, MD;  Location: [place];  Service: Orthopedics;  Laterality: Left;  . IR PARACENTESIS  11/18/2017  . IR PARACENTESIS  11/26/2017  . IR PARACENTESIS  03/14/2018  . IR PARACENTESIS  08/08/2020  . KNEE SURGERY Right    open incision for ligaments  . ORIF ELBOW FRACTURE Left 04/30/2017   Procedure: REVISION OPEN REDUCTION INTERNAL FIXATION (ORIF) ELBOW/OLECRANON FRACTURE;  Surgeon: Altamese [place], MD;  Location: [place];  Service: Orthopedics;  Laterality: Left;  . ORIF HIP FRACTURE Left   . REPAIR EXTENSOR TENDON Left 12/29/2019   Procedure: LEFT LONG AND LEFT RING FRINGER EXTENSOR REALIGNAMENT WITH POSSIBLE TENDON TRANSFER;  Surgeon: Charlotte Crumb, MD;  Location: [place];  Service: Orthopedics;  Laterality: Left;  . virtual colonoscopy  04/24/2019   no polyps, no mass, no apple core lesion, no stricture    Family History  Adopted: Yes  Problem Relation Age of Onset  . Alcohol abuse Daughter     Social History   Socioeconomic History  . Marital status: Married    Spouse name: Not on file  . Number of children: 2  . Years of education: Not on file  . Highest education level: Not on file  Occupational History  . Occupation: disabled  Tobacco Use  . Smoking status: Never Smoker  . Smokeless tobacco: Never Used  Vaping Use  . Vaping Use: Never used  Substance and Sexual Activity  . Alcohol use: No  . Drug use: No  . Sexual activity: Not Currently    Birth control/protection: Post-menopausal  Other Topics Concern  . Not on file  Social History Narrative  . Not on file   Social Determinants of Health   Financial Resource Strain:   . Difficulty of Paying Living Expenses: Not on file  Food Insecurity:   . Worried About Charity fundraiser in the Last Year: Not on file  . Ran Out of Food in the Last Year: Not on file  Transportation Needs:   . Lack of Transportation (Medical): Not on file  . Lack of Transportation (Non-Medical): Not on file  Physical  Activity:   . Days of Exercise per Week: Not on file  . Minutes of Exercise per Session: Not on file  Stress:   . Feeling of Stress : Not on file  Social Connections:   . Frequency of Communication with Friends and Family: Not on file  . Frequency of Social Gatherings with Friends and Family: Not on file  . Attends Religious Services: Not on file  . Active Member of Clubs or Organizations: Not on file  . Attends Archivist Meetings: Not on file  . Marital Status: Not on file  Intimate Partner Violence:   . Fear of Current or Ex-Partner: Not on file  . Emotionally Abused: Not on file  . Physically Abused: Not on file  . Sexually Abused: Not on file  Outpatient Encounter Medications as of 09/09/2020  Medication Sig  . diclofenac Sodium (VOLTAREN) 1 % GEL Apply 2 g topically daily as needed (for pain).   . feeding supplement (ENSURE ENLIVE / ENSURE PLUS) LIQD Take 237 mLs by mouth 2 (two) times daily between meals.  . furosemide (LASIX) 40 MG tablet Take 1 tablet (40 mg total) by mouth 2 (two) times daily.  Marland Kitchen lactulose (CHRONULAC) 10 GM/15ML solution Take 30 mLs (20 g total) by mouth 3 (three) times daily.  Marland Kitchen levothyroxine (SYNTHROID, LEVOTHROID) 75 MCG tablet Take 1 tablet (75 mcg total) by mouth daily before breakfast.  . Multiple Vitamin (MULTIVITAMIN WITH MINERALS) TABS tablet Take 1 tablet by mouth daily.  Marland Kitchen omeprazole (PRILOSEC) 20 MG capsule Take 1 capsule (20 mg total) by mouth daily.  . ondansetron (ZOFRAN ODT) 4 MG disintegrating tablet Take 1 tablet (4 mg total) by mouth every 8 (eight) hours as needed for nausea or vomiting.  . potassium chloride SA (KLOR-CON) 20 MEQ tablet Take 2 tablets (40 mEq total) by mouth daily.  . rifaximin (XIFAXAN) 550 MG TABS tablet Take 1 tablet (550 mg total) by mouth 2 (two) times daily. Pt states she take once daily  . sertraline (ZOLOFT) 100 MG tablet Take 100 mg by mouth daily.  . sertraline (ZOLOFT) 50 MG tablet Take 1 tablet (50  mg total) by mouth daily.  Marland Kitchen spironolactone (ALDACTONE) 100 MG tablet Take 2 tablets (200 mg total) by mouth daily.  . traZODone (DESYREL) 50 MG tablet Take 1 tablet (50 mg total) by mouth at bedtime as needed for sleep.  . [DISCONTINUED] oxyCODONE (OXY IR/ROXICODONE) 5 MG immediate release tablet Take 1 tablet (5 mg total) by mouth every 12 (twelve) hours as needed for up to 3 days for severe pain.  . Oxycodone HCl 10 MG TABS 1 tab po tid prn pain   No facility-administered encounter medications on file as of 09/09/2020.    Allergies  Allergen Reactions  . Penicillins Swelling and Other (See Comments)    FACIAL SWELLING Did it involve swelling of the face/tongue/throat, SOB, or low BP? Yes Did it involve sudden or severe rash/hives, skin peeling, or any reaction on the inside of your mouth or nose? No Did you need to seek medical attention at a hospital or doctor's office? Unknown When did it last happen?30 years If all above answers are "NO", may proceed with cephalosporin use.   . Tylenol [Acetaminophen] Nausea Only  . Sulfa Antibiotics Itching and Rash  . Tramadol Swelling and Rash    ROS Review of Systems  Constitutional: Positive for fatigue. Negative for appetite change, chills and fever.  HENT: Negative for congestion, dental problem, ear pain and sore throat.   Eyes: Negative for discharge, redness and visual disturbance.  Respiratory: Negative for cough, chest tightness, shortness of breath and wheezing.   Cardiovascular: Negative for chest pain, palpitations and leg swelling.  Gastrointestinal: Positive for abdominal distention, abdominal pain and nausea. Negative for blood in stool, diarrhea and vomiting.  Genitourinary: Negative for difficulty urinating, dysuria, flank pain, frequency, hematuria and urgency.  Musculoskeletal: Positive for arthralgias. Negative for back pain, joint swelling, myalgias and neck stiffness.  Skin: Negative for pallor and rash.   Neurological: Negative for dizziness, speech difficulty, weakness and headaches.  Hematological: Negative for adenopathy. Does not bruise/bleed easily.  Psychiatric/Behavioral: Negative for confusion and sleep disturbance. The patient is not nervous/anxious.     PE; Blood pressure 118/76, pulse 90, temperature 98.4 F (36.9 C),  temperature source Oral, weight 111 lb (50.3 kg), SpO2 99 %. Gen: Alert, thin/frail, alert, chronically ill-appearing, sitting in WC.  Patient is oriented to person, place, time, and situation. RCV:ELFY: no injection, icteris, swelling, or exudate.  EOMI, PERRLA. Mouth: lips without lesion/swelling.  Oral mucosa pink and moist. Oropharynx without erythema, exudate, or swelling.  CV: RRR, 2/6 syst murmur, loudest RUSB, rad to infraclav region, no diastolic murmur.  No r/g. Chest is clear, no wheezing or rales. Normal symmetric air entry throughout both lung fields. No chest wall deformities or tenderness. ABD: distended, mildly tense, mild diffuse TTP.  Unable to palpate any organomegaly. EXT: no clubbing or cyanosis.  no edema.  L ankle: 2 cm round stage II ulcer over lateral malleolus.  No exudate or erythema.      Pertinent labs:  Lab Results  Component Value Date   TSH 6.506 (H) 08/13/2020   Lab Results  Component Value Date   WBC 3.9 (L) 09/08/2020   HGB 7.5 (L) 09/08/2020   HCT 24.7 (L) 09/08/2020   MCV 84.3 09/08/2020   PLT 33 (L) 09/08/2020   Lab Results  Component Value Date   CREATININE 0.59 09/08/2020   BUN 8 09/08/2020   NA 132 (L) 09/08/2020   K 3.2 (L) 09/08/2020   CL 96 (L) 09/08/2020   CO2 26 09/08/2020   Lab Results  Component Value Date   ALT 14 09/08/2020   AST 31 09/08/2020   ALKPHOS 71 09/08/2020   BILITOT 2.1 (H) 09/08/2020   Lab Results  Component Value Date   FERRITIN 36 08/13/2020   Lab Results  Component Value Date   INR 1.8 (H) 09/08/2020   INR 1.8 (H) 09/07/2020   INR 1.7 (H) 09/06/2020   ASSESSMENT AND  PLAN:   1) Chronic pain syndrome: osteoarthritis multiple sites, primarily knees and hands.   [facility]: start oxycodone 66m, 1 tid prn, #90. Will possibly get her in with diff pain mgmt MD in future. At f/u 1 wk I'll get CSC in chart and do UDS.  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 Most recent hosp 11/1-11/7, 2021. Cont lasix 492mbid, aldactone 20042md, xifaxan 550m40md, lactulose 30ml62m. Cont f/u with Dr. ArmbrHavery Moroser his last note 11/2019 she is due for EGD any time now (last one was 2019) for esoph varices monitoring.  BB use has not been pursued due to difficult-to -manage ascites.  TIPS procedure not pursued due to recurrent hepatic encephalopathy.   No focal hepatic lesion on imaging, her AFP tumor marker was 2.0 back in 11/2019. Per Dr. ArmbrDoyne Keel 12/01/19, "[facility] Zamor[facility] Scalee to her comorbidities and multiple falls, fragility, has not been worked up for transplant yet".   3) L ankle pressure ulcer, stage 2.  Recent small, nondisplaced cuneiform fracture noted on x-ray 08/13/20.  F/u x-ray 11/5 showed no fracture. Wet to dry dressing L ankle, possible wound clinic referral in near future if delayed healing.  4) Hypothyroidism: TSH 6.5 on 08/13/20. Plan repeat approx 10/2020. Cont T4 75 mcg qd.  5) Chronic nausea + abd pain: seems at baseline. Zofran. Hx of gallstones but no sign of acute cholecystitis.  6) Debilitated pt/gait instability: WC needed nearly 24/7 at this point. Rx for rolling walker given today. HH PT eval being done  today.  No chronic med changes. No labs today.  Spent 65 min with pt today reviewing HPI, reviewing recent hospitalization records/events, reviewing relevant past history, doing exam, reviewing and discussing lab and imaging  data, and formulating plans.  An After Visit Summary was printed and given to the patient.  Return in about 1 week (around 09/16/2020) for f/u chronic/multiple issues.  Signed:  Crissie Sickles, MD           09/09/2020

## 2020-09-10 ENCOUNTER — Ambulatory Visit: Payer: Medicare HMO | Admitting: Neurology

## 2020-09-10 NOTE — Telephone Encounter (Signed)
Placed on PCP desk to review and sign, if appropriate.  

## 2020-09-11 ENCOUNTER — Other Ambulatory Visit: Payer: Self-pay

## 2020-09-11 LAB — TOTAL BILIRUBIN, BODY FLUID: Total bilirubin, fluid: <0.2 mg/dL

## 2020-09-11 NOTE — Patient Outreach (Signed)
[facility]) Care Management  09/11/2020  Megan Roberson [birth year] [id number]   Telephone call to patient for follow up hospitalization. Patient states she has battled Cirrhosis for about 15 years.  She lives in the home with her spouse who provides all care.  Patient saw PCP on Monday and set to see GI on Thursday.  Discussed with patient cirrhosis and care and medication adherence.  Patient states she just cannot take the lactulose TID.  Explained to her the rationale for the lactulose and helping to control her liver disease by removing the ammonia that her liver cannot. She verbalized understanding. Patient reports that she has been playing phone tag with [facility] and [facility].  Patient states she will follow up with them.  CM gave patient the contact information for both. She declined needing assistance with reaching out to them.    Plan: RN CM will follow up with patient next week for follow up on home health and [facility].    Jone Baseman, RN, MSN [place] Management Care Management Coordinator Direct Line [phone] Cell [phone] Toll Free: [phone]  Fax: [phone]

## 2020-09-11 NOTE — Telephone Encounter (Signed)
Signed and put in box to go up front. Signed:  Crissie Sickles, MD           09/11/2020

## 2020-09-12 ENCOUNTER — Telehealth: Payer: Self-pay | Admitting: Gastroenterology

## 2020-09-12 NOTE — Telephone Encounter (Signed)
Lm on vm for patient to return call 

## 2020-09-12 NOTE — Telephone Encounter (Signed)
Spoke with patient, she states that she hurt her foot again and was looking to reschedule appt. Pt states that she can't even get to the car right now. Pt advised that next available with Dr. Havery Moros would be in December. Patient states that she will try her best to keep appt on 09/17/20.  Dr. Havery Moros, just an FYI,   Patient wanted to thank you for seeing her while she was in the hospital.

## 2020-09-12 NOTE — Telephone Encounter (Signed)
Pt is requesting a call back from a nurse, pt did not wish to provide further information

## 2020-09-12 NOTE — Telephone Encounter (Signed)
Okay thanks for letting me know. If she does not think she can make it tomorrow she should call us back, hopefully her foot is feeling better tomorrow. Thanks

## 2020-09-13 ENCOUNTER — Ambulatory Visit: Payer: Self-pay | Admitting: Gastroenterology

## 2020-09-17 ENCOUNTER — Encounter: Payer: Self-pay | Admitting: Family Medicine

## 2020-09-17 ENCOUNTER — Other Ambulatory Visit (INDEPENDENT_AMBULATORY_CARE_PROVIDER_SITE_OTHER): Payer: Medicare HMO

## 2020-09-17 ENCOUNTER — Ambulatory Visit (INDEPENDENT_AMBULATORY_CARE_PROVIDER_SITE_OTHER): Payer: Medicare HMO | Admitting: Family Medicine

## 2020-09-17 ENCOUNTER — Encounter: Payer: Self-pay | Admitting: Gastroenterology

## 2020-09-17 ENCOUNTER — Ambulatory Visit (INDEPENDENT_AMBULATORY_CARE_PROVIDER_SITE_OTHER): Payer: Medicare HMO | Admitting: Gastroenterology

## 2020-09-17 ENCOUNTER — Other Ambulatory Visit: Payer: Self-pay

## 2020-09-17 VITALS — BP 121/73 | HR 85 | Temp 98.3°F | Resp 16 | Ht 67.0 in | Wt 111.0 lb

## 2020-09-17 VITALS — BP 102/60 | HR 79 | Ht 67.0 in | Wt 115.0 lb

## 2020-09-17 DIAGNOSIS — Z79899 Other long term (current) drug therapy: Secondary | ICD-10-CM | POA: Diagnosis not present

## 2020-09-17 DIAGNOSIS — M25572 Pain in left ankle and joints of left foot: Secondary | ICD-10-CM

## 2020-09-17 DIAGNOSIS — E039 Hypothyroidism, unspecified: Secondary | ICD-10-CM

## 2020-09-17 DIAGNOSIS — K7682 Hepatic encephalopathy: Secondary | ICD-10-CM

## 2020-09-17 DIAGNOSIS — M8949 Other hypertrophic osteoarthropathy, multiple sites: Secondary | ICD-10-CM | POA: Diagnosis not present

## 2020-09-17 DIAGNOSIS — K7290 Hepatic failure, unspecified without coma: Secondary | ICD-10-CM

## 2020-09-17 DIAGNOSIS — M150 Primary generalized (osteo)arthritis: Secondary | ICD-10-CM

## 2020-09-17 DIAGNOSIS — K7460 Unspecified cirrhosis of liver: Secondary | ICD-10-CM | POA: Diagnosis not present

## 2020-09-17 DIAGNOSIS — K7031 Alcoholic cirrhosis of liver with ascites: Secondary | ICD-10-CM | POA: Diagnosis not present

## 2020-09-17 DIAGNOSIS — R188 Other ascites: Secondary | ICD-10-CM | POA: Diagnosis not present

## 2020-09-17 DIAGNOSIS — D649 Anemia, unspecified: Secondary | ICD-10-CM

## 2020-09-17 DIAGNOSIS — Z9181 History of falling: Secondary | ICD-10-CM | POA: Diagnosis not present

## 2020-09-17 DIAGNOSIS — R933 Abnormal findings on diagnostic imaging of other parts of digestive tract: Secondary | ICD-10-CM

## 2020-09-17 DIAGNOSIS — S91002D Unspecified open wound, left ankle, subsequent encounter: Secondary | ICD-10-CM | POA: Diagnosis not present

## 2020-09-17 DIAGNOSIS — E876 Hypokalemia: Secondary | ICD-10-CM | POA: Diagnosis not present

## 2020-09-17 DIAGNOSIS — R319 Hematuria, unspecified: Secondary | ICD-10-CM

## 2020-09-17 DIAGNOSIS — R2681 Unsteadiness on feet: Secondary | ICD-10-CM | POA: Diagnosis not present

## 2020-09-17 DIAGNOSIS — R5381 Other malaise: Secondary | ICD-10-CM

## 2020-09-17 DIAGNOSIS — M159 Polyosteoarthritis, unspecified: Secondary | ICD-10-CM

## 2020-09-17 DIAGNOSIS — G894 Chronic pain syndrome: Secondary | ICD-10-CM | POA: Diagnosis not present

## 2020-09-17 LAB — CBC WITH DIFFERENTIAL/PLATELET
Basophils Absolute: 0 10*3/uL (ref 0.0–0.1)
Basophils Relative: 0.6 % (ref 0.0–3.0)
Eosinophils Absolute: 0 10*3/uL (ref 0.0–0.7)
Eosinophils Relative: 0.6 % (ref 0.0–5.0)
HCT: 28 % — ABNORMAL LOW (ref 36.0–46.0)
Hemoglobin: 8.9 g/dL — ABNORMAL LOW (ref 12.0–15.0)
Lymphocytes Relative: 9.6 % — ABNORMAL LOW (ref 12.0–46.0)
Lymphs Abs: 0.5 10*3/uL — ABNORMAL LOW (ref 0.7–4.0)
MCHC: 31.8 g/dL (ref 30.0–36.0)
MCV: 78.9 fl (ref 78.0–100.0)
Monocytes Absolute: 0.7 10*3/uL (ref 0.1–1.0)
Monocytes Relative: 14.1 % — ABNORMAL HIGH (ref 3.0–12.0)
Neutro Abs: 3.8 10*3/uL (ref 1.4–7.7)
Neutrophils Relative %: 75.1 % (ref 43.0–77.0)
Platelets: 54 10*3/uL — ABNORMAL LOW (ref 150.0–400.0)
RBC: 3.55 Mil/uL — ABNORMAL LOW (ref 3.87–5.11)
RDW: 20.2 % — ABNORMAL HIGH (ref 11.5–15.5)
WBC: 5.1 10*3/uL (ref 4.0–10.5)

## 2020-09-17 LAB — FOLATE: Folate: 22.2 ng/mL (ref >5.9)

## 2020-09-17 LAB — BASIC METABOLIC PANEL
BUN: 7 mg/dL (ref 6–23)
CO2: 24 mEq/L (ref 19–32)
Calcium: 8.8 mg/dL (ref 8.4–10.5)
Chloride: 105 mEq/L (ref 96–112)
Creatinine, Ser: 0.57 mg/dL (ref 0.40–1.20)
GFR: 97.68 mL/min (ref >60.00)
Glucose, Bld: 90 mg/dL (ref 70–99)
Potassium: 4 mEq/L (ref 3.5–5.1)
Sodium: 137 mEq/L (ref 135–145)

## 2020-09-17 LAB — VITAMIN B12: Vitamin B-12: 990 pg/mL — ABNORMAL HIGH (ref 211–911)

## 2020-09-17 MED ORDER — FERROUS SULFATE 325 (65 FE) MG PO TBEC: 325.0000 mg | DELAYED_RELEASE_TABLET | Freq: Two times a day (BID) | ORAL | 1 refills | Status: DC

## 2020-09-17 NOTE — Progress Notes (Signed)
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  Please see prior clinic notes for full details of her case.  Her course has been complicated by difficult to treat ascites, electrolyte disturbances with diuretics, hepatic encephalopathy, bleeding varices remotely.  She has been followed by [facility] here in [place].  Thus far due to her comorbidities and multiple falls, fragility, has not been worked up for transplant yet.  I last saw her in January, while she was seen by one of our PAs in early October.  She has not been doing well lately.  She has been admitted 2 times, once in October, once in early November, for worsening ascites.  She has had difficulty maintaining compliance with diuretics.  More recently she was having worsening ascites with respiratory distress, had 2 paracentesis while admitted, ruled out for SBP.  She has been on Aldactone monotherapy with potassium supplementation and still has had problems with hypokalemia.  We have held off on using Lasix historically due to this issue.  Apparently she was discharged on Lasix 40 mg twice daily in addition to higher dose Aldactone at 200 mg a day.  She states she was on this but husband was out of town for a few days and she had a very hard time ambulating in light of her orthopedic issues so has not been taking much of the Lasix.  She struggles at home with taking her medications when her husband is not there.  She has had problems with falls in the past with imbalance of her gait.  I have referred her to neurology but she has still yet to be seen by them from what I can gather.  She is scheduled to see them next month.  She is concerned about left foot and ankle pain which has been an ongoing issue for her.  She had an x-ray recently which showed no obvious fracture.  She has been doing better following the paracentesis and is breathing much better.  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  She has had some constipation lately, not taking much of her lactulose because she does not like the way it makes her feel.  Has been using MiraLAX as needed which does help her move her bowels.  During this time of the hospitalization she has had a progressive worsening of her anemia over the past year, hemoglobins been in the sevens lately.  Iron studies appear okay, ferritin within normal range, iron sat slightly low.  She does endorse ongoing fatigue.  No blood in her stool that she is sees.  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  She has not been on beta-blockade given problems with ascites.  Husband endorses she has had some dysphagia lately to both solids and liquids, questions if this is due to varices or not.  She has been compliant with her rifaximin and other medications.  She denies any NSAID use.  She is not drinking any alcohol.  She does have some chronic orthopedic pain.  She is followed by pain management who apparently recently started her on oxycodone 10 mg 3 times daily as needed.  She has also suffered from chronic thrombocytopenia with platelets ranging from the 20s to 30s more recently.  Her INR most recently is 1.8. She states she is eating okay.  She has not thought to be a good TIPS candidate per hepatology evaluation  in the past.  Of note she has had some imaging abnormalities of her colon in recent weeks.  Recall that she had an optical colonoscopy that could not be completed due to edema and tortuosity.  A follow-up virtual colonoscopy was normal last year.  Most recent endoscopic workup:  EGD 03/15/2018 -Esophagogastric landmarks identified. - Trace to small esophageal varices without high risk stigmata - banding not performed in light of small size and thrombocytopenia. - Multiple white plaques in the esophagus. Brushings performed to rule out candidiasis. - Non-bleeding small  gastric ulcer with no stigmata of bleeding. - Gastritis. Biopsied to rule out H pylori - Normal duodenal bulb and second portion of the duodenum.,   Colonoscopy 03/15/18 - The perianal and digital rectal examinations were normal. Findings: The colon was redundant with severe looping. The entire colon was severely congested / edematous, consistent with changes due to portal hypertension. Most severe in the sigmoid colon which made visualization difficut. Due to severe looping / redundancy / edema, initial attempt at cecal intubation with pediatric colonoscope was not succesfull. The scope was exchanged for an adult colonoscope. The suspected ascending colon was reached but cecal intubation was not successful despite multiple attempts / abdominal pressure. Internal hemorrhoids were found during retroflexion.   CT scan abdomen 07/07/2018 - multiple vertebral fractures, fracture of right rib, remote pelvic fractures, "inflammation of the ascending colon", R adnexal cystic lesion  Virtual colonoscopy 04/24/19 - IMPRESSION: No significant colonic polyp, mass, apple core lesion, or stricture.  Cirrhosis. Splenomegaly. Cholelithiasis. 14 mm nonobstructing right lower pole renal calculus, without hydronephrosis. 4.6 cm right ovarian cystic lesion, better evaluated on recent pelvic MRI.   CT abdomen pelvis 09/03/20 - IMPRESSION: Slight interval improvement in probable infectious pulmonary infiltrate within the right lower lobe.  Cirrhosis. 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 Gastroesophageal and perirectal portosystemic collateral varices are identified.  Resolved circumferential wall thickening involving the distal colon noted on prior examination, likely infectious or inflammatory process at that time. New similar appearing mural thickening involving the ascending colon, likely  infectious or inflammatory. Given its restricted territory, portal colopathy is considered less Likely.   CT chest 08/16/20 - IMPRESSION: Right lower lobe airspace disease has an appearance most consistent with pneumonia and is unchanged since yesterday's exam.  Very small bilateral pleural effusions. No air is seen in the left effusion on today's study.  Cirrhosis, small volume of abdominal ascites and portal venous hypertension as seen on CT scan yesterday.  CT abdomen / pelvis 08/15/20 - IMPRESSION: 1. New ground-glass opacity within the right lower lobe suspicious for pneumonia. 2. Small left pleural effusion now containing a small amount of air. Etiology is unclear in the absence of recent thoracentesis. Consider CT chest for further evaluation. 3. Interval development of long segment mild wall thickening within the sigmoid and descending colon. No definite pericolonic inflammatory changes, although this evaluation is significantly limited given the presence of abdominopelvic ascites. Findings may represent an infectious or inflammatory colitis versus changes related to portal colopathy. 4. Mild right-sided hydronephrosis. 12 x 7 mm stone remains present within the right renal pelvis. 5. Cirrhosis with evidence of portal hypertension including splenomegaly and small to moderate volume ascites. 6. Cholelithiasis.    Past Medical History:  Diagnosis Date  . Anxiety and depression   . Cholelithiasis without cholecystitis   . Chronic abdominal pain   . Chronic nausea    + abd pain  .  Cirrhosis of liver with ascites (HCC)    Alc cirrhosis; hx of multiple abd paracentesis, +esoph varices (+banding), pancytopenia, hx of hepatic enceph, hx of hydrothorax  . Heart murmur    Echo 10/06/17 [facility]): Normal LV/RV size, basal septal hypertophy-normal variant, LVEF 65-70%, normal LV/RV function, est RAP 5 mmHg, no sign valvular stenosis or regurg--trace MR/TR/PR  . History of  abnormal cervical Pap smear   . History of pneumonia   . Hypothyroidism   . Insomnia   . Nephrolithiasis    right renal pelvis, 13-24m nonobst stone  . Neuropathy    unclear hx  . Osteoarthritis, multiple sites    primarily hands and knees  . Osteoporosis 02/09/2018  . Ovarian cyst    bilat.  Stable on MRI pelvis 11/2019 and CT abd/pelv 09/2020--no f/u imaging indicated.  . Pancytopenia ([facility]   . Splenomegaly   . Thrombocytopenia ([facility] 12/01/2019   severe, hx of plts down into 30s     Past Surgical History:  Procedure Laterality Date  . AUGMENTATION MAMMAPLASTY Bilateral    20 years ago  . BIOPSY  03/15/2018   gastric.  Procedure: BIOPSY;  Surgeon: AYetta Flock MD;  [facility];  Service: Gastroenterology;;  Gastric  . COLONOSCOPY WITH ESOPHAGOGASTRODUODENOSCOPY (EGD) AND ESOPHAGEAL DILATION (ED)    . COLONOSCOPY WITH PROPOFOL N/A 03/15/2018   Redundant colon, severe looping, very edematous colon c/w portal HTN.  Procedure: COLONOSCOPY WITH PROPOFOL;  Surgeon: AYetta Flock MD;  Location: [facility];  Service: Gastroenterology;  Laterality: N/A;  . ESOPHAGOGASTRODUODENOSCOPY (EGD) WITH PROPOFOL N/A 03/15/2018   Gastritis, small gastric ulcer. Procedure: ESOPHAGOGASTRODUODENOSCOPY (EGD) WITH PROPOFOL;  Surgeon: AYetta Flock MD;  Location: [facility];  Service: Gastroenterology;  Laterality: N/A;  . ESOPHAGOGASTRODUODENOSCOPY W/ BANDING     Varices  . FRACTURE SURGERY Left    x 2 L elbow and hip left   . HARDWARE REMOVAL Left 09/17/2017   Procedure: HARDWARE REMOVAL LEFT OLECRANON;  Surgeon: HAltamese [place] MD;  [facility]  Service: Orthopedics;  Laterality: Left;  . IR PARACENTESIS  11/18/2017  . IR PARACENTESIS  11/26/2017  . IR PARACENTESIS  03/14/2018  . IR PARACENTESIS  08/08/2020  . KNEE SURGERY Right    open incision for ligaments  . ORIF ELBOW FRACTURE Left 04/30/2017   Procedure: REVISION OPEN REDUCTION INTERNAL FIXATION (ORIF)  ELBOW/OLECRANON FRACTURE;  Surgeon: HAltamese [place] MD;  [facility]  Service: Orthopedics;  Laterality: Left;  . ORIF HIP FRACTURE Left   . REPAIR EXTENSOR TENDON Left 12/29/2019   Procedure: LEFT LONG AND LEFT RING FRINGER EXTENSOR REALIGNAMENT WITH POSSIBLE TENDON TRANSFER;  Surgeon: WCharlotte Crumb MD;  [facility]  Service: Orthopedics;  Laterality: Left;  . virtual colonoscopy  04/24/2019   no polyps, no mass, no apple core lesion, no stricture   Family History  Adopted: Yes  Problem Relation Age of Onset  . Alcohol abuse Daughter    Social History   Tobacco Use  . Smoking status: Never Smoker  . Smokeless tobacco: Never Used  Vaping Use  . Vaping Use: Never used  Substance Use Topics  . Alcohol use: No  . Drug use: No   Current Outpatient Medications  Medication Sig Dispense Refill  . diclofenac Sodium (VOLTAREN) 1 % GEL Apply 2 g topically daily as needed (for pain).     . feeding supplement (ENSURE ENLIVE / ENSURE PLUS) LIQD Take 237 mLs by mouth 2 (two) times daily between meals. 237 mL  12  . furosemide (LASIX) 40 MG tablet Take 1 tablet (40 mg total) by mouth 2 (two) times daily. 60 tablet 1  . lactulose (CHRONULAC) 10 GM/15ML solution Take 30 mLs (20 g total) by mouth 3 (three) times daily. 1892 mL 3  . levothyroxine (SYNTHROID, LEVOTHROID) 75 MCG tablet Take 1 tablet (75 mcg total) by mouth daily before breakfast. 30 tablet 0  . Multiple Vitamin (MULTIVITAMIN WITH MINERALS) TABS tablet Take 1 tablet by mouth daily.    Marland Kitchen omeprazole (PRILOSEC) 20 MG capsule Take 1 capsule (20 mg total) by mouth daily. 30 capsule 5  . ondansetron (ZOFRAN ODT) 4 MG disintegrating tablet Take 1 tablet (4 mg total) by mouth every 8 (eight) hours as needed for nausea or vomiting. 40 tablet 3  . Oxycodone HCl 10 MG TABS 1 tab po tid prn pain 90 tablet 0  . potassium chloride SA (KLOR-CON) 20 MEQ tablet Take 2 tablets (40 mEq total) by mouth daily. 30 tablet 2  . rifaximin (XIFAXAN)  550 MG TABS tablet Take 1 tablet (550 mg total) by mouth 2 (two) times daily. Pt states she take once daily 60 tablet 1  . sertraline (ZOLOFT) 100 MG tablet Take 100 mg by mouth daily.    . sertraline (ZOLOFT) 50 MG tablet Take 1 tablet (50 mg total) by mouth daily. 4 tablet 0  . spironolactone (ALDACTONE) 100 MG tablet Take 2 tablets (200 mg total) by mouth daily. 30 tablet 2  . traZODone (DESYREL) 50 MG tablet Take 1 tablet (50 mg total) by mouth at bedtime as needed for sleep. 4 tablet 0   No current facility-administered medications for this visit.   Allergies  Allergen Reactions  . Penicillins Swelling and Other (See Comments)    FACIAL SWELLING Did it involve swelling of the face/tongue/throat, SOB, or low BP? Yes Did it involve sudden or severe rash/hives, skin peeling, or any reaction on the inside of your mouth or nose? No Did you need to seek medical attention at a hospital or doctor's office? Unknown When did it last happen?30 years If all above answers are "NO", may proceed with cephalosporin use.   . Tylenol [Acetaminophen] Nausea Only  . Sulfa Antibiotics Itching and Rash  . Tramadol Swelling and Rash     Review of Systems: All systems reviewed and negative except where noted in HPI.    DG Chest 2 View  Result Date: 09/08/2020 CLINICAL DATA:  Follow-up right pneumonia EXAM: CHEST - 2 VIEW COMPARISON:  09/03/2020 chest radiograph. FINDINGS: Stable cardiomediastinal silhouette with normal heart size. No pneumothorax. Small stable right pleural effusion. No left pleural effusion. Resolved right mid lung hazy opacity. Residual mild patchy right costophrenic angle opacity is similar. IMPRESSION: 1. Stable small right pleural effusion. 2. Overall improved right lung aeration with residual mild right costophrenic angle opacity. Electronically Signed   By: Ilona Sorrel M.D.   On: 09/08/2020 14:49   DG Ankle 2 Views Left  Result Date: 09/06/2020 CLINICAL DATA:   64 year old female left ankle pain and ulceration EXAM: LEFT ANKLE - 2 VIEW COMPARISON:  None FINDINGS: Osteopenia. No acute displaced fracture. Pes cavus partially imaged, with plantar flexion. Lucency within the soft tissues overlying the lateral malleolus compatible with the given history. No radiopaque foreign body. IMPRESSION: Negative for acute bony abnormality. Evidence of skin defect overlying the lateral malleolus as is the given history. No evidence of osteomyelitis. Osteopenia. Electronically Signed   By: Corrie Mckusick D.O.   On: 09/06/2020  16:26   CT ABDOMEN PELVIS W CONTRAST  Result Date: 09/03/2020 CLINICAL DATA:  Abdominal distension, hematuria EXAM: CT ABDOMEN AND PELVIS WITH CONTRAST TECHNIQUE: Multidetector CT imaging of the abdomen and pelvis was performed using the standard protocol following bolus administration of intravenous contrast. CONTRAST:  49m OMNIPAQUE IOHEXOL 300 MG/ML  SOLN COMPARISON:  08/15/2020 FINDINGS: Lower chest: Ground-glass pulmonary infiltrate within the right lower lobe has improved slightly in the interval since prior examination, likely infectious in etiology. Moderate left pleural effusion is present, increased in size since prior exam. Compressive atelectasis of the left lower lobe noted. Cardiac size within normal limits. Gastroesophageal varices are noted. Hepatobiliary: Cirrhosis. No enhancing intrahepatic mass is clearly identified. There is dilation of the right superior portal vein likely related to an underlying intrahepatic portal venous shunt. Cholelithiasis again noted without obvious pericholecystic inflammatory change, though evaluation is slightly limited by the presence of ascites. No intra or extrahepatic biliary ductal dilation. Pancreas: Unremarkable Spleen: The spleen is enlarged, unchanged from prior examination, measuring 15.6 cm in greatest dimension, in keeping with changes of portal venous hypertension. No intrasplenic lesions are seen. The  splenic vein is patent. Adrenals/Urinary Tract: The adrenal glands are unremarkable. 13 mm x 14 mm nonobstructing calculus is seen within the right renal pelvis. The kidneys are otherwise unremarkable. The bladder is decompressed with a Foley catheter balloon seen within its lumen. Stomach/Bowel: The stomach and small bowel are unremarkable. Previously noted circumferential wall thickening involving the descending and proximal sigmoid colon has resolved suggesting that the findings on prior examination were related to and a infectious or inflammatory process at that time. There has developed mild surf rental thickening, similar to the prior examination, now involving the ascending colon. No evidence of obstruction or perforation. No free intraperitoneal gas. Large volume ascites has progressed since prior examination. Vascular/Lymphatic: Recanalization of the umbilical vein is present in keeping with portosystemic collateralization. Similarly, several and rectal varices are noted within the left lateral wall of the rectum. The abdominal aorta is of normal caliber. No pathologic adenopathy within the abdomen and pelvis. Reproductive: Simple appearing cysts are seen within the adnexa bilaterally, stable since prior examination. There size does not warrant further follow-up. Uterus unremarkable. Other: Bilateral breast implants are partially visualized. Musculoskeletal: Left hip ORIF has been performed. Healed left iliac crest fracture. Compression fractures T12 and superior endplate fracture of L1 are unchanged from prior examination no acute bone abnormality. IMPRESSION: Slight interval improvement in probable infectious pulmonary infiltrate within the right lower lobe. Cirrhosis. 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 Gastroesophageal and perirectal portosystemic collateral varices are identified. Resolved  circumferential wall thickening involving the distal colon noted on prior examination, likely infectious or inflammatory process at that time. New similar appearing mural thickening involving the ascending colon, likely infectious or inflammatory. Given its restricted territory, portal colopathy is considered less likely. Electronically Signed   By: AFidela SalisburyMD   On: 09/03/2020 02:48   [place]acentesis  Result Date: 09/06/2020 INDICATION: Recurrent ascites, cirrhosis EXAM: ULTRASOUND GUIDED THERAPEUTIC PARACENTESIS MEDICATIONS: None COMPLICATIONS: None immediate PROCEDURE: Informed written consent was obtained from the patient after a discussion of the risks, benefits and alternatives to treatment. A timeout was performed prior to the initiation of the procedure. Initial ultrasound scanning demonstrates a small amount of ascites within the right lower abdominal quadrant. The right lower abdomen was prepped and draped in the usual sterile fashion. 1% lidocaine was used  for local anesthesia. Following this, a 5 [place] Yueh catheter was introduced. An ultrasound image was saved for documentation purposes. The paracentesis was performed. The catheter was removed and a dressing was applied. The patient tolerated the procedure well without immediate post procedural complication. Patient received post-procedure intravenous albumin; see nursing notes for details. FINDINGS: A total of approximately 800 mL of amber colored ascitic fluid was removed. Anticipated 1-2 L of fluid to be aspirated but repeat ultrasound demonstrated minimal residual fluid in the RIGHT lower quadrant at the conclusion of the procedure. IMPRESSION: Successful ultrasound-guided paracentesis yielding 800 mL of peritoneal fluid. Electronically Signed   By: Lavonia Dana M.D.   On: 09/06/2020 12:56   US Paracentesis  Result Date: 09/03/2020 INDICATION: Cirrhosis, ascites EXAM: ULTRASOUND GUIDED DIAGNOSTIC AND THERAPEUTIC PARACENTESIS  MEDICATIONS: None COMPLICATIONS: None immediate PROCEDURE: Informed written consent was obtained from the patient after a discussion of the risks, benefits and alternatives to treatment. A timeout was performed prior to the initiation of the procedure. Initial ultrasound scanning demonstrates a large amount of ascites within the LEFT lower abdominal quadrant. The right lower abdomen was prepped and draped in the usual sterile fashion. 1% lidocaine was used for local anesthesia. Following this, a 5 [place] Yueh catheter was introduced. An ultrasound image was saved for documentation purposes. The paracentesis was performed. The catheter was removed and a dressing was applied. The patient tolerated the procedure well without immediate post procedural complication. Patient received post-procedure intravenous albumin; see nursing notes for details. FINDINGS: A total of approximately 5.5 L of clear light yellow ascitic fluid was removed. Samples were sent to the laboratory as requested by the clinical team. IMPRESSION: Successful ultrasound-guided paracentesis yielding 5.5 liters of peritoneal fluid. Electronically Signed   By: Lavonia Dana M.D.   On: 09/03/2020 10:39   DG CHEST PORT 1 VIEW  Result Date: 09/03/2020 CLINICAL DATA:  Abdominal pain and distension.  Previous pneumonia. EXAM: PORTABLE CHEST 1 VIEW COMPARISON:  CT of the chest 08/16/2020 FINDINGS: Right-sided airspace disease is similar the prior exam. Fluid in the minor fissure and right CP angle is again seen. Minimal fluid and atelectasis is present at the left base. Heart size is normal. Lung volumes are low. IMPRESSION: 1. Stable right-sided airspace disease and effusion. Findings are concerning for pneumonia. 2. Minimal fluid and atelectasis at the left base. Electronically Signed   By: San Morelle M.D.   On: 09/03/2020 12:01   DG Abdomen Acute W/Chest  Result Date: 09/03/2020 CLINICAL DATA:  Abdominal pain, shortness of breath EXAM: DG  ABDOMEN ACUTE WITH 1 VIEW CHEST COMPARISON:  08/15/2020 FINDINGS: Previously seen 12 mm right renal pelvic stone again noted. There is gaseous distention of the colon, likely mild ileus. No small bowel distention or free air. No organomegaly. Heart is normal size. Scarring at the right lung base. Left lung clear. IMPRESSION: Mild gaseous distention of the colon, likely ileus. Right renal pelvic stone again noted. Right basilar scarring. Electronically Signed   By: Rolm Baptise M.D.   On: 09/03/2020 00:49    Physical Exam: BP 102/60   Pulse 79   Ht 5' 7"  (1.702 m)   Wt 115 lb (52.2 kg)   SpO2 98%   BMI 18.01 kg/m  Constitutional: Pleasant female in no acute distress, sitting in wheelchair HEENT: Normocephalic and atraumatic.  Neck supple.  Cardiovascular: Normal rate, regular rhythm.  Pulmonary/chest: Effort normal and breath sounds normal.  Abdominal: Soft, distended with ascites, nontender.   Extremities: no  edema LE but left ankle swollen and bruised Lymphadenopathy: No cervical adenopathy noted. Neurological: Alert and oriented to person place and time. Skin: Skin is warm and dry. No rashes noted. Psychiatric: Normal mood and affect. Behavior is normal.   ASSESSMENT AND PLAN: 64 year old female here for reassessment the following:  Cirrhosis with refractory ascites Hepatic encephalopathy Suspected esophageal varices Abnormal imaging of the GI tract Left ankle / foot pain History of falls  Very complicated case of cirrhosis with decompensation.  She has been evaluated by hepatology in the past but has been too frail to undergo work-up for transplant.  She has had a history of recurrent falls with orthopedic injuries.  I have referred her to neurology several months ago but she has never made it to an appointment, she is due to see them next month and should follow-up with that.  I am concerned about her recurrent ascites in need of repeated paracentesis and 2 hospitalizations in  the past 2 months.  She has not followed closely with instructions for diuretics, she has had problems with hypokalemia in the setting of Lasix use, she had that resumed recently but again stopped it on her own while her husband was out of town.  She is not a good candidate for TIPS based on prior evaluation by hepatology.  I discussed options moving forward with her.  I am going to check her labs today to make sure her electrolytes are okay and will adjust her diuretics as needed however if she is going to be on diuretics in the just the dose she must follow-up with repeat labs when we ask her to.  I will schedule her for another large-volume paracentesis later this week to give albumin if more than 5 L are removed and attempts to keep her on the hospital prevent recurrent ascites with hepatic hydrothorax.  She needs to be maintained on a low-sodium diet.  I suspect she has esophageal varices based off recent imaging, however she is not a good candidate for beta-blockade given her worsening ascites.  Further, she is higher than average risk for banding of varices given her chronic thrombocytopenia to the 20s to 30s.  At the same time I'm concerned about her risk for hemorrhage if we do not treat the varices.  I will get a touch base with her hepatologist about this situation, although I suspect we may need to pursue a EGD at the hospital with banding of varices, will ask them if they recommend Doptelet prior to banding of the varices.  I will repeat CBC today in light of her progressive anemia noted during her last hospitalization.  Her iron studies appear okay although iron sat slightly low, will place her on iron.  Also in light of her pancytopenia will refer her to hematology.  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  Her colonoscopy did not show any concerning findings last year however she has significant edema and tortuosity which prohibited a full exam.  A follow-up  virtual colonoscopy seemed okay.  She has had other CT scan showing thickening of her colon, I think probably due to portal hypertension or reactive/inflammatory, again this will be very challenging to get her to tolerate a prep in regards to possible colonoscopy, as well as failed procedure in the past and will think she would tolerate another colonoscopy very well.  In regards to encephalopathy I recommend she continue her lactulose and use that instead of MiraLAX for constipation.  She was placed on chronic narcotics by pain physician for her orthopedic complaints.  I do not recommend long-term narcotics for this issue in light of her encephalopathy and recommend she avoid those as well as NSAIDs.  I recommend she follow-up with her orthopedist for further evaluation of her foot pain.  I will see her again in 4 to 6 weeks.  I otherwise recommend she follow-up with her hepatologist as well.  I will touch base with them in regards to the question of whether or not to proceed with banding of esophageal varices at the hospital, given her chronic thrombocytopenia, this is a difficult situation.  I will contact her with additional recommendations once I hear back from them and get results of her labs.  She agreed with the plan.  [place] Cellar, MD [facility]

## 2020-09-17 NOTE — Addendum Note (Signed)
Addended by: Octaviano Glow on: 09/17/2020 12:29 PM   Modules accepted: Orders

## 2020-09-17 NOTE — Patient Instructions (Addendum)
If you are age 62 or older, your body mass index should be between 23-30. Your Body mass index is 18.01 kg/m. If this is out of the aforementioned range listed, please consider follow up with your Primary Care Provider.  If you are age 23 or younger, your body mass index should be between 19-25. Your Body mass index is 18.01 kg/m. If this is out of the aformentioned range listed, please consider follow up with your Primary Care Provider.   You have been scheduled for an abdominal paracentesis at [facility]  radiology (1st floor of hospital) on Thursday, 09-19-20 at 9:00am. Please arrive at least 15 minutes prior to your appointment time for registration. Should you need to reschedule this appointment for any reason, please call our office at [phone].  Please go to the lab in the basement of our building to have lab work done as you leave today. Hit "B" for basement when you get on the elevator.  When the doors open the lab is on your left.  We will call you with the results. Thank you.  Due to recent changes in healthcare laws, you may see the results of your imaging and laboratory studies on MyChart before your provider has had a chance to review them.  We understand that in some cases there may be results that are confusing or concerning to you. Not all laboratory results come back in the same time frame and the provider may be waiting for multiple results in order to interpret others.  Please give [place] 48 hours in order for your provider to thoroughly review all the results before contacting the office for clarification of your results.   We have sent the following medications to your pharmacy for you to pick up at your convenience: Ferrous sulfate: Take 2 times a day with meals (this will make your stool dark)   Please follow up with Hepatology.  Please follow up with Neurology.  We will refer you to Hematology for Anemia.  We will refer you to Orthopedics for ankle pain.  You have been  scheduled for a follow up appointment on Monday, 11-12-19 at 10:10am.  Thank you for entrusting me with your care and for choosing [facility], Dr. Penrose Cellar

## 2020-09-17 NOTE — Progress Notes (Signed)
OFFICE VISIT  09/17/2020  CC:  Chief Complaint  Patient presents with  . Follow-up    RCI    HPI:    Patient is a 64 y.o. Caucasian female who presents accompanied by her husband for 1 week f/u multiple chronic medical problems. I saw her for her "establish care" visit last week. A/P as of last visit: "1) Chronic pain syndrome: osteoarthritis multiple sites, primarily knees and hands.   Stop [facility]: start oxycodone 37m, 1 tid prn, #90. Will possibly get her in with diff pain mgmt MD in future. At f/u 1 wk I'll get CSC in chart and do UDS.  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 Most recent hosp 11/1-11/7, 2021. Cont lasix 487mbid, aldactone 20070md, xifaxan 550m20md, lactulose 30ml63m. Cont f/u with Dr. ArmbrHavery Moroser his last note 11/2019 she is due for EGD any time now (last one was 2019) for esoph varices monitoring.  BB use has not been pursued due to difficult-to -manage ascites.  TIPS procedure not pursued due to recurrent hepatic encephalopathy.   No focal hepatic lesion on imaging, her AFP tumor marker was 2.0 back in 11/2019. Per Dr. ArmbrDoyne Keel 12/01/19, "[facility] Zam[facility] Scaleto her comorbidities and multiple falls, fragility, has not been worked up for transplant yet".   3) L ankle pressure ulcer, stage 2.  Recent small, nondisplaced cuneiform fracture noted on x-ray 08/13/20.  F/u x-ray 11/5 showed no fracture. Wet to dry dressing L ankle, possible wound clinic referral in near future if delayed healing.  4) Hypothyroidism: TSH 6.5 on 08/13/20. Plan repeat approx 10/2020. Cont T4 75 mcg qd.  5) Chronic nausea + abd pain: seems at baseline. Zofran. Hx of gallstones but no sign of acute cholecystitis.  6) Debilitated pt/gait instability: WC needed  nearly 24/7 at this point. Rx for rolling walker given today. HH PT eval being done today.  No chronic med changes. No labs today."  INTERIM HX: She saw Dr. ArmbrHavery Moros morning and she is now scheduled for abd paracentesis in 2 d. She also got blood drawn: cbc, bmet, afp, folate, b12 all ordered. She was put on iron bid. She was referred to hematology for further eval of her pancytopenia. Also was referred to orthopedics for her ankle pain. She was advised to f/u with hepatology (Dr. ZamorZollie ScaleSO) [place] [facility] (gait instability, set for 10/2020). Next f/u with Dr. ArmbrHavery Moros/10/22.  Pain control better on oxycodone 10mg:44mer abd discomfort and hands & back pain a little better on this med.  Denies constipation from this med.  No change in mental status. Taking it 1 tab tid since I saw her a week ago. She hit ankle against metal bedframe again, worsening her L ankle pain. Has made appt with Dr. WhitfiDurward Forteso, set now for 09/23/20.  She'll be getting paracentesis q2 wks scheduled now. No changes in diuretics---lasix 40mg b47mnd aldactone 200mg qd4m intake remains pretty poor. HH PT still not set up.    PMP AWARE reviewed today: most recent rx for oxycodone 10mg was27mled 09/09/20, # 90, rx by91e.  No red flags.  Past Medical History:  Diagnosis Date  . Anxiety and depression   . Cholelithiasis without cholecystitis   . Chronic abdominal pain   . Chronic nausea    +  abd pain  . Cirrhosis of liver with ascites (HCC)    Alc cirrhosis; hx of multiple abd paracentesis, +esoph varices (+banding), pancytopenia, hx of hepatic enceph, hx of hydrothorax  . Heart murmur    Echo 10/06/17 [facility]): Normal LV/RV size, basal septal hypertophy-normal variant, LVEF 65-70%, normal LV/RV function, est RAP 5 mmHg, no sign valvular stenosis or regurg--trace MR/TR/PR  . History of abnormal cervical Pap smear   . History of pneumonia   . Hypothyroidism   . Insomnia   .  Nephrolithiasis    right renal pelvis, 13-82m nonobst stone  . Neuropathy    unclear hx  . Osteoarthritis, multiple sites    primarily hands and knees  . Osteoporosis 02/09/2018  . Ovarian cyst    bilat.  Stable on MRI pelvis 11/2019 and CT abd/pelv 09/2020--no f/u imaging indicated.  . Pancytopenia ([place]   . Splenomegaly   . [place] 12/01/2019   severe, hx of plts down into 30s    Past Surgical History:  Procedure Laterality Date  . AUGMENTATION MAMMAPLASTY Bilateral    20 years ago  . BIOPSY  03/15/2018   gastric.  Procedure: BIOPSY;  Surgeon: AYetta Flock MD;  [facility];  Service: Gastroenterology;;  Gastric  . COLONOSCOPY WITH ESOPHAGOGASTRODUODENOSCOPY (EGD) AND ESOPHAGEAL DILATION (ED)    . COLONOSCOPY WITH PROPOFOL N/A 03/15/2018   Redundant colon, severe looping, very edematous colon c/w portal HTN.  Procedure: COLONOSCOPY WITH PROPOFOL;  Surgeon: AYetta Flock MD;  [facility];  Service: Gastroenterology;  Laterality: N/A;  . ESOPHAGOGASTRODUODENOSCOPY (EGD) WITH PROPOFOL N/A 03/15/2018   Gastritis, small gastric ulcer. Procedure: ESOPHAGOGASTRODUODENOSCOPY (EGD) WITH PROPOFOL;  Surgeon: AYetta Flock MD;  Location: [facility];  Service: Gastroenterology;  Laterality: N/A;  . ESOPHAGOGASTRODUODENOSCOPY W/ BANDING     Varices  . FRACTURE SURGERY Left    x 2 L elbow and hip left   . HARDWARE REMOVAL Left 09/17/2017   Procedure: HARDWARE REMOVAL LEFT OLECRANON;  Surgeon: HAltamese Shoreline MD;  [place] [facility]  Service: Orthopedics;  Laterality: Left;  . IR PARACENTESIS  11/18/2017  . IR PARACENTESIS  11/26/2017  . IR PARACENTESIS  03/14/2018  . IR PARACENTESIS  08/08/2020  . KNEE SURGERY Right    open incision for ligaments  . ORIF ELBOW FRACTURE Left 04/30/2017   Procedure: REVISION OPEN REDUCTION INTERNAL FIXATION (ORIF) ELBOW/OLECRANON FRACTURE;  Surgeon: HAltamese Storey MD;  [place]  Service:  Orthopedics;  Laterality: Left;  . ORIF HIP FRACTURE Left   . REPAIR EXTENSOR TENDON Left 12/29/2019   Procedure: LEFT LONG AND LEFT RING FRINGER EXTENSOR REALIGNAMENT WITH POSSIBLE TENDON TRANSFER;  Surgeon: WCharlotte Crumb MD;  [place]  Service: Orthopedics;  Laterality: Left;  . virtual colonoscopy  04/24/2019   no polyps, no mass, no apple core lesion, no stricture    Outpatient Medications Prior to Visit  Medication Sig Dispense Refill  . diclofenac Sodium (VOLTAREN) 1 % GEL Apply 2 g topically daily as needed (for pain).     . feeding supplement (ENSURE ENLIVE / ENSURE PLUS) LIQD Take 237 mLs by mouth 2 (two) times daily between meals. 237 mL 12  . ferrous sulfate 325 (65 FE) MG EC tablet Take 1 tablet (325 mg total) by mouth in the morning and at bedtime. 180 tablet 1  . furosemide (LASIX) 40 MG tablet Take 1 tablet (40 mg total) by mouth 2 (two) times daily. 60 tablet 1  . lactulose (CHRONULAC) 10 GM/15ML  solution Take 30 mLs (20 g total) by mouth 3 (three) times daily. 1892 mL 3  . levothyroxine (SYNTHROID, LEVOTHROID) 75 MCG tablet Take 1 tablet (75 mcg total) by mouth daily before breakfast. 30 tablet 0  . Multiple Vitamin (MULTIVITAMIN WITH MINERALS) TABS tablet Take 1 tablet by mouth daily.    Marland Kitchen omeprazole (PRILOSEC) 20 MG capsule Take 1 capsule (20 mg total) by mouth daily. 30 capsule 5  . ondansetron (ZOFRAN ODT) 4 MG disintegrating tablet Take 1 tablet (4 mg total) by mouth every 8 (eight) hours as needed for nausea or vomiting. 40 tablet 3  . Oxycodone HCl 10 MG TABS 1 tab po tid prn pain 90 tablet 0  . potassium chloride SA (KLOR-CON) 20 MEQ tablet Take 2 tablets (40 mEq total) by mouth daily. 30 tablet 2  . rifaximin (XIFAXAN) 550 MG TABS tablet Take 1 tablet (550 mg total) by mouth 2 (two) times daily. Pt states she take once daily 60 tablet 1  . sertraline (ZOLOFT) 100 MG tablet Take 100 mg by mouth daily.    . sertraline (ZOLOFT) 50 MG tablet Take 1 tablet (50  mg total) by mouth daily. 4 tablet 0  . spironolactone (ALDACTONE) 100 MG tablet Take 2 tablets (200 mg total) by mouth daily. 30 tablet 2  . traZODone (DESYREL) 50 MG tablet Take 1 tablet (50 mg total) by mouth at bedtime as needed for sleep. 4 tablet 0   No facility-administered medications prior to visit.    Allergies  Allergen Reactions  . Penicillins Swelling and Other (See Comments)    FACIAL SWELLING Did it involve swelling of the face/tongue/throat, SOB, or low BP? Yes Did it involve sudden or severe rash/hives, skin peeling, or any reaction on the inside of your mouth or nose? No Did you need to seek medical attention at a hospital or doctor's office? Unknown When did it last happen?30 years If all above answers are "NO", may proceed with cephalosporin use.   . Tylenol [Acetaminophen] Nausea Only  . Sulfa Antibiotics Itching and Rash  . Tramadol Swelling and Rash    ROS As per HPI  PE: Vitals with BMI 09/17/2020 09/17/2020 09/09/2020  Height 5' 7"  5' 7"  -  Weight 111 lbs 115 lbs 111 lbs  BMI 17.38 67.34 19.37  Systolic 902 409 735  Diastolic 73 60 76  Pulse 85 79 90     Gen: Alert, well appearing.  Patient is oriented to person, place, time, and situation. AFFECT: pleasant, lucid thought and speech. EXT: no edema.  L mallolus region with dime-sized superficial ulceration without bleeding or exudate.  No surrounding erythema or swelling.  No foul odor.  Minimal TTP about the L ankle.  LABS:  Lab Results  Component Value Date   TSH 6.506 (H) 08/13/2020   Lab Results  Component Value Date   WBC 3.9 (L) 09/08/2020   HGB 7.5 (L) 09/08/2020   HCT 24.7 (L) 09/08/2020   MCV 84.3 09/08/2020   PLT 33 (L) 09/08/2020   Lab Results  Component Value Date   CREATININE 0.59 09/08/2020   BUN 8 09/08/2020   NA 132 (L) 09/08/2020   K 3.2 (L) 09/08/2020   CL 96 (L) 09/08/2020   CO2 26 09/08/2020   Lab Results  Component Value Date   ALT 14 09/08/2020   AST  31 09/08/2020   ALKPHOS 71 09/08/2020   BILITOT 2.1 (H) 09/08/2020   IMPRESSION AND PLAN:  1) Chronic pain syndrome: osteoarthritis  multiple joints. Relatively recent L ankle fracture and ongoing acute pain related to this as well. Chronic upper abd discomfort. Discussed goals of opioid pain med treatment today---abd pain IS NOT what we are treating, and she understands this. Controlled substance contract reviewed with patient today.  Patient signed this and it will be placed in the chart.   UDS today. Plan at this time is to continue oxycodone 10m, 1 tid prn, #90 per month. No new oxycodone rx given today.  2) Alc+nonalc cirrhosis with portal HTN, chronic ascites, pancytopenia, hx of esoph varices, hx of recurrent hepatic encephalopathy. Cont iron, lasix and aldactone, now plan per GI to get q2 wk scheduled paracentesis (next one in 2d), cont f/u with hepatologist regarding possible transplant candidacy.   Has plan to see hematology for her pancytopenia. Labs drawn today by Dr. AHavery Moros cbc, cmet, afp, folate, vit b12 level.  3) Hypothyroidism: slight T4 dose increase due to mild TSH elevated 08/13/20. Plan for rpt TSH when I see her back for f/u in 118mo 4) Gait instability: ?neuropathy?.  Has neurologist initial evaluation set for next month. HH PT arrangements in the works. Currently wheelchair-bound for the most part. Strongly encouraged use of walker any time she does try to ambulate.  5) Left ankle pain: Recent small, nondisplaced cuneiform fracture noted on x-ray 08/13/20.  F/u x-ray 11/5 showed no fracture. Wet to dry dressing L ankle--, possible wound clinic referral in near future if delayed healing. Has appt with Dr. WhDurward Fortesn orthopedics for early next week.  6) L ankle lateral malleolus stage I ulceration---?secondary to friction from the cast she wore briefly on ankle.  This is stable and w/out sign of infection. Cont current wound care: wet to dry, change once  daily.  7) Chronic nausea + abd pain: seems at baseline. Zofran. Hx of gallstones but no sign of acute cholecystitis. Labs today by Dr. ArHavery Moros An After Visit Summary was printed and given to the patient.  FOLLOW UP: Return in about 4 weeks (around 10/15/2020) for f/u chronic pain, L ankle wound.  Signed:  PhCrissie SicklesMD           09/17/2020

## 2020-09-18 ENCOUNTER — Other Ambulatory Visit: Payer: Self-pay

## 2020-09-18 LAB — AFP TUMOR MARKER: AFP-Tumor Marker: 1.5 ng/mL

## 2020-09-18 NOTE — Patient Outreach (Signed)
[facility]) Care Management  09/18/2020  Megan Roberson 1956/04/02 [id number]   Telephone call to patient for follow up. No answer.  HIPAA compliant voice message left.  Plan: RN CM will wait return call, if no return call will outreach again within 4 business days.    Jone Baseman, RN, MSN [place] Management Care Management Coordinator Direct Line [phone] Cell [phone] Toll Free: [phone]  Fax: [phone]

## 2020-09-19 ENCOUNTER — Ambulatory Visit (HOSPITAL_COMMUNITY): Admission: RE | Admit: 2020-09-19 | Payer: Medicare HMO | Source: Ambulatory Visit

## 2020-09-19 ENCOUNTER — Telehealth: Payer: Self-pay | Admitting: Gastroenterology

## 2020-09-19 ENCOUNTER — Other Ambulatory Visit: Payer: Self-pay

## 2020-09-19 ENCOUNTER — Telehealth: Payer: Self-pay

## 2020-09-19 DIAGNOSIS — I8500 Esophageal varices without bleeding: Secondary | ICD-10-CM

## 2020-09-19 LAB — DRUG MONITORING, PANEL 8 WITH CONFIRMATION, URINE
6 Acetylmorphine: NEGATIVE ng/mL (ref <10)
Alcohol Metabolites: NEGATIVE ng/mL
Amphetamine: NEGATIVE ng/mL (ref <250)
Amphetamines: NEGATIVE ng/mL (ref <500)
Benzodiazepines: NEGATIVE ng/mL (ref <100)
Buprenorphine, Urine: NEGATIVE ng/mL (ref <5)
Cocaine Metabolite: NEGATIVE ng/mL (ref <150)
Codeine: NEGATIVE ng/mL (ref <50)
Creatinine: 171.1 mg/dL
Hydrocodone: NEGATIVE ng/mL (ref <50)
Hydromorphone: NEGATIVE ng/mL (ref <50)
MDMA: NEGATIVE ng/mL (ref <500)
Marijuana Metabolite: NEGATIVE ng/mL (ref <20)
Methamphetamine: NEGATIVE ng/mL (ref <250)
Morphine: NEGATIVE ng/mL (ref <50)
Norhydrocodone: NEGATIVE ng/mL (ref <50)
Noroxycodone: >10000 ng/mL — ABNORMAL HIGH (ref <50)
Opiates: NEGATIVE ng/mL (ref <100)
Oxidant: NEGATIVE ug/mL
Oxycodone: 4217 ng/mL — ABNORMAL HIGH (ref <50)
Oxycodone: POSITIVE ng/mL — AB (ref <100)
Oxymorphone: 2354 ng/mL — ABNORMAL HIGH (ref <50)
pH: 7.1 (ref 4.5–9.0)

## 2020-09-19 LAB — URINE CULTURE
MICRO NUMBER:: 11209882
Result:: NO GROWTH
SPECIMEN QUALITY:: ADEQUATE

## 2020-09-19 LAB — DM TEMPLATE

## 2020-09-19 NOTE — Telephone Encounter (Signed)
Spoke with patient, see 09/17/20 result note for more information

## 2020-09-19 NOTE — Telephone Encounter (Signed)
Spoke with patient and have scheduled an In-person Consult for 09/24/20 @  1 PM.  COVID screening was negative. One pet in home. Patient will put up before NP arrives. Patient lives with her husband.   Consent obtained; updated Outlook/Netsmart/Team List and Epic.

## 2020-09-20 ENCOUNTER — Other Ambulatory Visit: Payer: Self-pay

## 2020-09-20 NOTE — Patient Outreach (Addendum)
[facility]) Care Management  09/20/2020  Megan Roberson [birth year] [id number]   Telephone call to patient for follow up. No answer.  HIPAA compliant voice message left.  Plan: RN CM will attempt patient again within the next 3 weeks.  Jone Baseman, RN, MSN [place] Management Care Management Coordinator Direct Line [phone] Cell [phone] Toll Free: [phone]  Fax: [phone]

## 2020-09-21 ENCOUNTER — Other Ambulatory Visit (HOSPITAL_COMMUNITY): Payer: Medicare HMO

## 2020-09-23 ENCOUNTER — Other Ambulatory Visit: Payer: Self-pay

## 2020-09-23 ENCOUNTER — Ambulatory Visit (HOSPITAL_COMMUNITY)
Admission: RE | Admit: 2020-09-23 | Discharge: 2020-09-23 | Disposition: A | Discharge status: Home / Self Care | Payer: Medicare HMO | Source: Ambulatory Visit | Attending: Gastroenterology | Admitting: Gastroenterology

## 2020-09-23 ENCOUNTER — Ambulatory Visit: Payer: Self-pay

## 2020-09-23 ENCOUNTER — Ambulatory Visit (INDEPENDENT_AMBULATORY_CARE_PROVIDER_SITE_OTHER): Payer: Medicare HMO | Admitting: Orthopaedic Surgery

## 2020-09-23 VITALS — Ht 67.0 in | Wt 111.0 lb

## 2020-09-23 DIAGNOSIS — K7460 Unspecified cirrhosis of liver: Secondary | ICD-10-CM | POA: Diagnosis not present

## 2020-09-23 DIAGNOSIS — M25572 Pain in left ankle and joints of left foot: Secondary | ICD-10-CM

## 2020-09-23 DIAGNOSIS — S92002A Unspecified fracture of left calcaneus, initial encounter for closed fracture: Secondary | ICD-10-CM

## 2020-09-23 DIAGNOSIS — K7469 Other cirrhosis of liver: Secondary | ICD-10-CM | POA: Diagnosis not present

## 2020-09-23 DIAGNOSIS — R188 Other ascites: Secondary | ICD-10-CM | POA: Insufficient documentation

## 2020-09-23 HISTORY — PX: IR PARACENTESIS: IMG2679

## 2020-09-23 LAB — BODY FLUID CELL COUNT WITH DIFFERENTIAL
Lymphs, Fluid: 10 %
Monocyte-Macrophage-Serous Fluid: 79 % (ref 50–90)
Neutrophil Count, Fluid: 11 % (ref 0–25)
Total Nucleated Cell Count, Fluid: 47 cu mm (ref 0–1000)

## 2020-09-23 MED ORDER — LIDOCAINE HCL 1 % IJ SOLN
INTRAMUSCULAR | Status: AC
  Filled 2020-09-23: qty 20

## 2020-09-23 MED ORDER — LIDOCAINE HCL 1 % IJ SOLN
INTRAMUSCULAR | Status: DC | PRN
  Administered 2020-09-23: 20 mL via INTRADERMAL

## 2020-09-23 NOTE — Patient Outreach (Signed)
[facility]) Care Management  09/23/2020  Megan Roberson 1955-11-21 [id number]   Telephone call to patient for disease management follow up.   No answer.  HIPAA compliant voice message left.    Plan:  RN CM will close case.  Jone Baseman, RN, MSN [place] Management Care Management Coordinator Direct Line [phone] Cell [phone] Toll Free: [phone]  Fax: [phone]

## 2020-09-23 NOTE — Progress Notes (Signed)
Office Visit Note   Patient: Megan Roberson           Date of Birth: 1956-07-04           MRN: [id number] Visit Date: 09/23/2020              Requested by: Tammi Sou, MD [address] [place],  [place] [address] PCP: Tammi Sou, MD   Assessment & Plan: Visit Diagnoses:  1. Pain in left ankle and joints of left foot   2. Closed traumatic minimally displaced fracture of left calcaneus, initial encounter     Plan:  #1: At this time are going to make her nonweightbearing and we have used 1 of Dr. Jess Barters ankle bracing which is labs will with the no compression on the calcaneus. #2: We will see her back in 2 to 3 weeks for recheck evaluation.  Call in the interim if she has any more problems. #3: The family has been instructed in proper care.  Follow-Up Instructions: No follow-ups on file.   Orders:  Orders Placed This Encounter  Procedures  . XR Ankle Complete Left   No orders of the defined types were placed in this encounter.     Procedures: No procedures performed   Clinical Data: No additional findings.   Subjective: Chief Complaint  Patient presents with  . Left Ankle - Pain   HPI Patient presents today for her left ankle. She states that she had fallen a month ago and injured her ankle. She was admitted to the hospital for a week due to her liver disease. While there her ankle was xrayed and she was told that it was fractured. She was placed into a cast. Before leaving the hospital a week later, the doctor had her ankle xrayed again. She was then told it was not fractured, and the cast was removed. The cast rubbed an ulcer on the lateral side of her ankle, which is slowly healing. A week ago as she was walking to her bed she hit her ankle. She has been unable to bear weight since. Her pain is located along the posterior aspect of her ankle. She takes Oxycodone 32m and states that it does not help her pain at all.    Review of  Systems   Objective: Vital Signs: Ht 5' 7"  (1.702 m)   Wt 111 lb (50.3 kg)   BMI 17.39 kg/m   Physical Exam HENT:     Head: Normocephalic.     Mouth/Throat:     Mouth: Mucous membranes are moist.  Skin:    General: Skin is warm.  Neurological:     Mental Status: She is alert and oriented to person, place, and time.  Psychiatric:        Mood and Affect: Mood normal.        Behavior: Behavior normal.     Ortho Exam  Exam today reveals ecchymosis in the plantar aspect of the foot at the arch area.  She also has a ulcer on the lateral aspect of the ankle over the distal fibula.  This is more of a scab type area.  Markedly tender to palpation over the calcaneus and midfoot area.  The arch appears to be fixed in not really movable at this time.   Specialty Comments:  No specialty comments available.  Imaging: IR Paracentesis  Result Date: 09/23/2020 INDICATION: Patient with history of NASH cirrhosis and recurrent ascites. Request to IR for diagnostic and therapeutic  paracentesis EXAM: ULTRASOUND GUIDED  PARACENTESIS MEDICATIONS: 1% lidocaine 20 mL COMPLICATIONS: None immediate. PROCEDURE: Informed written consent was obtained from the patient after a discussion of the risks, benefits and alternatives to treatment. A timeout was performed prior to the initiation of the procedure. Initial ultrasound scanning demonstrates a large amount of ascites within the left lower abdominal quadrant. The left lower abdomen was prepped and draped in the usual sterile fashion. 1% lidocaine was used for local anesthesia. Following this, a 19 gauge, 7-cm, Yueh catheter was introduced. An ultrasound image was saved for documentation purposes. The paracentesis was performed. The catheter was removed and a dressing was applied. The patient tolerated the procedure well without immediate post procedural complication. Patient received post-procedure intravenous albumin; see nursing notes for details. FINDINGS: A  total of approximately 3 L of clear yellow fluid was removed. Samples were sent to the laboratory as requested by the clinical team. IMPRESSION: Successful ultrasound-guided paracentesis yielding 3 liters of peritoneal fluid. Read by: Soyla Dryer, NP Electronically Signed   By: Corrie Mckusick D.O.   On: 09/23/2020 11:29     PMFS History: Current Outpatient Medications  Medication Sig Dispense Refill  . diclofenac Sodium (VOLTAREN) 1 % GEL Apply 2 g topically daily as needed (for pain).     . feeding supplement (ENSURE ENLIVE / ENSURE PLUS) LIQD Take 237 mLs by mouth 2 (two) times daily between meals. 237 mL 12  . ferrous sulfate 325 (65 FE) MG EC tablet Take 1 tablet (325 mg total) by mouth in the morning and at bedtime. 180 tablet 1  . furosemide (LASIX) 40 MG tablet Take 1 tablet (40 mg total) by mouth 2 (two) times daily. 60 tablet 1  . lactulose (CHRONULAC) 10 GM/15ML solution Take 30 mLs (20 g total) by mouth 3 (three) times daily. 1892 mL 3  . levothyroxine (SYNTHROID, LEVOTHROID) 75 MCG tablet Take 1 tablet (75 mcg total) by mouth daily before breakfast. 30 tablet 0  . Multiple Vitamin (MULTIVITAMIN WITH MINERALS) TABS tablet Take 1 tablet by mouth daily.    Marland Kitchen omeprazole (PRILOSEC) 20 MG capsule Take 1 capsule (20 mg total) by mouth daily. 30 capsule 5  . ondansetron (ZOFRAN ODT) 4 MG disintegrating tablet Take 1 tablet (4 mg total) by mouth every 8 (eight) hours as needed for nausea or vomiting. 40 tablet 3  . Oxycodone HCl 10 MG TABS 1 tab po tid prn pain 90 tablet 0  . potassium chloride SA (KLOR-CON) 20 MEQ tablet Take 2 tablets (40 mEq total) by mouth daily. 30 tablet 2  . rifaximin (XIFAXAN) 550 MG TABS tablet Take 1 tablet (550 mg total) by mouth 2 (two) times daily. Pt states she take once daily 60 tablet 1  . sertraline (ZOLOFT) 100 MG tablet Take 100 mg by mouth daily.    . sertraline (ZOLOFT) 50 MG tablet Take 1 tablet (50 mg total) by mouth daily. 4 tablet 0  .  spironolactone (ALDACTONE) 100 MG tablet Take 2 tablets (200 mg total) by mouth daily. 30 tablet 2  . traZODone (DESYREL) 50 MG tablet Take 1 tablet (50 mg total) by mouth at bedtime as needed for sleep. 4 tablet 0   No current facility-administered medications for this visit.   Facility-Administered Medications Ordered in Other Visits  Medication Dose Route Frequency Provider Last Rate Last Admin  . lidocaine (XYLOCAINE) 1 % (with pres) injection           . lidocaine (XYLOCAINE) 1 % (with  pres) injection    PRN Soyla Dryer R, NP   20 mL at 09/23/20 1041    Patient Active Problem List   Diagnosis Date Noted  . Closed traumatic minimally displaced fracture of left calcaneus 09/23/2020  . Hydrothorax   . Ascites 09/03/2020  . Closed left ankle fracture 09/03/2020  . Mood disorder ([place]) 08/17/2020  . Right lower lobe pneumonia 08/16/2020  . Asymptomatic bacteriuria   . Hypothyroidism 08/14/2020  . Displaced fracture of medial cuneiform of left foot, initial encounter for closed fracture 08/14/2020  . Failure to thrive in adult 08/14/2020  . Weight loss 08/14/2020  . UTI (urinary tract infection) 08/14/2020  . Fall at home, initial encounter 08/14/2020  . Generalized weakness 08/13/2020  . Pain in right knee 06/04/2020  . Pain in left hip 06/04/2020  . Nausea 05/19/2020  . Prolonged QT interval 05/19/2020  . Elevated brain natriuretic peptide (BNP) level 05/19/2020  . Hypoalbuminemia 05/19/2020  . Cough 05/19/2020  . Atelectasis 05/19/2020  . Hypomagnesemia 05/19/2020  . Cysts of both ovaries 06/15/2019  . Lumbar pain 02/27/2019  . Closed fracture of left tibial plateau 05/17/18 07/08/2018  . Esophageal varices without bleeding ([place])   . Osteoporosis 02/09/2018  . Goals of care, counseling/discussion 01/18/2018  . Refractory ascites 11/25/2017  . Cellulitis, abdominal wall 11/25/2017  . SOB (shortness of breath) 11/25/2017  . Protein-calorie malnutrition, severe ([place])  11/06/2017  . Ogilvie's syndrome   . Abdominal distension   . Abdominal pain   . Liver failure without hepatic coma ([place])   . Thrombocytopenia ([place])   . Palliative care by specialist   . Closed fracture of lumbar spine without lesion of spinal cord ([place])   . Recurrent falls 06/04/2017  . 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 Cirrhosis of liver with ascites ([place]) 04/21/2017  . Pancytopenia ([place]) 04/21/2017  . History of open reduction and internal fixation (ORIF) procedure 04/21/2017  . Visit for suture removal 04/21/2017  . Left Olecranon fracture 04/21/2017  . Hypokalemia 04/21/2017   Past Medical History:  Diagnosis Date  . Anxiety and depression   . Cholelithiasis without cholecystitis   . Chronic abdominal pain   . Chronic nausea    + abd pain  . Cirrhosis of liver with ascites (HCC)    Alc cirrhosis; hx of multiple abd paracentesis, +esoph varices (+banding), pancytopenia, hx of hepatic enceph, hx of hydrothorax  . Heart murmur    Echo 10/06/17 [facility]): Normal LV/RV size, basal septal hypertophy-normal variant, LVEF 65-70%, normal LV/RV function, est RAP 5 mmHg, no sign valvular stenosis or regurg--trace MR/TR/PR  . History of abnormal cervical Pap smear   . History of pneumonia   . Hypothyroidism   . Insomnia   . Nephrolithiasis    right renal pelvis, 13-58m nonobst stone  . Neuropathy    unclear hx  . Osteoarthritis, multiple sites    primarily hands and knees  . Osteoporosis 02/09/2018  . Ovarian cyst    bilat.  Stable on MRI pelvis 11/2019 and CT abd/pelv 09/2020--no f/u imaging indicated.  . [place]ancytopenia ([place]   . Splenomegaly   . [place]ocyt[place] ([place] 12/01/2019   severe, hx of plts down into 325s   Family History  Adopted: Yes  Problem Relation Age of Onset  . Alcohol abuse Daughter     Past Surgical History:  Procedure Laterality Date  . AUGMENTATION MAMMAPLASTY Bilateral    20 years ago  . BIOPSY  03/15/2018   gastric.  Procedure: BIOPSY;  Surgeon: Yetta Flock, MD;  Location: [facility];  Service: Gastroenterology;;  Gastric  . COLONOSCOPY WITH ESOPHAGOGASTRODUODENOSCOPY (EGD) AND ESOPHAGEAL DILATION (ED)    . COLONOSCOPY WITH PROPOFOL N/A 03/15/2018   Redundant colon, severe looping, very edematous colon c/w portal HTN.  Procedure: COLONOSCOPY WITH PROPOFOL;  Surgeon: Yetta Flock, MD;  Location: [facility];  Service: Gastroenterology;  Laterality: N/A;  . ESOPHAGOGASTRODUODENOSCOPY (EGD) WITH PROPOFOL N/A 03/15/2018   Gastritis, small gastric ulcer. Procedure: ESOPHAGOGASTRODUODENOSCOPY (EGD) WITH PROPOFOL;  Surgeon: Yetta Flock, MD;  Location: [facility];  Service: Gastroenterology;  Laterality: N/A;  . ESOPHAGOGASTRODUODENOSCOPY W/ BANDING     Varices  . FRACTURE SURGERY Left    x 2 L elbow and hip left   . HARDWARE REMOVAL Left 09/17/2017   Procedure: HARDWARE REMOVAL LEFT OLECRANON;  Surgeon: Altamese Jemison, MD;  Location: [place];  Service: Orthopedics;  Laterality: Left;  . IR PARACENTESIS  11/18/2017  . IR PARACENTESIS  11/26/2017  . IR PARACENTESIS  03/14/2018  . IR PARACENTESIS  08/08/2020  . IR PARACENTESIS  09/23/2020  . KNEE SURGERY Right    open incision for ligaments  . ORIF ELBOW FRACTURE Left 04/30/2017   Procedure: REVISION OPEN REDUCTION INTERNAL FIXATION (ORIF) ELBOW/OLECRANON FRACTURE;  Surgeon: Altamese Frontenac, MD;  Location: [place];  Service: Orthopedics;  Laterality: Left;  . ORIF HIP FRACTURE Left   . REPAIR EXTENSOR TENDON Left 12/29/2019   Procedure: LEFT LONG AND LEFT RING FRINGER EXTENSOR REALIGNAMENT WITH POSSIBLE TENDON TRANSFER;  Surgeon: Charlotte Crumb, MD;  Location: [facility];  Service: Orthopedics;  Laterality: Left;  . virtual colonoscopy  04/24/2019   no polyps, no mass, no apple core lesion, no stricture   Social History   Occupational History  . Occupation: disabled  Tobacco Use  . Smoking status: Never Smoker  . Smokeless tobacco: Never Used  Vaping Use  .  Vaping Use: Never used  Substance and Sexual Activity  . Alcohol use: No  . Drug use: No  . Sexual activity: Not Currently    Birth control/protection: Post-menopausal

## 2020-09-23 NOTE — Procedures (Signed)
PROCEDURE SUMMARY:  Successful US guided paracentesis from left abdomen.  Yielded 3L of clear yellow fluid.  No immediate complications.  Pt tolerated well.   Specimen sent for labs.  EBL < 2 mL  Theresa Duty, NP 09/23/2020 11:30 AM

## 2020-09-24 ENCOUNTER — Other Ambulatory Visit: Payer: Medicare HMO | Admitting: Nurse Practitioner

## 2020-09-24 ENCOUNTER — Telehealth: Payer: Self-pay

## 2020-09-24 ENCOUNTER — Ambulatory Visit: Payer: Medicare HMO

## 2020-09-24 LAB — PATHOLOGIST SMEAR REVIEW: Path Review: 11232021

## 2020-09-24 NOTE — Telephone Encounter (Signed)
Patient called to reschedule Palliative care consult appointment that was scheduled for today. Rescheduled for 10/04/20 11 AM

## 2020-09-25 ENCOUNTER — Encounter (HOSPITAL_COMMUNITY): Payer: Self-pay | Admitting: Anesthesiology

## 2020-10-01 ENCOUNTER — Ambulatory Visit: Payer: Medicare HMO

## 2020-10-01 ENCOUNTER — Encounter: Payer: Medicare HMO | Admitting: Family Medicine

## 2020-10-01 ENCOUNTER — Telehealth: Payer: Medicare HMO | Admitting: Family Medicine

## 2020-10-01 NOTE — Progress Notes (Deleted)
OFFICE VISIT  10/01/2020  CC: No chief complaint on file.   HPI:    Patient is a 64 y.o. Caucasian female who presents for diarrhea, nausea, body aches.  A/P as of last visit (2 weeks ago): "1) Chronic pain syndrome: osteoarthritis multiple joints. Relatively recent L ankle fracture and ongoing acute pain related to this as well. Chronic upper abd discomfort. Discussed goals of opioid pain med treatment today---abd pain IS NOT what we are treating, and she understands this. Controlled substance contract reviewed with patient today.  Patient signed this and it will be placed in the chart.   UDS today. Plan at this time is to continue oxycodone 55m, 1 tid prn, #90 per month. No new oxycodone rx given today.  2) Alc+nonalc cirrhosis with portal HTN, chronic ascites, pancytopenia, hx of esoph varices, hx of recurrent hepatic encephalopathy. Cont iron, lasix and aldactone, now plan per GI to get q2 wk scheduled paracentesis (next one in 2d), cont f/u with hepatologist regarding possible transplant candidacy.   Has plan to see hematology for her pancytopenia. Labs drawn today by Dr. AHavery Moros cbc, cmet, afp, folate, vit b12 level.  3) Hypothyroidism: slight T4 dose increase due to mild TSH elevated 08/13/20. Plan for rpt TSH when I see her back for f/u in 113mo 4) Gait instability: ?neuropathy?.  Has neurologist initial evaluation set for next month. HH PT arrangements in the works. Currently wheelchair-bound for the most part. Strongly encouraged use of walker any time she does try to ambulate.  5) Left ankle pain: Recent small, nondisplaced cuneiform fracture noted on x-ray 08/13/20. F/u x-ray 11/5 showed no fracture. Wet to dry dressing L ankle--, possible wound clinic referral in near future if delayed healing. Has appt with Dr. WhDurward Fortesn orthopedics for early next week.  6) L ankle lateral malleolus stage I ulceration---?secondary to friction from the cast she wore  briefly on ankle.  This is stable and w/out sign of infection. Cont current wound care: wet to dry, change once daily.  7) Chronic nausea + abd pain: seems at baseline. Zofran. Hx of gallstones but no sign of acute cholecystitis. Labs today by Dr. ArHavery Moros  INTERIM HX: Saw ortho 11/22-->new ankle cast, non-wt bearing ->for tx of L calcaneus fx. She is scheduled for EGD 12/17 for esoph banding. Paracentesis was done 09/23/20.  ***    Past Medical History:  Diagnosis Date  . Anxiety and depression   . Cholelithiasis without cholecystitis   . Chronic abdominal pain   . Chronic nausea    + abd pain  . Cirrhosis of liver with ascites (HCC)    Alc cirrhosis; hx of multiple abd paracentesis, +esoph varices (+banding), pancytopenia, hx of hepatic enceph, hx of hydrothorax  . Heart murmur    [facility], basal septal hypertophy-normal variant, LVEF 65-70%, normal LV/RV function, est RAP 5 mmHg, no sign valvular stenosis or regurg--trace MR/TR/PR  . History of abnormal cervical Pap smear   . History of pneumonia   . Hypothyroidism   . Insomnia   . Nephrolithiasis    right renal pelvis, 13-1424monobst stone  . Neuropathy    unclear hx  . Osteoarthritis, multiple sites    primarily hands and knees  . Osteoporosis 02/09/2018  . Ovarian cyst    bilat.  Stable on MRI pelvis 11/2019 and CT abd/pelv 09/2020--no f/u imaging indicated.  . Pancytopenia ([place] . Splenomegaly   . Thrombocytopenia ([place]/2021   severe, hx  of plts down into 30s    Past Surgical History:  Procedure Laterality Date  . AUGMENTATION MAMMAPLASTY Bilateral    20 years ago  . BIOPSY  03/15/2018   gastric.  Procedure: BIOPSY;  Surgeon: Yetta Flock, MD;  Location: [facility];  Service: Gastroenterology;;  Gastric  . COLONOSCOPY WITH ESOPHAGOGASTRODUODENOSCOPY (EGD) AND ESOPHAGEAL DILATION (ED)    . COLONOSCOPY WITH PROPOFOL N/A 03/15/2018   Redundant colon, severe  looping, very edematous colon c/w portal HTN.  Procedure: COLONOSCOPY WITH PROPOFOL;  Surgeon: Yetta Flock, MD;  [facility];  Service: Gastroenterology;  Laterality: N/A;  . ESOPHAGOGASTRODUODENOSCOPY (EGD) WITH PROPOFOL N/A 03/15/2018   Gastritis, small gastric ulcer. Procedure: ESOPHAGOGASTRODUODENOSCOPY (EGD) WITH PROPOFOL;  Surgeon: Yetta Flock, MD;  [facility];  Service: Gastroenterology;  Laterality: N/A;  . ESOPHAGOGASTRODUODENOSCOPY W/ BANDING     Varices  . FRACTURE SURGERY Left    x 2 L elbow and hip left   . HARDWARE REMOVAL Left 09/17/2017   Procedure: HARDWARE REMOVAL LEFT OLECRANON;  Surgeon: Altamese Gerrard, MD;  Location: [place];  Service: Orthopedics;  Laterality: Left;  . IR PARACENTESIS  11/18/2017  . IR PARACENTESIS  11/26/2017  . IR PARACENTESIS  03/14/2018  . IR PARACENTESIS  08/08/2020  . IR PARACENTESIS  09/23/2020  . KNEE SURGERY Right    open incision for ligaments  . ORIF ELBOW FRACTURE Left 04/30/2017   Procedure: REVISION OPEN REDUCTION INTERNAL FIXATION (ORIF) ELBOW/OLECRANON FRACTURE;  Surgeon: Altamese Thornhill, MD;  Location: [place];  Service: Orthopedics;  Laterality: Left;  . ORIF HIP FRACTURE Left   . REPAIR EXTENSOR TENDON Left 12/29/2019   Procedure: LEFT LONG AND LEFT RING FRINGER EXTENSOR REALIGNAMENT WITH POSSIBLE TENDON TRANSFER;  Surgeon: Charlotte Crumb, MD;  Location: [facility];  Service: Orthopedics;  Laterality: Left;  . virtual colonoscopy  04/24/2019   no polyps, no mass, no apple core lesion, no stricture    Outpatient Medications Prior to Visit  Medication Sig Dispense Refill  . diclofenac Sodium (VOLTAREN) 1 % GEL Apply 2 g topically daily as needed (for pain).     . feeding supplement (ENSURE ENLIVE / ENSURE PLUS) LIQD Take 237 mLs by mouth 2 (two) times daily between meals. 237 mL 12  . ferrous sulfate 325 (65 FE) MG EC tablet Take 1 tablet (325 mg total) by mouth in the morning and at bedtime. 180 tablet 1   . furosemide (LASIX) 40 MG tablet Take 1 tablet (40 mg total) by mouth 2 (two) times daily. 60 tablet 1  . lactulose (CHRONULAC) 10 GM/15ML solution Take 30 mLs (20 g total) by mouth 3 (three) times daily. 1892 mL 3  . levothyroxine (SYNTHROID, LEVOTHROID) 75 MCG tablet Take 1 tablet (75 mcg total) by mouth daily before breakfast. 30 tablet 0  . Multiple Vitamin (MULTIVITAMIN WITH MINERALS) TABS tablet Take 1 tablet by mouth daily.    Marland Kitchen omeprazole (PRILOSEC) 20 MG capsule Take 1 capsule (20 mg total) by mouth daily. 30 capsule 5  . ondansetron (ZOFRAN ODT) 4 MG disintegrating tablet Take 1 tablet (4 mg total) by mouth every 8 (eight) hours as needed for nausea or vomiting. 40 tablet 3  . Oxycodone HCl 10 MG TABS 1 tab po tid prn pain 90 tablet 0  . potassium chloride SA (KLOR-CON) 20 MEQ tablet Take 2 tablets (40 mEq total) by mouth daily. 30 tablet 2  . rifaximin (XIFAXAN) 550 MG TABS tablet Take 1 tablet (550 mg total) by  mouth 2 (two) times daily. Pt states she take once daily 60 tablet 1  . sertraline (ZOLOFT) 100 MG tablet Take 100 mg by mouth daily.    . sertraline (ZOLOFT) 50 MG tablet Take 1 tablet (50 mg total) by mouth daily. 4 tablet 0  . spironolactone (ALDACTONE) 100 MG tablet Take 2 tablets (200 mg total) by mouth daily. 30 tablet 2  . traZODone (DESYREL) 50 MG tablet Take 1 tablet (50 mg total) by mouth at bedtime as needed for sleep. 4 tablet 0   No facility-administered medications prior to visit.    Allergies  Allergen Reactions  . Penicillins Swelling and Other (See Comments)    FACIAL SWELLING Did it involve swelling of the face/tongue/throat, SOB, or low BP? Yes Did it involve sudden or severe rash/hives, skin peeling, or any reaction on the inside of your mouth or nose? No Did you need to seek medical attention at a hospital or doctor's office? Unknown When did it last happen?30 years If all above answers are "NO", may proceed with cephalosporin use.   .  Tylenol [Acetaminophen] Nausea Only  . Sulfa Antibiotics Itching and Rash  . Tramadol Swelling and Rash    ROS As per HPI  PE: Vitals with BMI 09/23/2020 09/17/2020 09/17/2020  Height 5' 7"  5' 7"  5' 7"   Weight 111 lbs 111 lbs 115 lbs  BMI 17.38 06.23 76.28  Systolic 315 176 160  Diastolic 83 73 60  Pulse - 85 79     ***  LABS:  ***  IMPRESSION AND PLAN:  No problem-specific Assessment & Plan notes found for this encounter.   An After Visit Summary was printed and given to the patient.  FOLLOW UP: No follow-ups on file.  @esig @

## 2020-10-02 ENCOUNTER — Ambulatory Visit: Payer: Self-pay | Admitting: Gastroenterology

## 2020-10-04 ENCOUNTER — Other Ambulatory Visit: Payer: Self-pay

## 2020-10-04 ENCOUNTER — Other Ambulatory Visit: Payer: Medicare HMO | Admitting: Nurse Practitioner

## 2020-10-04 DIAGNOSIS — R188 Other ascites: Secondary | ICD-10-CM

## 2020-10-04 DIAGNOSIS — K7460 Unspecified cirrhosis of liver: Secondary | ICD-10-CM

## 2020-10-04 DIAGNOSIS — Z515 Encounter for palliative care: Secondary | ICD-10-CM

## 2020-10-04 NOTE — Progress Notes (Signed)
[facility] Consult Note Telephone: [phone]  Fax: [phone]  PATIENT NAME: Megan Roberson [place] [address] [phone] (home)  DOB: 1955/11/09 MRN: [id number]  PRIMARY CARE PROVIDER:    Tammi Sou, MD,  [address] [place] [address] [phone]  REFERRING PROVIDER:   Tammi Sou, MD [address] [place],  [place] [address] [phone]  RESPONSIBLE PARTY:   Extended Emergency Contact Information Primary Emergency Contact: Megan Roberson,Megan Roberson Address: [place], [place] [address] Megan Roberson of [place] Phone: [phone] Relation: Spouse Secondary Emergency Contact: Megan Roberson Mobile Phone: [phone] Relation: Sister  I met face to face with patient in home.   ASSESSMENT AND RECOMMENDATIONS:   1. Advance Care Planning: Today's visit consisted of building trust and discussions on palliative care medicine as specialized medical care for people living with serious illness, aimed at facilitating improved quality of life through symptoms relief, assisting with advance care planning and establishing goals of care. Patient expressed appreciation for education provided on palliative care and how it differs from Hospice service. Palliative care will continue to provide support to patient, family and the medical team. Goal of care: Patient's goal of care is function. Patient desire to improve her function, she want to be able to do the things she enjoys doing such as cooking and taking care of her home. Directives: Patient verbalized desire to be resuscitated in the event of cardiac or respiratory arrest. She wants to defer the decision to remain or take off ventilator support to her husband. She report that she had made similar decision for a family member in the past who later on survived and doing well now. The need to complete a MOST form was  discussed, patient want to discuss it first with her husband. Blank MOST form left with patient, we will discuss at next visit or when patient is ready. Patient husband at home but not present during visit to day.  2. Symptom Management:  Patient report 7/10 pain today. She is on Oxycodone 81m TID, patient report only taking it once or twice a day. Encouraged to take medication as prescribed especially if her pain is 7/10. Patient agreed to take pain medication as prescribed.  Ascitics is ongoing, patient report abdominal fullness resulting in poor oral intake. She is on scheduled bi weekly paracentesis, report feeling better after paracentesis, last paracentesis on 09/23/2020.  Patient on Lactulose TID but report only taking it once a day, she repot taking more if she feels like she needs to especially if she feels like she is loosing her mind. Patient report frequent nausea which is controlled on Zofran. We discussed nutrition and its importance in chronic disease management. Suggested patient make calorie dense smothies to supplement her meal intake, patient report she already takes fruit smoothies which she adds Ensure to, advised patient to add nuts and other protein rich food to it as tolerated. Patient is non weight bearing to left leg due to ankle fracture, report she has been screened for PT but has not been physically seen by PT. Patient voiced no other concerns today. Provided general support and encouragement, no other unmet needs identified.  3. Follow up Palliative Care Visit: Palliative care will continue to follow for goals of care clarification and symptom management. Return in about 4-6 weeks or prn.  4. Family /Caregiver/Community Supports: Patient report moving from  [place] three years ago. She is married, has 2 adult children. Her husband is her main caregiver.  5. Cognitive / Functional decline: Patient awake, alert and coherent, able to make her own decisions. She requires  moderate assist with her ADLS, needs assistance getting in and out of shower, continent of bowel and bladder. Ambulates with assistive device, not weight bearing to left left leg at this time due to ankle fracture.   I spent 60 minutes providing this consultation, time includes time spent with patient, chart review, provider coordination, and documentation. More than 50% of the time in this consultation was spent coordinating communication.   CHIEF COMPLAINT: Initial palliative care consult  HISTORY OF PRESENT ILLNESS:  Megan Roberson is a 64 y.o. 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 Followed by Dr. Zollie Roberson of the Hepatology transplant clinic in [place]. Patient report getting paracentesis every 2 weeks, last paracentesis was on 09/23/2020 yielded about 3L of fluid. Patient has left ankle fracture from a fal, x-ray completed on 08/13/2020 showed small nondisplaced cuneiform fracture. She report re-injuring the ankle when she hit it while walking to her bed, she is currently non weight bearing to the leg. Palliative Care was asked to follow this patient by consultation request of Megan Roberson, Megan Blackwater, MD to help address advance care planning and goals of care. Palliative Care was asked to help address goals of care.   CODE STATUS: Full Code  PPS: 40%  HOSPICE ELIGIBILITY/DIAGNOSIS: TBD  PHYSICAL EXAM / ROS:   Current and past weights: 111lbs down from 125lb at base line. Ht 11f7", BMI 17.39kg/m2 on 09/23/2020 General: chronically ill and frail appearing, thin, sitting in recliner in her living room in NAD Cardiovascular: denied chest pain, no lower extremity edema  Pulmonary: no cough, no increased SOB, room air GI:  appetite poor, denied constipation, continent of bowel, abdomen distended, +ve bowel sounds GU: denies dysuria,  continent of urine MSK:  Left ankle in a brace, ambulatory Skin: scattered ecchymotic areas in bilateral arms and legs Neurological: weakness, but otherwise nonfocal  PAST MEDICAL HISTORY:  Past Medical History:  Diagnosis Date  . Anxiety and depression   . Cholelithiasis without cholecystitis   . Chronic abdominal pain   . Chronic nausea    + abd pain  . Cirrhosis of liver with ascites (HCC)    Alc cirrhosis; hx of multiple abd paracentesis, +esoph varices (+banding), pancytopenia, hx of hepatic enceph, hx of hydrothorax  . Heart murmur    Echo 10/06/17 ([facility]: Normal LV/RV size, basal septal hypertophy-normal variant, LVEF 65-70%, normal LV/RV function, est RAP 5 mmHg, no sign valvular stenosis or regurg--trace MR/TR/PR  . History of abnormal cervical Pap smear   . History of pneumonia   . Hypothyroidism   . Insomnia   . Nephrolithiasis    right renal pelvis, 13-14mnonobst stone  . Neuropathy    unclear hx  . Osteoarthritis, multiple sites    primarily hands and knees  . Osteoporosis 02/09/2018  . Ovarian cyst    bilat.  Stable on MRI pelvis 11/2019 and CT abd/pelv 09/2020--no f/u imaging indicated.  . Pancytopenia ([facility]  . Splenomegaly   . Thrombocytopenia ([facility]01/29/2021   severe, hx of plts down into 30s    SOCIAL HX:  Social History   Tobacco Use  . Smoking status: Never Smoker  . Smokeless tobacco: Never Used  Substance Use Topics  . Alcohol use: No   FAMILY HX:  Family History  Adopted: Yes  Problem Relation Age of Onset  . Alcohol abuse Daughter     ALLERGIES:  Allergies  Allergen Reactions  . Penicillins Swelling and Other (See Comments)    FACIAL SWELLING Did it involve swelling of the face/tongue/throat, SOB, or low BP? Yes Did it involve sudden or severe rash/hives, skin peeling, or any reaction on the inside of your mouth or nose? No Did you need to seek medical attention at a hospital or doctor's office? Unknown When did it last  happen?30 years If all above answers are "NO", may proceed with cephalosporin use.   . Tylenol [Acetaminophen] Nausea Only  . Sulfa Antibiotics Itching and Rash  . Tramadol Swelling and Rash     PERTINENT MEDICATIONS:  Outpatient Encounter Medications as of 10/04/2020  Medication Sig  . diclofenac Sodium (VOLTAREN) 1 % GEL Apply 2 g topically daily as needed (for pain).   . feeding supplement (ENSURE ENLIVE / ENSURE PLUS) LIQD Take 237 mLs by mouth 2 (two) times daily between meals.  . ferrous sulfate 325 (65 FE) MG EC tablet Take 1 tablet (325 mg total) by mouth in the morning and at bedtime.  . furosemide (LASIX) 40 MG tablet Take 1 tablet (40 mg total) by mouth 2 (two) times daily.  Marland Kitchen lactulose (CHRONULAC) 10 GM/15ML solution Take 30 mLs (20 g total) by mouth 3 (three) times daily.  Marland Kitchen levothyroxine (SYNTHROID, LEVOTHROID) 75 MCG tablet Take 1 tablet (75 mcg total) by mouth daily before breakfast.  . Multiple Vitamin (MULTIVITAMIN WITH MINERALS) TABS tablet Take 1 tablet by mouth daily.  Marland Kitchen omeprazole (PRILOSEC) 20 MG capsule Take 1 capsule (20 mg total) by mouth daily.  . ondansetron (ZOFRAN ODT) 4 MG disintegrating tablet Take 1 tablet (4 mg total) by mouth every 8 (eight) hours as needed for nausea or vomiting.  . Oxycodone HCl 10 MG TABS 1 tab po tid prn pain  . potassium chloride SA (KLOR-CON) 20 MEQ tablet Take 2 tablets (40 mEq total) by mouth daily.  . rifaximin (XIFAXAN) 550 MG TABS tablet Take 1 tablet (550 mg total) by mouth 2 (two) times daily. Pt states she take once daily  . sertraline (ZOLOFT) 100 MG tablet Take 100 mg by mouth daily.  . sertraline (ZOLOFT) 50 MG tablet Take 1 tablet (50 mg total) by mouth daily.  Marland Kitchen spironolactone (ALDACTONE) 100 MG tablet Take 2 tablets (200 mg total) by mouth daily.  . traZODone (DESYREL) 50 MG tablet Take 1 tablet (50 mg total) by mouth at bedtime as needed for sleep.   No facility-administered encounter medications on file as of  10/04/2020.     Alba Destine, NP , DNP, AGPCNP-BC

## 2020-10-08 ENCOUNTER — Ambulatory Visit (HOSPITAL_COMMUNITY): Payer: Medicare HMO | Admitting: Hematology and Oncology

## 2020-10-09 ENCOUNTER — Ambulatory Visit (HOSPITAL_COMMUNITY): Payer: Medicare HMO

## 2020-10-14 ENCOUNTER — Inpatient Hospital Stay (HOSPITAL_COMMUNITY): Admission: RE | Admit: 2020-10-14 | Payer: Medicare HMO | Source: Ambulatory Visit

## 2020-10-15 ENCOUNTER — Ambulatory Visit: Payer: Medicare HMO | Admitting: Orthopaedic Surgery

## 2020-10-15 ENCOUNTER — Other Ambulatory Visit: Payer: Self-pay

## 2020-10-15 NOTE — Telephone Encounter (Signed)
Requesting: oxycodone Contract:09/17/20 UDS:n/a Last Visit:09/17/20 Next Visit:11/04/20 Last Refill: 09/09/20(90,0)  RF request for trazodone LOV:09/17/20 Next ov: 11/04/20  Last written: n/a   Medications pending. Please advise, thanks. Appt r/s to 11/04/20 due to possible exposure.

## 2020-10-16 ENCOUNTER — Ambulatory Visit: Payer: Medicare HMO | Admitting: Family Medicine

## 2020-10-16 MED ORDER — TRAZODONE HCL 50 MG PO TABS: 50.0000 mg | ORAL_TABLET | Freq: Every evening | ORAL | 6 refills | Status: DC | PRN

## 2020-10-16 MED ORDER — OXYCODONE HCL 10 MG PO TABS: ORAL_TABLET | ORAL | 0 refills | Status: DC

## 2020-10-16 NOTE — Progress Notes (Deleted)
NEUROLOGY CONSULTATION NOTE  Megan Roberson MRN: [id number] DOB: 11-26-1955  Referring provider: [place] Cellar, MD Primary care provider: Shawnie Dapper, MD  Reason for consult:  Falls, balance disorder   Subjective:  Megan Roberson is a 64 year old ***-handed female with cirrhosis of the liver and hepatic encephalopathy due to history of alcoholism, hypothyroidism and osteoporosis who presents for balance disorder with frequent falls.  History supplemented by hospital records and referring provider's notes.  She has history of alcoholism with decompensated cirrhosis with ascites, hepatic encephalopathy and thrombocytopenia. ***.  She is frail with osteoporosis and has sustained multiple fractures.  Due to recurrent falls, she has had multiple imaging of the head.  Most recent head CT from 08/13/2020, personally reviewed, was unremarkable.  She has a history of noncompliance to lactulose.  Over the past year, ammonia levels largely remained between 30 and 54, but was 131 in October.  B1 was 80.8 and TSH 6.506.  B12 and folate levels in November were 990 and 22.2 respectively.       PAST MEDICAL HISTORY: Past Medical History:  Diagnosis Date  . Anxiety and depression   . Cholelithiasis without cholecystitis   . Chronic abdominal pain   . Chronic nausea    + abd pain  . Cirrhosis of liver with ascites (HCC)    Alc cirrhosis; hx of multiple abd paracentesis, +esoph varices (+banding), pancytopenia, hx of hepatic enceph, hx of hydrothorax  . Heart murmur    Echo 10/06/17 [facility]): Normal LV/RV size, basal septal hypertophy-normal variant, LVEF 65-70%, normal LV/RV function, est RAP 5 mmHg, no sign valvular stenosis or regurg--trace MR/TR/PR  . History of abnormal cervical Pap smear   . History of pneumonia   . Hypothyroidism   . Insomnia   . Nephrolithiasis    right renal pelvis, 13-34m nonobst stone  . Neuropathy    unclear hx  . Osteoarthritis, multiple sites    primarily  hands and knees  . Osteoporosis 02/09/2018  . Ovarian cyst    bilat.  Stable on MRI pelvis 11/2019 and CT abd/pelv 09/2020--no f/u imaging indicated.  . Pancytopenia ([facility]   . Splenomegaly   . Thrombocytopenia ([facility] 12/01/2019   severe, hx of plts down into 30s    PAST SURGICAL HISTORY: Past Surgical History:  Procedure Laterality Date  . AUGMENTATION MAMMAPLASTY Bilateral    20 years ago  . BIOPSY  03/15/2018   gastric.  Procedure: BIOPSY;  Surgeon: AYetta Flock MD;  [facility];  Service: Gastroenterology;;  Gastric  . COLONOSCOPY WITH ESOPHAGOGASTRODUODENOSCOPY (EGD) AND ESOPHAGEAL DILATION (ED)    . COLONOSCOPY WITH PROPOFOL N/A 03/15/2018   Redundant colon, severe looping, very edematous colon c/w portal HTN.  Procedure: COLONOSCOPY WITH PROPOFOL;  Surgeon: AYetta Flock MD;  [facility];  Service: Gastroenterology;  Laterality: N/A;  . ESOPHAGOGASTRODUODENOSCOPY (EGD) WITH PROPOFOL N/A 03/15/2018   Gastritis, small gastric ulcer. Procedure: ESOPHAGOGASTRODUODENOSCOPY (EGD) WITH PROPOFOL;  Surgeon: AYetta Flock MD;  [facility];  Service: Gastroenterology;  Laterality: N/A;  . ESOPHAGOGASTRODUODENOSCOPY W/ BANDING     Varices  . FRACTURE SURGERY Left    x 2 L elbow and hip left   . HARDWARE REMOVAL Left 09/17/2017   Procedure: HARDWARE REMOVAL LEFT OLECRANON;  Surgeon: HAltamese Swaledale MD;  [facility]  Service: Orthopedics;  Laterality: Left;  . IR PARACENTESIS  11/18/2017  . IR PARACENTESIS  11/26/2017  . IR PARACENTESIS  03/14/2018  . IR PARACENTESIS  08/08/2020  .  IR PARACENTESIS  09/23/2020  . KNEE SURGERY Right    open incision for ligaments  . ORIF ELBOW FRACTURE Left 04/30/2017   Procedure: REVISION OPEN REDUCTION INTERNAL FIXATION (ORIF) ELBOW/OLECRANON FRACTURE;  Surgeon: Altamese [place], MD;  Location: [facility];  Service: Orthopedics;  Laterality: Left;  . ORIF HIP FRACTURE Left   . REPAIR EXTENSOR TENDON Left  12/29/2019   Procedure: LEFT LONG AND LEFT RING FRINGER EXTENSOR REALIGNAMENT WITH POSSIBLE TENDON TRANSFER;  Surgeon: Charlotte Crumb, MD;  Location: [facility];  Service: Orthopedics;  Laterality: Left;  . virtual colonoscopy  04/24/2019   no polyps, no mass, no apple core lesion, no stricture    MEDICATIONS: Current Outpatient Medications on File Prior to Visit  Medication Sig Dispense Refill  . diclofenac Sodium (VOLTAREN) 1 % GEL Apply 2 g topically daily as needed (for pain).     . feeding supplement (ENSURE ENLIVE / ENSURE PLUS) LIQD Take 237 mLs by mouth 2 (two) times daily between meals. 237 mL 12  . ferrous sulfate 325 (65 FE) MG EC tablet Take 1 tablet (325 mg total) by mouth in the morning and at bedtime. 180 tablet 1  . furosemide (LASIX) 40 MG tablet Take 1 tablet (40 mg total) by mouth 2 (two) times daily. 60 tablet 1  . lactulose (CHRONULAC) 10 GM/15ML solution Take 30 mLs (20 g total) by mouth 3 (three) times daily. 1892 mL 3  . levothyroxine (SYNTHROID, LEVOTHROID) 75 MCG tablet Take 1 tablet (75 mcg total) by mouth daily before breakfast. 30 tablet 0  . Multiple Vitamin (MULTIVITAMIN WITH MINERALS) TABS tablet Take 1 tablet by mouth daily.    Marland Kitchen omeprazole (PRILOSEC) 20 MG capsule Take 1 capsule (20 mg total) by mouth daily. 30 capsule 5  . ondansetron (ZOFRAN ODT) 4 MG disintegrating tablet Take 1 tablet (4 mg total) by mouth every 8 (eight) hours as needed for nausea or vomiting. 40 tablet 3  . Oxycodone HCl 10 MG TABS 1 tab po tid prn pain 90 tablet 0  . potassium chloride SA (KLOR-CON) 20 MEQ tablet Take 2 tablets (40 mEq total) by mouth daily. 30 tablet 2  . rifaximin (XIFAXAN) 550 MG TABS tablet Take 1 tablet (550 mg total) by mouth 2 (two) times daily. Pt states she take once daily 60 tablet 1  . sertraline (ZOLOFT) 100 MG tablet Take 100 mg by mouth daily.    . sertraline (ZOLOFT) 50 MG tablet Take 1 tablet (50 mg total) by mouth daily. 4 tablet 0  . spironolactone  (ALDACTONE) 100 MG tablet Take 2 tablets (200 mg total) by mouth daily. 30 tablet 2  . traZODone (DESYREL) 50 MG tablet Take 1 tablet (50 mg total) by mouth at bedtime as needed for sleep. 4 tablet 0   No current facility-administered medications on file prior to visit.    ALLERGIES: Allergies  Allergen Reactions  . Penicillins Swelling and Other (See Comments)    FACIAL SWELLING Did it involve swelling of the face/tongue/throat, SOB, or low BP? Yes Did it involve sudden or severe rash/hives, skin peeling, or any reaction on the inside of your mouth or nose? No Did you need to seek medical attention at a hospital or doctor's office? Unknown When did it last happen?30 years If all above answers are "NO", may proceed with cephalosporin use.   . Tylenol [Acetaminophen] Nausea Only  . Sulfa Antibiotics Itching and Rash  . Tramadol Swelling and Rash    FAMILY HISTORY: Family History  Adopted: Yes  Problem Relation Age of Onset  . Alcohol abuse Daughter    ***.  SOCIAL HISTORY: Social History   Socioeconomic History  . Marital status: Married    Spouse name: Not on file  . Number of children: 2  . Years of education: Not on file  . Highest education level: Not on file  Occupational History  . Occupation: disabled  Tobacco Use  . Smoking status: Never Smoker  . Smokeless tobacco: Never Used  Vaping Use  . Vaping Use: Never used  Substance and Sexual Activity  . Alcohol use: No  . Drug use: No  . Sexual activity: Not Currently    Birth control/protection: Post-menopausal  Other Topics Concern  . Not on file  Social History Narrative  . Not on file   Social Determinants of Health   Financial Resource Strain: Not on file  Food Insecurity: Not on file  Transportation Needs: No Transportation Needs  . Lack of Transportation (Medical): No  . Lack of Transportation (Non-Medical): No  Physical Activity: Not on file  Stress: Not on file  Social Connections: Not  on file  Intimate Partner Violence: Not on file    Objective:  *** General: No acute distress.  Patient appears well-groomed.   Head:  Normocephalic/atraumatic Eyes:  fundi examined but not visualized Neck: supple, no paraspinal tenderness, full range of motion Back: No paraspinal tenderness Heart: regular rate and rhythm Lungs: Clear to auscultation bilaterally. Vascular: No carotid bruits. Neurological Exam: Mental status: alert and oriented to person, place, and time, recent and remote memory intact, fund of knowledge intact, attention and concentration intact, speech fluent and not dysarthric, language intact. Cranial nerves: CN I: not tested CN II: pupils equal, round and reactive to light, visual fields intact CN III, IV, VI:  full range of motion, no nystagmus, no ptosis CN V: facial sensation intact. CN VII: upper and lower face symmetric CN VIII: hearing intact CN IX, X: gag intact, uvula midline CN XI: sternocleidomastoid and trapezius muscles intact CN XII: tongue midline Bulk & Tone: normal, no fasciculations. Motor:  muscle strength 5/5 throughout Sensation:  Pinprick, temperature and vibratory sensation intact. Deep Tendon Reflexes:  2+ throughout,  toes downgoing.   Finger to nose testing:  Without dysmetria.   Heel to shin:  Without dysmetria.   Gait:  Normal station and stride.  Romberg negative.  Assessment/Plan:   ***    Thank you for allowing me to take part in the care of this patient.  Metta Clines, DO  CC: ***

## 2020-10-16 NOTE — Telephone Encounter (Signed)
Trazodone was completed as printed prescription. Signed by PCP and faxed to pharmacy

## 2020-10-17 ENCOUNTER — Ambulatory Visit: Payer: Medicare HMO | Admitting: Neurology

## 2020-10-18 ENCOUNTER — Ambulatory Visit (HOSPITAL_COMMUNITY): Admission: RE | Admit: 2020-10-18 | Payer: Medicare HMO | Source: Home / Self Care | Admitting: Gastroenterology

## 2020-10-18 ENCOUNTER — Encounter (HOSPITAL_COMMUNITY): Admission: RE | Payer: Self-pay | Source: Home / Self Care

## 2020-10-18 SURGERY — ESOPHAGOSCOPY, WITH ESOPHAGEAL VARICES BAND LIGATION
Anesthesia: Monitor Anesthesia Care

## 2020-10-22 ENCOUNTER — Ambulatory Visit (HOSPITAL_COMMUNITY)
Admission: RE | Admit: 2020-10-22 | Discharge: 2020-10-22 | Disposition: A | Discharge status: Home / Self Care | Payer: Medicare HMO | Source: Ambulatory Visit | Attending: Gastroenterology | Admitting: Gastroenterology

## 2020-10-22 ENCOUNTER — Other Ambulatory Visit: Payer: Self-pay

## 2020-10-22 DIAGNOSIS — K7460 Unspecified cirrhosis of liver: Secondary | ICD-10-CM

## 2020-10-22 DIAGNOSIS — R188 Other ascites: Secondary | ICD-10-CM | POA: Diagnosis present

## 2020-10-22 HISTORY — PX: IR PARACENTESIS: IMG2679

## 2020-10-22 LAB — BODY FLUID CELL COUNT WITH DIFFERENTIAL
Eos, Fluid: 0 %
Lymphs, Fluid: 1 %
Monocyte-Macrophage-Serous Fluid: 38 % — ABNORMAL LOW (ref 50–90)
Neutrophil Count, Fluid: 61 % — ABNORMAL HIGH (ref 0–25)
Total Nucleated Cell Count, Fluid: 30 cu mm (ref 0–1000)

## 2020-10-22 MED ORDER — ALBUMIN HUMAN 25 % IV SOLN
50.0000 g | Freq: Once | INTRAVENOUS | Status: AC
  Administered 2020-10-22: 50 g via INTRAVENOUS
  Filled 2020-10-22: qty 200

## 2020-10-22 MED ORDER — ALBUMIN HUMAN 25 % IV SOLN
INTRAVENOUS | Status: AC
  Filled 2020-10-22: qty 200

## 2020-10-22 MED ORDER — LIDOCAINE HCL 1 % IJ SOLN
INTRAMUSCULAR | Status: AC
  Filled 2020-10-22: qty 20

## 2020-10-22 MED ORDER — LIDOCAINE HCL 1 % IJ SOLN
INTRAMUSCULAR | Status: DC | PRN
  Administered 2020-10-22: 10 mL

## 2020-10-22 NOTE — Procedures (Signed)
PROCEDURE SUMMARY:  Successful image-guided paracentesis from the left lateral abdomen.  Yielded 7.7 liters of clear yello fluid.  No immediate complications.  EBL < 1 mL. Patient tolerated well.   Specimen was sent for labs.  Please see imaging section of Epic for full dictation.   Claris Pong Sylvia Kondracki PA-C 10/22/2020 11:11 AM

## 2020-10-23 LAB — PATHOLOGIST SMEAR REVIEW

## 2020-10-24 ENCOUNTER — Telehealth: Payer: Self-pay | Admitting: Family Medicine

## 2020-10-24 NOTE — Telephone Encounter (Signed)
Pt needs to contact Dr. Doyne Keel office (her GI provider) b/c they are the ones who would arrange this for her.-thx

## 2020-10-24 NOTE — Telephone Encounter (Signed)
Please advise if this is okay or need referral

## 2020-10-24 NOTE — Telephone Encounter (Signed)
Patient called about paracentesis. Wants to know when she can have it done again. Very hard to understand patient. Please call her to advise.

## 2020-10-28 ENCOUNTER — Telehealth: Payer: Self-pay | Admitting: Gastroenterology

## 2020-10-28 ENCOUNTER — Other Ambulatory Visit: Payer: Self-pay

## 2020-10-28 DIAGNOSIS — K7460 Unspecified cirrhosis of liver: Secondary | ICD-10-CM

## 2020-10-28 DIAGNOSIS — R188 Other ascites: Secondary | ICD-10-CM

## 2020-10-28 NOTE — Telephone Encounter (Signed)
[facility] pt calling requesting to have an IR para asap. She had one on 12/21 and had 7.7 liters of fluid drawn off. Pt is requesting another para, reports it feels as if all the fluid that was drained off is back. Pt has cirrhosis. Please advise as DOD.

## 2020-10-28 NOTE — Telephone Encounter (Signed)
Pt needs to schedule paracentesis asap. Pls call her.

## 2020-10-28 NOTE — Telephone Encounter (Signed)
OK to order large volume paracentesis   Note that if she is miserable she may get it more quickly by going to ED but we should try to get it done electively  Take up to 10 L  Give 50 g albumin IV  Have her come do CBC, CMET, INR unless she decides to go to ED  Dx cirrhosis and ascites

## 2020-10-28 NOTE — Telephone Encounter (Signed)
Thank you for covering Glendell Docker, I agree with your plan

## 2020-10-28 NOTE — Telephone Encounter (Signed)
Pt scheduled for IR para at [facility] 10/30/20@10am , pt to arrive there at 9:45am. Pt to have labs drawn there, orders in epic. Pt aware of appt.

## 2020-10-28 NOTE — Telephone Encounter (Signed)
LM for pt to return call to discuss.  

## 2020-10-29 ENCOUNTER — Telehealth: Payer: Self-pay | Admitting: *Deleted

## 2020-10-29 NOTE — Telephone Encounter (Signed)
Patient no showed PV today- Called patient and left message to return call by 5 pm today- If no call by 5 pm, PV and procedure will be canceled - no call at 5 pm -  PV  canceled-  Pt is a [facility] EGD case- note to Delila Spence , RN about the NS and to RS - No Show letter mailed to patient and sent in My Chart   [name] PV

## 2020-10-29 NOTE — Telephone Encounter (Signed)
Spoke with patient and she has already contacted their office. Appt scheduled for tomorrow to have this done

## 2020-10-30 ENCOUNTER — Ambulatory Visit (HOSPITAL_COMMUNITY)
Admission: RE | Admit: 2020-10-30 | Discharge: 2020-10-30 | Disposition: A | Discharge status: Home / Self Care | Payer: Medicare HMO | Source: Ambulatory Visit | Attending: Internal Medicine | Admitting: Internal Medicine

## 2020-10-30 ENCOUNTER — Other Ambulatory Visit: Payer: Self-pay

## 2020-10-30 ENCOUNTER — Other Ambulatory Visit (HOSPITAL_COMMUNITY): Payer: Self-pay | Admitting: Gastroenterology

## 2020-10-30 DIAGNOSIS — K7460 Unspecified cirrhosis of liver: Secondary | ICD-10-CM

## 2020-10-30 DIAGNOSIS — R188 Other ascites: Secondary | ICD-10-CM | POA: Insufficient documentation

## 2020-10-30 HISTORY — PX: IR PARACENTESIS: IMG2679

## 2020-10-30 LAB — COMPREHENSIVE METABOLIC PANEL
ALT: 25 U/L (ref 0–44)
AST: 61 U/L — ABNORMAL HIGH (ref 15–41)
Albumin: 2.7 g/dL — ABNORMAL LOW (ref 3.5–5.0)
Alkaline Phosphatase: 164 U/L — ABNORMAL HIGH (ref 38–126)
Anion gap: 8 (ref 5–15)
BUN: 11 mg/dL (ref 8–23)
CO2: 22 mmol/L (ref 22–32)
Calcium: 8.7 mg/dL — ABNORMAL LOW (ref 8.9–10.3)
Chloride: 105 mmol/L (ref 98–111)
Creatinine, Ser: 0.69 mg/dL (ref 0.44–1.00)
GFR, Estimated: >60 mL/min (ref >60)
Glucose, Bld: 93 mg/dL (ref 70–99)
Potassium: 3.9 mmol/L (ref 3.5–5.1)
Sodium: 135 mmol/L (ref 135–145)
Total Bilirubin: 3.6 mg/dL — ABNORMAL HIGH (ref 0.3–1.2)
Total Protein: 6.1 g/dL — ABNORMAL LOW (ref 6.5–8.1)

## 2020-10-30 LAB — CBC WITH DIFFERENTIAL/PLATELET
Abs Immature Granulocytes: 0.11 10*3/uL — ABNORMAL HIGH (ref 0.00–0.07)
Basophils Absolute: 0.1 10*3/uL (ref 0.0–0.1)
Basophils Relative: 1 %
Eosinophils Absolute: 0.3 10*3/uL (ref 0.0–0.5)
Eosinophils Relative: 3 %
HCT: 34.4 % — ABNORMAL LOW (ref 36.0–46.0)
Hemoglobin: 9.8 g/dL — ABNORMAL LOW (ref 12.0–15.0)
Immature Granulocytes: 1 %
Lymphocytes Relative: 7 %
Lymphs Abs: 0.7 10*3/uL (ref 0.7–4.0)
MCH: 25.7 pg — ABNORMAL LOW (ref 26.0–34.0)
MCHC: 28.5 g/dL — ABNORMAL LOW (ref 30.0–36.0)
MCV: 90.3 fL (ref 80.0–100.0)
Monocytes Absolute: 1 10*3/uL (ref 0.1–1.0)
Monocytes Relative: 11 %
Neutro Abs: 7.2 10*3/uL (ref 1.7–7.7)
Neutrophils Relative %: 77 %
Platelets: 73 10*3/uL — ABNORMAL LOW (ref 150–400)
RBC: 3.81 MIL/uL — ABNORMAL LOW (ref 3.87–5.11)
RDW: 26.3 % — ABNORMAL HIGH (ref 11.5–15.5)
WBC: 9.3 10*3/uL (ref 4.0–10.5)
nRBC: 0 % (ref 0.0–0.2)

## 2020-10-30 LAB — PROTIME-INR
INR: 1.6 — ABNORMAL HIGH (ref 0.8–1.2)
Prothrombin Time: 18.8 seconds — ABNORMAL HIGH (ref 11.4–15.2)

## 2020-10-30 MED ORDER — ALBUMIN HUMAN 25 % IV SOLN
50.0000 g | Freq: Once | INTRAVENOUS | Status: AC
  Administered 2020-10-30: 50 g via INTRAVENOUS
  Filled 2020-10-30: qty 200

## 2020-10-30 MED ORDER — ALBUMIN HUMAN 25 % IV SOLN
INTRAVENOUS | Status: AC
  Filled 2020-10-30: qty 200

## 2020-10-30 MED ORDER — LIDOCAINE HCL (PF) 1 % IJ SOLN
INTRAMUSCULAR | Status: DC | PRN
  Administered 2020-10-30: 10 mL

## 2020-10-30 MED ORDER — LIDOCAINE HCL 1 % IJ SOLN
INTRAMUSCULAR | Status: AC
  Filled 2020-10-30: qty 20

## 2020-10-30 NOTE — Procedures (Signed)
PROCEDURE SUMMARY:  Successful US guided paracentesis from left lateral abdomen.  Yielded 10.0 lites of yellow fluid.  No immediate complications.  Pt tolerated well.   Specimen was not sent for labs.  EBL < 66m  KDocia BarrierPA-C 10/30/2020 11:44 AM

## 2020-10-30 NOTE — Progress Notes (Signed)
Endo call attempted.  Left voicemail for patient to return call.

## 2020-10-31 ENCOUNTER — Telehealth: Payer: Self-pay

## 2020-10-31 NOTE — Telephone Encounter (Signed)
Lm on vm for patient to return call to reschedule her pre-visit appointment prior to her upcoming procedure with Dr. Havery Moros.

## 2020-10-31 NOTE — Telephone Encounter (Signed)
-----   Message from Steva Ready, RN sent at 10/29/2020  5:03 PM EST ----- This pt did not show for the PV today- she has a [facility] EGD case 1-13 for cirrhosis with [facility]  I called her and Left a message - she did not return my call to RS her PV  [name] PV

## 2020-11-02 ENCOUNTER — Other Ambulatory Visit: Payer: Self-pay

## 2020-11-02 ENCOUNTER — Encounter (HOSPITAL_COMMUNITY): Payer: Self-pay | Admitting: Emergency Medicine

## 2020-11-02 ENCOUNTER — Emergency Department (HOSPITAL_COMMUNITY): Payer: Medicare HMO

## 2020-11-02 ENCOUNTER — Emergency Department (HOSPITAL_COMMUNITY)
Admission: EM | Admit: 2020-11-02 | Discharge: 2020-11-03 | Disposition: A | Discharge status: Home / Self Care | Payer: Medicare HMO | Attending: Emergency Medicine | Admitting: Emergency Medicine

## 2020-11-02 DIAGNOSIS — Z9289 Personal history of other medical treatment: Secondary | ICD-10-CM | POA: Insufficient documentation

## 2020-11-02 DIAGNOSIS — R188 Other ascites: Secondary | ICD-10-CM | POA: Insufficient documentation

## 2020-11-02 DIAGNOSIS — R1084 Generalized abdominal pain: Secondary | ICD-10-CM | POA: Diagnosis not present

## 2020-11-02 DIAGNOSIS — E039 Hypothyroidism, unspecified: Secondary | ICD-10-CM | POA: Insufficient documentation

## 2020-11-02 DIAGNOSIS — B3781 Candidal esophagitis: Secondary | ICD-10-CM

## 2020-11-02 HISTORY — DX: Candidal esophagitis: B37.81

## 2020-11-02 LAB — BRAIN NATRIURETIC PEPTIDE: B Natriuretic Peptide: 62 pg/mL (ref 0.0–100.0)

## 2020-11-02 LAB — COMPREHENSIVE METABOLIC PANEL
ALT: 17 U/L (ref 0–44)
AST: 44 U/L — ABNORMAL HIGH (ref 15–41)
Albumin: 2.7 g/dL — ABNORMAL LOW (ref 3.5–5.0)
Alkaline Phosphatase: 133 U/L — ABNORMAL HIGH (ref 38–126)
Anion gap: 5 (ref 5–15)
BUN: 10 mg/dL (ref 8–23)
CO2: 21 mmol/L — ABNORMAL LOW (ref 22–32)
Calcium: 8.1 mg/dL — ABNORMAL LOW (ref 8.9–10.3)
Chloride: 108 mmol/L (ref 98–111)
Creatinine, Ser: 0.57 mg/dL (ref 0.44–1.00)
GFR, Estimated: >60 mL/min (ref >60)
Glucose, Bld: 105 mg/dL — ABNORMAL HIGH (ref 70–99)
Potassium: 3.7 mmol/L (ref 3.5–5.1)
Sodium: 134 mmol/L — ABNORMAL LOW (ref 135–145)
Total Bilirubin: 2.1 mg/dL — ABNORMAL HIGH (ref 0.3–1.2)
Total Protein: 5.4 g/dL — ABNORMAL LOW (ref 6.5–8.1)

## 2020-11-02 LAB — CBC
HCT: 29.2 % — ABNORMAL LOW (ref 36.0–46.0)
Hemoglobin: 8.4 g/dL — ABNORMAL LOW (ref 12.0–15.0)
MCH: 26.5 pg (ref 26.0–34.0)
MCHC: 28.8 g/dL — ABNORMAL LOW (ref 30.0–36.0)
MCV: 92.1 fL (ref 80.0–100.0)
Platelets: 44 10*3/uL — ABNORMAL LOW (ref 150–400)
RBC: 3.17 MIL/uL — ABNORMAL LOW (ref 3.87–5.11)
RDW: 25.5 % — ABNORMAL HIGH (ref 11.5–15.5)
WBC: 4.8 10*3/uL (ref 4.0–10.5)
nRBC: 0 % (ref 0.0–0.2)

## 2020-11-02 LAB — LACTIC ACID, PLASMA: Lactic Acid, Venous: 2.3 mmol/L (ref 0.5–1.9)

## 2020-11-02 LAB — PROTIME-INR
INR: 1.8 — ABNORMAL HIGH (ref 0.8–1.2)
Prothrombin Time: 20.2 seconds — ABNORMAL HIGH (ref 11.4–15.2)

## 2020-11-02 LAB — LIPASE, BLOOD: Lipase: 21 U/L (ref 11–51)

## 2020-11-02 MED ORDER — IOHEXOL 300 MG/ML  SOLN
100.0000 mL | Freq: Once | INTRAMUSCULAR | Status: AC | PRN
  Administered 2020-11-02: 80 mL via INTRAVENOUS

## 2020-11-02 MED ORDER — FENTANYL CITRATE (PF) 100 MCG/2ML IJ SOLN
25.0000 ug | Freq: Once | INTRAMUSCULAR | Status: AC
  Administered 2020-11-02: 25 ug via INTRAVENOUS
  Filled 2020-11-02: qty 2

## 2020-11-02 MED ORDER — OXYCODONE HCL 5 MG PO TABS: 5.0000 mg | ORAL_TABLET | ORAL | 0 refills | Status: DC | PRN

## 2020-11-02 NOTE — ED Triage Notes (Signed)
Pt to the ED after leaking from a paracentesis with redness, pitting edema, and distention.

## 2020-11-02 NOTE — Discharge Instructions (Signed)
You were evaluated in the Emergency Department and after careful evaluation, we did not find any emergent condition requiring admission or further testing in the hospital.  Your exam/testing today was overall reassuring.  Please call [phone] to schedule an outpatient paracentesis by interventional radiology.  Please return to the Emergency Department if you experience any worsening of your condition.  Thank you for allowing [place] to be a part of your care.

## 2020-11-02 NOTE — ED Notes (Signed)
Patient transported to CT 

## 2020-11-02 NOTE — ED Notes (Addendum)
Entered room and introduced self to patient. Pt appears to be resting in bed, respirations are even and unlabored with equal chest rise and fall. Bed is locked in the lowest position, side rails x2, call bell within reach. Pt educated on call light use and hourly rounding, verbalized understanding and in agreement at this time. All questions and concerns voiced addressed. Refreshments offered and provided per patient request.

## 2020-11-02 NOTE — ED Notes (Signed)
Dr. Sedonia Small at bedside at this time, new towel placed to LLQ due to previous towel saturation.

## 2020-11-02 NOTE — ED Notes (Signed)
CRITICAL VALUE ALERT  Critical Value: Lactic acid 2.3  Date & Time Notied:  1/12022 & 1820 Provider Notified: Dr. Sedonia Small  Orders Received/Actions taken: Notified

## 2020-11-02 NOTE — ED Provider Notes (Signed)
[facility] Emergency Department Provider Note MRN:  [id number]  Arrival date & time: 11/02/20     Chief Complaint   Abdominal Pain   History of Present Illness   Megan Roberson is a 65 y.o. year-old female with a history of cirrhosis presenting to the ED with chief complaint of abdominal.  Patient having worsening diffuse abdominal pain over the past 3 days.  She explains that she underwent a paracentesis and had a large amount of fluid drained.  She has been leaking from the paracentesis site since that time.  Denies fever, no chest pain or shortness of breath, no nausea vomiting, no diarrhea, no other complaints.  Symptoms are mild to moderate, constant, worse with motion or palpation.  Review of Systems  A complete 10 system review of systems was obtained and all systems are negative except as noted in the HPI and PMH.   Patient's Health History    Past Medical History:  Diagnosis Date  . Anxiety and depression   . Cholelithiasis without cholecystitis   . Chronic abdominal pain   . Chronic nausea    + abd pain  . Cirrhosis of liver with ascites (HCC)    Alc cirrhosis; hx of multiple abd paracentesis, +esoph varices (+banding), pancytopenia, hx of hepatic enceph, hx of hydrothorax  . Heart murmur    Echo 10/06/17 [facility]): Normal LV/RV size, basal septal hypertophy-normal variant, LVEF 65-70%, normal LV/RV function, est RAP 5 mmHg, no sign valvular stenosis or regurg--trace MR/TR/PR  . History of abnormal cervical Pap smear   . History of pneumonia   . Hypothyroidism   . Insomnia   . Nephrolithiasis    right renal pelvis, 13-62m nonobst stone  . Neuropathy    unclear hx  . Osteoarthritis, multiple sites    primarily hands and knees  . Osteoporosis 02/09/2018  . Ovarian cyst    bilat.  Stable on MRI pelvis 11/2019 and CT abd/pelv 09/2020--no f/u imaging indicated.  . [place]ancytopenia ([place]   . Splenomegaly   . [place] 12/01/2019    severe, hx of plts down into 30s    Past Surgical History:  Procedure Laterality Date  . AUGMENTATION MAMMAPLASTY Bilateral    20 years ago  . BIOPSY  03/15/2018   gastric.  Procedure: BIOPSY;  Surgeon: AYetta Flock MD;  [facility];  Service: Gastroenterology;;  Gastric  . COLONOSCOPY WITH ESOPHAGOGASTRODUODENOSCOPY (EGD) AND ESOPHAGEAL DILATION (ED)    . COLONOSCOPY WITH PROPOFOL N/A 03/15/2018   Redundant colon, severe looping, very edematous colon c/w portal HTN.  Procedure: COLONOSCOPY WITH PROPOFOL;  Surgeon: AYetta Flock MD;  [facility];  Service: Gastroenterology;  Laterality: N/A;  . ESOPHAGOGASTRODUODENOSCOPY (EGD) WITH PROPOFOL N/A 03/15/2018   Gastritis, small gastric ulcer. Procedure: ESOPHAGOGASTRODUODENOSCOPY (EGD) WITH PROPOFOL;  Surgeon: AYetta Flock MD;  [facility];  Service: Gastroenterology;  Laterality: N/A;  . ESOPHAGOGASTRODUODENOSCOPY W/ BANDING     Varices  . FRACTURE SURGERY Left    x 2 L elbow and hip left   . HARDWARE REMOVAL Left 09/17/2017   Procedure: HARDWARE REMOVAL LEFT OLECRANON;  Surgeon: HAltamese Meadowbrook MD;  [place]  Service: Orthopedics;  Laterality: Left;  . IR PARACENTESIS  11/18/2017  . IR PARACENTESIS  11/26/2017  . IR PARACENTESIS  03/14/2018  . IR PARACENTESIS  08/08/2020  . IR PARACENTESIS  09/23/2020  . IR PARACENTESIS  10/22/2020  . IR PARACENTESIS  10/30/2020  . KNEE SURGERY Right  open incision for ligaments  . ORIF ELBOW FRACTURE Left 04/30/2017   Procedure: REVISION OPEN REDUCTION INTERNAL FIXATION (ORIF) ELBOW/OLECRANON FRACTURE;  Surgeon: Altamese [place], MD;  Location: [place];  Service: Orthopedics;  Laterality: Left;  . ORIF HIP FRACTURE Left   . REPAIR EXTENSOR TENDON Left 12/29/2019   Procedure: LEFT LONG AND LEFT RING FRINGER EXTENSOR REALIGNAMENT WITH POSSIBLE TENDON TRANSFER;  Surgeon: Charlotte Crumb, MD;  Location: [place];  Service: Orthopedics;  Laterality:  Left;  . virtual colonoscopy  04/24/2019   no polyps, no mass, no apple core lesion, no stricture    Family History  Adopted: Yes  Problem Relation Age of Onset  . Alcohol abuse Daughter     Social History   Socioeconomic History  . Marital status: Married    Spouse name: Not on file  . Number of children: 2  . Years of education: Not on file  . Highest education level: Not on file  Occupational History  . Occupation: disabled  Tobacco Use  . Smoking status: Never Smoker  . Smokeless tobacco: Never Used  Vaping Use  . Vaping Use: Never used  Substance and Sexual Activity  . Alcohol use: No  . Drug use: No  . Sexual activity: Not Currently    Birth control/protection: Post-menopausal  Other Topics Concern  . Not on file  Social History Narrative  . Not on file   Social Determinants of Health   Financial Resource Strain: Not on file  Food Insecurity: Not on file  Transportation Needs: No Transportation Needs  . Lack of Transportation (Medical): No  . Lack of Transportation (Non-Medical): No  Physical Activity: Not on file  Stress: Not on file  Social Connections: Not on file  Intimate Partner Violence: Not on file     Physical Exam   Vitals:   11/02/20 1715 11/02/20 1942  BP: 100/78 118/65  Pulse: 78 66  Resp: 16 17  Temp:    SpO2: 95% 99%    CONSTITUTIONAL: Chronically ill-appearing, NAD NEURO:  Alert and oriented x 3, no focal deficits EYES:  eyes equal and reactive ENT/NECK:  no LAD, no JVD CARDIO: Regular rate, well-perfused, normal S1 and S2 PULM:  CTAB no wheezing or rhonchi GI/GU:  normal bowel sounds, severely distended, tender to palpation MSK/SPINE:  No gross deformities, no edema SKIN:  no rash, atraumatic PSYCH:  Appropriate speech and behavior  *Additional and/or pertinent findings included in MDM below  Diagnostic and Interventional Summary    EKG Interpretation  Date/Time:    Ventricular Rate:    PR Interval:    QRS Duration:    QT Interval:    QTC Calculation:   R Axis:     Text Interpretation:        Labs Reviewed  PROTIME-INR - Abnormal; Notable for the following components:      Result Value   Prothrombin Time 20.2 (*)    INR 1.8 (*)    All other components within normal limits  CBC - Abnormal; Notable for the following components:   RBC 3.17 (*)    Hemoglobin 8.4 (*)    HCT 29.2 (*)    MCHC 28.8 (*)    RDW 25.5 (*)    Platelets 44 (*)    All other components within normal limits  COMPREHENSIVE METABOLIC PANEL - Abnormal; Notable for the following components:   Sodium 134 (*)    CO2 21 (*)    Glucose, Bld 105 (*)    Calcium 8.1 (*)  Total Protein 5.4 (*)    Albumin 2.7 (*)    AST 44 (*)    Alkaline Phosphatase 133 (*)    Total Bilirubin 2.1 (*)    All other components within normal limits  LACTIC ACID, PLASMA - Abnormal; Notable for the following components:   Lactic Acid, Venous 2.3 (*)    All other components within normal limits  RESP PANEL BY RT-PCR (FLU A&B, COVID) ARPGX2  LIPASE, BLOOD  BRAIN NATRIURETIC PEPTIDE  URINALYSIS, ROUTINE W REFLEX MICROSCOPIC    CT ABDOMEN PELVIS W CONTRAST  Final Result      Medications  iohexol (OMNIPAQUE) 300 MG/ML solution 100 mL (80 mLs Intravenous Contrast Given 11/02/20 1743)  fentaNYL (SUBLIMAZE) injection 25 mcg (25 mcg Intravenous Given 11/02/20 1807)     Procedures  /  Critical Care Procedures  ED Course and Medical Decision Making  I have reviewed the triage vital signs, the nursing notes, and pertinent available records from the EMR.  Listed above are laboratory and imaging tests that I personally ordered, reviewed, and interpreted and then considered in my medical decision making (see below for details).  Tender abdomen, suspect reaccumulation of significant ascites, with the continued draining there is concern for possible SBP given the possible tract for bacteria to enter.  Nontoxic, afebrile, normal vital signs, awaiting CT  imaging.     CT reveals ascites with the appearance of ascitic fluid extending into the soft tissues where patient is actively draining.  No signs of infection or emergent process.  Discussed the possibility of hospitalization with Dr. Sheran Lawless of hospital service, there would be no IR intervention available until Monday.  Patient's pain is adequately controlled at this time, given the lack of benefit to admission at this juncture, patient is agreeable to discharge and will call the IR offices for outpatient procedure.  I was able to speak with Dr. Kathlene Cote of interventional radiology, who recommended Dermabond closure of the leaking area, which I was able to perform successfully.  Barth Kirks. Sedonia Small, [place] [email or web address]  Final Clinical Impressions(s) / ED Diagnoses     ICD-10-CM   1. Generalized abdominal pain  R10.84   2. Ascites of liver  R18.8     ED Discharge Orders         Ordered    oxyCODONE (ROXICODONE) 5 MG immediate release tablet  Every 4 hours PRN        11/02/20 2007           Discharge Instructions Discussed with and Provided to Patient:     Discharge Instructions     You were evaluated in the Emergency Department and after careful evaluation, we did not find any emergent condition requiring admission or further testing in the hospital.  Your exam/testing today was overall reassuring.  Please call [phone] to schedule an outpatient paracentesis by interventional radiology.  Please return to the Emergency Department if you experience any worsening of your condition.  Thank you for allowing [place] to be a part of your care.        Maudie Flakes, MD 11/02/20 2011

## 2020-11-03 LAB — URINALYSIS, ROUTINE W REFLEX MICROSCOPIC
Bilirubin Urine: NEGATIVE
Glucose, UA: NEGATIVE mg/dL
Ketones, ur: NEGATIVE mg/dL
Leukocytes,Ua: NEGATIVE
Nitrite: NEGATIVE
Protein, ur: 100 mg/dL — AB
RBC / HPF: >50 RBC/hpf — ABNORMAL HIGH (ref 0–5)
Specific Gravity, Urine: >1.046 — ABNORMAL HIGH (ref 1.005–1.030)
pH: 5 (ref 5.0–8.0)

## 2020-11-03 MED ORDER — OXYCODONE HCL 5 MG PO TABS
5.0000 mg | ORAL_TABLET | Freq: Once | ORAL | Status: AC
  Administered 2020-11-03: 5 mg via ORAL
  Filled 2020-11-03: qty 1

## 2020-11-03 NOTE — ED Notes (Signed)
Pt resting in hallway, using personal cell phone. NAD.

## 2020-11-03 NOTE — ED Notes (Signed)
Pt is boarding in ED until her husband can pick her up. Vitals will be performed at routine intervals.

## 2020-11-03 NOTE — ED Notes (Signed)
Pt spoke with her husband regarding need for ride home. Husband works night shift and gets off work at [employer] and will pick pt up thereafter.

## 2020-11-03 NOTE — ED Notes (Signed)
Pt moved from hallway bed to room 16.

## 2020-11-04 ENCOUNTER — Ambulatory Visit: Payer: Medicare HMO | Admitting: Family Medicine

## 2020-11-04 DIAGNOSIS — Z0289 Encounter for other administrative examinations: Secondary | ICD-10-CM

## 2020-11-04 NOTE — Progress Notes (Deleted)
OFFICE VISIT  11/04/2020  CC: No chief complaint on file.   HPI:    Patient is a 65 y.o. Caucasian female who presents for 6 wk f/u chronic pain syndrome A/P as of last visit: "1) Chronic pain syndrome: osteoarthritis multiple joints. Relatively recent L ankle fracture and ongoing acute pain related to this as well. Chronic upper abd discomfort. Discussed goals of opioid pain med treatment today---abd pain IS NOT what we are treating, and she understands this. Controlled substance contract reviewed with patient today.  Patient signed this and it will be placed in the chart.   UDS today. Plan at this time is to continue oxycodone 68m, 1 tid prn, #90 per month. No new oxycodone rx given today.  2) Alc+nonalc cirrhosis with portal HTN, chronic ascites, pancytopenia, hx of esoph varices, hx of recurrent hepatic encephalopathy. Cont iron, lasix and aldactone, now plan per GI to get q2 wk scheduled paracentesis (next one in 2d), cont f/u with hepatologist regarding possible transplant candidacy.   Has plan to see hematology for her pancytopenia. Labs drawn today by Dr. AHavery Moros cbc, cmet, afp, folate, vit b12 level.  3) Hypothyroidism: slight T4 dose increase due to mild TSH elevated 08/13/20. Plan for rpt TSH when I see her back for f/u in 138mo 4) Gait instability: ?neuropathy?.  Has neurologist initial evaluation set for next month. HH PT arrangements in the works. Currently wheelchair-bound for the most part. Strongly encouraged use of walker any time she does try to ambulate.  5) Left ankle pain: Recent small, nondisplaced cuneiform fracture noted on x-ray 08/13/20. F/u x-ray 11/5 showed no fracture. Wet to dry dressing L ankle--, possible wound clinic referral in near future if delayed healing. Has appt with Dr. WhDurward Fortesn orthopedics for early next week.  6) L ankle lateral malleolus stage I ulceration---?secondary to friction from the cast she wore briefly on ankle.   This is stable and w/out sign of infection. Cont current wound care: wet to dry, change once daily.  7) Chronic nausea + abd pain: seems at baseline. Zofran. Hx of gallstones but no sign of acute cholecystitis. Labs today by Dr. ArHavery Moros  INTERIM HX: Reviewed 11/02/20 ED visit for abd pain and leaking ascitic fluid from recent paracentesis site (10/30/20). CT abd ok other than confirmation of signif re-accumulation of ascitic fluid + leak, dermabond placed to seal surface leak.    Past Medical History:  Diagnosis Date  . Anxiety and depression   . Cholelithiasis without cholecystitis   . Chronic abdominal pain   . Chronic nausea    + abd pain  . Cirrhosis of liver with ascites (HCC)    Alc cirrhosis; hx of multiple abd paracentesis, +esoph varices (+banding), pancytopenia, hx of hepatic enceph, hx of hydrothorax  . Heart murmur    [facility], basal septal hypertophy-normal variant, LVEF 65-70%, normal LV/RV function, est RAP 5 mmHg, no sign valvular stenosis or regurg--trace MR/TR/PR  . History of abnormal cervical Pap smear   . History of pneumonia   . Hypothyroidism   . Insomnia   . Nephrolithiasis    right renal pelvis, 13-1434monobst stone  . Neuropathy    unclear hx  . Osteoarthritis, multiple sites    primarily hands and knees  . Osteoporosis 02/09/2018  . Ovarian cyst    bilat.  Stable on MRI pelvis 11/2019 and CT abd/pelv 09/2020--no f/u imaging indicated.  . [place] ([place] Splenome[place]aly   . Thrombocytopenia (  [place]) 12/01/2019   severe, hx of plts down into 30s    Past Surgical History:  Procedure Laterality Date  . AUGMENTATION MAMMAPLASTY Bilateral    20 years ago  . BIOPSY  03/15/2018   gastric.  Procedure: BIOPSY;  Surgeon: Yetta Flock, MD;  [facility];  Service: Gastroenterology;;  Gastric  . COLONOSCOPY WITH ESOPHAGOGASTRODUODENOSCOPY (EGD) AND ESOPHAGEAL DILATION (ED)    . COLONOSCOPY WITH PROPOFOL  N/A 03/15/2018   Redundant colon, severe looping, very edematous colon c/w portal HTN.  Procedure: COLONOSCOPY WITH PROPOFOL;  Surgeon: Yetta Flock, MD;  [facility];  Service: Gastroenterology;  Laterality: N/A;  . ESOPHAGOGASTRODUODENOSCOPY (EGD) WITH PROPOFOL N/A 03/15/2018   Gastritis, small gastric ulcer. Procedure: ESOPHAGOGASTRODUODENOSCOPY (EGD) WITH PROPOFOL;  Surgeon: Yetta Flock, MD;  [facility];  Service: Gastroenterology;  Laterality: N/A;  . ESOPHAGOGASTRODUODENOSCOPY W/ BANDING     Varices  . FRACTURE SURGERY Left    x 2 L elbow and hip left   . HARDWARE REMOVAL Left 09/17/2017   Procedure: HARDWARE REMOVAL LEFT OLECRANON;  Surgeon: Altamese Ericson, MD;  Location: [place];  Service: Orthopedics;  Laterality: Left;  . IR PARACENTESIS  11/18/2017  . IR PARACENTESIS  11/26/2017  . IR PARACENTESIS  03/14/2018  . IR PARACENTESIS  08/08/2020  . IR PARACENTESIS  09/23/2020  . IR PARACENTESIS  10/22/2020  . IR PARACENTESIS  10/30/2020  . KNEE SURGERY Right    open incision for ligaments  . ORIF ELBOW FRACTURE Left 04/30/2017   Procedure: REVISION OPEN REDUCTION INTERNAL FIXATION (ORIF) ELBOW/OLECRANON FRACTURE;  Surgeon: Altamese Goodhue, MD;  Location: [place];  Service: Orthopedics;  Laterality: Left;  . ORIF HIP FRACTURE Left   . REPAIR EXTENSOR TENDON Left 12/29/2019   Procedure: LEFT LONG AND LEFT RING FRINGER EXTENSOR REALIGNAMENT WITH POSSIBLE TENDON TRANSFER;  Surgeon: Charlotte Crumb, MD;  Location: [place];  Service: Orthopedics;  Laterality: Left;  . virtual colonoscopy  04/24/2019   no polyps, no mass, no apple core lesion, no stricture    Outpatient Medications Prior to Visit  Medication Sig Dispense Refill  . diclofenac Sodium (VOLTAREN) 1 % GEL Apply 2 g topically daily as needed (for pain).     . feeding supplement (ENSURE ENLIVE / ENSURE PLUS) LIQD Take 237 mLs by mouth 2 (two) times daily between meals. 237 mL 12  . ferrous sulfate  325 (65 FE) MG EC tablet Take 1 tablet (325 mg total) by mouth in the morning and at bedtime. 180 tablet 1  . furosemide (LASIX) 40 MG tablet Take 1 tablet (40 mg total) by mouth 2 (two) times daily. 60 tablet 1  . lactulose (CHRONULAC) 10 GM/15ML solution Take 30 mLs (20 g total) by mouth 3 (three) times daily. 1892 mL 3  . levothyroxine (SYNTHROID, LEVOTHROID) 75 MCG tablet Take 1 tablet (75 mcg total) by mouth daily before breakfast. 30 tablet 0  . Multiple Vitamin (MULTIVITAMIN WITH MINERALS) TABS tablet Take 1 tablet by mouth daily.    Marland Kitchen omeprazole (PRILOSEC) 20 MG capsule Take 1 capsule (20 mg total) by mouth daily. 30 capsule 5  . ondansetron (ZOFRAN ODT) 4 MG disintegrating tablet Take 1 tablet (4 mg total) by mouth every 8 (eight) hours as needed for nausea or vomiting. 40 tablet 3  . oxyCODONE (ROXICODONE) 5 MG immediate release tablet Take 1 tablet (5 mg total) by mouth every 4 (four) hours as needed for severe pain. 8 tablet 0  . Oxycodone HCl 10  MG TABS 1 tab po tid prn pain 90 tablet 0  . potassium chloride SA (KLOR-CON) 20 MEQ tablet Take 2 tablets (40 mEq total) by mouth daily. 30 tablet 2  . rifaximin (XIFAXAN) 550 MG TABS tablet Take 1 tablet (550 mg total) by mouth 2 (two) times daily. Pt states she take once daily 60 tablet 1  . sertraline (ZOLOFT) 100 MG tablet Take 100 mg by mouth daily.    . sertraline (ZOLOFT) 50 MG tablet Take 1 tablet (50 mg total) by mouth daily. 4 tablet 0  . spironolactone (ALDACTONE) 100 MG tablet Take 2 tablets (200 mg total) by mouth daily. 30 tablet 2  . traZODone (DESYREL) 50 MG tablet Take 1 tablet (50 mg total) by mouth at bedtime as needed for sleep. 30 tablet 6   No facility-administered medications prior to visit.    Allergies  Allergen Reactions  . Penicillins Swelling and Other (See Comments)    FACIAL SWELLING Did it involve swelling of the face/tongue/throat, SOB, or low BP? Yes Did it involve sudden or severe rash/hives, skin  peeling, or any reaction on the inside of your mouth or nose? No Did you need to seek medical attention at a hospital or doctor's office? Unknown When did it last happen?30 years If all above answers are "NO", may proceed with cephalosporin use.   . Tylenol [Acetaminophen] Nausea Only  . Sulfa Antibiotics Itching and Rash  . Tramadol Swelling and Rash    ROS As per HPI  PE: Vitals with BMI 11/03/2020 11/03/2020 11/03/2020  Height - - -  Weight - - -  BMI - - -  Systolic 224 497 530  Diastolic 58 58 57  Pulse 70 70 -     ***  LABS:  Lab Results  Component Value Date   TSH 6.506 (H) 08/13/2020   Lab Results  Component Value Date   WBC 4.8 11/02/2020   HGB 8.4 (L) 11/02/2020   HCT 29.2 (L) 11/02/2020   MCV 92.1 11/02/2020   PLT 44 (L) 11/02/2020   Lab Results  Component Value Date   IRON 28 08/13/2020   TIBC 340 08/13/2020   FERRITIN 36 08/13/2020   Lab Results  Component Value Date   VITAMINB12 990 (H) 09/17/2020    Lab Results  Component Value Date   CREATININE 0.57 11/02/2020   BUN 10 11/02/2020   NA 134 (L) 11/02/2020   K 3.7 11/02/2020   CL 108 11/02/2020   CO2 21 (L) 11/02/2020   Lab Results  Component Value Date   ALT 17 11/02/2020   AST 44 (H) 11/02/2020   ALKPHOS 133 (H) 11/02/2020   BILITOT 2.1 (H) 11/02/2020   Lab Results  Component Value Date   LIPASE 21 11/02/2020    IMPRESSION AND PLAN:  No problem-specific Assessment & Plan notes found for this encounter.   An After Visit Summary was printed and given to the patient.  FOLLOW UP: No follow-ups on file.  Signed:  Crissie Sickles, MD           11/04/2020

## 2020-11-05 NOTE — Telephone Encounter (Signed)
Spoke with patient, her pre-visit has been rescheduled to tomorrow, 11/06/2020 at 3:30 PM. Pre-visit nurse, Raquel Sarna has been notified that patient was added on.  Patient states that she was seen in the ED over the weekend, she received a paracentesis on 10/30/20, she states that she was leaking very bad a few days after and is in excruciating pain, pt states that she was drenched from the leakage. Patient states that they put a skin over it in the ED but she is still having the pain. Patient states that she has never had pain like this before and would like some recommendations. Please advise, thank you.

## 2020-11-05 NOTE — Telephone Encounter (Signed)
Called patient twice, her vm is full, unable to leave a message.

## 2020-11-05 NOTE — Telephone Encounter (Signed)
Lm on husband's vm to have patient return call.

## 2020-11-05 NOTE — Telephone Encounter (Signed)
[place] not sure what to make of this over the phone. She had some soft tissue edema / swelling at the paracentesis site on CT when in the ED, not sure if she just has soft tissue pain from that. Her labs looked otherwise stable the other day. She should call IR as they performed her paracentesis and let them know this happened, not sure if they want to image this again. Hopefully she is not still leaking. I have openings in the office later this week if she wishes to be evaluated in person for this otherwise.

## 2020-11-05 NOTE — Telephone Encounter (Signed)
Called patient, unable to leave a vm

## 2020-11-06 ENCOUNTER — Telehealth: Payer: Self-pay

## 2020-11-06 ENCOUNTER — Ambulatory Visit (AMBULATORY_SURGERY_CENTER): Payer: Self-pay

## 2020-11-06 ENCOUNTER — Telehealth: Payer: Self-pay | Admitting: Gastroenterology

## 2020-11-06 ENCOUNTER — Other Ambulatory Visit: Payer: Self-pay

## 2020-11-06 VITALS — Ht 67.0 in | Wt 127.0 lb

## 2020-11-06 DIAGNOSIS — I8500 Esophageal varices without bleeding: Secondary | ICD-10-CM

## 2020-11-06 DIAGNOSIS — D649 Anemia, unspecified: Secondary | ICD-10-CM

## 2020-11-06 NOTE — Telephone Encounter (Signed)
Dr. Ward Givens  Pt is due for a paracentesis d/t her cirrhosis prior to her EGD at [facility] on 11/15/19.    They understand that once the Covid test is done on 1/10 she must quarantine until after the procedure.  He will let us know if there is conflict with getting that done with the Covid test.  Most of all I wanted you to be aware of the upcoming paracentesis in case that volume of fluid present or removed could be an issue for her EGD.  Thank you.

## 2020-11-06 NOTE — Telephone Encounter (Signed)
Attempted to contact pt to notify her that a refill is not appropriate at this time. Last refill was sent 10/16/20 for 30 d/s. Pt should have 10 more days of medication left

## 2020-11-06 NOTE — Telephone Encounter (Signed)
Called patient, vm full, unable to leave a message

## 2020-11-06 NOTE — Telephone Encounter (Signed)
Dr. Havery Moros, it looks like you received an additional note from pre-visit nurse in regards to patient's paracentesis. Please advise, thank you

## 2020-11-06 NOTE — Progress Notes (Signed)
No allergies to soy or egg Pt is not on blood thinners or diet pills  Denies issues with sedation/intubation--except slow to recover from anesthesia    Denies atrial flutter/fib Denies constipation   Emmi instructions given to pt  Pt is aware of Covid safety and care partner requirements.   Pt is due to have a paracentesis fprior to next week's prior to EGD.  TE sent to Dr. Havery Moros to make him aware.   Discussed the need to

## 2020-11-06 NOTE — Telephone Encounter (Signed)
Patient scheduled appt with Dr. Anitra Lauth on 11/19/20. Patient requesting refills of pain meds until surgery which is scheduled for on 11/21/20.  Oxycodone HCl 10 MG TABS [[id number]]   [id number] [place], [place]

## 2020-11-07 ENCOUNTER — Other Ambulatory Visit: Payer: Self-pay

## 2020-11-07 DIAGNOSIS — K7460 Unspecified cirrhosis of liver: Secondary | ICD-10-CM

## 2020-11-07 DIAGNOSIS — R188 Other ascites: Secondary | ICD-10-CM

## 2020-11-07 NOTE — Telephone Encounter (Signed)
Called patient, vm is full, unable to leave message. Will attempt again later.

## 2020-11-07 NOTE — Telephone Encounter (Signed)
Called patient, vm is full unable to leave a message. Will attempt again later.

## 2020-11-07 NOTE — Telephone Encounter (Signed)
Okay if she reaccumulated that quickly, would repeat paracentesis with IR, give albumin if > 5 L, and please send cell count to rule out SBP. Thank you

## 2020-11-07 NOTE — Telephone Encounter (Signed)
Brook[place] can you please call this patient to verify what she is asking for. She just had a paracentesis on 12/29 and sounds like she has had some leakage from the site. I left some recommendations about this yesterday but looks like she did not answer your calls. Not sure if she is requesting another paracentesis so her next one won't conflict with her EGD which is scheduled for the hospital? She can get another paracentesis if she feels tight and re accumulation of fluid is an issue. Thanks

## 2020-11-07 NOTE — Telephone Encounter (Signed)
Spoke with patient and advised of previous recommendations (see alternate phone encounter), she states that she was told that she would need to have paracentesis this week prior to upcoming procedure, pt states that her abdomen does feel tight and the fluid accumulation is really bad. Advised patient that I will call her back with next steps and will also send her information via My Chart. Please advise on paracentesis, thank you.

## 2020-11-07 NOTE — Telephone Encounter (Signed)
Thank you. Paracentesis should not be an issue in regards to her EGD, can proceed with it. Thanks

## 2020-11-07 NOTE — Telephone Encounter (Signed)
Pt informed of following message

## 2020-11-07 NOTE — Telephone Encounter (Signed)
Attempted to contact pt to notify her that a refill is not appropriate at this time. Last refill was sent 10/16/20 for 30 d/s. Pt should have enough until the 16th of this month.

## 2020-11-07 NOTE — Telephone Encounter (Signed)
Spoke with patient, see alternate phone note for more information.

## 2020-11-08 ENCOUNTER — Telehealth: Payer: Self-pay | Admitting: Gastroenterology

## 2020-11-08 NOTE — Telephone Encounter (Signed)
Maureen Ralphs, Ezzard Flax Y 9 minutes ago (3:26 PM)     pt husband calling states she can barley move without assistance.Voice is very low; weak.. pt is experiencing chronic abd pains, abd is extended.. Please advisePlease advise   Incoming call

## 2020-11-08 NOTE — Telephone Encounter (Signed)
Patient called in with husband on the line, he states that her abdomen is so extended that she can barely move and he needs to know what to do. Advised that if her abdomen is that extended she can't wait until Tuesday for her appointment, advised that she will need to go through the ED so that she can be evaluated, advised that if she does not go tot the ED that the fluid may begin to cause other issues such as SOB. Patient also states that she received a call from [facility] in regards to her procedure, advised that her procedure is still scheduled and her COVID test is on Monday. Patient and husband verbalized understanding of all information and had no concerns at the end of the call.

## 2020-11-08 NOTE — Telephone Encounter (Signed)
See alternate phone note for more information

## 2020-11-08 NOTE — Telephone Encounter (Signed)
Called to schedule paracentesis for patient, no available appointments this week. Next available appointment for a paracentesis on 11/12/2020 at 1 PM, patient will need to arrive at 12:45 PM.   Left detailed message with appointment information, asked that patient return my call to discuss. Will send My Chart message as well.

## 2020-11-11 ENCOUNTER — Telehealth: Payer: Self-pay | Admitting: Gastroenterology

## 2020-11-11 ENCOUNTER — Other Ambulatory Visit (HOSPITAL_COMMUNITY): Payer: Medicare HMO

## 2020-11-11 ENCOUNTER — Ambulatory Visit: Payer: Medicare HMO | Admitting: Gastroenterology

## 2020-11-11 ENCOUNTER — Other Ambulatory Visit (HOSPITAL_COMMUNITY)
Admission: RE | Admit: 2020-11-11 | Discharge: 2020-11-11 | Disposition: A | Discharge status: Home / Self Care | Payer: Medicare HMO | Source: Ambulatory Visit | Attending: Gastroenterology | Admitting: Gastroenterology

## 2020-11-11 DIAGNOSIS — Z01812 Encounter for preprocedural laboratory examination: Secondary | ICD-10-CM | POA: Insufficient documentation

## 2020-11-11 DIAGNOSIS — Z20822 Contact with and (suspected) exposure to covid-19: Secondary | ICD-10-CM | POA: Diagnosis not present

## 2020-11-11 NOTE — Telephone Encounter (Signed)
Spoke with patient, advised that the latest appt available for her COVID test today is 3:10 PM. She states that she did not go to the ED over the weekend for paracentesis and her belly hurts really bad. Advised that she is still scheduled for paracentesis tomorrow at 1 PM, she will need to arrive at 12:45 PM. Advised that she wear her mask and keep her hands really clean. Patient verbalized understanding of all information and had no other concerns at the end of the call.

## 2020-11-11 NOTE — Telephone Encounter (Signed)
See 11/11/2020 telephone encounter for more information

## 2020-11-11 NOTE — Telephone Encounter (Signed)
Left detailed message for patient letting her know that we have not ordered any blood work but she is scheduled for her COVID screening test today, I provided her with the COVID testing site address, advised that she is also scheduled for paracentesis tomorrow if she still needs it. Does not look like she went to the ED over the weekend. Advised her to call me back if she had any further questions or concerns.

## 2020-11-11 NOTE — Telephone Encounter (Signed)
Alphonse Guild routed conversation to You 6 minutes ago (1:51 PM)   Alphonse Guild 6 minutes ago (1:51 PM)      Patient called asking if she can go at about 4 pm for her Covid test      Documentatin

## 2020-11-11 NOTE — Telephone Encounter (Signed)
Patient called asking if she can go at about 4 pm for her Covid test

## 2020-11-11 NOTE — Telephone Encounter (Signed)
Patient calling to ask if she needs to go have any labs done please advise

## 2020-11-12 ENCOUNTER — Other Ambulatory Visit: Payer: Self-pay

## 2020-11-12 ENCOUNTER — Ambulatory Visit (HOSPITAL_COMMUNITY)
Admission: RE | Admit: 2020-11-12 | Discharge: 2020-11-12 | Disposition: A | Discharge status: Home / Self Care | Payer: Medicare HMO | Source: Ambulatory Visit | Attending: Gastroenterology | Admitting: Gastroenterology

## 2020-11-12 ENCOUNTER — Telehealth: Payer: Self-pay | Admitting: Gastroenterology

## 2020-11-12 DIAGNOSIS — K7460 Unspecified cirrhosis of liver: Secondary | ICD-10-CM | POA: Diagnosis present

## 2020-11-12 DIAGNOSIS — R188 Other ascites: Secondary | ICD-10-CM | POA: Diagnosis not present

## 2020-11-12 HISTORY — PX: IR PARACENTESIS: IMG2679

## 2020-11-12 LAB — BODY FLUID CELL COUNT WITH DIFFERENTIAL
Eos, Fluid: 0 %
Lymphs, Fluid: 7 %
Monocyte-Macrophage-Serous Fluid: 67 % (ref 50–90)
Neutrophil Count, Fluid: 26 % — ABNORMAL HIGH (ref 0–25)
Total Nucleated Cell Count, Fluid: 38 cu mm (ref 0–1000)

## 2020-11-12 LAB — SARS CORONAVIRUS 2 (TAT 6-24 HRS): SARS Coronavirus 2: NEGATIVE

## 2020-11-12 MED ORDER — ALBUMIN HUMAN 25 % IV SOLN
75.0000 g | Freq: Once | INTRAVENOUS | Status: AC
  Administered 2020-11-12: 75 g via INTRAVENOUS

## 2020-11-12 MED ORDER — LIDOCAINE HCL 1 % IJ SOLN
INTRAMUSCULAR | Status: DC | PRN
  Administered 2020-11-12: 10 mL

## 2020-11-12 MED ORDER — LIDOCAINE HCL 1 % IJ SOLN
INTRAMUSCULAR | Status: AC
  Filled 2020-11-12: qty 20

## 2020-11-12 MED ORDER — ALBUMIN HUMAN 25 % IV SOLN
INTRAVENOUS | Status: AC
  Filled 2020-11-12: qty 300

## 2020-11-12 NOTE — Procedures (Signed)
PROCEDURE SUMMARY:  Successful image-guided paracentesis from the right lateral abdomen.  Yielded 10.6 liters of clear yellow fluid.  No immediate complications.  EBL = 0 mL. Patient tolerated well.   Specimen was sent for labs.  Please see imaging section of Epic for full dictation.   Claris Pong Eward Rutigliano PA-C 11/12/2020 1:39 PM

## 2020-11-12 NOTE — Telephone Encounter (Signed)
Patient calling to ask if she can start taking the Lasix medication again please advise

## 2020-11-12 NOTE — Telephone Encounter (Signed)
[place] I will see her on Thursday for her EGD and talk with her at that time given there have been issues with this in the past. Thanks

## 2020-11-12 NOTE — Telephone Encounter (Signed)
Spoke with patient, she states that she would like to know if she should restart her Lasix. Advised patient that if we restart the diuretics she would be coming in frequently for lab work, I asked her had her orthopedic issue had resolved (smashed her heel), she states that its doing better but not completely healed, advised that she will probably have more frequent urination as well, she states that she will get her a chair so that she can get to the restroom if necessary. Please advise, thank you.

## 2020-11-13 ENCOUNTER — Inpatient Hospital Stay (HOSPITAL_COMMUNITY): Admission: RE | Admit: 2020-11-13 | Payer: Medicare HMO | Source: Ambulatory Visit

## 2020-11-13 NOTE — Telephone Encounter (Signed)
Called patient twice, vm is full, unable to leave a voicemail at this time.

## 2020-11-14 ENCOUNTER — Encounter (HOSPITAL_COMMUNITY)
Admission: RE | Disposition: A | Discharge status: Home / Self Care | Payer: Self-pay | Source: Home / Self Care | Attending: Gastroenterology

## 2020-11-14 ENCOUNTER — Ambulatory Visit (HOSPITAL_COMMUNITY)
Admission: RE | Admit: 2020-11-14 | Discharge: 2020-11-14 | Disposition: A | Discharge status: Home / Self Care | Payer: Medicare HMO | Attending: Gastroenterology | Admitting: Gastroenterology

## 2020-11-14 ENCOUNTER — Ambulatory Visit (HOSPITAL_COMMUNITY): Payer: Medicare HMO | Admitting: Registered Nurse

## 2020-11-14 ENCOUNTER — Other Ambulatory Visit: Payer: Self-pay

## 2020-11-14 ENCOUNTER — Encounter (HOSPITAL_COMMUNITY): Payer: Self-pay | Admitting: Gastroenterology

## 2020-11-14 DIAGNOSIS — I959 Hypotension, unspecified: Secondary | ICD-10-CM | POA: Insufficient documentation

## 2020-11-14 DIAGNOSIS — K319 Disease of stomach and duodenum, unspecified: Secondary | ICD-10-CM | POA: Insufficient documentation

## 2020-11-14 DIAGNOSIS — K766 Portal hypertension: Secondary | ICD-10-CM

## 2020-11-14 DIAGNOSIS — I4581 Long QT syndrome: Secondary | ICD-10-CM | POA: Insufficient documentation

## 2020-11-14 DIAGNOSIS — Z9181 History of falling: Secondary | ICD-10-CM | POA: Insufficient documentation

## 2020-11-14 DIAGNOSIS — K2289 Other specified disease of esophagus: Secondary | ICD-10-CM | POA: Insufficient documentation

## 2020-11-14 DIAGNOSIS — R188 Other ascites: Secondary | ICD-10-CM | POA: Insufficient documentation

## 2020-11-14 DIAGNOSIS — K259 Gastric ulcer, unspecified as acute or chronic, without hemorrhage or perforation: Secondary | ICD-10-CM | POA: Diagnosis not present

## 2020-11-14 DIAGNOSIS — Z8719 Personal history of other diseases of the digestive system: Secondary | ICD-10-CM | POA: Diagnosis not present

## 2020-11-14 DIAGNOSIS — K449 Diaphragmatic hernia without obstruction or gangrene: Secondary | ICD-10-CM | POA: Diagnosis not present

## 2020-11-14 DIAGNOSIS — K3189 Other diseases of stomach and duodenum: Secondary | ICD-10-CM | POA: Diagnosis not present

## 2020-11-14 DIAGNOSIS — K7460 Unspecified cirrhosis of liver: Secondary | ICD-10-CM | POA: Diagnosis not present

## 2020-11-14 HISTORY — PX: BIOPSY: SHX5522

## 2020-11-14 HISTORY — PX: ESOPHAGOGASTRODUODENOSCOPY: SHX1529

## 2020-11-14 HISTORY — PX: ESOPHAGOGASTRODUODENOSCOPY (EGD) WITH PROPOFOL: SHX5813

## 2020-11-14 LAB — PATHOLOGIST SMEAR REVIEW

## 2020-11-14 SURGERY — ESOPHAGOGASTRODUODENOSCOPY (EGD) WITH PROPOFOL
Anesthesia: Monitor Anesthesia Care

## 2020-11-14 MED ORDER — PROPOFOL 1000 MG/100ML IV EMUL
INTRAVENOUS | Status: AC
  Filled 2020-11-14: qty 100

## 2020-11-14 MED ORDER — LACTATED RINGERS IV SOLN
INTRAVENOUS | Status: DC | PRN
Start: 2020-11-14 — End: 2020-11-14

## 2020-11-14 MED ORDER — NYSTATIN 100000 UNIT/ML MT SUSP: 5.0000 mL | Freq: Four times a day (QID) | OROMUCOSAL | 0 refills | Status: DC

## 2020-11-14 MED ORDER — PROPOFOL 500 MG/50ML IV EMUL
INTRAVENOUS | Status: DC | PRN
  Administered 2020-11-14: 130 ug/kg/min via INTRAVENOUS

## 2020-11-14 MED ORDER — LACTATED RINGERS IV SOLN: Freq: Once | INTRAVENOUS | Status: AC

## 2020-11-14 MED ORDER — OMEPRAZOLE 40 MG PO CPDR: 40.0000 mg | DELAYED_RELEASE_CAPSULE | Freq: Two times a day (BID) | ORAL | 3 refills | Status: DC

## 2020-11-14 MED ORDER — PROPOFOL 10 MG/ML IV BOLUS
INTRAVENOUS | Status: DC | PRN
  Administered 2020-11-14 (×2): 10 mg via INTRAVENOUS

## 2020-11-14 SURGICAL SUPPLY — 14 items

## 2020-11-14 NOTE — Telephone Encounter (Signed)
Patient was seen today at [facility] for EGD with Dr. Havery Moros.

## 2020-11-14 NOTE — Op Note (Addendum)
[facility] Patient Name: Megan Roberson Procedure Date: 11/14/2020 MRN: [id number] Attending MD: Carlota Raspberry. Deaundre Allston , MD Date of Birth: 1956/04/14 CSN: [id number] Age: 65 Admit Type: Outpatient Procedure:                Upper GI endoscopy Indications:              Cirrhosis rule out esophageal varices - not a good                            candidate for beta blockade given refractory                            ascites, screening for varices with consideration                            for banding if present, history of                            thrombocytopenia, anemia Providers:                Carlota Raspberry. Havery Moros, MD, Cleda Daub, RN, Elspeth Cho Tech., Technician, Courtney Heys [place], CRNA Referring MD:              Medicines:                Monitored Anesthesia Care Complications:            No immediate complications. Estimated blood loss:                            Minimal. Estimated Blood Loss:     Estimated blood loss was minimal. Procedure:                Pre-Anesthesia Assessment:                           - Prior to the procedure, a History and Physical                            was performed, and patient medications and                            allergies were reviewed. The patient's tolerance of                            previous anesthesia was also reviewed. The risks                            and benefits of the procedure and the sedation                            options and risks were discussed with the patient.  All questions were answered, and informed consent                            was obtained. Prior Anticoagulants: The patient has                            taken no previous anticoagulant or antiplatelet                            agents. ASA Grade Assessment: III - A patient with                            severe systemic disease. After reviewing the risks                             and benefits, the patient was deemed in                            satisfactory condition to undergo the procedure.                           After obtaining informed consent, the endoscope was                            passed under direct vision. Throughout the                            procedure, the patient's blood pressure, pulse, and                            oxygen saturations were monitored continuously. The                            [id number]-[id number] ([id number]) was introduced through the                            mouth, and advanced to the second part of duodenum.                            The upper GI endoscopy was accomplished without                            difficulty. The patient tolerated the procedure                            well. Scope In: Scope Out: Findings:      Esophagogastric landmarks were identified: the Z-line was found at 35       cm. There was a small 1-2cm sliding hiatal hernia.      Diffuse, white / yellow plaques were found in the entire esophagus       grossly consistent with esophageal candidiasis.      The exam of the esophagus was otherwise normal. No obvious or large       varices noted.  One non-bleeding cratered gastric ulcer with no stigmata of bleeding was       found in the gastric antrum / pre pyloric area. The lesion was 5 mm in       largest dimension. There was nodularity and polypoid tissue both distal       and proximal to the ulcer. Biopsies were taken with a cold forceps for       histology from these polypoid areas as well as from the edge of the       ulceration.      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      The exam of the stomach was otherwise normal. No gastric varices.      Diffuse congested mucosa was found in the duodenal bulb and in the       second portion of the duodenum. The duodenal sweep was quite edematous       and difficult to traverse, visualization was poor in this area.  Contact       friability was noted with simply passing the endoscope.      The exam of the duodenum was otherwise normal. Impression:               - Esophagogastric landmarks identified.                           - Esophageal plaques were found, consistent with                            candidiasis.                           - No obvious or significant varices noted.                           - Non-bleeding gastric ulcer with significant                            nodularity and associated polypoid tissue. Biopsied.                           - Portal hypertensive gastropathy.                           - No gastric varices                           - Congested duodenal mucosa as outlined from portal                            hypertension. Moderate Sedation:      No moderate sedation, case performed with MAC Recommendation:           - Patient has a contact number available for                            emergencies. The signs and symptoms of potential                            delayed complications were  discussed with the                            patient. Return to normal activities tomorrow.                            Written discharge instructions were provided to the                            patient.                           - Resume previous diet.                           - Continue present medications.                           - Increase omeprazole to 39m twice daily                           - Ensure not taking any NSAIDs                           - Normally would recommend fluconazole for                            treatment of esophageal candidiasis however the                            patient has a prolonged QTc. In this light will                            recommend 2 week course of Nystatin swallowed QID                            for 14 days                           - Await pathology results. Follow up EGD                            recommended to evaluate  for mucosal healing Procedure Code(s):        --- Professional ---                           [phone] Esophagogastroduodenoscopy, flexible,                            transoral; with biopsy, single or multiple Diagnosis Code(s):        --- Professional ---                           K22.9, Disease of esophagus, unspecified                           K25.9,  Gastric ulcer, unspecified as acute or                            chronic, without hemorrhage or perforation                           K76.6, Portal hypertension                           K31.89, Other diseases of stomach and duodenum                           K74.60, Unspecified cirrhosis of liver CPT copyright 2019 American Medical Association. All rights reserved. The codes documented in this report are preliminary and upon coder review may  be revised to meet current compliance requirements. Remo Lipps P. Kendarrius Tanzi, MD 11/14/2020 1:17:15 PM This report has been signed electronically. Number of Addenda: 0

## 2020-11-14 NOTE — Anesthesia Preprocedure Evaluation (Addendum)
Anesthesia Evaluation  Patient identified by MRN, date of birth, ID band Patient awake    Reviewed: Allergy & Precautions, NPO status , Patient's Chart, lab work & pertinent test results  Airway Mallampati: II  TM Distance: >3 FB Neck ROM: Full    Dental  (+) Edentulous Upper, Poor Dentition, Dental Advisory Given   Pulmonary neg pulmonary ROS,    Pulmonary exam normal breath sounds clear to auscultation       Cardiovascular negative cardio ROS Normal cardiovascular exam Rhythm:Regular Rate:Normal  TTE 2021 1. Extremely poor acoustic windows limit study even with Definity No parasternal window. Overall LVEF is normal. Not all wall segments visualized. The left ventricle has normal function.  2. Right ventricular systolic function is normal. The right ventricular size is normal.  3. The mitral valve is grossly normal. Trivial mitral valve  regurgitation.  4. Extremely poor acoustic windows limit study.    Neuro/Psych PSYCHIATRIC DISORDERS Anxiety Depression negative neurological ROS     GI/Hepatic negative GI ROS, (+) Cirrhosis   Esophageal Varices and ascites  substance abuse  alcohol use,   Endo/Other  Hypothyroidism   Renal/GU negative Renal ROS  negative genitourinary   Musculoskeletal  (+) Arthritis ,   Abdominal   Peds  Hematology  (+) Blood dyscrasia (Hgb 8.4, plt 44), anemia ,   Anesthesia Other Findings   Reproductive/Obstetrics                            Anesthesia Physical Anesthesia Plan  ASA: III  Anesthesia Plan: MAC   Post-op Pain Management:    Induction: Intravenous  PONV Risk Score and Plan: 2 and Propofol infusion and Treatment may vary due to age or medical condition  Airway Management Planned: Natural Airway  Additional Equipment:   Intra-op Plan:   Post-operative Plan:   Informed Consent: I have reviewed the patients History and Physical, chart,  labs and discussed the procedure including the risks, benefits and alternatives for the proposed anesthesia with the patient or authorized representative who has indicated his/her understanding and acceptance.     Dental advisory given  Plan Discussed with: CRNA  Anesthesia Plan Comments:         Anesthesia Quick Evaluation

## 2020-11-14 NOTE — Discharge Instructions (Signed)
YOU HAD AN ENDOSCOPIC PROCEDURE TODAY: Refer to the procedure report and other information in the discharge instructions given to you for any specific questions about what was found during the examination. If this information does not answer your questions, please call [place] office at [phone] to clarify.   YOU SHOULD EXPECT: Some feelings of bloating in the abdomen. Passage of more gas than usual. Walking can help get rid of the air that was put into your GI tract during the procedure and reduce the bloating. If you had a lower endoscopy (such as a colonoscopy or flexible sigmoidoscopy) you may notice spotting of blood in your stool or on the toilet paper. Some abdominal soreness may be present for a day or two, also.  DIET: Your first meal following the procedure should be a light meal and then it is ok to progress to your normal diet. A half-sandwich or bowl of soup is an example of a good first meal. Heavy or fried foods are harder to digest and may make you feel nauseous or bloated. Drink plenty of fluids but you should avoid alcoholic beverages for 24 hours. If you had a esophageal dilation, please see attached instructions for diet.    ACTIVITY: Your care partner should take you home directly after the procedure. You should plan to take it easy, moving slowly for the rest of the day. You can resume normal activity the day after the procedure however YOU SHOULD NOT DRIVE, use power tools, machinery or perform tasks that involve climbing or major physical exertion for 24 hours (because of the sedation medicines used during the test).   SYMPTOMS TO REPORT IMMEDIATELY: A gastroenterologist can be reached at any hour. Please call [phone]  for any of the following symptoms:   Following lower endoscopy (colonoscopy, flexible sigmoidoscopy) Excessive amounts of blood in the stool  Significant tenderness, worsening of abdominal pains  Swelling of the abdomen that is new, acute  Fever of 100  or higher   Following upper endoscopy (EGD, EUS, ERCP, esophageal dilation) Vomiting of blood or coffee ground material  New, significant abdominal pain  New, significant chest pain or pain under the shoulder blades  Painful or persistently difficult swallowing  New shortness of breath  Black, tarry-looking or red, bloody stools  FOLLOW UP:  If any biopsies were taken you will be contacted by phone or by letter within the next 1-3 weeks. Call [phone]  if you have not heard about the biopsies in 3 weeks.  Please also call with any specific questions about appointments or follow up tests.YOU HAD AN ENDOSCOPIC PROCEDURE TODAY: Refer to the procedure report and other information in the discharge instructions given to you for any specific questions about what was found during the examination. If this information does not answer your questions, please call [place] office at [phone] to clarify.   YOU SHOULD EXPECT: Some feelings of bloating in the abdomen. Passage of more gas than usual. Walking can help get rid of the air that was put into your GI tract during the procedure and reduce the bloating. If you had a lower endoscopy (such as a colonoscopy or flexible sigmoidoscopy) you may notice spotting of blood in your stool or on the toilet paper. Some abdominal soreness may be present for a day or two, also.  DIET: Your first meal following the procedure should be a light meal and then it is ok to progress to your normal diet. A half-sandwich or bowl of soup is an example  of a good first meal. Heavy or fried foods are harder to digest and may make you feel nauseous or bloated. Drink plenty of fluids but you should avoid alcoholic beverages for 24 hours. If you had a esophageal dilation, please see attached instructions for diet.    ACTIVITY: Your care partner should take you home directly after the procedure. You should plan to take it easy, moving slowly for the rest of the day. You can resume  normal activity the day after the procedure however YOU SHOULD NOT DRIVE, use power tools, machinery or perform tasks that involve climbing or major physical exertion for 24 hours (because of the sedation medicines used during the test).   SYMPTOMS TO REPORT IMMEDIATELY: A gastroenterologist can be reached at any hour. Please call [phone]  for any of the following symptoms:   Following upper endoscopy (EGD, EUS, ERCP, esophageal dilation) Vomiting of blood or coffee ground material  New, significant abdominal pain  New, significant chest pain or pain under the shoulder blades  Painful or persistently difficult swallowing  New shortness of breath  Black, tarry-looking or red, bloody stools  FOLLOW UP:  If any biopsies were taken you will be contacted by phone or by letter within the next 1-3 weeks. Call [phone]  if you have not heard about the biopsies in 3 weeks.  Please also call with any specific questions about appointments or follow up tests.

## 2020-11-14 NOTE — Interval H&P Note (Signed)
History and Physical Interval Note:  11/14/2020 12:34 PM  Megan Roberson  has presented today for surgery, with the diagnosis of esophageal varices.  The various methods of treatment have been discussed with the patient and family. After consideration of risks, benefits and other options for treatment, the patient has consented to  Procedure(s): ESOPHAGEAL BANDING (N/A) ESOPHAGOGASTRODUODENOSCOPY (EGD) WITH PROPOFOL (N/A) as a surgical intervention.  The patient's history has been reviewed, patient examined, no change in status, stable for surgery.  I have reviewed the patient's chart and labs.  Questions were answered to the patient's satisfaction.     [place]

## 2020-11-14 NOTE — Anesthesia Postprocedure Evaluation (Signed)
Anesthesia Post Note  Patient: Terianne Thaker  Procedure(s) Performed: ESOPHAGOGASTRODUODENOSCOPY (EGD) WITH PROPOFOL (N/A ) BIOPSY     Patient location during evaluation: Endoscopy Anesthesia Type: MAC Level of consciousness: awake and alert Pain management: pain level controlled Vital Signs Assessment: post-procedure vital signs reviewed and stable Respiratory status: spontaneous breathing, nonlabored ventilation, respiratory function stable and patient connected to nasal cannula oxygen Cardiovascular status: blood pressure returned to baseline and stable Postop Assessment: no apparent nausea or vomiting Anesthetic complications: no   No complications documented.  Last Vitals:  Vitals:   11/14/20 1320 11/14/20 1330  BP: 103/60 105/63  Pulse: 67 68  Resp: (!) 22 18  Temp:    SpO2: 98% 98%    Last Pain:  Vitals:   11/14/20 1320  TempSrc:   PainSc: 6                  Sherrick Araki L Christobal Morado

## 2020-11-14 NOTE — Transfer of Care (Signed)
Immediate Anesthesia Transfer of Care Note  Patient: Megan Roberson  Procedure(s) Performed: ESOPHAGOGASTRODUODENOSCOPY (EGD) WITH PROPOFOL (N/A ) BIOPSY  Patient Location: PACU and Endoscopy Unit  Anesthesia Type:MAC  Level of Consciousness: awake, alert , oriented and patient cooperative  Airway & Oxygen Therapy: Patient Spontanous Breathing and Patient connected to face mask oxygen  Post-op Assessment: Report given to RN, Post -op Vital signs reviewed and stable and Patient moving all extremities  Post vital signs: Reviewed and stable  Last Vitals:  Vitals Value Taken Time  BP 103/64 11/14/20 1300  Temp    Pulse 58 11/14/20 1300  Resp 19 11/14/20 1300  SpO2 100 % 11/14/20 1300    Last Pain:  Vitals:   11/14/20 1131  TempSrc: Oral  PainSc: 7          Complications: No complications documented.

## 2020-11-14 NOTE — H&P (Signed)
HPI :  65 y/o female with decompensated cirrhosis, history of esophageal varices / ascites. Not a candidate for beta blockade currently due to refractory ascites, history of falls / low BP. Here for surveillance EGD to reassess varices and consideration for banding. She has chronic thrombocytopenia which has ranged 20s-70s, most recently 44. INR 1.8. Ascites being managed with large volume paracentesis. Not a candidate for transplant per hepatology, not a good candidate for TIPS either with hepatic encephalopathy. She denies complaints today - no cardiopulmonary symptoms, has baseline ascites which is reaccumulating, not on diuretics due to falls (cannot get to bathroom in time)  Last EGD 03/15/18 - small varices in the esophagus, gastritis, small gastric ulcer  Past Medical History:  Diagnosis Date  . Anxiety and depression   . Cholelithiasis without cholecystitis   . Chronic abdominal pain   . Chronic nausea    + abd pain  . Cirrhosis of liver with ascites (HCC)    Alc cirrhosis; hx of multiple abd paracentesis, +esoph varices (+banding), pancytopenia, hx of hepatic enceph, hx of hydrothorax  . Heart murmur    Echo 10/06/17 [facility]): Normal LV/RV size, basal septal hypertophy-normal variant, LVEF 65-70%, normal LV/RV function, est RAP 5 mmHg, no sign valvular stenosis or regurg--trace MR/TR/PR  . History of abnormal cervical Pap smear   . History of pneumonia   . Hypothyroidism   . Insomnia   . Nephrolithiasis    right renal pelvis, 13-71m nonobst stone  . Neuropathy    unclear hx  . Osteoarthritis, multiple sites    primarily hands and knees  . Osteoporosis 02/09/2018  . Ovarian cyst    bilat.  Stable on MRI pelvis 11/2019 and CT abd/pelv 09/2020--no f/u imaging indicated.  . [place]   . Splenomegaly   . Thrombocytopenia ([place] 12/01/2019   severe, hx of plts down into 30s     Past Surgical History:  Procedure Laterality Date  . AUGMENTATION MAMMAPLASTY Bilateral     20 years ago  . BIOPSY  03/15/2018   gastric.  Procedure: BIOPSY;  Surgeon: AYetta Flock MD;  [facility];  Service: Gastroenterology;;  Gastric  . COLONOSCOPY  2019  . COLONOSCOPY WITH ESOPHAGOGASTRODUODENOSCOPY (EGD) AND ESOPHAGEAL DILATION (ED)    . COLONOSCOPY WITH PROPOFOL N/A 03/15/2018   Redundant colon, severe looping, very edematous colon c/w portal HTN.  Procedure: COLONOSCOPY WITH PROPOFOL;  Surgeon: AYetta Flock MD;  [facility];  Service: Gastroenterology;  Laterality: N/A;  . ESOPHAGOGASTRODUODENOSCOPY (EGD) WITH PROPOFOL N/A 03/15/2018   Gastritis, small gastric ulcer. Procedure: ESOPHAGOGASTRODUODENOSCOPY (EGD) WITH PROPOFOL;  Surgeon: AYetta Flock MD;  Location: [facility];  Service: Gastroenterology;  Laterality: N/A;  . ESOPHAGOGASTRODUODENOSCOPY W/ BANDING     Varices  . FRACTURE SURGERY Left    x 2 L elbow and hip left   . HARDWARE REMOVAL Left 09/17/2017   Procedure: HARDWARE REMOVAL LEFT OLECRANON;  Surgeon: HAltamese  MD;  [place] [facility]  Service: Orthopedics;  Laterality: Left;  . IR PARACENTESIS  11/18/2017  . IR PARACENTESIS  11/26/2017  . IR PARACENTESIS  03/14/2018  . IR PARACENTESIS  08/08/2020  . IR PARACENTESIS  09/23/2020  . IR PARACENTESIS  10/22/2020  . IR PARACENTESIS  10/30/2020  . IR PARACENTESIS  11/12/2020  . KNEE SURGERY Right    open incision for ligaments  . ORIF ELBOW FRACTURE Left 04/30/2017   Procedure: REVISION OPEN REDUCTION INTERNAL FIXATION (ORIF) ELBOW/OLECRANON FRACTURE;  Surgeon: HAltamese  MD;  Location: [place];  Service: Orthopedics;  Laterality: Left;  . ORIF HIP FRACTURE Left   . REPAIR EXTENSOR TENDON Left 12/29/2019   Procedure: LEFT LONG AND LEFT RING FRINGER EXTENSOR REALIGNAMENT WITH POSSIBLE TENDON TRANSFER;  Surgeon: Charlotte Crumb, MD;  Location: [place];  Service: Orthopedics;  Laterality: Left;  . virtual colonoscopy  04/24/2019   no polyps, no mass, no apple core  lesion, no stricture   Family History  Adopted: Yes  Problem Relation Age of Onset  . Alcohol abuse Daughter    Social History   Tobacco Use  . Smoking status: Never Smoker  . Smokeless tobacco: Never Used  Vaping Use  . Vaping Use: Never used  Substance Use Topics  . Alcohol use: No  . Drug use: No   No current facility-administered medications for this encounter.   Facility-Administered Medications Ordered in Other Encounters  Medication Dose Route Frequency Provider Last Rate Last Admin  . lactated ringers infusion   Intravenous Continuous PRN Arm[place], Courtney Heys, CRNA   New Bag at 11/14/20 1142   Allergies  Allergen Reactions  . Penicillins Swelling and Other (See Comments)    FACIAL SWELLING Did it involve swelling of the face/tongue/throat, SOB, or low BP? Yes Did it involve sudden or severe rash/hives, skin peeling, or any reaction on the inside of your mouth or nose? No Did you need to seek medical attention at a hospital or doctor's office? Unknown When did it last happen?30 years If all above answers are "NO", may proceed with cephalosporin use.   . Tylenol [Acetaminophen] Nausea Only  . Sulfa Antibiotics Itching and Rash  . Tramadol Swelling and Rash     Review of Systems: All systems reviewed and negative except where noted in HPI.    Lab Results  Component Value Date   WBC 4.8 11/02/2020   HGB 8.4 (L) 11/02/2020   HCT 29.2 (L) 11/02/2020   MCV 92.1 11/02/2020   PLT 44 (L) 11/02/2020    Lab Results  Component Value Date   CREATININE 0.57 11/02/2020   BUN 10 11/02/2020   NA 134 (L) 11/02/2020   K 3.7 11/02/2020   CL 108 11/02/2020   CO2 21 (L) 11/02/2020    Lab Results  Component Value Date   ALT 17 11/02/2020   AST 44 (H) 11/02/2020   ALKPHOS 133 (H) 11/02/2020   BILITOT 2.1 (H) 11/02/2020   Lab Results  Component Value Date   INR 1.8 (H) 11/02/2020   INR 1.6 (H) 10/30/2020   INR 1.8 (H) 09/08/2020     Physical Exam: BP  113/70   Pulse 71   Temp 98.5 F (36.9 C) (Oral)   Resp 17   Ht 5' 7"  (1.702 m)   Wt 54.4 kg   SpO2 100%   BMI 18.79 kg/m  Constitutional: Pleasant, female in no acute distress. Cardiovascular: Normal rate, regular rhythm.  Pulmonary/chest: Effort normal and breath sounds normal.  Abdominal: Soft, distended with ascites.  Neurological: Alert and oriented to person place and time. Psychiatric: Normal mood and affect. Behavior is normal.   ASSESSMENT AND PLAN: 65 y/o female here for EGD to survey varices and consider banding if she has high risk varices. Difficult situation. She is not a candidate for beta blockade right now. Main therapy for large varices to prevent bleeding would be band ligation. She does have chronic thromobcytopenia / coagulopathy at baseline however, and discussed that she is at higher than average risk for bleeding with an  intervention. Discussed options with her, will plan on EGD today to evaluate her upper tract. If she has high risk varices that would benefit from band ligation we will consider that, while if only small would continue to observe. She understands risks / benefits of EGD, band ligation, anesthesia, and wants to proceed. Further recommendations pending the results.  Cambria Cellar, MD [facility]

## 2020-11-15 ENCOUNTER — Encounter: Payer: Self-pay | Admitting: Family Medicine

## 2020-11-15 ENCOUNTER — Other Ambulatory Visit: Payer: Self-pay

## 2020-11-15 ENCOUNTER — Telehealth: Payer: Self-pay | Admitting: Gastroenterology

## 2020-11-15 LAB — SURGICAL PATHOLOGY

## 2020-11-15 MED ORDER — NYSTATIN 100000 UNIT/ML MT SUSP: 5.0000 mL | Freq: Four times a day (QID) | OROMUCOSAL | 0 refills | Status: AC

## 2020-11-15 NOTE — Progress Notes (Signed)
Pharmacy did not receive the prescription from yesterday, re-order.

## 2020-11-15 NOTE — Progress Notes (Signed)
Msg left

## 2020-11-15 NOTE — Telephone Encounter (Signed)
Called [facility] pharmacy, they had not received Nystatin prescription that was sent in yesterday. Re-ordered the medication.   Spoke with patient and she is aware, advised her to give the pharmacy some time to get it prepared. Patient verbalized understanding and had no concerns at the end of the call.

## 2020-11-19 ENCOUNTER — Ambulatory Visit: Payer: Medicare HMO | Admitting: Family Medicine

## 2020-11-19 ENCOUNTER — Other Ambulatory Visit: Payer: Self-pay | Admitting: Family Medicine

## 2020-11-19 ENCOUNTER — Telehealth: Payer: Self-pay

## 2020-11-19 ENCOUNTER — Encounter (HOSPITAL_COMMUNITY): Payer: Self-pay | Admitting: Gastroenterology

## 2020-11-19 DIAGNOSIS — K7460 Unspecified cirrhosis of liver: Secondary | ICD-10-CM

## 2020-11-19 DIAGNOSIS — R188 Other ascites: Secondary | ICD-10-CM

## 2020-11-19 NOTE — Telephone Encounter (Signed)
Patient has been scheduled for next available appointment for a paracentesis at [facility] on Friday, 11/22/2020 at 2 PM, patient is to arrive at 1:45 PM.   Spoke with patient and she is aware of appt , advised that she can call central scheduling and see if they have had any cancellations but that was the next available appointment that I was provided. Patient verbalized understanding and had no other concerns at the end of the call.

## 2020-11-19 NOTE — Telephone Encounter (Signed)
Faythe Ghee can proceed with large volume paracentesis, give albumin if > 5 L removed, send cell count to rule out SBP. She should be on a schedule for having this done every 2 weeks, but has needed it more frequently recently. We will see how she does after this one and may consider moving up intervals more frequently. Thank you

## 2020-11-19 NOTE — Telephone Encounter (Signed)
Spoke with patient to scheduled follow up appointment per 11/14/20 pathology result note. Patient has been scheduled for a follow up on 01/28/21 at 2:10 PM. Patient also states that she is "having triplets", she states that her belly is really swollen and painful. She is requesting a paracentesis, she also states that her left thigh is swollen from catheter that was previously placed- she is unsure if this is related. Please advise on paracentesis, thank you.

## 2020-11-19 NOTE — Addendum Note (Signed)
Addended by: Yevette Edwards on: 11/19/2020 12:35 PM   Modules accepted: Orders

## 2020-11-19 NOTE — Telephone Encounter (Signed)
Patient requesting refills of ondansetron and oxycodone, she is currently out of these meds. States she spoke to triage nurse earlier about these. Please send to same pharmacy. Patient also stated her surgical site is swollen. Transferred her to triage nurse line for swelling issue.

## 2020-11-20 ENCOUNTER — Ambulatory Visit (HOSPITAL_COMMUNITY)
Admission: RE | Admit: 2020-11-20 | Discharge: 2020-11-20 | Disposition: A | Discharge status: Home / Self Care | Payer: Medicare HMO | Source: Ambulatory Visit | Attending: Gastroenterology | Admitting: Gastroenterology

## 2020-11-20 ENCOUNTER — Other Ambulatory Visit: Payer: Self-pay

## 2020-11-20 DIAGNOSIS — R188 Other ascites: Secondary | ICD-10-CM | POA: Diagnosis not present

## 2020-11-20 DIAGNOSIS — K7460 Unspecified cirrhosis of liver: Secondary | ICD-10-CM | POA: Diagnosis present

## 2020-11-20 HISTORY — PX: IR PARACENTESIS: IMG2679

## 2020-11-20 LAB — BODY FLUID CELL COUNT WITH DIFFERENTIAL
Eos, Fluid: 0 %
Lymphs, Fluid: 9 %
Monocyte-Macrophage-Serous Fluid: 71 % (ref 50–90)
Neutrophil Count, Fluid: 20 % (ref 0–25)
Total Nucleated Cell Count, Fluid: 50 cu mm (ref 0–1000)

## 2020-11-20 MED ORDER — ALBUMIN HUMAN 25 % IV SOLN
75.0000 g | Freq: Once | INTRAVENOUS | Status: AC
  Administered 2020-11-20: 75 g via INTRAVENOUS

## 2020-11-20 MED ORDER — ONDANSETRON 4 MG PO TBDP: 4.0000 mg | ORAL_TABLET | Freq: Three times a day (TID) | ORAL | 3 refills | Status: DC | PRN

## 2020-11-20 MED ORDER — OXYCODONE HCL 10 MG PO TABS
ORAL_TABLET | ORAL | 0 refills | Status: DC
Start: 2020-11-20 — End: 2020-12-09

## 2020-11-20 MED ORDER — LIDOCAINE HCL (PF) 1 % IJ SOLN
INTRAMUSCULAR | Status: DC | PRN
  Administered 2020-11-20: 20 mL

## 2020-11-20 MED ORDER — ALBUMIN HUMAN 25 % IV SOLN
INTRAVENOUS | Status: AC
  Filled 2020-11-20: qty 300

## 2020-11-20 MED ORDER — LIDOCAINE HCL 1 % IJ SOLN
INTRAMUSCULAR | Status: AC
  Filled 2020-11-20: qty 20

## 2020-11-20 NOTE — Procedures (Signed)
PROCEDURE SUMMARY:  Successful US guided paracentesis from right/mid abdomen.  Yielded 7.9 L of clear yellow fluid.  No immediate complications.  Pt tolerated well.   Specimen sent for labs.  EBL < 1 mL  Theresa Duty, NP 11/20/2020 2:59 PM

## 2020-11-20 NOTE — Telephone Encounter (Signed)
Pt aware rx has been sent 

## 2020-11-20 NOTE — Telephone Encounter (Signed)
[place] Day - Client TELEPHONE ADVICE RECORD [facility] Patient Name: Megan Roberson Gender: Female DOB: 09/22/1956 Age: 65 Y 2 M 2 D Return Phone Number: [phone] (Primary) Address: City/State/Zip: [place] [address] Client [facility] - Client Client Site [place] - Day Contact Type Call Who Is Calling Patient / Member / Family / Caregiver Call Type Triage / Clinical Relationship To Patient Self Return Phone Number [phone] (Primary) Chief Complaint Abdominal Pain Reason for Call Symptomatic / Request for [place] states she is needing a prescription refill for pain after she had a procedure. CBWN Caller states had paracentesis done and having stomach pain. Translation No Disp. Time Eilene Ghazi Time) Disposition Final User 11/19/2020 3:31:33 PM FINAL ATTEMPT MADE - message left Lahoma Crocker 11/19/2020 3:31:41 PM Send to RN Final Attempt Darletta Moll, RN, Zella Ball 11/19/2020 3:34:21 PM Send To Call Back Waiting For Nurse Renato Shin 11/19/2020 3:52:53 PM FINAL ATTEMPT MADE - message left Lovelace, RN, Amy 11/19/2020 4:02:42 PM FINAL ATTEMPT MADE - message left Yes Lovelace, RN, Amy Comments User: Wayne Sever, RN Date/Time Eilene Ghazi Time): 11/19/2020 3:56:02 PM Noticed that the second time the patient called was at 3:34. Should not have closed the chart yet. Will try to call the patient again in 10 minutes. User: Wayne Sever, RN Date/Time Eilene Ghazi Time): 11/19/2020 4:03:15 PM Still no answer. Left a message to call back when she is available.  [place] [facility] - Client TELEPHONE ADVICE RECORD [facility] Patient Name: Megan Roberson Gender: Female DOB: [birth year] Age: 43 Y 2 M 2 D Return Phone Number: [phone] (Primary) Address: City/State/Zip: [place] [address] Client [name] - Client Client Site [place] - Day Physician Crissie Sickles - MD Contact Type Call Who Is Calling Patient / Member / Family / Caregiver Call Type Triage / Clinical Relationship To Patient Self Return Phone Number [phone] (Primary) Chief Complaint Leg Pain Reason for Call Symptomatic / Request for [place] states she has had a parancentesis a few days ago. She has pain in her leg. Translation No Disp. Time Eilene Ghazi Time) Disposition Final User 11/19/2020 4:57:48 PM Attempt made - no message left Apolonio Schneiders 11/19/2020 5:25:19 PM FINAL ATTEMPT MADE - no message left Yes Altamease Oiler, RN, Anastasia Fiedler

## 2020-11-20 NOTE — Telephone Encounter (Signed)
Requesting: oxycodone Contract: 09/17/20 UDS:09/17/20 Last Visit:09/17/20 Next Visit:11/28/20 Last Refill:10/16/20(90,0)  RF request for zofran LOV:09/17/20 Next ov: 11/28/20 Last written:08/06/20(40,3) not originally prescribed by you.  Medications pending. Please advise, thanks.

## 2020-11-20 NOTE — Telephone Encounter (Signed)
Patient requesting pain meds and nausea meds before procedure today  Patient can be reached at [phone]

## 2020-11-21 ENCOUNTER — Inpatient Hospital Stay (HOSPITAL_COMMUNITY): Payer: Medicare HMO | Attending: Hematology | Admitting: Hematology

## 2020-11-21 LAB — PATHOLOGIST SMEAR REVIEW

## 2020-11-21 NOTE — Telephone Encounter (Signed)
Patient called in, states that she had a paracentesis yesterday and states that the fluid is already returning today, she states that she can move right now but feels like the fluid will be there by tomorrow. Patient would like to revisit restarting Lasix, she states that she would not like to have to have a paracentesis every other day. Patient states that she is willing to come in for labs and also mentions that she is still having some swelling in her left thigh. Please advise, thank you

## 2020-11-21 NOTE — Telephone Encounter (Signed)
[place] I agree that diuretics are likely to provide benefit to her and keep the fluid off however she absolutely must be compliant with lab draws if we are going to do this otherwise she is at risk for renal failure and electrolyte abnormalities which she has had in the past. I would prefer to hold off on Lasix right now given she has had significant problems with hypokalemia on that regimen.  She previously was on Aldactone monotherapy with hypokalemia.  I think we can resume Aldactone dosed at 100 mg a day right now and I would like her to come in for a BMET early next week to check her potassium levels and renal function.  If she is willing to do this we can titrate up her diuretics as tolerated and I think this will help her.  She also needs to stay on a low Na diet, if you can remind her of that. Thanks

## 2020-11-21 NOTE — Telephone Encounter (Signed)
Lm on vm for patient to return call 

## 2020-11-22 ENCOUNTER — Other Ambulatory Visit (HOSPITAL_COMMUNITY): Payer: Medicare HMO

## 2020-11-22 NOTE — Telephone Encounter (Signed)
Spoke with patient in regards to recommendations per Dr. Havery Moros as outlined below. Advised patient to begin Aldactone 100 mg daily, she is aware that she will need to come in for labs early next week, advised that I will call to remind her of lab work. Also, advised that she will need to stay on a low sodium diet as well. Patient states that she thinks that she still has the prescription for Aldactone but if she finds that she does not she will give me a call back. Patient verbalized understanding of all information and had no further concerns at the end of the call.   Lab order and reminder in epic.

## 2020-11-22 NOTE — Addendum Note (Signed)
Addended by: Yevette Edwards on: 11/22/2020 01:07 PM   Modules accepted: Orders

## 2020-11-25 ENCOUNTER — Telehealth: Payer: Self-pay

## 2020-11-25 ENCOUNTER — Other Ambulatory Visit: Payer: Self-pay

## 2020-11-25 DIAGNOSIS — R188 Other ascites: Secondary | ICD-10-CM

## 2020-11-25 DIAGNOSIS — K7460 Unspecified cirrhosis of liver: Secondary | ICD-10-CM

## 2020-11-25 NOTE — Telephone Encounter (Signed)
Patient has been scheduled for a paracentesis at [facility] on  - Wednesday, 11/27/20 at 1 PM, arrive at 12:45 PM - Thursday,12/05/20 at 1 PM, arrive at 12:45 PM - Wednesday, 12/11/20 at 1 PM, arrive at 12:45 PM - Thursday, 12/19/20 at 1 PM, arrive at 12:45 PM - Thursday, 12/26/20 at 1 PM, arrive at 12:45 PM  Lm on vm for patient to return call.

## 2020-11-25 NOTE — Telephone Encounter (Signed)
Thanks [employer]. Yes I think okay to do another paracentesis if she is willing up again that quickly. May be good to schedule her to have one weekly for the next few weeks given her recent history until we can escalate diuretic dosing. I will await her labs otherwise with additional recommendations on her diuretics. Thanks

## 2020-11-25 NOTE — Telephone Encounter (Signed)
-----   Message from Yevette Edwards, RN sent at 11/22/2020  1:07 PM EST ----- Regarding: Labs BMET, order in epic.

## 2020-11-25 NOTE — Telephone Encounter (Signed)
Spoke with patient in regards to Dr. Doyne Keel recommendations. I provided patient with her appointments for her weekly paracentesis. Patient states that she will come in for labs tomorrow. Patient verbalized understanding of all information and had no concerns at the end of the call.

## 2020-11-25 NOTE — Telephone Encounter (Signed)
Spoke with patient to remind her that she will need to come in later today or tomorrow for lab work. Patient is aware that no appt is necessary and that she can stop by at her convenience between 7:30 AM - 5 PM. Patient voiced understanding.   Patient requesting paracentesis, she states that her abdomen is full and very extended, she reports a little pain on the left side. Patient states that she has been taking Aldactone as directed since last week. Please advise on paracentesis. Thank you.

## 2020-11-26 NOTE — Telephone Encounter (Signed)
Patient called to advise she was not able to go for labs today but will go tomorrow morning.

## 2020-11-26 NOTE — Telephone Encounter (Signed)
Noted  

## 2020-11-27 ENCOUNTER — Ambulatory Visit (HOSPITAL_COMMUNITY)
Admission: RE | Admit: 2020-11-27 | Discharge: 2020-11-27 | Disposition: A | Discharge status: Home / Self Care | Payer: Medicare HMO | Source: Ambulatory Visit | Attending: Gastroenterology | Admitting: Gastroenterology

## 2020-11-27 ENCOUNTER — Other Ambulatory Visit (INDEPENDENT_AMBULATORY_CARE_PROVIDER_SITE_OTHER): Payer: Medicare HMO

## 2020-11-27 ENCOUNTER — Other Ambulatory Visit: Payer: Self-pay

## 2020-11-27 DIAGNOSIS — R188 Other ascites: Secondary | ICD-10-CM

## 2020-11-27 DIAGNOSIS — K7460 Unspecified cirrhosis of liver: Secondary | ICD-10-CM

## 2020-11-27 HISTORY — PX: IR PARACENTESIS: IMG2679

## 2020-11-27 LAB — BASIC METABOLIC PANEL
BUN: 8 mg/dL (ref 6–23)
CO2: 23 mEq/L (ref 19–32)
Calcium: 8.8 mg/dL (ref 8.4–10.5)
Chloride: 103 mEq/L (ref 96–112)
Creatinine, Ser: 0.5 mg/dL (ref 0.40–1.20)
GFR: 100.68 mL/min (ref >60.00)
Glucose, Bld: 84 mg/dL (ref 70–99)
Potassium: 3.8 mEq/L (ref 3.5–5.1)
Sodium: 135 mEq/L (ref 135–145)

## 2020-11-27 MED ORDER — ALBUMIN HUMAN 25 % IV SOLN: 50.0000 g | Freq: Once | INTRAVENOUS | Status: AC

## 2020-11-27 MED ORDER — ALBUMIN HUMAN 25 % IV SOLN
INTRAVENOUS | Status: AC
  Administered 2020-11-27: 50 g via INTRAVENOUS
  Filled 2020-11-27: qty 200

## 2020-11-27 MED ORDER — LIDOCAINE HCL (PF) 1 % IJ SOLN
INTRAMUSCULAR | Status: DC | PRN
  Administered 2020-11-27: 10 mL

## 2020-11-27 MED ORDER — LIDOCAINE HCL 1 % IJ SOLN
INTRAMUSCULAR | Status: AC
  Filled 2020-11-27: qty 20

## 2020-11-27 NOTE — Telephone Encounter (Signed)
Patient has completed lab work.

## 2020-11-27 NOTE — Procedures (Signed)
Ultrasound-guided  therapeutic paracentesis performed yielding 8.3 liters of yellow fluid. No immediate complications. EBL < 1 cc. The pt will receive IV albumin postprocedure.

## 2020-11-28 ENCOUNTER — Ambulatory Visit: Payer: Medicare HMO | Admitting: Family Medicine

## 2020-11-28 DIAGNOSIS — Z0289 Encounter for other administrative examinations: Secondary | ICD-10-CM

## 2020-11-28 NOTE — Progress Notes (Deleted)
OFFICE VISIT  11/28/2020  CC: No chief complaint on file.   HPI:    Patient is a 65 y.o. Caucasian female who presents for f/u chronic pain syndrome, cirrhosis with portal HTN/ascites, gait instability, and L ankle pain/fx/ulcer. A/P as of last visit 09/17/20: "1) Chronic pain syndrome: osteoarthritis multiple joints. Relatively recent L ankle fracture and ongoing acute pain related to this as well. Chronic upper abd discomfort. Discussed goals of opioid pain med treatment today---abd pain IS NOT what we are treating, and she understands this. Controlled substance contract reviewed with patient today.  Patient signed this and it will be placed in the chart.   UDS today. Plan at this time is to continue oxycodone 78m, 1 tid prn, #90 per month. No new oxycodone rx given today.  2) Alc+nonalc cirrhosis with portal HTN, chronic ascites, pancytopenia, hx of esoph varices, hx of recurrent hepatic encephalopathy. Cont iron, lasix and aldactone, now plan per GI to get q2 wk scheduled paracentesis (next one in 2d), cont f/u with hepatologist regarding possible transplant candidacy.   Has plan to see hematology for her pancytopenia. Labs drawn today by Dr. AHavery Moros cbc, cmet, afp, folate, vit b12 level.  3) Hypothyroidism: slight T4 dose increase due to mild TSH elevated 08/13/20. Plan for rpt TSH when I see her back for f/u in 121mo 4) Gait instability: ?neuropathy?.  Has neurologist initial evaluation set for next month. HH PT arrangements in the works. Currently wheelchair-bound for the most part. Strongly encouraged use of walker any time she does try to ambulate.  5) Left ankle pain: Recent small, nondisplaced cuneiform fracture noted on x-ray 08/13/20. F/u x-ray 11/5 showed no fracture. Wet to dry dressing L ankle--, possible wound clinic referral in near future if delayed healing. Has appt with Dr. WhDurward Fortesn orthopedics for early next week.  6) L ankle lateral malleolus  stage I ulceration---?secondary to friction from the cast she wore briefly on ankle.  This is stable and w/out sign of infection. Cont current wound care: wet to dry, change once daily.  7) Chronic nausea + abd pain: seems at baseline. Zofran. Hx of gallstones but no sign of acute cholecystitis. Labs today by Dr. ArHavery Moros  INTERIM HX: ***   PMP AWARE reviewed today: most recent rx for *** was filled ***, # ***, rx by ***. No red flags.   Past Medical History:  Diagnosis Date  . Anxiety and depression   . Cholelithiasis without cholecystitis   . Chronic abdominal pain   . Chronic nausea    + abd pain  . Cirrhosis of liver with ascites (HCC)    Alc cirrhosis; hx of multiple abd paracentesis, +esoph varices (+banding), pancytopenia, hx of hepatic enceph, hx of hydrothorax  . [place]   Dr. ArNoreene Larssondifluc contraind b/c prolonged QT)  . Heart murmur    [facility] Normal LV/RV size, basal septal hypertophy-normal variant, LVEF 65-70%, normal LV/RV function, est RAP 5 mmHg, no sign valvular stenosis or regurg--trace MR/TR/PR  . History of abnormal cervical Pap smear   . History of pneumonia   . Hypothyroidism   . Insomnia   . Nephrolithiasis    right renal pelvis, 13-1476monobst stone  . Neuropathy    unclear hx  . Osteoarthritis, multiple sites    primarily hands and knees  . Osteoporosis 02/09/2018  . Ovarian cyst    bilat.  Stable on MRI pelvis 11/2019 and CT abd/pelv 09/2020--no f/u imaging indicated.  .Marland Kitchen  Pancytopenia ([place])   . Splenomegaly   . Thrombocytopenia ([place]) 12/01/2019   severe, hx of plts down into 30s    Past Surgical History:  Procedure Laterality Date  . AUGMENTATION MAMMAPLASTY Bilateral    20 years ago  . BIOPSY  03/15/2018   gastric.  Procedure: BIOPSY;  Surgeon: Yetta Flock, MD;  [facility];  Service: Gastroenterology;;  Gastric  . BIOPSY  11/14/2020   Procedure: BIOPSY;   Surgeon: Yetta Flock, MD;  [facility];  Service: Gastroenterology;;  . COLONOSCOPY  2019  . COLONOSCOPY WITH ESOPHAGOGASTRODUODENOSCOPY (EGD) AND ESOPHAGEAL DILATION (ED)    . COLONOSCOPY WITH PROPOFOL N/A 03/15/2018   Redundant colon, severe looping, very edematous colon c/w portal HTN.  Procedure: COLONOSCOPY WITH PROPOFOL;  Surgeon: Yetta Flock, MD;  [facility];  Service: Gastroenterology;  Laterality: N/A;  . ESOPHAGOGASTRODUODENOSCOPY  11/14/2020   esoph candidiasis, nonbleeding ulcer (path benign), portal hypertensive gastropathy.  No varices.  . ESOPHAGOGASTRODUODENOSCOPY (EGD) WITH PROPOFOL N/A 03/15/2018   Gastritis, small gastric ulcer. Procedure: ESOPHAGOGASTRODUODENOSCOPY (EGD) WITH PROPOFOL;  Surgeon: Yetta Flock, MD;  [facility];  Service: Gastroenterology;  Laterality: N/A;  . ESOPHAGOGASTRODUODENOSCOPY (EGD) WITH PROPOFOL N/A 11/14/2020   Procedure: ESOPHAGOGASTRODUODENOSCOPY (EGD) WITH PROPOFOL;  Surgeon: Yetta Flock, MD;  [facility];  Service: Gastroenterology;  Laterality: N/A;  . ESOPHAGOGASTRODUODENOSCOPY W/ BANDING     Varices  . FRACTURE SURGERY Left    x 2 L elbow and hip left   . HARDWARE REMOVAL Left 09/17/2017   Procedure: HARDWARE REMOVAL LEFT OLECRANON;  Surgeon: Altamese [place], MD;  Location: [place];  Service: Orthopedics;  Laterality: Left;  . IR PARACENTESIS  11/18/2017  . IR PARACENTESIS  11/26/2017  . IR PARACENTESIS  03/14/2018  . IR PARACENTESIS  08/08/2020  . IR PARACENTESIS  09/23/2020  . IR PARACENTESIS  10/22/2020  . IR PARACENTESIS  10/30/2020  . IR PARACENTESIS  11/12/2020  . IR PARACENTESIS  11/20/2020  . IR PARACENTESIS  11/27/2020  . KNEE SURGERY Right    open incision for ligaments  . ORIF ELBOW FRACTURE Left 04/30/2017   Procedure: REVISION OPEN REDUCTION INTERNAL FIXATION (ORIF) ELBOW/OLECRANON FRACTURE;  Surgeon: Altamese , MD;  Location: [place];  Service:  Orthopedics;  Laterality: Left;  . ORIF HIP FRACTURE Left   . REPAIR EXTENSOR TENDON Left 12/29/2019   Procedure: LEFT LONG AND LEFT RING FRINGER EXTENSOR REALIGNAMENT WITH POSSIBLE TENDON TRANSFER;  Surgeon: Charlotte Crumb, MD;  Location: [place];  Service: Orthopedics;  Laterality: Left;  . virtual colonoscopy  04/24/2019   no polyps, no mass, no apple core lesion, no stricture    Outpatient Medications Prior to Visit  Medication Sig Dispense Refill  . diclofenac Sodium (VOLTAREN) 1 % GEL Apply 2 g topically daily as needed (for pain).    . feeding supplement (ENSURE ENLIVE / ENSURE PLUS) LIQD Take 237 mLs by mouth 2 (two) times daily between meals. 237 mL 12  . ferrous sulfate 325 (65 FE) MG EC tablet Take 1 tablet (325 mg total) by mouth in the morning and at bedtime. 180 tablet 1  . furosemide (LASIX) 40 MG tablet Take 1 tablet (40 mg total) by mouth 2 (two) times daily. 60 tablet 1  . lactulose (CHRONULAC) 10 GM/15ML solution Take 30 mLs (20 g total) by mouth 3 (three) times daily. 1892 mL 3  . levothyroxine (SYNTHROID, LEVOTHROID) 75 MCG tablet Take 1 tablet (75 mcg total) by mouth daily before  breakfast. 30 tablet 0  . Multiple Vitamin (MULTIVITAMIN WITH MINERALS) TABS tablet Take 1 tablet by mouth daily.    Marland Kitchen nystatin (MYCOSTATIN) 100000 UNIT/ML suspension Take 5 mLs (500,000 Units total) by mouth 4 (four) times daily for 14 days. 280 mL 0  . omeprazole (PRILOSEC) 40 MG capsule Take 1 capsule (40 mg total) by mouth 2 (two) times daily for 240 doses. 60 capsule 3  . ondansetron (ZOFRAN ODT) 4 MG disintegrating tablet Take 1 tablet (4 mg total) by mouth every 8 (eight) hours as needed for nausea or vomiting. 40 tablet 3  . Oxycodone HCl 10 MG TABS 1 tab po tid prn pain 90 tablet 0  . potassium chloride SA (KLOR-CON) 20 MEQ tablet Take 2 tablets (40 mEq total) by mouth daily. 30 tablet 2  . rifaximin (XIFAXAN) 550 MG TABS tablet Take 1 tablet (550 mg total) by mouth 2 (two) times daily.  Pt states she take once daily 60 tablet 1  . sertraline (ZOLOFT) 100 MG tablet Take 100 mg by mouth daily.    . sertraline (ZOLOFT) 50 MG tablet Take 1 tablet (50 mg total) by mouth daily. 4 tablet 0  . spironolactone (ALDACTONE) 100 MG tablet Take 2 tablets (200 mg total) by mouth daily. 30 tablet 2  . traZODone (DESYREL) 50 MG tablet Take 1 tablet (50 mg total) by mouth at bedtime as needed for sleep. 30 tablet 6   No facility-administered medications prior to visit.    Allergies  Allergen Reactions  . Penicillins Swelling and Other (See Comments)    FACIAL SWELLING Did it involve swelling of the face/tongue/throat, SOB, or low BP? Yes Did it involve sudden or severe rash/hives, skin peeling, or any reaction on the inside of your mouth or nose? No Did you need to seek medical attention at a hospital or doctor's office? Unknown When did it last happen?30 years If all above answers are "NO", may proceed with cephalosporin use.   . Sulfa Antibiotics Itching and Rash  . Tramadol Swelling and Rash  . Tylenol [Acetaminophen] Nausea Only    ROS As per HPI  PE: Vitals with BMI 11/27/2020 11/20/2020 11/20/2020  Height - - -  Weight - - -  BMI - - -  Systolic [phone]  Diastolic 84 70 67  Pulse - - -     ***  LABS:  Lab Results  Component Value Date   TSH 6.506 (H) 08/13/2020   Lab Results  Component Value Date   WBC 4.8 11/02/2020   HGB 8.4 (L) 11/02/2020   HCT 29.2 (L) 11/02/2020   MCV 92.1 11/02/2020   PLT 44 (L) 11/02/2020   Lab Results  Component Value Date   CREATININE 0.50 11/27/2020   BUN 8 11/27/2020   NA 135 11/27/2020   K 3.8 11/27/2020   CL 103 11/27/2020   CO2 23 11/27/2020   Lab Results  Component Value Date   ALT 17 11/02/2020   AST 44 (H) 11/02/2020   ALKPHOS 133 (H) 11/02/2020   BILITOT 2.1 (H) 11/02/2020   IMPRESSION AND PLAN:  No problem-specific Assessment & Plan notes found for this encounter.   An After Visit Summary was  printed and given to the patient.  FOLLOW UP: No follow-ups on file.  Signed:  Crissie Sickles, MD           11/28/2020

## 2020-11-29 ENCOUNTER — Other Ambulatory Visit: Payer: Self-pay

## 2020-11-29 DIAGNOSIS — R188 Other ascites: Secondary | ICD-10-CM

## 2020-11-29 DIAGNOSIS — K7460 Unspecified cirrhosis of liver: Secondary | ICD-10-CM

## 2020-12-02 ENCOUNTER — Telehealth: Payer: Self-pay | Admitting: Gastroenterology

## 2020-12-02 ENCOUNTER — Other Ambulatory Visit: Payer: Self-pay | Admitting: Nurse Practitioner

## 2020-12-02 NOTE — Telephone Encounter (Signed)
Inbound call from patient wanting to know if paracentesis appt can be moved up sooner.  Please advise.

## 2020-12-02 NOTE — Telephone Encounter (Signed)
Spoke with patient, advised that she can call the central scheduling number to reschedule paracentesis at her convenience. Patient verbalized understanding and had no concerns at the end of the call

## 2020-12-03 ENCOUNTER — Encounter: Payer: Self-pay | Admitting: Family Medicine

## 2020-12-03 ENCOUNTER — Telehealth: Payer: Self-pay | Admitting: Gastroenterology

## 2020-12-03 NOTE — Telephone Encounter (Signed)
Spoke with patient in regards to Dr. Doyne Keel recommendations per 11/27/20 result note. Patient verbalized understanding and did not express any concerns at the end of the call.

## 2020-12-03 NOTE — Telephone Encounter (Signed)
Pt called stating that she was returning your call. Pls cal her again.

## 2020-12-04 ENCOUNTER — Encounter: Payer: Self-pay | Admitting: Orthopaedic Surgery

## 2020-12-04 ENCOUNTER — Other Ambulatory Visit: Payer: Self-pay

## 2020-12-04 ENCOUNTER — Ambulatory Visit (INDEPENDENT_AMBULATORY_CARE_PROVIDER_SITE_OTHER): Payer: Medicare HMO | Admitting: Orthopaedic Surgery

## 2020-12-04 ENCOUNTER — Ambulatory Visit (INDEPENDENT_AMBULATORY_CARE_PROVIDER_SITE_OTHER): Payer: Medicare HMO

## 2020-12-04 VITALS — Ht 67.0 in | Wt 120.0 lb

## 2020-12-04 DIAGNOSIS — M25552 Pain in left hip: Secondary | ICD-10-CM | POA: Diagnosis not present

## 2020-12-04 DIAGNOSIS — M25572 Pain in left ankle and joints of left foot: Secondary | ICD-10-CM | POA: Insufficient documentation

## 2020-12-04 NOTE — Progress Notes (Signed)
Office Visit Note   Patient: Megan Roberson           Date of Birth: 1956/02/15           MRN: [id number] Visit Date: 12/04/2020              Requested by: Tammi Sou, MD [address] [place],  [place] [address] PCP: Tammi Sou, MD   Assessment & Plan: Visit Diagnoses:  1. Pain in left hip   2. Pain in left ankle and joints of left foot     Plan: Megan Roberson is accompanied by her husband.  On 28 January she slipped on a throw rug.  She did not fall but she extended her left lower extremity.  She has been having pain in the area of her right hip and groin as well as her left ankle.  Exam was relatively benign other than she has had some ecchymosis about her hip but x-rays were negative.  She has had prior superior and inferior pubic rami fractures on the left and a prior hip fracture with internal fixation.  That looks perfectly okay.  Ankle films were negative.  She is in a pain clinic and takes oxycodone on a chronic basis for her back.  Should continue with that for the present injury and just weight-bear as tolerated.  I like to see her back in 2 or 3 weeks if no improvement and repeat films  Follow-Up Instructions: Return if symptoms worsen or fail to improve.   Orders:  Orders Placed This Encounter  Procedures  . XR HIP UNILAT W OR W/O PELVIS 2-3 VIEWS LEFT  . XR Ankle 2 Views Left   No orders of the defined types were placed in this encounter.     Procedures: No procedures performed   Clinical Data: No additional findings.   Subjective: Chief Complaint  Patient presents with  . Left Hip - Pain  . Left Ankle - Pain  Patient presents today for left hip and left ankle pain. She states that she tripped over a throw rug and fell into her furniture on 11/29/2020. She didn't think much of it at first, but then developed pain all throughout her left hip. She has bruising and constant pain. She also has complaints of left lateral ankle pain since the  injury. She takes oxycodone for pain as prescribed by her PCP for pain management.  HPI  Review of Systems   Objective: Vital Signs: Ht 5' 7"  (1.702 m)   Wt 120 lb (54.4 kg)   BMI 18.79 kg/m   Physical Exam Constitutional:      Appearance: She is well-developed and well-nourished.  HENT:     Mouth/Throat:     Mouth: Oropharynx is clear and moist.  Eyes:     Extraocular Movements: EOM normal.     Pupils: Pupils are equal, round, and reactive to light.  Pulmonary:     Effort: Pulmonary effort is normal.  Skin:    General: Skin is warm and dry.  Neurological:     Mental Status: She is alert and oriented to person, place, and time.  Psychiatric:        Mood and Affect: Mood and affect normal.        Behavior: Behavior normal.     Ortho Exam awake and alert no acute distress evaluated in a wheelchair.  Having some pain along the lateral aspect of her left ankle but there is no skin  change or ecchymosis or edema.  Note obvious deformity.  Good capillary refill to toes.  No calf pain.  Does have some discomfort with flexion extension of her hip but multiple areas of tenderness.  She did have some areas of resolving ecchymosis about her hip but in multiple areas of tenderness that were not localized.  Did not have much pain with internal and external rotation of her left hip. Specialty Comments:  No specialty comments available.  Imaging: XR HIP UNILAT W OR W/O PELVIS 2-3 VIEWS LEFT  Result Date: 12/04/2020 AP pelvis lateral left hip obtained demonstrating a prior hip nailing in good position.  No evidence of a hip fracture.  There are some fracture line visible about the greater trochanter but they look old.  There is also a prior superior and inferior pubic rami fracture on the left.  Compared to films performed previously there did not appear to be any change.  There was some bulky changes about the iliac crest on the left but are similar to the prior films.  I do not see any  acute changes on today's films  XR Ankle 2 Views Left  Result Date: 12/04/2020 Films of the right ankle obtained in 2 projections no acute changes.  Diffuse osteopenia.  Prior os calcis fracture appears to have healed with some collapse but there is no pain.  No soft tissue swelling    PMFS History: Patient Active Problem List   Diagnosis Date Noted  . Pain in left ankle and joints of left foot 12/04/2020  . Gastric ulcer   . Closed traumatic minimally displaced fracture of left calcaneus 09/23/2020  . Hydrothorax   . Ascites 09/03/2020  . Closed left ankle fracture 09/03/2020  . Mood disorder ([place]) 08/17/2020  . Right lower lobe pneumonia 08/16/2020  . Asymptomatic bacteriuria   . Hypothyroidism 08/14/2020  . Displaced fracture of medial cuneiform of left foot, initial encounter for closed fracture 08/14/2020  . Failure to thrive in adult 08/14/2020  . Weight loss 08/14/2020  . UTI (urinary tract infection) 08/14/2020  . Fall at home, initial encounter 08/14/2020  . Generalized weakness 08/13/2020  . Pain in right knee 06/04/2020  . Pain in left hip 06/04/2020  . Nausea 05/19/2020  . Prolonged QT interval 05/19/2020  . Elevated brain natriuretic peptide (BNP) level 05/19/2020  . Hypoalbuminemia 05/19/2020  . Cough 05/19/2020  . Atelectasis 05/19/2020  . Hypomagnesemia 05/19/2020  . Cysts of both ovaries 06/15/2019  . Lumbar pain 02/27/2019  . Closed fracture of left tibial plateau 05/17/18 07/08/2018  . Esophageal varices without bleeding ([place])   . Osteoporosis 02/09/2018  . Goals of care, counseling/discussion 01/18/2018  . Refractory ascites 11/25/2017  . Cellulitis, abdominal wall 11/25/2017  . SOB (shortness of breath) 11/25/2017  . Protein-calorie malnutrition, severe ([place]) 11/06/2017  . Ogilvie's syndrome   . Abdominal distension   . Abdominal pain   . Liver failure without hepatic coma ([place])   . Thrombocytopenia ([place])   . Palliative care by specialist   .  Closed fracture of lumbar spine without lesion of spinal cord ([place])   . Recurrent falls 06/04/2017  . 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 Cirrhosis of liver with ascites ([place]) 04/21/2017  . Pancytopenia ([place]) 04/21/2017  . History of open reduction and internal fixation (ORIF) procedure 04/21/2017  . Visit for suture removal 04/21/2017  . Left Olecranon fracture 04/21/2017  . Hypokalemia 04/21/2017   Past Medical History:  Diagnosis Date  . Anxiety and  depression   . Cholelithiasis without cholecystitis   . Chronic abdominal pain   . Chronic nausea    + abd pain  . Cirrhosis of liver with ascites (HCC)    Alc cirrhosis; hx of multiple abd paracentesis, +esoph varices (+banding), pancytopenia, hx of hepatic enceph, hx of hydrothorax  . Esophageal candidiasis ([place]) 11/2020   Dr. Noreene Larsson (difluc contraind b/c prolonged QT)  . Heart murmur    Echo 10/06/17 [facility]): Normal LV/RV size, basal septal hypertophy-normal variant, LVEF 65-70%, normal LV/RV function, est RAP 5 mmHg, no sign valvular stenosis or regurg--trace MR/TR/PR  . History of abnormal cervical Pap smear   . History of pneumonia   . Hypothyroidism   . Insomnia   . Nephrolithiasis    right renal pelvis, 13-42m nonobst stone  . Neuropathy    unclear hx  . Osteoarthritis, multiple sites    primarily hands and knees  . Osteoporosis 02/09/2018  . Ovarian cyst    bilat.  Stable on MRI pelvis 11/2019 and CT abd/pelv 09/2020--no f/u imaging indicated.  . Pancytopenia ([place]   . Splenomegaly   . [place]rombocytopenia ([place] 12/01/2019   severe, hx of plts down into 337s   Family History  Adopted: Yes  Problem Relation Age of Onset  . Alcohol abuse Daughter     Past Surgical History:  Procedure Laterality Date  . AUGMENTATION MAMMAPLASTY Bilateral    20 years ago  . BIOPSY  03/15/2018   gastric.  Procedure: BIOPSY;  Surgeon: AYetta Flock MD;  [facility];  Service: Gastroenterology;;   Gastric  . BIOPSY  11/14/2020   Procedure: BIOPSY;  Surgeon: AYetta Flock MD;  [facility];  Service: Gastroenterology;;  . COLONOSCOPY  2019  . COLONOSCOPY WITH ESOPHAGOGASTRODUODENOSCOPY (EGD) AND ESOPHAGEAL DILATION (ED)    . COLONOSCOPY WITH PROPOFOL N/A 03/15/2018   Redundant colon, severe looping, very edematous colon c/w portal HTN.  Procedure: COLONOSCOPY WITH PROPOFOL;  Surgeon: AYetta Flock MD;  [facility];  Service: Gastroenterology;  Laterality: N/A;  . ESOPHAGOGASTRODUODENOSCOPY  11/14/2020   esoph candidiasis, nonbleeding ulcer (path benign), portal hypertensive gastropathy.  No varices.  . ESOPHAGOGASTRODUODENOSCOPY (EGD) WITH PROPOFOL N/A 03/15/2018   Gastritis, small gastric ulcer. Procedure: ESOPHAGOGASTRODUODENOSCOPY (EGD) WITH PROPOFOL;  Surgeon: AYetta Flock MD;  [facility];  Service: Gastroenterology;  Laterality: N/A;  . ESOPHAGOGASTRODUODENOSCOPY (EGD) WITH PROPOFOL N/A 11/14/2020   Procedure: ESOPHAGOGASTRODUODENOSCOPY (EGD) WITH PROPOFOL;  Surgeon: AYetta Flock MD;  [facility];  Service: Gastroenterology;  Laterality: N/A;  . ESOPHAGOGASTRODUODENOSCOPY W/ BANDING     Varices  . FRACTURE SURGERY Left    x 2 L elbow and hip left   . HARDWARE REMOVAL Left 09/17/2017   Procedure: HARDWARE REMOVAL LEFT OLECRANON;  Surgeon: HAltamese [place] MD;  [place] [facility]  Service: Orthopedics;  Laterality: Left;  . IR PARACENTESIS  11/18/2017  . IR PARACENTESIS  11/26/2017  . IR PARACENTESIS  03/14/2018  . IR PARACENTESIS  08/08/2020  . IR PARACENTESIS  09/23/2020  . IR PARACENTESIS  10/22/2020  . IR PARACENTESIS  10/30/2020  . IR PARACENTESIS  11/12/2020  . IR PARACENTESIS  11/20/2020  . IR PARACENTESIS  11/27/2020  . KNEE SURGERY Right    open incision for ligaments  . ORIF ELBOW FRACTURE Left 04/30/2017   Procedure: REVISION OPEN REDUCTION INTERNAL FIXATION (ORIF) ELBOW/OLECRANON FRACTURE;  Surgeon:  HAltamese [place] MD;  [place]  Service: Orthopedics;  Laterality: Left;  . ORIF HIP  FRACTURE Left   . REPAIR EXTENSOR TENDON Left 12/29/2019   Procedure: LEFT LONG AND LEFT RING FRINGER EXTENSOR REALIGNAMENT WITH POSSIBLE TENDON TRANSFER;  Surgeon: Charlotte Crumb, MD;  Location: [place];  Service: Orthopedics;  Laterality: Left;  . virtual colonoscopy  04/24/2019   no polyps, no mass, no apple core lesion, no stricture   Social History   Occupational History  . Occupation: disabled  Tobacco Use  . Smoking status: Never Smoker  . Smokeless tobacco: Never Used  Vaping Use  . Vaping Use: Never used  Substance and Sexual Activity  . Alcohol use: No  . Drug use: No  . Sexual activity: Not Currently    Birth control/protection: Post-menopausal

## 2020-12-05 ENCOUNTER — Other Ambulatory Visit: Payer: Self-pay

## 2020-12-05 ENCOUNTER — Ambulatory Visit (HOSPITAL_COMMUNITY)
Admission: RE | Admit: 2020-12-05 | Discharge: 2020-12-05 | Disposition: A | Discharge status: Home / Self Care | Payer: Medicare HMO | Source: Ambulatory Visit | Attending: Gastroenterology | Admitting: Gastroenterology

## 2020-12-05 ENCOUNTER — Encounter: Payer: Self-pay | Admitting: Student

## 2020-12-05 DIAGNOSIS — R188 Other ascites: Secondary | ICD-10-CM | POA: Insufficient documentation

## 2020-12-05 DIAGNOSIS — K7460 Unspecified cirrhosis of liver: Secondary | ICD-10-CM

## 2020-12-05 HISTORY — PX: IR PARACENTESIS: IMG2679

## 2020-12-05 MED ORDER — LIDOCAINE HCL 1 % IJ SOLN
INTRAMUSCULAR | Status: DC | PRN
  Administered 2020-12-05: 10 mL

## 2020-12-05 MED ORDER — ALBUMIN HUMAN 25 % IV SOLN
50.0000 g | Freq: Once | INTRAVENOUS | Status: AC
  Administered 2020-12-05: 50 g via INTRAVENOUS
  Filled 2020-12-05: qty 200

## 2020-12-05 MED ORDER — LIDOCAINE HCL 1 % IJ SOLN
INTRAMUSCULAR | Status: AC
  Filled 2020-12-05: qty 20

## 2020-12-05 MED ORDER — ALBUMIN HUMAN 25 % IV SOLN
INTRAVENOUS | Status: AC
  Filled 2020-12-05: qty 200

## 2020-12-05 NOTE — Procedures (Signed)
PROCEDURE SUMMARY:  Successful image-guided paracentesis from the left lateral abdomen.  Yielded 8 liters of clear yellow fluid.  No immediate complications.  EBL = trace Patient tolerated well.   Please see imaging section of Epic for full dictation.   [place] PA-C 12/05/2020 2:13 PM

## 2020-12-06 ENCOUNTER — Other Ambulatory Visit: Payer: Self-pay | Admitting: Nurse Practitioner

## 2020-12-09 ENCOUNTER — Ambulatory Visit (INDEPENDENT_AMBULATORY_CARE_PROVIDER_SITE_OTHER): Payer: Medicare HMO | Admitting: Family Medicine

## 2020-12-09 ENCOUNTER — Telehealth: Payer: Self-pay

## 2020-12-09 ENCOUNTER — Encounter: Payer: Self-pay | Admitting: Family Medicine

## 2020-12-09 ENCOUNTER — Other Ambulatory Visit: Payer: Self-pay

## 2020-12-09 VITALS — BP 110/72 | HR 85 | Resp 14 | Wt 115.6 lb

## 2020-12-09 DIAGNOSIS — D696 Thrombocytopenia, unspecified: Secondary | ICD-10-CM | POA: Diagnosis not present

## 2020-12-09 DIAGNOSIS — R188 Other ascites: Secondary | ICD-10-CM

## 2020-12-09 DIAGNOSIS — K7460 Unspecified cirrhosis of liver: Secondary | ICD-10-CM

## 2020-12-09 DIAGNOSIS — D649 Anemia, unspecified: Secondary | ICD-10-CM

## 2020-12-09 DIAGNOSIS — R319 Hematuria, unspecified: Secondary | ICD-10-CM

## 2020-12-09 DIAGNOSIS — E039 Hypothyroidism, unspecified: Secondary | ICD-10-CM

## 2020-12-09 DIAGNOSIS — Z79899 Other long term (current) drug therapy: Secondary | ICD-10-CM

## 2020-12-09 DIAGNOSIS — N3001 Acute cystitis with hematuria: Secondary | ICD-10-CM

## 2020-12-09 DIAGNOSIS — Z23 Encounter for immunization: Secondary | ICD-10-CM | POA: Diagnosis not present

## 2020-12-09 LAB — POCT URINALYSIS DIPSTICK
Glucose, UA: NEGATIVE
Ketones, UA: POSITIVE
Nitrite, UA: POSITIVE
Protein, UA: POSITIVE — AB
Spec Grav, UA: 1.025 (ref 1.010–1.025)
Urobilinogen, UA: 2 E.U./dL — AB
pH, UA: 6 (ref 5.0–8.0)

## 2020-12-09 MED ORDER — OXYCODONE HCL 10 MG PO TABS: ORAL_TABLET | ORAL | 0 refills | Status: DC

## 2020-12-09 MED ORDER — BELSOMRA 10 MG PO TABS: ORAL_TABLET | ORAL | 5 refills | Status: DC

## 2020-12-09 MED ORDER — NITROFURANTOIN MONOHYD MACRO 100 MG PO CAPS: 100.0000 mg | ORAL_CAPSULE | Freq: Two times a day (BID) | ORAL | 0 refills | Status: DC

## 2020-12-09 NOTE — Progress Notes (Signed)
OFFICE VISIT  12/09/2020  CC:  Chief Complaint  Patient presents with  . Follow-up    Chronic pain, wound, protein-calorie malnutrition. Flu and Shingles injections Pt hurt her left hip- slipped on a rug. Has a bruise. 3 weeks ago. She did see ortho, no breaks.  Needs refill on Trazodone. Pt has been taking Quinapril and it helps. Would like an early refill on Oxycodone due to hip pain.   HPI:    Patient is a 65 y.o. Caucasian female who presents accompanied by her husband for 3 mo f/u chronic pain, cirrhosis with ascites, hypothyroidism, and chronic debilitation. I last saw her about 3 mo ago. A/P as of last visit: "1) Chronic pain syndrome: osteoarthritis multiple joints. Relatively recent L ankle fracture and ongoing acute pain related to this as well. Chronic upper abd discomfort. Discussed goals of opioid pain med treatment today---abd pain IS NOT what we are treating, and she understands this. Controlled substance contract reviewed with patient today.  Patient signed this and it will be placed in the chart.   UDS today. Plan at this time is to continue oxycodone 75m, 1 tid prn, #90 per month. No new oxycodone rx given today.  2) Alc+nonalc cirrhosis with portal HTN, chronic ascites, pancytopenia, hx of esoph varices, hx of recurrent hepatic encephalopathy. Cont iron, lasix and aldactone, now plan per GI to get q2 wk scheduled paracentesis (next one in 2d), cont f/u with hepatologist regarding possible transplant candidacy.   Has plan to see hematology for her pancytopenia. Labs drawn today by Dr. AHavery Moros cbc, cmet, afp, folate, vit b12 level.  3) Hypothyroidism: slight T4 dose increase due to mild TSH elevated 08/13/20. Plan for rpt TSH when I see her back for f/u in 171mo 4) Gait instability: ?neuropathy?.  Has neurologist initial evaluation set for next month. HH PT arrangements in the works. Currently wheelchair-bound for the most part. Strongly encouraged use of  walker any time she does try to ambulate.  5) Left ankle pain: Recent small, nondisplaced cuneiform fracture noted on x-ray 08/13/20. F/u x-ray 11/5 showed no fracture. Wet to dry dressing L ankle--, possible wound clinic referral in near future if delayed healing. Has appt with Dr. WhDurward Fortesn orthopedics for early next week.  6) L ankle lateral malleolus stage I ulceration---?secondary to friction from the cast she wore briefly on ankle.  This is stable and w/out sign of infection. Cont current wound care: wet to dry, change once daily.  7) Chronic nausea + abd pain: seems at baseline. Zofran. Hx of gallstones but no sign of acute cholecystitis. Labs today by Dr. ArHavery Moros  INTERIM HX:  Liver/abd: says she feels pretty good regarding abd distention--last paracentesis was 1 wks ago (she is on a once weekly schedule).  Feels ascites gradually building up as per her usual. No longer taking ANY lasix --per GI MD recommendations.  Taking 1 and 1/2 aldactone 100 mg qd tabs now.  Taking 1 Klorcon 20 meq two-to-three times per week.  Takes xifaxan bid but only gets a few chronulac doses in per week.  Her chronic nausea is MUCH improved since treating with inc PPI for PUD and nystatin for esoph candidiasis.    Hypoth: takes it after eating in morning with all her other meds.  Pain and debilitation:  Hands arthritis pain fairly well controlled on oxycodone 102mid.  Has been taking 4-5 oxycodone most days for at least a week now d/u recent L hip pain. She saw Dr.  Whitfield in orthopedics on 12/04/20 for L hip and L ankle pain s/p slip at home and fell sideways and hit L hip on a counter---did not fall onto hip.  Large bruise sustained.  Imaging showed no acute issue and ortho recommended obs for grad improvement regarding her acute pain in these areas.  No new meds were rx'd.    Any HH at this time?->no.  Says she would participate in home PT, though. Biggest issues: core strength, L  ankle/foot pain (chronic). Needs assistance with all ADLs.   She is able to walk only a few steps with a walker due to generalized weakness in arms, legs, core.  Onset about 1 mo ago seeing urine more "red" colored, and getting deeper red.  No dysuria, unusually urgency, or frequency. Drking mostly ginger ale: about a 3L per day.  Occ V8.  1 protein-powder + mild drink daily.   PMP AWARE reviewed today: most recent rx for oxycodone 68m was filled 11/20/20, # 964 rx by me. No red flags.  Past Medical History:  Diagnosis Date  . Anxiety and depression   . Cholelithiasis without cholecystitis   . Chronic abdominal pain   . Chronic nausea    + abd pain  . Cirrhosis of liver with ascites (HCC)    Alc cirrhosis; hx of multiple abd paracentesis, +esoph varices (+banding), pancytopenia, hx of hepatic enceph, hx of hydrothorax  . [place]   Dr. ANoreene Larsson(difluc contraind b/c prolonged QT)  . Heart murmur    Echo 10/06/17 ([facility]: Normal LV/RV size, basal septal hypertophy-normal variant, LVEF 65-70%, normal LV/RV function, est RAP 5 mmHg, no sign valvular stenosis or regurg--trace MR/TR/PR  . History of abnormal cervical Pap smear   . History of pneumonia   . Hypothyroidism   . Insomnia   . Nephrolithiasis    right renal pelvis, 13-144mnonobst stone  . Neuropathy    unclear hx  . Osteoarthritis, multiple sites    primarily hands and knees  . Osteoporosis 02/09/2018  . Ovarian cyst    bilat.  Stable on MRI pelvis 11/2019 and CT abd/pelv 09/2020--no f/u imaging indicated.  . Pancytopenia ([facility]  . Splenomegaly   . Thrombocytopenia ([facility]/[place]   severe, hx of plts down into 30s    Past Surgical History:  Procedure Laterality Date  . AUGMENTATION MAMMAPLASTY Bilateral    20 years ago  . BIOPSY  03/15/2018   gastric.  Procedure: BIOPSY;  Surgeon: ArYetta FlockMD;  [facility];  Service: Gastroenterology;;  Gastric  .  BIOPSY  11/14/2020   Procedure: BIOPSY;  Surgeon: ArYetta FlockMD;  [facility];  Service: Gastroenterology;;  . COLONOSCOPY  2019  . COLONOSCOPY WITH ESOPHAGOGASTRODUODENOSCOPY (EGD) AND ESOPHAGEAL DILATION (ED)    . COLONOSCOPY WITH PROPOFOL N/A 03/15/2018   Redundant colon, severe looping, very edematous colon c/w portal HTN.  Procedure: COLONOSCOPY WITH PROPOFOL;  Surgeon: ArYetta FlockMD;  [facility];  Service: Gastroenterology;  Laterality: N/A;  . ESOPHAGOGASTRODUODENOSCOPY  11/14/2020   esoph candidiasis, nonbleeding ulcer (path benign), portal hypertensive gastropathy.  No varices.  . ESOPHAGOGASTRODUODENOSCOPY (EGD) WITH PROPOFOL N/A 03/15/2018   Gastritis, small gastric ulcer. Procedure: ESOPHAGOGASTRODUODENOSCOPY (EGD) WITH PROPOFOL;  Surgeon: ArYetta FlockMD;  [facility];  Service: Gastroenterology;  Laterality: N/A;  . ESOPHAGOGASTRODUODENOSCOPY (EGD) WITH PROPOFOL N/A 11/14/2020   Procedure: ESOPHAGOGASTRODUODENOSCOPY (EGD) WITH PROPOFOL;  Surgeon: ArYetta FlockMD;  [facility];  Service:  Gastroenterology;  Laterality: N/A;  . ESOPHAGOGASTRODUODENOSCOPY W/ BANDING     Varices  . FRACTURE SURGERY Left    x 2 L elbow and hip left   . HARDWARE REMOVAL Left 09/17/2017   Procedure: HARDWARE REMOVAL LEFT OLECRANON;  Surgeon: Altamese Lyons, MD;  Location: [place];  Service: Orthopedics;  Laterality: Left;  . IR PARACENTESIS  11/18/2017  . IR PARACENTESIS  11/26/2017  . IR PARACENTESIS  03/14/2018  . IR PARACENTESIS  08/08/2020  . IR PARACENTESIS  09/23/2020  . IR PARACENTESIS  10/22/2020  . IR PARACENTESIS  10/30/2020  . IR PARACENTESIS  11/12/2020  . IR PARACENTESIS  11/20/2020  . IR PARACENTESIS  11/27/2020  . IR PARACENTESIS  12/05/2020  . KNEE SURGERY Right    open incision for ligaments  . ORIF ELBOW FRACTURE Left 04/30/2017   Procedure: REVISION OPEN REDUCTION INTERNAL FIXATION (ORIF) ELBOW/OLECRANON  FRACTURE;  Surgeon: Altamese Wren, MD;  Location: [place];  Service: Orthopedics;  Laterality: Left;  . ORIF HIP FRACTURE Left   . REPAIR EXTENSOR TENDON Left 12/29/2019   Procedure: LEFT LONG AND LEFT RING FRINGER EXTENSOR REALIGNAMENT WITH POSSIBLE TENDON TRANSFER;  Surgeon: Charlotte Crumb, MD;  Location: [place];  Service: Orthopedics;  Laterality: Left;  . virtual colonoscopy  04/24/2019   no polyps, no mass, no apple core lesion, no stricture    Outpatient Medications Prior to Visit  Medication Sig Dispense Refill  . diclofenac Sodium (VOLTAREN) 1 % GEL Apply 2 g topically daily as needed (for pain).    . feeding supplement (ENSURE ENLIVE / ENSURE PLUS) LIQD Take 237 mLs by mouth 2 (two) times daily between meals. 237 mL 12  . ferrous sulfate 325 (65 FE) MG EC tablet Take 1 tablet (325 mg total) by mouth in the morning and at bedtime. 180 tablet 1  . lactulose (CHRONULAC) 10 GM/15ML solution Take 30 mLs (20 g total) by mouth 3 (three) times daily. 1892 mL 3  . levothyroxine (SYNTHROID, LEVOTHROID) 75 MCG tablet Take 1 tablet (75 mcg total) by mouth daily before breakfast. 30 tablet 0  . Multiple Vitamin (MULTIVITAMIN WITH MINERALS) TABS tablet Take 1 tablet by mouth daily.    Marland Kitchen omeprazole (PRILOSEC) 40 MG capsule Take 1 capsule (40 mg total) by mouth 2 (two) times daily for 240 doses. 60 capsule 3  . ondansetron (ZOFRAN ODT) 4 MG disintegrating tablet Take 1 tablet (4 mg total) by mouth every 8 (eight) hours as needed for nausea or vomiting. 40 tablet 3  . potassium chloride SA (KLOR-CON) 20 MEQ tablet Take 2 tablets (40 mEq total) by mouth daily. 30 tablet 2  . sertraline (ZOLOFT) 100 MG tablet Take 100 mg by mouth daily.    . sertraline (ZOLOFT) 50 MG tablet Take 1 tablet (50 mg total) by mouth daily. 4 tablet 0  . spironolactone (ALDACTONE) 100 MG tablet Take 2 tablets (200 mg total) by mouth daily. 30 tablet 2  . traZODone (DESYREL) 50 MG tablet Take 1 tablet (50 mg total) by mouth at  bedtime as needed for sleep. 30 tablet 6  . furosemide (LASIX) 40 MG tablet Take 1 tablet (40 mg total) by mouth 2 (two) times daily. 60 tablet 1  . furosemide (LASIX) 80 MG tablet Take by mouth.    Marland Kitchen omeprazole (PRILOSEC) 20 MG capsule TAKE 1 CAPSULE BY MOUTH EVERY DAY 90 capsule 1  . Oxycodone HCl 10 MG TABS 1 tab po tid prn pain 90 tablet 0  . rifaximin (  XIFAXAN) 550 MG TABS tablet Take 1 tablet (550 mg total) by mouth 2 (two) times daily. Pt states she take once daily 60 tablet 1   No facility-administered medications prior to visit.    Allergies  Allergen Reactions  . Penicillins Swelling and Other (See Comments)    FACIAL SWELLING Did it involve swelling of the face/tongue/throat, SOB, or low BP? Yes Did it involve sudden or severe rash/hives, skin peeling, or any reaction on the inside of your mouth or nose? No Did you need to seek medical attention at a hospital or doctor's office? Unknown When did it last happen?30 years If all above answers are "NO", may proceed with cephalosporin use.   . Sulfa Antibiotics Itching and Rash  . Tramadol Swelling and Rash  . Tylenol [Acetaminophen] Nausea Only    ROS As per HPI  PE: Vitals with BMI 12/09/2020 12/05/2020 12/05/2020  Height - - -  Weight 115 lbs 10 oz - -  BMI 86.5 - -  Systolic 784 696 295  Diastolic 72 69 69  Pulse 85 - -     Gen: Alert, thin/frail- appearing, no acute distress.  Patient is oriented to person, place, time, and situation. CV: RRR, no m/r/g.   LUNGS: CTA bilat, nonlabored resps, good aeration in all lung fields. ABD: soft, moderate distention---examined sitting in WC. No tenderness.  BS normal. EXT: no clubbing or cyanosis.  no edema.  L hip lateral aspect with large ecchymoses .  LABS:    Chemistry      Component Value Date/Time   NA 135 11/27/2020 1219   K 3.8 11/27/2020 1219   CL 103 11/27/2020 1219   CO2 23 11/27/2020 1219   BUN 8 11/27/2020 1219   CREATININE 0.50 11/27/2020 1219       Component Value Date/Time   CALCIUM 8.8 11/27/2020 1219   ALKPHOS 133 (H) 11/02/2020 1639   AST 44 (H) 11/02/2020 1639   ALT 17 11/02/2020 1639   BILITOT 2.1 (H) 11/02/2020 1639     Lab Results  Component Value Date   WBC 4.8 11/02/2020   HGB 8.4 (L) 11/02/2020   HCT 29.2 (L) 11/02/2020   MCV 92.1 11/02/2020   PLT 44 (L) 11/02/2020   Lab Results  Component Value Date   IRON 28 08/13/2020   TIBC 340 08/13/2020   FERRITIN 36 08/13/2020   Lab Results  Component Value Date   VITAMINB12 990 (H) 09/17/2020   Lab Results  Component Value Date   TSH 6.506 (H) 08/13/2020    IMPRESSION AND PLAN:  1) chronic pain syndrome: hands osteoarth pain controlled fine, but lately acute pain of L hip contusion (and ongoing L ankle pain) causing inc need for oxycodone. Warned pt against too much inc in use of this med.  Her prev RF's were not requested early. OK to fill new #90 rx of oxycodone 78m today (about 12 d early), rx sent in. UDS today. CSC UTD.  2) Insomnia: quetiapine helpful in the past but contraindicated in this pt with QT prolongation. Trazodone no longer an option for same reason.   Avoiding benzo's since pt on opioids. Hydroxyzine->can't use w/QT prolongation. Will see if belsomra 179mqhs can get approved.  3) Cirrhosis with portal HTN, signif chronic ascites: pt getting weekly paracentesis per GI now, no longer on lasix b/c relatively ineffective for her of late. CMET today.  4) Chronic nausea: signif improved with recent treatment of esoph candidiasis and getting on scheduled dosing of  omeprazole 42m bid.  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 Cont iron supplement bid. Monitor Hb today.  6) Thrombocytopenia: secondary to #3. CBC today.  7) Gross hematuria: per pt report. UA today, sent for urine c/s. UA result noted after pt left office->abnormal. Penicillin and sulfa allergy.  Start  macrobid and follow up c/s.  8) Hypothyroidism: taking T4 daily but incorrectly. TSH today.   9) Deb[facility]ated patient: [facility]. Will re-order (HH PT/OT/nursing, ? DME--pt states she needs new mattress for her bed).  Spent 45 min with pt today reviewing HPI, reviewing relevant past history, doing exam, reviewing and discussing lab and imaging data, and formulating plans.   An After Visit Summary was printed and given to the patient.  FOLLOW UP: Return in about 4 weeks (around 01/06/2021) for routine chronic illness f/u.  Signed:  PCrissie Sickles MD           12/09/2020

## 2020-12-09 NOTE — Telephone Encounter (Signed)
Called pt and left a voicemail for her to call the office back. Dr Anitra Lauth asked me to call with the following message:   Megan Roberson's UA showed signs of infection so I am sending in antibiotic, pls call and let her know. Also, tell her I did eRx for a sleep medication called belsomra.-thx

## 2020-12-10 ENCOUNTER — Other Ambulatory Visit: Payer: Self-pay | Admitting: Family Medicine

## 2020-12-10 ENCOUNTER — Telehealth: Payer: Self-pay

## 2020-12-10 ENCOUNTER — Telehealth: Payer: Self-pay | Admitting: Family Medicine

## 2020-12-10 ENCOUNTER — Encounter: Payer: Self-pay | Admitting: Family Medicine

## 2020-12-10 ENCOUNTER — Other Ambulatory Visit: Payer: Self-pay

## 2020-12-10 DIAGNOSIS — R531 Weakness: Secondary | ICD-10-CM

## 2020-12-10 DIAGNOSIS — G894 Chronic pain syndrome: Secondary | ICD-10-CM

## 2020-12-10 DIAGNOSIS — R5381 Other malaise: Secondary | ICD-10-CM

## 2020-12-10 DIAGNOSIS — R188 Other ascites: Secondary | ICD-10-CM

## 2020-12-10 DIAGNOSIS — K7460 Unspecified cirrhosis of liver: Secondary | ICD-10-CM

## 2020-12-10 NOTE — Addendum Note (Signed)
Addended by: Octaviano Glow on: 12/10/2020 05:29 PM   Modules accepted: Orders

## 2020-12-10 NOTE — Telephone Encounter (Signed)
OK, I called and spoke with pharmacist at [facility] and we managed to get ins to authorize RF on 12/13/20. Pls let pt know that she can go pick the med up on 2/11 but tell her I want her to go back to taking only 1 tab up to 3 times in a 24h period at the most----#90 tabs has to last her a full month from here on out.-thx

## 2020-12-10 NOTE — Telephone Encounter (Signed)
Home health referral entered using dx codes provided.

## 2020-12-10 NOTE — Telephone Encounter (Signed)
Patient was seen yesterday by Dr. Anitra Lauth.  He prescribed patient  Oxycodone HCl 10 MG TABS [[id number]]   per patient pharmacy will not fill prescription, states too early can fill on 2/18 [facility]  Please advise [phone]

## 2020-12-10 NOTE — Telephone Encounter (Signed)
Spoke with patient and advised of medication being sent for UA infection and for sleep. We went over both names of the medications and directions.

## 2020-12-10 NOTE — Telephone Encounter (Signed)
Pls order home health referral for PT, OT, nursing, and DME assessment (pt states needs to bed mattress). Dx generalized weakness, debilitated patient, cirrhosis with ascites, and chronic pain syndrome.-thx

## 2020-12-10 NOTE — Telephone Encounter (Signed)
Spoke with pharmacist at [facility] and per the regulatory system, prescriber would have to call and provide verbal reason as to why patient needs override for refill earlier than 2 days allowed since the sig directions and qty are still the same. They do not accept note to pharmacy only stating "ok to fill on this date". Pt's last refill was 11/20/20 and the earliest she would be allowed to fill is 2/17.  Please advise, thanks.

## 2020-12-11 ENCOUNTER — Ambulatory Visit (HOSPITAL_COMMUNITY): Payer: Medicare HMO

## 2020-12-11 LAB — CBC WITH DIFFERENTIAL/PLATELET
Absolute Monocytes: 1027 cells/uL — ABNORMAL HIGH (ref 200–950)
Basophils Absolute: 27 cells/uL (ref 0–200)
Basophils Relative: 0.4 %
Eosinophils Absolute: 41 cells/uL (ref 15–500)
Eosinophils Relative: 0.6 %
HCT: 27.3 % — ABNORMAL LOW (ref 35.0–45.0)
Hemoglobin: 8.7 g/dL — ABNORMAL LOW (ref 11.7–15.5)
Lymphs Abs: 503 cells/uL — ABNORMAL LOW (ref 850–3900)
MCH: 27 pg (ref 27.0–33.0)
MCHC: 31.9 g/dL — ABNORMAL LOW (ref 32.0–36.0)
MCV: 84.8 fL (ref 80.0–100.0)
Monocytes Relative: 15.1 %
Neutro Abs: 5202 cells/uL (ref 1500–7800)
Neutrophils Relative %: 76.5 %
Platelets: 48 10*3/uL — ABNORMAL LOW (ref 140–400)
RBC: 3.22 10*6/uL — ABNORMAL LOW (ref 3.80–5.10)
RDW: 19.2 % — ABNORMAL HIGH (ref 11.0–15.0)
Total Lymphocyte: 7.4 %
WBC: 6.8 10*3/uL (ref 3.8–10.8)

## 2020-12-12 ENCOUNTER — Other Ambulatory Visit: Payer: Self-pay

## 2020-12-12 ENCOUNTER — Telehealth: Payer: Self-pay

## 2020-12-12 ENCOUNTER — Ambulatory Visit (HOSPITAL_COMMUNITY)
Admission: RE | Admit: 2020-12-12 | Discharge: 2020-12-12 | Disposition: A | Discharge status: Home / Self Care | Payer: Medicare HMO | Source: Ambulatory Visit | Attending: Gastroenterology | Admitting: Gastroenterology

## 2020-12-12 DIAGNOSIS — R188 Other ascites: Secondary | ICD-10-CM | POA: Diagnosis not present

## 2020-12-12 DIAGNOSIS — K7460 Unspecified cirrhosis of liver: Secondary | ICD-10-CM

## 2020-12-12 HISTORY — PX: IR PARACENTESIS: IMG2679

## 2020-12-12 MED ORDER — LIDOCAINE HCL (PF) 1 % IJ SOLN
INTRAMUSCULAR | Status: DC | PRN
  Administered 2020-12-12: 10 mL

## 2020-12-12 MED ORDER — ALBUMIN HUMAN 25 % IV SOLN
INTRAVENOUS | Status: AC
  Filled 2020-12-12: qty 200

## 2020-12-12 MED ORDER — ALBUMIN HUMAN 25 % IV SOLN
50.0000 g | Freq: Once | INTRAVENOUS | Status: AC
  Administered 2020-12-12: 50 g via INTRAVENOUS
  Filled 2020-12-12: qty 200

## 2020-12-12 MED ORDER — LIDOCAINE HCL 1 % IJ SOLN
INTRAMUSCULAR | Status: AC
  Filled 2020-12-12: qty 20

## 2020-12-12 NOTE — Telephone Encounter (Signed)
FYI. Please see below.  Spoke with Jocelyn Lamer w/ [facility] regarding urine drug screen. She advised that oxycodone may not have shown on drug monitoring because the one done on 2/7 only does positive or negative. She did provide add-on test option which would lower the cut off and be more sensitive. Agreed with adding on test, oxycodone confirmatory test which takes 3-4 additional days. This should be resulted by Monday 2/14. Test code provided was [id number].

## 2020-12-12 NOTE — Telephone Encounter (Signed)
Noted! Thank you

## 2020-12-12 NOTE — Procedures (Signed)
Ultrasound-guided therapeutic paracentesis performed yielding 5.2 liters of yellow fluid. No immediate complications.The pt will receive IV albumin postprocedure. EBL< 1 cc.

## 2020-12-13 ENCOUNTER — Other Ambulatory Visit: Payer: Self-pay | Admitting: Family Medicine

## 2020-12-13 ENCOUNTER — Other Ambulatory Visit: Payer: Self-pay

## 2020-12-13 DIAGNOSIS — G894 Chronic pain syndrome: Secondary | ICD-10-CM

## 2020-12-13 DIAGNOSIS — Z79899 Other long term (current) drug therapy: Secondary | ICD-10-CM

## 2020-12-13 DIAGNOSIS — E039 Hypothyroidism, unspecified: Secondary | ICD-10-CM

## 2020-12-13 DIAGNOSIS — R188 Other ascites: Secondary | ICD-10-CM

## 2020-12-13 DIAGNOSIS — K7460 Unspecified cirrhosis of liver: Secondary | ICD-10-CM

## 2020-12-16 ENCOUNTER — Telehealth: Payer: Self-pay

## 2020-12-16 ENCOUNTER — Telehealth: Payer: Self-pay | Admitting: Family Medicine

## 2020-12-16 NOTE — Telephone Encounter (Signed)
Spoke with patient to remind her that she is due for repeat labs at this time. She is aware that no appt is necessary and she can stop by the lab at her convenience between 7:30 AM - 5 PM. Patient verbalized understanding of all information and had no concerns at the end of the call.

## 2020-12-16 NOTE — Telephone Encounter (Signed)
Attempted to schedule AWV. Unable to LVM.  Will try at later time.   Called patient to schedule Annual Wellness Visit.  Please schedule with Nurse Health Advisor Caroleen Hamman, RN at [facility]

## 2020-12-16 NOTE — Telephone Encounter (Signed)
-----   Message from Yevette Edwards, RN sent at 11/29/2020 11:32 AM EST ----- Regarding: Labs Repeat BMET, order in epic.

## 2020-12-18 ENCOUNTER — Other Ambulatory Visit: Payer: Medicare HMO | Admitting: Nurse Practitioner

## 2020-12-18 ENCOUNTER — Other Ambulatory Visit: Payer: Self-pay

## 2020-12-18 ENCOUNTER — Ambulatory Visit (HOSPITAL_COMMUNITY)
Admission: RE | Admit: 2020-12-18 | Discharge: 2020-12-18 | Disposition: A | Discharge status: Home / Self Care | Payer: Medicare HMO | Source: Ambulatory Visit | Attending: Gastroenterology | Admitting: Gastroenterology

## 2020-12-18 ENCOUNTER — Telehealth: Payer: Self-pay | Admitting: Nurse Practitioner

## 2020-12-18 ENCOUNTER — Telehealth: Payer: Self-pay

## 2020-12-18 ENCOUNTER — Other Ambulatory Visit (INDEPENDENT_AMBULATORY_CARE_PROVIDER_SITE_OTHER): Payer: Medicare HMO

## 2020-12-18 DIAGNOSIS — K7460 Unspecified cirrhosis of liver: Secondary | ICD-10-CM

## 2020-12-18 DIAGNOSIS — R188 Other ascites: Secondary | ICD-10-CM

## 2020-12-18 HISTORY — PX: IR PARACENTESIS: IMG2679

## 2020-12-18 LAB — BASIC METABOLIC PANEL
BUN: 13 mg/dL (ref 6–23)
CO2: 23 mEq/L (ref 19–32)
Calcium: 8.5 mg/dL (ref 8.4–10.5)
Chloride: 98 mEq/L (ref 96–112)
Creatinine, Ser: 0.51 mg/dL (ref 0.40–1.20)
GFR: 100.16 mL/min (ref >60.00)
Glucose, Bld: 128 mg/dL — ABNORMAL HIGH (ref 70–99)
Potassium: 4 mEq/L (ref 3.5–5.1)
Sodium: 129 mEq/L — ABNORMAL LOW (ref 135–145)

## 2020-12-18 MED ORDER — LIDOCAINE HCL 1 % IJ SOLN
INTRAMUSCULAR | Status: AC
  Filled 2020-12-18: qty 20

## 2020-12-18 MED ORDER — ALBUMIN HUMAN 25 % IV SOLN
50.0000 g | Freq: Once | INTRAVENOUS | Status: AC
  Administered 2020-12-18: 50 g via INTRAVENOUS
  Filled 2020-12-18: qty 200

## 2020-12-18 MED ORDER — LIDOCAINE HCL 1 % IJ SOLN
INTRAMUSCULAR | Status: DC | PRN
  Administered 2020-12-18: 10 mL

## 2020-12-18 MED ORDER — ALBUMIN HUMAN 25 % IV SOLN
INTRAVENOUS | Status: AC
  Filled 2020-12-18: qty 200

## 2020-12-18 NOTE — Telephone Encounter (Signed)
Patient said she was returning call from yesterday.  I could not find documentation from yesterday other than a letter was mailed. Some letters go to patient's mychart, so maybe she was calling back from the letter?  I did verify patient's home/cell number because our office has had several unsuccessful attempts to reach her by phone.  Patient can be reached at [phone].  Thank you

## 2020-12-18 NOTE — Telephone Encounter (Signed)
Patient called regarding conversation between patient and Dr. Anitra Lauth at most recent [place].  She is requesting letter for a "discount" on a bed/mattress for her medical condition.  She needs so she can turn into [place].  Please call patient with any questions. Patient can be reached at [phone].  Thank you

## 2020-12-18 NOTE — Telephone Encounter (Signed)
Pt called for scheduling labs. Pt scheduled for Monday

## 2020-12-18 NOTE — Procedures (Signed)
PROCEDURE SUMMARY:  Successful image-guided paracentesis from the left lower abdomen.  Yielded 6.2 liters of clear yellow fluid.  No immediate complications.  EBL = trace. Patient tolerated well.   Specimen was not sent for labs.  Please see imaging section of Epic for full dictation.   Armando Gang Lianny Molter PA-C 12/18/2020 11:17 AM

## 2020-12-18 NOTE — Telephone Encounter (Signed)
Verbally discussed with PCP. Nothing was mentioned in note regarding this: "9) Debilitated patient: tried to get [facility] set up in the past but somehow this never happened. Will re-order (HH PT/OT/nursing, ? DME--pt states she needs new mattress for her bed". We are not able to provide letter for discount. Insurance coverage is 80/20. Pt is normally responsible for the 20%

## 2020-12-19 ENCOUNTER — Ambulatory Visit (HOSPITAL_COMMUNITY): Payer: Medicare HMO

## 2020-12-19 LAB — DRUG MONITORING, PANEL 8 WITH CONFIRMATION, URINE
6 Acetylmorphine: NEGATIVE ng/mL (ref <10)
Alcohol Metabolites: NEGATIVE ng/mL
Alphahydroxyalprazolam: NEGATIVE ng/mL (ref <25)
Alphahydroxymidazolam: NEGATIVE ng/mL (ref <50)
Alphahydroxytriazolam: NEGATIVE ng/mL (ref <50)
Aminoclonazepam: NEGATIVE ng/mL (ref <25)
Amphetamines: NEGATIVE ng/mL (ref <500)
Benzodiazepines: NEGATIVE ng/mL (ref <100)
Buprenorphine, Urine: POSITIVE ng/mL — AB (ref <5)
Buprenorphine: 147.5 ng/mL — ABNORMAL HIGH (ref <2)
Cocaine Metabolite: NEGATIVE ng/mL (ref <150)
Creatinine: 114.5 mg/dL
Hydroxyethylflurazepam: NEGATIVE ng/mL (ref <50)
Lorazepam: NEGATIVE ng/mL (ref <50)
MDMA: NEGATIVE ng/mL (ref <500)
Marijuana Metabolite: NEGATIVE ng/mL (ref <20)
Naloxone: NEGATIVE ng/mL (ref <2)
Norbuprenorphine: 97.6 ng/mL — ABNORMAL HIGH (ref <2)
Nordiazepam: NEGATIVE ng/mL (ref <50)
Opiates: NEGATIVE ng/mL (ref <100)
Oxazepam: NEGATIVE ng/mL (ref <50)
Oxidant: NEGATIVE ug/mL
Oxycodone: NEGATIVE ng/mL (ref <100)
Temazepam: NEGATIVE ng/mL (ref <50)
pH: 7.3 (ref 4.5–9.0)

## 2020-12-19 LAB — DRUG MONITOR, OPIATESEXPANDED, QN, URINE
Codeine: NEGATIVE ng/mL (ref <50)
Hydrocodone: NEGATIVE ng/mL (ref <50)
Hydromorphone: NEGATIVE ng/mL (ref <50)
Morphine: NEGATIVE ng/mL (ref <50)
Norhydrocodone: NEGATIVE ng/mL (ref <50)
Noroxycodone: 843 ng/mL — ABNORMAL HIGH (ref <50)
Oxycodone: NEGATIVE ng/mL (ref <50)
Oxymorphone: NEGATIVE ng/mL (ref <50)

## 2020-12-19 LAB — URINE CULTURE
MICRO NUMBER:: 11503385
SPECIMEN QUALITY:: ADEQUATE

## 2020-12-19 LAB — DM TEMPLATE

## 2020-12-19 NOTE — Telephone Encounter (Signed)
Patient advised and voiced understanding.  

## 2020-12-19 NOTE — Telephone Encounter (Signed)
LM for pt to return call regarding letter requested.

## 2020-12-20 ENCOUNTER — Other Ambulatory Visit: Payer: Self-pay

## 2020-12-20 ENCOUNTER — Telehealth: Payer: Self-pay

## 2020-12-20 DIAGNOSIS — K7460 Unspecified cirrhosis of liver: Secondary | ICD-10-CM

## 2020-12-20 DIAGNOSIS — R188 Other ascites: Secondary | ICD-10-CM

## 2020-12-20 NOTE — Telephone Encounter (Signed)
Spoke with husband and he does not feel like it is necessary that she go to ED. He states that it happens a lot and he will look monitor over the weekend. Appointment offered and he did not want to schedule yet because he is starting a new job and does not want to have to cancel appt later. If he feels the need to make an appt he will give [place] a call.

## 2020-12-20 NOTE — Telephone Encounter (Signed)
[facility] - Client TELEPHONE ADVICE RECORD [facility] Patient Name: Megan Roberson Gender: Female DOB: [birth year] Age: 65 Y 3 M 2 D Return Phone Number: [phone] (Primary), [phone] (Secondary) Address[place] [address] Client [name] - Client Client Site [place] - Day Physician Crissie Sickles - MD Contact Type Call Who Is Calling Patient / Member / Family / Caregiver Call Type Triage / Clinical Caller Name Fiorela Pelzer Relationship To Patient Spouse Return Phone Number [phone] (Primary) Chief Complaint SPEAK - sudden inability to talk or slurred speech Reason for Call Symptomatic / Request for [place] states his wife is not communicating like she should be, with slurred speech. Caller states she has chrosis of the liver. Translation No Nurse Assessment Nurse: Guadlupe Spanish, RN, Otila Kluver Date/Time (Eastern Time): 12/20/2020 10:52:37 AM Confirm and document reason for call. If symptomatic, describe symptoms. ---Caller states his wife is not communicating like she should be, with slurred speech. Caller states she has cirrhosis of the liver. Spoke with spouse who denies slurred speech but is having difficulty "putting words together" correctly for approx x 1 wk. Stated that she has this issue intermittently related to "toxins in the brain". Stated that she's weak and thin "emaciated". Stated that abdomen is distended as well and that fluid was drained last week. Stated that she is not ill but seems weak. Does the patient have any new or worsening symptoms? ---Yes Will a triage be completed? ---Yes Related visit to physician within the last 2 weeks? ---Yes Does the PT have any chronic conditions? (i.e. diabetes, asthma, this includes High risk factors for pregnancy, etc.) ---Yes List chronic conditions. ---cirrhosis of the liver thyroid depression  insomnia Is this a behavioral health or substance abuse call? ---No Guidelines Guideline Title Affirmed Question Affirmed Notes Nurse Date/Time (Eastern Time) Confusion - Delirium Patient sounds very sick or weak to the triager [place], RN, Otila Kluver 12/20/2020 10:58:20 AM Disp. Time Eilene Ghazi Time) Disposition Final User 12/20/2020 10:50:36 AM Send to Urgent Queue Peggye Fothergill PLEASE NOTE: All timestamps contained within this report are represented as [place] Standard Time. CONFIDENTIALTY NOTICE: This fax transmission is intended only for the addressee. It contains information that is legally privileged, confidential or otherwise protected from use or disclosure. If you are not the intended recipient, you are strictly prohibited from reviewing, disclosing, copying using or disseminating any of this information or taking any action in reliance on or regarding this information. If you have received this fax in error, please notify us immediately by telephone so that we can arrange for its return to [place]. Phone: [phone], Toll-Free: [phone], Fax: [phone] Page: 2 of 2 Call Id: [id number] 12/20/2020 11:03:12 AM Go to ED Now (or PCP triage) Yes Guadlupe Spanish, RN, [place] Stage Disagree/Comply Comply Caller Understands Yes PreDisposition Did not know what to do Care Advice Given Per Guideline GO TO ED NOW (OR PCP TRIAGE): Referrals GO TO FACILITY UNDECIDED

## 2020-12-23 ENCOUNTER — Telehealth: Payer: Self-pay | Admitting: Family Medicine

## 2020-12-23 ENCOUNTER — Emergency Department (HOSPITAL_COMMUNITY)
Admission: EM | Admit: 2020-12-23 | Discharge: 2020-12-24 | Disposition: A | Discharge status: Home / Self Care | Payer: Medicare HMO | Attending: Emergency Medicine | Admitting: Emergency Medicine

## 2020-12-23 ENCOUNTER — Telehealth: Payer: Self-pay

## 2020-12-23 ENCOUNTER — Emergency Department (HOSPITAL_COMMUNITY): Payer: Medicare HMO

## 2020-12-23 ENCOUNTER — Ambulatory Visit: Payer: Medicare HMO

## 2020-12-23 ENCOUNTER — Encounter (HOSPITAL_COMMUNITY): Payer: Self-pay | Admitting: *Deleted

## 2020-12-23 ENCOUNTER — Other Ambulatory Visit: Payer: Self-pay

## 2020-12-23 DIAGNOSIS — S81812A Laceration without foreign body, left lower leg, initial encounter: Secondary | ICD-10-CM | POA: Diagnosis not present

## 2020-12-23 DIAGNOSIS — E039 Hypothyroidism, unspecified: Secondary | ICD-10-CM | POA: Insufficient documentation

## 2020-12-23 DIAGNOSIS — W1839XA Other fall on same level, initial encounter: Secondary | ICD-10-CM | POA: Insufficient documentation

## 2020-12-23 DIAGNOSIS — S51812A Laceration without foreign body of left forearm, initial encounter: Secondary | ICD-10-CM | POA: Diagnosis not present

## 2020-12-23 DIAGNOSIS — R1032 Left lower quadrant pain: Secondary | ICD-10-CM | POA: Insufficient documentation

## 2020-12-23 DIAGNOSIS — W19XXXA Unspecified fall, initial encounter: Secondary | ICD-10-CM

## 2020-12-23 DIAGNOSIS — Z79899 Other long term (current) drug therapy: Secondary | ICD-10-CM | POA: Diagnosis not present

## 2020-12-23 DIAGNOSIS — Y92002 Bathroom of unspecified non-institutional (private) residence single-family (private) house as the place of occurrence of the external cause: Secondary | ICD-10-CM | POA: Insufficient documentation

## 2020-12-23 DIAGNOSIS — S59912A Unspecified injury of left forearm, initial encounter: Secondary | ICD-10-CM | POA: Diagnosis present

## 2020-12-23 DIAGNOSIS — S0990XA Unspecified injury of head, initial encounter: Secondary | ICD-10-CM | POA: Diagnosis present

## 2020-12-23 LAB — COMPREHENSIVE METABOLIC PANEL
ALT: 19 U/L (ref 0–44)
AST: 38 U/L (ref 15–41)
Albumin: 3 g/dL — ABNORMAL LOW (ref 3.5–5.0)
Alkaline Phosphatase: 131 U/L — ABNORMAL HIGH (ref 38–126)
Anion gap: 7 (ref 5–15)
BUN: 14 mg/dL (ref 8–23)
CO2: 22 mmol/L (ref 22–32)
Calcium: 8.2 mg/dL — ABNORMAL LOW (ref 8.9–10.3)
Chloride: 103 mmol/L (ref 98–111)
Creatinine, Ser: 0.51 mg/dL (ref 0.44–1.00)
GFR, Estimated: >60 mL/min (ref >60)
Glucose, Bld: 97 mg/dL (ref 70–99)
Potassium: 4.1 mmol/L (ref 3.5–5.1)
Sodium: 132 mmol/L — ABNORMAL LOW (ref 135–145)
Total Bilirubin: 4.6 mg/dL — ABNORMAL HIGH (ref 0.3–1.2)
Total Protein: 5.6 g/dL — ABNORMAL LOW (ref 6.5–8.1)

## 2020-12-23 LAB — CBC WITH DIFFERENTIAL/PLATELET
Abs Immature Granulocytes: 0.08 10*3/uL — ABNORMAL HIGH (ref 0.00–0.07)
Basophils Absolute: 0 10*3/uL (ref 0.0–0.1)
Basophils Relative: 1 %
Eosinophils Absolute: 0 10*3/uL (ref 0.0–0.5)
Eosinophils Relative: 0 %
HCT: 27.8 % — ABNORMAL LOW (ref 36.0–46.0)
Hemoglobin: 8.3 g/dL — ABNORMAL LOW (ref 12.0–15.0)
Immature Granulocytes: 1 %
Lymphocytes Relative: 8 %
Lymphs Abs: 0.5 10*3/uL — ABNORMAL LOW (ref 0.7–4.0)
MCH: 27.6 pg (ref 26.0–34.0)
MCHC: 29.9 g/dL — ABNORMAL LOW (ref 30.0–36.0)
MCV: 92.4 fL (ref 80.0–100.0)
Monocytes Absolute: 1 10*3/uL (ref 0.1–1.0)
Monocytes Relative: 16 %
Neutro Abs: 4.8 10*3/uL (ref 1.7–7.7)
Neutrophils Relative %: 74 %
Platelets: 39 10*3/uL — ABNORMAL LOW (ref 150–400)
RBC: 3.01 MIL/uL — ABNORMAL LOW (ref 3.87–5.11)
RDW: 21.6 % — ABNORMAL HIGH (ref 11.5–15.5)
WBC: 6.4 10*3/uL (ref 4.0–10.5)
nRBC: 0 % (ref 0.0–0.2)

## 2020-12-23 LAB — AMMONIA: Ammonia: 57 umol/L — ABNORMAL HIGH (ref 9–35)

## 2020-12-23 MED ORDER — IOHEXOL 300 MG/ML  SOLN
75.0000 mL | Freq: Once | INTRAMUSCULAR | Status: AC | PRN
  Administered 2020-12-23: 75 mL via INTRAVENOUS

## 2020-12-23 NOTE — Telephone Encounter (Signed)
Labs can not be done using labs from 11/29/20. Labs can be done on 01/02/21

## 2020-12-23 NOTE — Discharge Instructions (Signed)
Seen here after a fall.  Lab work imaging all looks reassuring.  Recommend rinsing out the wound and changing the dressings twice a day.  You may put antiseptic ointment on it.  May take NSAIDs for pain management please follow dosing the back of bottle.  Would like to follow-up with your primary care provider in 1 week's time for reevaluation of your skin tears.  Come back to the emergency department if you develop chest pain, shortness of breath, severe abdominal pain, uncontrolled nausea, vomiting, diarrhea.

## 2020-12-23 NOTE — ED Provider Notes (Signed)
[facility] EMERGENCY DEPARTMENT Provider Note   CSN: [id number] Arrival date & time: 12/23/20  1739     History Chief Complaint  Patient presents with  . Fall    Megan Roberson is a 65 y.o. female.  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  Patient endorses that she went to use the restroom, lost her balance causing her to fall onto her left side.  She denies hitting her head, losing conscious, is not on anticoagulant.  She endorses that she landed on her left arm and has some skin abrasions on her forearm as well as her left leg.  Patient denies change in vision, paresthesia or weakness the upper lower extremities.  She denies any alleviating factors.  She also endorses that she has some drainage from her abdomen, she states she had a paracentesis performed last week and since then she has had worsening left lower quadrant pain with some drainage of clear fluids.  She states is had this past but does not remember what caused it.  She denies nausea, vomiting, diarrhea, urinary symptoms, states her abdomen does not look larger than usual.  Patient has no other complaints at this time.  Patient denies headaches, fevers, chills, shortness of breath, chest pain, worsening pedal edema.  Past Medical History:  Diagnosis Date  . Anxiety and depression   . Cholelithiasis without cholecystitis   . Chronic abdominal pain   . Chronic nausea    + abd pain  . Cirrhosis of liver with ascites (HCC)    Alc cirrhosis; hx of multiple abd paracentesis, +esoph varices (+banding), pancytopenia, hx of hepatic enceph, hx of hydrothorax  . Esophageal candidiasis ([place]) 11/2020   Dr. Noreene Larsson (difluc contraind b/c prolonged QT)  . Heart murmur    Echo 10/06/17 [facility]): Normal LV/RV size, basal septal hypertophy-normal  variant, LVEF 65-70%, normal LV/RV function, est RAP 5 mmHg, no sign valvular stenosis or regurg--trace MR/TR/PR  . History of abnormal cervical Pap smear   . History of pneumonia   . Hypothyroidism   . Insomnia   . Nephrolithiasis    right renal pelvis, 13-64m nonobst stone  . Neuropathy    unclear hx  . Osteoarthritis, multiple sites    primarily hands and knees  . Osteoporosis 02/09/2018  . Ovarian cyst    bilat.  Stable on MRI pelvis 11/2019 and CT abd/pelv 09/2020--no f/u imaging indicated.  . Pancytopenia ([place]   . Prolonged QT interval   . Splenomegaly   . Thrombocytopenia ([place] 12/01/2019   severe, hx of plts down into 30s    Patient Active Problem List   Diagnosis Date Noted  . Pain in left ankle and joints of left foot 12/04/2020  . Gastric ulcer   . Closed traumatic minimally displaced fracture of left calcaneus 09/23/2020  . Hydrothorax   . Ascites 09/03/2020  . Closed left ankle fracture 09/03/2020  . Mood disorder ([place] 08/17/2020  . Right lower lobe pneumonia 08/16/2020  . Asymptomatic bacteriuria   . Hypothyroidism 08/14/2020  . Displaced fracture of medial cuneiform of left foot, initial encounter for closed fracture 08/14/2020  . Failure to thrive in adult 08/14/2020  . Weight loss 08/14/2020  . UTI (urinary tract infection) 08/14/2020  . Fall at home, initial encounter 08/14/2020  . Generalized weakness 08/13/2020  . Pain in right knee 06/04/2020  . Pain in  left hip 06/04/2020  . Nausea 05/19/2020  . Prolonged QT interval 05/19/2020  . Elevated brain natriuretic peptide (BNP) level 05/19/2020  . Hypoalbuminemia 05/19/2020  . Cough 05/19/2020  . Atelectasis 05/19/2020  . Hypomagnesemia 05/19/2020  . Cysts of both ovaries 06/15/2019  . Lumbar pain 02/27/2019  . Closed fracture of left tibial plateau 05/17/18 07/08/2018  . Esophageal varices without bleeding ([place])   . Osteoporosis 02/09/2018  . Goals of care, counseling/discussion 01/18/2018  .  Refractory ascites 11/25/2017  . Cellulitis, abdominal wall 11/25/2017  . SOB (shortness of breath) 11/25/2017  . Protein-calorie malnutrition, severe ([place]) 11/06/2017  . Ogilvie's syndrome   . Abdominal distension   . Abdominal pain   . Liver failure without hepatic coma ([place])   . Thrombocytopenia ([place])   . Palliative care by specialist   . Closed fracture of lumbar spine without lesion of spinal cord ([place])   . Recurrent falls 06/04/2017  . 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 Cirrhosis of liver with ascites ([place]) 04/21/2017  . Pancytopenia ([place]) 04/21/2017  . History of open reduction and internal fixation (ORIF) procedure 04/21/2017  . Visit for suture removal 04/21/2017  . Left Olecranon fracture 04/21/2017  . Hypokalemia 04/21/2017    Past Surgical History:  Procedure Laterality Date  . AUGMENTATION MAMMAPLASTY Bilateral    20 years ago  . BIOPSY  03/15/2018   gastric.  Procedure: BIOPSY;  Surgeon: Yetta Flock, MD;  Location: [facility];  Service: Gastroenterology;;  Gastric  . BIOPSY  11/14/2020   Procedure: BIOPSY;  Surgeon: Yetta Flock, MD;  Location: [facility];  Service: Gastroenterology;;  . COLONOSCOPY  2019  . COLONOSCOPY WITH ESOPHAGOGASTRODUODENOSCOPY (EGD) AND ESOPHAGEAL DILATION (ED)    . COLONOSCOPY WITH PROPOFOL N/A 03/15/2018   Redundant colon, severe looping, very edematous colon c/w portal HTN.  Procedure: COLONOSCOPY WITH PROPOFOL;  Surgeon: Yetta Flock, MD;  Location: [facility];  Service: Gastroenterology;  Laterality: N/A;  . ESOPHAGOGASTRODUODENOSCOPY  11/14/2020   esoph candidiasis, nonbleeding ulcer (path benign), portal hypertensive gastropathy.  No varices.  . ESOPHAGOGASTRODUODENOSCOPY (EGD) WITH PROPOFOL N/A 03/15/2018   Gastritis, small gastric ulcer. Procedure: ESOPHAGOGASTRODUODENOSCOPY (EGD) WITH PROPOFOL;  Surgeon: Yetta Flock, MD;  Location: [facility];  Service: Gastroenterology;  Laterality: N/A;   . ESOPHAGOGASTRODUODENOSCOPY (EGD) WITH PROPOFOL N/A 11/14/2020   Procedure: ESOPHAGOGASTRODUODENOSCOPY (EGD) WITH PROPOFOL;  Surgeon: Yetta Flock, MD;  Location: [facility];  Service: Gastroenterology;  Laterality: N/A;  . ESOPHAGOGASTRODUODENOSCOPY W/ BANDING     Varices  . FRACTURE SURGERY Left    x 2 L elbow and hip left   . HARDWARE REMOVAL Left 09/17/2017   Procedure: HARDWARE REMOVAL LEFT OLECRANON;  Surgeon: Altamese [place]el [place], MD;  Location: [place];  Service: Orthopedics;  Laterality: Left;  . IR PARACENTESIS  11/18/2017  . IR PARACENTESIS  11/26/2017  . IR PARACENTESIS  03/14/2018  . IR PARACENTESIS  08/08/2020  . IR PARACENTESIS  09/23/2020  . IR PARACENTESIS  10/22/2020  . IR PARACENTESIS  10/30/2020  . IR PARACENTESIS  11/12/2020  . IR PARACENTESIS  11/20/2020  . IR PARACENTESIS  11/27/2020  . IR PARACENTESIS  12/05/2020  . IR PARACENTESIS  12/12/2020  . IR PARACENTESIS  12/18/2020  . KNEE SURGERY Right    open incision for ligaments  . ORIF ELBOW FRACTURE Left 04/30/2017   Procedure: REVISION OPEN REDUCTION INTERNAL FIXATION (ORIF) ELBOW/OLECRANON FRACTURE;  Surgeon: Altamese Crockett, MD;  Location: [place];  Service: Orthopedics;  Laterality: Left;  .  ORIF HIP FRACTURE Left   . REPAIR EXTENSOR TENDON Left 12/29/2019   Procedure: LEFT LONG AND LEFT RING FRINGER EXTENSOR REALIGNAMENT WITH POSSIBLE TENDON TRANSFER;  Surgeon: Charlotte Crumb, MD;  Location: [place];  Service: Orthopedics;  Laterality: Left;  . TRANSTHORACIC ECHOCARDIOGRAM  05/19/2020   extremely poor acoustic window/technically poor study-->normal  . virtual colonoscopy  04/24/2019   no polyps, no mass, no apple core lesion, no stricture     OB History    Gravida  2   Para  2   Term  2   Preterm      AB      Living  1     SAB      IAB      Ectopic      Multiple      Live Births  2           Family History  Adopted: Yes  Problem Relation Age of Onset  . Alcohol abuse Daughter      Social History   Tobacco Use  . Smoking status: Never Smoker  . Smokeless tobacco: Never Used  Vaping Use  . Vaping Use: Never used  Substance Use Topics  . Alcohol use: No  . Drug use: No    Home Medications Prior to Admission medications   Medication Sig Start Date End Date Taking? Authorizing Provider  diclofenac Sodium (VOLTAREN) 1 % GEL Apply 2 g topically daily as needed (for pain). 04/23/20   [provider]  feeding supplement (ENSURE ENLIVE / ENSURE PLUS) LIQD Take 237 mLs by mouth 2 (two) times daily between meals. 09/08/20   Hosie Poisson, MD  ferrous sulfate 325 (65 FE) MG EC tablet Take 1 tablet (325 mg total) by mouth in the morning and at bedtime. 09/17/20   Armbruster, Carlota Raspberry, MD  lactulose (CHRONULAC) 10 GM/15ML solution Take 30 mLs (20 g total) by mouth 3 (three) times daily. 09/08/20   Hosie Poisson, MD  levothyroxine (SYNTHROID, LEVOTHROID) 75 MCG tablet Take 1 tablet (75 mcg total) by mouth daily before breakfast. 12/02/17   Aline August, MD  Multiple Vitamin (MULTIVITAMIN WITH MINERALS) TABS tablet Take 1 tablet by mouth daily. 09/09/20   Hosie Poisson, MD  nitrofurantoin, macrocrystal-monohydrate, (MACROBID) 100 MG capsule Take 1 capsule (100 mg total) by mouth 2 (two) times daily. 12/09/20   McGowen, Adrian Blackwater, MD  omeprazole (PRILOSEC) 40 MG capsule Take 1 capsule (40 mg total) by mouth 2 (two) times daily for 240 doses. 11/14/20 03/14/21  Armbruster, Carlota Raspberry, MD  ondansetron (ZOFRAN ODT) 4 MG disintegrating tablet Take 1 tablet (4 mg total) by mouth every 8 (eight) hours as needed for nausea or vomiting. 11/20/20   McGowen, Adrian Blackwater, MD  Oxycodone HCl 10 MG TABS 1 tab po tid prn pain 12/09/20   McGowen, Adrian Blackwater, MD  potassium chloride SA (KLOR-CON) 20 MEQ tablet Take 2 tablets (40 mEq total) by mouth daily. 09/08/20   Hosie Poisson, MD  sertraline (ZOLOFT) 100 MG tablet Take 100 mg by mouth daily. 08/17/20   [provider]  sertraline (ZOLOFT) 50  MG tablet Take 1 tablet (50 mg total) by mouth daily. 08/21/20   Nita Sells, MD  spironolactone (ALDACTONE) 100 MG tablet Take 2 tablets (200 mg total) by mouth daily. 09/09/20   Hosie Poisson, MD  Suvorexant (BELSOMRA) 10 MG TABS 1 tab po about 30-60 min prior to bedtime nightly 12/09/20   McGowen, Adrian Blackwater, MD    Allergies  Penicillins, Sulfa antibiotics, Tramadol, and Tylenol [acetaminophen]  Review of Systems   Review of Systems  Constitutional: Negative for chills and fever.  HENT: Negative for congestion and sore throat.   Eyes: Negative for visual disturbance.  Respiratory: Negative for shortness of breath.   Cardiovascular: Negative for chest pain.  Gastrointestinal: Positive for abdominal pain. Negative for diarrhea, nausea, rectal pain and vomiting.  Genitourinary: Negative for difficulty urinating and enuresis.  Musculoskeletal: Negative for back pain and neck pain.  Skin: Negative for rash.  Neurological: Negative for dizziness and headaches.  Hematological: Does not bruise/bleed easily.    Physical Exam Updated Vital Signs BP 95/81   Pulse 75   Temp 98.3 F (36.8 C) (Oral)   Resp 16   Ht 5' 7"  (1.702 m)   Wt 54.4 kg   SpO2 97%   BMI 18.79 kg/m   Physical Exam Vitals and nursing note reviewed.  Constitutional:      General: She is not in acute distress.    Appearance: She is not ill-appearing.     Comments: Patient is in a deconditioned state.  HENT:     Head: Normocephalic and atraumatic.     Nose: No congestion.  Eyes:     Conjunctiva/sclera: Conjunctivae normal.  Cardiovascular:     Rate and Rhythm: Normal rate and regular rhythm.     Pulses: Normal pulses.     Heart sounds: No murmur heard. No friction rub. No gallop.   Pulmonary:     Effort: No respiratory distress.     Breath sounds: No wheezing, rhonchi or rales.  Abdominal:     General: There is distension.     Palpations: Abdomen is soft.     Tenderness: There is abdominal  tenderness. There is no right CVA tenderness or left CVA tenderness.     Comments: Patient abdomen is distended, clear drainage coming from her left lower quadrant, no other skin changes present.  Normoactive bowel sounds, dull to percussion, she has general tenderness to palpation in her abdomen, more localized in her left lower quadrant, negative peritoneal sign, no McBurney point or Murphy sign.  She had a positive fluid wave.   Musculoskeletal:     Cervical back: Normal range of motion. No rigidity.     Right lower leg: No edema.     Left lower leg: No edema.     Comments: Patient spine was palpated, it was nontender to palpation, no step-off deformities present.  Patient is moving all 4 extremities at difficulty.  Skin:    General: Skin is warm and dry.     Findings: Lesion present.     Comments: Patient has noted skin tears along her left anterior aspect of her forearm as well as the lateral aspect of her left lower leg.  As well as hematomas along her left forearm and left lower leg.  Neurological:     Mental Status: She is alert.  Psychiatric:        Mood and Affect: Mood normal.            ED Results / Procedures / Treatments   Labs (all labs ordered are listed, but only abnormal results are displayed) Labs Reviewed  CBC WITH DIFFERENTIAL/PLATELET - Abnormal; Notable for the following components:      Result Value   RBC 3.01 (*)    Hemoglobin 8.3 (*)    HCT 27.8 (*)    MCHC 29.9 (*)    RDW 21.6 (*)  Platelets 39 (*)    Lymphs Abs 0.5 (*)    Abs Immature Granulocytes 0.08 (*)    All other components within normal limits  COMPREHENSIVE METABOLIC PANEL - Abnormal; Notable for the following components:   Sodium 132 (*)    Calcium 8.2 (*)    Total Protein 5.6 (*)    Albumin 3.0 (*)    Alkaline Phosphatase 131 (*)    Total Bilirubin 4.6 (*)    All other components within normal limits  AMMONIA - Abnormal; Notable for the following components:   Ammonia 57 (*)     All other components within normal limits    EKG None  Radiology CT Head Wo Contrast  Result Date: 12/23/2020 CLINICAL DATA:  Status post fall. EXAM: CT HEAD WITHOUT CONTRAST TECHNIQUE: Contiguous axial images were obtained from the base of the skull through the vertex without intravenous contrast. COMPARISON:  August 13, 2020 FINDINGS: Brain: There is mild cerebral atrophy with widening of the extra-axial spaces and ventricular dilatation. There are areas of decreased attenuation within the white matter tracts of the supratentorial brain, consistent with microvascular disease changes. Vascular: No hyperdense vessel or unexpected calcification. Skull: Normal. Negative for fracture or focal lesion. Sinuses/Orbits: No acute finding. Other: None. IMPRESSION: 1. Mild cerebral atrophy. 2. No acute intracranial abnormality. Electronically Signed   By: Virgina Norfolk M.D.   On: 12/23/2020 23:05   CT Cervical Spine Wo Contrast  Result Date: 12/23/2020 CLINICAL DATA:  Status post fall. EXAM: CT CERVICAL SPINE WITHOUT CONTRAST TECHNIQUE: Multidetector CT imaging of the cervical spine was performed without intravenous contrast. Multiplanar CT image reconstructions were also generated. COMPARISON:  July 07, 2020 FINDINGS: Alignment: Approximately 1 mm to 2 mm anterolisthesis of the C4 vertebral body is noted on C5. Approximately 2 mm retrolisthesis of the C5 vertebral body is noted on C6. Skull base and vertebrae: No acute fracture. No primary bone lesion or focal pathologic process. Soft tissues and spinal canal: No prevertebral fluid or swelling. No visible canal hematoma. Disc levels: Moderate severity endplate sclerosis is seen at the levels of C4-C5, C5-C6 and C6-C7. Mild intervertebral disc space narrowing is present at the levels of C2-C3 and C3-C4. Marked severity intervertebral disc space narrowing is seen at the levels of C4-C5, C5-C6 and C6-C7. Bilateral moderate severity multilevel facet  joint hypertrophy is noted. Upper chest: Negative. Other: None. IMPRESSION: 1. Marked severity multilevel degenerative changes, as described above. 2. Approximately 1 mm to 2 mm anterolisthesis of the C4 vertebral body on C5. 3. Approximately 2 mm retrolisthesis of the C5 vertebral body on C6. 4. No acute cervical spine fracture. Electronically Signed   By: Virgina Norfolk M.D.   On: 12/23/2020 23:08   CT ABDOMEN PELVIS W CONTRAST  Result Date: 12/23/2020 CLINICAL DATA:  Abdominal distension EXAM: CT ABDOMEN AND PELVIS WITH CONTRAST TECHNIQUE: Multidetector CT imaging of the abdomen and pelvis was performed using the standard protocol following bolus administration of intravenous contrast. CONTRAST:  66m OMNIPAQUE IOHEXOL 300 MG/ML  SOLN COMPARISON:  11/02/2020 CT, 09/03/2020, 08/15/2020 FINDINGS: Lower chest: Lung bases demonstrate partially visualized bilateral breast implants. Loculated fluid collection along the right lateral chest measures 8 x 2.7 cm and is unchanged in size. Small left-sided pleural effusion with dependent atelectasis. Hepatobiliary: Cirrhosis of the liver. Multiple calcified gallstones. No biliary dilatation Pancreas: Unremarkable. No pancreatic ductal dilatation or surrounding inflammatory changes. Spleen: Enlarged, measuring 16 cm craniocaudad. Adrenals/Urinary Tract: Adrenal glands are normal. Kidneys show minimal right hydronephrosis with stable  14 mm right pelvic stone. Subcentimeter hypodensity within the mid left kidney too small to further characterize. The bladder is unremarkable Stomach/Bowel: The stomach is nonenlarged. No dilated small bowel. Worsened diffuse colon wall thickening, most evident involving the right colon, transverse colon and descending colon. Vascular/Lymphatic: Nonaneurysmal aorta. Subcentimeter retroperitoneal nodes. Small gastroesophageal and perirectal varices. Recanalized paraumbilical vein. Reproductive: Uterus unremarkable. Stable 2.1 cm right  adnexal cyst and 1.3 cm left adnexal cyst. Other: No free air. Moderate to large volume of ascites within the abdomen and pelvis. Generalized body wall edema with subcutaneous fluid. Musculoskeletal: Chronic compression deformity of L5 and at the superior endplates of X45 and L1. Chronic fracture deformity of the sacrum. Old fracture deformity of the left iliac bone. Chronic fracture deformity of left inferior pubic ramus. Acute to subacute fracture deformity of left inferior pubic ramus slightly more posterior to the chronic deformity. IMPRESSION: 1. 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 Moderate to large volume of ascites within the abdomen and pelvis. Similar small left-sided pleural effusion. Generalized subcutaneous fluid and edema consistent with anasarca. 2. Slight worsening of diffuse colon wall thickening which could be due to portal colopathy although colitis could also produce this appearance. 3. Multiple chronic fracture deformities. There is an acute to subacute appearing fracture involving the left inferior pubic ramus posterior to a chronic fracture deformity. 4. Multiple gallstones 5. Stable right renal pelvis calcification with minimal right hydronephrosis. 6. Stable bilateral ovarian cysts measuring up to 2.1 cm. No follow-up imaging recommended. Note: This recommendation does not apply to premenarchal patients and to those with increased risk (genetic, family history, elevated tumor markers or other high-risk factors) of ovarian cancer. Reference: JACR 2020 Feb; 17(2):248-254 Electronically Signed   By: Donavan Foil M.D.   On: 12/23/2020 23:14    Procedures Procedures   Medications Ordered in ED Medications  iohexol (OMNIPAQUE) 300 MG/ML solution 75 mL (75 mLs Intravenous Contrast Given 12/23/20 2237)    ED Course  I have reviewed the triage vital signs and the nursing notes.  Pertinent labs & imaging results that were available during my care of the patient  were reviewed by me and considered in my medical decision making (see chart for details).    MDM Rules/Calculators/A&P                          Initial impression-patient presents after a mechanical fall with left arm and leg pain, abdominal pain.  She is alert, does not appear acute distress, vital signs reassuring.  Due to her coagulopathy from her cirrhosis will obtain CT of her head, neck, add on ammonia levels and check basic lab work.  Will obtain CT abdomen for further evaluation of this left lower quadrant pain.  Work-up-CBC shows leukocytosis appears to be a baseline for patient, CMP shows slight hyponatremia 132, hypoalbuminemia 3 appears to be a baseline for patient, elevated alk phos appears at baseline for patient, T bili 4.6 above baseline of 2.  Ammonia is 57 appears to be a baseline for patient.  CT had negative for acute findings.  Rule out- Low suspicion for CVA or intracranial head bleed as there is no neuro deficit on my exam, head CT negative for acute findings.  Low suspicion for spinal fracture or spinal cord abnormality as patient is moving all 4 extremities at difficulty, spine was palpated nontender to location, no step-off or deformities present.  Low suspicion for rib fracture or pneumothorax  as patient's ribs are nontender to palpation, lung sounds are clear bilaterally.  Low suspicion for hepatic encephalopathy as patient is alert,  at baseline, ammonia level 57.   Low suspicion for intra-abdominal bleeding as there is no peritoneal sign noted my exam, nontender left upper quadrant for splenic rupture. Patient abdomen is distended and tender to palpation but I suspect this is secondary due to cirrhosis.  Low suspicion for SBP as fluid coming from her left lower quadrant is clear, there is no leukocytosis, patient is nontoxic-appearing, vital signs reassuring.  Suspect drainage is possible track from previous paracentesis.  Plan-due to shift change patient will be handed off  to Dr. Roxanne Mins, he is provided HPI, current work-up, likely disposition.  Anticipate patient will be discharged home as long as her imaging comes back unremarkable.  Will recommend patient wound care and follow-up with PCP for reevaluation 1 week's time.  Final Clinical Impression(s) / ED Diagnoses Final diagnoses:  Fall, initial encounter  Skin tear of left forearm without complication, initial encounter  Skin tear of left lower leg without complication, initial encounter    Rx / DC Orders ED Discharge Orders    None       Aron Baba 12/23/20 2318    Milton Ferguson, MD 12/24/20 1155

## 2020-12-23 NOTE — ED Provider Notes (Signed)
Care assumed from Deno Etienne, PA-C, patient with fall and negative work-up but having some abdominal tenderness and pending results of CT of abdomen.  CT scan appears to show significant ascites and large stool burden with radiologist interpretation pending.  CT report indicates no acute intra-abdominal process, patient is safe for discharge.  Results for orders placed or performed during the hospital encounter of 12/23/20  CBC with Differential  Result Value Ref Range   WBC 6.4 4.0 - 10.5 K/uL   RBC 3.01 (L) 3.87 - 5.11 MIL/uL   Hemoglobin 8.3 (L) 12.0 - 15.0 g/dL   HCT 27.8 (L) 36.0 - 46.0 %   MCV 92.4 80.0 - 100.0 fL   MCH 27.6 26.0 - 34.0 pg   MCHC 29.9 (L) 30.0 - 36.0 g/dL   RDW 21.6 (H) 11.5 - 15.5 %   Platelets 39 (L) 150 - 400 K/uL   nRBC 0.0 0.0 - 0.2 %   Neutrophils Relative % 74 %   Neutro Abs 4.8 1.7 - 7.7 K/uL   Lymphocytes Relative 8 %   Lymphs Abs 0.5 (L) 0.7 - 4.0 K/uL   Monocytes Relative 16 %   Monocytes Absolute 1.0 0.1 - 1.0 K/uL   Eosinophils Relative 0 %   Eosinophils Absolute 0.0 0.0 - 0.5 K/uL   Basophils Relative 1 %   Basophils Absolute 0.0 0.0 - 0.1 K/uL   Immature Granulocytes 1 %   Abs Immature Granulocytes 0.08 (H) 0.00 - 0.07 K/uL  Comprehensive metabolic panel  Result Value Ref Range   Sodium 132 (L) 135 - 145 mmol/L   Potassium 4.1 3.5 - 5.1 mmol/L   Chloride 103 98 - 111 mmol/L   CO2 22 22 - 32 mmol/L   Glucose, Bld 97 70 - 99 mg/dL   BUN 14 8 - 23 mg/dL   Creatinine, Ser 0.51 0.44 - 1.00 mg/dL   Calcium 8.2 (L) 8.9 - 10.3 mg/dL   Total Protein 5.6 (L) 6.5 - 8.1 g/dL   Albumin 3.0 (L) 3.5 - 5.0 g/dL   AST 38 15 - 41 U/L   ALT 19 0 - 44 U/L   Alkaline Phosphatase 131 (H) 38 - 126 U/L   Total Bilirubin 4.6 (H) 0.3 - 1.2 mg/dL   GFR, Estimated >60 >60 mL/min   Anion gap 7 5 - 15  Ammonia  Result Value Ref Range   Ammonia 57 (H) 9 - 35 umol/L   CT Head Wo Contrast  Result Date: 12/23/2020 CLINICAL DATA:  Status post fall. EXAM:  CT HEAD WITHOUT CONTRAST TECHNIQUE: Contiguous axial images were obtained from the base of the skull through the vertex without intravenous contrast. COMPARISON:  August 13, 2020 FINDINGS: Brain: There is mild cerebral atrophy with widening of the extra-axial spaces and ventricular dilatation. There are areas of decreased attenuation within the white matter tracts of the supratentorial brain, consistent with microvascular disease changes. Vascular: No hyperdense vessel or unexpected calcification. Skull: Normal. Negative for fracture or focal lesion. Sinuses/Orbits: No acute finding. Other: None. IMPRESSION: 1. Mild cerebral atrophy. 2. No acute intracranial abnormality. Electronically Signed   By: Virgina Norfolk M.D.   On: 12/23/2020 23:05   CT Cervical Spine Wo Contrast  Result Date: 12/23/2020 CLINICAL DATA:  Status post fall. EXAM: CT CERVICAL SPINE WITHOUT CONTRAST TECHNIQUE: Multidetector CT imaging of the cervical spine was performed without intravenous contrast. Multiplanar CT image reconstructions were also generated. COMPARISON:  July 07, 2020 FINDINGS: Alignment: Approximately 1 mm to 2 mm  anterolisthesis of the C4 vertebral body is noted on C5. Approximately 2 mm retrolisthesis of the C5 vertebral body is noted on C6. Skull base and vertebrae: No acute fracture. No primary bone lesion or focal pathologic process. Soft tissues and spinal canal: No prevertebral fluid or swelling. No visible canal hematoma. Disc levels: Moderate severity endplate sclerosis is seen at the levels of C4-C5, C5-C6 and C6-C7. Mild intervertebral disc space narrowing is present at the levels of C2-C3 and C3-C4. Marked severity intervertebral disc space narrowing is seen at the levels of C4-C5, C5-C6 and C6-C7. Bilateral moderate severity multilevel facet joint hypertrophy is noted. Upper chest: Negative. Other: None. IMPRESSION: 1. Marked severity multilevel degenerative changes, as described above. 2. Approximately 1  mm to 2 mm anterolisthesis of the C4 vertebral body on C5. 3. Approximately 2 mm retrolisthesis of the C5 vertebral body on C6. 4. No acute cervical spine fracture. Electronically Signed   By: Virgina Norfolk M.D.   On: 12/23/2020 23:08   CT ABDOMEN PELVIS W CONTRAST  Result Date: 12/23/2020 CLINICAL DATA:  Abdominal distension EXAM: CT ABDOMEN AND PELVIS WITH CONTRAST TECHNIQUE: Multidetector CT imaging of the abdomen and pelvis was performed using the standard protocol following bolus administration of intravenous contrast. CONTRAST:  30m OMNIPAQUE IOHEXOL 300 MG/ML  SOLN COMPARISON:  11/02/2020 CT, 09/03/2020, 08/15/2020 FINDINGS: Lower chest: Lung bases demonstrate partially visualized bilateral breast implants. Loculated fluid collection along the right lateral chest measures 8 x 2.7 cm and is unchanged in size. Small left-sided pleural effusion with dependent atelectasis. Hepatobiliary: Cirrhosis of the liver. Multiple calcified gallstones. No biliary dilatation Pancreas: Unremarkable. No pancreatic ductal dilatation or surrounding inflammatory changes. Spleen: Enlarged, measuring 16 cm craniocaudad. Adrenals/Urinary Tract: Adrenal glands are normal. Kidneys show minimal right hydronephrosis with stable 14 mm right pelvic stone. Subcentimeter hypodensity within the mid left kidney too small to further characterize. The bladder is unremarkable Stomach/Bowel: The stomach is nonenlarged. No dilated small bowel. Worsened diffuse colon wall thickening, most evident involving the right colon, transverse colon and descending colon. Vascular/Lymphatic: Nonaneurysmal aorta. Subcentimeter retroperitoneal nodes. Small gastroesophageal and perirectal varices. Recanalized paraumbilical vein. Reproductive: Uterus unremarkable. Stable 2.1 cm right adnexal cyst and 1.3 cm left adnexal cyst. Other: No free air. Moderate to large volume of ascites within the abdomen and pelvis. Generalized body wall edema with  subcutaneous fluid. Musculoskeletal: Chronic compression deformity of L5 and at the superior endplates of TH47and L1. Chronic fracture deformity of the sacrum. Old fracture deformity of the left iliac bone. Chronic fracture deformity of left inferior pubic ramus. Acute to subacute fracture deformity of left inferior pubic ramus slightly more posterior to the chronic deformity. IMPRESSION: 1. 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 Moderate to large volume of ascites within the abdomen and pelvis. Similar small left-sided pleural effusion. Generalized subcutaneous fluid and edema consistent with anasarca. 2. Slight worsening of diffuse colon wall thickening which could be due to portal colopathy although colitis could also produce this appearance. 3. Multiple chronic fracture deformities. There is an acute to subacute appearing fracture involving the left inferior pubic ramus posterior to a chronic fracture deformity. 4. Multiple gallstones 5. Stable right renal pelvis calcification with minimal right hydronephrosis. 6. Stable bilateral ovarian cysts measuring up to 2.1 cm. No follow-up imaging recommended. Note: This recommendation does not apply to premenarchal patients and to those with increased risk (genetic, family history, elevated tumor markers or other high-risk factors) of ovarian cancer. Reference: JACR 2020 Feb; 17(2):248-254 Electronically  Signed   By: Donavan Foil M.D.   On: 12/23/2020 23:14   XR HIP UNILAT W OR W/O PELVIS 2-3 VIEWS LEFT  Result Date: 12/04/2020 AP pelvis lateral left hip obtained demonstrating a prior hip nailing in good position.  No evidence of a hip fracture.  There are some fracture line visible about the greater trochanter but they look old.  There is also a prior superior and inferior pubic rami fracture on the left.  Compared to films performed previously there did not appear to be any change.  There was some bulky changes about the iliac crest on the  left but are similar to the prior films.  I do not see any acute changes on today's films  XR Ankle 2 Views Left  Result Date: 12/04/2020 Films of the right ankle obtained in 2 projections no acute changes.  Diffuse osteopenia.  Prior os calcis fracture appears to have healed with some collapse but there is no pain.  No soft tissue swelling  IR Paracentesis  Result Date: 12/18/2020 INDICATION: Recurrent ascites EXAM: ULTRASOUND GUIDED therapeutic PARACENTESIS MEDICATIONS: 10 mL 1% lidocaine COMPLICATIONS: None immediate. PROCEDURE: Informed written consent was obtained from the patient after a discussion of the risks, benefits and alternatives to treatment. A timeout was performed prior to the initiation of the procedure. Initial ultrasound scanning demonstrates a large amount of ascites within the left lower abdominal quadrant. The left lower abdomen was prepped and draped in the usual sterile fashion. 1% lidocaine was used for local anesthesia. Following this, a 19 gauge, 7-cm, Yueh catheter was introduced. An ultrasound image was saved for documentation purposes. The paracentesis was performed. The catheter was removed and a dressing was applied. The patient tolerated the procedure well without immediate post procedural complication. Patient received post-procedure intravenous albumin; see nursing notes for details. FINDINGS: A total of approximately 6.2 L of clear yellow fluid was removed. Samples were not sent to the laboratory as requested by the clinical team. IMPRESSION: Successful ultrasound-guided paracentesis yielding 6.2 liters of peritoneal fluid. Read by: Durenda Guthrie, PA-C Electronically Signed   By: Jacqulynn Cadet M.D.   On: 12/18/2020 11:23   IR Paracentesis  Result Date: 12/12/2020 INDICATION: Patient with history of NASH cirrhosis, recurrent ascites; request received for therapeutic paracentesis up to 10 liters. EXAM: ULTRASOUND GUIDED THERAPEUTIC  PARACENTESIS MEDICATIONS: 1% lidocaine  to skin/ subcutaneous tissue COMPLICATIONS: None immediate. PROCEDURE: Informed written consent was obtained from the patient after a discussion of the risks, benefits and alternatives to treatment. A timeout was performed prior to the initiation of the procedure. Initial ultrasound scanning demonstrates a large amount of ascites within the left mid to lower abdominal quadrant. The left mid to lower abdomen was prepped and draped in the usual sterile fashion. 1% lidocaine was used for local anesthesia. Following this, a 19 gauge, 7-cm, Yueh catheter was introduced. An ultrasound image was saved for documentation purposes. The paracentesis was performed. The catheter was removed and a dressing was applied. The patient tolerated the procedure well without immediate post procedural complication. Patient received post-procedure intravenous albumin; see nursing notes for details. FINDINGS: A total of approximately 5.2 liters of yellow fluid was removed. IMPRESSION: Successful ultrasound-guided therapeutic paracentesis yielding 5.2 liters of peritoneal fluid. Read by: Nash Mantis Electronically Signed   By: Corrie Mckusick D.O.   On: 12/12/2020 13:56   IR Paracentesis  Result Date: 12/05/2020 INDICATION: Recurring ascites due to decompensated cirrhosis, IR therapeutic paracentesis EXAM: IR ULTRASOUND GUIDED therapeutic PARACENTESIS MEDICATIONS:  N 10 mL 1% lidocaine one. COMPLICATIONS: None immediate. PROCEDURE: Informed written consent was obtained from the patient after a discussion of the risks, benefits and alternatives to treatment. A timeout was performed prior to the initiation of the procedure. Initial ultrasound scanning demonstrates a large amount of ascites within the left lower abdominal quadrant. The left lower abdomen was prepped and draped in the usual sterile fashion. 1% lidocaine was used for local anesthesia. Following this, a 19 gauge, 7-cm, Yueh catheter was introduced. An ultrasound image was  saved for documentation purposes. The paracentesis was performed. The catheter was removed and a dressing was applied. The patient tolerated the procedure well without immediate post procedural complication. Patient received post-procedure intravenous albumin; see nursing notes for details. FINDINGS: A total of approximately 8 liters of clear yellow fluid was removed. IMPRESSION: Successful ultrasound-guided paracentesis yielding 8 liters of peritoneal fluid. Read by: Durenda Guthrie, PA-C Electronically Signed   By: Jacqulynn Cadet M.D.   On: 12/05/2020 14:20   IR Paracentesis  Result Date: 11/27/2020 INDICATION: Patient with history NASH cirrhosis, recurrent ascites; request received for therapeutic paracentesis up to 10 liters. EXAM: ULTRASOUND GUIDED THERAPEUTIC PARACENTESIS MEDICATIONS: 1% lidocaine to skin and subcutaneous tissue COMPLICATIONS: None immediate. PROCEDURE: Informed written consent was obtained from the patient after a discussion of the risks, benefits and alternatives to treatment. A timeout was performed prior to the initiation of the procedure. Initial ultrasound scanning demonstrates a large amount of ascites within the left mid to lower abdominal quadrant. The left mid to lower abdomen was prepped and draped in the usual sterile fashion. 1% lidocaine was used for local anesthesia. Following this, a 19 gauge, 10-cm, Yueh catheter was introduced. An ultrasound image was saved for documentation purposes. The paracentesis was performed. The catheter was removed and a dressing was applied. The patient tolerated the procedure well without immediate post procedural complication. Patient received post-procedure intravenous albumin; see nursing notes for details. FINDINGS: A total of approximately 8.3 liters of yellow fluid was removed. Read by: Rowe Robert, PA-C IMPRESSION: Successful ultrasound-guided therapeutic paracentesis yielding 8.3 liters of peritoneal fluid. Read by: Rowe Robert, PA-C  Electronically Signed   By: Jacqulynn Cadet M.D.   On: 11/27/2020 14:29  Images viewed by me.    Delora Fuel, MD 34/28/76 [phone]

## 2020-12-23 NOTE — Telephone Encounter (Signed)
Please advise, thanks.

## 2020-12-23 NOTE — Telephone Encounter (Signed)
Called and LM for patient to see if she could have her next EGD at [facility] on Thursday, 4-21 with Covid testing on Monday 4-18. She has an upcoming appointment on 3-29 and could be instructed at that time. Asked patient to call me back to confirm availability for that date.

## 2020-12-23 NOTE — ED Triage Notes (Addendum)
Pt brought in by [facility] from home with c/o fall. Pt has skin tears to left arm and left. Pt also c/o abdominal weeping since her last paracentesis last Monday. Denies hitting her head upon fall, LOC and usage of blood thinner. Pt reports she lost her balance causing her to fall today.

## 2020-12-23 NOTE — Telephone Encounter (Signed)
Patient calling back about letter to her insurance for mattress. She states she spoke to her insurance and she does qualify for this but they require letter from her provider. Please call patient to advise.

## 2020-12-23 NOTE — Telephone Encounter (Signed)
Patient cancelled her lab appt for today because she is not feeling well. She wonders if Dr. Anitra Lauth can use the same labs (BMP) Dr. Havery Moros took 11/29/20 instead of drawing again.

## 2020-12-24 ENCOUNTER — Telehealth: Payer: Self-pay

## 2020-12-24 NOTE — ED Notes (Signed)
Assisted patient to the bathroom.

## 2020-12-24 NOTE — Telephone Encounter (Signed)
Patient was seen on ED yesterday. She fell and has injury to her her arm.  She had a paracentesis done last week and since then has had worsening pain with leaking of clear fluids. ED told her to follow up with her PCP.  Patient can be reached at [phone]

## 2020-12-24 NOTE — Telephone Encounter (Signed)
Please assist patient with scheduling, thanks. Appt on 3/3 can be changed to ED follow up and [facility] f/u rescheduled to a later date.

## 2020-12-24 NOTE — ED Notes (Signed)
Dressing changed to abd due to leaking.

## 2020-12-24 NOTE — ED Notes (Signed)
Attempted to call patient spouse no answer at this time again.

## 2020-12-24 NOTE — ED Notes (Signed)
Multiple attempts to call husband and sister for d/c home since after 0000. Attempted by patient multiple times. Voice mails left.

## 2020-12-24 NOTE — ED Notes (Signed)
Lattie Haw will be here at 915 to pick up pt

## 2020-12-25 NOTE — Telephone Encounter (Signed)
Pt seen in ED on 2/21. Mentioned in plan/note: "Plan-due to shift change patient will be handed off to Dr. Roxanne Mins, he is provided HPI, current work-up, likely disposition.  Anticipate patient will be discharged home as long as her imaging comes back unremarkable.  Will recommend patient wound care and follow-up with PCP for reevaluation 1 week's time"  Please advise, thanks.

## 2020-12-25 NOTE — Telephone Encounter (Signed)
Please see message below regarding letter for mattress for insurance.

## 2020-12-25 NOTE — Telephone Encounter (Signed)
Pt's first paracentesis procedure with interventional radiology was 1/19 and continued and were completed on the following dates: 1/26, 2/3, 2/10, 2/16. Her next scheduled appt is tomorrow, 2/24.

## 2020-12-25 NOTE — Telephone Encounter (Signed)
Pt said interventional radiology handles

## 2020-12-25 NOTE — Telephone Encounter (Signed)
Patient called to follow up on status of mattress request   Please call [phone]

## 2020-12-25 NOTE — Telephone Encounter (Signed)
Any known date of paracentesis procedures that she has had with interventional radiology? She seems to have chronic leak from paracentesis site on her abdomen and that is an issue that should be addressed through them.

## 2020-12-25 NOTE — Telephone Encounter (Signed)
Offer 4:15 virtual visit for today

## 2020-12-25 NOTE — Telephone Encounter (Signed)
What was the date of her most recent paracentesis. Ask patient who does this for her, interventional radiology or GI? I can't find any encounters in EMR for this recently for her.

## 2020-12-25 NOTE — Telephone Encounter (Signed)
LM for patient to call back or use MyChart to let me know if she is available for EGD on 4-21 at [facility]

## 2020-12-25 NOTE — Telephone Encounter (Signed)
Pt declined 4:15 VV

## 2020-12-25 NOTE — Telephone Encounter (Signed)
Spoke to patient changed 3/3 appt to ED follow up.  She asked what does she do between today - appt on 3/3 for leaking and pain.  Please call patient at [phone]

## 2020-12-26 ENCOUNTER — Ambulatory Visit (HOSPITAL_COMMUNITY): Admission: RE | Admit: 2020-12-26 | Payer: Medicare HMO | Source: Ambulatory Visit

## 2020-12-27 ENCOUNTER — Other Ambulatory Visit: Payer: Self-pay | Admitting: Family Medicine

## 2020-12-27 ENCOUNTER — Telehealth: Payer: Self-pay

## 2020-12-27 DIAGNOSIS — Z79899 Other long term (current) drug therapy: Secondary | ICD-10-CM

## 2020-12-27 DIAGNOSIS — K7460 Unspecified cirrhosis of liver: Secondary | ICD-10-CM

## 2020-12-27 NOTE — Telephone Encounter (Signed)
Pt asked regarding discount letter for mattress again. Unable to find anything approving this.   Please advise, thanks.

## 2020-12-27 NOTE — Telephone Encounter (Signed)
Spoke with patient and advised lab visit needed. Appt scheduled for 2/28. 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

## 2020-12-27 NOTE — Telephone Encounter (Signed)
Pls call pt and tell her I need her to come in for lab visit for urine and blood drug screen b/c an unexpected abnormality came up on her last urine drug screen done on 12/09/20.  No further controlled substances can be given pending the results of this follow up screening. Orders will be entered--thx.

## 2020-12-27 NOTE — Telephone Encounter (Signed)
Lm on vm for patient to return call 

## 2020-12-27 NOTE — Telephone Encounter (Signed)
-----   Message from Yevette Edwards, RN sent at 12/20/2020  9:19 AM EST ----- Regarding: Labs Repeat BMET, order in epic.

## 2020-12-27 NOTE — Telephone Encounter (Signed)
LM for pt to return call regarding letter. No approval given for letter mentioned by PCP. Will have to wait until follow up appt on 3/3

## 2020-12-29 ENCOUNTER — Other Ambulatory Visit: Payer: Self-pay

## 2020-12-29 ENCOUNTER — Encounter (HOSPITAL_COMMUNITY): Payer: Self-pay | Admitting: *Deleted

## 2020-12-29 ENCOUNTER — Inpatient Hospital Stay (HOSPITAL_COMMUNITY)
Admission: EM | Admit: 2020-12-29 | Discharge: 2021-01-03 | DRG: 441 | Disposition: A | Discharge status: Home Health | Payer: Medicare HMO | Attending: Internal Medicine | Admitting: Internal Medicine

## 2020-12-29 DIAGNOSIS — Z880 Allergy status to penicillin: Secondary | ICD-10-CM

## 2020-12-29 DIAGNOSIS — K7581 Nonalcoholic steatohepatitis (NASH): Secondary | ICD-10-CM | POA: Diagnosis present

## 2020-12-29 DIAGNOSIS — R011 Cardiac murmur, unspecified: Secondary | ICD-10-CM | POA: Diagnosis present

## 2020-12-29 DIAGNOSIS — M19041 Primary osteoarthritis, right hand: Secondary | ICD-10-CM | POA: Diagnosis present

## 2020-12-29 DIAGNOSIS — K5909 Other constipation: Secondary | ICD-10-CM | POA: Diagnosis present

## 2020-12-29 DIAGNOSIS — R4182 Altered mental status, unspecified: Secondary | ICD-10-CM | POA: Diagnosis present

## 2020-12-29 DIAGNOSIS — K7290 Hepatic failure, unspecified without coma: Secondary | ICD-10-CM

## 2020-12-29 DIAGNOSIS — Z885 Allergy status to narcotic agent status: Secondary | ICD-10-CM

## 2020-12-29 DIAGNOSIS — F32A Depression, unspecified: Secondary | ICD-10-CM | POA: Diagnosis present

## 2020-12-29 DIAGNOSIS — Z9114 Patient's other noncompliance with medication regimen: Secondary | ICD-10-CM

## 2020-12-29 DIAGNOSIS — F419 Anxiety disorder, unspecified: Secondary | ICD-10-CM | POA: Diagnosis present

## 2020-12-29 DIAGNOSIS — D509 Iron deficiency anemia, unspecified: Secondary | ICD-10-CM | POA: Diagnosis present

## 2020-12-29 DIAGNOSIS — X58XXXA Exposure to other specified factors, initial encounter: Secondary | ICD-10-CM | POA: Diagnosis present

## 2020-12-29 DIAGNOSIS — I10 Essential (primary) hypertension: Secondary | ICD-10-CM | POA: Diagnosis present

## 2020-12-29 DIAGNOSIS — Z882 Allergy status to sulfonamides status: Secondary | ICD-10-CM

## 2020-12-29 DIAGNOSIS — Z79890 Hormone replacement therapy (postmenopausal): Secondary | ICD-10-CM

## 2020-12-29 DIAGNOSIS — G8929 Other chronic pain: Secondary | ICD-10-CM | POA: Diagnosis present

## 2020-12-29 DIAGNOSIS — U071 COVID-19: Secondary | ICD-10-CM | POA: Diagnosis present

## 2020-12-29 DIAGNOSIS — R0602 Shortness of breath: Secondary | ICD-10-CM

## 2020-12-29 DIAGNOSIS — D6959 Other secondary thrombocytopenia: Secondary | ICD-10-CM | POA: Diagnosis present

## 2020-12-29 DIAGNOSIS — M170 Bilateral primary osteoarthritis of knee: Secondary | ICD-10-CM | POA: Diagnosis present

## 2020-12-29 DIAGNOSIS — G629 Polyneuropathy, unspecified: Secondary | ICD-10-CM | POA: Diagnosis present

## 2020-12-29 DIAGNOSIS — Z87442 Personal history of urinary calculi: Secondary | ICD-10-CM

## 2020-12-29 DIAGNOSIS — E039 Hypothyroidism, unspecified: Secondary | ICD-10-CM | POA: Diagnosis present

## 2020-12-29 DIAGNOSIS — K7682 Hepatic encephalopathy: Secondary | ICD-10-CM

## 2020-12-29 DIAGNOSIS — N83202 Unspecified ovarian cyst, left side: Secondary | ICD-10-CM | POA: Diagnosis present

## 2020-12-29 DIAGNOSIS — K7200 Acute and subacute hepatic failure without coma: Secondary | ICD-10-CM | POA: Diagnosis not present

## 2020-12-29 DIAGNOSIS — M810 Age-related osteoporosis without current pathological fracture: Secondary | ICD-10-CM | POA: Diagnosis present

## 2020-12-29 DIAGNOSIS — J90 Pleural effusion, not elsewhere classified: Secondary | ICD-10-CM | POA: Diagnosis present

## 2020-12-29 DIAGNOSIS — R296 Repeated falls: Secondary | ICD-10-CM | POA: Diagnosis present

## 2020-12-29 DIAGNOSIS — R188 Other ascites: Secondary | ICD-10-CM

## 2020-12-29 DIAGNOSIS — N83201 Unspecified ovarian cyst, right side: Secondary | ICD-10-CM | POA: Diagnosis present

## 2020-12-29 DIAGNOSIS — D684 Acquired coagulation factor deficiency: Secondary | ICD-10-CM | POA: Diagnosis present

## 2020-12-29 DIAGNOSIS — Z681 Body mass index (BMI) 19 or less, adult: Secondary | ICD-10-CM

## 2020-12-29 DIAGNOSIS — M4856XA Collapsed vertebra, not elsewhere classified, lumbar region, initial encounter for fracture: Secondary | ICD-10-CM | POA: Diagnosis present

## 2020-12-29 DIAGNOSIS — M4854XA Collapsed vertebra, not elsewhere classified, thoracic region, initial encounter for fracture: Secondary | ICD-10-CM | POA: Diagnosis present

## 2020-12-29 DIAGNOSIS — E43 Unspecified severe protein-calorie malnutrition: Secondary | ICD-10-CM | POA: Diagnosis present

## 2020-12-29 DIAGNOSIS — E872 Acidosis: Secondary | ICD-10-CM | POA: Diagnosis present

## 2020-12-29 DIAGNOSIS — Z79899 Other long term (current) drug therapy: Secondary | ICD-10-CM

## 2020-12-29 DIAGNOSIS — R64 Cachexia: Secondary | ICD-10-CM | POA: Diagnosis present

## 2020-12-29 DIAGNOSIS — K7460 Unspecified cirrhosis of liver: Secondary | ICD-10-CM | POA: Diagnosis present

## 2020-12-29 DIAGNOSIS — M19042 Primary osteoarthritis, left hand: Secondary | ICD-10-CM | POA: Diagnosis present

## 2020-12-29 DIAGNOSIS — S32592A Other specified fracture of left pubis, initial encounter for closed fracture: Secondary | ICD-10-CM | POA: Diagnosis present

## 2020-12-29 DIAGNOSIS — Z8701 Personal history of pneumonia (recurrent): Secondary | ICD-10-CM

## 2020-12-29 DIAGNOSIS — I8510 Secondary esophageal varices without bleeding: Secondary | ICD-10-CM | POA: Diagnosis present

## 2020-12-29 DIAGNOSIS — K7210 Chronic hepatic failure without coma: Secondary | ICD-10-CM | POA: Diagnosis present

## 2020-12-29 DIAGNOSIS — Z886 Allergy status to analgesic agent status: Secondary | ICD-10-CM

## 2020-12-29 LAB — GRAM STAIN

## 2020-12-29 LAB — CBC
HCT: 29.9 % — ABNORMAL LOW (ref 36.0–46.0)
Hemoglobin: 9 g/dL — ABNORMAL LOW (ref 12.0–15.0)
MCH: 27.4 pg (ref 26.0–34.0)
MCHC: 30.1 g/dL (ref 30.0–36.0)
MCV: 91.2 fL (ref 80.0–100.0)
Platelets: 47 10*3/uL — ABNORMAL LOW (ref 150–400)
RBC: 3.28 MIL/uL — ABNORMAL LOW (ref 3.87–5.11)
RDW: 22.1 % — ABNORMAL HIGH (ref 11.5–15.5)
WBC: 6.6 10*3/uL (ref 4.0–10.5)
nRBC: 0 % (ref 0.0–0.2)

## 2020-12-29 LAB — RESP PANEL BY RT-PCR (FLU A&B, COVID) ARPGX2
Influenza A by PCR: NEGATIVE
Influenza B by PCR: NEGATIVE
SARS Coronavirus 2 by RT PCR: POSITIVE — AB

## 2020-12-29 LAB — ALBUMIN, PLEURAL OR PERITONEAL FLUID: Albumin, Fluid: <1 g/dL

## 2020-12-29 LAB — PROTIME-INR
INR: 1.6 — ABNORMAL HIGH (ref 0.8–1.2)
Prothrombin Time: 18 seconds — ABNORMAL HIGH (ref 11.4–15.2)

## 2020-12-29 LAB — BODY FLUID CELL COUNT WITH DIFFERENTIAL
Lymphs, Fluid: 26 %
Monocyte-Macrophage-Serous Fluid: 49 % — ABNORMAL LOW (ref 50–90)
Neutrophil Count, Fluid: 25 % (ref 0–25)
Total Nucleated Cell Count, Fluid: 32 cu mm (ref 0–1000)

## 2020-12-29 LAB — COMPREHENSIVE METABOLIC PANEL
ALT: 22 U/L (ref 0–44)
AST: 44 U/L — ABNORMAL HIGH (ref 15–41)
Albumin: 2.9 g/dL — ABNORMAL LOW (ref 3.5–5.0)
Alkaline Phosphatase: 131 U/L — ABNORMAL HIGH (ref 38–126)
Anion gap: 14 (ref 5–15)
BUN: 11 mg/dL (ref 8–23)
CO2: 18 mmol/L — ABNORMAL LOW (ref 22–32)
Calcium: 8.6 mg/dL — ABNORMAL LOW (ref 8.9–10.3)
Chloride: 103 mmol/L (ref 98–111)
Creatinine, Ser: 0.73 mg/dL (ref 0.44–1.00)
GFR, Estimated: >60 mL/min (ref >60)
Glucose, Bld: 88 mg/dL (ref 70–99)
Potassium: 4.3 mmol/L (ref 3.5–5.1)
Sodium: 135 mmol/L (ref 135–145)
Total Bilirubin: 4.3 mg/dL — ABNORMAL HIGH (ref 0.3–1.2)
Total Protein: 6 g/dL — ABNORMAL LOW (ref 6.5–8.1)

## 2020-12-29 LAB — LACTATE DEHYDROGENASE, PLEURAL OR PERITONEAL FLUID: LD, Fluid: 15 U/L (ref 3–23)

## 2020-12-29 LAB — AMMONIA: Ammonia: 75 umol/L — ABNORMAL HIGH (ref 9–35)

## 2020-12-29 LAB — GLUCOSE, PLEURAL OR PERITONEAL FLUID: Glucose, Fluid: 96 mg/dL

## 2020-12-29 LAB — PROTEIN, PLEURAL OR PERITONEAL FLUID: Total protein, fluid: <3 g/dL

## 2020-12-29 LAB — CBG MONITORING, ED: Glucose-Capillary: 83 mg/dL (ref 70–99)

## 2020-12-29 MED ORDER — LACTULOSE 10 GM/15ML PO SOLN
30.0000 g | Freq: Once | ORAL | Status: AC
  Filled 2020-12-29: qty 45

## 2020-12-29 MED ORDER — SERTRALINE HCL 50 MG PO TABS: 50.0000 mg | ORAL_TABLET | Freq: Every day | ORAL | Status: DC

## 2020-12-29 MED ORDER — PANTOPRAZOLE SODIUM 40 MG PO TBEC
40.0000 mg | DELAYED_RELEASE_TABLET | Freq: Every day | ORAL | Status: DC
  Administered 2020-12-29: 40 mg via ORAL
  Filled 2020-12-29: qty 1

## 2020-12-29 MED ORDER — FUROSEMIDE 40 MG PO TABS
40.0000 mg | ORAL_TABLET | Freq: Every day | ORAL | Status: DC
  Administered 2020-12-29 – 2021-01-03 (×6): 40 mg via ORAL
  Filled 2020-12-29 (×6): qty 1

## 2020-12-29 MED ORDER — ALBUMIN HUMAN 25 % IV SOLN
25.0000 g | Freq: Four times a day (QID) | INTRAVENOUS | Status: DC
  Administered 2020-12-30: 12.5 g via INTRAVENOUS
  Administered 2020-12-30: 25 g via INTRAVENOUS
  Filled 2020-12-29 (×4): qty 100

## 2020-12-29 MED ORDER — ENSURE ENLIVE PO LIQD
237.0000 mL | Freq: Two times a day (BID) | ORAL | Status: DC
  Administered 2020-12-30 – 2021-01-02 (×4): 237 mL via ORAL
  Filled 2020-12-29: qty 237

## 2020-12-29 MED ORDER — RIFAXIMIN 550 MG PO TABS
550.0000 mg | ORAL_TABLET | Freq: Two times a day (BID) | ORAL | Status: DC
  Administered 2020-12-29 – 2021-01-03 (×10): 550 mg via ORAL
  Filled 2020-12-29 (×10): qty 1

## 2020-12-29 MED ORDER — OXYCODONE HCL 5 MG PO TABS: 10.0000 mg | ORAL_TABLET | Freq: Three times a day (TID) | ORAL | Status: DC | PRN

## 2020-12-29 MED ORDER — LEVOTHYROXINE SODIUM 75 MCG PO TABS
75.0000 ug | ORAL_TABLET | Freq: Every day | ORAL | Status: DC
  Administered 2020-12-31 – 2021-01-03 (×4): 75 ug via ORAL
  Filled 2020-12-29 (×4): qty 1

## 2020-12-29 MED ORDER — LACTULOSE 10 GM/15ML PO SOLN
30.0000 g | Freq: Two times a day (BID) | ORAL | Status: DC
  Administered 2020-12-29: 30 g via ORAL
  Filled 2020-12-29: qty 45

## 2020-12-29 MED ORDER — SPIRONOLACTONE 100 MG PO TABS
200.0000 mg | ORAL_TABLET | Freq: Every day | ORAL | Status: DC
  Administered 2020-12-29 – 2021-01-03 (×6): 200 mg via ORAL
  Filled 2020-12-29 (×3): qty 2
  Filled 2020-12-29 (×3): qty 8
  Filled 2020-12-29 (×2): qty 2
  Filled 2020-12-29: qty 8
  Filled 2020-12-29: qty 2
  Filled 2020-12-29: qty 8
  Filled 2020-12-29: qty 2

## 2020-12-29 MED ORDER — FERROUS SULFATE 325 (65 FE) MG PO TABS
325.0000 mg | ORAL_TABLET | Freq: Every day | ORAL | Status: DC
  Administered 2020-12-30 – 2021-01-03 (×5): 325 mg via ORAL
  Filled 2020-12-29 (×5): qty 1

## 2020-12-29 MED ORDER — SODIUM CHLORIDE 0.9 % IV BOLUS
1000.0000 mL | Freq: Once | INTRAVENOUS | Status: AC
  Administered 2020-12-29: 1000 mL via INTRAVENOUS

## 2020-12-29 MED ORDER — DICLOFENAC SODIUM 1 % EX GEL
2.0000 g | Freq: Every day | CUTANEOUS | Status: DC | PRN
  Administered 2020-12-30: 21:00:00 2 g via TOPICAL
  Filled 2020-12-29: qty 100

## 2020-12-29 MED ORDER — LIDOCAINE-EPINEPHRINE 1 %-1:100000 IJ SOLN
10.0000 mL | Freq: Once | INTRAMUSCULAR | Status: AC
  Administered 2020-12-29: 10 mL via INTRADERMAL
  Filled 2020-12-29: qty 1

## 2020-12-29 MED ORDER — POTASSIUM CHLORIDE CRYS ER 20 MEQ PO TBCR
40.0000 meq | EXTENDED_RELEASE_TABLET | Freq: Every day | ORAL | Status: DC
  Administered 2020-12-29 – 2021-01-03 (×6): 40 meq via ORAL
  Filled 2020-12-29 (×6): qty 2

## 2020-12-29 MED ORDER — ADULT MULTIVITAMIN W/MINERALS CH
1.0000 | ORAL_TABLET | Freq: Every day | ORAL | Status: DC
  Administered 2020-12-29 – 2021-01-03 (×6): 1 via ORAL
  Filled 2020-12-29 (×6): qty 1

## 2020-12-29 MED ORDER — SERTRALINE HCL 50 MG PO TABS
150.0000 mg | ORAL_TABLET | Freq: Every day | ORAL | Status: DC
  Administered 2020-12-29 – 2021-01-03 (×6): 150 mg via ORAL
  Filled 2020-12-29 (×7): qty 3

## 2020-12-29 MED ORDER — PHYTONADIONE 5 MG PO TABS
5.0000 mg | ORAL_TABLET | Freq: Once | ORAL | Status: AC
  Administered 2020-12-29: 5 mg via ORAL
  Filled 2020-12-29: qty 1

## 2020-12-29 NOTE — H&P (Signed)
History and Physical    Megan Roberson ZOX:[id number] DOB: [birth year] DOA: 12/29/2020  PCP: Tammi Sou, MD (Confirm with patient/family/NH records and if not entered, this has to be entered at [facility] point of entry) Patient coming from: Home  I have personally briefly reviewed patient's old medical records in [place]  Chief Complaint: AMS  HPI: Megan Roberson is a 65 y.o. female with medical history significant of NASH with portal hypertension and recurrent ascites requiring weekly paracentesis, history of hepatic encephalopathy on rifaximin and lactulose, but noncompliant with lactulose, chronic abdominal pain on as needed oxycodone, constipation, chronic thrombocytopenia secondary to portal hypertension, chronic iron deficiency anemia, presented with altered mentation.  Patient's been taking rifaximin and lactulose as needed for prevention of encephalopathy, however patient not compliant with lactulose due to " giving bloating problem", and that she has been taking oxycodone as needed for chronic abdominal pain.  This week patient developed constipation for last 4 days, and husband tried to give her 1 dose of lactulose yesterday with no effect.  Overnight, patient became hard to arouse and confused in the morning.  Denies any fever chills.  Complaining about global abdominal pain.  Denies any nausea vomiting or chest pain or shortness of breath.  Patient has not been taking Lasix due to "electrolyte problem", but only takes Aldactone.  And she goes to outpatient IR for paracentesis every week.  ED Course: Abdominal distended, diagnostic paracentesis done in the ED, cytology pending.  No fever no hypotension no tachycardia.  Patient confused.  Ammonium 86.  Review of Systems: As per HPI otherwise 14 point review of systems negative.    Past Medical History:  Diagnosis Date  . Anxiety and depression   . Cholelithiasis without cholecystitis   . Chronic abdominal pain   .  Chronic nausea    + abd pain  . Cirrhosis of liver with ascites (HCC)    Alc cirrhosis; hx of multiple abd paracentesis, +esoph varices (+banding), pancytopenia, hx of hepatic enceph, hx of hydrothorax  . Esophageal candidiasis ([place]) 11/2020   Dr. Noreene Larsson (difluc contraind b/c prolonged QT)  . Heart murmur    Echo 10/06/17 [facility]): Normal LV/RV size, basal septal hypertophy-normal variant, LVEF 65-70%, normal LV/RV function, est RAP 5 mmHg, no sign valvular stenosis or regurg--trace MR/TR/PR  . History of abnormal cervical Pap smear   . History of pneumonia   . Hypothyroidism   . Insomnia   . Nephrolithiasis    right renal pelvis, 13-37m nonobst stone  . Neuropathy    unclear hx  . Osteoarthritis, multiple sites    primarily hands and knees  . Osteoporosis 02/09/2018  . Ovarian cyst    bilat.  Stable on MRI pelvis 11/2019 and CT abd/pelv 09/2020--no f/u imaging indicated.  . [place]   . Prolonged QT interval   . Splenomegaly   . Thrombocytopenia ([place] 12/01/2019   severe, hx of plts down into 30s    Past Surgical History:  Procedure Laterality Date  . AUGMENTATION MAMMAPLASTY Bilateral    20 years ago  . BIOPSY  03/15/2018   gastric.  Procedure: BIOPSY;  Surgeon: AYetta Flock MD;  [facility];  Service: Gastroenterology;;  Gastric  . BIOPSY  11/14/2020   Procedure: BIOPSY;  Surgeon: AYetta Flock MD;  [facility];  Service: Gastroenterology;;  . COLONOSCOPY  2019  . COLONOSCOPY WITH ESOPHAGOGASTRODUODENOSCOPY (EGD) AND ESOPHAGEAL DILATION (ED)    . COLONOSCOPY WITH PROPOFOL N/A 03/15/2018   Redundant  colon, severe looping, very edematous colon c/w portal HTN.  Procedure: COLONOSCOPY WITH PROPOFOL;  Surgeon: Yetta Flock, MD;  Location: [facility];  Service: Gastroenterology;  Laterality: N/A;  . ESOPHAGOGASTRODUODENOSCOPY  11/14/2020   esoph candidiasis, nonbleeding ulcer (path benign), portal hypertensive  gastropathy.  No varices.  . ESOPHAGOGASTRODUODENOSCOPY (EGD) WITH PROPOFOL N/A 03/15/2018   Gastritis, small gastric ulcer. Procedure: ESOPHAGOGASTRODUODENOSCOPY (EGD) WITH PROPOFOL;  Surgeon: Yetta Flock, MD;  Location: [facility];  Service: Gastroenterology;  Laterality: N/A;  . ESOPHAGOGASTRODUODENOSCOPY (EGD) WITH PROPOFOL N/A 11/14/2020   Procedure: ESOPHAGOGASTRODUODENOSCOPY (EGD) WITH PROPOFOL;  Surgeon: Yetta Flock, MD;  Location: [facility];  Service: Gastroenterology;  Laterality: N/A;  . ESOPHAGOGASTRODUODENOSCOPY W/ BANDING     Varices  . FRACTURE SURGERY Left    x 2 L elbow and hip left   . HARDWARE REMOVAL Left 09/17/2017   Procedure: HARDWARE REMOVAL LEFT OLECRANON;  Surgeon: Altamese Otoe, MD;  Location: [place];  Service: Orthopedics;  Laterality: Left;  . IR PARACENTESIS  11/18/2017  . IR PARACENTESIS  11/26/2017  . IR PARACENTESIS  03/14/2018  . IR PARACENTESIS  08/08/2020  . IR PARACENTESIS  09/23/2020  . IR PARACENTESIS  10/22/2020  . IR PARACENTESIS  10/30/2020  . IR PARACENTESIS  11/12/2020  . IR PARACENTESIS  11/20/2020  . IR PARACENTESIS  11/27/2020  . IR PARACENTESIS  12/05/2020  . IR PARACENTESIS  12/12/2020  . IR PARACENTESIS  12/18/2020  . KNEE SURGERY Right    open incision for ligaments  . ORIF ELBOW FRACTURE Left 04/30/2017   Procedure: REVISION OPEN REDUCTION INTERNAL FIXATION (ORIF) ELBOW/OLECRANON FRACTURE;  Surgeon: Altamese Jerome, MD;  Location: [place];  Service: Orthopedics;  Laterality: Left;  . ORIF HIP FRACTURE Left   . REPAIR EXTENSOR TENDON Left 12/29/2019   Procedure: LEFT LONG AND LEFT RING FRINGER EXTENSOR REALIGNAMENT WITH POSSIBLE TENDON TRANSFER;  Surgeon: Charlotte Crumb, MD;  Location: [place];  Service: Orthopedics;  Laterality: Left;  . TRANSTHORACIC ECHOCARDIOGRAM  05/19/2020   extremely poor acoustic window/technically poor study-->normal  . virtual colonoscopy  04/24/2019   no polyps, no mass, no apple core lesion, no  stricture     reports that she has never smoked. She has never used smokeless tobacco. She reports that she does not drink alcohol and does not use drugs.  Allergies  Allergen Reactions  . Penicillins Swelling and Other (See Comments)    FACIAL SWELLING Did it involve swelling of the face/tongue/throat, SOB, or low BP? Yes Did it involve sudden or severe rash/hives, skin peeling, or any reaction on the inside of your mouth or nose? No Did you need to seek medical attention at a hospital or doctor's office? Unknown When did it last happen?30 years If all above answers are "NO", may proceed with cephalosporin use.   . Sulfa Antibiotics Itching and Rash  . Tramadol Swelling and Rash  . Tylenol [Acetaminophen] Nausea Only    Family History  Adopted: Yes  Problem Relation Age of Onset  . Alcohol abuse Daughter      Prior to Admission medications   Medication Sig Start Date End Date Taking? Authorizing Provider  diclofenac Sodium (VOLTAREN) 1 % GEL Apply 2 g topically daily as needed (for pain). 04/23/20  Yes [provider]  feeding supplement (ENSURE ENLIVE / ENSURE PLUS) LIQD Take 237 mLs by mouth 2 (two) times daily between meals. 09/08/20  Yes Hosie Poisson, MD  ferrous sulfate 325 (65 FE) MG EC tablet Take 1 tablet (  325 mg total) by mouth in the morning and at bedtime. 09/17/20  Yes Armbruster, Carlota Raspberry, MD  lactulose (CHRONULAC) 10 GM/15ML solution Take 30 mLs (20 g total) by mouth 3 (three) times daily. Patient taking differently: Take 20 g by mouth 3 (three) times daily as needed for mild constipation. 09/08/20  Yes Hosie Poisson, MD  levothyroxine (SYNTHROID, LEVOTHROID) 75 MCG tablet Take 1 tablet (75 mcg total) by mouth daily before breakfast. 12/02/17  Yes Aline August, MD  Multiple Vitamin (MULTIVITAMIN WITH MINERALS) TABS tablet Take 1 tablet by mouth daily. 09/09/20  Yes Hosie Poisson, MD  omeprazole (PRILOSEC) 40 MG capsule Take 1 capsule (40 mg total) by  mouth 2 (two) times daily for 240 doses. 11/14/20 03/14/21 Yes Armbruster, Carlota Raspberry, MD  ondansetron (ZOFRAN ODT) 4 MG disintegrating tablet Take 1 tablet (4 mg total) by mouth every 8 (eight) hours as needed for nausea or vomiting. 11/20/20  Yes McGowen, Adrian Blackwater, MD  Oxycodone HCl 10 MG TABS 1 tab po tid prn pain Patient taking differently: Take 10 mg by mouth 3 (three) times daily as needed. 1 tab po tid prn pain 12/09/20  Yes McGowen, Adrian Blackwater, MD  potassium chloride SA (KLOR-CON) 20 MEQ tablet Take 2 tablets (40 mEq total) by mouth daily. 09/08/20  Yes Hosie Poisson, MD  sertraline (ZOLOFT) 100 MG tablet Take 100 mg by mouth daily. 08/17/20  Yes [provider]  sertraline (ZOLOFT) 50 MG tablet Take 1 tablet (50 mg total) by mouth daily. 08/21/20  Yes Nita Sells, MD  spironolactone (ALDACTONE) 100 MG tablet Take 2 tablets (200 mg total) by mouth daily. 09/09/20  Yes Hosie Poisson, MD  Suvorexant (BELSOMRA) 10 MG TABS 1 tab po about 30-60 min prior to bedtime nightly 12/09/20  Yes Tammi Sou, MD    Physical Exam: Vitals:   12/29/20 1600 12/29/20 1708 12/29/20 1815 12/29/20 1845  BP: 122/76 132/81 137/87 115/71  Pulse: 78 76 89 76  Resp: 12 15 18 12   Temp:      SpO2: 97% 97% 99% 98%  Weight:      Height:        Constitutional: NAD, calm, comfortable Vitals:   12/29/20 1600 12/29/20 1708 12/29/20 1815 12/29/20 1845  BP: 122/76 132/81 137/87 115/71  Pulse: 78 76 89 76  Resp: 12 15 18 12   Temp:      SpO2: 97% 97% 99% 98%  Weight:      Height:       Eyes: PERRL, lids and conjunctivae normal, conjunctival yellow ENMT: Mucous membranes are moist. Posterior pharynx clear of any exudate or lesions.Normal dentition.  Neck: normal, supple, no masses, no thyromegaly Respiratory: clear to auscultation bilaterally, no wheezing, no crackles. Normal respiratory effort. No accessory muscle use.  Cardiovascular: Regular rate and rhythm, no murmurs / rubs / gallops. No  extremity edema. 2+ pedal pulses. No carotid bruits.  Abdomen: Distended, mild tenderness on all quadrants, no rebound no guarding, positive ascites sign, no masses palpated. No hepatosplenomegaly. Bowel sounds positive.  Musculoskeletal: no clubbing / cyanosis. No joint deformity upper and lower extremities. Good ROM, no contractures. Normal muscle tone.  Skin: no rashes, lesions, ulcers. No induration Neurologic: No facial droops, moving all limbs, following simple commands.  Psychiatric: Oriented to person and place, confused about time   Labs on Admission: I have personally reviewed following labs and imaging studies  CBC: Recent Labs  Lab 12/23/20 2049 12/29/20 1505  WBC 6.4 6.6  NEUTROABS 4.8  --  HGB 8.3* 9.0*  HCT 27.8* 29.9*  MCV 92.4 91.2  PLT 39* 47*   Basic Metabolic Panel: Recent Labs  Lab 12/23/20 2049 12/29/20 1505  NA 132* 135  K 4.1 4.3  CL 103 103  CO2 22 18*  GLUCOSE 97 88  BUN 14 11  CREATININE 0.51 0.73  CALCIUM 8.2* 8.6*   GFR: Estimated Creatinine Clearance: 70.4 mL/min (by C-G formula based on SCr of 0.73 mg/dL). Liver Function Tests: Recent Labs  Lab 12/23/20 2049 12/29/20 1505  AST 38 44*  ALT 19 22  ALKPHOS 131* 131*  BILITOT 4.6* 4.3*  PROT 5.6* 6.0*  ALBUMIN 3.0* 2.9*   No results for input(s): LIPASE, AMYLASE in the last 168 hours. Recent Labs  Lab 12/23/20 2049 12/29/20 1653  AMMONIA 57* 75*   Coagulation Profile: Recent Labs  Lab 12/29/20 1654  INR 1.6*   Cardiac Enzymes: No results for input(s): CKTOTAL, CKMB, CKMBINDEX, TROPONINI in the last 168 hours. BNP (last 3 results) No results for input(s): PROBNP in the last 8760 hours. HbA1C: No results for input(s): HGBA1C in the last 72 hours. CBG: Recent Labs  Lab 12/29/20 1616  GLUCAP 83   Lipid Profile: No results for input(s): CHOL, HDL, LDLCALC, TRIG, CHOLHDL, LDLDIRECT in the last 72 hours. Thyroid Function Tests: No results for input(s): TSH, T4TOTAL,  FREET4, T3FREE, THYROIDAB in the last 72 hours. Anemia Panel: No results for input(s): VITAMINB12, FOLATE, FERRITIN, TIBC, IRON, RETICCTPCT in the last 72 hours. Urine analysis:    Component Value Date/Time   COLORURINE AMBER (A) 11/02/2020 0105   APPEARANCEUR CLEAR 11/02/2020 0105   LABSPEC >1.046 (H) 11/02/2020 0105   PHURINE 5.0 11/02/2020 0105   GLUCOSEU NEGATIVE 11/02/2020 0105   HGBUR MODERATE (A) 11/02/2020 0105   BILIRUBINUR postive 12/09/2020 1519   KETONESUR NEGATIVE 11/02/2020 0105   PROTEINUR Positive (A) 12/09/2020 1519   PROTEINUR 100 (A) 11/02/2020 0105   UROBILINOGEN 2.0 (A) 12/09/2020 1519   NITRITE positive 12/09/2020 1519   NITRITE NEGATIVE 11/02/2020 0105   LEUKOCYTESUR Large (3+) (A) 12/09/2020 1519   LEUKOCYTESUR NEGATIVE 11/02/2020 0105    Radiological Exams on Admission: No results found.  EKG: Ordered  Assessment/Plan Active Problems:   Acute hepatic encephalopathy   AMS (altered mental status)  (please populate well all problems here in Problem List. (For example, if patient is on BP meds at home and you resume or decide to hold them, it is a problem that needs to be her. Same for CAD, COPD, HLD and so on)  Acute hepatic encephalopathy -Likely secondary to noncompliance with lactulose as well as chronic constipation probably due to narcotic use -Educate patient and her family at bedside regarding risk and benefit of compliant with encephalopathy preventive measures, husband voiced understanding.  Chronic hepatic failure secondary to [place] -MELD=19, estimated 43-monthmortality 6% -Outpatient liver transplant center follow-up  Recurrent ascites -Diagnostic paracentesis result pending, ordered IR guided large paracentesis tomorrow -Potassium level normal, restart p.o. Lasix -Start albumin infusion every 6 Hours x2 days, and likely will need extra albumin infusion tomorrow after paracentesis. -Probably not a good candidate for TI PS given strong  history of hepatic encephalopathy  Hepatic coagulopathy -P.o. vitamin K -INR in AM  Chronic iron deficiency anemia -H&H stable, continue iron supplement  Chronic constipation -Resume p.o. lactulose, starting from twice daily and then titrate -Soap and suds enema x1.  Metabolic acidosis, non-anion gap -No diarrhea, creatinine level normal -Probably related to liver failure, check VBG and repeat  CMP tomorrow.  Chronic thrombocytopenia -Secondary to portal hypertension -No chemical DVT prophylaxis  DVT prophylaxis: SCD Code Status: Full Code Family Communication: Husband at bedside Disposition Plan: Expect less than 2 midnight hospital stay, likely can be discharged after paracentesis and improved mentation and ammonium level. Consults called: None Admission status: Tele Obs   Lequita Halt MD [facility] Pager [phone]  12/29/2020, 7:09 PM

## 2020-12-29 NOTE — ED Provider Notes (Signed)
[facility] EMERGENCY DEPARTMENT Provider Note   CSN: [id number] Arrival date & time: 12/29/20  1428     History Chief Complaint  Patient presents with  . Altered Mental Status    Megan Roberson is a 65 y.o. female.  65 yo F with a significant past medical history of cirrhosis secondary to [facility] requiring recurrent paracentesis.  Here with change in mental status worse over the past week.  Worsening confusion and had a period this morning where the husband was unable to even wake her up.  She is now awake but is still more altered than typical.  She has been complaining of left lower abdominal pain off and on.  No reported fevers.  Had been eating and drinking normally until last night.  Has been constipated for the past couple days despite her home medications.  The history is provided by the patient.  Altered Mental Status Presenting symptoms: confusion, disorientation and lethargy   Severity:  Severe Most recent episode:  More than 2 days ago Episode history:  Continuous Duration:  1 week Timing:  Constant Progression:  Worsening Chronicity:  New Context comment:  Cirrhosis Associated symptoms: abdominal pain   Associated symptoms: no fever, no headaches, no nausea, no palpitations and no vomiting        Past Medical History:  Diagnosis Date  . Anxiety and depression   . Cholelithiasis without cholecystitis   . Chronic abdominal pain   . Chronic nausea    + abd pain  . Cirrhosis of liver with ascites (HCC)    Alc cirrhosis; hx of multiple abd paracentesis, +esoph varices (+banding), pancytopenia, hx of hepatic enceph, hx of hydrothorax  . Esophageal candidiasis ([place]) 11/2020   Dr. Noreene Larsson (difluc contraind b/c prolonged QT)  . Heart murmur    Echo 10/06/17 [facility]): Normal LV/RV size, basal septal hypertophy-normal variant, LVEF 65-70%, normal LV/RV function, est RAP 5 mmHg, no sign valvular stenosis or regurg--trace MR/TR/PR  . History of  abnormal cervical Pap smear   . History of pneumonia   . Hypothyroidism   . Insomnia   . Nephrolithiasis    right renal pelvis, 13-49m nonobst stone  . Neuropathy    unclear hx  . Osteoarthritis, multiple sites    primarily hands and knees  . Osteoporosis 02/09/2018  . Ovarian cyst    bilat.  Stable on MRI pelvis 11/2019 and CT abd/pelv 09/2020--no f/u imaging indicated.  . Pancytopenia ([place]   . Prolonged QT interval   . Splenomegaly   . Thrombocytopenia ([place] 12/01/2019   severe, hx of plts down into 30s    Patient Active Problem List   Diagnosis Date Noted  . AMS (altered mental status) 12/29/2020  . Pain in left ankle and joints of left foot 12/04/2020  . Gastric ulcer   . Closed traumatic minimally displaced fracture of left calcaneus 09/23/2020  . Hydrothorax   . Ascites 09/03/2020  . Closed left ankle fracture 09/03/2020  . Mood disorder ([place] 08/17/2020  . Right lower lobe pneumonia 08/16/2020  . Asymptomatic bacteriuria   . Hypothyroidism 08/14/2020  . Displaced fracture of medial cuneiform of left foot, initial encounter for closed fracture 08/14/2020  . Failure to thrive in adult 08/14/2020  . Weight loss 08/14/2020  . UTI (urinary tract infection) 08/14/2020  . Fall at home, initial encounter 08/14/2020  . Generalized weakness 08/13/2020  . Pain in right knee 06/04/2020  . Pain in left hip 06/04/2020  . Nausea 05/19/2020  . Prolonged  QT interval 05/19/2020  . Elevated brain natriuretic peptide (BNP) level 05/19/2020  . Hypoalbuminemia 05/19/2020  . Cough 05/19/2020  . Atelectasis 05/19/2020  . Hypomagnesemia 05/19/2020  . Cysts of both ovaries 06/15/2019  . Lumbar pain 02/27/2019  . Closed fracture of left tibial plateau 05/17/18 07/08/2018  . 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 Osteoporosis 02/09/2018  . Goals of care, counseling/discussion 01/18/2018  . Refractory ascites 11/25/2017  . Cellulitis, abdominal wall 11/25/2017  . SOB  (shortness of breath) 11/25/2017  . Protein-calorie malnutrition, severe ([place]) 11/06/2017  . Ogilvie's syndrome   . Abdominal distension   . Abdominal pain   . Liver failure without hepatic coma ([place])   . Thrombocytopenia ([place])   . Palliative care by specialist   . Acute hepatic encephalopathy   . Closed fracture of lumbar spine without lesion of spinal cord ([place])   . Recurrent falls 06/04/2017  . 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 Cirrhosis of liver with ascites ([place]) 04/21/2017  . Pancytopenia ([place]) 04/21/2017  . History of open reduction and internal fixation (ORIF) procedure 04/21/2017  . Visit for suture removal 04/21/2017  . Left Olecranon fracture 04/21/2017  . Hypokalemia 04/21/2017    Past Surgical History:  Procedure Laterality Date  . AUGMENTATION MAMMAPLASTY Bilateral    20 years ago  . BIOPSY  03/15/2018   gastric.  Procedure: BIOPSY;  Surgeon: Yetta Flock, MD;  Location: [facility];  Service: Gastroenterology;;  Gastric  . BIOPSY  11/14/2020   Procedure: BIOPSY;  Surgeon: Yetta Flock, MD;  Location: [facility];  Service: Gastroenterology;;  . COLONOSCOPY  2019  . COLONOSCOPY WITH ESOPHAGOGASTRODUODENOSCOPY (EGD) AND ESOPHAGEAL DILATION (ED)    . COLONOSCOPY WITH PROPOFOL N/A 03/15/2018   Redundant colon, severe looping, very edematous colon c/w portal HTN.  Procedure: COLONOSCOPY WITH PROPOFOL;  Surgeon: Yetta Flock, MD;  Location: [facility];  Service: Gastroenterology;  Laterality: N/A;  . ESOPHAGOGASTRODUODENOSCOPY  11/14/2020   esoph candidiasis, nonbleeding ulcer (path benign), portal hypertensive gastropathy.  No varices.  . ESOPHAGOGASTRODUODENOSCOPY (EGD) WITH PROPOFOL N/A 03/15/2018   Gastritis, small gastric ulcer. Procedure: ESOPHAGOGASTRODUODENOSCOPY (EGD) WITH PROPOFOL;  Surgeon: Yetta Flock, MD;  Location: [facility];  Service: Gastroenterology;  Laterality: N/A;  . ESOPHAGOGASTRODUODENOSCOPY (EGD) WITH  PROPOFOL N/A 11/14/2020   Procedure: ESOPHAGOGASTRODUODENOSCOPY (EGD) WITH PROPOFOL;  Surgeon: Yetta Flock, MD;  Location: [facility];  Service: Gastroenterology;  Laterality: N/A;  . ESOPHAGOGASTRODUODENOSCOPY W/ BANDING     Varices  . FRACTURE SURGERY Left    x 2 L elbow and hip left   . HARDWARE REMOVAL Left 09/17/2017   Procedure: HARDWARE REMOVAL LEFT OLECRANON;  Surgeon: Altamese [place], MD;  Location: [facility];  Service: Orthopedics;  Laterality: Left;  . IR PARACENTESIS  11/18/2017  . IR PARACENTESIS  11/26/2017  . IR PARACENTESIS  03/14/2018  . IR PARACENTESIS  08/08/2020  . IR PARACENTESIS  09/23/2020  . IR PARACENTESIS  10/22/2020  . IR PARACENTESIS  10/30/2020  . IR PARACENTESIS  11/12/2020  . IR PARACENTESIS  11/20/2020  . IR PARACENTESIS  11/27/2020  . IR PARACENTESIS  12/05/2020  . IR PARACENTESIS  12/12/2020  . IR PARACENTESIS  12/18/2020  . KNEE SURGERY Right    open incision for ligaments  . ORIF ELBOW FRACTURE Left 04/30/2017   Procedure: REVISION OPEN REDUCTION INTERNAL FIXATION (ORIF) ELBOW/OLECRANON FRACTURE;  Surgeon: Altamese [place], MD;  Location: [facility];  Service: Orthopedics;  Laterality: Left;  . ORIF HIP FRACTURE  Left   . REPAIR EXTENSOR TENDON Left 12/29/2019   Procedure: LEFT LONG AND LEFT RING FRINGER EXTENSOR REALIGNAMENT WITH POSSIBLE TENDON TRANSFER;  Surgeon: Charlotte Crumb, MD;  Location: [facility];  Service: Orthopedics;  Laterality: Left;  . TRANSTHORACIC ECHOCARDIOGRAM  05/19/2020   extremely poor acoustic window/technically poor study-->normal  . virtual colonoscopy  04/24/2019   no polyps, no mass, no apple core lesion, no stricture     OB History    Gravida  2   Para  2   Term  2   Preterm      AB      Living  1     SAB      IAB      Ectopic      Multiple      Live Births  2           Family History  Adopted: Yes  Problem Relation Age of Onset  . Alcohol abuse Daughter     Social History   Tobacco Use  . Smoking  status: Never Smoker  . Smokeless tobacco: Never Used  Vaping Use  . Vaping Use: Never used  Substance Use Topics  . Alcohol use: No  . Drug use: No    Home Medications Prior to Admission medications   Medication Sig Start Date End Date Taking? Authorizing Provider  diclofenac Sodium (VOLTAREN) 1 % GEL Apply 2 g topically daily as needed (for pain). 04/23/20  Yes [provider]  feeding supplement (ENSURE ENLIVE / ENSURE PLUS) LIQD Take 237 mLs by mouth 2 (two) times daily between meals. 09/08/20  Yes Hosie Poisson, MD  ferrous sulfate 325 (65 FE) MG EC tablet Take 1 tablet (325 mg total) by mouth in the morning and at bedtime. 09/17/20  Yes Armbruster, Carlota Raspberry, MD  lactulose (CHRONULAC) 10 GM/15ML solution Take 30 mLs (20 g total) by mouth 3 (three) times daily. Patient taking differently: Take 20 g by mouth 3 (three) times daily as needed for mild constipation. 09/08/20  Yes Hosie Poisson, MD  levothyroxine (SYNTHROID, LEVOTHROID) 75 MCG tablet Take 1 tablet (75 mcg total) by mouth daily before breakfast. 12/02/17  Yes Aline August, MD  Multiple Vitamin (MULTIVITAMIN WITH MINERALS) TABS tablet Take 1 tablet by mouth daily. 09/09/20  Yes Hosie Poisson, MD  omeprazole (PRILOSEC) 40 MG capsule Take 1 capsule (40 mg total) by mouth 2 (two) times daily for 240 doses. 11/14/20 03/14/21 Yes Armbruster, Carlota Raspberry, MD  ondansetron (ZOFRAN ODT) 4 MG disintegrating tablet Take 1 tablet (4 mg total) by mouth every 8 (eight) hours as needed for nausea or vomiting. 11/20/20  Yes McGowen, Adrian Blackwater, MD  Oxycodone HCl 10 MG TABS 1 tab po tid prn pain Patient taking differently: Take 10 mg by mouth 3 (three) times daily as needed. 1 tab po tid prn pain 12/09/20  Yes McGowen, Adrian Blackwater, MD  potassium chloride SA (KLOR-CON) 20 MEQ tablet Take 2 tablets (40 mEq total) by mouth daily. 09/08/20  Yes Hosie Poisson, MD  sertraline (ZOLOFT) 100 MG tablet Take 100 mg by mouth daily. 08/17/20  Yes [provider]  sertraline (ZOLOFT) 50 MG tablet Take 1 tablet (50 mg total) by mouth daily. 08/21/20  Yes Nita Sells, MD  spironolactone (ALDACTONE) 100 MG tablet Take 2 tablets (200 mg total) by mouth daily. 09/09/20  Yes Hosie Poisson, MD  Suvorexant (BELSOMRA) 10 MG TABS 1 tab po about 30-60 min prior to bedtime nightly 12/09/20  Yes  McGowen, Adrian Blackwater, MD    Allergies    Penicillins, Sulfa antibiotics, Tramadol, and Tylenol [acetaminophen]  Review of Systems   Review of Systems  Constitutional: Positive for activity change. Negative for chills and fever.  HENT: Negative for congestion and rhinorrhea.   Eyes: Negative for redness and visual disturbance.  Respiratory: Negative for shortness of breath and wheezing.   Cardiovascular: Negative for chest pain and palpitations.  Gastrointestinal: Positive for abdominal pain. Negative for nausea and vomiting.  Genitourinary: Negative for dysuria and urgency.  Musculoskeletal: Negative for arthralgias and myalgias.  Skin: Negative for pallor and wound.  Neurological: Negative for dizziness and headaches.  Psychiatric/Behavioral: Positive for confusion.    Physical Exam Updated Vital Signs BP 115/71   Pulse 76   Temp 98.1 F (36.7 C)   Resp 12   Ht 5' 7"  (1.702 m)   Wt 61.2 kg   SpO2 98%   BMI 21.14 kg/m   Physical Exam Vitals and nursing note reviewed.  Constitutional:      General: She is not in acute distress.    Appearance: She is well-developed and well-nourished. She is not diaphoretic.     Comments: Cachectic, chronically ill-appearing.  HENT:     Head: Normocephalic and atraumatic.  Eyes:     Extraocular Movements: EOM normal.     Pupils: Pupils are equal, round, and reactive to light.  Cardiovascular:     Rate and Rhythm: Normal rate and regular rhythm.     Heart sounds: No murmur heard. No friction rub. No gallop.   Pulmonary:     Effort: Pulmonary effort is normal.     Breath sounds: No wheezing or  rales.  Abdominal:     General: There is distension.     Palpations: Abdomen is soft.     Tenderness: There is no abdominal tenderness.     Comments: Distended abdomen with positive fluid wave.  Mild diffuse abdominal tenderness  Musculoskeletal:        General: No tenderness or edema.     Cervical back: Normal range of motion and neck supple.  Skin:    General: Skin is warm and dry.  Neurological:     Mental Status: She is alert and oriented to person, place, and time.  Psychiatric:        Mood and Affect: Mood and affect normal.        Behavior: Behavior normal.     ED Results / Procedures / Treatments   Labs (all labs ordered are listed, but only abnormal results are displayed) Labs Reviewed  RESP PANEL BY RT-PCR (FLU A&B, COVID) ARPGX2 - Abnormal; Notable for the following components:      Result Value   SARS Coronavirus 2 by RT PCR POSITIVE (*)    All other components within normal limits  COMPREHENSIVE METABOLIC PANEL - Abnormal; Notable for the following components:   CO2 18 (*)    Calcium 8.6 (*)    Total Protein 6.0 (*)    Albumin 2.9 (*)    AST 44 (*)    Alkaline Phosphatase 131 (*)    Total Bilirubin 4.3 (*)    All other components within normal limits  CBC - Abnormal; Notable for the following components:   RBC 3.28 (*)    Hemoglobin 9.0 (*)    HCT 29.9 (*)    RDW 22.1 (*)    Platelets 47 (*)    All other components within normal limits  PROTIME-INR - Abnormal; Notable  for the following components:   Prothrombin Time 18.0 (*)    INR 1.6 (*)    All other components within normal limits  AMMONIA - Abnormal; Notable for the following components:   Ammonia 75 (*)    All other components within normal limits  BODY FLUID CELL COUNT WITH DIFFERENTIAL - Abnormal; Notable for the following components:   Color, Fluid YELLOW (*)    Appearance, Fluid HAZY (*)    Monocyte-Macrophage-Serous Fluid 49 (*)    All other components within normal limits  GRAM STAIN   BODY FLUID CULTURE  CULTURE, BODY FLUID W GRAM STAIN -BOTTLE  LACTATE DEHYDROGENASE, PLEURAL OR PERITONEAL FLUID  GLUCOSE, PLEURAL OR PERITONEAL FLUID  PROTEIN, PLEURAL OR PERITONEAL FLUID  ALBUMIN, PLEURAL OR PERITONEAL FLUID  URINALYSIS, ROUTINE W REFLEX MICROSCOPIC  PROTIME-INR  CBC  PATHOLOGIST SMEAR REVIEW  COMPREHENSIVE METABOLIC PANEL  AMMONIA  CBG MONITORING, ED    EKG None  Radiology No results found.  Procedures .Paracentesis  Date/Time: 12/29/2020 8:46 PM Performed by: Deno Etienne, DO Authorized by: Deno Etienne, DO   Consent:    Consent obtained:  Verbal   Consent given by:  Patient and spouse   Risks, benefits, and alternatives were discussed: yes     Risks discussed:  Bleeding, bowel perforation, infection and pain   Alternatives discussed:  No treatment, delayed treatment, alternative treatment, observation and referral Universal protocol:    Procedure explained and questions answered to patient or proxy's satisfaction: yes     Immediately prior to procedure, a time out was called: yes     Patient identity confirmed:  Arm band Pre-procedure details:    Procedure purpose:  Diagnostic   Preparation: Patient was prepped and draped in usual sterile fashion   Anesthesia:    Anesthesia method:  Local infiltration   Local anesthetic:  Lidocaine 2% WITH epi Procedure details:    Needle gauge:  22   Ultrasound guidance: yes     Puncture site:  R lower quadrant   Fluid removed amount:  60   Fluid appearance:  Clear and amber   Dressing:  Adhesive bandage Post-procedure details:    Procedure completion:  Tolerated well, no immediate complications     Medications Ordered in ED Medications  lactulose (CHRONULAC) 10 GM/15ML solution 30 g (has no administration in time range)  rifaximin (XIFAXAN) tablet 550 mg (has no administration in time range)  albumin human 25 % solution 25 g (has no administration in time range)  lactulose (CHRONULAC) 10 GM/15ML  solution 30 g (has no administration in time range)  phytonadione (VITAMIN K) tablet 5 mg (has no administration in time range)  oxyCODONE (Oxy IR/ROXICODONE) immediate release tablet 10 mg (has no administration in time range)  spironolactone (ALDACTONE) tablet 200 mg (has no administration in time range)  furosemide (LASIX) tablet 40 mg (has no administration in time range)  sertraline (ZOLOFT) tablet 150 mg (has no administration in time range)  levothyroxine (SYNTHROID) tablet 75 mcg (has no administration in time range)  pantoprazole (PROTONIX) EC tablet 40 mg (has no administration in time range)  ferrous sulfate tablet 325 mg (has no administration in time range)  feeding supplement (ENSURE ENLIVE / ENSURE PLUS) liquid 237 mL (has no administration in time range)  multivitamin with minerals tablet 1 tablet (has no administration in time range)  potassium chloride SA (KLOR-CON) CR tablet 40 mEq (has no administration in time range)  diclofenac Sodium (VOLTAREN) 1 % topical gel 2 g (has no  administration in time range)  sodium chloride 0.9 % bolus 1,000 mL (0 mLs Intravenous Stopped 12/29/20 2018)  lidocaine-EPINEPHrine (XYLOCAINE W/EPI) 1 %-1:100000 (with pres) injection 10 mL (10 mLs Intradermal Given 12/29/20 1707)    ED Course  I have reviewed the triage vital signs and the nursing notes.  Pertinent labs & imaging results that were available during my care of the patient were reviewed by me and considered in my medical decision making (see chart for details).    MDM Rules/Calculators/A&P                          65 yo F with a chief complaints of altered mental status.  Has a history of cirrhosis thought to be secondary to [place].  The patient has been taking her medications but not had a bowel movement in a couple days.  Like has hepatic encephalopathy based on history.  Will obtain a laboratory evaluation.  With abdominal pain will obtain a diagnostic paracentesis.  Patient's  ammonia is elevated.  Will give a dose of lactulose.  Discussed with hospitalist for admission.  The patients results and plan were reviewed and discussed.   Any x-rays performed were independently reviewed by myself.   Differential diagnosis were considered with the presenting HPI.  Medications  lactulose (CHRONULAC) 10 GM/15ML solution 30 g (has no administration in time range)  rifaximin (XIFAXAN) tablet 550 mg (has no administration in time range)  albumin human 25 % solution 25 g (has no administration in time range)  lactulose (CHRONULAC) 10 GM/15ML solution 30 g (has no administration in time range)  phytonadione (VITAMIN K) tablet 5 mg (has no administration in time range)  oxyCODONE (Oxy IR/ROXICODONE) immediate release tablet 10 mg (has no administration in time range)  spironolactone (ALDACTONE) tablet 200 mg (has no administration in time range)  furosemide (LASIX) tablet 40 mg (has no administration in time range)  sertraline (ZOLOFT) tablet 150 mg (has no administration in time range)  levothyroxine (SYNTHROID) tablet 75 mcg (has no administration in time range)  pantoprazole (PROTONIX) EC tablet 40 mg (has no administration in time range)  ferrous sulfate tablet 325 mg (has no administration in time range)  feeding supplement (ENSURE ENLIVE / ENSURE PLUS) liquid 237 mL (has no administration in time range)  multivitamin with minerals tablet 1 tablet (has no administration in time range)  potassium chloride SA (KLOR-CON) CR tablet 40 mEq (has no administration in time range)  diclofenac Sodium (VOLTAREN) 1 % topical gel 2 g (has no administration in time range)  sodium chloride 0.9 % bolus 1,000 mL (0 mLs Intravenous Stopped 12/29/20 2018)  lidocaine-EPINEPHrine (XYLOCAINE W/EPI) 1 %-1:100000 (with pres) injection 10 mL (10 mLs Intradermal Given 12/29/20 1707)    Vitals:   12/29/20 1600 12/29/20 1708 12/29/20 1815 12/29/20 1845  BP: 122/76 132/81 137/87 115/71  Pulse: 78 76  89 76  Resp: 12 15 18 12   Temp:      SpO2: 97% 97% 99% 98%  Weight:      Height:        Final diagnoses:  Hepatic encephalopathy ([place])    Admission/ observation were discussed with the admitting physician, patient and/or family and they are comfortable with the plan.    Final Clinical Impression(s) / ED Diagnoses Final diagnoses:  Hepatic encephalopathy [facility])    Rx / DC Orders ED Discharge Orders    None       Deno Etienne, DO 12/29/20  2047  

## 2020-12-29 NOTE — ED Notes (Signed)
Attempted report x1. 

## 2020-12-29 NOTE — ED Triage Notes (Signed)
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 Large and taunt. Patient is alert however doesn't know where she is at but does know her husband.

## 2020-12-30 ENCOUNTER — Ambulatory Visit: Payer: Medicare HMO

## 2020-12-30 ENCOUNTER — Telehealth: Payer: Self-pay

## 2020-12-30 ENCOUNTER — Inpatient Hospital Stay (HOSPITAL_COMMUNITY): Payer: Medicare HMO

## 2020-12-30 ENCOUNTER — Other Ambulatory Visit: Payer: Self-pay

## 2020-12-30 ENCOUNTER — Observation Stay (HOSPITAL_COMMUNITY): Payer: Medicare HMO

## 2020-12-30 DIAGNOSIS — F32A Depression, unspecified: Secondary | ICD-10-CM | POA: Diagnosis present

## 2020-12-30 DIAGNOSIS — K7581 Nonalcoholic steatohepatitis (NASH): Secondary | ICD-10-CM | POA: Diagnosis present

## 2020-12-30 DIAGNOSIS — K7290 Hepatic failure, unspecified without coma: Secondary | ICD-10-CM | POA: Diagnosis not present

## 2020-12-30 DIAGNOSIS — D684 Acquired coagulation factor deficiency: Secondary | ICD-10-CM | POA: Diagnosis present

## 2020-12-30 DIAGNOSIS — K7460 Unspecified cirrhosis of liver: Secondary | ICD-10-CM | POA: Diagnosis present

## 2020-12-30 DIAGNOSIS — I8510 Secondary esophageal varices without bleeding: Secondary | ICD-10-CM | POA: Diagnosis present

## 2020-12-30 DIAGNOSIS — E43 Unspecified severe protein-calorie malnutrition: Secondary | ICD-10-CM | POA: Diagnosis present

## 2020-12-30 DIAGNOSIS — Z681 Body mass index (BMI) 19 or less, adult: Secondary | ICD-10-CM | POA: Diagnosis not present

## 2020-12-30 DIAGNOSIS — E872 Acidosis: Secondary | ICD-10-CM | POA: Diagnosis present

## 2020-12-30 DIAGNOSIS — R188 Other ascites: Secondary | ICD-10-CM | POA: Diagnosis present

## 2020-12-30 DIAGNOSIS — I10 Essential (primary) hypertension: Secondary | ICD-10-CM | POA: Diagnosis present

## 2020-12-30 DIAGNOSIS — X58XXXA Exposure to other specified factors, initial encounter: Secondary | ICD-10-CM | POA: Diagnosis present

## 2020-12-30 DIAGNOSIS — S32592A Other specified fracture of left pubis, initial encounter for closed fracture: Secondary | ICD-10-CM | POA: Diagnosis present

## 2020-12-30 DIAGNOSIS — R4182 Altered mental status, unspecified: Secondary | ICD-10-CM | POA: Diagnosis present

## 2020-12-30 DIAGNOSIS — U071 COVID-19: Secondary | ICD-10-CM | POA: Diagnosis present

## 2020-12-30 DIAGNOSIS — G8929 Other chronic pain: Secondary | ICD-10-CM | POA: Diagnosis present

## 2020-12-30 DIAGNOSIS — K7210 Chronic hepatic failure without coma: Secondary | ICD-10-CM | POA: Diagnosis present

## 2020-12-30 DIAGNOSIS — Z7189 Other specified counseling: Secondary | ICD-10-CM | POA: Diagnosis not present

## 2020-12-30 DIAGNOSIS — K7200 Acute and subacute hepatic failure without coma: Secondary | ICD-10-CM | POA: Diagnosis present

## 2020-12-30 DIAGNOSIS — Z9114 Patient's other noncompliance with medication regimen: Secondary | ICD-10-CM | POA: Diagnosis not present

## 2020-12-30 DIAGNOSIS — K5909 Other constipation: Secondary | ICD-10-CM | POA: Diagnosis present

## 2020-12-30 DIAGNOSIS — D509 Iron deficiency anemia, unspecified: Secondary | ICD-10-CM | POA: Diagnosis present

## 2020-12-30 DIAGNOSIS — M4854XA Collapsed vertebra, not elsewhere classified, thoracic region, initial encounter for fracture: Secondary | ICD-10-CM | POA: Diagnosis present

## 2020-12-30 DIAGNOSIS — J90 Pleural effusion, not elsewhere classified: Secondary | ICD-10-CM | POA: Diagnosis present

## 2020-12-30 DIAGNOSIS — R64 Cachexia: Secondary | ICD-10-CM | POA: Diagnosis present

## 2020-12-30 DIAGNOSIS — R296 Repeated falls: Secondary | ICD-10-CM | POA: Diagnosis present

## 2020-12-30 DIAGNOSIS — R0602 Shortness of breath: Secondary | ICD-10-CM | POA: Diagnosis not present

## 2020-12-30 DIAGNOSIS — D6959 Other secondary thrombocytopenia: Secondary | ICD-10-CM | POA: Diagnosis present

## 2020-12-30 DIAGNOSIS — M4856XA Collapsed vertebra, not elsewhere classified, lumbar region, initial encounter for fracture: Secondary | ICD-10-CM | POA: Diagnosis present

## 2020-12-30 LAB — PATHOLOGIST SMEAR REVIEW

## 2020-12-30 LAB — CBC
HCT: 24.1 % — ABNORMAL LOW (ref 36.0–46.0)
Hemoglobin: 7.4 g/dL — ABNORMAL LOW (ref 12.0–15.0)
MCH: 27.8 pg (ref 26.0–34.0)
MCHC: 30.7 g/dL (ref 30.0–36.0)
MCV: 90.6 fL (ref 80.0–100.0)
Platelets: 34 10*3/uL — ABNORMAL LOW (ref 150–400)
RBC: 2.66 MIL/uL — ABNORMAL LOW (ref 3.87–5.11)
RDW: 21.8 % — ABNORMAL HIGH (ref 11.5–15.5)
WBC: 4 10*3/uL (ref 4.0–10.5)
nRBC: 0 % (ref 0.0–0.2)

## 2020-12-30 LAB — COMPREHENSIVE METABOLIC PANEL
ALT: 20 U/L (ref 0–44)
AST: 36 U/L (ref 15–41)
Albumin: 2.9 g/dL — ABNORMAL LOW (ref 3.5–5.0)
Alkaline Phosphatase: 99 U/L (ref 38–126)
Anion gap: 9 (ref 5–15)
BUN: 14 mg/dL (ref 8–23)
CO2: 22 mmol/L (ref 22–32)
Calcium: 8.5 mg/dL — ABNORMAL LOW (ref 8.9–10.3)
Chloride: 104 mmol/L (ref 98–111)
Creatinine, Ser: 0.8 mg/dL (ref 0.44–1.00)
GFR, Estimated: >60 mL/min (ref >60)
Glucose, Bld: 82 mg/dL (ref 70–99)
Potassium: 4.6 mmol/L (ref 3.5–5.1)
Sodium: 135 mmol/L (ref 135–145)
Total Bilirubin: 4.4 mg/dL — ABNORMAL HIGH (ref 0.3–1.2)
Total Protein: 5.3 g/dL — ABNORMAL LOW (ref 6.5–8.1)

## 2020-12-30 LAB — URINALYSIS, ROUTINE W REFLEX MICROSCOPIC
Bilirubin Urine: NEGATIVE
Glucose, UA: NEGATIVE mg/dL
Ketones, ur: NEGATIVE mg/dL
Nitrite: NEGATIVE
Protein, ur: 30 mg/dL — AB
RBC / HPF: >50 RBC/hpf — ABNORMAL HIGH (ref 0–5)
Specific Gravity, Urine: 1.031 — ABNORMAL HIGH (ref 1.005–1.030)
WBC, UA: >50 WBC/hpf — ABNORMAL HIGH (ref 0–5)
pH: 7 (ref 5.0–8.0)

## 2020-12-30 LAB — MAGNESIUM: Magnesium: 2 mg/dL (ref 1.7–2.4)

## 2020-12-30 LAB — PROTIME-INR
INR: 1.8 — ABNORMAL HIGH (ref 0.8–1.2)
Prothrombin Time: 20.3 seconds — ABNORMAL HIGH (ref 11.4–15.2)

## 2020-12-30 LAB — TYPE AND SCREEN
ABO/RH(D): O POS
Antibody Screen: NEGATIVE

## 2020-12-30 LAB — AMMONIA: Ammonia: 86 umol/L — ABNORMAL HIGH (ref 9–35)

## 2020-12-30 LAB — BRAIN NATRIURETIC PEPTIDE: B Natriuretic Peptide: 43.3 pg/mL (ref 0.0–100.0)

## 2020-12-30 MED ORDER — LACTULOSE 10 GM/15ML PO SOLN
30.0000 g | Freq: Three times a day (TID) | ORAL | Status: DC
  Administered 2020-12-30 – 2021-01-03 (×11): 30 g via ORAL
  Filled 2020-12-30 (×12): qty 45

## 2020-12-30 MED ORDER — IOHEXOL 300 MG/ML  SOLN
80.0000 mL | Freq: Once | INTRAMUSCULAR | Status: AC | PRN
  Administered 2020-12-30: 80 mL via INTRAVENOUS

## 2020-12-30 MED ORDER — OXYCODONE HCL 5 MG PO TABS
5.0000 mg | ORAL_TABLET | Freq: Three times a day (TID) | ORAL | Status: DC | PRN
  Administered 2020-12-30 – 2020-12-31 (×4): 5 mg via ORAL
  Filled 2020-12-30 (×4): qty 1

## 2020-12-30 MED ORDER — ALBUMIN HUMAN 25 % IV SOLN: 50.0000 g | Freq: Every day | INTRAVENOUS | Status: DC | PRN

## 2020-12-30 MED ORDER — RESOURCE THICKENUP CLEAR PO POWD
ORAL | Status: DC | PRN
  Filled 2020-12-30: qty 125

## 2020-12-30 MED ORDER — ONDANSETRON HCL 4 MG/2ML IJ SOLN
4.0000 mg | Freq: Four times a day (QID) | INTRAMUSCULAR | Status: DC | PRN
  Administered 2020-12-30 – 2021-01-02 (×5): 4 mg via INTRAVENOUS
  Filled 2020-12-30 (×6): qty 2

## 2020-12-30 MED ORDER — PANTOPRAZOLE SODIUM 40 MG IV SOLR
40.0000 mg | Freq: Two times a day (BID) | INTRAVENOUS | Status: DC
  Administered 2020-12-30 – 2020-12-31 (×3): 40 mg via INTRAVENOUS
  Filled 2020-12-30 (×3): qty 40

## 2020-12-30 NOTE — Progress Notes (Signed)
OT Cancellation Note  Patient Details Name: Destinae Neubecker MRN: [id number] DOB: 30-Jun-1956   Cancelled Treatment:    Reason Eval/Treat Not Completed: (P) Patient at procedure or test/ unavailable (radiology)  [facility] 12/30/2020, 1:39 PM Maurie Boettcher, OT/L   Acute OT Clinical Specialist Acute Rehabilitation Services Pager [phone] Office [phone]

## 2020-12-30 NOTE — Progress Notes (Addendum)
PROGRESS NOTE                                                                                                                                                                                                             Patient Demographics:    Megan Roberson, is a 65 y.o. female, DOB - Jul 16, 1956, JJO:[id number]  Outpatient Primary MD for the patient is McGowen, Adrian Blackwater, MD    LOS - 0  Admit date - 12/29/2020    Chief Complaint  Patient presents with  . Altered Mental Status       Brief Narrative (HPI from H&P) - Megan Roberson is a 65 y.o. 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   Subjective:    Megan Roberson today has, No headache, No chest pain, No abdominal pain - No Nausea, No new weakness tingling or numbness, no SOB   Assessment  & Plan :      1. Acute hepatic encephalopathy noncompliance with lactulose - she has been counseled on compliance with lactulose, has been placed on lactulose and Xifaxan, mentation has improved, PT-OT and speech evaluation.  2.  Incidental COVID-19 infection.  Monitor.  Will check baseline chest x-ray and CRP.  3.  Hypertension and recurrent ascites.  Requires weekly paracentesis for therapeutic purposes, lactulose and Xifaxan as above, therapeutic paracentesis ordered on 12/30/2020 with albumin.  Long-term prognosis appears poor.  4.  Chronic iron deficiency anemia and anemia of disease + chronic thrombocytopenia  due to [place].  Type screen, PPI, no signs of active bleeding, monitor.  5.  Multiple remote fracture deformities of the thoracolumbar spine and left hemipelvis. Superimposed subacute fracture of the left inferior pubic ramus.  PT-OT, weightbearing as tolerated and monitor.  Currently relatively symptom-free.  6. Chronic abdominal pain.  Supportive care.    SpO2: 98 %  Recent Labs  Lab 12/23/20 2049 12/29/20 1505 12/29/20 1654 12/29/20 1924 12/30/20 0400 12/30/20 0719  WBC 6.4 6.6  --   --  4.0  --  HGB 8.3* 9.0*  --   --  7.4*  --   HCT 27.8* 29.9*  --   --  24.1*  --   PLT 39* 47*  --   --  34*  --   BNP  --   --   --   --   --  43.3  AST 38 44*  --   --  36  --   ALT 19 22  --   --  20  --   ALKPHOS 131* 131*  --   --  99  --   BILITOT 4.6* 4.3*  --   --  4.4*  --   ALBUMIN 3.0* 2.9*  --   --  2.9*  --   INR  --   --  1.6*  --  1.8*  --   SARSCOV2NAA  --   --   --  POSITIVE*  --   --               Condition -  Guarded  Family Communication  : Husband  [name] [phone] (? Wrong #) & [phone] on 12/30/2020 - 11.04 am no response.  Code Status : Full  Consults  :    PUD Prophylaxis : PPI   Procedures  :            Disposition Plan  :    Status is: Inpatient  Remains inpatient appropriate because:IV treatments appropriate due to intensity of illness or inability to take PO   Dispo: The patient is from: Home              Anticipated d/c is to: Home              Patient currently is not medically stable to d/c.   Difficult to place patient No  DVT Prophylaxis  :  SCDs    Lab Results  Component Value Date   PLT 34 (L) 12/30/2020   Lab Results  Component Value Date   INR 1.8 (H) 12/30/2020   INR 1.6 (H) 12/29/2020   INR 1.8 (H) 11/02/2020    Diet :  Diet Order            Diet NPO time specified Except for: Sips with Meds  Diet effective now                  Inpatient Medications  Scheduled Meds: . feeding supplement  237 mL  Oral BID BM  . ferrous sulfate  325 mg Oral Q breakfast  . furosemide  40 mg Oral Daily  . lactulose  30 g Oral TID  . levothyroxine  75 mcg Oral QAC breakfast  . multivitamin with minerals  1 tablet Oral Daily  . pantoprazole (PROTONIX) IV  40 mg Intravenous Q12H  . potassium chloride SA  40 mEq Oral Daily  . rifaximin  550 mg Oral BID  . sertraline  150 mg Oral Daily  . spironolactone  200 mg Oral Daily   Continuous Infusions: . albumin human     PRN Meds:.albumin human, diclofenac Sodium, ondansetron (ZOFRAN) IV, oxyCODONE  Antibiotics  :    Anti-infectives (From admission, onward)   Start     Dose/Rate Route Frequency Ordered Stop   12/29/20 2200  rifaximin (XIFAXAN) tablet 550 mg        550 mg Oral 2 times daily 12/29/20 1822         Time Spent in minutes  30   Prashant  Candiss Norse M.D on 12/30/2020 at 10:57 AM  To page go to [email or web address]   [facility] -  Office  [phone]    See all Orders from today for further details    Objective:   Vitals:   12/29/20 2122 12/29/20 2344 12/30/20 0707 12/30/20 0901  BP: 139/78 127/78  (!) 156/76  Pulse: 81 77 79 76  Resp: 19 18 (!) 22 16  Temp: 98.6 F (37 C) 97.8 F (36.6 C) 98.3 F (36.8 C) 98.4 F (36.9 C)  TempSrc: Oral Oral Oral   SpO2: 98% 98% 98% 98%  Weight: 56.1 kg     Height:        Wt Readings from Last 3 Encounters:  12/29/20 56.1 kg  12/23/20 54.4 kg  12/09/20 52.4 kg     Intake/Output Summary (Last 24 hours) at 12/30/2020 1057 Last data filed at 12/30/2020 1016 Gross per 24 hour  Intake 399.69 ml  Output 0 ml  Net 399.69 ml     Physical Exam  Awake Alert, No new F.N deficits,   [place].AT,PERRAL Supple Neck,No JVD, No cervical lymphadenopathy appriciated.  Symmetrical Chest wall movement, Good air movement bilaterally, CTAB RRR,No Gallops,Rubs or new Murmurs, No Parasternal Heave +ve B.Sounds, Abd ++ distended, No tenderness, No organomegaly appriciated, No rebound - guarding or  rigidity. No Cyanosis, Clubbing or edema, No new Rash or bruise      Data Review:    CBC Recent Labs  Lab 12/23/20 2049 12/29/20 1505 12/30/20 0400  WBC 6.4 6.6 4.0  HGB 8.3* 9.0* 7.4*  HCT 27.8* 29.9* 24.1*  PLT 39* 47* 34*  MCV 92.4 91.2 90.6  MCH 27.6 27.4 27.8  MCHC 29.9* 30.1 30.7  RDW 21.6* 22.1* 21.8*  LYMPHSABS 0.5*  --   --   MONOABS 1.0  --   --   EOSABS 0.0  --   --   BASOSABS 0.0  --   --     Recent Labs  Lab 12/23/20 2049 12/29/20 1505 12/29/20 1653 12/29/20 1654 12/30/20 0400 12/30/20 0719 12/30/20 0723  NA 132* 135  --   --  135  --   --   K 4.1 4.3  --   --  4.6  --   --   CL 103 103  --   --  104  --   --   CO2 22 18*  --   --  22  --   --   GLUCOSE 97 88  --   --  82  --   --   BUN 14 11  --   --  14  --   --   CREATININE 0.51 0.73  --   --  0.80  --   --   CALCIUM 8.2* 8.6*  --   --  8.5*  --   --   AST 38 44*  --   --  36  --   --   ALT 19 22  --   --  20  --   --   ALKPHOS 131* 131*  --   --  99  --   --   BILITOT 4.6* 4.3*  --   --  4.4*  --   --   ALBUMIN 3.0* 2.9*  --   --  2.9*  --   --   MG  --   --   --   --   --  2.0  --   INR  --   --   --  1.6* 1.8*  --   --   AMMONIA 57*  --  75*  --   --   --  86*  BNP  --   --   --   --   --  43.3  --     ------------------------------------------------------------------------------------------------------------------ No results for input(s): CHOL, HDL, LDLCALC, TRIG, CHOLHDL, LDLDIRECT in the last 72 hours.  No results found for: HGBA1C ------------------------------------------------------------------------------------------------------------------ No results for input(s): TSH, T4TOTAL, T3FREE, THYROIDAB in the last 72 hours.  Invalid input(s): FREET3  Cardiac Enzymes No results for input(s): CKMB, TROPONINI, MYOGLOBIN in the last 168 hours.  Invalid input(s): CK ------------------------------------------------------------------------------------------------------------------     Component Value Date/Time   BNP 43.3 12/30/2020 0719    Micro Results Recent Results (from the past 240 hour(s))  Gram stain     Status: None   Collection Time: 12/29/20  5:18 PM   Specimen: Peritoneal Washings; Peritoneal Fluid  Result Value Ref Range Status   Specimen Description PERITONEAL  Final   Special Requests NONE  Final   Gram Stain   Final    WBC PRESENT,BOTH PMN AND MONONUCLEAR NO ORGANISMS SEEN CYTOSPIN SMEAR Performed at [facility], [address], [place], [place] [address]    Report Status 12/29/2020 FINAL  Final  Resp Panel by RT-PCR (Flu A&B, Covid) Nasopharyngeal Swab     Status: Abnormal   Collection Time: 12/29/20  7:24 PM   Specimen: Nasopharyngeal Swab; Nasopharyngeal(NP) swabs in vial transport medium  Result Value Ref Range Status   SARS Coronavirus 2 by RT PCR POSITIVE (A) NEGATIVE Final    Comment: RESULT CALLED TO, READ BACK BY AND VERIFIED WITH: Y,PATTERSON @2014  12/29/20 EB (NOTE) SARS-CoV-2 target nucleic acids are DETECTED.  The SARS-CoV-2 RNA is generally detectable in upper respiratory specimens during the acute phase of infection. Positive results are indicative of the presence of the identified virus, but do not rule out bacterial infection or co-infection with other pathogens not detected by the test. Clinical correlation with patient history and other diagnostic information is necessary to determine patient infection status. The expected result is Negative.  Fact Sheet for Patients: EntrepreneurPulse.com.au  Fact Sheet for Healthcare Providers: IncredibleEmployment.be  This test is not yet approved or cleared by the [place] FDA and  has been authorized for detection and/or diagnosis of SARS-CoV-2 by FDA under an Emergency Use Authorization (EUA).  This EUA will remain in effect (meaning this test can be used ) for the duration of  the COVID-19 declaration under Section 564(b)(1) of  the Act, 21 U.S.C. section 360bbb-3(b)(1), unless the authorization is terminated or revoked sooner.     Influenza A by PCR NEGATIVE NEGATIVE Final   Influenza B by PCR NEGATIVE NEGATIVE Final    Comment: (NOTE) The Xpert Xpress SARS-CoV-2/FLU/RSV plus assay is intended as an aid in the diagnosis of influenza from Nasopharyngeal swab specimens and should not be used as a sole basis for treatment. Nasal washings and aspirates are unacceptable for Xpert Xpress SARS-CoV-2/FLU/RSV testing.  Fact Sheet for Patients: EntrepreneurPulse.com.au  Fact Sheet for Healthcare Providers: IncredibleEmployment.be  This test is not yet approved or cleared by the [place] FDA and has been authorized for detection and/or diagnosis of SARS-CoV-2 by FDA under an Emergency Use Authorization (EUA). This EUA will remain in effect (meaning this test can be used) for the duration of the COVID-19 declaration under Section 564(b)(1) of the Act, 21 U.S.C. section 360bbb-3(b)(1), unless the authorization is terminated or revoked.  Performed  at [facility], [place] [address], [place], [place] [address]     Radiology Reports CT Head Wo Contrast  Result Date: 12/23/2020 CLINICAL DATA:  Status post fall. EXAM: CT HEAD WITHOUT CONTRAST TECHNIQUE: Contiguous axial images were obtained from the base of the skull through the vertex without intravenous contrast. COMPARISON:  August 13, 2020 FINDINGS: Brain: There is mild cerebral atrophy with widening of the extra-axial spaces and ventricular dilatation. There are areas of decreased attenuation within the white matter tracts of the supratentorial brain, consistent with microvascular disease changes. Vascular: No hyperdense vessel or unexpected calcification. Skull: Normal. Negative for fracture or focal lesion. Sinuses/Orbits: No acute finding. Other: None. IMPRESSION: 1. Mild cerebral atrophy. 2. No acute intracranial  abnormality. Electronically Signed   By: Virgina Norfolk M.D.   On: 12/23/2020 23:05   CT Cervical Spine Wo Contrast  Result Date: 12/23/2020 CLINICAL DATA:  Status post fall. EXAM: CT CERVICAL SPINE WITHOUT CONTRAST TECHNIQUE: Multidetector CT imaging of the cervical spine was performed without intravenous contrast. Multiplanar CT image reconstructions were also generated. COMPARISON:  July 07, 2020 FINDINGS: Alignment: Approximately 1 mm to 2 mm anterolisthesis of the C4 vertebral body is noted on C5. Approximately 2 mm retrolisthesis of the C5 vertebral body is noted on C6. Skull base and vertebrae: No acute fracture. No primary bone lesion or focal pathologic process. Soft tissues and spinal canal: No prevertebral fluid or swelling. No visible canal hematoma. Disc levels: Moderate severity endplate sclerosis is seen at the levels of C4-C5, C5-C6 and C6-C7. Mild intervertebral disc space narrowing is present at the levels of C2-C3 and C3-C4. Marked severity intervertebral disc space narrowing is seen at the levels of C4-C5, C5-C6 and C6-C7. Bilateral moderate severity multilevel facet joint hypertrophy is noted. Upper chest: Negative. Other: None. IMPRESSION: 1. Marked severity multilevel degenerative changes, as described above. 2. Approximately 1 mm to 2 mm anterolisthesis of the C4 vertebral body on C5. 3. Approximately 2 mm retrolisthesis of the C5 vertebral body on C6. 4. No acute cervical spine fracture. Electronically Signed   By: Virgina Norfolk M.D.   On: 12/23/2020 23:08   CT ABDOMEN PELVIS W CONTRAST  Result Date: 12/30/2020 CLINICAL DATA:  Acute, nonlocalized abdominal pain EXAM: CT ABDOMEN AND PELVIS WITH CONTRAST TECHNIQUE: Multidetector CT imaging of the abdomen and pelvis was performed using the standard protocol following bolus administration of intravenous contrast. CONTRAST:  72m OMNIPAQUE IOHEXOL 300 MG/ML  SOLN COMPARISON:  12/23/2020 FINDINGS: Lower chest: Small left pleural  effusion and loculated right lateral basal pleural effusion are unchanged. Cardiac size within normal limits. Bilateral breast implants are partially visualized. Hepatobiliary: Cirrhosis. No enhancing intrahepatic mass. No intrahepatic or extrahepatic biliary ductal dilation. Cholelithiasis again noted. Pancreas: Unremarkable Spleen: Stable mild splenomegaly Adrenals/Urinary Tract: The adrenal glands are unremarkable. The kidneys are normal in size and position. Previously noted right hydronephrosis has resolved. Stable nonobstructing 13 mm right renal pelvic calculus. No additional intrarenal or ureteral calculi. The bladder is unremarkable. Stomach/Bowel: Moderate stool within the distal colon and rectal vault without evidence of obstruction. Stable marked bowel wall thickening involving the cecum and ascending colon in keeping with changes of portal colopathy, or infectious or inflammatory colitis. The stomach and small bowel are unremarkable. No evidence of obstruction. Large volume ascites is again noted, slightly increased in volume since prior examination. No free intraperitoneal gas. Vascular/Lymphatic: Several small gastroesophageal varices are identified as well as recanalization of the umbilical vein in keeping with changes of portal  venous hypertension. The abdominal vasculature is otherwise unremarkable. Reproductive: Stable simple appearing cysts within the ovaries bilaterally measuring 2.4 cm on the right and 1.7 cm on the left. While stable since prior examination, these are decreased in size when compared to remote prior examination of 07/07/2018 in keeping with a benign etiology. Uterus unremarkable. Other: Diffuse subcutaneous body wall edema again noted in keeping with anasarca. Diffuse body wall wasting again noted. Musculoskeletal: Left hip ORIF noted. Subacute fracture of the left inferior pubic ramus is again noted in addition to remote fracture of this structure slightly anteriorly. Remote  fracture deformities of the a left superior pubic ramus and left iliac crest are again identified. Compression deformities of T12, L1, and L5 are again identified and are stable from remote prior examinations dating as far back as 07/07/2018. No acute bone abnormality. IMPRESSION: Stable bilateral pleural effusions. Cirrhosis with changes of portal venous hypertension with mild splenomegaly, small portosystemic collaterals, and large volume ascites. Ascites has slightly increased in overall volume when compared to prior examination. Marked bowel wall thickening involving the cecum and ascending colon. 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 Cholelithiasis. Bilateral ovarian cysts or better assessed on prior MRI examination of 11/17/2019, and demonstrate interval decrease in size from remote prior examination of 07/07/2018, confirming a benign etiology. Multiple remote fracture deformities of the thoracolumbar spine and left hemipelvis. Superimposed subacute fracture of the left inferior pubic ramus. Electronically Signed   By: Fidela Salisbury MD   On: 12/30/2020 03:22   CT ABDOMEN PELVIS W CONTRAST  Result Date: 12/23/2020 CLINICAL DATA:  Abdominal distension EXAM: CT ABDOMEN AND PELVIS WITH CONTRAST TECHNIQUE: Multidetector CT imaging of the abdomen and pelvis was performed using the standard protocol following bolus administration of intravenous contrast. CONTRAST:  47m OMNIPAQUE IOHEXOL 300 MG/ML  SOLN COMPARISON:  11/02/2020 CT, 09/03/2020, 08/15/2020 FINDINGS: Lower chest: Lung bases demonstrate partially visualized bilateral breast implants. Loculated fluid collection along the right lateral chest measures 8 x 2.7 cm and is unchanged in size. Small left-sided pleural effusion with dependent atelectasis. Hepatobiliary: Cirrhosis of the liver. Multiple calcified gallstones. No biliary dilatation  Pancreas: Unremarkable. No pancreatic ductal dilatation or surrounding inflammatory changes. Spleen: Enlarged, measuring 16 cm craniocaudad. Adrenals/Urinary Tract: Adrenal glands are normal. Kidneys show minimal right hydronephrosis with stable 14 mm right pelvic stone. Subcentimeter hypodensity within the mid left kidney too small to further characterize. The bladder is unremarkable Stomach/Bowel: The stomach is nonenlarged. No dilated small bowel. Worsened diffuse colon wall thickening, most evident involving the right colon, transverse colon and descending colon. Vascular/Lymphatic: Nonaneurysmal aorta. Subcentimeter retroperitoneal nodes. Small gastroesophageal and perirectal varices. Recanalized paraumbilical vein. Reproductive: Uterus unremarkable. Stable 2.1 cm right adnexal cyst and 1.3 cm left adnexal cyst. Other: No free air. Moderate to large volume of ascites within the abdomen and pelvis. Generalized body wall edema with subcutaneous fluid. Musculoskeletal: Chronic compression deformity of L5 and at the superior endplates of TI09and L1. Chronic fracture deformity of the sacrum. Old fracture deformity of the left iliac bone. Chronic fracture deformity of left inferior pubic ramus. Acute to subacute fracture deformity of left inferior pubic ramus slightly more posterior to the chronic deformity. IMPRESSION: 1. 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 Moderate to large volume of ascites within the abdomen and pelvis. Similar small left-sided pleural effusion. Generalized subcutaneous fluid and edema consistent with anasarca. 2. Slight worsening of  diffuse colon wall thickening which could be due to portal colopathy although colitis could also produce this appearance. 3. Multiple chronic fracture deformities. There is an acute to subacute appearing fracture involving the left inferior pubic ramus posterior to a chronic fracture deformity. 4. Multiple gallstones 5. Stable right renal  pelvis calcification with minimal right hydronephrosis. 6. Stable bilateral ovarian cysts measuring up to 2.1 cm. No follow-up imaging recommended. Note: This recommendation does not apply to premenarchal patients and to those with increased risk (genetic, family history, elevated tumor markers or other high-risk factors) of ovarian cancer. Reference: JACR 2020 Feb; 17(2):248-254 Electronically Signed   By: Donavan Foil M.D.   On: 12/23/2020 23:14   XR HIP UNILAT W OR W/O PELVIS 2-3 VIEWS LEFT  Result Date: 12/04/2020 AP pelvis lateral left hip obtained demonstrating a prior hip nailing in good position.  No evidence of a hip fracture.  There are some fracture line visible about the greater trochanter but they look old.  There is also a prior superior and inferior pubic rami fracture on the left.  Compared to films performed previously there did not appear to be any change.  There was some bulky changes about the iliac crest on the left but are similar to the prior films.  I do not see any acute changes on today's films  XR Ankle 2 Views Left  Result Date: 12/04/2020 Films of the right ankle obtained in 2 projections no acute changes.  Diffuse osteopenia.  Prior os calcis fracture appears to have healed with some collapse but there is no pain.  No soft tissue swelling  IR Paracentesis  Result Date: 12/18/2020 INDICATION: Recurrent ascites EXAM: ULTRASOUND GUIDED therapeutic PARACENTESIS MEDICATIONS: 10 mL 1% lidocaine COMPLICATIONS: None immediate. PROCEDURE: Informed written consent was obtained from the patient after a discussion of the risks, benefits and alternatives to treatment. A timeout was performed prior to the initiation of the procedure. Initial ultrasound scanning demonstrates a large amount of ascites within the left lower abdominal quadrant. The left lower abdomen was prepped and draped in the usual sterile fashion. 1% lidocaine was used for local anesthesia. Following this, a 19 gauge,  7-cm, Yueh catheter was introduced. An ultrasound image was saved for documentation purposes. The paracentesis was performed. The catheter was removed and a dressing was applied. The patient tolerated the procedure well without immediate post procedural complication. Patient received post-procedure intravenous albumin; see nursing notes for details. FINDINGS: A total of approximately 6.2 L of clear yellow fluid was removed. Samples were not sent to the laboratory as requested by the clinical team. IMPRESSION: Successful ultrasound-guided paracentesis yielding 6.2 liters of peritoneal fluid. Read by: Durenda Guthrie, PA-C Electronically Signed   By: Jacqulynn Cadet M.D.   On: 12/18/2020 11:23   IR Paracentesis  Result Date: 12/12/2020 INDICATION: Patient with history of NASH cirrhosis, recurrent ascites; request received for therapeutic paracentesis up to 10 liters. EXAM: ULTRASOUND GUIDED THERAPEUTIC  PARACENTESIS MEDICATIONS: 1% lidocaine to skin/ subcutaneous tissue COMPLICATIONS: None immediate. PROCEDURE: Informed written consent was obtained from the patient after a discussion of the risks, benefits and alternatives to treatment. A timeout was performed prior to the initiation of the procedure. Initial ultrasound scanning demonstrates a large amount of ascites within the left mid to lower abdominal quadrant. The left mid to lower abdomen was prepped and draped in the usual sterile fashion. 1% lidocaine was used for local anesthesia. Following this, a 19 gauge, 7-cm, Yueh catheter was introduced. An ultrasound image was  saved for documentation purposes. The paracentesis was performed. The catheter was removed and a dressing was applied. The patient tolerated the procedure well without immediate post procedural complication. Patient received post-procedure intravenous albumin; see nursing notes for details. FINDINGS: A total of approximately 5.2 liters of yellow fluid was removed. IMPRESSION: Successful  ultrasound-guided therapeutic paracentesis yielding 5.2 liters of peritoneal fluid. Read by: Nash Mantis Electronically Signed   By: Corrie Mckusick D.O.   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 COMPLICATIONS: None immediate. PROCEDURE: Informed written consent was obtained from the patient after a discussion of the risks, benefits and alternatives to treatment. A timeout was performed prior to the initiation of the procedure. Initial ultrasound scanning demonstrates a large amount of ascites within the left lower abdominal quadrant. The left lower abdomen was prepped and draped in the usual sterile fashion. 1% lidocaine was used for local anesthesia. Following this, a 19 gauge, 7-cm, Yueh catheter was introduced. An ultrasound image was saved for documentation purposes. The paracentesis was performed. The catheter was removed and a dressing was applied. The patient tolerated the procedure well without immediate post procedural complication. Patient received post-procedure intravenous albumin; see nursing notes for details. FINDINGS: A total of approximately 8 liters of clear yellow fluid was removed. IMPRESSION: Successful ultrasound-guided paracentesis yielding 8 liters of peritoneal fluid. Read by: Durenda Guthrie, PA-C Electronically Signed   By: Jacqulynn Cadet M.D.   On: 12/05/2020 14:20

## 2020-12-30 NOTE — Evaluation (Signed)
Clinical/Bedside Swallow Evaluation Patient Details  Name: Megan Roberson MRN: [id number] Date of Birth: [birth year]  Today's Date: 12/30/2020 Time: SLP Start Time (ACUTE ONLY): 71 SLP Stop Time (ACUTE ONLY): 1040 SLP Time Calculation (min) (ACUTE ONLY): 20 min  Past Medical History:  Past Medical History:  Diagnosis Date  . Anxiety and depression   . Cholelithiasis without cholecystitis   . Chronic abdominal pain   . Chronic nausea    + abd pain  . Cirrhosis of liver with ascites (HCC)    Alc cirrhosis; hx of multiple abd paracentesis, +esoph varices (+banding), pancytopenia, hx of hepatic enceph, hx of hydrothorax  . Esophageal candidiasis ([place]) 11/2020   Dr. Noreene Larsson (difluc contraind b/c prolonged QT)  . Heart murmur    Echo 10/06/17 [facility]): Normal LV/RV size, basal septal hypertophy-normal variant, LVEF 65-70%, normal LV/RV function, est RAP 5 mmHg, no sign valvular stenosis or regurg--trace MR/TR/PR  . History of abnormal cervical Pap smear   . History of pneumonia   . Hypothyroidism   . Insomnia   . Nephrolithiasis    right renal pelvis, 13-49m nonobst stone  . Neuropathy    unclear hx  . Osteoarthritis, multiple sites    primarily hands and knees  . Osteoporosis 02/09/2018  . Ovarian cyst    bilat.  Stable on MRI pelvis 11/2019 and CT abd/pelv 09/2020--no f/u imaging indicated.  . [place]   . Prolonged QT interval   . Splenomegaly   . [place]rombocytopenia ([place] 12/01/2019   severe, hx of plts down into 30s   Past Surgical History:  Past Surgical History:  Procedure Laterality Date  . AUGMENTATION MAMMAPLASTY Bilateral    20 years ago  . BIOPSY  03/15/2018   gastric.  Procedure: BIOPSY;  Surgeon: AYetta Flock MD;  [facility];  Service: Gastroenterology;;  Gastric  . BIOPSY  11/14/2020   Procedure: BIOPSY;  Surgeon: AYetta Flock MD;  [facility];  Service: Gastroenterology;;  . COLONOSCOPY  2019  .  COLONOSCOPY WITH ESOPHAGOGASTRODUODENOSCOPY (EGD) AND ESOPHAGEAL DILATION (ED)    . COLONOSCOPY WITH PROPOFOL N/A 03/15/2018   Redundant colon, severe looping, very edematous colon c/w portal HTN.  Procedure: COLONOSCOPY WITH PROPOFOL;  Surgeon: AYetta Flock MD;  [facility];  Service: Gastroenterology;  Laterality: N/A;  . ESOPHAGOGASTRODUODENOSCOPY  11/14/2020   esoph candidiasis, nonbleeding ulcer (path benign), portal hypertensive gastropathy.  No varices.  . ESOPHAGOGASTRODUODENOSCOPY (EGD) WITH PROPOFOL N/A 03/15/2018   Gastritis, small gastric ulcer. Procedure: ESOPHAGOGASTRODUODENOSCOPY (EGD) WITH PROPOFOL;  Surgeon: AYetta Flock MD;  [facility];  Service: Gastroenterology;  Laterality: N/A;  . ESOPHAGOGASTRODUODENOSCOPY (EGD) WITH PROPOFOL N/A 11/14/2020   Procedure: ESOPHAGOGASTRODUODENOSCOPY (EGD) WITH PROPOFOL;  Surgeon: AYetta Flock MD;  [facility];  Service: Gastroenterology;  Laterality: N/A;  . ESOPHAGOGASTRODUODENOSCOPY W/ BANDING     Varices  . FRACTURE SURGERY Left    x 2 L elbow and hip left   . HARDWARE REMOVAL Left 09/17/2017   Procedure: HARDWARE REMOVAL LEFT OLECRANON;  Surgeon: [address]amese [address] MD;  [place] [facility]  Service: Orthopedics;  Laterality: Left;  . IR PARACENTESIS  11/18/2017  . IR PARACENTESIS  11/26/2017  . IR PARACENTESIS  03/14/2018  . IR PARACENTESIS  08/08/2020  . IR PARACENTESIS  09/23/2020  . IR PARACENTESIS  10/22/2020  . IR PARACENTESIS  10/30/2020  . IR PARACENTESIS  11/12/2020  . IR PARACENTESIS  11/20/2020  . IR PARACENTESIS  11/27/2020  . IR PARACENTESIS  12/05/2020  .  IR PARACENTESIS  12/12/2020  . IR PARACENTESIS  12/18/2020  . KNEE SURGERY Right    open incision for ligaments  . ORIF ELBOW FRACTURE Left 04/30/2017   Procedure: REVISION OPEN REDUCTION INTERNAL FIXATION (ORIF) ELBOW/OLECRANON FRACTURE;  Surgeon: Altamese Trappe, MD;  Location: [place];  Service: Orthopedics;  Laterality:  Left;  . ORIF HIP FRACTURE Left   . REPAIR EXTENSOR TENDON Left 12/29/2019   Procedure: LEFT LONG AND LEFT RING FRINGER EXTENSOR REALIGNAMENT WITH POSSIBLE TENDON TRANSFER;  Surgeon: Charlotte Crumb, MD;  Location: [place];  Service: Orthopedics;  Laterality: Left;  . TRANSTHORACIC ECHOCARDIOGRAM  05/19/2020   extremely poor acoustic window/technically poor study-->normal  . virtual colonoscopy  04/24/2019   no polyps, no mass, no apple core lesion, no stricture   HPI:  Pt is a 65 y.o. 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 Pt presented with altered mentation; pt was found to have COVID and diagnosed with Acute hepatic encephalopathy. CT abdomen: Bilateral ovarian cysts or better assessed on prior MRI examination of 11/17/2019, and demonstrate interval decrease in size from remote prior examination of 07/07/2018, confirming a benign etiology. Per RN note on 2/28, pt required multiple sips of water to swallow pills and exhibited coughing thereafter. 40 minutes was required for pill administration. Coughing and subsequent emesis were noted with pills in applesauce. MD made aware and order for swallow evaluation was placed.   Assessment / Plan / Recommendation Clinical Impression  Pt was seen for bedside swallow evaluation. She stated that she does have some difficulty swallowing which she described as "food sticking" (pt pointed to the sternal notch), but she denied any frequent signs of aspiration at baseline. Pt reported at the end of the evaluation that "it's not usually this bad". Oral mechanism exam was limited due to pt's difficulty following commands; however, oral motor  strength and ROM appeared grossly WFL. Dentition was adequate with maxillary dentures and natural, mandibular teeth. She demonstrated symptoms of oropharyngeal dysphagia characterized by reduced bolus awareness, oral holding, multiple swallows suggesting pharyngeal residue, and signs of aspiration with all solids and liquid consistencies. It is recommended that her NPO status be maintained and that provision of meds be restricted to only those that are critical. A modified barium swallow study is recommended to further assess swallow function and it is scheduled for today at 1400. SLP Visit Diagnosis: Dysphagia, unspecified (R13.10)    Aspiration Risk  Moderate aspiration risk    Diet Recommendation  (Defer until MBS completed)   Medication Administration: Crushed with puree    Other  Recommendations Oral Care Recommendations: Oral care QID   Follow up Recommendations  (TBD)      Frequency and Duration min 2x/week  2 weeks       Prognosis Prognosis for Safe Diet Advancement: Good Barriers to Reach Goals: Cognitive deficits      Swallow Study   General Date of Onset: 12/30/20 HPI: Pt is a 65 y.o. 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 Pt presented with altered mentation; pt was found to  have COVID and diagnosed with Acute hepatic encephalopathy. CT abdomen: Bilateral ovarian cysts or better assessed on prior MRI examination of 11/17/2019, and demonstrate interval decrease in size from remote prior examination of 07/07/2018, confirming a benign etiology. Per RN note on 2/28, pt required multiple sips of water to swallow pills and exhibited coughing thereafter. 40  minutes was required for pill administration. Coughing and subsequent emesis were noted with pills in applesauce. MD made aware and order for swallow evaluation was placed. Type of Study: Bedside Swallow Evaluation Previous Swallow Assessment: None Diet Prior to this Study: NPO Temperature Spikes Noted: No Respiratory Status: Room air History of Recent Intubation: No Behavior/Cognition: Cooperative;Pleasant mood;Confused Oral Cavity Assessment: Within Functional Limits Oral Care Completed by SLP: No Oral Cavity - Dentition: Dentures, top;Adequate natural dentition Vision: Functional for self-feeding Self-Feeding Abilities: Needs assist Patient Positioning: Upright in bed;Postural control adequate for testing Baseline Vocal Quality:  (strained) Volitional Cough: Weak Volitional Swallow: Able to elicit    Oral/Motor/Sensory Function Overall Oral Motor/Sensory Function: Within functional limits   Ice Chips Ice chips: Impaired Presentation: Spoon Oral Phase Functional Implications: Oral holding   Thin Liquid Thin Liquid: Impaired Presentation: Cup;Straw Oral Phase Impairments: Poor awareness of bolus Oral Phase Functional Implications: Oral holding Pharyngeal  Phase Impairments: Cough - Immediate;Cough - Delayed    Nectar Thick Nectar Thick Liquid: Impaired Presentation: Straw Oral Phase Impairments: Poor awareness of bolus Oral phase functional implications: Prolonged oral transit;Oral holding Pharyngeal Phase Impairments: Cough - Delayed;Throat Clearing - Immediate   Honey Thick Honey Thick Liquid: Not tested   Puree Puree: Impaired Presentation: Spoon Oral Phase Impairments: Poor awareness of bolus Oral Phase Functional Implications: Oral holding Pharyngeal Phase Impairments: Multiple swallows;Throat Clearing - Immediate;Cough - Delayed   Solid    Jet [place] I. Hardin Negus, [place], [place] Office number [phone] Pager [phone]  Solid: Not  tested      Horton Marshall 12/30/2020,10:58 AM

## 2020-12-30 NOTE — Progress Notes (Signed)
Modified Barium Swallow Progress Note  Patient Details  Name: Megan Roberson MRN: [id number] Date of Birth: 25-Mar-1956  Today's Date: 12/30/2020  Modified Barium Swallow completed.  Full report located under Chart Review in the Imaging Section.  Brief recommendations include the following:  Clinical Impression  Pt presents with oropharyngeal dyspahgia characterized by impaired bolus awareness, prolonged mastication, impaired posterior bolus propulsion, piecemeal deglutition, and an intermittent pharyngeal delay. She demonstrated oral holding accross consistencies, but this was less significant with solids which required mastication. Lingual pumping was noted during attempts at posterior bolus propulsion and cueing was needed to swallow some boluses. A posterior head tilt was frequently observed with continued lingual pumping. Aspiration (PAS 7) was noted once with thin liquids which were released to pharynx following prolonged oral holding with a posterior head tilt. These liquids pooled in the pyriform sinuses and aspiration was secondary to spillover from the pyriform sinuses during deglutition. Considering these results combined with her presentation at bedside, a dysphagia 2 diet with nectar thick liquids is recommended at this time. SLP will follow for dysphagia treatment and for diet advancement as clinically indicated.   Swallow Evaluation Recommendations       SLP Diet Recommendations: Dysphagia 2 (Fine chop) solids;Nectar thick liquid   Liquid Administration via: Cup;Straw   Medication Administration: Crushed with puree   Supervision: Staff to assist with self feeding   Compensations: Slow rate;Small sips/bites   Postural Changes: Seated upright at 90 degrees   Oral Care Recommendations: Oral care BID   Other Recommendations: Order thickener from [place] I. Hardin Negus, Megan Roberson, Megan Roberson Office number [phone] Pager  [phone]  Horton Marshall 12/30/2020,3:41 PM

## 2020-12-30 NOTE — Consult Note (Signed)
WOC Nurse Consult Note: Patient receiving care in [facility] [phone]. Patient is COVID +. Primary RN, Joelene Millin, informed me of all the skin/wound issues for which the [facility] [address] team was consulted.   Reason for Consult: multiple open areas Wound type: The patient's skin is so fragile that placing a BP cuff on the arm, and with inflation, it rips the skin. Also, she has multiple skin tears across the extremities.  She has liver impairment. There is also a slough filled wound on the LLE. Pressure Injury POA: Yes/No/NA Measurement: To be provided by the bedside RN in the flowsheet section Wound bed: LLE, slough filled Drainage (amount, consistency, odor)  Periwound: VERY FRAGILE Dressing procedure/placement/frequency: FOR ALL SKIN TEARS, apply vaseline gauze or Xeroform gauzes over the tears, secure with kerlex. Do NOT place any type of tape on the skin.  MOISTEN old dressings with saline prior to attempting to remove.  The patient's skin is VERY FRAGILE.  Use skin adhesive removers for all dressings with adhesives. Kellie Simmering #[id number]--VQQUI are to be requested by the [place].  BID saline moistened gauze to the LLE wound with slough.  I have instructed Joelene Millin, RN, to tell the provider about the damage done by the BP cuff.  Discussed plan of care with the bedside nurse.  [place] nurse will not follow at this time.  Please re-consult the [place] team if needed.  Val Riles, RN, MSN, CWOCN, CNS-BC, pager [phone]

## 2020-12-30 NOTE — Progress Notes (Signed)
New Admission Note:   Arrival Method: Via stretcher from the ED Mental Orientation:  A & O to self only - Calm but cooperative Telemetry:   Placed on Tele #[id number] - NSR Assessment: Completed Skin: See skin assessment IV:  See Assessment Pain: Denies but whimpers when skin is touched or when she is turned Tubes:  None Safety Measures: Safety Fall Prevention Plan has been given, discussed and signed Admission: Completed 5 MW Orientation: Patient has been orientated to the room, unit and staff.  Family:  No family at bedside - From home with husband  Patient's clothing is in the closet.  Unable to do admission history.  Her skin is very fragile and friable.  Multiple areas when skin has peeled away.  Blood pressure cuff on left arm caused skin to tear - applied vasiline gauze, kerlix, and paper tape.  Multiple areas to bilateral arms and left leg that have dressing applied with hypofix tape.  Unable to few because patient is crying with pain when we attempt to remove tape.  The left leg wound had a crated wound with yellow slough on the area I could see.  [place] nurse on the floor and I was able to address and obtain recommendations.  Please see WOC note.   Orders have been reviewed and implemented. Will continue to monitor the patient. Call light has been placed within reach and bed alarm has been activated.   Earleen Reaper RN- BC, [facility] Phone number: [phone]

## 2020-12-30 NOTE — Progress Notes (Signed)
   12/30/20 0349  Provider Notification  Provider Name/Title Dr. Tonie Griffith  Date Provider Notified 12/30/20  Time Provider Notified [phone]  Notification Type Page  Notification Reason Other (Comment) (CT results are now in the computer)  Provider response See new orders  Date of Provider Response 12/30/20   Results for CT Abdomen received.  Dr. Tonie Griffith made aware.  Patient made NPO and ST Swallowing evaluation ordered.  Soaps Suds Enema placed on hold.  Will continue to monitor patient.  Earleen Reaper RN

## 2020-12-30 NOTE — Progress Notes (Signed)
PT Cancellation Note  Patient Details Name: Megan Roberson MRN: [id number] DOB: 04/28/1956   Cancelled Treatment:    Reason Eval/Treat Not Completed: Patient at procedure or test/unavailable per OT, currently off unit at procedure and unavailable for PT. Will attempt to return if time/schedule allow.    Windell Norfolk, DPT, PN1   Supplemental Physical Therapist [facility]    Pager [phone] Acute Rehab Office [phone]

## 2020-12-30 NOTE — Telephone Encounter (Signed)
Noted. Pt has been admitted to hosp for hepatic encephalopathy.

## 2020-12-30 NOTE — Telephone Encounter (Signed)
FYI, Pt currently admitted at [facility].  [place] Day - Client TELEPHONE ADVICE RECORD AccessNurse Patient Name: Megan Roberson Gender: Female DOB: [birth year] Age: 65 Y 42 M 11 D Return Phone Number: [phone] (Primary) Address: City/State/Zip: [place] [address] Client [place] Day - Client Client Site [place] - Day Physician Crissie Sickles - MD Contact Type Call Who Is Calling Patient / Member / Family / Caregiver Call Type Triage / Clinical Caller Name Teighan Aubert Relationship To Patient Spouse Return Phone Number [phone] (Primary) Chief Complaint DIFFICULTY TO AWAKEN in anyone Reason for Call Symptomatic / Request for [place] says his wife has an acute case of liver disease. Pt slept good last night but pt cannot get her to wake up this morning. Pt eyes keep rolling to the back of her head. She is not very coherent. Pt will not eat or drink. Pt did not know who her husband. She is breathing okay. Husband is trying to hold it together but is scared. Translation No Nurse Assessment Nurse: Verita Schneiders, RN, April Date/Time Eilene Ghazi Time): 12/29/2020 12:37:32 PM Confirm and document reason for call. If symptomatic, describe symptoms. ---Caller says his wife has an acute case of liver disease. Pt slept good last night but pt cannot get her to wake up this morning. Pt eyes keep rolling to the back of her head. She is not very coherent. Pt will not eat or drink. Pt did not know who her husband. She is breathing okay. Husband is trying to hold it together but is scared. When she did get her somewhat awake, she did not recognize him/not aware/rolling eyes/distant stare/ mumbling. S/sx started around 10AM-- when he woke her up to get her medications and food. Does the patient have any new or worsening symptoms? ---Yes Will a triage be completed? ---Yes Related visit to  physician within the last 2 weeks? ---Yes Does the PT have any chronic conditions? (i.e. diabetes, asthma, this includes High risk factors for pregnancy, etc.) ---Yes List chronic conditions. ---liver disease, a lot of ascites in abdomen Is this a behavioral health or substance abuse call? ---No PLEASE NOTE: All timestamps contained within this report are represented as [place] Standard Time. CONFIDENTIALTY NOTICE: This fax transmission is intended only for the addressee. It contains information that is legally privileged, confidential or otherwise protected from use or disclosure. If you are not the intended recipient, you are strictly prohibited from reviewing, disclosing, copying using or disseminating any of this information or taking any action in reliance on or regarding this information. If you have received this fax in error, please notify us immediately by telephone so that we can arrange for its return to [place]. Phone: [phone], Toll-Free: [phone], Fax: [phone] Page: 2 of 2 Call Id: [id number] Guidelines Guideline Title Affirmed Question Affirmed Notes Nurse Date/Time Eilene Ghazi Time) Neurologic Deficit [1] SEVERE weakness (i.e., unable to walk or barely able to walk, requires support) AND [2] new-onset or worsening Beams, RN, April 12/29/2020 12:39:56 PM Disp. Time Eilene Ghazi Time) Disposition Final User 12/29/2020 12:36:08 PM Send to Urgent Darlyn Read 12/29/2020 12:46:20 PM 911 Outcome Documentation Beams, RN, April Reason: Taking in car. 12/29/2020 12:40:39 PM Call EMS 911 Now Yes Beams, RN, April Caller Disagree/Comply Comply Caller Understands Yes PreDisposition InappropriateToAsk Care Advice Given Per Guideline CALL EMS 911 NOW: * Immediate medical attention is needed. You need to hang up and call 911 (or an ambulance). CARE ADVICE  given per Neurologic Deficit (Adult) guideline. * Triager Discretion: I'll call you back in a few minutes to be sure you  were able to reach them. Comments User: April, Beams, RN Date/Time Eilene Ghazi Time): 12/29/2020 12:48:20 PM Her sister is coming to help him take her to [facility] ED

## 2020-12-30 NOTE — Progress Notes (Signed)
   12/30/20 0054  Provider Notification  Provider Name/Title Dr. Tonie Griffith  Date Provider Notified 12/30/20  Time Provider Notified [phone]  Notification Type Page  Notification Reason Change in status  Provider response See new orders  Date of Provider Response 12/30/20   Attempted to give patient her PO medications.  She is poor historian.  Alert to self only.  When asked, she stated she had no problems swallowing medications.  Attempted one pill and patient had to take multiple sips of water.  With the 2nd and 3rd smaller tablets, she began to cough after each attempt.  Attempted to given medications whole with applesauce.  Still coughing and multiple swallowing attempts.  It took approximately 40 minutes to give.  She then became nauseated and threw the medications up.  At that time, no medication ordered for nausea.  Patient also due to receive soap suds enema.  She is whimpering when slightly touched.  Dr. Tonie Griffith called and made aware.  Soap Suds enema held.  Order for CT Abd received.  Order for IV Zofran received and given.  Will continue to monitor patient.  Earleen Reaper RN

## 2020-12-30 NOTE — Plan of Care (Signed)
  Problem: Education: Goal: Knowledge of risk factors and measures for prevention of condition will improve Outcome: Progressing

## 2020-12-30 NOTE — Progress Notes (Signed)
Pt arrived to unit. AOx3. RA. Denies pain. VS WDL. No belongings at bedside. Call bell placed within reach. Pt voiced understanding of use. Bed alarm activated.

## 2020-12-31 ENCOUNTER — Inpatient Hospital Stay (HOSPITAL_COMMUNITY): Payer: Medicare HMO

## 2020-12-31 ENCOUNTER — Other Ambulatory Visit: Payer: Self-pay

## 2020-12-31 DIAGNOSIS — K7460 Unspecified cirrhosis of liver: Secondary | ICD-10-CM

## 2020-12-31 DIAGNOSIS — I8500 Esophageal varices without bleeding: Secondary | ICD-10-CM

## 2020-12-31 DIAGNOSIS — D649 Anemia, unspecified: Secondary | ICD-10-CM

## 2020-12-31 HISTORY — PX: IR PARACENTESIS: IMG2679

## 2020-12-31 LAB — CBC WITH DIFFERENTIAL/PLATELET
Abs Immature Granulocytes: 0.03 10*3/uL (ref 0.00–0.07)
Basophils Absolute: 0 10*3/uL (ref 0.0–0.1)
Basophils Relative: 0 %
Eosinophils Absolute: 0 10*3/uL (ref 0.0–0.5)
Eosinophils Relative: 0 %
HCT: 23.7 % — ABNORMAL LOW (ref 36.0–46.0)
Hemoglobin: 7.2 g/dL — ABNORMAL LOW (ref 12.0–15.0)
Immature Granulocytes: 1 %
Lymphocytes Relative: 11 %
Lymphs Abs: 0.6 10*3/uL — ABNORMAL LOW (ref 0.7–4.0)
MCH: 27.1 pg (ref 26.0–34.0)
MCHC: 30.4 g/dL (ref 30.0–36.0)
MCV: 89.1 fL (ref 80.0–100.0)
Monocytes Absolute: 0.8 10*3/uL (ref 0.1–1.0)
Monocytes Relative: 15 %
Neutro Abs: 3.9 10*3/uL (ref 1.7–7.7)
Neutrophils Relative %: 73 %
Platelets: 40 10*3/uL — ABNORMAL LOW (ref 150–400)
RBC: 2.66 MIL/uL — ABNORMAL LOW (ref 3.87–5.11)
RDW: 21.9 % — ABNORMAL HIGH (ref 11.5–15.5)
WBC: 5.4 10*3/uL (ref 4.0–10.5)
nRBC: 0 % (ref 0.0–0.2)

## 2020-12-31 LAB — COMPREHENSIVE METABOLIC PANEL
ALT: 18 U/L (ref 0–44)
AST: 38 U/L (ref 15–41)
Albumin: 3.2 g/dL — ABNORMAL LOW (ref 3.5–5.0)
Alkaline Phosphatase: 96 U/L (ref 38–126)
Anion gap: 9 (ref 5–15)
BUN: 11 mg/dL (ref 8–23)
CO2: 21 mmol/L — ABNORMAL LOW (ref 22–32)
Calcium: 8.9 mg/dL (ref 8.9–10.3)
Chloride: 106 mmol/L (ref 98–111)
Creatinine, Ser: 0.76 mg/dL (ref 0.44–1.00)
GFR, Estimated: >60 mL/min (ref >60)
Glucose, Bld: 89 mg/dL (ref 70–99)
Potassium: 4.2 mmol/L (ref 3.5–5.1)
Sodium: 136 mmol/L (ref 135–145)
Total Bilirubin: 4.5 mg/dL — ABNORMAL HIGH (ref 0.3–1.2)
Total Protein: 5.6 g/dL — ABNORMAL LOW (ref 6.5–8.1)

## 2020-12-31 LAB — BRAIN NATRIURETIC PEPTIDE: B Natriuretic Peptide: 120.9 pg/mL — ABNORMAL HIGH (ref 0.0–100.0)

## 2020-12-31 LAB — MAGNESIUM: Magnesium: 2 mg/dL (ref 1.7–2.4)

## 2020-12-31 LAB — AMMONIA: Ammonia: 60 umol/L — ABNORMAL HIGH (ref 9–35)

## 2020-12-31 LAB — C-REACTIVE PROTEIN: CRP: 2.9 mg/dL — ABNORMAL HIGH (ref <1.0)

## 2020-12-31 MED ORDER — LIDOCAINE HCL 1 % IJ SOLN
INTRAMUSCULAR | Status: AC | PRN
Start: 2020-12-31 — End: 2020-12-31
  Administered 2020-12-31: 20 mL via INTRADERMAL

## 2020-12-31 MED ORDER — OXYCODONE HCL 5 MG PO TABS
5.0000 mg | ORAL_TABLET | Freq: Once | ORAL | Status: AC
  Administered 2020-12-31: 5 mg via ORAL
  Filled 2020-12-31: qty 1

## 2020-12-31 MED ORDER — PANTOPRAZOLE SODIUM 40 MG PO TBEC
40.0000 mg | DELAYED_RELEASE_TABLET | Freq: Two times a day (BID) | ORAL | Status: DC
  Administered 2020-12-31 – 2021-01-03 (×6): 40 mg via ORAL
  Filled 2020-12-31 (×6): qty 1

## 2020-12-31 MED ORDER — OXYCODONE HCL 5 MG PO TABS
10.0000 mg | ORAL_TABLET | Freq: Three times a day (TID) | ORAL | Status: DC | PRN
  Administered 2021-01-01 – 2021-01-03 (×7): 10 mg via ORAL
  Filled 2020-12-31 (×8): qty 2

## 2020-12-31 MED ORDER — BOOST PLUS PO LIQD
237.0000 mL | Freq: Three times a day (TID) | ORAL | Status: DC
  Administered 2020-12-31 – 2021-01-01 (×4): 237 mL via ORAL
  Filled 2020-12-31 (×12): qty 237

## 2020-12-31 MED ORDER — LIDOCAINE HCL 1 % IJ SOLN
INTRAMUSCULAR | Status: AC
  Filled 2020-12-31: qty 20

## 2020-12-31 NOTE — Progress Notes (Signed)
EGD on 02-20-21 with [facility] at [facility] for cirrhosis. esoph varices, anemia. Will need Covid test on Monday, 4-18

## 2020-12-31 NOTE — Progress Notes (Signed)
  Speech Language Pathology Treatment: Dysphagia  Patient Details Name: Megan Roberson MRN: [id number] DOB: 1956-04-01 Today's Date: 12/31/2020 Time: 1000-1013 SLP Time Calculation (min) (ACUTE ONLY): 13 min  Assessment / Plan / Recommendation Clinical Impression  SLP followed up for education following MBSS and diet analysis. Educated pt on aspiration evidenced on MBSS with thins and rationale for current diet. Provided oral care with improvements in oral hygiene. Pt with loosely fitting upper dentures, would benefit from denture adhesives. Prolonged mastication noted with softer solids, pt reports finely chopped is easier to chew at this time. Assessed with thin liquid water by cup. Pt continues to exhibit some oral holding with suspected significantly delayed swallow initiation per palpation. This was consistent with all POs including nectar thick, ice chips, and softer solids. No overt s/sx of aspiration with any POs this date, however PO trials were limited as pt exhibited fatigue. Continue current diet with additional diet analysis for possible diet advancement as pt tolerates.    HPI HPI: Pt is a 65 y.o. 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 Pt presented with altered mentation; pt was found to have COVID and diagnosed with Acute hepatic encephalopathy. CT abdomen: Bilateral ovarian cysts or better assessed on prior MRI examination of 11/17/2019, and demonstrate interval decrease in size from remote prior examination of 07/07/2018, confirming a benign etiology. Per RN note on 2/28, pt required multiple sips of water to swallow pills and exhibited coughing thereafter. 40  minutes was required for pill administration. Coughing and subsequent emesis were noted with pills in applesauce. MD made aware and order for swallow evaluation was placed.      SLP Plan  Continue with current plan of care       Recommendations  Diet recommendations: Dysphagia 2 (fine chop);Nectar-thick liquid;Other(comment) (pt okay for water in between meals following diligent oral care) Liquids provided via: Cup Medication Administration: Crushed with puree Supervision: Staff to assist with self feeding;Full supervision/cueing for compensatory strategies Compensations: Slow rate;Small sips/bites;Minimize environmental distractions Postural Changes and/or Swallow Maneuvers: Seated upright 90 degrees;Upright 30-60 min after meal                Oral Care Recommendations: Oral care BID;Oral care prior to ice chip/H20 Follow up Recommendations: Other (comment) (TBD) SLP Visit Diagnosis: Dysphagia, oropharyngeal phase (R13.12) Plan: Continue with current plan of care       [place], [place]   12/31/2020, 10:24 AM

## 2020-12-31 NOTE — Progress Notes (Signed)
PROGRESS NOTE                                                                                                                                                                                                             Patient Demographics:    Megan Roberson, is a 65 y.o. female, DOB - 11-05-1955, EGB:[id number]  Outpatient Primary MD for the patient is McGowen, Adrian Blackwater, MD    LOS - 1  Admit date - 12/29/2020    Chief Complaint  Patient presents with  . Altered Mental Status       Brief Narrative (HPI from H&P) - Megan Roberson is a 65 y.o. 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   Subjective:   Patient in bed appears to be in no distress, she denies any headache pain, no shortness of breath, does have some chronic generalized abdominal discomfort.  No focal weakness.   Assessment  & Plan :      1. Acute hepatic encephalopathy noncompliance with lactulose - she has been counseled on compliance with lactulose, has been placed on lactulose and Xifaxan, mentation has improved, PT-OT and speech evaluation.  2.  Incidental COVID-19 infection.  Incidental continue to monitor.  3.  Hypertension and recurrent ascites.  Requires weekly paracentesis for therapeutic purposes, lactulose and Xifaxan as above, therapeutic paracentesis ordered on 12/30/2020 with albumin.  Long-term prognosis appears poor involve palliative care.  4.  Chronic iron deficiency  anemia and anemia of disease + chronic thrombocytopenia due to NASH.  Type screen done, PPI, no signs of active bleeding, monitor.  5.  Multiple remote fracture deformities of the thoracolumbar spine and left hemipelvis. Superimposed subacute fracture of the left inferior pubic ramus.  PT-OT, weightbearing as tolerated and monitor.  Currently relatively symptom-free.  6. Chronic abdominal pain.  Supportive care.   7.  Possible right-sided loculated pleural effusion.  Asymptomatic outpatient pulmonary follow-up.   8.  Severe protein calorie malnutrition and cachexia.  Supportive care.  Prognosis appears extremely  poor.  Placed on protein supplements       Condition -  Guarded  Family Communication  : Husband  [name] [phone] (? Wrong #) & [phone] on 12/30/2020 - 11.04 am no response.  Code Status : Full  Consults  :  IR  PUD Prophylaxis : PPI   Procedures  :      CT -  Stable bilateral pleural effusions. Cirrhosis with changes of portal venous hypertension with mild splenomegaly, small portosystemic collaterals, and large volume ascites. Ascites has slightly increased in overall volume when compared to prior examination. Marked bowel wall thickening involving the cecum and ascending colon. 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 Cholelithiasis. Bilateral ovarian cysts or better assessed on prior MRI examination of 11/17/2019, and demonstrate interval decrease in size from remote prior examination of 07/07/2018, confirming a benign etiology. Multiple remote fracture deformities of the thoracolumbar spine and left hemipelvis. Superimposed subacute fracture of the left inferior pubic ramus      Disposition Plan  :    Status is: Inpatient  Remains inpatient appropriate because:IV treatments appropriate due to intensity of illness or inability to take PO   Dispo: The  patient is from: Home              Anticipated d/c is to: Home              Patient currently is not medically stable to d/c.   Difficult to place patient No  DVT Prophylaxis  :  SCDs    Lab Results  Component Value Date   PLT 40 (L) 12/31/2020   Lab Results  Component Value Date   INR 1.8 (H) 12/30/2020   INR 1.6 (H) 12/29/2020   INR 1.8 (H) 11/02/2020    Diet :  Diet Order            DIET DYS 2 Room service appropriate? No; Fluid consistency: Nectar Thick  Diet effective now                  Inpatient Medications  Scheduled Meds: . feeding supplement  237 mL Oral BID BM  . ferrous sulfate  325 mg Oral Q breakfast  . furosemide  40 mg Oral Daily  . lactulose  30 g Oral TID  . levothyroxine  75 mcg Oral QAC breakfast  . multivitamin with minerals  1 tablet Oral Daily  . pantoprazole (PROTONIX) IV  40 mg Intravenous Q12H  . potassium chloride SA  40 mEq Oral Daily  . rifaximin  550 mg Oral BID  . sertraline  150 mg Oral Daily  . spironolactone  200 mg Oral Daily   Continuous Infusions: . albumin human     PRN Meds:.albumin human, diclofenac Sodium, ondansetron (ZOFRAN) IV, oxyCODONE, Resource ThickenUp Clear  Antibiotics  :    Anti-infectives (From admission, onward)   Start     Dose/Rate Route Frequency Ordered Stop   12/29/20 2200  rifaximin (XIFAXAN) tablet 550 mg        550 mg Oral 2 times daily 12/29/20 1822         Time Spent in minutes  30   Lala Lund M.D on 12/31/2020 at 9:26 AM  To page go to [email or web address]   [facility] -  Office  [phone]    See all Orders from today for further details    Objective:   Vitals:  12/30/20 1614 12/30/20 1842 12/30/20 2055 12/31/20 0437  BP: (!) 141/70 132/73 (!) 144/64 102/71  Pulse: 84 90 84 77  Resp: 15 15 18 13   Temp:  98.4 F (36.9 C) 98.7 F (37.1 C) 98.9 F (37.2 C)  TempSrc:  Temporal Oral Axillary  SpO2: 96% 96% 95% 95%  Weight:      Height:        Wt Readings from  Last 3 Encounters:  12/29/20 56.1 kg  12/23/20 54.4 kg  12/09/20 52.4 kg     Intake/Output Summary (Last 24 hours) at 12/31/2020 0926 Last data filed at 12/31/2020 0811 Gross per 24 hour  Intake 240.9 ml  Output 1350 ml  Net -1109.1 ml     Physical Exam  Awake Alert, No new F.N deficits, extremely frail and cachectic [place].AT,PERRAL Supple Neck,No JVD, No cervical lymphadenopathy appriciated.  Symmetrical Chest wall movement, Good air movement bilaterally, CTAB RRR,No Gallops, Rubs or new Murmurs, No Parasternal Heave +ve B.Sounds, Abd ++ distended, No tenderness,   No Cyanosis, Clubbing or edema, No new Rash or bruise     Data Review:    CBC Recent Labs  Lab 12/29/20 1505 12/30/20 0400 12/31/20 0123  WBC 6.6 4.0 5.4  HGB 9.0* 7.4* 7.2*  HCT 29.9* 24.1* 23.7*  PLT 47* 34* 40*  MCV 91.2 90.6 89.1  MCH 27.4 27.8 27.1  MCHC 30.1 30.7 30.4  RDW 22.1* 21.8* 21.9*  LYMPHSABS  --   --  0.6*  MONOABS  --   --  0.8  EOSABS  --   --  0.0  BASOSABS  --   --  0.0    Recent Labs  Lab 12/29/20 1505 12/29/20 1653 12/29/20 1654 12/30/20 0400 12/30/20 0719 12/30/20 0723 12/31/20 0123  NA 135  --   --  135  --   --  136  K 4.3  --   --  4.6  --   --  4.2  CL 103  --   --  104  --   --  106  CO2 18*  --   --  22  --   --  21*  GLUCOSE 88  --   --  82  --   --  89  BUN 11  --   --  14  --   --  11  CREATININE 0.73  --   --  0.80  --   --  0.76  CALCIUM 8.6*  --   --  8.5*  --   --  8.9  AST 44*  --   --  36  --   --  38  ALT 22  --   --  20  --   --  18  ALKPHOS 131*  --   --  99  --   --  96  BILITOT 4.3*  --   --  4.4*  --   --  4.5*  ALBUMIN 2.9*  --   --  2.9*  --   --  3.2*  MG  --   --   --   --  2.0  --  2.0  CRP  --   --   --   --   --   --  2.9*  INR  --   --  1.6* 1.8*  --   --   --   AMMONIA  --  75*  --   --   --  86* 60*  BNP  --   --   --   --  43.3  --  120.9*     ------------------------------------------------------------------------------------------------------------------ No results for input(s): CHOL, HDL, LDLCALC, TRIG, CHOLHDL, LDLDIRECT in the last 72 hours.  No results found for: HGBA1C ------------------------------------------------------------------------------------------------------------------ No results for input(s): TSH, T4TOTAL, T3FREE, THYROIDAB in the last 72 hours.  Invalid input(s): FREET3  Cardiac Enzymes No results for input(s): CKMB, TROPONINI, MYOGLOBIN in the last 168 hours.  Invalid input(s): CK ------------------------------------------------------------------------------------------------------------------    Component Value Date/Time   BNP 120.9 (H) 12/31/2020 0123    Micro Results Recent Results (from the past 240 hour(s))  Gram stain     Status: None   Collection Time: 12/29/20  5:18 PM   Specimen: Peritoneal Washings; Peritoneal Fluid  Result Value Ref Range Status   Specimen Description PERITONEAL  Final   Special Requests NONE  Final   Gram Stain   Final    WBC PRESENT,BOTH PMN AND MONONUCLEAR NO ORGANISMS SEEN CYTOSPIN SMEAR Performed at [facility], [address] [place], [place] [address]    Report Status 12/29/2020 FINAL  Final  Culture, body fluid w Gram Stain-bottle     Status: None (Preliminary result)   Collection Time: 12/29/20  5:38 PM   Specimen: Peritoneal Washings  Result Value Ref Range Status   Specimen Description PERITONEAL  Final   Special Requests PERITONEAL FLD  Final   Culture   Final    NO GROWTH < 24 HOURS Performed at [facility], [place] [address] [place], [place] [address]    Report Status PENDING  Incomplete  Resp Panel by RT-PCR (Flu A&B, Covid) Nasopharyngeal Swab     Status: Abnormal   Collection Time: 12/29/20  7:24 PM   Specimen: Nasopharyngeal Swab; Nasopharyngeal(NP) swabs in vial transport medium  Result Value Ref Range Status   SARS  Coronavirus 2 by RT PCR POSITIVE (A) NEGATIVE Final    Comment: RESULT CALLED TO, READ BACK BY AND VERIFIED WITH: Y,PATTERSON @2014  12/29/20 EB (NOTE) SARS-CoV-2 target nucleic acids are DETECTED.  The SARS-CoV-2 RNA is generally detectable in upper respiratory specimens during the acute phase of infection. Positive results are indicative of the presence of the identified virus, but do not rule out bacterial infection or co-infection with other pathogens not detected by the test. Clinical correlation with patient history and other diagnostic information is necessary to determine patient infection status. The expected result is Negative.  Fact Sheet for Patients: EntrepreneurPulse.com.au  Fact Sheet for Healthcare Providers: IncredibleEmployment.be  This test is not yet approved or cleared by the [place] FDA and  has been authorized for detection and/or diagnosis of SARS-CoV-2 by FDA under an Emergency Use Authorization (EUA).  This EUA will remain in effect (meaning this test can be used ) for the duration of  the COVID-19 declaration under Section 564(b)(1) of the Act, 21 U.S.C. section 360bbb-3(b)(1), unless the authorization is terminated or revoked sooner.     Influenza A by PCR NEGATIVE NEGATIVE Final   Influenza B by PCR NEGATIVE NEGATIVE Final    Comment: (NOTE) The Xpert Xpress SARS-CoV-2/FLU/RSV plus assay is intended as an aid in the diagnosis of influenza from Nasopharyngeal swab specimens and should not be used as a sole basis for treatment. Nasal washings and aspirates are unacceptable for Xpert Xpress SARS-CoV-2/FLU/RSV testing.  Fact Sheet for Patients: EntrepreneurPulse.com.au  Fact Sheet for Healthcare Providers: IncredibleEmployment.be  This test is not yet approved or cleared by the [place]  FDA and has been authorized for detection and/or diagnosis of SARS-CoV-2 by FDA  under an Emergency Use Authorization (EUA). This EUA will remain in effect (meaning this test can be used) for the duration of the COVID-19 declaration under Section 564(b)(1) of the Act, 21 U.S.C. section 360bbb-3(b)(1), unless the authorization is terminated or revoked.  Performed at [facility], [place] [address] [place], [place] [address]     Radiology Reports CT Head Wo Contrast  Result Date: 12/23/2020 CLINICAL DATA:  Status post fall. EXAM: CT HEAD WITHOUT CONTRAST TECHNIQUE: Contiguous axial images were obtained from the base of the skull through the vertex without intravenous contrast. COMPARISON:  August 13, 2020 FINDINGS: Brain: There is mild cerebral atrophy with widening of the extra-axial spaces and ventricular dilatation. There are areas of decreased attenuation within the white matter tracts of the supratentorial brain, consistent with microvascular disease changes. Vascular: No hyperdense vessel or unexpected calcification. Skull: Normal. Negative for fracture or focal lesion. Sinuses/Orbits: No acute finding. Other: None. IMPRESSION: 1. Mild cerebral atrophy. 2. No acute intracranial abnormality. Electronically Signed   By: Virgina Norfolk M.D.   On: 12/23/2020 23:05   CT Cervical Spine Wo Contrast  Result Date: 12/23/2020 CLINICAL DATA:  Status post fall. EXAM: CT CERVICAL SPINE WITHOUT CONTRAST TECHNIQUE: Multidetector CT imaging of the cervical spine was performed without intravenous contrast. Multiplanar CT image reconstructions were also generated. COMPARISON:  July 07, 2020 FINDINGS: Alignment: Approximately 1 mm to 2 mm anterolisthesis of the C4 vertebral body is noted on C5. Approximately 2 mm retrolisthesis of the C5 vertebral body is noted on C6. Skull base and vertebrae: No acute fracture. No primary bone lesion or focal pathologic process. Soft tissues and spinal canal: No prevertebral fluid or swelling. No visible canal hematoma. Disc levels: Moderate  severity endplate sclerosis is seen at the levels of C4-C5, C5-C6 and C6-C7. Mild intervertebral disc space narrowing is present at the levels of C2-C3 and C3-C4. Marked severity intervertebral disc space narrowing is seen at the levels of C4-C5, C5-C6 and C6-C7. Bilateral moderate severity multilevel facet joint hypertrophy is noted. Upper chest: Negative. Other: None. IMPRESSION: 1. Marked severity multilevel degenerative changes, as described above. 2. Approximately 1 mm to 2 mm anterolisthesis of the C4 vertebral body on C5. 3. Approximately 2 mm retrolisthesis of the C5 vertebral body on C6. 4. No acute cervical spine fracture. Electronically Signed   By: Virgina Norfolk M.D.   On: 12/23/2020 23:08   CT ABDOMEN PELVIS W CONTRAST  Result Date: 12/30/2020 CLINICAL DATA:  Acute, nonlocalized abdominal pain EXAM: CT ABDOMEN AND PELVIS WITH CONTRAST TECHNIQUE: Multidetector CT imaging of the abdomen and pelvis was performed using the standard protocol following bolus administration of intravenous contrast. CONTRAST:  39m OMNIPAQUE IOHEXOL 300 MG/ML  SOLN COMPARISON:  12/23/2020 FINDINGS: Lower chest: Small left pleural effusion and loculated right lateral basal pleural effusion are unchanged. Cardiac size within normal limits. Bilateral breast implants are partially visualized. Hepatobiliary: Cirrhosis. No enhancing intrahepatic mass. No intrahepatic or extrahepatic biliary ductal dilation. Cholelithiasis again noted. Pancreas: Unremarkable Spleen: Stable mild splenomegaly Adrenals/Urinary Tract: The adrenal glands are unremarkable. The kidneys are normal in size and position. Previously noted right hydronephrosis has resolved. Stable nonobstructing 13 mm right renal pelvic calculus. No additional intrarenal or ureteral calculi. The bladder is unremarkable. Stomach/Bowel: Moderate stool within the distal colon and rectal vault without evidence of obstruction. Stable marked bowel wall thickening involving the  cecum and ascending colon in keeping with  changes of portal colopathy, or infectious or inflammatory colitis. The stomach and small bowel are unremarkable. No evidence of obstruction. Large volume ascites is again noted, slightly increased in volume since prior examination. No free intraperitoneal gas. Vascular/Lymphatic: Several small gastroesophageal varices are identified as well as recanalization of the umbilical vein in keeping with changes of portal venous hypertension. The abdominal vasculature is otherwise unremarkable. Reproductive: Stable simple appearing cysts within the ovaries bilaterally measuring 2.4 cm on the right and 1.7 cm on the left. While stable since prior examination, these are decreased in size when compared to remote prior examination of 07/07/2018 in keeping with a benign etiology. Uterus unremarkable. Other: Diffuse subcutaneous body wall edema again noted in keeping with anasarca. Diffuse body wall wasting again noted. Musculoskeletal: Left hip ORIF noted. Subacute fracture of the left inferior pubic ramus is again noted in addition to remote fracture of this structure slightly anteriorly. Remote fracture deformities of the a left superior pubic ramus and left iliac crest are again identified. Compression deformities of T12, L1, and L5 are again identified and are stable from remote prior examinations dating as far back as 07/07/2018. No acute bone abnormality. IMPRESSION: Stable bilateral pleural effusions. Cirrhosis with changes of portal venous hypertension with mild splenomegaly, small portosystemic collaterals, and large volume ascites. Ascites has slightly increased in overall volume when compared to prior examination. Marked bowel wall thickening involving the cecum and ascending colon. 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 Cholelithiasis. Bilateral ovarian cysts or  better assessed on prior MRI examination of 11/17/2019, and demonstrate interval decrease in size from remote prior examination of 07/07/2018, confirming a benign etiology. Multiple remote fracture deformities of the thoracolumbar spine and left hemipelvis. Superimposed subacute fracture of the left inferior pubic ramus. Electronically Signed   By: Fidela Salisbury MD   On: 12/30/2020 03:22   CT ABDOMEN PELVIS W CONTRAST  Result Date: 12/23/2020 CLINICAL DATA:  Abdominal distension EXAM: CT ABDOMEN AND PELVIS WITH CONTRAST TECHNIQUE: Multidetector CT imaging of the abdomen and pelvis was performed using the standard protocol following bolus administration of intravenous contrast. CONTRAST:  51m OMNIPAQUE IOHEXOL 300 MG/ML  SOLN COMPARISON:  11/02/2020 CT, 09/03/2020, 08/15/2020 FINDINGS: Lower chest: Lung bases demonstrate partially visualized bilateral breast implants. Loculated fluid collection along the right lateral chest measures 8 x 2.7 cm and is unchanged in size. Small left-sided pleural effusion with dependent atelectasis. Hepatobiliary: Cirrhosis of the liver. Multiple calcified gallstones. No biliary dilatation Pancreas: Unremarkable. No pancreatic ductal dilatation or surrounding inflammatory changes. Spleen: Enlarged, measuring 16 cm craniocaudad. Adrenals/Urinary Tract: Adrenal glands are normal. Kidneys show minimal right hydronephrosis with stable 14 mm right pelvic stone. Subcentimeter hypodensity within the mid left kidney too small to further characterize. The bladder is unremarkable Stomach/Bowel: The stomach is nonenlarged. No dilated small bowel. Worsened diffuse colon wall thickening, most evident involving the right colon, transverse colon and descending colon. Vascular/Lymphatic: Nonaneurysmal aorta. Subcentimeter retroperitoneal nodes. Small gastroesophageal and perirectal varices. Recanalized paraumbilical vein. Reproductive: Uterus unremarkable. Stable 2.1 cm right adnexal cyst and 1.3  cm left adnexal cyst. Other: No free air. Moderate to large volume of ascites within the abdomen and pelvis. Generalized body wall edema with subcutaneous fluid. Musculoskeletal: Chronic compression deformity of L5 and at the superior endplates of TE42and L1. Chronic fracture deformity of the sacrum. Old fracture deformity of the left iliac bone. Chronic fracture deformity of left inferior  pubic ramus. Acute to subacute fracture deformity of left inferior pubic ramus slightly more posterior to the chronic deformity. IMPRESSION: 1. 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 Moderate to large volume of ascites within the abdomen and pelvis. Similar small left-sided pleural effusion. Generalized subcutaneous fluid and edema consistent with anasarca. 2. Slight worsening of diffuse colon wall thickening which could be due to portal colopathy although colitis could also produce this appearance. 3. Multiple chronic fracture deformities. There is an acute to subacute appearing fracture involving the left inferior pubic ramus posterior to a chronic fracture deformity. 4. Multiple gallstones 5. Stable right renal pelvis calcification with minimal right hydronephrosis. 6. Stable bilateral ovarian cysts measuring up to 2.1 cm. No follow-up imaging recommended. Note: This recommendation does not apply to premenarchal patients and to those with increased risk (genetic, family history, elevated tumor markers or other high-risk factors) of ovarian cancer. Reference: JACR 2020 Feb; 17(2):248-254 Electronically Signed   By: Donavan Foil M.D.   On: 12/23/2020 23:14   DG Chest Port 1 View  Result Date: 12/30/2020 CLINICAL DATA:  Shortness of breath and altered mental status. EXAM: PORTABLE CHEST 1 VIEW COMPARISON:  09/08/2020 FINDINGS: Subtle opacity in the right upper lung may be fluid in a fissure. There is focal nodular opacity in the peripheral right base. Low volume film with chronic underlying interstitial  coarsening. Cardiopericardial silhouette is at upper limits of normal for size. The visualized bony structures of the thorax show no acute abnormality. Telemetry leads overlie the chest. IMPRESSION: Opacity at the right lung base compatible with loculated pleural fluid seen on CT abdomen earlier today. There is probably some fluid in the right major fissure is well. CT chest could be used to further evaluate as clinically warranted. Electronically Signed   By: Misty Stanley M.D.   On: 12/30/2020 13:09   XR HIP UNILAT W OR W/O PELVIS 2-3 VIEWS LEFT  Result Date: 12/04/2020 AP pelvis lateral left hip obtained demonstrating a prior hip nailing in good position.  No evidence of a hip fracture.  There are some fracture line visible about the greater trochanter but they look old.  There is also a prior superior and inferior pubic rami fracture on the left.  Compared to films performed previously there did not appear to be any change.  There was some bulky changes about the iliac crest on the left but are similar to the prior films.  I do not see any acute changes on today's films  XR Ankle 2 Views Left  Result Date: 12/04/2020 Films of the right ankle obtained in 2 projections no acute changes.  Diffuse osteopenia.  Prior os calcis fracture appears to have healed with some collapse but there is no pain.  No soft tissue swelling  IR Paracentesis  Result Date: 12/18/2020 INDICATION: Recurrent ascites EXAM: ULTRASOUND GUIDED therapeutic PARACENTESIS MEDICATIONS: 10 mL 1% lidocaine COMPLICATIONS: None immediate. PROCEDURE: Informed written consent was obtained from the patient after a discussion of the risks, benefits and alternatives to treatment. A timeout was performed prior to the initiation of the procedure. Initial ultrasound scanning demonstrates a large amount of ascites within the left lower abdominal quadrant. The left lower abdomen was prepped and draped in the usual sterile fashion. 1% lidocaine was used  for local anesthesia. Following this, a 19 gauge, 7-cm, Yueh catheter was introduced. An ultrasound image was saved for documentation purposes. The paracentesis was performed. The catheter was removed and a dressing was applied. The patient  tolerated the procedure well without immediate post procedural complication. Patient received post-procedure intravenous albumin; see nursing notes for details. FINDINGS: A total of approximately 6.2 L of clear yellow fluid was removed. Samples were not sent to the laboratory as requested by the clinical team. IMPRESSION: Successful ultrasound-guided paracentesis yielding 6.2 liters of peritoneal fluid. Read by: Durenda Guthrie, PA-C Electronically Signed   By: Jacqulynn Cadet M.D.   On: 12/18/2020 11:23   IR Paracentesis  Result Date: 12/12/2020 INDICATION: Patient with history of NASH cirrhosis, recurrent ascites; request received for therapeutic paracentesis up to 10 liters. EXAM: ULTRASOUND GUIDED THERAPEUTIC  PARACENTESIS MEDICATIONS: 1% lidocaine to skin/ subcutaneous tissue COMPLICATIONS: None immediate. PROCEDURE: Informed written consent was obtained from the patient after a discussion of the risks, benefits and alternatives to treatment. A timeout was performed prior to the initiation of the procedure. Initial ultrasound scanning demonstrates a large amount of ascites within the left mid to lower abdominal quadrant. The left mid to lower abdomen was prepped and draped in the usual sterile fashion. 1% lidocaine was used for local anesthesia. Following this, a 19 gauge, 7-cm, Yueh catheter was introduced. An ultrasound image was saved for documentation purposes. The paracentesis was performed. The catheter was removed and a dressing was applied. The patient tolerated the procedure well without immediate post procedural complication. Patient received post-procedure intravenous albumin; see nursing notes for details. FINDINGS: A total of approximately 5.2 liters of yellow  fluid was removed. IMPRESSION: Successful ultrasound-guided therapeutic paracentesis yielding 5.2 liters of peritoneal fluid. Read by: Nash Mantis Electronically Signed   By: Corrie Mckusick D.O.   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 COMPLICATIONS: None immediate. PROCEDURE: Informed written consent was obtained from the patient after a discussion of the risks, benefits and alternatives to treatment. A timeout was performed prior to the initiation of the procedure. Initial ultrasound scanning demonstrates a large amount of ascites within the left lower abdominal quadrant. The left lower abdomen was prepped and draped in the usual sterile fashion. 1% lidocaine was used for local anesthesia. Following this, a 19 gauge, 7-cm, Yueh catheter was introduced. An ultrasound image was saved for documentation purposes. The paracentesis was performed. The catheter was removed and a dressing was applied. The patient tolerated the procedure well without immediate post procedural complication. Patient received post-procedure intravenous albumin; see nursing notes for details. FINDINGS: A total of approximately 8 liters of clear yellow fluid was removed. IMPRESSION: Successful ultrasound-guided paracentesis yielding 8 liters of peritoneal fluid. Read by: Durenda Guthrie, PA-C Electronically Signed   By: Jacqulynn Cadet M.D.   On: 12/05/2020 14:20

## 2020-12-31 NOTE — Evaluation (Signed)
Physical Therapy Evaluation Patient Details Name: Megan Roberson MRN: [id number] DOB: 1955/11/04 Today's Date: 12/31/2020   History of Present Illness  Pt is a 65 y.o. female admitted 12/29/20 with AMS; workup consistent with severe hepatic encephalopathy due to noncompliance with lactulose; incidental (+) COVID-19. Pt also with multiple remote fx deformities of thorcolumbar spine and L hemipelvis (WBAT). S/p paracentesis 3/1. PMH includes NASH, portal HTN, recurrent ascites requiring weekly paracentesis, hepatic encephalopathy, chronic abdominal pain (oxycodone PRN), chronic thrombocytopenia.    Clinical Impression  Pt presents with an overall decrease in functional mobility secondary to above. PTA, pt living at home with husband who assists her with stand pivot transfers to wheelchair/BSC; pt reports that she can mobilize herself when husband at work overnight. Today, pt required modA+2 to perform stand pivot transfer to [facility] and recliner; pt limited by lethargy and confusion. Pt would benefit from continued acute PT services to maximize functional mobility and independence prior to d/c with HHPT services, would also benefit from [facility] aide; will require 24/7 direct assistance for mobility for safe return home.     Follow Up Recommendations Home health PT;Supervision/Assistance - 24 hour ([place] aide)    Equipment Recommendations   (TBD)    Recommendations for Other Services Palliative Consult     Precautions / Restrictions Precautions Precautions: Fall;Other (comment) Precaution Comments: skin tears/fragile skin Restrictions Weight Bearing Restrictions: No Other Position/Activity Restrictions: Per Dr. Candiss Norse - [facility] to [facility] for subacute pelvic fracture      Mobility  Bed Mobility Overal bed mobility: Needs Assistance Bed Mobility: Supine to Sit     Supine to sit: Mod assist     General bed mobility comments: Pt reaching for bed rail, modA to assist trunk elevation and scooting hips to  EOB; significant increased time and effort requiring frequent direction to task and for sequencing    Transfers Overall transfer level: Needs assistance Equipment used: 2 person hand held assist Transfers: Sit to/from [facility] Sit to Stand: Mod assist;+2 physical assistance;+2 safety/equipment Stand pivot transfers: Mod assist;+2 physical assistance;+2 safety/equipment       General transfer comment: ModA+2 to assist trunk elevation and take pivotal steps from bed>BSC>recliner; pt with difficulty taking steps (especially with RLE), noted R knee instability; frequent cues for sequencing  Ambulation/Gait                Stairs            Wheelchair Mobility    Modified Rankin (Stroke Patients Only)       Balance Overall balance assessment: Needs assistance Sitting-balance support: No upper extremity supported;Feet supported Sitting balance-Leahy Scale: Fair     Standing balance support: Bilateral upper extremity supported;During functional activity Standing balance-Leahy Scale: Poor Standing balance comment: Reliant on UE support and external assist to maintain static standing; dependent for pericare                             Pertinent Vitals/Pain Pain Assessment: Faces Pain Score: 8  Faces Pain Scale: Hurts even more Pain Location: abdomen Pain Descriptors / Indicators: Aching;Discomfort;Grimacing;Guarding Pain Intervention(s): Limited activity within patient's tolerance;Monitored during session    Home Living Family/patient expects to be discharged to:: Private residence Living Arrangements: Spouse/significant other Available Help at Discharge: Family;Available PRN/intermittently Type of Home: House Home Access: Stairs to enter Entrance Stairs-Rails: None Entrance Stairs-Number of Steps: 2 Home Layout: One level Home Equipment: Walker - 2 wheels;Wheelchair - manual;Shower seat;Bedside commode;Cane -  single point       Prior Function Level of Independence: Needs assistance   Gait / Transfers Assistance Needed: Husband assists with stand pivot transfers to [facility] and w/c  ADL's / Homemaking Assistance Needed: Husband assists with ADL tasks; pt uses BSC        Hand Dominance   Dominant Hand: Right    Extremity/Trunk Assessment   Upper Extremity Assessment Upper Extremity Assessment: Generalized weakness LUE Deficits / Details: L hand contracture; digits 3-4 contracted in composite flexion; index finger in extension; may benefit form palm guard at night LUE Coordination: decreased fine motor;decreased gross motor    Lower Extremity Assessment Lower Extremity Assessment: RLE deficits/detail;LLE deficits/detail RLE Deficits / Details: Functionally </ 3/5 throughout; RLE appears weaker than LLE; able to perform partial SLR and LAQ; pes cavus feet deformity LLE Deficits / Details: Functionally </ 3/5 throughout; able to perform partial SLR, full LAQ; pes cavus feet deformity    Cervical / Trunk Assessment Cervical / Trunk Assessment: Kyphotic (cachectic)  Communication   Communication: Expressive difficulties (weak voice)  Cognition Arousal/Alertness: Lethargic Behavior During Therapy: Flat affect Overall Cognitive Status: No family/caregiver present to determine baseline cognitive functioning Area of Impairment: Orientation;Attention;Following commands;Safety/judgement;Awareness;Problem solving                 Orientation Level: Disoriented to;Time Current Attention Level: Sustained   Following Commands: Follows one step commands with increased time Safety/Judgement: Decreased awareness of safety;Decreased awareness of deficits Awareness: Emergent Problem Solving: Slow processing;Decreased initiation        General Comments General comments (skin integrity, edema, etc.): VSS on RA    Exercises     Assessment/Plan    PT Assessment Patient needs continued PT services  PT Problem  List Decreased strength;Decreased activity tolerance;Decreased balance;Decreased range of motion;Decreased mobility;Decreased cognition;Decreased knowledge of use of DME;Decreased safety awareness;Decreased skin integrity;Pain       PT Treatment Interventions DME instruction;Functional mobility training;Therapeutic activities;Therapeutic exercise;Balance training;Patient/family education;Wheelchair mobility training    PT Goals (Current goals can be found in the Care Plan section)  Acute Rehab PT Goals Patient Stated Goal: to return home PT Goal Formulation: With patient Time For Goal Achievement: 01/14/21 Potential to Achieve Goals: Fair    Frequency Min 3X/week   Barriers to discharge Decreased caregiver support      Co-evaluation PT/OT/SLP Co-Evaluation/Treatment: Yes Reason for Co-Treatment: Necessary to address cognition/behavior during functional activity;For patient/therapist safety;To address functional/ADL transfers PT goals addressed during session: Mobility/safety with mobility;Balance         AM-PAC PT "6 Clicks" Mobility  Outcome Measure Help needed turning from your back to your side while in a flat bed without using bedrails?: A Lot Help needed moving from lying on your back to sitting on the side of a flat bed without using bedrails?: A Lot Help needed moving to and from a bed to a chair (including a wheelchair)?: A Lot Help needed standing up from a chair using your arms (e.g., wheelchair or bedside chair)?: A Lot Help needed to walk in hospital room?: A Lot Help needed climbing 3-5 steps with a railing? : Total 6 Click Score: 11    End of Session Equipment Utilized During Treatment: Gait belt Activity Tolerance: Patient limited by fatigue Patient left: in chair;with call bell/phone within reach;with chair alarm set Nurse Communication: Mobility status PT Visit Diagnosis: Other abnormalities of gait and mobility (R26.89);Muscle weakness (generalized)  (M62.81);Adult, failure to thrive (R62.7)    Time: 4562-5638 PT Time Calculation (min) (ACUTE  ONLY): 44 min   Charges:   PT Evaluation $PT Eval Moderate Complexity: [place], PT, DPT Acute Rehabilitation Services  Pager [phone] Office [place] 12/31/2020, 4:24 PM

## 2020-12-31 NOTE — Telephone Encounter (Signed)
Scheduled patient for EGD with Dr. Havery Moros on 4-21. Covid test on 4-18 at 11:30am. Patient has OV on 3-29 and will be instructed.

## 2020-12-31 NOTE — Plan of Care (Signed)
Patient is currently resting in bed. C/o abd pain, given oxy PRN. One time dose given this AM for increased pain. VSS. Purewick in place. Patient states she feels like she has to have a BM, but is doing nothing but passing gas. Call bell within reach. Bed alarm on.   Problem: Education: Goal: Knowledge of risk factors and measures for prevention of condition will improve Outcome: Progressing   Problem: Coping: Goal: Psychosocial and spiritual needs will be supported Outcome: Progressing   Problem: Respiratory: Goal: Will maintain a patent airway Outcome: Progressing Goal: Complications related to the disease process, condition or treatment will be avoided or minimized Outcome: Progressing   Problem: Pain Managment: Goal: General experience of comfort will improve Outcome: Progressing   Problem: Safety: Goal: Ability to remain free from injury will improve Outcome: Progressing   Problem: Skin Integrity: Goal: Risk for impaired skin integrity will decrease Outcome: Progressing

## 2020-12-31 NOTE — Procedures (Signed)
PROCEDURE SUMMARY:  Successful US guided paracentesis from LLQ.  Yielded 6 L of clear yellow fluid.  No immediate complications.  Pt tolerated well.   Specimen was not sent for labs.  EBL < 64m  KAscencion DikePA-C 12/31/2020 12:13 PM

## 2020-12-31 NOTE — Telephone Encounter (Signed)
Thanks for the follow up. Sorry to see this. Her sodium and renal function is improved. This is being managed by in the inpatient service now. Would recommend continuing diuretics and we can check her labs about a week post discharge. She will need outpatient follow up with me after her discharge. Thank you

## 2020-12-31 NOTE — Progress Notes (Signed)
Initial Nutrition Assessment  DOCUMENTATION CODES:   Not applicable  INTERVENTION:  Provide Ensure Enlive po BID (thickened to appropriate consistency), each supplement provides 350 kcal and 20 grams of protein.  Provide Boost Plus po TID (thickened to appropriate consistency), each supplement provides 360 kcal, 14 grams of protein.   Encourage adequate PO intake.   NUTRITION DIAGNOSIS:   Increased nutrient needs related to chronic illness (NASH cirrhosis w/ ascites) as evidenced by estimated needs.  GOAL:   Patient will meet greater than or equal to 90% of their needs  MONITOR:   PO intake,Supplement acceptance,Skin,Other (Comment),Weight trends,Labs,I & O's  REASON FOR ASSESSMENT:   Malnutrition Screening Tool    ASSESSMENT:   65 y.o. female with medical history significant of NASH with portal hypertension and recurrent ascites requiring weekly paracentesis, history of hepatic encephalopathy, chronic abdominal pain, constipation, chronic thrombocytopenia, chronic iron deficiency anemia, presented with altered mentation. Pt with severe hepatic encephalopathy due to noncompliance with lactulose. Covid positive.   3/1- s/p paracentesis with yield of 6 L  Pt unavailable during attempted time of contact. RD unable to obtain pt nutrition history at this time. Meal completion poor at 5%. Pt currently has Boost Plus and Ensure ordered and has been consuming them. RD to continue with current orders to aid in caloric and protein needs. Unable to complete Nutrition-Focused physical exam at this time.   Labs and medications reviewed.   Diet Order:   Diet Order            DIET DYS 2 Room service appropriate? No; Fluid consistency: Nectar Thick  Diet effective now                 EDUCATION NEEDS:   Not appropriate for education at this time  Skin:  Skin Assessment: Reviewed RN Assessment  Last BM:  2/23  Height:   Ht Readings from Last 1 Encounters:  12/29/20 5' 7"   (1.702 m)    Weight:   Wt Readings from Last 1 Encounters:  12/29/20 56.1 kg   BMI:  Body mass index is 19.37 kg/m.  Estimated Nutritional Needs:   Kcal:  1850-2050  Protein:  90-100 grams  Fluid:  >/= 1.5 L/day  Corrin Parker, MS, RD, LDN RD pager number/after hours weekend pager number on Amion.

## 2020-12-31 NOTE — Telephone Encounter (Signed)
Dr. Havery Moros, patient was due for repeat labs. It looks like patient is currently admitted and had labs drawn today to include a CMET. Please advise, thank you.

## 2020-12-31 NOTE — Progress Notes (Signed)
Occupational Therapy Evaluation Patient Details Name: Megan Roberson MRN: [id number] DOB: 06-Jun-1956 Today's Date: 12/31/2020    History of Present Illness Pt is a 65 y.o. female admitted 12/29/20 with AMS; workup consistent with severe hepatic encephalopathy due to noncompliance with lactulose; incidental (+) COVID-19. Pt also with multiple remote fx deformities of thorcolumbar spine and L hemipelvis (WBAT). PMH includes NASH, portal HTN, recurrent ascites requiring weekly paracentesis, hepatic encephalopathy, chronic abdominal pain (oxycodone PRN), chronic thrombocytopenia.   Clinical Impression   Pt from home where she reports she was able to transfer herself to the [facility] when husband is at work. Husband apparently assists with ADL tasks. Pt primarily at wc level but states she also uses a RW. Pt lethargic and confused at times during evaluation. Pt will need 24/7 direct assistance with mobility and ADL to safely DC home. Recommend follow up [place] and [place]. Will follow acutely. VSS during session.  Recommend Palliative Care Consult.    Follow Up Recommendations  Home health OT;Supervision/Assistance - 24 hour    Equipment Recommendations  3 in 1 bedside commode (unsure what pt has)    Recommendations for Other Services  (Palliative Care)     Precautions / Restrictions Precautions Precautions: Fall Precaution Comments: skin tears/fragile skin      Mobility Bed Mobility Overal bed mobility: Needs Assistance Bed Mobility: Supine to Sit     Supine to sit: Mod assist     General bed mobility comments: Pt reaching for bed rail, modA to assist trunk elevation and scooting hips to EOB; significant increased time and effort requiring frequent direction to task and for sequencing    Transfers Overall transfer level: Needs assistance Equipment used: 2 person hand held assist Transfers: Sit to/from [facility] Sit to Stand: Mod assist;+2 physical assistance;+2  safety/equipment Stand pivot transfers: Mod assist;+2 physical assistance;+2 safety/equipment       General transfer comment: ModA+2 to assist trunk elevation and take pivotal steps from bed>BSC>recliner; pt with difficulty taking steps (especially with RLE), noted R knee instability; frequent cues for sequencing    Balance Overall balance assessment: Needs assistance Sitting-balance support: No upper extremity supported;Feet supported Sitting balance-Leahy Scale: Fair     Standing balance support: Bilateral upper extremity supported;During functional activity Standing balance-Leahy Scale: Poor Standing balance comment: Reliant on UE support and external assist to maintain static standing; dependent for pericare                           ADL either performed or assessed with clinical judgement   ADL Overall ADL's : Needs assistance/impaired Eating/Feeding: Moderate assistance Eating/Feeding Details (indicate cue type and reason): at times; poor attention to task affecting ability to self feed; Unaware of R finger resting in ice cream; will need at least intermittent A to improve PO intake Grooming: Moderate assistance;Sitting   Upper Body Bathing: Moderate assistance;Sitting   Lower Body Bathing: Moderate assistance;Sit to/from stand   Upper Body Dressing : Moderate assistance;Sitting   Lower Body Dressing: Maximal assistance;Sit to/from stand   Toilet Transfer: Moderate assistance;+2 for safety/equipment   Toileting- Clothing Manipulation and Hygiene: Maximal assistance       Functional mobility during ADLs: Moderate assistance;+2 for safety/equipment       Vision   Additional Comments: "burning eyes"; keeping eyes closed majority of session or 1 eye closed; no double vision reported     Perception     Praxis      Pertinent Vitals/Pain  Pain Assessment: 0-10 Pain Score: 8  Pain Location: abdomen Pain Descriptors / Indicators:  Aching;Discomfort;Grimacing;Guarding Pain Intervention(s): Limited activity within patient's tolerance     Hand Dominance Right   Extremity/Trunk Assessment Upper Extremity Assessment Upper Extremity Assessment: Generalized weakness LUE Deficits / Details: L hand contracture; 3/4 digits contracted in compostire flexion; index finger in extension; may benefit form palm guard at night LUE Coordination: decreased fine motor;decreased gross motor   Lower Extremity Assessment Lower Extremity Assessment: Defer to PT evaluation   Cervical / Trunk Assessment Cervical / Trunk Assessment: Kyphotic (cachectic)   Communication Communication Communication: No difficulties   Cognition Arousal/Alertness: Lethargic Behavior During Therapy: Flat affect Overall Cognitive Status: No family/caregiver present to determine baseline cognitive functioning Area of Impairment: Orientation;Attention;Following commands;Safety/judgement;Awareness;Problem solving                 Orientation Level: Disoriented to;Time Current Attention Level: Sustained   Following Commands: Follows one step commands with increased time Safety/Judgement: Decreased awareness of safety;Decreased awareness of deficits Awareness: Emergent Problem Solving: Slow processing;Decreased initiation (very slow)     General Comments  VSS    Exercises     Shoulder Instructions      Home Living Family/patient expects to be discharged to:: Private residence Living Arrangements: Spouse/significant other Available Help at Discharge: Family;Available PRN/intermittently Type of Home: House Home Access: Stairs to enter [employer] of Steps: 2 Entrance Stairs-Rails: None Home Layout: One level     Bathroom Shower/Tub: Teacher, early years/pre: Standard Bathroom Accessibility: Yes How Accessible: Accessible via walker Home Equipment: [place] - 2 wheels;Wheelchair - manual;Shower seat;Bedside commode;Cane  - single point          Prior Functioning/Environment Level of Independence: Needs assistance  Gait / Transfers Assistance Needed: husband assists with standing ADL's / 47 Assistance Needed: assisted by spouse            OT Problem List: Decreased strength;Decreased range of motion;Decreased activity tolerance;Impaired balance (sitting and/or standing);Decreased coordination;Decreased cognition;Decreased safety awareness;Decreased knowledge of use of DME or AE;Cardiopulmonary status limiting activity;Impaired UE functional use;Pain;Increased edema      OT Treatment/Interventions: Self-care/ADL training;Therapeutic exercise;Neuromuscular education;Energy conservation;DME and/or AE instruction;Therapeutic activities;Cognitive remediation/compensation;Visual/perceptual remediation/compensation;Patient/family education;Balance training    OT Goals(Current goals can be found in the care plan section) Acute Rehab OT Goals Patient Stated Goal: to return home OT Goal Formulation: With patient Time For Goal Achievement: 01/14/21 Potential to Achieve Goals: Good  OT Frequency: Min 2X/week   Barriers to D/C:            Co-evaluation              AM-PAC OT "6 Clicks" Daily Activity     Outcome Measure Help from another person eating meals?: A Lot Help from another person taking care of personal grooming?: A Lot Help from another person toileting, which includes using toliet, bedpan, or urinal?: A Lot Help from another person bathing (including washing, rinsing, drying)?: A Lot Help from another person to put on and taking off regular upper body clothing?: A Lot Help from another person to put on and taking off regular lower body clothing?: A Lot 6 Click Score: 12   End of Session Equipment Utilized During Treatment: Gait belt Nurse Communication: Mobility status  Activity Tolerance: Patient tolerated treatment well Patient left: in chair;with call bell/phone within  reach;with chair alarm set  OT Visit Diagnosis: Unsteadiness on feet (R26.81);Other abnormalities of gait and mobility (R26.89);Muscle weakness (generalized) (M62.81);Other symptoms and signs involving  cognitive function;Pain Pain - part of body:  (abdomen)                Time: 8416-6063 OT Time Calculation (min): 44 min Charges:  OT General Charges $OT Visit: 1 Visit OT Evaluation $OT Eval Moderate Complexity: 1 Mod OT Treatments $Self Care/Home Management : 8-22 mins  Maurie Boettcher, OT/L   Acute OT Clinical Specialist [place] Pager [phone] Office [phone]   [facility] 12/31/2020, 3:53 PM

## 2020-12-31 NOTE — Addendum Note (Signed)
Addended by: Yevette Edwards on: 12/31/2020 12:13 PM   Modules accepted: Orders

## 2020-12-31 NOTE — Telephone Encounter (Signed)
Reminder in epic to see when patient is discharged. Patient is currently scheduled for a follow up on Tuesday, 01/28/21 at 2:10 PM.   Lab order in epic.

## 2021-01-01 DIAGNOSIS — R0602 Shortness of breath: Secondary | ICD-10-CM

## 2021-01-01 DIAGNOSIS — Z515 Encounter for palliative care: Secondary | ICD-10-CM

## 2021-01-01 DIAGNOSIS — Z7189 Other specified counseling: Secondary | ICD-10-CM

## 2021-01-01 LAB — CBC WITH DIFFERENTIAL/PLATELET
Abs Immature Granulocytes: 0.08 10*3/uL — ABNORMAL HIGH (ref 0.00–0.07)
Basophils Absolute: 0 10*3/uL (ref 0.0–0.1)
Basophils Relative: 0 %
Eosinophils Absolute: 0 10*3/uL (ref 0.0–0.5)
Eosinophils Relative: 1 %
HCT: 28 % — ABNORMAL LOW (ref 36.0–46.0)
Hemoglobin: 8.5 g/dL — ABNORMAL LOW (ref 12.0–15.0)
Immature Granulocytes: 1 %
Lymphocytes Relative: 7 %
Lymphs Abs: 0.5 10*3/uL — ABNORMAL LOW (ref 0.7–4.0)
MCH: 27.3 pg (ref 26.0–34.0)
MCHC: 30.4 g/dL (ref 30.0–36.0)
MCV: 90 fL (ref 80.0–100.0)
Monocytes Absolute: 1 10*3/uL (ref 0.1–1.0)
Monocytes Relative: 15 %
Neutro Abs: 5 10*3/uL (ref 1.7–7.7)
Neutrophils Relative %: 76 %
Platelets: 50 10*3/uL — ABNORMAL LOW (ref 150–400)
RBC: 3.11 MIL/uL — ABNORMAL LOW (ref 3.87–5.11)
RDW: 21.5 % — ABNORMAL HIGH (ref 11.5–15.5)
WBC: 6.5 10*3/uL (ref 4.0–10.5)
nRBC: 0 % (ref 0.0–0.2)

## 2021-01-01 LAB — C-REACTIVE PROTEIN: CRP: 4.3 mg/dL — ABNORMAL HIGH (ref <1.0)

## 2021-01-01 LAB — COMPREHENSIVE METABOLIC PANEL
ALT: 16 U/L (ref 0–44)
AST: 38 U/L (ref 15–41)
Albumin: 3.1 g/dL — ABNORMAL LOW (ref 3.5–5.0)
Alkaline Phosphatase: 94 U/L (ref 38–126)
Anion gap: 9 (ref 5–15)
BUN: 8 mg/dL (ref 8–23)
CO2: 25 mmol/L (ref 22–32)
Calcium: 8.9 mg/dL (ref 8.9–10.3)
Chloride: 100 mmol/L (ref 98–111)
Creatinine, Ser: 0.8 mg/dL (ref 0.44–1.00)
GFR, Estimated: >60 mL/min (ref >60)
Glucose, Bld: 130 mg/dL — ABNORMAL HIGH (ref 70–99)
Potassium: 4.1 mmol/L (ref 3.5–5.1)
Sodium: 134 mmol/L — ABNORMAL LOW (ref 135–145)
Total Bilirubin: 4.4 mg/dL — ABNORMAL HIGH (ref 0.3–1.2)
Total Protein: 5.6 g/dL — ABNORMAL LOW (ref 6.5–8.1)

## 2021-01-01 LAB — MAGNESIUM: Magnesium: 2.1 mg/dL (ref 1.7–2.4)

## 2021-01-01 LAB — AMMONIA: Ammonia: 71 umol/L — ABNORMAL HIGH (ref 9–35)

## 2021-01-01 LAB — BRAIN NATRIURETIC PEPTIDE: B Natriuretic Peptide: 72.8 pg/mL (ref 0.0–100.0)

## 2021-01-01 MED ORDER — MORPHINE SULFATE (PF) 2 MG/ML IV SOLN
2.0000 mg | Freq: Once | INTRAVENOUS | Status: AC
  Administered 2021-01-01: 2 mg via INTRAVENOUS
  Filled 2021-01-01: qty 1

## 2021-01-01 NOTE — Progress Notes (Signed)
PROGRESS NOTE                                                                                                                                                                                                             Patient Demographics:    Megan Roberson, is a 65 y.o. female, DOB - [birth year], YIF:[id number]  Outpatient Primary MD for the patient is McGowen, Adrian Blackwater, MD    LOS - 2  Admit date - 12/29/2020    Chief Complaint  Patient presents with  . Altered Mental Status       Brief Narrative (HPI from H&P) - Megan Roberson is a 65 y.o. 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   Subjective:   Patient in bed, appears comfortable, denies any headache, no fever, no chest pain or pressure, no shortness of breath , chronic generalized abdominal pain. No focal weakness.   Assessment  & Plan :     1. Acute hepatic encephalopathy noncompliance with lactulose, advanced underlying cirrhosis - she has been counseled on compliance with lactulose, has been placed on lactulose and Xifaxan, mentation has improved, continue supportive care, PT-OT and speech following, await palliative care to provide long-term goals of care as her long-term prognosis appears very poor.  2.  Incidental COVID-19 infection.  Incidental continue to monitor.  3.  Hypertension and recurrent ascites.  Requires weekly paracentesis for therapeutic purposes, lactulose and Xifaxan as above,  therapeutic paracentesis ordered on 12/30/2020 with albumin.  Long-term prognosis appears poor involve palliative care.  4.  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  Type screen done, PPI, no signs of active bleeding, monitor.  5.  Multiple remote fracture deformities of the thoracolumbar spine and left hemipelvis. Superimposed subacute fracture of the left inferior pubic ramus.  PT-OT, weightbearing as tolerated and monitor.  Currently relatively symptom-free.  6. Chronic abdominal pain.  Supportive care.   7.  Possible right-sided loculated pleural effusion.  Asymptomatic outpatient  pulmonary follow-up.   8.  Severe protein calorie malnutrition and cachexia.  Supportive care.  Prognosis appears extremely poor.  Placed on protein supplements       Condition -  Guarded  Family Communication  : Husband  [name] [phone] (? Wrong #) & [phone] on 12/30/2020 - 11.04 am no response.  Code Status : Full  Consults  :  IR  PUD Prophylaxis : PPI   Procedures  :      CT -  Stable bilateral pleural effusions. Cirrhosis with changes of portal venous hypertension with mild splenomegaly, small portosystemic collaterals, and large volume ascites. Ascites has slightly increased in overall volume when compared to prior examination. Marked bowel wall thickening involving the cecum and ascending colon. 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 Cholelithiasis. Bilateral ovarian cysts or better assessed on prior MRI examination of 11/17/2019, and demonstrate interval decrease in size from remote prior examination of 07/07/2018, confirming a benign etiology. Multiple remote fracture deformities of the thoracolumbar spine and left hemipelvis. Superimposed subacute fracture of the left inferior pubic ramus      Disposition Plan  :    Status  is: Inpatient  Remains inpatient appropriate because:IV treatments appropriate due to intensity of illness or inability to take PO   Dispo: The patient is from: Home              Anticipated d/c is to: Home              Patient currently is not medically stable to d/c.   Difficult to place patient No  DVT Prophylaxis  :  SCDs    Lab Results  Component Value Date   PLT 50 (L) 01/01/2021   Lab Results  Component Value Date   INR 1.8 (H) 12/30/2020   INR 1.6 (H) 12/29/2020   INR 1.8 (H) 11/02/2020    Diet :  Diet Order            DIET DYS 2 Room service appropriate? No; Fluid consistency: Nectar Thick  Diet effective now                  Inpatient Medications  Scheduled Meds: . feeding supplement  237 mL Oral BID BM  . ferrous sulfate  325 mg Oral Q breakfast  . furosemide  40 mg Oral Daily  . lactose free nutrition  237 mL Oral TID WC  . lactulose  30 g Oral TID  . levothyroxine  75 mcg Oral QAC breakfast  . multivitamin with minerals  1 tablet Oral Daily  . pantoprazole  40 mg Oral BID  . potassium chloride SA  40 mEq Oral Daily  . rifaximin  550 mg Oral BID  . sertraline  150 mg Oral Daily  . spironolactone  200 mg Oral Daily   Continuous Infusions: . albumin human     PRN Meds:.albumin human, diclofenac Sodium, ondansetron (ZOFRAN) IV, oxyCODONE, Resource ThickenUp Clear  Antibiotics  :    Anti-infectives (From admission, onward)   Start     Dose/Rate Route Frequency Ordered Stop   12/29/20 2200  rifaximin (XIFAXAN) tablet 550 mg        550 mg Oral 2 times daily 12/29/20 1822         Time Spent in minutes  30   Lala Lund M.D on 01/01/2021 at 10:44 AM  To page go to [email or web address]  [facility] -  Office  [phone]    See all Orders from today for further details    Objective:   Vitals:   12/31/20 1249 12/31/20 1407 12/31/20 2100 01/01/21 0345  BP: 104/66 119/82 135/73 (!) 149/64  Pulse:  82 79 85  Resp: 20 14 15 20   Temp:   98.2 F (36.8 C) 98 F (36.7 C) 98.2 F (36.8 C)  TempSrc:  Oral Axillary Oral  SpO2:  98% 98% 97%  Weight:      Height:        Wt Readings from Last 3 Encounters:  12/29/20 56.1 kg  12/23/20 54.4 kg  12/09/20 52.4 kg     Intake/Output Summary (Last 24 hours) at 01/01/2021 1044 Last data filed at 01/01/2021 0500 Gross per 24 hour  Intake 220 ml  Output 1300 ml  Net -1080 ml     Physical Exam  Awake Alert, No new F.N deficits, extremely frail and cachectic [place].AT,PERRAL Supple Neck,No JVD, No cervical lymphadenopathy appriciated.  Symmetrical Chest wall movement, Good air movement bilaterally, CTAB RRR,No Gallops, Rubs or new Murmurs, No Parasternal Heave +ve B.Sounds, Abd less distended, No tenderness,   No Cyanosis, Clubbing or edema, No new Rash or bruise     Data Review:    CBC Recent Labs  Lab 12/29/20 1505 12/30/20 0400 12/31/20 0123 01/01/21 0219  WBC 6.6 4.0 5.4 6.5  HGB 9.0* 7.4* 7.2* 8.5*  HCT 29.9* 24.1* 23.7* 28.0*  PLT 47* 34* 40* 50*  MCV 91.2 90.6 89.1 90.0  MCH 27.4 27.8 27.1 27.3  MCHC 30.1 30.7 30.4 30.4  RDW 22.1* 21.8* 21.9* 21.5*  LYMPHSABS  --   --  0.6* 0.5*  MONOABS  --   --  0.8 1.0  EOSABS  --   --  0.0 0.0  BASOSABS  --   --  0.0 0.0    Recent Labs  Lab 12/29/20 1505 12/29/20 1653 12/29/20 1654 12/30/20 0400 12/30/20 0719 12/30/20 0723 12/31/20 0123 01/01/21 0219  NA 135  --   --  135  --   --  136 134*  K 4.3  --   --  4.6  --   --  4.2 4.1  CL 103  --   --  104  --   --  106 100  CO2 18*  --   --  22  --   --  21* 25  GLUCOSE 88  --   --  82  --   --  89 130*  BUN 11  --   --  14  --   --  11 8  CREATININE 0.73  --   --  0.80  --   --  0.76 0.80  CALCIUM 8.6*  --   --  8.5*  --   --  8.9 8.9  AST 44*  --   --  36  --   --  38 38  ALT 22  --   --  20  --   --  18 16  ALKPHOS 131*  --   --  99  --   --  96 94  BILITOT 4.3*  --   --  4.4*  --   --  4.5* 4.4*  ALBUMIN 2.9*  --   --  2.9*  --   --  3.2* 3.1*  MG   --   --   --   --  2.0  --  2.0 2.1  CRP  --   --   --   --   --   --  2.9* 4.3*  INR  --   --  1.6* 1.8*  --   --   --   --   AMMONIA  --  75*  --   --   --  86* 60* 71*  BNP  --   --   --   --  43.3  --  120.9* 72.8    ------------------------------------------------------------------------------------------------------------------ No results for input(s): CHOL, HDL, LDLCALC, TRIG, CHOLHDL, LDLDIRECT in the last 72 hours.  No results found for: HGBA1C ------------------------------------------------------------------------------------------------------------------ No results for input(s): TSH, T4TOTAL, T3FREE, THYROIDAB in the last 72 hours.  Invalid input(s): FREET3  Cardiac Enzymes No results for input(s): CKMB, TROPONINI, MYOGLOBIN in the last 168 hours.  Invalid input(s): CK ------------------------------------------------------------------------------------------------------------------    Component Value Date/Time   BNP 72.8 01/01/2021 0219    Micro Results Recent Results (from the past 240 hour(s))  Gram stain     Status: None   Collection Time: 12/29/20  5:18 PM   Specimen: Peritoneal Washings; Peritoneal Fluid  Result Value Ref Range Status   Specimen Description PERITONEAL  Final   Special Requests NONE  Final   Gram Stain   Final    WBC PRESENT,BOTH PMN AND MONONUCLEAR NO ORGANISMS SEEN CYTOSPIN SMEAR Performed at [facility], [address] [place], [place] [address]    Report Status 12/29/2020 FINAL  Final  Culture, body fluid w Gram Stain-bottle     Status: None (Preliminary result)   Collection Time: 12/29/20  5:38 PM   Specimen: Peritoneal Washings  Result Value Ref Range Status   Specimen Description PERITONEAL  Final   Special Requests PERITONEAL FLD  Final   Culture   Final    NO GROWTH 2 DAYS Performed at [facility], [place] [address] [place], [place] [address]    Report Status PENDING  Incomplete  Resp Panel by RT-PCR (Flu A&B,  Covid) Nasopharyngeal Swab     Status: Abnormal   Collection Time: 12/29/20  7:24 PM   Specimen: Nasopharyngeal Swab; Nasopharyngeal(NP) swabs in vial transport medium  Result Value Ref Range Status   SARS Coronavirus 2 by RT PCR POSITIVE (A) NEGATIVE Final    Comment: RESULT CALLED TO, READ BACK BY AND VERIFIED WITH: Y,PATTERSON @2014  12/29/20 EB (NOTE) SARS-CoV-2 target nucleic acids are DETECTED.  The SARS-CoV-2 RNA is generally detectable in upper respiratory specimens during the acute phase of infection. Positive results are indicative of the presence of the identified virus, but do not rule out bacterial infection or co-infection with other pathogens not detected by the test. Clinical correlation with patient history and other diagnostic information is necessary to determine patient infection status. The expected result is Negative.  Fact Sheet for Patients: EntrepreneurPulse.com.au  Fact Sheet for Healthcare Providers: IncredibleEmployment.be  This test is not yet approved or cleared by the [place] FDA and  has been authorized for detection and/or diagnosis of SARS-CoV-2 by FDA under an Emergency Use Authorization (EUA).  This EUA will remain in effect (meaning this test can be used ) for the duration of  the COVID-19 declaration under Section 564(b)(1) of the Act, 21 U.S.C. section 360bbb-3(b)(1), unless the authorization is terminated or revoked sooner.     Influenza A by PCR NEGATIVE NEGATIVE Final   Influenza B by PCR NEGATIVE NEGATIVE Final    Comment: (NOTE) The Xpert Xpress SARS-CoV-2/FLU/RSV plus assay is intended  as an aid in the diagnosis of influenza from Nasopharyngeal swab specimens and should not be used as a sole basis for treatment. Nasal washings and aspirates are unacceptable for Xpert Xpress SARS-CoV-2/FLU/RSV testing.  Fact Sheet for Patients: EntrepreneurPulse.com.au  Fact Sheet for  Healthcare Providers: IncredibleEmployment.be  This test is not yet approved or cleared by the [place] FDA and has been authorized for detection and/or diagnosis of SARS-CoV-2 by FDA under an Emergency Use Authorization (EUA). This EUA will remain in effect (meaning this test can be used) for the duration of the COVID-19 declaration under Section 564(b)(1) of the Act, 21 U.S.C. section 360bbb-3(b)(1), unless the authorization is terminated or revoked.  Performed at [facility], [place] [address], [place], [place] [address]     Radiology Reports CT Head Wo Contrast  Result Date: 12/23/2020 CLINICAL DATA:  Status post fall. EXAM: CT HEAD WITHOUT CONTRAST TECHNIQUE: Contiguous axial images were obtained from the base of the skull through the vertex without intravenous contrast. COMPARISON:  August 13, 2020 FINDINGS: Brain: There is mild cerebral atrophy with widening of the extra-axial spaces and ventricular dilatation. There are areas of decreased attenuation within the white matter tracts of the supratentorial brain, consistent with microvascular disease changes. Vascular: No hyperdense vessel or unexpected calcification. Skull: Normal. Negative for fracture or focal lesion. Sinuses/Orbits: No acute finding. Other: None. IMPRESSION: 1. Mild cerebral atrophy. 2. No acute intracranial abnormality. Electronically Signed   By: Virgina Norfolk M.D.   On: 12/23/2020 23:05   CT Cervical Spine Wo Contrast  Result Date: 12/23/2020 CLINICAL DATA:  Status post fall. EXAM: CT CERVICAL SPINE WITHOUT CONTRAST TECHNIQUE: Multidetector CT imaging of the cervical spine was performed without intravenous contrast. Multiplanar CT image reconstructions were also generated. COMPARISON:  July 07, 2020 FINDINGS: Alignment: Approximately 1 mm to 2 mm anterolisthesis of the C4 vertebral body is noted on C5. Approximately 2 mm retrolisthesis of the C5 vertebral body is noted on C6.  Skull base and vertebrae: No acute fracture. No primary bone lesion or focal pathologic process. Soft tissues and spinal canal: No prevertebral fluid or swelling. No visible canal hematoma. Disc levels: Moderate severity endplate sclerosis is seen at the levels of C4-C5, C5-C6 and C6-C7. Mild intervertebral disc space narrowing is present at the levels of C2-C3 and C3-C4. Marked severity intervertebral disc space narrowing is seen at the levels of C4-C5, C5-C6 and C6-C7. Bilateral moderate severity multilevel facet joint hypertrophy is noted. Upper chest: Negative. Other: None. IMPRESSION: 1. Marked severity multilevel degenerative changes, as described above. 2. Approximately 1 mm to 2 mm anterolisthesis of the C4 vertebral body on C5. 3. Approximately 2 mm retrolisthesis of the C5 vertebral body on C6. 4. No acute cervical spine fracture. Electronically Signed   By: Virgina Norfolk M.D.   On: 12/23/2020 23:08   CT ABDOMEN PELVIS W CONTRAST  Result Date: 12/30/2020 CLINICAL DATA:  Acute, nonlocalized abdominal pain EXAM: CT ABDOMEN AND PELVIS WITH CONTRAST TECHNIQUE: Multidetector CT imaging of the abdomen and pelvis was performed using the standard protocol following bolus administration of intravenous contrast. CONTRAST:  37m OMNIPAQUE IOHEXOL 300 MG/ML  SOLN COMPARISON:  12/23/2020 FINDINGS: Lower chest: Small left pleural effusion and loculated right lateral basal pleural effusion are unchanged. Cardiac size within normal limits. Bilateral breast implants are partially visualized. Hepatobiliary: Cirrhosis. No enhancing intrahepatic mass. No intrahepatic or extrahepatic biliary ductal dilation. Cholelithiasis again noted. Pancreas: Unremarkable Spleen: Stable mild splenomegaly Adrenals/Urinary Tract: The adrenal glands are unremarkable. The  kidneys are normal in size and position. Previously noted right hydronephrosis has resolved. Stable nonobstructing 13 mm right renal pelvic calculus. No additional  intrarenal or ureteral calculi. The bladder is unremarkable. Stomach/Bowel: Moderate stool within the distal colon and rectal vault without evidence of obstruction. Stable marked bowel wall thickening involving the cecum and ascending colon in keeping with changes of portal colopathy, or infectious or inflammatory colitis. The stomach and small bowel are unremarkable. No evidence of obstruction. Large volume ascites is again noted, slightly increased in volume since prior examination. No free intraperitoneal gas. Vascular/Lymphatic: Several small gastroesophageal varices are identified as well as recanalization of the umbilical vein in keeping with changes of portal venous hypertension. The abdominal vasculature is otherwise unremarkable. Reproductive: Stable simple appearing cysts within the ovaries bilaterally measuring 2.4 cm on the right and 1.7 cm on the left. While stable since prior examination, these are decreased in size when compared to remote prior examination of 07/07/2018 in keeping with a benign etiology. Uterus unremarkable. Other: Diffuse subcutaneous body wall edema again noted in keeping with anasarca. Diffuse body wall wasting again noted. Musculoskeletal: Left hip ORIF noted. Subacute fracture of the left inferior pubic ramus is again noted in addition to remote fracture of this structure slightly anteriorly. Remote fracture deformities of the a left superior pubic ramus and left iliac crest are again identified. Compression deformities of T12, L1, and L5 are again identified and are stable from remote prior examinations dating as far back as 07/07/2018. No acute bone abnormality. IMPRESSION: Stable bilateral pleural effusions. Cirrhosis with changes of portal venous hypertension with mild splenomegaly, small portosystemic collaterals, and large volume ascites. Ascites has slightly increased in overall volume when compared to prior examination. Marked bowel wall thickening involving the cecum and  ascending colon. 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 Cholelithiasis. Bilateral ovarian cysts or better assessed on prior MRI examination of 11/17/2019, and demonstrate interval decrease in size from remote prior examination of 07/07/2018, confirming a benign etiology. Multiple remote fracture deformities of the thoracolumbar spine and left hemipelvis. Superimposed subacute fracture of the left inferior pubic ramus. Electronically Signed   By: Fidela Salisbury MD   On: 12/30/2020 03:22   CT ABDOMEN PELVIS W CONTRAST  Result Date: 12/23/2020 CLINICAL DATA:  Abdominal distension EXAM: CT ABDOMEN AND PELVIS WITH CONTRAST TECHNIQUE: Multidetector CT imaging of the abdomen and pelvis was performed using the standard protocol following bolus administration of intravenous contrast. CONTRAST:  66m OMNIPAQUE IOHEXOL 300 MG/ML  SOLN COMPARISON:  11/02/2020 CT, 09/03/2020, 08/15/2020 FINDINGS: Lower chest: Lung bases demonstrate partially visualized bilateral breast implants. Loculated fluid collection along the right lateral chest measures 8 x 2.7 cm and is unchanged in size. Small left-sided pleural effusion with dependent atelectasis. Hepatobiliary: Cirrhosis of the liver. Multiple calcified gallstones. No biliary dilatation Pancreas: Unremarkable. No pancreatic ductal dilatation or surrounding inflammatory changes. Spleen: Enlarged, measuring 16 cm craniocaudad. Adrenals/Urinary Tract: Adrenal glands are normal. Kidneys show minimal right hydronephrosis with stable 14 mm right pelvic stone. Subcentimeter hypodensity within the mid left kidney too small to further characterize. The bladder is unremarkable Stomach/Bowel: The stomach is nonenlarged. No dilated small bowel. Worsened diffuse colon wall thickening, most evident involving the right colon, transverse colon and descending colon. Vascular/Lymphatic:  Nonaneurysmal aorta. Subcentimeter retroperitoneal nodes. Small gastroesophageal and perirectal varices. Recanalized paraumbilical vein. Reproductive: Uterus unremarkable. Stable 2.1 cm right adnexal cyst and 1.3 cm  left adnexal cyst. Other: No free air. Moderate to large volume of ascites within the abdomen and pelvis. Generalized body wall edema with subcutaneous fluid. Musculoskeletal: Chronic compression deformity of L5 and at the superior endplates of I95 and L1. Chronic fracture deformity of the sacrum. Old fracture deformity of the left iliac bone. Chronic fracture deformity of left inferior pubic ramus. Acute to subacute fracture deformity of left inferior pubic ramus slightly more posterior to the chronic deformity. IMPRESSION: 1. 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 Moderate to large volume of ascites within the abdomen and pelvis. Similar small left-sided pleural effusion. Generalized subcutaneous fluid and edema consistent with anasarca. 2. Slight worsening of diffuse colon wall thickening which could be due to portal colopathy although colitis could also produce this appearance. 3. Multiple chronic fracture deformities. There is an acute to subacute appearing fracture involving the left inferior pubic ramus posterior to a chronic fracture deformity. 4. Multiple gallstones 5. Stable right renal pelvis calcification with minimal right hydronephrosis. 6. Stable bilateral ovarian cysts measuring up to 2.1 cm. No follow-up imaging recommended. Note: This recommendation does not apply to premenarchal patients and to those with increased risk (genetic, family history, elevated tumor markers or other high-risk factors) of ovarian cancer. Reference: JACR 2020 Feb; 17(2):248-254 Electronically Signed   By: Donavan Foil M.D.   On: 12/23/2020 23:14   DG Chest Port 1 View  Result Date: 12/30/2020 CLINICAL DATA:  Shortness of breath and altered mental status. EXAM: PORTABLE CHEST 1 VIEW  COMPARISON:  09/08/2020 FINDINGS: Subtle opacity in the right upper lung may be fluid in a fissure. There is focal nodular opacity in the peripheral right base. Low volume film with chronic underlying interstitial coarsening. Cardiopericardial silhouette is at upper limits of normal for size. The visualized bony structures of the thorax show no acute abnormality. Telemetry leads overlie the chest. IMPRESSION: Opacity at the right lung base compatible with loculated pleural fluid seen on CT abdomen earlier today. There is probably some fluid in the right major fissure is well. CT chest could be used to further evaluate as clinically warranted. Electronically Signed   By: Misty Stanley M.D.   On: 12/30/2020 13:09   XR HIP UNILAT W OR W/O PELVIS 2-3 VIEWS LEFT  Result Date: 12/04/2020 AP pelvis lateral left hip obtained demonstrating a prior hip nailing in good position.  No evidence of a hip fracture.  There are some fracture line visible about the greater trochanter but they look old.  There is also a prior superior and inferior pubic rami fracture on the left.  Compared to films performed previously there did not appear to be any change.  There was some bulky changes about the iliac crest on the left but are similar to the prior films.  I do not see any acute changes on today's films  XR Ankle 2 Views Left  Result Date: 12/04/2020 Films of the right ankle obtained in 2 projections no acute changes.  Diffuse osteopenia.  Prior os calcis fracture appears to have healed with some collapse but there is no pain.  No soft tissue swelling  IR Paracentesis  Result Date: 12/31/2020 INDICATION: Abdominal distention. Recurrent ascites. Request for therapeutic paracentesis up to 6 L max. EXAM: ULTRASOUND GUIDED LEFT LOWER QUADRANT PARACENTESIS MEDICATIONS: 1% plain lidocaine, 5 mL COMPLICATIONS: None immediate. PROCEDURE: Informed written consent was obtained from the patient after a discussion of the risks, benefits  and alternatives to treatment. A timeout was performed prior  to the initiation of the procedure. Initial ultrasound scanning demonstrates a large amount of ascites within the left lower abdominal quadrant. The left lower abdomen was prepped and draped in the usual sterile fashion. 1% lidocaine was used for local anesthesia. Following this, a 19 gauge, 7-cm, Yueh catheter was introduced. An ultrasound image was saved for documentation purposes. The paracentesis was performed. The catheter was removed and a dressing was applied. The patient tolerated the procedure well without immediate post procedural complication. FINDINGS: A total of approximately 6 L of clear yellow fluid was removed. IMPRESSION: Successful ultrasound-guided paracentesis yielding 6 liters of peritoneal fluid. Read by: Ascencion Dike PA-C Electronically Signed   By: Miachel Roux M.D.   On: 12/31/2020 12:15   IR Paracentesis  Result Date: 12/18/2020 INDICATION: Recurrent ascites EXAM: ULTRASOUND GUIDED therapeutic PARACENTESIS MEDICATIONS: 10 mL 1% lidocaine COMPLICATIONS: None immediate. PROCEDURE: Informed written consent was obtained from the patient after a discussion of the risks, benefits and alternatives to treatment. A timeout was performed prior to the initiation of the procedure. Initial ultrasound scanning demonstrates a large amount of ascites within the left lower abdominal quadrant. The left lower abdomen was prepped and draped in the usual sterile fashion. 1% lidocaine was used for local anesthesia. Following this, a 19 gauge, 7-cm, Yueh catheter was introduced. An ultrasound image was saved for documentation purposes. The paracentesis was performed. The catheter was removed and a dressing was applied. The patient tolerated the procedure well without immediate post procedural complication. Patient received post-procedure intravenous albumin; see nursing notes for details. FINDINGS: A total of approximately 6.2 L of clear yellow  fluid was removed. Samples were not sent to the laboratory as requested by the clinical team. IMPRESSION: Successful ultrasound-guided paracentesis yielding 6.2 liters of peritoneal fluid. Read by: Durenda Guthrie, PA-C Electronically Signed   By: Jacqulynn Cadet M.D.   On: 12/18/2020 11:23   IR Paracentesis  Result Date: 12/12/2020 INDICATION: Patient with history of NASH cirrhosis, recurrent ascites; request received for therapeutic paracentesis up to 10 liters. EXAM: ULTRASOUND GUIDED THERAPEUTIC  PARACENTESIS MEDICATIONS: 1% lidocaine to skin/ subcutaneous tissue COMPLICATIONS: None immediate. PROCEDURE: Informed written consent was obtained from the patient after a discussion of the risks, benefits and alternatives to treatment. A timeout was performed prior to the initiation of the procedure. Initial ultrasound scanning demonstrates a large amount of ascites within the left mid to lower abdominal quadrant. The left mid to lower abdomen was prepped and draped in the usual sterile fashion. 1% lidocaine was used for local anesthesia. Following this, a 19 gauge, 7-cm, Yueh catheter was introduced. An ultrasound image was saved for documentation purposes. The paracentesis was performed. The catheter was removed and a dressing was applied. The patient tolerated the procedure well without immediate post procedural complication. Patient received post-procedure intravenous albumin; see nursing notes for details. FINDINGS: A total of approximately 5.2 liters of yellow fluid was removed. IMPRESSION: Successful ultrasound-guided therapeutic paracentesis yielding 5.2 liters of peritoneal fluid. Read by: Nash Mantis Electronically Signed   By: Corrie Mckusick D.O.   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  COMPLICATIONS: None immediate. PROCEDURE: Informed written consent was obtained from the patient after a discussion of the risks, benefits and alternatives to treatment. A timeout was performed prior to the initiation of the procedure. Initial ultrasound scanning demonstrates a large  amount of ascites within the left lower abdominal quadrant. The left lower abdomen was prepped and draped in the usual sterile fashion. 1% lidocaine was used for local anesthesia. Following this, a 19 gauge, 7-cm, Yueh catheter was introduced. An ultrasound image was saved for documentation purposes. The paracentesis was performed. The catheter was removed and a dressing was applied. The patient tolerated the procedure well without immediate post procedural complication. Patient received post-procedure intravenous albumin; see nursing notes for details. FINDINGS: A total of approximately 8 liters of clear yellow fluid was removed. IMPRESSION: Successful ultrasound-guided paracentesis yielding 8 liters of peritoneal fluid. Read by: Durenda Guthrie, PA-C Electronically Signed   By: Jacqulynn Cadet M.D.   On: 12/05/2020 14:20

## 2021-01-01 NOTE — Consult Note (Signed)
Consultation Note Date: 01/01/2021   Patient Name: Megan Roberson  DOB: Feb 09, 1956  MRN: [id number]  Age / Sex: 65 y.o., female   PCP: Tammi Sou, MD Referring Physician: Thurnell Lose, MD   REASON FOR CONSULTATION:Establishing goals of care  Palliative Care consult requested for goals of care discussion in this 65 y.o. 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 Patient presented to the ED from home with complaints of altered mental status. Since admission patient is s/p paracentesis 12/31/20 yielding 6L. Patient incidentally found to be COVID-19 positive.    Clinical Assessment and Goals of Care: I have reviewed medical records including lab results, imaging, Epic notes, and MAR.  I spoke with Megan Roberson to discuss diagnosis prognosis, GOC, EOL wishes, disposition and options.  Patient is well known to our Palliative team due to previous admission involvement. Last visit on 09/04/2020.  I re-introduced Palliative Medicine as specialized medical care for people living with serious illness. It focuses on providing relief from the symptoms and stress of a serious illness. The goal is to improve quality of life for both the patient and the family. Patient verbalized understanding and remembrance of previous visits.   We discussed a brief life review of the patient, along with her functional and nutritional status. She shares she and her husband have been married for more than 20 years and have 2 children. She moved to [place] over 4 years ago from [place].   Megan Roberson reports a decline in her overall health and functional states for months. She reports her appetite has been  "pretty bad" due to abdominal discomfort and decreased appetite. She is now going almost weekly for paracentesis. She reports over the past few weeks-months she has not been ambulating much and when she does someone has to assist her due to increased weakness and gait instability. She has recurrent falls.   We discussed Her current illness and what it means in the larger context of Her on-going co-morbidities. Natural disease trajectory and expectations at EOL were discussed.  A detailed discussion was had today regarding advanced directives.  Concepts specific to code status, artifical feeding and hydration, continued IV antibiotics and rehospitalization. The difference between a aggressive medical intervention and a palliative comfort care path were discussed at length. Values and goals of care important to patient and family were attempted to be elicited.   Megan Roberson states she is not ready to "give up" and is requesting full code/full aggressive interventions despite discussions regarding poor prognosis.    I discussed the importance of continued conversation with family in the setting of her co-morbidities and terminal condition.   Outpatient Palliative was referred during admission in November 2021. Patient remains interested in their support.   Questions and concerns were addressed. Patient was encouraged to call with questions or concerns.  PMT will continue to support holistically as needed.   CODE STATUS: Full code  ADVANCE DIRECTIVES: Primary Decision Maker: Patient and husband if patient is unable to speak for herself.    SYMPTOM MANAGEMENT: per attending   Palliative Prophylaxis:   Aspiration, Bowel Regimen, Eye Care, Frequent Pain Assessment, Oral Care and Turn Reposition  PSYCHO-SOCIAL/SPIRITUAL:  Support System: Family  Desire for further Chaplaincy support:No   Additional Recommendations (Limitations, Scope, Preferences):  Full Scope Treatment  Education on  hospice/palliative    PAST MEDICAL HISTORY: Past Medical History:  Diagnosis Date  . Anxiety and depression   . Cholelithiasis without cholecystitis   . Chronic abdominal pain   . Chronic nausea    + abd pain  . Cirrhosis of liver with ascites (HCC)    Alc cirrhosis; hx of multiple abd paracentesis, +esoph varices (+banding), pancytopenia, hx of hepatic enceph, hx of hydrothorax  . Esophageal candidiasis ([place]) 11/2020   Dr. Noreene Larsson (difluc contraind b/c prolonged QT)  . Heart murmur    Echo 10/06/17 [facility]): Normal LV/RV size, basal septal hypertophy-normal variant, LVEF 65-70%, normal LV/RV function, est RAP 5 mmHg, no sign valvular stenosis or regurg--trace MR/TR/PR  . History of abnormal cervical Pap smear   . History of pneumonia   . Hypothyroidism   . Insomnia   . Nephrolithiasis    right renal pelvis, 13-54m nonobst stone  . Neuropathy    unclear hx  . Osteoarthritis, multiple sites    primarily hands and knees  . Osteoporosis 02/09/2018  . Ovarian cyst    bilat.  Stable on MRI pelvis 11/2019 and CT abd/pelv 09/2020--no f/u imaging indicated.  . Pancytopenia ([facility]   . Prolonged QT interval   . Splenomegaly   . Thrombocytopenia ([facility] 12/01/2019   severe, hx of plts down into 30s    ALLERGIES:  is allergic to penicillins, sulfa antibiotics, tramadol, and tylenol [acetaminophen].   MEDICATIONS:  Current Facility-Administered Medications  Medication Dose Route Frequency Provider Last Rate Last Admin  . albumin human 25 % solution 50 g  50 g Intravenous Daily PRN SThurnell Lose MD      . diclofenac Sodium (VOLTAREN) 1 % topical gel 2 g  2 g Topical Daily PRN ZLequita Halt MD   2 g at 12/30/20 2129  . feeding supplement (ENSURE ENLIVE / ENSURE PLUS) liquid 237 mL  237 mL Oral BID BM ZWynetta FinesT, MD   237 mL at 01/01/21 0903  . ferrous sulfate tablet 325 mg  325 mg Oral Q breakfast ZWynetta FinesT, MD   325 mg at 01/01/21 0901  . furosemide (LASIX) tablet  40 mg  40 mg Oral Daily ZWynetta FinesT, MD   40 mg at 01/01/21 0902  . lactose free nutrition (BOOST PLUS) liquid 237 mL  237 mL Oral TID WC SThurnell Lose MD   237 mL at 01/01/21 0903  . lactulose (CHRONULAC) 10 GM/15ML solution 30 g  30 g Oral TID SThurnell Lose MD   30 g at 01/01/21 0902  . levothyroxine (SYNTHROID) tablet 75 mcg  75 mcg Oral QAC breakfast ZWynetta FinesT, MD   75 mcg at 01/01/21 0545  . multivitamin with minerals tablet 1 tablet  1 tablet Oral Daily ZLequita Halt MD   1 tablet at 01/01/21 0902  . ondansetron (ZOFRAN) injection 4 mg  4 mg Intravenous Q6H PRN Chotiner, BYevonne Aline MD   4 mg at 12/30/20 1830  . oxyCODONE (Oxy IR/ROXICODONE) immediate release tablet 10 mg  10 mg Oral TID PRN SThurnell Lose MD   10 mg at 01/01/21 0340  . pantoprazole (PROTONIX) EC tablet 40 mg  40 mg Oral BID Pham, Minh Q, RPH-CPP   40 mg at 01/01/21 0902  . potassium chloride SA (KLOR-CON) CR tablet 40 mEq  40 mEq Oral Daily ZWynetta FinesT, MD   40 mEq at 01/01/21 0902  . Resource ThickenUp Clear  Oral PRN Thurnell Lose, MD      . rifaximin Doreene Nest) tablet 550 mg  550 mg Oral BID Lequita Halt, MD   550 mg at 01/01/21 0902  . sertraline (ZOLOFT) tablet 150 mg  150 mg Oral Daily Wynetta Fines T, MD   150 mg at 01/01/21 0902  . spironolactone (ALDACTONE) tablet 200 mg  200 mg Oral Daily Wynetta Fines T, MD   200 mg at 01/01/21 0901    VITAL SIGNS: BP (!) 149/64 (BP Location: Right Leg)   Pulse 85   Temp 98.2 F (36.8 C) (Oral)   Resp 20   Ht 5' 7"  (1.702 m)   Wt 56.1 kg   SpO2 97%   BMI 19.37 kg/m  Filed Weights   12/29/20 1441 12/29/20 2122  Weight: 61.2 kg 56.1 kg    Estimated body mass index is 19.37 kg/m as calculated from the following:   Height as of this encounter: 5' 7"  (1.702 m).   Weight as of this encounter: 56.1 kg.  LABS: CBC:    Component Value Date/Time   WBC 6.5 01/01/2021 0219   HGB 8.5 (L) 01/01/2021 0219   HCT 28.0 (L) 01/01/2021 0219   PLT 50 (L)  01/01/2021 0219   Comprehensive Metabolic Panel:    Component Value Date/Time   NA 134 (L) 01/01/2021 0219   K 4.1 01/01/2021 0219   BUN 8 01/01/2021 0219   CREATININE 0.80 01/01/2021 0219   ALBUMIN 3.1 (L) 01/01/2021 0219     Review of Systems  Gastrointestinal: Positive for abdominal pain.    Prognosis: POOR- MELD score =22 with 20% 3 month mortality, patient now requiring weekly-biweekly paracentesis which is also a poor prognosis indicator. Frail, weak, chronically ill  Discharge Planning:  To Be Determined  Recommendations: . Full Code-as confirmed by patient . Continue with current plan of care/full scope per patient's expressed wishes . Discussed at length concerns for poor prognosis and poor QOL. Patient verbalized understanding but states "not ready to give up".  . Encouraged ongoing discussions regarding wishes in the setting of terminal disease in the setting of not being a liver transplant candidate.  Marland Kitchen PMT will continue to support and follow as needed. Please call team line with urgent needs.   Palliative Performance Scale: PPS 20-30%                Patient expressed understanding and was in agreement with this plan.   Thank you for allowing the Palliative Medicine Team to assist in the care of this patient. Please utilize secure chat with additional questions, if there is no response within 30 minutes please call the above phone number.   Time In: 1215 Time Out: 1305 Time Total: 50 min.   Visit consisted of counseling and education dealing with the complex and emotionally intense issues of symptom management and palliative care in the setting of serious and potentially life-threatening illness.Greater than 50%  of this time was spent counseling and coordinating care related to the above assessment and plan.  Signed by:  Alda Lea, AGPCNP-BC Palliative Medicine Team  Phone: [phone] Pager: [phone] Amion: [place] Team providers are available by phone from 7am to 7pm daily and can be reached through the team cell phone.  Should this patient require assistance outside of these hours, please call the patient's attending physician.

## 2021-01-01 NOTE — Progress Notes (Signed)
Physical Therapy Treatment Patient Details Name: Megan Roberson MRN: [id number] DOB: [birth year] Today's Date: 01/01/2021    History of Present Illness Pt is a 65 y.o. female admitted 12/29/20 with AMS; workup consistent with severe hepatic encephalopathy due to noncompliance with lactulose; incidental (+) COVID-19. Pt also with multiple remote fx deformities of thorcolumbar spine and L hemipelvis (WBAT). S/p paracentesis 3/1. PMH includes NASH, portal HTN, recurrent ascites requiring weekly paracentesis, hepatic encephalopathy, chronic abdominal pain (oxycodone PRN), chronic thrombocytopenia.    PT Comments    Pt with limited ability to participate with PT due to lethargy and c/o nausea.  Pt did sit EOB and stood briefly with min A of 2 for safety.  Spouse was present and supportive/encouraging pt to participate.  Pt reports just doesn't feel like doing more.  Continue to progress as able.     Follow Up Recommendations  Home health PT;Supervision/Assistance - 24 hour     Equipment Recommendations  None recommended by PT (has DME)    Recommendations for Other Services       Precautions / Restrictions Precautions Precautions: Fall;Other (comment) Precaution Comments: skin tears/fragile skin Restrictions Other Position/Activity Restrictions: Per Dr. Candiss Norse - [facility] to [facility] for subacute pelvic fracture    Mobility  Bed Mobility Overal bed mobility: Needs Assistance Bed Mobility: Supine to Sit;Sit to Supine     Supine to sit: Min assist;+2 for safety/equipment Sit to supine: Min assist;+2 for safety/equipment   General bed mobility comments: Encouragement and assist to initiate.  Had Min A of 2 for safety based on last treatment.    Transfers Overall transfer level: Needs assistance Equipment used: 2 person hand held assist Transfers: Sit to/from Stand Sit to Stand: Min assist;+2 physical assistance         General transfer comment: Min A of 2 to rise from  bed  Ambulation/Gait Ambulation/Gait assistance: Min assist;+2 physical assistance Gait Distance (Feet): 2 Feet Assistive device: 2 person hand held assist Gait Pattern/deviations: Step-to pattern;Decreased stride length Gait velocity: decreased   General Gait Details: Side steps at EOB to get closer to [facility]; limited by lethargy/c/o nausea   Stairs             Wheelchair Mobility    Modified Rankin (Stroke Patients Only)       Balance Overall balance assessment: Needs assistance Sitting-balance support: No upper extremity supported;Feet supported Sitting balance-Leahy Scale: Fair Sitting balance - Comments: Sat EOB for ~3 mins; limited by nausea   Standing balance support: Bilateral upper extremity supported;During functional activity Standing balance-Leahy Scale: Poor Standing balance comment: Requiring support; only standing ~30 sec                            Cognition Arousal/Alertness: Lethargic Behavior During Therapy: Flat affect Overall Cognitive Status: Impaired/Different from baseline                                 General Comments: Spouse present and reports pt more lethargic than typical.  States she just received pain meds prior to PT and became lethargic after.    Spouse asked what he could do to help when PT not there.  Encouraged AROM exercises in bed, sitting EOB, or getting to chair with nursing assist.     Exercises      General Comments General comments (skin integrity, edema, etc.): Pt lethargic at arrival and had  just received meds.  Spouse was present and supportive.  Pt agreed to sitting EOB with encouragement from therapist and spouse.  Upon, sitting pt with nausea that did not resolve and wantint to lay back down.  Stood and took steps to [facility] but then returned to supine. Pt's spouse reports pt with chronic nausea.      Pertinent Vitals/Pain Pain Assessment: No/denies pain    Home Living                       Prior Function            PT Goals (current goals can now be found in the care plan section) Acute Rehab PT Goals Patient Stated Goal: to return home PT Goal Formulation: With patient/family Time For Goal Achievement: 01/14/21 Potential to Achieve Goals: Fair Progress towards PT goals: Progressing toward goals    Frequency    Min 3X/week      PT Plan Current plan remains appropriate    Co-evaluation              AM-PAC PT "6 Clicks" Mobility   Outcome Measure  Help needed turning from your back to your side while in a flat bed without using bedrails?: A Little Help needed moving from lying on your back to sitting on the side of a flat bed without using bedrails?: A Little Help needed moving to and from a bed to a chair (including a wheelchair)?: A Lot Help needed standing up from a chair using your arms (e.g., wheelchair or bedside chair)?: A Lot Help needed to walk in hospital room?: A Lot Help needed climbing 3-5 steps with a railing? : Total 6 Click Score: 13    End of Session   Activity Tolerance: Patient limited by fatigue Patient left: in bed;with call bell/phone within reach;with bed alarm set;with family/visitor present Nurse Communication: Mobility status PT Visit Diagnosis: Other abnormalities of gait and mobility (R26.89);Muscle weakness (generalized) (M62.81);Adult, failure to thrive (R62.7)     Time: 5830-9407 PT Time Calculation (min) (ACUTE ONLY): 16 min  Charges:  $Therapeutic Activity: 8-22 mins                     Abran Richard, PT Acute Rehab Services Pager [phone] [facility] [place] 01/01/2021, 4:29 PM

## 2021-01-02 ENCOUNTER — Ambulatory Visit: Payer: Medicare HMO | Admitting: Family Medicine

## 2021-01-02 ENCOUNTER — Other Ambulatory Visit: Payer: Self-pay | Admitting: Internal Medicine

## 2021-01-02 LAB — CBC WITH DIFFERENTIAL/PLATELET
Abs Immature Granulocytes: 0.11 10*3/uL — ABNORMAL HIGH (ref 0.00–0.07)
Basophils Absolute: 0 10*3/uL (ref 0.0–0.1)
Basophils Relative: 0 %
Eosinophils Absolute: 0 10*3/uL (ref 0.0–0.5)
Eosinophils Relative: 0 %
HCT: 31.2 % — ABNORMAL LOW (ref 36.0–46.0)
Hemoglobin: 9.2 g/dL — ABNORMAL LOW (ref 12.0–15.0)
Immature Granulocytes: 1 %
Lymphocytes Relative: 6 %
Lymphs Abs: 0.6 10*3/uL — ABNORMAL LOW (ref 0.7–4.0)
MCH: 27 pg (ref 26.0–34.0)
MCHC: 29.5 g/dL — ABNORMAL LOW (ref 30.0–36.0)
MCV: 91.5 fL (ref 80.0–100.0)
Monocytes Absolute: 1.2 10*3/uL — ABNORMAL HIGH (ref 0.1–1.0)
Monocytes Relative: 11 %
Neutro Abs: 8.9 10*3/uL — ABNORMAL HIGH (ref 1.7–7.7)
Neutrophils Relative %: 82 %
Platelets: 69 10*3/uL — ABNORMAL LOW (ref 150–400)
RBC: 3.41 MIL/uL — ABNORMAL LOW (ref 3.87–5.11)
RDW: 21.8 % — ABNORMAL HIGH (ref 11.5–15.5)
WBC: 10.9 10*3/uL — ABNORMAL HIGH (ref 4.0–10.5)
nRBC: 0 % (ref 0.0–0.2)

## 2021-01-02 LAB — COMPREHENSIVE METABOLIC PANEL
ALT: 20 U/L (ref 0–44)
AST: 37 U/L (ref 15–41)
Albumin: 3.2 g/dL — ABNORMAL LOW (ref 3.5–5.0)
Alkaline Phosphatase: 102 U/L (ref 38–126)
Anion gap: 10 (ref 5–15)
BUN: 12 mg/dL (ref 8–23)
CO2: 23 mmol/L (ref 22–32)
Calcium: 8.9 mg/dL (ref 8.9–10.3)
Chloride: 100 mmol/L (ref 98–111)
Creatinine, Ser: 0.88 mg/dL (ref 0.44–1.00)
GFR, Estimated: >60 mL/min (ref >60)
Glucose, Bld: 107 mg/dL — ABNORMAL HIGH (ref 70–99)
Potassium: 4.3 mmol/L (ref 3.5–5.1)
Sodium: 133 mmol/L — ABNORMAL LOW (ref 135–145)
Total Bilirubin: 5.9 mg/dL — ABNORMAL HIGH (ref 0.3–1.2)
Total Protein: 6.1 g/dL — ABNORMAL LOW (ref 6.5–8.1)

## 2021-01-02 LAB — MAGNESIUM: Magnesium: 2.2 mg/dL (ref 1.7–2.4)

## 2021-01-02 LAB — AMMONIA: Ammonia: 61 umol/L — ABNORMAL HIGH (ref 9–35)

## 2021-01-02 LAB — C-REACTIVE PROTEIN: CRP: 5.8 mg/dL — ABNORMAL HIGH (ref <1.0)

## 2021-01-02 LAB — BRAIN NATRIURETIC PEPTIDE: B Natriuretic Peptide: 26 pg/mL (ref 0.0–100.0)

## 2021-01-02 MED ORDER — AMLODIPINE BESYLATE 10 MG PO TABS
10.0000 mg | ORAL_TABLET | Freq: Every day | ORAL | Status: DC
  Administered 2021-01-02 – 2021-01-03 (×2): 10 mg via ORAL
  Filled 2021-01-02 (×2): qty 1

## 2021-01-02 MED ORDER — RIFAXIMIN 550 MG PO TABS: 550.0000 mg | ORAL_TABLET | Freq: Two times a day (BID) | ORAL | 0 refills | Status: DC

## 2021-01-02 MED FILL — XIFAXAN 550 MG TABLET: 550 | 30 days supply | Qty: 60 | Fill #0

## 2021-01-02 NOTE — Discharge Instructions (Signed)
Please call your physician and get your next paracentesis scheduled today.    Follow with Primary MD McGowen, Adrian Blackwater, MD in 7 days   Get CBC, CMP, 2 view Chest X ray -  checked next visit within 1 week by Primary MD   Activity: As tolerated with Full fall precautions use walker/cane & assistance as needed  Disposition Home    Diet: Soft diet with strict 1.5 L/day fluid restriction.   Special Instructions: If you have smoked or chewed Tobacco  in the last 2 yrs please stop smoking, stop any regular Alcohol  and or any Recreational drug use.  On your next visit with your primary care physician please Get Medicines reviewed and adjusted.  Please request your Prim.MD to go over all Hospital Tests and Procedure/Radiological results at the follow up, please get all Hospital records sent to your Prim MD by signing hospital release before you go home.  If you experience worsening of your admission symptoms, develop shortness of breath, life threatening emergency, suicidal or homicidal thoughts you must seek medical attention immediately by calling 911 or calling your MD immediately  if symptoms less severe.  You Must read complete instructions/literature along with all the possible adverse reactions/side effects for all the Medicines you take and that have been prescribed to you. Take any new Medicines after you have completely understood and accpet all the possible adverse reactions/side effects.        Marland Kitchen

## 2021-01-02 NOTE — TOC Transition Note (Addendum)
Transition of Care [facility]) - CM/SW Discharge Note   Patient Details  Name: Megan Roberson MRN: [id number] Date of Birth: Nov 07, 1955  [facility]) CM/SW Contact:  Pollie Friar, RN Phone Number: 01/02/2021, 11:17 AM   Clinical Narrative:    Recommendations for [facility] services. CM spoke to the patient over the phone and she had no preference for [facility] agency. Pt set up with [facility]. Cory with [facility] accepted the referral. CM asked bedside RN to send home some supplies for dressing changes as HH may be a day or 2 before seeing pt.  Pt states she has assistance at home and transportation to home.  1546; CM called and spoke to patient over the phone. She states she has spoken to her spouse and he is going to provided transport home.   Final next level of care: Home w Home Health Services Barriers to Discharge: No Barriers Identified   Patient Goals and CMS Choice   CMS Medicare.gov Compare Post Acute Care list provided to:: Patient Choice offered to / list presented to : Patient  Discharge Placement                       Discharge Plan and Services                          HH Arranged: RN,OT,PT [facility] Agency: [place] Date [place]: 01/02/21   Representative spoke with at [place]: [place]) Interventions     Readmission Risk Interventions Readmission Risk Prevention Plan 09/05/2020 08/14/2020  Transportation Screening Complete Complete  PCP or Specialist Appt within 3-5 Days Complete Not Complete  Not Complete comments - Anticipating discharge to SNF  [place] or [place] Complete Complete  Social Work Consult for [place] Planning/Counseling Complete Complete  Palliative Care Screening Complete Not Applicable  Medication Review Press photographer) Complete Complete  Some recent data might be hidden

## 2021-01-02 NOTE — Progress Notes (Signed)
Husband, Oluchi Pucci, called and informed of discharge order. States he will be here to pick her up at 0900 3/4. MD and patient updated.

## 2021-01-02 NOTE — Discharge Summary (Signed)
Megan Roberson JME:[id number] DOB: Apr 09, 1956 DOA: 12/29/2020  PCP: Tammi Sou, MD  Admit date: 12/29/2020  Discharge date: 01/02/2021  Admitted From: Home   Disposition:  Home   Recommendations for Outpatient Follow-up:   Follow up with PCP in 1-2 weeks  PCP Please obtain BMP/CBC, 2 view CXR in 1week,  (see Discharge instructions)   PCP Please follow up on the following pending results: Schedule outpatient paracentesis in the next 1 to 2 days, encouraged the patient to transition to comfort care, appears terminal.   Home Health: PT, RN, social work Equipment/Devices: Conservation officer, nature. Consultations: IR, palliative care Discharge Condition: Guarded   CODE STATUS: Full    Diet Recommendation: Soft diet with strict 1.5 L/day fluid restriction.  Diet Order            DIET DYS 2 Room service appropriate? No; Fluid consistency: Nectar Thick  Diet effective now                  Chief Complaint  Patient presents with  . 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                                                                 Hospital Course   1. Acute hepatic encephalopathy noncompliance  with lactulose, advanced underlying cirrhosis - she has been counseled on compliance with lactulose, has been placed on lactulose and Xifaxan, mentation has improved, he was counseled on compliance with lactulose, upon discharge we will give her 1 month prescription of Xifaxan if her insurance will approve, unfortunately at this point she appears to be in terminal condition due to her advanced liver cirrhosis.  Encouraged her to transition to comfort measures, seen by palliative care, patient currently resistant and wants aggressive treatment.  Extremely poor prognosis.  2.  Incidental COVID-19 infection.  Incidental asymptomatic.  3.  Hypertension and recurrent ascites.  Requires weekly paracentesis for therapeutic purposes, lactulose and Xifaxan as above, requested to schedule next paracentesis today through PCP.  4.  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    No signs of active bleeding.  5.  Multiple remote fracture deformities of the thoracolumbar spine and left hemipelvis. Superimposed subacute fracture of the left inferior pubic ramus.  PT-OT, weightbearing as tolerated  and monitor.  Currently relatively symptom-free.  6. Chronic abdominal pain.  Supportive care.  Home narcotic regimen continued upon discharge.   7.  Possible right-sided loculated pleural effusion.  Asymptomatic outpatient pulmonary follow-up.   8.  Severe protein calorie malnutrition and cachexia.  Supportive care.  Prognosis appears extremely poor.  Placed on protein supplements she was in the hospital.    Discharge diagnosis     Active Problems:   Acute hepatic encephalopathy   AMS (altered mental status)    Discharge instructions    Discharge Instructions    Discharge instructions   Complete by: As directed    Please call your physician and get your next paracentesis scheduled today.    Follow with Primary MD McGowen, Adrian Blackwater, MD in 7 days   Get  CBC, CMP, 2 view Chest X ray -  checked next visit within 1 week by Primary MD   Activity: As tolerated with Full fall precautions use walker/cane & assistance as needed  Disposition Home    Diet: Soft diet with strict 1.5 L/day fluid restriction.   Special Instructions: If you have smoked or chewed Tobacco  in the last 2 yrs please stop smoking, stop any regular Alcohol  and or any Recreational drug use.  On your next visit with your primary care physician please Get Medicines reviewed and adjusted.  Please request your Prim.MD to go over all Hospital Tests and Procedure/Radiological results at the follow up, please get all Hospital records sent to your Prim MD by signing hospital release before you go home.  If you experience worsening of your admission symptoms, develop shortness of breath, life threatening emergency, suicidal or homicidal thoughts you must seek medical attention immediately by calling 911 or calling your MD immediately  if symptoms less severe.  You Must read complete instructions/literature along with all the possible adverse reactions/side effects for all the Medicines you take and that have been prescribed to you. Take any new Medicines after you have completely understood and accpet all the possible adverse reactions/side effects.   Discharge wound care:   Complete by: As directed    Wound care  Every day Comments: Place saline moistened gauze on the wound on the LLE, secure with kerlex.   FOR ALL SKIN TEARS, apply vaseline gauze or Xeroform gauzes over the tears, secure with kerlex. Do NOT place any type of tape on the skin.  MOISTEN old dressings with saline prior to attempting to remove.  The patient's skin is VERY FRAGILE.  Use Adhesive remover wipes on EVERY dressing that is stuck to the patient's skin.   Increase activity slowly   Complete by: As directed       Discharge Medications   Allergies as of 01/02/2021      Reactions   Penicillins Swelling,  Other (See Comments)   FACIAL SWELLING Did it involve swelling of the face/tongue/throat, SOB, or low BP? Yes Did it involve sudden or severe rash/hives, skin peeling, or any reaction on the inside of your mouth or nose? No Did you need to seek medical attention at a hospital or doctor's office? Unknown When did it last happen?30 years If all above answers are "NO", may proceed with cephalosporin use.   Sulfa Antibiotics Itching, Rash   Tramadol Swelling, Rash   Tylenol [acetaminophen] Nausea Only      Medication List    TAKE these medications   Belsomra 10 MG Tabs Generic drug: Suvorexant 1 tab po about 30-60 min prior to  bedtime nightly   diclofenac Sodium 1 % Gel Commonly known as: VOLTAREN Apply 2 g topically daily as needed (for pain).   feeding supplement Liqd Take 237 mLs by mouth 2 (two) times daily between meals.   ferrous sulfate 325 (65 FE) MG EC tablet Take 1 tablet (325 mg total) by mouth in the morning and at bedtime.   lactulose 10 GM/15ML solution Commonly known as: CHRONULAC Take 30 mLs (20 g total) by mouth 3 (three) times daily. What changed:   when to take this  reasons to take this   levothyroxine 75 MCG tablet Commonly known as: SYNTHROID Take 1 tablet (75 mcg total) by mouth daily before breakfast.   multivitamin with minerals Tabs tablet Take 1 tablet by mouth daily.   omeprazole 40 MG capsule Commonly known as: PRILOSEC Take 1 capsule (40 mg total) by mouth 2 (two) times daily for 240 doses.   ondansetron 4 MG disintegrating tablet Commonly known as: Zofran ODT Take 1 tablet (4 mg total) by mouth every 8 (eight) hours as needed for nausea or vomiting.   Oxycodone HCl 10 MG Tabs 1 tab po tid prn pain What changed:   how much to take  how to take this  when to take this  reasons to take this   potassium chloride SA 20 MEQ tablet Commonly known as: KLOR-CON Take 2 tablets (40 mEq total) by mouth daily.   rifaximin 550 MG  Tabs tablet Commonly known as: XIFAXAN Take 1 tablet (550 mg total) by mouth 2 (two) times daily.   sertraline 100 MG tablet Commonly known as: ZOLOFT Take 100 mg by mouth daily.   sertraline 50 MG tablet Commonly known as: ZOLOFT Take 1 tablet (50 mg total) by mouth daily.   spironolactone 100 MG tablet Commonly known as: ALDACTONE Take 2 tablets (200 mg total) by mouth daily.            Durable Medical Equipment  (From admission, onward)         Start     Ordered   01/02/21 1236  For home use only DME Walker rolling  Once       Comments: 5 wheel  Question Answer Comment  Walker: With 5 Inch Wheels   Patient needs a walker to treat with the following condition Weakness      01/02/21 1235           Discharge Care Instructions  (From admission, onward)         Start     Ordered   01/02/21 0000  Discharge wound care:       Comments: Wound care  Every day Comments: Place saline moistened gauze on the wound on the LLE, secure with kerlex.   FOR ALL SKIN TEARS, apply vaseline gauze or Xeroform gauzes over the tears, secure with kerlex. Do NOT place any type of tape on the skin.  MOISTEN old dressings with saline prior to attempting to remove.  The patient's skin is VERY FRAGILE.  Use Adhesive remover wipes on EVERY dressing that is stuck to the patient's skin.   01/02/21 1231           Follow-up Information    [facility].   Specialty: Home Health Services Why: The home health agency will contact you for the first home visit. Contact information: [place] [address] [phone]        Tammi Sou, MD. Schedule an  appointment as soon as possible for a visit in 1 week(s).   Specialty: Family Medicine Why: Please schedule your next paracentesis today Contact information: [address] [place]  [address] [phone]               Major procedures and Radiology Reports -  PLEASE review detailed and final reports thoroughly  -      CT Head Wo Contrast  Result Date: 12/23/2020 CLINICAL DATA:  Status post fall. EXAM: CT HEAD WITHOUT CONTRAST TECHNIQUE: Contiguous axial images were obtained from the base of the skull through the vertex without intravenous contrast. COMPARISON:  August 13, 2020 FINDINGS: Brain: There is mild cerebral atrophy with widening of the extra-axial spaces and ventricular dilatation. There are areas of decreased attenuation within the white matter tracts of the supratentorial brain, consistent with microvascular disease changes. Vascular: No hyperdense vessel or unexpected calcification. Skull: Normal. Negative for fracture or focal lesion. Sinuses/Orbits: No acute finding. Other: None. IMPRESSION: 1. Mild cerebral atrophy. 2. No acute intracranial abnormality. Electronically Signed   By: Virgina Norfolk M.D.   On: 12/23/2020 23:05   CT Cervical Spine Wo Contrast  Result Date: 12/23/2020 CLINICAL DATA:  Status post fall. EXAM: CT CERVICAL SPINE WITHOUT CONTRAST TECHNIQUE: Multidetector CT imaging of the cervical spine was performed without intravenous contrast. Multiplanar CT image reconstructions were also generated. COMPARISON:  July 07, 2020 FINDINGS: Alignment: Approximately 1 mm to 2 mm anterolisthesis of the C4 vertebral body is noted on C5. Approximately 2 mm retrolisthesis of the C5 vertebral body is noted on C6. Skull base and vertebrae: No acute fracture. No primary bone lesion or focal pathologic process. Soft tissues and spinal canal: No prevertebral fluid or swelling. No visible canal hematoma. Disc levels: Moderate severity endplate sclerosis is seen at the levels of C4-C5, C5-C6 and C6-C7. Mild intervertebral disc space narrowing is present at the levels of C2-C3 and C3-C4. Marked severity intervertebral disc space narrowing is seen at the levels of C4-C5, C5-C6 and C6-C7. Bilateral moderate severity multilevel facet joint hypertrophy  is noted. Upper chest: Negative. Other: None. IMPRESSION: 1. Marked severity multilevel degenerative changes, as described above. 2. Approximately 1 mm to 2 mm anterolisthesis of the C4 vertebral body on C5. 3. Approximately 2 mm retrolisthesis of the C5 vertebral body on C6. 4. No acute cervical spine fracture. Electronically Signed   By: Virgina Norfolk M.D.   On: 12/23/2020 23:08   CT ABDOMEN PELVIS W CONTRAST  Result Date: 12/30/2020 CLINICAL DATA:  Acute, nonlocalized abdominal pain EXAM: CT ABDOMEN AND PELVIS WITH CONTRAST TECHNIQUE: Multidetector CT imaging of the abdomen and pelvis was performed using the standard protocol following bolus administration of intravenous contrast. CONTRAST:  50m OMNIPAQUE IOHEXOL 300 MG/ML  SOLN COMPARISON:  12/23/2020 FINDINGS: Lower chest: Small left pleural effusion and loculated right lateral basal pleural effusion are unchanged. Cardiac size within normal limits. Bilateral breast implants are partially visualized. Hepatobiliary: Cirrhosis. No enhancing intrahepatic mass. No intrahepatic or extrahepatic biliary ductal dilation. Cholelithiasis again noted. Pancreas: Unremarkable Spleen: Stable mild splenomegaly Adrenals/Urinary Tract: The adrenal glands are unremarkable. The kidneys are normal in size and position. Previously noted right hydronephrosis has resolved. Stable nonobstructing 13 mm right renal pelvic calculus. No additional intrarenal or ureteral calculi. The bladder is unremarkable. Stomach/Bowel: Moderate stool within the distal colon and rectal vault without evidence of obstruction. Stable marked bowel wall thickening involving the cecum and ascending colon in keeping with changes of portal colopathy, or infectious or  inflammatory colitis. The stomach and small bowel are unremarkable. No evidence of obstruction. Large volume ascites is again noted, slightly increased in volume since prior examination. No free intraperitoneal gas. Vascular/Lymphatic:  Several small gastroesophageal varices are identified as well as recanalization of the umbilical vein in keeping with changes of portal venous hypertension. The abdominal vasculature is otherwise unremarkable. Reproductive: Stable simple appearing cysts within the ovaries bilaterally measuring 2.4 cm on the right and 1.7 cm on the left. While stable since prior examination, these are decreased in size when compared to remote prior examination of 07/07/2018 in keeping with a benign etiology. Uterus unremarkable. Other: Diffuse subcutaneous body wall edema again noted in keeping with anasarca. Diffuse body wall wasting again noted. Musculoskeletal: Left hip ORIF noted. Subacute fracture of the left inferior pubic ramus is again noted in addition to remote fracture of this structure slightly anteriorly. Remote fracture deformities of the a left superior pubic ramus and left iliac crest are again identified. Compression deformities of T12, L1, and L5 are again identified and are stable from remote prior examinations dating as far back as 07/07/2018. No acute bone abnormality. IMPRESSION: Stable bilateral pleural effusions. Cirrhosis with changes of portal venous hypertension with mild splenomegaly, small portosystemic collaterals, and large volume ascites. Ascites has slightly increased in overall volume when compared to prior examination. Marked bowel wall thickening involving the cecum and ascending colon. 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 Cholelithiasis. Bilateral ovarian cysts or better assessed on prior MRI examination of 11/17/2019, and demonstrate interval decrease in size from remote prior examination of 07/07/2018, confirming a benign etiology. Multiple remote fracture deformities of the thoracolumbar spine and left hemipelvis. Superimposed subacute fracture of the left inferior pubic ramus.  Electronically Signed   By: Fidela Salisbury MD   On: 12/30/2020 03:22   CT ABDOMEN PELVIS W CONTRAST  Result Date: 12/23/2020 CLINICAL DATA:  Abdominal distension EXAM: CT ABDOMEN AND PELVIS WITH CONTRAST TECHNIQUE: Multidetector CT imaging of the abdomen and pelvis was performed using the standard protocol following bolus administration of intravenous contrast. CONTRAST:  64m OMNIPAQUE IOHEXOL 300 MG/ML  SOLN COMPARISON:  11/02/2020 CT, 09/03/2020, 08/15/2020 FINDINGS: Lower chest: Lung bases demonstrate partially visualized bilateral breast implants. Loculated fluid collection along the right lateral chest measures 8 x 2.7 cm and is unchanged in size. Small left-sided pleural effusion with dependent atelectasis. Hepatobiliary: Cirrhosis of the liver. Multiple calcified gallstones. No biliary dilatation Pancreas: Unremarkable. No pancreatic ductal dilatation or surrounding inflammatory changes. Spleen: Enlarged, measuring 16 cm craniocaudad. Adrenals/Urinary Tract: Adrenal glands are normal. Kidneys show minimal right hydronephrosis with stable 14 mm right pelvic stone. Subcentimeter hypodensity within the mid left kidney too small to further characterize. The bladder is unremarkable Stomach/Bowel: The stomach is nonenlarged. No dilated small bowel. Worsened diffuse colon wall thickening, most evident involving the right colon, transverse colon and descending colon. Vascular/Lymphatic: Nonaneurysmal aorta. Subcentimeter retroperitoneal nodes. Small gastroesophageal and perirectal varices. Recanalized paraumbilical vein. Reproductive: Uterus unremarkable. Stable 2.1 cm right adnexal cyst and 1.3 cm left adnexal cyst. Other: No free air. Moderate to large volume of ascites within the abdomen and pelvis. Generalized body wall edema with subcutaneous fluid. Musculoskeletal: Chronic compression deformity of L5 and at the superior endplates of TL87and L1. Chronic fracture deformity of the sacrum. Old fracture  deformity of the left iliac bone. Chronic fracture deformity of left inferior pubic ramus. Acute to subacute fracture  deformity of left inferior pubic ramus slightly more posterior to the chronic deformity. IMPRESSION: 1. 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 Moderate to large volume of ascites within the abdomen and pelvis. Similar small left-sided pleural effusion. Generalized subcutaneous fluid and edema consistent with anasarca. 2. Slight worsening of diffuse colon wall thickening which could be due to portal colopathy although colitis could also produce this appearance. 3. Multiple chronic fracture deformities. There is an acute to subacute appearing fracture involving the left inferior pubic ramus posterior to a chronic fracture deformity. 4. Multiple gallstones 5. Stable right renal pelvis calcification with minimal right hydronephrosis. 6. Stable bilateral ovarian cysts measuring up to 2.1 cm. No follow-up imaging recommended. Note: This recommendation does not apply to premenarchal patients and to those with increased risk (genetic, family history, elevated tumor markers or other high-risk factors) of ovarian cancer. Reference: JACR 2020 Feb; 17(2):248-254 Electronically Signed   By: Donavan Foil M.D.   On: 12/23/2020 23:14   DG Chest Port 1 View  Result Date: 12/30/2020 CLINICAL DATA:  Shortness of breath and altered mental status. EXAM: PORTABLE CHEST 1 VIEW COMPARISON:  09/08/2020 FINDINGS: Subtle opacity in the right upper lung may be fluid in a fissure. There is focal nodular opacity in the peripheral right base. Low volume film with chronic underlying interstitial coarsening. Cardiopericardial silhouette is at upper limits of normal for size. The visualized bony structures of the thorax show no acute abnormality. Telemetry leads overlie the chest. IMPRESSION: Opacity at the right lung base compatible with loculated pleural fluid seen on CT abdomen earlier today. There is  probably some fluid in the right major fissure is well. CT chest could be used to further evaluate as clinically warranted. Electronically Signed   By: Misty Stanley M.D.   On: 12/30/2020 13:09   XR HIP UNILAT W OR W/O PELVIS 2-3 VIEWS LEFT  Result Date: 12/04/2020 AP pelvis lateral left hip obtained demonstrating a prior hip nailing in good position.  No evidence of a hip fracture.  There are some fracture line visible about the greater trochanter but they look old.  There is also a prior superior and inferior pubic rami fracture on the left.  Compared to films performed previously there did not appear to be any change.  There was some bulky changes about the iliac crest on the left but are similar to the prior films.  I do not see any acute changes on today's films  XR Ankle 2 Views Left  Result Date: 12/04/2020 Films of the right ankle obtained in 2 projections no acute changes.  Diffuse osteopenia.  Prior os calcis fracture appears to have healed with some collapse but there is no pain.  No soft tissue swelling  IR Paracentesis  Result Date: 12/31/2020 INDICATION: Abdominal distention. Recurrent ascites. Request for therapeutic paracentesis up to 6 L max. EXAM: ULTRASOUND GUIDED LEFT LOWER QUADRANT PARACENTESIS MEDICATIONS: 1% plain lidocaine, 5 mL COMPLICATIONS: None immediate. PROCEDURE: Informed written consent was obtained from the patient after a discussion of the risks, benefits and alternatives to treatment. A timeout was performed prior to the initiation of the procedure. Initial ultrasound scanning demonstrates a large amount of ascites within the left lower abdominal quadrant. The left lower abdomen was prepped and draped in the usual sterile fashion. 1% lidocaine was used for local anesthesia. Following this, a 19 gauge, 7-cm, Yueh catheter was introduced. An ultrasound image was saved for documentation purposes. The paracentesis was performed. The catheter was removed  and a dressing was  applied. The patient tolerated the procedure well without immediate post procedural complication. FINDINGS: A total of approximately 6 L of clear yellow fluid was removed. IMPRESSION: Successful ultrasound-guided paracentesis yielding 6 liters of peritoneal fluid. Read by: Ascencion Dike PA-C Electronically Signed   By: Miachel Roux M.D.   On: 12/31/2020 12:15   IR Paracentesis  Result Date: 12/18/2020 INDICATION: Recurrent ascites EXAM: ULTRASOUND GUIDED therapeutic PARACENTESIS MEDICATIONS: 10 mL 1% lidocaine COMPLICATIONS: None immediate. PROCEDURE: Informed written consent was obtained from the patient after a discussion of the risks, benefits and alternatives to treatment. A timeout was performed prior to the initiation of the procedure. Initial ultrasound scanning demonstrates a large amount of ascites within the left lower abdominal quadrant. The left lower abdomen was prepped and draped in the usual sterile fashion. 1% lidocaine was used for local anesthesia. Following this, a 19 gauge, 7-cm, Yueh catheter was introduced. An ultrasound image was saved for documentation purposes. The paracentesis was performed. The catheter was removed and a dressing was applied. The patient tolerated the procedure well without immediate post procedural complication. Patient received post-procedure intravenous albumin; see nursing notes for details. FINDINGS: A total of approximately 6.2 L of clear yellow fluid was removed. Samples were not sent to the laboratory as requested by the clinical team. IMPRESSION: Successful ultrasound-guided paracentesis yielding 6.2 liters of peritoneal fluid. Read by: Durenda Guthrie, PA-C Electronically Signed   By: Jacqulynn Cadet M.D.   On: 12/18/2020 11:23   IR Paracentesis  Result Date: 12/12/2020 INDICATION: Patient with history of NASH cirrhosis, recurrent ascites; request received for therapeutic paracentesis up to 10 liters. EXAM: ULTRASOUND GUIDED THERAPEUTIC  PARACENTESIS  MEDICATIONS: 1% lidocaine to skin/ subcutaneous tissue COMPLICATIONS: None immediate. PROCEDURE: Informed written consent was obtained from the patient after a discussion of the risks, benefits and alternatives to treatment. A timeout was performed prior to the initiation of the procedure. Initial ultrasound scanning demonstrates a large amount of ascites within the left mid to lower abdominal quadrant. The left mid to lower abdomen was prepped and draped in the usual sterile fashion. 1% lidocaine was used for local anesthesia. Following this, a 19 gauge, 7-cm, Yueh catheter was introduced. An ultrasound image was saved for documentation purposes. The paracentesis was performed. The catheter was removed and a dressing was applied. The patient tolerated the procedure well without immediate post procedural complication. Patient received post-procedure intravenous albumin; see nursing notes for details. FINDINGS: A total of approximately 5.2 liters of yellow fluid was removed. IMPRESSION: Successful ultrasound-guided therapeutic paracentesis yielding 5.2 liters of peritoneal fluid. Read by: [facility] Electronically Signed   By: Corrie Mckusick D.O.   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 COMPLICATIONS: None immediate. PROCEDURE: Informed written consent was obtained from the patient after a discussion of the risks, benefits and alternatives to treatment. A timeout was performed prior to the initiation of the procedure. Initial ultrasound scanning demonstrates a large amount of ascites within the left lower abdominal quadrant. The left lower abdomen was prepped and draped in the usual sterile fashion. 1% lidocaine was used for local anesthesia. Following this, a 19 gauge, 7-cm, Yueh catheter was introduced.  An ultrasound image was saved for documentation purposes. The paracentesis was performed. The catheter was removed and a dressing was applied. The patient tolerated the procedure well  without immediate post procedural complication. Patient received post-procedure intravenous albumin; see nursing notes for details. FINDINGS: A total of approximately 8 liters of clear yellow fluid was removed. IMPRESSION: Successful ultrasound-guided paracentesis yielding 8 liters of peritoneal fluid. Read by: Durenda Guthrie, PA-C Electronically Signed   By: Jacqulynn Cadet M.D.   On: 12/05/2020 14:20    Micro Results     Recent Results (from the past 240 hour(s))  Gram stain     Status: None   Collection Time: 12/29/20  5:18 PM   Specimen: Peritoneal Washings; Peritoneal Fluid  Result Value Ref Range Status   Specimen Description PERITONEAL  Final   Special Requests NONE  Final   Gram Stain   Final    WBC PRESENT,BOTH PMN AND MONONUCLEAR NO ORGANISMS SEEN CYTOSPIN SMEAR Performed at [facility], [address], [place], [place] [address]    Report Status 12/29/2020 FINAL  Final  Culture, body fluid w Gram Stain-bottle     Status: None (Preliminary result)   Collection Time: 12/29/20  5:38 PM   Specimen: Peritoneal Washings  Result Value Ref Range Status   Specimen Description PERITONEAL  Final   Special Requests PERITONEAL FLD  Final   Culture   Final    NO GROWTH 3 DAYS Performed at [facility], [place] [address], [place], [place] [address]    Report Status PENDING  Incomplete  Resp Panel by RT-PCR (Flu A&B, Covid) Nasopharyngeal Swab     Status: Abnormal   Collection Time: 12/29/20  7:24 PM   Specimen: Nasopharyngeal Swab; Nasopharyngeal(NP) swabs in vial transport medium  Result Value Ref Range Status   SARS Coronavirus 2 by RT PCR POSITIVE (A) NEGATIVE Final    Comment: RESULT CALLED TO, READ BACK BY AND VERIFIED WITH: Y,PATTERSON @2014  12/29/20 EB (NOTE) SARS-CoV-2 target nucleic  acids are DETECTED.  The SARS-CoV-2 RNA is generally detectable in upper respiratory specimens during the acute phase of infection. Positive results are indicative of the presence of the identified virus, but do not rule out bacterial infection or co-infection with other pathogens not detected by the test. Clinical correlation with patient history and other diagnostic information is necessary to determine patient infection status. The expected result is Negative.  Fact Sheet for Patients: EntrepreneurPulse.com.au  Fact Sheet for Healthcare Providers: IncredibleEmployment.be  This test is not yet approved or cleared by the [place] FDA and  has been authorized for detection and/or diagnosis of SARS-CoV-2 by FDA under an Emergency Use Authorization (EUA).  This EUA will remain in effect (meaning this test can be used ) for the duration of  the COVID-19 declaration under Section 564(b)(1) of the Act, 21 U.S.C. section 360bbb-3(b)(1), unless the authorization is terminated or revoked sooner.     Influenza A by PCR NEGATIVE NEGATIVE Final   Influenza B by PCR NEGATIVE NEGATIVE Final    Comment: (NOTE) The Xpert Xpress SARS-CoV-2/FLU/RSV plus assay is intended as an aid in the diagnosis of influenza from Nasopharyngeal swab specimens and should not be used as a sole basis for treatment. Nasal washings and aspirates are unacceptable for Xpert Xpress SARS-CoV-2/FLU/RSV testing.  Fact Sheet for Patients: EntrepreneurPulse.com.au  Fact Sheet for Healthcare Providers: IncredibleEmployment.be  This test is not yet approved or cleared by the [place] FDA and has been authorized for detection and/or diagnosis of SARS-CoV-2 by FDA under an Emergency Use Authorization (EUA). This EUA will remain in effect (meaning this test can be used) for the duration of  the COVID-19 declaration under Section 564(b)(1) of  the Act, 21 U.S.C. section 360bbb-3(b)(1), unless the authorization is terminated or revoked.  Performed at [facility], [place] [address] [place], [place] [address]     Today   Subjective    Megan Roberson today has no headache,no chest abdominal pain,no new weakness tingling or numbness, feels much better wants to go home today.     Objective   Blood pressure (!) 150/75, pulse 80, temperature 98 F (36.7 C), temperature source Axillary, resp. rate 19, height 5' 7"  (1.702 m), weight 56.1 kg, SpO2 98 %.  No intake or output data in the 24 hours ending 01/02/21 1235  Exam  Awake Alert, No new F.N deficits, Normal affect .AT,PERRAL Supple Neck,No JVD, No cervical lymphadenopathy appriciated.  Symmetrical Chest wall movement, Good air movement bilaterally, CTAB RRR,No Gallops,Rubs or new Murmurs, No Parasternal Heave +ve B.Sounds, Abd Soft with moderate ascites, Non tender,   No Cyanosis, Clubbing or edema, No new Rash or bruise   Data Review   CBC w Diff:  Lab Results  Component Value Date   WBC 10.9 (H) 01/02/2021   HGB 9.2 (L) 01/02/2021   HCT 31.2 (L) 01/02/2021   PLT 69 (L) 01/02/2021   LYMPHOPCT 6 01/02/2021   MONOPCT 11 01/02/2021   EOSPCT 0 01/02/2021   BASOPCT 0 01/02/2021    CMP:  Lab Results  Component Value Date   NA 133 (L) 01/02/2021   K 4.3 01/02/2021   CL 100 01/02/2021   CO2 23 01/02/2021   BUN 12 01/02/2021   CREATININE 0.88 01/02/2021   PROT 6.1 (L) 01/02/2021   ALBUMIN 3.2 (L) 01/02/2021   BILITOT 5.9 (H) 01/02/2021   ALKPHOS 102 01/02/2021   AST 37 01/02/2021   ALT 20 01/02/2021  .   Total Time in preparing paper work, data evaluation and todays exam - 47 minutes  Lala Lund M.D on 01/02/2021 at 12:35 PM  [facility]

## 2021-01-02 NOTE — Care Management Important Message (Signed)
Important Message  Patient Details  Name: Megan Roberson MRN: [id number] Date of Birth: Oct 25, 1956   Medicare Important Message Given:  Yes - Important Message mailed due to current National Emergency   Verbal consent obtained due to current National Emergency  Relationship to patient: Self Contact Name: Emeli Goguen Call Date: 01/02/21  Time: 1443 Phone: [phone] Outcome: No Answer/Busy Important Message mailed to: Patient address on file    Delorse Lek 01/02/2021, 2:43 PM

## 2021-01-02 NOTE — Progress Notes (Signed)
Occupational Therapy Treatment Patient Details Name: Megan Roberson MRN: [id number] DOB: [birth year] Today's Date: 01/02/2021    History of present illness Pt is a 65 y.o. female admitted 12/29/20 with AMS; workup consistent with severe hepatic encephalopathy due to noncompliance with lactulose; incidental (+) COVID-19. Pt also with multiple remote fx deformities of thorcolumbar spine and L hemipelvis (WBAT). S/p paracentesis 3/1. PMH includes NASH, portal HTN, recurrent ascites requiring weekly paracentesis, hepatic encephalopathy, chronic abdominal pain (oxycodone PRN), chronic thrombocytopenia.   OT comments  Pt seen in conjunction with PT.  She requires min A for bed mobility and min A +2, progressing to min A +1 for toilet transfers.  She requires min - max A for ADLs. She is eager to discharge home.  Recommend 24 hour direct supervision/assist at discharge.   Follow Up Recommendations  Home health OT;Supervision/Assistance - 24 hour    Equipment Recommendations  3 in 1 bedside commode    Recommendations for Other Services      Precautions / Restrictions Precautions Precautions: Fall;Other (comment) Precaution Comments: skin tears/fragile skin; urinary/bowel incontinence Restrictions Other Position/Activity Restrictions: Per Dr. Candiss Norse - [place] to [facility] for subacute pelvic fracture       Mobility Bed Mobility Overal bed mobility: Needs Assistance Bed Mobility: Supine to Sit     Supine to sit: Min assist     General bed mobility comments: assist to initiate movement and assist to lift trunk    Transfers Overall transfer level: Needs assistance Equipment used: Rolling walker (2 wheeled) Transfers: Sit to/from [facility] Sit to Stand: Min assist;+2 safety/equipment Stand pivot transfers: Min assist;+2 safety/equipment       General transfer comment: Multiple sit<>stands from EOB, BSC and recliner, initially requiring minA+2 with BUE support, progressing to  minA+1 with RW; increased time and effort preparing to stand    Balance Overall balance assessment: Needs assistance Sitting-balance support: No upper extremity supported;Feet supported Sitting balance-Leahy Scale: Fair     Standing balance support: Bilateral upper extremity supported;During functional activity;Single extremity supported Standing balance-Leahy Scale: Poor Standing balance comment: Reliant on at least single UE support when performing pericare; requiring assist to complete pericare                           ADL either performed or assessed with clinical judgement   ADL Overall ADL's : Needs assistance/impaired                         Toilet Transfer: Minimal assistance;+2 for safety/equipment;Stand-pivot;RW;BSC Toilet Transfer Details (indicate cue type and reason): cues for hand placement, assist to stand, and assist to maneuver RW         Functional mobility during ADLs: Minimal assistance;Rolling walker;+2 for safety/equipment       Vision       Perception     Praxis      Cognition Arousal/Alertness: Awake/alert Behavior During Therapy: Flat affect Overall Cognitive Status: No family/caregiver present to determine baseline cognitive functioning Area of Impairment: Orientation;Following commands;Safety/judgement;Awareness;Problem solving;Memory;Attention                 Orientation Level: Disoriented to;Time Current Attention Level: Sustained   Following Commands: Follows one step commands with increased time Safety/Judgement: Decreased awareness of safety;Decreased awareness of deficits Awareness: Emergent Problem Solving: Slow processing;Decreased initiation;Difficulty sequencing;Requires verbal cues          Exercises     Shoulder Instructions  General Comments Pt eager to discharge home today    Pertinent Vitals/ Pain       Pain Assessment: Faces Faces Pain Scale: Hurts a little bit Pain Location:  Generalized Pain Descriptors / Indicators: Discomfort;Tiring Pain Intervention(s): Limited activity within patient's tolerance  Home Living                                          Prior Functioning/Environment              Frequency  Min 2X/week        Progress Toward Goals  OT Goals(current goals can now be found in the care plan section)  Progress towards OT goals: Progressing toward goals     Plan Discharge plan remains appropriate    Co-evaluation    PT/OT/SLP Co-Evaluation/Treatment: Yes Reason for Co-Treatment: For patient/therapist safety;To address functional/ADL transfers   OT goals addressed during session: ADL's and self-care      AM-PAC OT "6 Clicks" Daily Activity     Outcome Measure   Help from another person eating meals?: A Little Help from another person taking care of personal grooming?: A Little Help from another person toileting, which includes using toliet, bedpan, or urinal?: A Lot Help from another person bathing (including washing, rinsing, drying)?: A Lot Help from another person to put on and taking off regular upper body clothing?: A Lot Help from another person to put on and taking off regular lower body clothing?: A Lot 6 Click Score: 14    End of Session    OT Visit Diagnosis: Unsteadiness on feet (R26.81);Other abnormalities of gait and mobility (R26.89);Muscle weakness (generalized) (M62.81);Other symptoms and signs involving cognitive function;Pain   Activity Tolerance Patient tolerated treatment well   Patient Left in chair;with call bell/phone within reach;with chair alarm set   Nurse Communication Mobility status        Time: 5859-2924 OT Time Calculation (min): 25 min  Charges: OT General Charges $OT Visit: 1 Visit OT Treatments $Self Care/Home Management : 8-22 mins  Nilsa Nutting OTR/L Acute Rehabilitation Services Pager [phone] Office [phone]    Lucille Passy M 01/02/2021,  2:25 PM

## 2021-01-02 NOTE — Progress Notes (Signed)
Physical Therapy Treatment Patient Details Name: Megan Roberson MRN: [id number] DOB: Jul 07, 1956 Today's Date: 01/02/2021    History of Present Illness Pt is a 65 y.o. female admitted 12/29/20 with AMS; workup consistent with severe hepatic encephalopathy due to noncompliance with lactulose; incidental (+) COVID-19. Pt also with multiple remote fx deformities of thorcolumbar spine and L hemipelvis (WBAT). S/p paracentesis 3/1. PMH includes NASH, portal HTN, recurrent ascites requiring weekly paracentesis, hepatic encephalopathy, chronic abdominal pain (oxycodone PRN), chronic thrombocytopenia.   PT Comments    Pt preparing for d/c home this afternoon. Session focused on transfer training and attempting short ambulation distance with RW, pt requiring almost consistent minA for limited activity. Pt limited by generalized weakness, decreased activity tolerance, impaired balance and significant fatigue requiring frequent seated rest. Pt requires assist to complete seated and standing ADL tasks. Per chart, husband able to provide necessary assist upon return home. Continue to recommend HHPT services to maximize functional mobility and independence.    Follow Up Recommendations  Home health PT;Supervision/Assistance - 24 hour     Equipment Recommendations  None recommended by PT    Recommendations for Other Services       Precautions / Restrictions Precautions Precautions: Fall;Other (comment) Precaution Comments: skin tears/fragile skin; urinary/bowel incontinence Restrictions Other Position/Activity Restrictions: Per Dr. Candiss Norse - [place] to [facility] for subacute pelvic fracture    Mobility  Bed Mobility Overal bed mobility: Needs Assistance Bed Mobility: Supine to Sit     Supine to sit: Min assist     General bed mobility comments: Improved movement initiation, still requiring increased time and effort    Transfers Overall transfer level: Needs assistance Equipment used: 2 person hand held  assist;Rolling walker (2 wheeled) Transfers: Sit to/from Stand Sit to Stand: Min assist;+2 safety/equipment         General transfer comment: Multiple sit<>stands from EOB, BSC and recliner, initially requiring minA+2 with BUE support, progressing to minA+1 with RW; increased time and effort preparing to stand  Ambulation/Gait Ambulation/Gait assistance: Min assist;Min guard;+2 safety/equipment Gait Distance (Feet): 6 Feet Assistive device: 2 person hand held assist;Rolling walker (2 wheeled) Gait Pattern/deviations: Step-to pattern;Trunk flexed;Antalgic Gait velocity: Decreased Gait velocity interpretation: <1.31 ft/sec, indicative of household ambulator General Gait Details: Slow, fatigued gait, initially from bed to [facility] with minA for bilateral HHA; additional steps from BSC<>recliner with RW and min guard to minA, pt with bowel incontinence requiring return to [facility] briefly, then back to recliner; pt frequently requesting to sit requiring encouragement to continue mobilizing; cues for sequencing with RW   Stairs             Wheelchair Mobility    Modified Rankin (Stroke Patients Only)       Balance Overall balance assessment: Needs assistance Sitting-balance support: No upper extremity supported;Feet supported Sitting balance-Leahy Scale: Fair     Standing balance support: Bilateral upper extremity supported;During functional activity;Single extremity supported Standing balance-Leahy Scale: Poor Standing balance comment: Reliant on at least single UE support when performing pericare; requiring assist to complete pericare                            Cognition Arousal/Alertness: Awake/alert Behavior During Therapy: Flat affect Overall Cognitive Status: No family/caregiver present to determine baseline cognitive functioning Area of Impairment: Attention;Following commands;Safety/judgement;Awareness;Problem solving                   Current Attention  Level: Sustained   Following Commands: Follows  one step commands with increased time Safety/Judgement: Decreased awareness of safety;Decreased awareness of deficits Awareness: Emergent Problem Solving: Slow processing;Decreased initiation;Difficulty sequencing;Requires verbal cues        Exercises      General Comments General comments (skin integrity, edema, etc.): Pt preparing for d/c home today      Pertinent Vitals/Pain Pain Assessment: Faces Faces Pain Scale: Hurts a little bit Pain Location: Generalized Pain Descriptors / Indicators: Discomfort;Tiring Pain Intervention(s): Monitored during session;Limited activity within patient's tolerance    Home Living                      Prior Function            PT Goals (current goals can now be found in the care plan section) Progress towards PT goals: Progressing toward goals (slowly)    Frequency    Min 3X/week      PT Plan Current plan remains appropriate    Co-evaluation              AM-PAC PT "6 Clicks" Mobility   Outcome Measure  Help needed turning from your back to your side while in a flat bed without using bedrails?: A Little Help needed moving from lying on your back to sitting on the side of a flat bed without using bedrails?: A Little Help needed moving to and from a bed to a chair (including a wheelchair)?: A Little Help needed standing up from a chair using your arms (e.g., wheelchair or bedside chair)?: A Little Help needed to walk in hospital room?: A Lot Help needed climbing 3-5 steps with a railing? : Total 6 Click Score: 15    End of Session   Activity Tolerance: Patient limited by fatigue Patient left: in chair;with call bell/phone within reach;with chair alarm set Nurse Communication: Mobility status PT Visit Diagnosis: Other abnormalities of gait and mobility (R26.89);Muscle weakness (generalized) (M62.81);Adult, failure to thrive (R62.7)     Time: 7544-9201 PT Time  Calculation (min) (ACUTE ONLY): 22 min  Charges:  $Therapeutic Activity: 8-22 mins                    Mabeline Caras, PT, DPT Acute Rehabilitation Services  Pager [phone] Office [place] 01/02/2021, 2:20 PM

## 2021-01-03 ENCOUNTER — Inpatient Hospital Stay (HOSPITAL_COMMUNITY): Payer: Medicare HMO

## 2021-01-03 LAB — CBC WITH DIFFERENTIAL/PLATELET
Abs Immature Granulocytes: 0.13 10*3/uL — ABNORMAL HIGH (ref 0.00–0.07)
Basophils Absolute: 0 10*3/uL (ref 0.0–0.1)
Basophils Relative: 0 %
Eosinophils Absolute: 0 10*3/uL (ref 0.0–0.5)
Eosinophils Relative: 0 %
HCT: 28 % — ABNORMAL LOW (ref 36.0–46.0)
Hemoglobin: 8.6 g/dL — ABNORMAL LOW (ref 12.0–15.0)
Immature Granulocytes: 1 %
Lymphocytes Relative: 7 %
Lymphs Abs: 1 10*3/uL (ref 0.7–4.0)
MCH: 27.7 pg (ref 26.0–34.0)
MCHC: 30.7 g/dL (ref 30.0–36.0)
MCV: 90.3 fL (ref 80.0–100.0)
Monocytes Absolute: 1.5 10*3/uL — ABNORMAL HIGH (ref 0.1–1.0)
Monocytes Relative: 11 %
Neutro Abs: 10.7 10*3/uL — ABNORMAL HIGH (ref 1.7–7.7)
Neutrophils Relative %: 81 %
Platelets: 92 10*3/uL — ABNORMAL LOW (ref 150–400)
RBC: 3.1 MIL/uL — ABNORMAL LOW (ref 3.87–5.11)
RDW: 21.8 % — ABNORMAL HIGH (ref 11.5–15.5)
WBC: 13.4 10*3/uL — ABNORMAL HIGH (ref 4.0–10.5)
nRBC: 0.1 % (ref 0.0–0.2)

## 2021-01-03 LAB — COMPREHENSIVE METABOLIC PANEL
ALT: 19 U/L (ref 0–44)
AST: 43 U/L — ABNORMAL HIGH (ref 15–41)
Albumin: 3 g/dL — ABNORMAL LOW (ref 3.5–5.0)
Alkaline Phosphatase: 92 U/L (ref 38–126)
Anion gap: 13 (ref 5–15)
BUN: 19 mg/dL (ref 8–23)
CO2: 18 mmol/L — ABNORMAL LOW (ref 22–32)
Calcium: 8.8 mg/dL — ABNORMAL LOW (ref 8.9–10.3)
Chloride: 100 mmol/L (ref 98–111)
Creatinine, Ser: 0.96 mg/dL (ref 0.44–1.00)
GFR, Estimated: >60 mL/min (ref >60)
Glucose, Bld: 81 mg/dL (ref 70–99)
Potassium: 4.7 mmol/L (ref 3.5–5.1)
Sodium: 131 mmol/L — ABNORMAL LOW (ref 135–145)
Total Bilirubin: 5.3 mg/dL — ABNORMAL HIGH (ref 0.3–1.2)
Total Protein: 5.8 g/dL — ABNORMAL LOW (ref 6.5–8.1)

## 2021-01-03 LAB — CULTURE, BODY FLUID W GRAM STAIN -BOTTLE: Culture: NO GROWTH

## 2021-01-03 LAB — C-REACTIVE PROTEIN: CRP: 5.8 mg/dL — ABNORMAL HIGH (ref <1.0)

## 2021-01-03 LAB — MAGNESIUM: Magnesium: 2.3 mg/dL (ref 1.7–2.4)

## 2021-01-03 LAB — AMMONIA: Ammonia: 126 umol/L — ABNORMAL HIGH (ref 9–35)

## 2021-01-03 LAB — BRAIN NATRIURETIC PEPTIDE: B Natriuretic Peptide: 29.3 pg/mL (ref 0.0–100.0)

## 2021-01-03 MED ORDER — LIDOCAINE HCL 1 % IJ SOLN
INTRAMUSCULAR | Status: AC
  Filled 2021-01-03: qty 20

## 2021-01-03 MED ORDER — LIDOCAINE HCL 1 % IJ SOLN
INTRAMUSCULAR | Status: DC | PRN
  Administered 2021-01-03: 15 mL

## 2021-01-03 MED ORDER — ALBUMIN HUMAN 25 % IV SOLN
50.0000 g | Freq: Once | INTRAVENOUS | Status: DC | PRN
  Filled 2021-01-03: qty 200

## 2021-01-03 NOTE — Progress Notes (Signed)
[facility]                                                                                                                                                                         Patient Demographics  Megan Roberson, is a 65 y.o. female  RSW:[id number]  KXF:[id number]  DOB - 1956/11/01  Admit date - 12/29/2020  Admitting Physician Lequita Halt, MD  Outpatient Primary MD for the patient is McGowen, Adrian Blackwater, MD  LOS - 4   Chief Complaint  Patient presents with  . Altered Mental Status        Assessment & Plan    Patient seen briefly today due for discharge soon per Discharge done yesterday by me, and could not come here to pick her up yesterday, she was also due for her outpatient paracentesis today which will be done today at the hospital and then if no further issues, her husband will pick her up today.    Medications  Scheduled Meds: . amLODipine  10 mg Oral Daily  . feeding supplement  237 mL Oral BID BM  . ferrous sulfate  325 mg Oral Q breakfast  . furosemide  40 mg Oral Daily  . lactose free nutrition  237 mL Oral TID WC  . lactulose  30 g Oral TID  . levothyroxine  75 mcg Oral QAC breakfast  . multivitamin with minerals  1 tablet Oral Daily  . pantoprazole  40 mg Oral BID  . potassium chloride SA  40 mEq Oral Daily  . rifaximin  550 mg Oral BID  . sertraline  150 mg Oral Daily  . spironolactone  200 mg Oral Daily   Continuous Infusions: . albumin human     PRN Meds:.albumin human, diclofenac Sodium, ondansetron (ZOFRAN) IV, oxyCODONE, Resource ThickenUp Clear    Time Spent in minutes   10 minutes   Lala Lund M.D on 01/03/2021 at 10:35 AM  Between 7am to 7pm - Pager - [phone]  After 7pm go to [email or web address] - password [email or web address]  And look for the night coverage person covering for me after hours  [facility] Office  [phone]    Subjective:    Megan Roberson today has, No headache, No chest pain, No abdominal pain - No Nausea, No new weakness tingling or numbness, No Cough - SOB.    Objective:   Vitals:   01/01/21 2026 01/02/21 0422 01/02/21 2105 01/03/21 0601  BP: (!) 152/75 (!) 150/75 115/73 107/68  Pulse: 78 80 82 80  Resp: 18 19 20 17   Temp: 98 F (36.7 C) 98 F (36.7 C) 98 F (36.7 C) 98.1 F (36.7 C)  TempSrc: Axillary Axillary Axillary Axillary  SpO2: 99% 98% 98% 99%  Weight:      Height:        Wt Readings from Last 3 Encounters:  12/29/20 56.1 kg  12/23/20 54.4 kg  12/09/20 52.4 kg     Intake/Output Summary (Last 24 hours) at 01/03/2021 1035 Last data filed at 01/03/2021 0900 Gross per 24 hour  Intake 170 ml  Output 400 ml  Net -230 ml    Exam  Awake Alert, Oriented X 3, No new F.N deficits, Normal affect [place].AT,PERRAL Supple Neck,No JVD, No cervical lymphadenopathy appriciated.  Symmetrical Chest wall movement, Good air movement bilaterally, CTAB RRR,No Gallops,Rubs or new Murmurs, No Parasternal Heave +ve B.Sounds, Abd distended. No Cyanosis, Clubbing or edema, No new Rash or bruise     Data Review

## 2021-01-03 NOTE — Progress Notes (Addendum)
Per CM, Kelli, the patient's husband has been contacted. He agrees with the plan for the day and will be contacted after the paracentesis for discharge.

## 2021-01-03 NOTE — Progress Notes (Signed)
Patient was discharged home with husband via wheelchair. All belongings taken home. Medication provided from [facility]. AVS reviewed with patient and husband. IV d/c'd. On RA.

## 2021-01-03 NOTE — Progress Notes (Signed)
Ammonia level 126 this AM. Per night shift RN and MAR, patient refused lactulose 3/3 afternoon and 3/3 night. Patient stated that she "just can't stand the taste". Gave lactulose this AM and educated patient on the importance of taking her medications as well as the risks of having an ammonia level this high, especially after the level doubled overnight. Patient was agreeable.

## 2021-01-03 NOTE — Progress Notes (Signed)
Patient brought down to IR for paracentesis.  US showed minimal peritoneal fluid collection, no safe pocket was found to proceed.  [place] image saved for documentation.    [place] PA-C 01/03/2021 3:42 PM

## 2021-01-03 NOTE — Progress Notes (Signed)
Patient is en route to IR for paracentesis.

## 2021-01-03 NOTE — Progress Notes (Signed)
Attempted to call patient's husband this AM, but am receiving a message saying that this is not a working number. The patient has been unable to reach him as well.

## 2021-01-04 ENCOUNTER — Emergency Department (HOSPITAL_COMMUNITY): Payer: Medicare HMO

## 2021-01-04 ENCOUNTER — Inpatient Hospital Stay (HOSPITAL_COMMUNITY)
Admission: EM | Admit: 2021-01-04 | Discharge: 2021-01-08 | DRG: 441 | Disposition: A | Discharge status: Hospice | Payer: Medicare HMO | Attending: Internal Medicine | Admitting: Internal Medicine

## 2021-01-04 ENCOUNTER — Inpatient Hospital Stay (HOSPITAL_COMMUNITY): Payer: Medicare HMO

## 2021-01-04 DIAGNOSIS — K7682 Hepatic encephalopathy: Secondary | ICD-10-CM | POA: Diagnosis present

## 2021-01-04 DIAGNOSIS — Z79890 Hormone replacement therapy (postmenopausal): Secondary | ICD-10-CM | POA: Diagnosis not present

## 2021-01-04 DIAGNOSIS — Z66 Do not resuscitate: Secondary | ICD-10-CM | POA: Diagnosis not present

## 2021-01-04 DIAGNOSIS — Z515 Encounter for palliative care: Secondary | ICD-10-CM | POA: Diagnosis not present

## 2021-01-04 DIAGNOSIS — U071 COVID-19: Secondary | ICD-10-CM | POA: Diagnosis present

## 2021-01-04 DIAGNOSIS — Z781 Physical restraint status: Secondary | ICD-10-CM | POA: Diagnosis not present

## 2021-01-04 DIAGNOSIS — K7031 Alcoholic cirrhosis of liver with ascites: Secondary | ICD-10-CM | POA: Diagnosis present

## 2021-01-04 DIAGNOSIS — R64 Cachexia: Secondary | ICD-10-CM | POA: Diagnosis present

## 2021-01-04 DIAGNOSIS — J90 Pleural effusion, not elsewhere classified: Secondary | ICD-10-CM | POA: Diagnosis present

## 2021-01-04 DIAGNOSIS — Z882 Allergy status to sulfonamides status: Secondary | ICD-10-CM

## 2021-01-04 DIAGNOSIS — K766 Portal hypertension: Secondary | ICD-10-CM | POA: Diagnosis present

## 2021-01-04 DIAGNOSIS — Z9114 Patient's other noncompliance with medication regimen: Secondary | ICD-10-CM

## 2021-01-04 DIAGNOSIS — J9811 Atelectasis: Secondary | ICD-10-CM | POA: Diagnosis present

## 2021-01-04 DIAGNOSIS — E875 Hyperkalemia: Secondary | ICD-10-CM | POA: Diagnosis present

## 2021-01-04 DIAGNOSIS — R188 Other ascites: Secondary | ICD-10-CM | POA: Diagnosis present

## 2021-01-04 DIAGNOSIS — Z885 Allergy status to narcotic agent status: Secondary | ICD-10-CM | POA: Diagnosis not present

## 2021-01-04 DIAGNOSIS — E039 Hypothyroidism, unspecified: Secondary | ICD-10-CM | POA: Diagnosis present

## 2021-01-04 DIAGNOSIS — K7290 Hepatic failure, unspecified without coma: Secondary | ICD-10-CM | POA: Diagnosis present

## 2021-01-04 DIAGNOSIS — R9431 Abnormal electrocardiogram [ECG] [EKG]: Secondary | ICD-10-CM | POA: Diagnosis present

## 2021-01-04 DIAGNOSIS — Z880 Allergy status to penicillin: Secondary | ICD-10-CM

## 2021-01-04 DIAGNOSIS — D731 Hypersplenism: Secondary | ICD-10-CM | POA: Diagnosis present

## 2021-01-04 DIAGNOSIS — D6959 Other secondary thrombocytopenia: Secondary | ICD-10-CM | POA: Diagnosis present

## 2021-01-04 DIAGNOSIS — E43 Unspecified severe protein-calorie malnutrition: Secondary | ICD-10-CM | POA: Diagnosis present

## 2021-01-04 DIAGNOSIS — Z888 Allergy status to other drugs, medicaments and biological substances status: Secondary | ICD-10-CM | POA: Diagnosis not present

## 2021-01-04 DIAGNOSIS — Z79899 Other long term (current) drug therapy: Secondary | ICD-10-CM

## 2021-01-04 DIAGNOSIS — Z681 Body mass index (BMI) 19 or less, adult: Secondary | ICD-10-CM | POA: Diagnosis not present

## 2021-01-04 DIAGNOSIS — K7200 Acute and subacute hepatic failure without coma: Principal | ICD-10-CM | POA: Diagnosis present

## 2021-01-04 DIAGNOSIS — G8929 Other chronic pain: Secondary | ICD-10-CM | POA: Diagnosis present

## 2021-01-04 DIAGNOSIS — Z87442 Personal history of urinary calculi: Secondary | ICD-10-CM

## 2021-01-04 DIAGNOSIS — R140 Abdominal distension (gaseous): Secondary | ICD-10-CM | POA: Diagnosis present

## 2021-01-04 DIAGNOSIS — K7460 Unspecified cirrhosis of liver: Secondary | ICD-10-CM | POA: Diagnosis not present

## 2021-01-04 DIAGNOSIS — Z4659 Encounter for fitting and adjustment of other gastrointestinal appliance and device: Secondary | ICD-10-CM

## 2021-01-04 DIAGNOSIS — K7581 Nonalcoholic steatohepatitis (NASH): Secondary | ICD-10-CM | POA: Diagnosis present

## 2021-01-04 DIAGNOSIS — Z7189 Other specified counseling: Secondary | ICD-10-CM | POA: Diagnosis not present

## 2021-01-04 LAB — COMPREHENSIVE METABOLIC PANEL
ALT: 29 U/L (ref 0–44)
AST: 101 U/L — ABNORMAL HIGH (ref 15–41)
Albumin: 3.6 g/dL (ref 3.5–5.0)
Alkaline Phosphatase: 120 U/L (ref 38–126)
Anion gap: 19 — ABNORMAL HIGH (ref 5–15)
BUN: 19 mg/dL (ref 8–23)
CO2: 18 mmol/L — ABNORMAL LOW (ref 22–32)
Calcium: 9.6 mg/dL (ref 8.9–10.3)
Chloride: 98 mmol/L (ref 98–111)
Creatinine, Ser: 1.27 mg/dL — ABNORMAL HIGH (ref 0.44–1.00)
GFR, Estimated: 48 mL/min — ABNORMAL LOW (ref >60)
Glucose, Bld: 85 mg/dL (ref 70–99)
Potassium: 5.4 mmol/L — ABNORMAL HIGH (ref 3.5–5.1)
Sodium: 135 mmol/L (ref 135–145)
Total Bilirubin: 7 mg/dL — ABNORMAL HIGH (ref 0.3–1.2)
Total Protein: 6.7 g/dL (ref 6.5–8.1)

## 2021-01-04 LAB — CBC WITH DIFFERENTIAL/PLATELET
Abs Immature Granulocytes: 0.41 10*3/uL — ABNORMAL HIGH (ref 0.00–0.07)
Basophils Absolute: 0 10*3/uL (ref 0.0–0.1)
Basophils Relative: 0 %
Eosinophils Absolute: 0 10*3/uL (ref 0.0–0.5)
Eosinophils Relative: 0 %
HCT: 34.8 % — ABNORMAL LOW (ref 36.0–46.0)
Hemoglobin: 10.3 g/dL — ABNORMAL LOW (ref 12.0–15.0)
Immature Granulocytes: 2 %
Lymphocytes Relative: 5 %
Lymphs Abs: 1 10*3/uL (ref 0.7–4.0)
MCH: 27.5 pg (ref 26.0–34.0)
MCHC: 29.6 g/dL — ABNORMAL LOW (ref 30.0–36.0)
MCV: 93 fL (ref 80.0–100.0)
Monocytes Absolute: 1.7 10*3/uL — ABNORMAL HIGH (ref 0.1–1.0)
Monocytes Relative: 9 %
Neutro Abs: 15.4 10*3/uL — ABNORMAL HIGH (ref 1.7–7.7)
Neutrophils Relative %: 84 %
Platelets: 167 10*3/uL (ref 150–400)
RBC: 3.74 MIL/uL — ABNORMAL LOW (ref 3.87–5.11)
RDW: 22.4 % — ABNORMAL HIGH (ref 11.5–15.5)
WBC: 18.5 10*3/uL — ABNORMAL HIGH (ref 4.0–10.5)
nRBC: 0 % (ref 0.0–0.2)

## 2021-01-04 LAB — PROTIME-INR
INR: 1.6 — ABNORMAL HIGH (ref 0.8–1.2)
Prothrombin Time: 18.8 seconds — ABNORMAL HIGH (ref 11.4–15.2)

## 2021-01-04 LAB — RESP PANEL BY RT-PCR (FLU A&B, COVID) ARPGX2
Influenza A by PCR: NEGATIVE
Influenza B by PCR: NEGATIVE
SARS Coronavirus 2 by RT PCR: POSITIVE — AB

## 2021-01-04 LAB — AMMONIA: Ammonia: 253 umol/L — ABNORMAL HIGH (ref 9–35)

## 2021-01-04 MED ORDER — LEVOTHYROXINE SODIUM 75 MCG PO TABS: 75.0000 ug | ORAL_TABLET | Freq: Every day | ORAL | Status: DC

## 2021-01-04 MED ORDER — LACTULOSE ENEMA
300.0000 mL | Freq: Once | ORAL | Status: AC
  Administered 2021-01-04: 300 mL via RECTAL
  Filled 2021-01-04: qty 300

## 2021-01-04 MED ORDER — RIFAXIMIN 550 MG PO TABS
550.0000 mg | ORAL_TABLET | Freq: Two times a day (BID) | ORAL | Status: DC
  Administered 2021-01-05 (×2): 550 mg via ORAL
  Filled 2021-01-04 (×2): qty 1

## 2021-01-04 MED ORDER — FOLIC ACID 5 MG/ML IJ SOLN
1.0000 mg | Freq: Every day | INTRAMUSCULAR | Status: DC
  Filled 2021-01-04: qty 0.2

## 2021-01-04 MED ORDER — THIAMINE HCL 100 MG/ML IJ SOLN
100.0000 mg | Freq: Every day | INTRAMUSCULAR | Status: DC
  Administered 2021-01-05: 100 mg via INTRAVENOUS
  Filled 2021-01-04: qty 2

## 2021-01-04 MED ORDER — ONDANSETRON HCL 4 MG/2ML IJ SOLN: 4.0000 mg | Freq: Four times a day (QID) | INTRAMUSCULAR | Status: DC | PRN

## 2021-01-04 MED ORDER — LORAZEPAM 2 MG/ML IJ SOLN
1.0000 mg | Freq: Once | INTRAMUSCULAR | Status: AC
  Administered 2021-01-04: 1 mg via INTRAVENOUS
  Filled 2021-01-04: qty 1

## 2021-01-04 MED ORDER — ONDANSETRON HCL 4 MG PO TABS: 4.0000 mg | ORAL_TABLET | Freq: Four times a day (QID) | ORAL | Status: DC | PRN

## 2021-01-04 MED ORDER — SODIUM CHLORIDE 0.9 % IV BOLUS
1000.0000 mL | Freq: Once | INTRAVENOUS | Status: AC
  Administered 2021-01-04: 1000 mL via INTRAVENOUS

## 2021-01-04 NOTE — ED Notes (Signed)
Lactulose enema unsuccessful. Provider made aware.

## 2021-01-04 NOTE — H&P (Signed)
History and Physical   Megan Roberson EXN:[id number] DOB: [birth year] DOA: 01/04/2021  Referring MD/NP/PA: Dr Darl Householder  PCP: Tammi Sou, MD   Outpatient Specialists: Dr Enis Gash, GI   Patient coming from: Home  Chief Complaint: Altered mental status  HPI: Megan Roberson is a 65 y.o. female with medical history significant of end-stage liver disease, history of ACS, hypothyroidism, just discharged from the hospital after admission with hepatic encephalopathy.  Patient brought back by her husband due to altered mental status.  She has been confused currently obtunded.  Unable to respond to even pain.  She was apparently agitated earlier in the day prior to coming to the hospital.  She has markedly elevated ammonia level and appears to be having another episode of hepatic encephalopathy.  Patient's family specifically the husband wants full code on continued care.  During last hospitalization palliative care consult was offered to the family but declined.  Patient additionally was found to be COVID-19 positive.  She does not appear to be having any respiratory issues..  ED Course: Temperature 97.7 blood pressure 79/63 with pulse 107 respirate 27 oxygen sat 99% currently on room air.  White count is 18.5 hemoglobin 10.3 and platelets 167.  Sodium 135 potassium is 5.4 chloride 98 CO2 18 BUN 19 creatinine 1.27 calcium 9.6.  INR is 1.6 glucose 85.  COVID-19 is positive.  Chest x-ray shows subsegmental atelectasis at the left lung base.Marland Kitchen  Also chronic small loculated right pleural effusion.  EKG showed normal sinus rhythm with a rate of 81.  She has prolonged QTC of 508.  Otherwise no significant ST findings.  Review of Systems: As per HPI otherwise 10 point review of systems negative.    Past Medical History:  Diagnosis Date  . Anxiety and depression   . Cholelithiasis without cholecystitis   . Chronic abdominal pain   . Chronic nausea    + abd pain  . Cirrhosis of liver with ascites (HCC)     Alc cirrhosis; hx of multiple abd paracentesis, +esoph varices (+banding), pancytopenia, hx of hepatic enceph, hx of hydrothorax  . Esophageal candidiasis ([place]) 11/2020   Dr. Noreene Roberson (difluc contraind b/c prolonged QT)  . Heart murmur    Echo 10/06/17 [facility]): Normal LV/RV size, basal septal hypertophy-normal variant, LVEF 65-70%, normal LV/RV function, est RAP 5 mmHg, no sign valvular stenosis or regurg--trace MR/TR/PR  . History of abnormal cervical Pap smear   . History of pneumonia   . Hypothyroidism   . Insomnia   . Nephrolithiasis    right renal pelvis, 13-75m nonobst stone  . Neuropathy    unclear hx  . Osteoarthritis, multiple sites    primarily hands and knees  . Osteoporosis 02/09/2018  . Ovarian cyst    bilat.  Stable on MRI pelvis 11/2019 and CT abd/pelv 09/2020--no f/u imaging indicated.  . [place] [place]   . Prolonged QT interval   . Splenomegaly   . [place] 12/01[place]2019   severe, hx of plts down into 30s    Past Surgical History:  Procedure Laterality Date  . AUGMENTATION MAMMAPLASTY Bilateral    20 years ago  . BIOPSY  03/15/2018   gastric.  Procedure: BIOPSY;  Surgeon: AYetta Flock MD;  [facility];  Service: Gastroenterology;;  Gastric  . BIOPSY  11/14/2020   Procedure: BIOPSY;  Surgeon: AYetta Flock MD;  [facility];  Service: Gastroenterology;;  . COLONOSCOPY  2019  . COLONOSCOPY WITH ESOPHAGOGASTRODUODENOSCOPY (EGD) AND ESOPHAGEAL DILATION (ED)    .  COLONOSCOPY WITH PROPOFOL N/A 03/15/2018   Redundant colon, severe looping, very edematous colon c/w portal HTN.  Procedure: COLONOSCOPY WITH PROPOFOL;  Surgeon: Yetta Flock, MD;  Location[facility];  Service: Gastroenterology;  Laterality: N/A;  . ESOPHAGOGASTRODUODENOSCOPY  11/14/2020   esoph candidiasis, nonbleeding ulcer (path benign), portal hypertensive gastropathy.  No varices.  . ESOPHAGOGASTRODUODENOSCOPY (EGD) WITH PROPOFOL  N/A 03/15/2018   Gastritis, small gastric ulcer. Procedure: ESOPHAGOGASTRODUODENOSCOPY (EGD) WITH PROPOFOL;  Surgeon: Yetta Flock, MD;  [facility];  Service: Gastroenterology;  Laterality: N/A;  . ESOPHAGOGASTRODUODENOSCOPY (EGD) WITH PROPOFOL N/A 11/14/2020   Procedure: ESOPHAGOGASTRODUODENOSCOPY (EGD) WITH PROPOFOL;  Surgeon: Yetta Flock, MD;  [facility];  Service: Gastroenterology;  Laterality: N/A;  . ESOPHAGOGASTRODUODENOSCOPY W/ BANDING     Varices  . FRACTURE SURGERY Left    x 2 L elbow and hip left   . HARDWARE REMOVAL Left 09/17/2017   Procedure: HARDWARE REMOVAL LEFT OLECRANON;  Surgeon: Altamese Boalsburg, MD;  Location: [place];  Service: Orthopedics;  Laterality: Left;  . IR PARACENTESIS  11/18/2017  . IR PARACENTESIS  11/26/2017  . IR PARACENTESIS  03/14/2018  . IR PARACENTESIS  08/08/2020  . IR PARACENTESIS  09/23/2020  . IR PARACENTESIS  10/22/2020  . IR PARACENTESIS  10/30/2020  . IR PARACENTESIS  11/12/2020  . IR PARACENTESIS  11/20/2020  . IR PARACENTESIS  11/27/2020  . IR PARACENTESIS  12/05/2020  . IR PARACENTESIS  12/12/2020  . IR PARACENTESIS  12/18/2020  . IR PARACENTESIS  12/31/2020  . KNEE SURGERY Right    open incision for ligaments  . ORIF ELBOW FRACTURE Left 04/30/2017   Procedure: REVISION OPEN REDUCTION INTERNAL FIXATION (ORIF) ELBOW/OLECRANON FRACTURE;  Surgeon: Altamese Addison, MD;  Location: [place];  Service: Orthopedics;  Laterality: Left;  . ORIF HIP FRACTURE Left   . REPAIR EXTENSOR TENDON Left 12/29/2019   Procedure: LEFT LONG AND LEFT RING FRINGER EXTENSOR REALIGNAMENT WITH POSSIBLE TENDON TRANSFER;  Surgeon: Charlotte Crumb, MD;  Location: [place];  Service: Orthopedics;  Laterality: Left;  . TRANSTHORACIC ECHOCARDIOGRAM  05/19/2020   extremely poor acoustic window/technically poor study-->normal  . virtual colonoscopy  04/24/2019   no polyps, no mass, no apple core lesion, no stricture     reports that she has never  smoked. She has never used smokeless tobacco. She reports that she does not drink alcohol and does not use drugs.  Allergies  Allergen Reactions  . Penicillins Swelling and Other (See Comments)    FACIAL SWELLING Did it involve swelling of the face/tongue/throat, SOB, or low BP? Yes Did it involve sudden or severe rash/hives, skin peeling, or any reaction on the inside of your mouth or nose? No Did you need to seek medical attention at a hospital or doctor's office? Unknown When did it last happen?30 years If all above answers are "NO", may proceed with cephalosporin use.   . Sulfa Antibiotics Itching and Rash  . Tramadol Swelling and Rash  . Tylenol [Acetaminophen] Nausea Only    Family History  Adopted: Yes  Problem Relation Age of Onset  . Alcohol abuse Daughter      Prior to Admission medications   Medication Sig Start Date End Date Taking? Authorizing Provider  diclofenac Sodium (VOLTAREN) 1 % GEL Apply 2 g topically daily as needed (for pain). 04/23/20   [provider]  feeding supplement (ENSURE ENLIVE / ENSURE PLUS) LIQD Take 237 mLs by mouth 2 (two) times daily between meals. 09/08/20   Karleen Hampshire,  Jeoffrey Massed, MD  ferrous sulfate 325 (65 FE) MG EC tablet Take 1 tablet (325 mg total) by mouth in the morning and at bedtime. 09/17/20   Armbruster, Carlota Raspberry, MD  lactulose (CHRONULAC) 10 GM/15ML solution Take 30 mLs (20 g total) by mouth 3 (three) times daily. Patient taking differently: Take 20 g by mouth 3 (three) times daily as needed for mild constipation. 09/08/20   Hosie Poisson, MD  levothyroxine (SYNTHROID, LEVOTHROID) 75 MCG tablet Take 1 tablet (75 mcg total) by mouth daily before breakfast. 12/02/17   Aline August, MD  Multiple Vitamin (MULTIVITAMIN WITH MINERALS) TABS tablet Take 1 tablet by mouth daily. 09/09/20   Hosie Poisson, MD  omeprazole (PRILOSEC) 40 MG capsule Take 1 capsule (40 mg total) by mouth 2 (two) times daily for 240 doses. 11/14/20 03/14/21   Armbruster, Carlota Raspberry, MD  ondansetron (ZOFRAN ODT) 4 MG disintegrating tablet Take 1 tablet (4 mg total) by mouth every 8 (eight) hours as needed for nausea or vomiting. 11/20/20   McGowen, Adrian Blackwater, MD  Oxycodone HCl 10 MG TABS 1 tab po tid prn pain Patient taking differently: Take 10 mg by mouth 3 (three) times daily as needed. 1 tab po tid prn pain 12/09/20   McGowen, Adrian Blackwater, MD  potassium chloride SA (KLOR-CON) 20 MEQ tablet Take 2 tablets (40 mEq total) by mouth daily. 09/08/20   Hosie Poisson, MD  rifaximin (XIFAXAN) 550 MG TABS tablet Take 1 tablet (550 mg total) by mouth 2 (two) times daily. 01/02/21   Thurnell Lose, MD  sertraline (ZOLOFT) 100 MG tablet Take 100 mg by mouth daily. 08/17/20   [provider]  sertraline (ZOLOFT) 50 MG tablet Take 1 tablet (50 mg total) by mouth daily. 08/21/20   Nita Sells, MD  spironolactone (ALDACTONE) 100 MG tablet Take 2 tablets (200 mg total) by mouth daily. 09/09/20   Hosie Poisson, MD  Suvorexant (BELSOMRA) 10 MG TABS 1 tab po about 30-60 min prior to bedtime nightly 12/09/20   Tammi Sou, MD    Physical Exam: Vitals:   01/04/21 1915 01/04/21 1930 01/04/21 1945 01/04/21 2049  BP: 111/73 100/60 (!) 94/56 101/69  Pulse: 89 85 82 78  Resp: 17 15 15 15   Temp:    97.7 F (36.5 C)  TempSrc:    Axillary  SpO2: 100% 100% 100% 100%      Constitutional: Chronically ill looking, cachectic, in obvious distress Vitals:   01/04/21 1915 01/04/21 1930 01/04/21 1945 01/04/21 2049  BP: 111/73 100/60 (!) 94/56 101/69  Pulse: 89 85 82 78  Resp: 17 15 15 15   Temp:    97.7 F (36.5 C)  TempSrc:    Axillary  SpO2: 100% 100% 100% 100%   Eyes: PERRL, lids and conjunctivae Pale, Jaundice ENMT: Mucous membranes are dry. Posterior pharynx clear of any exudate or lesions.Normal dentition.  Neck: normal, supple, no masses, no thyromegaly Respiratory: decreased BS with crackles. Normal respiratory effort. No accessory muscle use.   Cardiovascular: Regular rate and rhythm, no murmurs / rubs / gallops. No extremity edema. 2+ pedal pulses. No carotid bruits.  Abdomen: Distended, no masses palpated. No hepatosplenomegaly. Bowel sounds positive.  Musculoskeletal: no clubbing / cyanosis. No joint deformity upper and lower extremities. Good ROM, no contractures. Normal muscle tone.  Skin: Acute on chronic multiple skin bruises Neurologic: CN 2-12 grossly intact. Sensation intact, DTR normal. Strength 5/5 in all 4.  Psychiatric: Completely obtunded.     Labs on Admission: I  have personally reviewed following labs and imaging studies  CBC: Recent Labs  Lab 12/31/20 0123 01/01/21 0219 01/02/21 0108 01/03/21 0111 01/04/21 1705  WBC 5.4 6.5 10.9* 13.4* 18.5*  NEUTROABS 3.9 5.0 8.9* 10.7* 15.4*  HGB 7.2* 8.5* 9.2* 8.6* 10.3*  HCT 23.7* 28.0* 31.2* 28.0* 34.8*  MCV 89.1 90.0 91.5 90.3 93.0  PLT 40* 50* 69* 92* 893   Basic Metabolic Panel: Recent Labs  Lab 12/30/20 0719 12/31/20 0123 01/01/21 0219 01/02/21 0108 01/03/21 0111 01/04/21 1705  NA  --  136 134* 133* 131* 135  K  --  4.2 4.1 4.3 4.7 5.4*  CL  --  106 100 100 100 98  CO2  --  21* 25 23 18* 18*  GLUCOSE  --  89 130* 107* 81 85  BUN  --  11 8 12 19 19   CREATININE  --  0.76 0.80 0.88 0.96 1.27*  CALCIUM  --  8.9 8.9 8.9 8.8* 9.6  MG 2.0 2.0 2.1 2.2 2.3  --    GFR: Estimated Creatinine Clearance: 40.7 mL/min (A) (by C-G formula based on SCr of 1.27 mg/dL (H)). Liver Function Tests: Recent Labs  Lab 12/31/20 0123 01/01/21 0219 01/02/21 0108 01/03/21 0111 01/04/21 1705  AST 38 38 37 43* 101*  ALT 18 16 20 19 29   ALKPHOS 96 94 102 92 120  BILITOT 4.5* 4.4* 5.9* 5.3* 7.0*  PROT 5.6* 5.6* 6.1* 5.8* 6.7  ALBUMIN 3.2* 3.1* 3.2* 3.0* 3.6   No results for input(s): LIPASE, AMYLASE in the last 168 hours. Recent Labs  Lab 12/31/20 0123 01/01/21 0219 01/02/21 0108 01/03/21 0111 01/04/21 1705  AMMONIA 60* 71* 61* 126* 253*   Coagulation  Profile: Recent Labs  Lab 12/29/20 1654 12/30/20 0400 01/04/21 1705  INR 1.6* 1.8* 1.6*   Cardiac Enzymes: No results for input(s): CKTOTAL, CKMB, CKMBINDEX, TROPONINI in the last 168 hours. BNP (last 3 results) No results for input(s): PROBNP in the last 8760 hours. HbA1C: No results for input(s): HGBA1C in the last 72 hours. CBG: Recent Labs  Lab 12/29/20 1616  GLUCAP 83   Lipid Profile: No results for input(s): CHOL, HDL, LDLCALC, TRIG, CHOLHDL, LDLDIRECT in the last 72 hours. Thyroid Function Tests: No results for input(s): TSH, T4TOTAL, FREET4, T3FREE, THYROIDAB in the last 72 hours. Anemia Panel: No results for input(s): VITAMINB12, FOLATE, FERRITIN, TIBC, IRON, RETICCTPCT in the last 72 hours. Urine analysis:    Component Value Date/Time   COLORURINE AMBER (A) 12/30/2020 1129   APPEARANCEUR HAZY (A) 12/30/2020 1129   LABSPEC 1.031 (H) 12/30/2020 1129   PHURINE 7.0 12/30/2020 1129   GLUCOSEU NEGATIVE 12/30/2020 1129   HGBUR LARGE (A) 12/30/2020 1129   BILIRUBINUR NEGATIVE 12/30/2020 1129   BILIRUBINUR postive 12/09/2020 1519   KETONESUR NEGATIVE 12/30/2020 1129   PROTEINUR 30 (A) 12/30/2020 1129   UROBILINOGEN 2.0 (A) 12/09/2020 1519   NITRITE NEGATIVE 12/30/2020 1129   LEUKOCYTESUR MODERATE (A) 12/30/2020 1129   Sepsis Labs: @LABRCNTIP (procalcitonin:4,lacticidven:4) ) Recent Results (from the past 240 hour(s))  Gram stain     Status: None   Collection Time: 12/29/20  5:18 PM   Specimen: Peritoneal Washings; Peritoneal Fluid  Result Value Ref Range Status   Specimen Description PERITONEAL  Final   Special Requests NONE  Final   Gram Stain   Final    WBC PRESENT,BOTH PMN AND MONONUCLEAR NO ORGANISMS SEEN CYTOSPIN SMEAR Performed at [facility], [address] [place], [place] [address]  Report Status 12/29/2020 FINAL  Final  Culture, body fluid w Gram Stain-bottle     Status: None   Collection Time: 12/29/20  5:38 PM   Specimen: Peritoneal  Washings  Result Value Ref Range Status   Specimen Description PERITONEAL  Final   Special Requests PERITONEAL FLD  Final   Culture   Final    NO GROWTH 5 DAYS Performed at [facility], [address], [place], [place] [address]    Report Status 01/03/2021 FINAL  Final  Resp Panel by RT-PCR (Flu A&B, Covid) Nasopharyngeal Swab     Status: Abnormal   Collection Time: 12/29/20  7:24 PM   Specimen: Nasopharyngeal Swab; Nasopharyngeal(NP) swabs in vial transport medium  Result Value Ref Range Status   SARS Coronavirus 2 by RT PCR POSITIVE (A) NEGATIVE Final    Comment: RESULT CALLED TO, READ BACK BY AND VERIFIED WITH: Y,PATTERSON @2014  12/29/20 EB (NOTE) SARS-CoV-2 target nucleic acids are DETECTED.  The SARS-CoV-2 RNA is generally detectable in upper respiratory specimens during the acute phase of infection. Positive results are indicative of the presence of the identified virus, but do not rule out bacterial infection or co-infection with other pathogens not detected by the test. Clinical correlation with patient history and other diagnostic information is necessary to determine patient infection status. The expected result is Negative.  Fact Sheet for Patients: EntrepreneurPulse.com.au  Fact Sheet for Healthcare Providers: IncredibleEmployment.be  This test is not yet approved or cleared by the [place] FDA and  has been authorized for detection and/or diagnosis of SARS-CoV-2 by FDA under an Emergency Use Authorization (EUA).  This EUA will remain in effect (meaning this test can be used ) for the duration of  the COVID-19 declaration under Section 564(b)(1) of the Act, 21 U.S.C. section 360bbb-3(b)(1), unless the authorization is terminated or revoked sooner.     Influenza A by PCR NEGATIVE NEGATIVE Final   Influenza B by PCR NEGATIVE NEGATIVE Final    Comment: (NOTE) The Xpert Xpress SARS-CoV-2/FLU/RSV plus assay is intended  as an aid in the diagnosis of influenza from Nasopharyngeal swab specimens and should not be used as a sole basis for treatment. Nasal washings and aspirates are unacceptable for Xpert Xpress SARS-CoV-2/FLU/RSV testing.  Fact Sheet for Patients: EntrepreneurPulse.com.au  Fact Sheet for Healthcare Providers: IncredibleEmployment.be  This test is not yet approved or cleared by the [place] FDA and has been authorized for detection and/or diagnosis of SARS-CoV-2 by FDA under an Emergency Use Authorization (EUA). This EUA will remain in effect (meaning this test can be used) for the duration of the COVID-19 declaration under Section 564(b)(1) of the Act, 21 U.S.C. section 360bbb-3(b)(1), unless the authorization is terminated or revoked.  Performed at [facility], [place] [address] [place], [place] [address]   Resp Panel by RT-PCR (Flu A&B, Covid) Nasopharyngeal Swab     Status: Abnormal   Collection Time: 01/04/21  4:52 PM   Specimen: Nasopharyngeal Swab; Nasopharyngeal(NP) swabs in vial transport medium  Result Value Ref Range Status   SARS Coronavirus 2 by RT PCR POSITIVE (A) NEGATIVE Final    Comment: RESULT CALLED TO, READ BACK BY AND VERIFIED WITH: D,SIDBURY @1841  01/04/21 EB (NOTE) SARS-CoV-2 target nucleic acids are DETECTED.  The SARS-CoV-2 RNA is generally detectable in upper respiratory specimens during the acute phase of infection. Positive results are indicative of the presence of the identified virus, but do not rule out bacterial infection or co-infection with other pathogens not  detected by the test. Clinical correlation with patient history and other diagnostic information is necessary to determine patient infection status. The expected result is Negative.  Fact Sheet for Patients: EntrepreneurPulse.com.au  Fact Sheet for Healthcare Providers: IncredibleEmployment.be  This test is  not yet approved or cleared by the [place] FDA and  has been authorized for detection and/or diagnosis of SARS-CoV-2 by FDA under an Emergency Use Authorization (EUA).  This EUA will remain in effect (meaning this test can be used) f or the duration of  the COVID-19 declaration under Section 564(b)(1) of the Act, 21 U.S.C. section 360bbb-3(b)(1), unless the authorization is terminated or revoked sooner.     Influenza A by PCR NEGATIVE NEGATIVE Final   Influenza B by PCR NEGATIVE NEGATIVE Final    Comment: (NOTE) The Xpert Xpress SARS-CoV-2/FLU/RSV plus assay is intended as an aid in the diagnosis of influenza from Nasopharyngeal swab specimens and should not be used as a sole basis for treatment. Nasal washings and aspirates are unacceptable for Xpert Xpress SARS-CoV-2/FLU/RSV testing.  Fact Sheet for Patients: EntrepreneurPulse.com.au  Fact Sheet for Healthcare Providers: IncredibleEmployment.be  This test is not yet approved or cleared by the [place] FDA and has been authorized for detection and/or diagnosis of SARS-CoV-2 by FDA under an Emergency Use Authorization (EUA). This EUA will remain in effect (meaning this test can be used) for the duration of the COVID-19 declaration under Section 564(b)(1) of the Act, 21 U.S.C. section 360bbb-3(b)(1), unless the authorization is terminated or revoked.  Performed at [facility], [place] [address] [place], [place] [address]      Radiological Exams on Admission: DG Chest Port 1 View  Result Date: 01/04/2021 CLINICAL DATA:  Altered mental status. Patient had difficulty tolerating the exam. EXAM: PORTABLE CHEST 1 VIEW COMPARISON:  Chest radiograph 12/30/2020. lung bases from abdominal CT 12/30/2020, Chest CT 08/16/2021 FINDINGS: Right costophrenic angle is excluded from the field of view despite 2 acquisitions. This includes the area of chronically loculated pleural fluid. Normal  heart size and mediastinal contours. Subsegmental atelectasis at the left lung base. Chronic interstitial coarsening, overall low lung volumes. No pneumothorax. Osseous structures are grossly intact. Remote left rib fractures. IMPRESSION: 1. Subsegmental atelectasis at the left lung base. 2. Chronic small loculated right pleural effusion is not included in the field of view, patient had difficulty tolerating the exam. Electronically Signed   By: Keith Rake M.D.   On: 01/04/2021 17:15   IR ABDOMEN US LIMITED  Result Date: 01/03/2021 CLINICAL DATA:  History of recurrent symptomatic abdominal ascites. EXAM: LIMITED ABDOMEN ULTRASOUND FOR ASCITES TECHNIQUE: Limited ultrasound survey for ascites was performed in all four abdominal quadrants. COMPARISON:  12/31/2020 FINDINGS: Survey ultrasound showed small volume abdominal ascites. No safe pocket for therapeutic paracentesis. IMPRESSION: Small volume ascites.  Paracentesis deferred. Electronically Signed   By: Lucrezia Europe M.D.   On: 01/03/2021 15:56      Assessment/Plan Principal Problem:   Hepatic encephalopathy (HCC) Active Problems:   Abdominal distension   Hypothyroidism   Ascites   COVID-19 virus infection     #1  Recurrent hepatic encephalopathy: Patient has end-stage liver disease.  She has Significant elevation of ammonia level today as high as 253.  It was 126 yesterday and 61 the day before.  At this point patient will be initiated on rectal lactulose retention enema.  It appears she is rapidly declining.  Care at this point appears to be futile with patient's husband is asking for  complete: Continue treatment.  Offered palliative care once again and he has declined.  #2  Ascites: Patient had ultrasound-guided paracentesis apparently yesterday.  Still has significant ascites.  #3 hypothyroidism: She is going to be n.p.o. due to altered mental status.  Once able to take p.o.'s may resume levothyroxine.  #4 COVID-19 infection:  Apparently discovered few days ago.  Does not appear to have any respiratory symptoms.  These may however have worsened her current hepatic vasculopathy.  Continue to monitor  #5 hyperkalemia: Potassium elevated.  No obvious cause.  Continue to monitor  #6 leukocytosis: White count has jumped to 18,000 from yesterday where it was 13,000.  Millimeters sign of infection.  Her CRP is elevated.  May consider empiric antibiotics.  No evidence of SBP   DVT prophylaxis: SCD Code Status: Full code Family Communication: Husband on the phone Disposition Plan: To be determined Consults called: None Admission status: Inpatient  Severity of Illness: The appropriate patient status for this patient is INPATIENT. Inpatient status is judged to be reasonable and necessary in order to provide the required intensity of service to ensure the patient's safety. The patient's presenting symptoms, physical exam findings, and initial radiographic and laboratory data in the context of their chronic comorbidities is felt to place them at high risk for further clinical deterioration. Furthermore, it is not anticipated that the patient will be medically stable for discharge from the hospital within 2 midnights of admission. The following factors support the patient status of inpatient.   " The patient's presenting symptoms include altered mental status. " The worrisome physical exam findings include completely obtunded with multiple bruises. " The initial radiographic and laboratory data are worrisome because of ammonia level of 257. " The chronic co-morbidities include end-stage liver disease.   * I certify that at the point of admission it is my clinical judgment that the patient will require inpatient hospital care spanning beyond 2 midnights from the point of admission due to high intensity of service, high risk for further deterioration and high frequency of surveillance required.Barbette Merino MD [facility] Pager [phone]  If 7PM-7AM, please contact night-coverage [email or web address] Password [email or web address]  01/04/2021, 9:02 PM

## 2021-01-04 NOTE — ED Provider Notes (Signed)
[facility] EMERGENCY DEPARTMENT Provider Note   CSN: [id number] Arrival date & time: 01/04/21  1623     History No chief complaint on file.   Megan Roberson is a 65 y.o. female who presented with cirrhosis secondary to [facility] and portal hypertension, severe medical encephalopathy who presented with confusion.  Patient was just admitted to the hospital and discharged 2 days ago for hepatic encephalopathy.  Palliative care was consulted and recommend for palliative but family wanted aggressive treatment.  Patient was subsequently discharged home.  Since discharge, patient has been progressively more confused.  Overnight she became agitated and became very altered.  Family was concerned that she has recurrent hepatic encephalopathy and called the hospice line.  He apparently wants her to be for hospice but since she is too agitated, EMS was called.  Patient was noted to be hypotensive per EMS.  Patient just had a paracentesis about a week ago.   The history is provided by the spouse.  Level V caveat- AMS, confusion    Past Medical History:  Diagnosis Date  . Anxiety and depression   . Cholelithiasis without cholecystitis   . Chronic abdominal pain   . Chronic nausea    + abd pain  . Cirrhosis of liver with ascites (HCC)    Alc cirrhosis; hx of multiple abd paracentesis, +esoph varices (+banding), pancytopenia, hx of hepatic enceph, hx of hydrothorax  . Esophageal candidiasis ([place]) 11/2020   Dr. Noreene Larsson (difluc contraind b/c prolonged QT)  . Heart murmur    Echo 10/06/17 [facility]): Normal LV/RV size, basal septal hypertophy-normal variant, LVEF 65-70%, normal LV/RV function, est RAP 5 mmHg, no sign valvular stenosis or regurg--trace MR/TR/PR  . History of abnormal cervical Pap smear   . History of pneumonia   . Hypothyroidism   . Insomnia   . Nephrolithiasis    right renal pelvis, 13-67m nonobst stone  . Neuropathy    unclear hx  . Osteoarthritis, multiple  sites    primarily hands and knees  . Osteoporosis 02/09/2018  . Ovarian cyst    bilat.  Stable on MRI pelvis 11/2019 and CT abd/pelv 09/2020--no f/u imaging indicated.  . Pancytopenia ([place]   . Prolonged QT interval   . Splenomegaly   . Thrombocytopenia ([place] 12/01/2019   severe, hx of plts down into 30s    Patient Active Problem List   Diagnosis Date Noted  . AMS (altered mental status) 12/29/2020  . Pain in left ankle and joints of left foot 12/04/2020  . Gastric ulcer   . Closed traumatic minimally displaced fracture of left calcaneus 09/23/2020  . Hydrothorax   . Ascites 09/03/2020  . Closed left ankle fracture 09/03/2020  . Mood disorder ([place] 08/17/2020  . Right lower lobe pneumonia 08/16/2020  . Asymptomatic bacteriuria   . Hypothyroidism 08/14/2020  . Displaced fracture of medial cuneiform of left foot, initial encounter for closed fracture 08/14/2020  . Failure to thrive in adult 08/14/2020  . Weight loss 08/14/2020  . UTI (urinary tract infection) 08/14/2020  . Fall at home, initial encounter 08/14/2020  . Generalized weakness 08/13/2020  . Pain in right knee 06/04/2020  . Pain in left hip 06/04/2020  . Nausea 05/19/2020  . Prolonged QT interval 05/19/2020  . Elevated brain natriuretic peptide (BNP) level 05/19/2020  . Hypoalbuminemia 05/19/2020  . Cough 05/19/2020  . Atelectasis 05/19/2020  . Hypomagnesemia 05/19/2020  . Cysts of both ovaries 06/15/2019  . Lumbar pain 02/27/2019  . Closed fracture of left  tibial plateau 05/17/18 07/08/2018  . 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 Osteoporosis 02/09/2018  . Goals of care, counseling/discussion 01/18/2018  . Refractory ascites 11/25/2017  . Cellulitis, abdominal wall 11/25/2017  . SOB (shortness of breath) 11/25/2017  . Protein-calorie malnutrition, severe ([place]) 11/06/2017  . Ogilvie's syndrome   . Abdominal distension   . Abdominal pain   . Liver failure without hepatic coma ([place])   .  Thrombocytopenia ([place])   . Palliative care by specialist   . Acute hepatic encephalopathy   . Closed fracture of lumbar spine without lesion of spinal cord ([place])   . Recurrent falls 06/04/2017  . 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 Cirrhosis of liver with ascites ([place]) 04/21/2017  . Pancytopenia ([place]) 04/21/2017  . History of open reduction and internal fixation (ORIF) procedure 04/21/2017  . Visit for suture removal 04/21/2017  . Left Olecranon fracture 04/21/2017  . Hypokalemia 04/21/2017    Past Surgical History:  Procedure Laterality Date  . AUGMENTATION MAMMAPLASTY Bilateral    20 years ago  . BIOPSY  03/15/2018   gastric.  Procedure: BIOPSY;  Surgeon: Yetta Flock, MD;  Location: [facility];  Service: Gastroenterology;;  Gastric  . BIOPSY  11/14/2020   Procedure: BIOPSY;  Surgeon: Yetta Flock, MD;  Location: [facility];  Service: Gastroenterology;;  . COLONOSCOPY  2019  . COLONOSCOPY WITH ESOPHAGOGASTRODUODENOSCOPY (EGD) AND ESOPHAGEAL DILATION (ED)    . COLONOSCOPY WITH PROPOFOL N/A 03/15/2018   Redundant colon, severe looping, very edematous colon c/w portal HTN.  Procedure: COLONOSCOPY WITH PROPOFOL;  Surgeon: Yetta Flock, MD;  Location: [facility];  Service: Gastroenterology;  Laterality: N/A;  . ESOPHAGOGASTRODUODENOSCOPY  11/14/2020   esoph candidiasis, nonbleeding ulcer (path benign), portal hypertensive gastropathy.  No varices.  . ESOPHAGOGASTRODUODENOSCOPY (EGD) WITH PROPOFOL N/A 03/15/2018   Gastritis, small gastric ulcer. Procedure: ESOPHAGOGASTRODUODENOSCOPY (EGD) WITH PROPOFOL;  Surgeon: Yetta Flock, MD;  Location: [facility];  Service: Gastroenterology;  Laterality: N/A;  . ESOPHAGOGASTRODUODENOSCOPY (EGD) WITH PROPOFOL N/A 11/14/2020   Procedure: ESOPHAGOGASTRODUODENOSCOPY (EGD) WITH PROPOFOL;  Surgeon: Yetta Flock, MD;  Location: [facility];  Service: Gastroenterology;  Laterality: N/A;  .  ESOPHAGOGASTRODUODENOSCOPY W/ BANDING     Varices  . FRACTURE SURGERY Left    x 2 L elbow and hip left   . HARDWARE REMOVAL Left 09/17/2017   Procedure: HARDWARE REMOVAL LEFT OLECRANON;  Surgeon: Altamese Eldred, MD;  Location: [facility];  Service: Orthopedics;  Laterality: Left;  . IR PARACENTESIS  11/18/2017  . IR PARACENTESIS  11/26/2017  . IR PARACENTESIS  03/14/2018  . IR PARACENTESIS  08/08/2020  . IR PARACENTESIS  09/23/2020  . IR PARACENTESIS  10/22/2020  . IR PARACENTESIS  10/30/2020  . IR PARACENTESIS  11/12/2020  . IR PARACENTESIS  11/20/2020  . IR PARACENTESIS  11/27/2020  . IR PARACENTESIS  12/05/2020  . IR PARACENTESIS  12/12/2020  . IR PARACENTESIS  12/18/2020  . IR PARACENTESIS  12/31/2020  . KNEE SURGERY Right    open incision for ligaments  . ORIF ELBOW FRACTURE Left 04/30/2017   Procedure: REVISION OPEN REDUCTION INTERNAL FIXATION (ORIF) ELBOW/OLECRANON FRACTURE;  Surgeon: Altamese [place], MD;  Location: [facility];  Service: Orthopedics;  Laterality: Left;  . ORIF HIP FRACTURE Left   . REPAIR EXTENSOR TENDON Left 12/29/2019   Procedure: LEFT LONG AND LEFT RING FRINGER EXTENSOR REALIGNAMENT WITH POSSIBLE TENDON TRANSFER;  Surgeon: Charlotte Crumb, MD;  Location: [facility];  Service: Orthopedics;  Laterality: Left;  .  TRANSTHORACIC ECHOCARDIOGRAM  05/19/2020   extremely poor acoustic window/technically poor study-->normal  . virtual colonoscopy  04/24/2019   no polyps, no mass, no apple core lesion, no stricture     OB History    Gravida  2   Para  2   Term  2   Preterm      AB      Living  1     SAB      IAB      Ectopic      Multiple      Live Births  2           Family History  Adopted: Yes  Problem Relation Age of Onset  . Alcohol abuse Daughter     Social History   Tobacco Use  . Smoking status: Never Smoker  . Smokeless tobacco: Never Used  Vaping Use  . Vaping Use: Never used  Substance Use Topics  . Alcohol use: No  . Drug use: No    Home  Medications Prior to Admission medications   Medication Sig Start Date End Date Taking? Authorizing Provider  diclofenac Sodium (VOLTAREN) 1 % GEL Apply 2 g topically daily as needed (for pain). 04/23/20   [provider]  feeding supplement (ENSURE ENLIVE / ENSURE PLUS) LIQD Take 237 mLs by mouth 2 (two) times daily between meals. 09/08/20   Hosie Poisson, MD  ferrous sulfate 325 (65 FE) MG EC tablet Take 1 tablet (325 mg total) by mouth in the morning and at bedtime. 09/17/20   Armbruster, Carlota Raspberry, MD  lactulose (CHRONULAC) 10 GM/15ML solution Take 30 mLs (20 g total) by mouth 3 (three) times daily. Patient taking differently: Take 20 g by mouth 3 (three) times daily as needed for mild constipation. 09/08/20   Hosie Poisson, MD  levothyroxine (SYNTHROID, LEVOTHROID) 75 MCG tablet Take 1 tablet (75 mcg total) by mouth daily before breakfast. 12/02/17   Aline August, MD  Multiple Vitamin (MULTIVITAMIN WITH MINERALS) TABS tablet Take 1 tablet by mouth daily. 09/09/20   Hosie Poisson, MD  omeprazole (PRILOSEC) 40 MG capsule Take 1 capsule (40 mg total) by mouth 2 (two) times daily for 240 doses. 11/14/20 03/14/21  Armbruster, Carlota Raspberry, MD  ondansetron (ZOFRAN ODT) 4 MG disintegrating tablet Take 1 tablet (4 mg total) by mouth every 8 (eight) hours as needed for nausea or vomiting. 11/20/20   McGowen, Adrian Blackwater, MD  Oxycodone HCl 10 MG TABS 1 tab po tid prn pain Patient taking differently: Take 10 mg by mouth 3 (three) times daily as needed. 1 tab po tid prn pain 12/09/20   McGowen, Adrian Blackwater, MD  potassium chloride SA (KLOR-CON) 20 MEQ tablet Take 2 tablets (40 mEq total) by mouth daily. 09/08/20   Hosie Poisson, MD  rifaximin (XIFAXAN) 550 MG TABS tablet Take 1 tablet (550 mg total) by mouth 2 (two) times daily. 01/02/21   Thurnell Lose, MD  sertraline (ZOLOFT) 100 MG tablet Take 100 mg by mouth daily. 08/17/20   [provider]  sertraline (ZOLOFT) 50 MG tablet Take 1 tablet (50 mg total)  by mouth daily. 08/21/20   Nita Sells, MD  spironolactone (ALDACTONE) 100 MG tablet Take 2 tablets (200 mg total) by mouth daily. 09/09/20   Hosie Poisson, MD  Suvorexant (BELSOMRA) 10 MG TABS 1 tab po about 30-60 min prior to bedtime nightly 12/09/20   McGowen, Adrian Blackwater, MD    Allergies    Penicillins, Sulfa antibiotics, Tramadol,  and Tylenol [acetaminophen]  Review of Systems   Review of Systems  Unable to perform ROS: Mental status change  All other systems reviewed and are negative.   Physical Exam Updated Vital Signs BP 127/74   Pulse (!) 101 Comment: Simultaneous filing. User may not have seen previous data.  Resp (!) 27 Comment: Simultaneous filing. User may not have seen previous data.  SpO2 100% Comment: Simultaneous filing. User may not have seen previous data.  Physical Exam Vitals and nursing note reviewed.  Constitutional:      Comments: Confused and agitated  HENT:     Head: Normocephalic.     Nose: Nose normal.     Mouth/Throat:     Mouth: Mucous membranes are dry.  Eyes:     Extraocular Movements: Extraocular movements intact.     Pupils: Pupils are equal, round, and reactive to light.  Cardiovascular:     Rate and Rhythm: Normal rate and regular rhythm.     Pulses: Normal pulses.     Heart sounds: Normal heart sounds.  Pulmonary:     Comments: Diminished bilateral bases Abdominal:     Comments: Distended with fluid wave, no obvious abdominal tenderness.  Liver appears enlarged  Musculoskeletal:        General: Normal range of motion.     Cervical back: Normal range of motion and neck supple.  Skin:    General: Skin is warm.     Capillary Refill: Capillary refill takes less than 2 seconds.     Comments: Bruising on the right forearm and skin tear in the left forearm that was noted during the admission  Neurological:     Comments: Agitated, moving all extremities.  Unable to follow command  Psychiatric:     Comments: Unable     ED Results /  Procedures / Treatments   Labs (all labs ordered are listed, but only abnormal results are displayed) Labs Reviewed  RESP PANEL BY RT-PCR (FLU A&B, COVID) ARPGX2  CBC WITH DIFFERENTIAL/PLATELET  COMPREHENSIVE METABOLIC PANEL  AMMONIA  PROTIME-INR    EKG None  Radiology IR ABDOMEN US LIMITED  Result Date: 01/03/2021 CLINICAL DATA:  History of recurrent symptomatic abdominal ascites. EXAM: LIMITED ABDOMEN ULTRASOUND FOR ASCITES TECHNIQUE: Limited ultrasound survey for ascites was performed in all four abdominal quadrants. COMPARISON:  12/31/2020 FINDINGS: Survey ultrasound showed small volume abdominal ascites. No safe pocket for therapeutic paracentesis. IMPRESSION: Small volume ascites.  Paracentesis deferred. Electronically Signed   By: Lucrezia Europe M.D.   On: 01/03/2021 15:56    Procedures Procedures CRITICAL CARE Performed by: Wandra Arthurs   Total critical care time: 30 minutes  Critical care time was exclusive of separately billable procedures and treating other patients.  Critical care was necessary to treat or prevent imminent or life-threatening deterioration.  Critical care was time spent personally by me on the following activities: development of treatment plan with patient and/or surrogate as well as nursing, discussions with consultants, evaluation of patient's response to treatment, examination of patient, obtaining history from patient or surrogate, ordering and performing treatments and interventions, ordering and review of laboratory studies, ordering and review of radiographic studies, pulse oximetry and re-evaluation of patient's condition.   Angiocath insertion Performed by: Wandra Arthurs  Consent: Verbal consent obtained. Risks and benefits: risks, benefits and alternatives were discussed Time out: Immediately prior to procedure a "time out" was called to verify the correct patient, procedure, equipment, support staff and site/side marked as  required.  Preparation:  Patient was prepped and draped in the usual sterile fashion.  Vein Location: R brachial  Ultrasound Guided  Gauge: 20 long   Normal blood return and flush without difficulty Patient tolerance: Patient tolerated the procedure well with no immediate complications.     Medications Ordered in ED Medications  sodium chloride 0.9 % bolus 1,000 mL (1,000 mLs Intravenous New Bag/Given 01/04/21 1658)  LORazepam (ATIVAN) injection 1 mg (1 mg Intravenous Given 01/04/21 1655)    ED Course  I have reviewed the triage vital signs and the nursing notes.  Pertinent labs & imaging results that were available during my care of the patient were reviewed by me and considered in my medical decision making (see chart for details).    MDM Rules/Calculators/A&P                         Naylea Wigington is a 65 y.o. female here presenting with altered mental status and hypotension.  I am concerned for recurrent hepatic encephalopathy.  I have a long discussion with husband.  He is agreement that this is a end-stage process and would like to initiate the hospice process.  However, he was very concerned for her agitation and confusion.  At this point, patient required two-point restraints and will order Ativan for sedation.  We will also order ammonia level and CMP and CBC.  Patient will need to be admitted and will need inpatient hospice    6:34 PM Patient's ammonia level is 253.  Patient LFTs are stable and her bilirubin is 7.  Her anion gap is 19.  Blood pressure improved to the low 100s after IV fluids.  Patient has poor mental status unable to take p.o. lactulose.  I ordered lactulose enema.  Patient will be readmitted to the medicine service.  At this point, family is agreeable for more hospice approach.  We will need to reconsult palliative care while she is in the hospital.  Final Clinical Impression(s) / ED Diagnoses Final diagnoses:  None    Rx / DC Orders ED Discharge  Orders    None       Drenda Freeze, MD 01/04/21 1836

## 2021-01-04 NOTE — ED Notes (Signed)
Pt put in soft restraints per MD.

## 2021-01-04 NOTE — ED Triage Notes (Addendum)
Family member states pt was discharged from hospital yesterday and continues to be disoriented.  States he is unable to get pt to take medications and he spoke with Hospice and they told him to bring pt back to hospital.  States she is not a hospice pt at this time.  Hypotensive. Pt taken to treatment room.

## 2021-01-05 DIAGNOSIS — K7290 Hepatic failure, unspecified without coma: Secondary | ICD-10-CM | POA: Diagnosis not present

## 2021-01-05 DIAGNOSIS — E039 Hypothyroidism, unspecified: Secondary | ICD-10-CM | POA: Diagnosis not present

## 2021-01-05 DIAGNOSIS — K7460 Unspecified cirrhosis of liver: Secondary | ICD-10-CM

## 2021-01-05 DIAGNOSIS — K7581 Nonalcoholic steatohepatitis (NASH): Secondary | ICD-10-CM

## 2021-01-05 DIAGNOSIS — Z515 Encounter for palliative care: Secondary | ICD-10-CM

## 2021-01-05 DIAGNOSIS — U071 COVID-19: Secondary | ICD-10-CM | POA: Diagnosis not present

## 2021-01-05 LAB — CBC
HCT: 28.3 % — ABNORMAL LOW (ref 36.0–46.0)
Hemoglobin: 8.7 g/dL — ABNORMAL LOW (ref 12.0–15.0)
MCH: 28 pg (ref 26.0–34.0)
MCHC: 30.7 g/dL (ref 30.0–36.0)
MCV: 91 fL (ref 80.0–100.0)
Platelets: 71 10*3/uL — ABNORMAL LOW (ref 150–400)
RBC: 3.11 MIL/uL — ABNORMAL LOW (ref 3.87–5.11)
RDW: 22.6 % — ABNORMAL HIGH (ref 11.5–15.5)
WBC: 11.6 10*3/uL — ABNORMAL HIGH (ref 4.0–10.5)
nRBC: 0 % (ref 0.0–0.2)

## 2021-01-05 LAB — COMPREHENSIVE METABOLIC PANEL
ALT: 29 U/L (ref 0–44)
AST: 124 U/L — ABNORMAL HIGH (ref 15–41)
Albumin: 2.9 g/dL — ABNORMAL LOW (ref 3.5–5.0)
Alkaline Phosphatase: 94 U/L (ref 38–126)
Anion gap: 15 (ref 5–15)
BUN: 20 mg/dL (ref 8–23)
CO2: 19 mmol/L — ABNORMAL LOW (ref 22–32)
Calcium: 8.6 mg/dL — ABNORMAL LOW (ref 8.9–10.3)
Chloride: 100 mmol/L (ref 98–111)
Creatinine, Ser: 1.16 mg/dL — ABNORMAL HIGH (ref 0.44–1.00)
GFR, Estimated: 53 mL/min — ABNORMAL LOW (ref >60)
Glucose, Bld: 77 mg/dL (ref 70–99)
Potassium: 3.7 mmol/L (ref 3.5–5.1)
Sodium: 134 mmol/L — ABNORMAL LOW (ref 135–145)
Total Bilirubin: 5.7 mg/dL — ABNORMAL HIGH (ref 0.3–1.2)
Total Protein: 5.4 g/dL — ABNORMAL LOW (ref 6.5–8.1)

## 2021-01-05 MED ORDER — GLYCOPYRROLATE 1 MG PO TABS: 1.0000 mg | ORAL_TABLET | ORAL | Status: DC | PRN

## 2021-01-05 MED ORDER — BIOTENE DRY MOUTH MT LIQD: 15.0000 mL | OROMUCOSAL | Status: DC | PRN

## 2021-01-05 MED ORDER — LEVOTHYROXINE SODIUM 100 MCG/5ML IV SOLN
35.0000 ug | Freq: Every day | INTRAVENOUS | Status: DC
  Administered 2021-01-05: 35 ug via INTRAVENOUS
  Filled 2021-01-05: qty 5

## 2021-01-05 MED ORDER — GLYCOPYRROLATE 0.2 MG/ML IJ SOLN: 0.2000 mg | INTRAMUSCULAR | Status: DC | PRN

## 2021-01-05 MED ORDER — POLYVINYL ALCOHOL 1.4 % OP SOLN: 1.0000 [drp] | Freq: Four times a day (QID) | OPHTHALMIC | Status: DC | PRN

## 2021-01-05 MED ORDER — FENTANYL CITRATE (PF) 100 MCG/2ML IJ SOLN
25.0000 ug | INTRAMUSCULAR | Status: DC
  Administered 2021-01-05 – 2021-01-08 (×17): 25 ug via INTRAVENOUS
  Filled 2021-01-05 (×16): qty 2

## 2021-01-05 MED ORDER — LACTULOSE 10 GM/15ML PO SOLN
30.0000 g | Freq: Four times a day (QID) | ORAL | Status: DC
  Administered 2021-01-05: 30 g
  Filled 2021-01-05 (×2): qty 45

## 2021-01-05 MED ORDER — ONDANSETRON HCL 4 MG/2ML IJ SOLN: 4.0000 mg | Freq: Four times a day (QID) | INTRAMUSCULAR | Status: DC | PRN

## 2021-01-05 MED ORDER — LORAZEPAM 2 MG/ML IJ SOLN
0.5000 mg | INTRAMUSCULAR | Status: DC | PRN
  Administered 2021-01-05: 0.5 mg via INTRAVENOUS

## 2021-01-05 MED ORDER — FENTANYL CITRATE (PF) 100 MCG/2ML IJ SOLN: 25.0000 ug | INTRAMUSCULAR | Status: DC | PRN

## 2021-01-05 MED ORDER — HALOPERIDOL LACTATE 5 MG/ML IJ SOLN: 0.5000 mg | INTRAMUSCULAR | Status: DC | PRN

## 2021-01-05 MED ORDER — HALOPERIDOL 0.5 MG PO TABS: 0.5000 mg | ORAL_TABLET | ORAL | Status: DC | PRN

## 2021-01-05 MED ORDER — HALOPERIDOL LACTATE 2 MG/ML PO CONC: 0.5000 mg | ORAL | Status: DC | PRN

## 2021-01-05 MED ORDER — MORPHINE SULFATE (PF) 2 MG/ML IV SOLN
1.0000 mg | INTRAVENOUS | Status: DC | PRN
  Administered 2021-01-05: 1 mg via INTRAVENOUS
  Filled 2021-01-05: qty 1

## 2021-01-05 MED ORDER — ONDANSETRON 4 MG PO TBDP: 4.0000 mg | ORAL_TABLET | Freq: Four times a day (QID) | ORAL | Status: DC | PRN

## 2021-01-05 MED ORDER — LORAZEPAM 2 MG/ML IJ SOLN
0.2500 mg | Freq: Four times a day (QID) | INTRAMUSCULAR | Status: DC | PRN
  Administered 2021-01-05: 0.25 mg via INTRAVENOUS
  Filled 2021-01-05: qty 1

## 2021-01-05 MED ORDER — LORAZEPAM 2 MG/ML IJ SOLN
0.5000 mg | Freq: Two times a day (BID) | INTRAMUSCULAR | Status: DC
  Administered 2021-01-06 – 2021-01-08 (×5): 0.5 mg via INTRAVENOUS
  Filled 2021-01-05 (×6): qty 1

## 2021-01-05 NOTE — Consult Note (Addendum)
Consultation Note Date: 01/05/2021   Patient Name: Megan Roberson  DOB: 1955/12/17  MRN: [id number]  Age / Sex: 65 y.o., female  PCP: Megan Sou, MD Referring Physician: Jonetta Osgood, MD  Reason for Consultation: Establishing goals of care    ADDENDUM 3:50 pm - met with Megan Roberson at bedside and [name] on face time.  Megan Roberson is an adopted daughter.  She is also associated with Hospice.  We talked and decided to pursue comfort care.  Family requests that restraints be removed and not restarted.  Also remove n/g and monitoring.  Family would prefer to avoid a drip in order to allow her daughter Megan Roberson to speak with her.  Megan Roberson and Megan Roberson are in agreement with comfort measures.    HPI/Patient Profile: 65 y.o. female  with past medical history of NASH cirrhosis, with recurrent hepatic encephalopathy, ACS, hypothyroidism, chronic abdominal pain, severe protein calorie malnutrition who was admitted on 01/04/2021 with altered mental status.  She was obtunded and did not respond to pain.  Her ammonia level last evening was 253.  Wbc 18.5Albumin 2.9, Tbili 5.7, PT 18.8, INR 1.6.  Her last U/A on 2/28 was indicative of UTI with many bacteria and leukocytes.  She was just discharged on 3/3 after an admission for similar symptoms.  She has had 4 admissions in the last six months.  Clinical Assessment and Goals of Care:  I have reviewed medical records including EPIC notes, labs and imaging, received report from Dr. Sloan Roberson, examined the patient and spoke on the phone with her husband, Megan Roberson,  to discuss diagnosis prognosis, Megan Roberson, EOL wishes, disposition and options.  I introduced Palliative Medicine as specialized medical care for people living with serious illness. It focuses on providing relief from the symptoms and stress of a serious illness.   We discussed a brief life review of the patient. Megan Roberson  describes Megan Roberson as an incredibly kind giving woman, very soft spoken and non-confrontational "a heart of gold".  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  She and Megan Roberson built their dream home on a private [place] in [place].  She was very hard working and successful.  Unfortunately about 10-12 years ago she developed NASH as as the disease took her ability to think clearly she lost her job and eventually their dream home.  Megan Roberson had 2 daughters.  Her youngest Megan Roberson died of Megan Roberson cirrhosis at a hospice facility 7 years ago.  Megan Roberson was never the same afterward.  She lost her mother 5 years ago.  Her father is still living and her eldest daughter is in [place], but has trouble with Megan Roberson.  Megan Roberson also has a sister locally Megan Roberson).  In addition to being incredibly giving, Megan Roberson describes Megan Roberson as stubborn.  Once she makes up her mind to do something there's little anyone can do to stop her.  Finally he mentions her 3 chihuahua's are her absolute joy.  Megan Roberson expressed concern that Megan Roberson had not been eating at home lately - he's concerned she'll starve.  He also states that Megan Roberson just hates the taste and consistency of lactulose she simply can't stand to take it.     We discussed her current illness and what it means in the larger context of her on-going co-morbidities.  Megan Roberson understands that Megan Roberson's illness is critical and terminal.  He expressed being scared and feeling that he is being selfish with her because he understands that she can not get better from this.    Megan Roberson is incredibly devoted to Megan Roberson (I believe this is both of there second marriage as they both have children from a previous marriage).  He has walked every step of this illness with her taking her to every doctors appointment and having lost their home as well as his job due to her illness.  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  The hours and location will be much better suited to allowing him to care for Megan Roberson.  I explained to Megan Roberson that eventually Megan Roberson's NASH cirrhosis will progress to the point where even our medications (lactulose) will not be able to prevent or correct her hepatic encephalopathy.    After Megan Roberson and I developed a good rapport and I had a better understanding of who Megan Roberson is - I explained that Palliative Care in the hospital is part of the medical team.  We function as an extra layer of support for patient's and families with very serious illness.   I offered my assistance to Megan Roberson in supporting Megan Roberson's symptoms and addressing any concerns or questions he may have.   Megan Roberson was appreciative of the call and stated that he hoped we could speak in person soon.   Questions and concerns were addressed.  The family was encouraged to call with questions or concerns.    Primary Decision Maker:  NEXT OF KIN Husband Megan Roberson.   Patient has living father and daughter that I believe are in [place] and a local sister.    SUMMARY OF RECOMMENDATIONS    PMT will follow with you to continue to support patient and family. Megan Roberson understands the situation, but needs significant support to process and get thru this.  He has no support on the [facility] He supports an N/G for lactulose and feeding when appropriate. Checked a U/A Will request chaplain support for patient and husband.  Code Status/Advance Care Planning:  Currently full code.  PMT will discuss with Megan Roberson in the coming days.   Symptom Management:   Per primary  Additional Recommendations (Limitations, Scope, Preferences):  Full Scope Treatment  Palliative Prophylaxis:   Delirium Protocol  Psycho-social/Spiritual:   Desire for further Chaplaincy support: yes  Prognosis:  Given quickly recurrent HE, I'm very concerned her prognosis is short.  She is Hospice Facility  eligible should that fit with patient/family wishes.    Discharge Planning: To Be Determined      Primary Diagnoses: Present on Admission: . Hepatic encephalopathy ([place]) . Abdominal distension . Ascites . Hypothyroidism . COVID-19 virus infection   I have reviewed the medical record, interviewed the patient and family, and examined the patient. The following aspects are pertinent.  Past Medical History:  Diagnosis Date  . Anxiety and depression   . Cholelithiasis without cholecystitis   . Chronic abdominal pain   . Chronic nausea    + abd pain  . Cirrhosis of liver with ascites ([place])  Alc cirrhosis; hx of multiple abd paracentesis, +esoph varices (+banding), pancytopenia, hx of hepatic enceph, hx of hydrothorax  . Esophageal candidiasis ([place]) 11/2020   Dr. Noreene Larsson (difluc contraind b/c prolonged QT)  . Heart murmur    Echo 10/06/17 [facility]): Normal LV/RV size, basal septal hypertophy-normal variant, LVEF 65-70%, normal LV/RV function, est RAP 5 mmHg, no sign valvular stenosis or regurg--trace MR/TR/PR  . History of abnormal cervical Pap smear   . History of pneumonia   . Hypothyroidism   . Insomnia   . Nephrolithiasis    right renal pelvis, 13-21m nonobst stone  . Neuropathy    unclear hx  . Osteoarthritis, multiple sites    primarily hands and knees  . Osteoporosis 02/09/2018  . Ovarian cyst    bilat.  Stable on MRI pelvis 11/2019 and CT abd/pelv 09/2020--no f/u imaging indicated.  . [place]topenia [place]   . Prolonged QT interval   . Splenomegaly   . [place]ocyt[place] 12[place]   severe, hx of plts down into 30s   Social History   Socioeconomic History  . Marital status: Married    Spouse name: Not on file  . Number of children: 2  . Years of education: Not on file  . Highest education level: Not on file  Occupational History  . Occupation: disabled  Tobacco Use  . Smoking status: Never Smoker  . Smokeless tobacco: Never Used  Vaping  Use  . Vaping Use: Never used  Substance and Sexual Activity  . Alcohol use: No  . Drug use: No  . Sexual activity: Not Currently    Birth control/protection: Post-menopausal  Other Topics Concern  . Not on file  Social History Narrative  . Not on file   Social Determinants of Health   Financial Resource Strain: Not on file  Food Insecurity: Not on file  Transportation Needs: No Transportation Needs  . Lack of Transportation (Medical): No  . Lack of Transportation (Non-Medical): No  Physical Activity: Not on file  Stress: Not on file  Social Connections: Not on file   Family History  Adopted: Yes  Problem Relation Age of Onset  . Alcohol abuse Daughter     Allergies  Allergen Reactions  . Penicillins Swelling and Other (See Comments)    FACIAL SWELLING Did it involve swelling of the face/tongue/throat, SOB, or low BP? Yes Did it involve sudden or severe rash/hives, skin peeling, or any reaction on the inside of your mouth or nose? No Did you need to seek medical attention at a hospital or doctor's office? Unknown When did it last happen?30 years If all above answers are "NO", may proceed with cephalosporin use.   . Sulfa Antibiotics Itching and Rash  . Tramadol Swelling and Rash  . Tylenol [Acetaminophen] Nausea Only     Vital Signs: BP 110/62   Pulse 78   Temp (!) 97 F (36.1 C) (Axillary)   Resp 14   Wt 43.8 kg   SpO2 100%   BMI 15.12 kg/m  Pain Scale: PAINAD       SpO2: SpO2: 100 % O2 Device:SpO2: 100 % O2 Flow Rate: .O2 Flow Rate (L/min): 1 L/min    Palliative Assessment/Data: 10%     Time In: 9:00 Time Out: 10:15 Time In:  2:00 pm Time out:  3:15 pm Time Total: 150 min. Visit consisted of counseling and education dealing with the complex and emotionally intense issues surrounding the need for palliative care and symptom management in the setting of serious and  potentially life-threatening illness. Greater than 50%  of this time was  spent counseling and coordinating care related to the above assessment and plan.  Signed by: Florentina Jenny, PA-C Palliative Medicine  Please contact Palliative Medicine Team phone at [phone] for questions and concerns.  For individual provider: See Shea Evans

## 2021-01-05 NOTE — Progress Notes (Signed)
PROGRESS NOTE        PATIENT DETAILS Name: Megan Roberson Age: 65 y.o. Sex: female Date of Birth: Jun 26, 1956 Admit Date: 01/04/2021 Admitting Physician Elwyn Reach, MD FTD:DUKGURK, Adrian Blackwater, MD  Brief Narrative: Patient is a 66 y.o. female ESRD, hypothyroidism-just discharged on 3/4-after being treated for hepatic encephalopathy-presented to the hospital on 3/5 with severe confusion/encephalopathy-thought to be due to recurrent hepatic encephalopathy.    Significant events: 2/27-3/4>> hospitalized for acute hepatic encephalopathy, incidental COVID-19 infection 3/5>> admit for severe hepatic encephalopathy  Significant studies: 3/5>> chest x-ray: Chronic loculated right sided pleural effusion  Antimicrobial therapy: None  Microbiology data: None  Procedures : None  Consults: Palliative care  DVT Prophylaxis : SCDs Start: 01/04/21 2151    Subjective: Very confused-restless-NG tube in place-barely opens eyes-not following commands.   Assessment/Plan: Hepatic encephalopathy: Very confused-not following commands-no response to lactulose enema given on admission-starting lactulose via NG tube.  Continue rifaximin.  End-stage liver cirrhosis: Per prior GI note-not a candidate for TIPS procedure-not a candidate for liver transplantation-overall prognosis is very poor-during the most recent hospitalization-patient was resistant to hospice care-have reconsulted palliative care.  Chronic right-sided pleural effusion: Asymptomatic-chronic issue-supportive care-outpatient follow-up with PCP.  Recurrent ascites: Hold diuretics until she is clinically better/encephalopathy improved-Per IR note on 3/4-only minimal ascites seen-and not amenable for paracentesis.  Thrombocytopenia: Secondary to liver cirrhosis/hypersplenism-follow closely.  Hypothyroidism: Switch to IV levothyroxine given severe encephalopathy and inability to take oral  intake.  Palliative care: Has end-stage liver cirrhosis-not a candidate for liver transplant/TIPS procedure (per prior GI note)-noncompliant-her overall prognosis is very very poor-we will likely continue to deteriorate from here on out.  Long conversation with patient's husband over the phone-he understands poor prognosis-risk of recurrent hospitalizations for encephalopathy and other complications from cirrhosis-I advised him to consider initiation of hospice care.  After much discussion-he was agreeable for DNR order, I have reached out to palliative care-they will contact the husband in a few hours to see if he is agreeable to initiate comfort/hospice care during this hospital stay.  COVID-19 infection: Incidental-10 days of isolation from day of first positive test.  No therapy is required.   Lab Results  Component Value Date   SARSCOV2NAA POSITIVE (A) 01/04/2021   SARSCOV2NAA POSITIVE (A) 12/29/2020   SARSCOV2NAA NEGATIVE 11/11/2020   [place] NEGATIVE 09/03/2020    Diet: Diet Order            Diet NPO time specified  Diet effective now                  Code Status: DNR  Family Communication: Spouse-Anthony-[phone]-over the phone on 3/6.  Disposition Plan: Status is: Inpatient  Remains inpatient appropriate because:Inpatient level of care appropriate due to severity of illness   Dispo: The patient is from: Home              Anticipated d/c is to: Home              Patient currently is not medically stable to d/c.   Difficult to place patient No   Barriers to Discharge: Severe hepatic encephalopathy  Antimicrobial agents: Anti-infectives (From admission, onward)   Start     Dose/Rate Route Frequency Ordered Stop   01/04/21 2245  rifaximin (XIFAXAN) tablet 550 mg        550 mg Oral 2 times daily  01/04/21 2150         Time spent: 25- minutes-Greater than 50% of this time was spent in counseling, explanation of diagnosis, planning of further  management, and coordination of care.  MEDICATIONS: Scheduled Meds: . folic acid  1 mg Intravenous Daily  . lactulose  30 g Per Tube Q6H  . levothyroxine  35 mcg Intravenous Daily  . rifaximin  550 mg Oral BID  . thiamine injection  100 mg Intravenous Daily   Continuous Infusions: PRN Meds:.   PHYSICAL EXAM: Vital signs: Vitals:   01/04/21 2049 01/04/21 2120 01/04/21 2319 01/05/21 0519  BP: 101/69 109/66 110/71 110/62  Pulse: 78     Resp: 15 19 17 14   Temp: 97.7 F (36.5 C) (!) 97.4 F (36.3 C) (!) 97.3 F (36.3 C) (!) 97 F (36.1 C)  TempSrc: Axillary Oral Axillary Axillary  SpO2: 100% 100% 100% 100%  Weight:  43.8 kg     Filed Weights   01/04/21 2120  Weight: 43.8 kg   Body mass index is 15.12 kg/m.   Gen Exam: Very confused-literally obtunded.  Not following commands. HEENT:atraumatic, normocephalic Chest: B/L clear to auscultation anteriorly CVS:S1S2 regular Abdomen:soft non tender, non distended Extremities:no edema Neurology: Difficult exam-but moving all 4 extremities.   Skin: no rash  I have personally reviewed following labs and imaging studies  LABORATORY DATA: CBC: Recent Labs  Lab 12/31/20 0123 01/01/21 0219 01/02/21 0108 01/03/21 0111 01/04/21 1705 01/05/21 0226  WBC 5.4 6.5 10.9* 13.4* 18.5* 11.6*  NEUTROABS 3.9 5.0 8.9* 10.7* 15.4*  --   HGB 7.2* 8.5* 9.2* 8.6* 10.3* 8.7*  HCT 23.7* 28.0* 31.2* 28.0* 34.8* 28.3*  MCV 89.1 90.0 91.5 90.3 93.0 91.0  PLT 40* 50* 69* 92* 167 71*    Basic Metabolic Panel: Recent Labs  Lab 12/30/20 0719 12/31/20 0123 01/01/21 0219 01/02/21 0108 01/03/21 0111 01/04/21 1705 01/05/21 0226  NA  --  136 134* 133* 131* 135 134*  K  --  4.2 4.1 4.3 4.7 5.4* 3.7  CL  --  106 100 100 100 98 100  CO2  --  21* 25 23 18* 18* 19*  GLUCOSE  --  89 130* 107* 81 85 77  BUN  --  11 8 12 19 19 20   CREATININE  --  0.76 0.80 0.88 0.96 1.27* 1.16*  CALCIUM  --  8.9 8.9 8.9 8.8* 9.6 8.6*  MG 2.0 2.0 2.1 2.2 2.3   --   --     GFR: Estimated Creatinine Clearance: 34.8 mL/min (A) (by C-G formula based on SCr of 1.16 mg/dL (H)).  Liver Function Tests: Recent Labs  Lab 01/01/21 0219 01/02/21 0108 01/03/21 0111 01/04/21 1705 01/05/21 0226  AST 38 37 43* 101* 124*  ALT 16 20 19 29 29   ALKPHOS 94 102 92 120 94  BILITOT 4.4* 5.9* 5.3* 7.0* 5.7*  PROT 5.6* 6.1* 5.8* 6.7 5.4*  ALBUMIN 3.1* 3.2* 3.0* 3.6 2.9*   No results for input(s): LIPASE, AMYLASE in the last 168 hours. Recent Labs  Lab 12/31/20 0123 01/01/21 0219 01/02/21 0108 01/03/21 0111 01/04/21 1705  AMMONIA 60* 71* 61* 126* 253*    Coagulation Profile: Recent Labs  Lab 12/29/20 1654 12/30/20 0400 01/04/21 1705  INR 1.6* 1.8* 1.6*    Cardiac Enzymes: No results for input(s): CKTOTAL, CKMB, CKMBINDEX, TROPONINI in the last 168 hours.  BNP (last 3 results) No results for input(s): PROBNP in the last 8760 hours.  Lipid Profile: No results  for input(s): CHOL, HDL, LDLCALC, TRIG, CHOLHDL, LDLDIRECT in the last 72 hours.  Thyroid Function Tests: No results for input(s): TSH, T4TOTAL, FREET4, T3FREE, THYROIDAB in the last 72 hours.  Anemia Panel: No results for input(s): VITAMINB12, FOLATE, FERRITIN, TIBC, IRON, RETICCTPCT in the last 72 hours.  Urine analysis:    Component Value Date/Time   COLORURINE AMBER (A) 12/30/2020 1129   APPEARANCEUR HAZY (A) 12/30/2020 1129   LABSPEC 1.031 (H) 12/30/2020 1129   PHURINE 7.0 12/30/2020 1129   GLUCOSEU NEGATIVE 12/30/2020 1129   HGBUR LARGE (A) 12/30/2020 1129   BILIRUBINUR NEGATIVE 12/30/2020 1129   BILIRUBINUR postive 12/09/2020 1519   KETONESUR NEGATIVE 12/30/2020 1129   PROTEINUR 30 (A) 12/30/2020 1129   UROBILINOGEN 2.0 (A) 12/09/2020 1519   NITRITE NEGATIVE 12/30/2020 1129   LEUKOCYTESUR MODERATE (A) 12/30/2020 1129    Sepsis Labs: Lactic Acid, Venous    Component Value Date/Time   LATICACIDVEN 2.3 (HH) 11/02/2020 1639    MICROBIOLOGY: Recent Results (from  the past 240 hour(s))  Gram stain     Status: None   Collection Time: 12/29/20  5:18 PM   Specimen: Peritoneal Washings; Peritoneal Fluid  Result Value Ref Range Status   Specimen Description PERITONEAL  Final   Special Requests NONE  Final   Gram Stain   Final    WBC PRESENT,BOTH PMN AND MONONUCLEAR NO ORGANISMS SEEN CYTOSPIN SMEAR Performed at [facility], [address], [place], [place] [address]    Report Status 12/29/2020 FINAL  Final  Culture, body fluid w Gram Stain-bottle     Status: None   Collection Time: 12/29/20  5:38 PM   Specimen: Peritoneal Washings  Result Value Ref Range Status   Specimen Description PERITONEAL  Final   Special Requests PERITONEAL FLD  Final   Culture   Final    NO GROWTH 5 DAYS Performed at [facility] [address], [place], [place] [address]    Report Status 01/03/2021 FINAL  Final  Resp Panel by RT-PCR (Flu A&B, Covid) Nasopharyngeal Swab     Status: Abnormal   Collection Time: 12/29/20  7:24 PM   Specimen: Nasopharyngeal Swab; Nasopharyngeal(NP) swabs in vial transport medium  Result Value Ref Range Status   SARS Coronavirus 2 by RT PCR POSITIVE (A) NEGATIVE Final    Comment: RESULT CALLED TO, READ BACK BY AND VERIFIED WITH: Y,PATTERSON @2014  12/29/20 EB (NOTE) SARS-CoV-2 target nucleic acids are DETECTED.  The SARS-CoV-2 RNA is generally detectable in upper respiratory specimens during the acute phase of infection. Positive results are indicative of the presence of the identified virus, but do not rule out bacterial infection or co-infection with other pathogens not detected by the test. Clinical correlation with patient history and other diagnostic information is necessary to determine patient infection status. The expected result is Negative.  Fact Sheet for Patients: EntrepreneurPulse.com.au  Fact Sheet for Healthcare Providers: IncredibleEmployment.be  This test is not yet  approved or cleared by the [place] FDA and  has been authorized for detection and/or diagnosis of SARS-CoV-2 by FDA under an Emergency Use Authorization (EUA).  This EUA will remain in effect (meaning this test can be used ) for the duration of  the COVID-19 declaration under Section 564(b)(1) of the Act, 21 U.S.C. section 360bbb-3(b)(1), unless the authorization is terminated or revoked sooner.     Influenza A by PCR NEGATIVE NEGATIVE Final   Influenza B by PCR NEGATIVE NEGATIVE Final    Comment: (NOTE) The  Xpert Xpress SARS-CoV-2/FLU/RSV plus assay is intended as an aid in the diagnosis of influenza from Nasopharyngeal swab specimens and should not be used as a sole basis for treatment. Nasal washings and aspirates are unacceptable for Xpert Xpress SARS-CoV-2/FLU/RSV testing.  Fact Sheet for Patients: EntrepreneurPulse.com.au  Fact Sheet for Healthcare Providers: IncredibleEmployment.be  This test is not yet approved or cleared by the [place] FDA and has been authorized for detection and/or diagnosis of SARS-CoV-2 by FDA under an Emergency Use Authorization (EUA). This EUA will remain in effect (meaning this test can be used) for the duration of the COVID-19 declaration under Section 564(b)(1) of the Act, 21 U.S.C. section 360bbb-3(b)(1), unless the authorization is terminated or revoked.  Performed at [facility], [place] [address] [place], [place] [address]   Resp Panel by RT-PCR (Flu A&B, Covid) Nasopharyngeal Swab     Status: Abnormal   Collection Time: 01/04/21  4:52 PM   Specimen: Nasopharyngeal Swab; Nasopharyngeal(NP) swabs in vial transport medium  Result Value Ref Range Status   SARS Coronavirus 2 by RT PCR POSITIVE (A) NEGATIVE Final    Comment: RESULT CALLED TO, READ BACK BY AND VERIFIED WITH: D,SIDBURY @1841  01/04/21 EB (NOTE) SARS-CoV-2 target nucleic acids are DETECTED.  The SARS-CoV-2 RNA is generally  detectable in upper respiratory specimens during the acute phase of infection. Positive results are indicative of the presence of the identified virus, but do not rule out bacterial infection or co-infection with other pathogens not detected by the test. Clinical correlation with patient history and other diagnostic information is necessary to determine patient infection status. The expected result is Negative.  Fact Sheet for Patients: EntrepreneurPulse.com.au  Fact Sheet for Healthcare Providers: IncredibleEmployment.be  This test is not yet approved or cleared by the [place] FDA and  has been authorized for detection and/or diagnosis of SARS-CoV-2 by FDA under an Emergency Use Authorization (EUA).  This EUA will remain in effect (meaning this test can be used) f or the duration of  the COVID-19 declaration under Section 564(b)(1) of the Act, 21 U.S.C. section 360bbb-3(b)(1), unless the authorization is terminated or revoked sooner.     Influenza A by PCR NEGATIVE NEGATIVE Final   Influenza B by PCR NEGATIVE NEGATIVE Final    Comment: (NOTE) The Xpert Xpress SARS-CoV-2/FLU/RSV plus assay is intended as an aid in the diagnosis of influenza from Nasopharyngeal swab specimens and should not be used as a sole basis for treatment. Nasal washings and aspirates are unacceptable for Xpert Xpress SARS-CoV-2/FLU/RSV testing.  Fact Sheet for Patients: EntrepreneurPulse.com.au  Fact Sheet for Healthcare Providers: IncredibleEmployment.be  This test is not yet approved or cleared by the [place] FDA and has been authorized for detection and/or diagnosis of SARS-CoV-2 by FDA under an Emergency Use Authorization (EUA). This EUA will remain in effect (meaning this test can be used) for the duration of the COVID-19 declaration under Section 564(b)(1) of the Act, 21 U.S.C. section 360bbb-3(b)(1), unless the  authorization is terminated or revoked.  Performed at [facility], [place] [address] [place], [place] [address]     RADIOLOGY STUDIES/RESULTS: DG Chest Port 1 View  Result Date: 01/04/2021 CLINICAL DATA:  Altered mental status. Patient had difficulty tolerating the exam. EXAM: PORTABLE CHEST 1 VIEW COMPARISON:  Chest radiograph 12/30/2020. lung bases from abdominal CT 12/30/2020, Chest CT 08/16/2021 FINDINGS: Right costophrenic angle is excluded from the field of view despite 2 acquisitions. This includes the area of chronically loculated pleural fluid. Normal  heart size and mediastinal contours. Subsegmental atelectasis at the left lung base. Chronic interstitial coarsening, overall low lung volumes. No pneumothorax. Osseous structures are grossly intact. Remote left rib fractures. IMPRESSION: 1. Subsegmental atelectasis at the left lung base. 2. Chronic small loculated right pleural effusion is not included in the field of view, patient had difficulty tolerating the exam. Electronically Signed   By: Keith Rake M.D.   On: 01/04/2021 17:15   DG Abd Portable 1V  Result Date: 01/04/2021 CLINICAL DATA:  NG tube in chest EXAM: PORTABLE ABDOMEN - 1 VIEW COMPARISON:  Radiograph 01/04/2021 FINDINGS: Transesophageal tube tip again projects over left upper quadrant with the side port remaining at the GE junction. Does not appear to have been significantly reduced advanced. Telemetry leads overlie the chest. Slightly improved lung volumes on this examination. Some coarsened interstitial changes are similar to prior. No visible pneumothorax. Right pleural effusion is again seen. No visible left effusion. Remote left rib fractures are again noted. No acute osseous or soft tissue abnormalities. IMPRESSION: 1. Transesophageal tube tip again projects over the left upper quadrant with the side port remaining at the GE junction. No significant interval change. 2. Improved lung volumes without other  significant interval change. Electronically Signed   By: Lovena Le M.D.   On: 01/04/2021 23:44   DG Abd Portable 1V  Result Date: 01/04/2021 CLINICAL DATA:  NG tube placement EXAM: PORTABLE ABDOMEN - 1 VIEW COMPARISON:  Chest radiograph 01/04/2021, swallow study 12/30/2020, CT abdomen pelvis 12/22/2020 FINDINGS: Radiograph encompasses the chest and upper abdomen. A transesophageal tube tip terminates in the left upper quadrant, in the vicinity of the gastric body. Side port positioned at the GE junction. Recommend advancing 3-5 cm for optimal function. Telemetry leads and external support devices overlie the chest. Some bibasilar atelectatic changes and elevation of the left hemidiaphragm are quite similar to recent x-ray. Chronic interstitial coarsening and bronchitic changes are present as well. Stable cardiomediastinal contours accounting for differences in positioning and technique. Included bowel demonstrate some retained high attenuation enteric contrast media/barium from recent swallow study. Could reflect slowed intestinal transit given the time since the prior exam (12/30/2020). Spinal curvature is similar to prior exams. Multiple remote appearing rib fractures are again seen on the left. Degenerative changes in the shoulders and spine. IMPRESSION: A transesophageal tube tip terminates in the left upper quadrant, in the vicinity of the gastric body. Side port positioned at the GE junction. Recommend advancing 3-5 cm for optimal function. Electronically Signed   By: Lovena Le M.D.   On: 01/04/2021 22:58   IR ABDOMEN US LIMITED  Result Date: 01/03/2021 CLINICAL DATA:  History of recurrent symptomatic abdominal ascites. EXAM: LIMITED ABDOMEN ULTRASOUND FOR ASCITES TECHNIQUE: Limited ultrasound survey for ascites was performed in all four abdominal quadrants. COMPARISON:  12/31/2020 FINDINGS: Survey ultrasound showed small volume abdominal ascites. No safe pocket for therapeutic paracentesis.  IMPRESSION: Small volume ascites.  Paracentesis deferred. Electronically Signed   By: Lucrezia Europe M.D.   On: 01/03/2021 15:56     LOS: 1 day   Oren Binet, MD  [facility]    To contact the attending provider between 7A-7P or the covering provider during after hours 7P-7A, please log into the web site [email or web address] and access using universal [place] password for that web site. If you do not have the password, please call the hospital operator.  01/05/2021, 10:18 AM

## 2021-01-06 ENCOUNTER — Telehealth: Payer: Self-pay

## 2021-01-06 DIAGNOSIS — Z515 Encounter for palliative care: Secondary | ICD-10-CM | POA: Diagnosis not present

## 2021-01-06 DIAGNOSIS — Z7189 Other specified counseling: Secondary | ICD-10-CM

## 2021-01-06 DIAGNOSIS — R188 Other ascites: Secondary | ICD-10-CM

## 2021-01-06 DIAGNOSIS — U071 COVID-19: Secondary | ICD-10-CM | POA: Diagnosis not present

## 2021-01-06 DIAGNOSIS — K7290 Hepatic failure, unspecified without coma: Secondary | ICD-10-CM | POA: Diagnosis not present

## 2021-01-06 NOTE — Progress Notes (Signed)
   Daily Progress Note   Patient Name: Megan Roberson       Date: 01/06/2021 DOB: [birth year]  Age: 65 y.o. MRN#: [id number] Attending Physician: Jonetta Osgood, MD Primary Care Physician: Tammi Sou, MD Admit Date: 01/04/2021  Reason for Consultation/Follow-up: Establishing goals of care, Non pain symptom management, Pain control and Psychosocial/spiritual support  Subjective: Chart Reviewed. Updates Received.   Patient resting. No acute distress.   Spoke with husband who is at the bedside. Mr. Shibley is tearful expressing his appreciation of patient's comfortable status. He states she has suffered for more than 14 years and deserves peace. Emotional support provided. Husband reports family members are arriving over the next 24hrs to provide support. He is grateful for family and the medical team during this difficult time.   Education on symptom management and outpatient hospice in the setting of stable symptoms. Mr. Dodgen verbalized understanding. We discussed residential hospice's goals and philosophy. He is in agreement to transfer if symptoms are stable. Husband states [facility] location is closes to him and family.   All questions answered and support provided.   Length of Stay: 2 days  Vital Signs: BP (!) 110/57 (BP Location: Right Leg)   Pulse 76   Temp (!) 97.4 F (36.3 C) (Axillary)   Resp 16   Wt 43.8 kg   SpO2 100%   BMI 15.12 kg/m  SpO2: SpO2: 100 % O2 Device: O2 Device: Room Air O2 Flow Rate: O2 Flow Rate (L/min): 1 L/min  Physical Exam: No acute distress, somnolent         Palliative Care Assessment & Plan  HPI: Per colleague Megan Roberson, [name]: 65 y.o. female  with past medical history of NASH cirrhosis, with recurrent hepatic encephalopathy, ACS, hypothyroidism, chronic abdominal pain, severe protein calorie malnutrition who was admitted on 01/04/2021 with altered mental status.  She was obtunded and did not respond to pain.  Her ammonia level last evening  was 253.  Wbc 18.5Albumin 2.9, Tbili 5.7, PT 18.8, INR 1.6.  Her last U/A on 2/28 was indicative of UTI with many bacteria and leukocytes.  She was just discharged on 3/3 after an admission for similar symptoms.  She has had 4 admissions in the last six months.  Code Status: DNR  Goals of Care/Recommendations: Continue comfort measures Education provided on continued symptom management and residential hospice. Husband verbalized request for [facility]. hospice home.  Husband emotional. Support provided. Family arriving to provide additional support.  PMT will continue to support and follow   Prognosis: days-weeks  Discharge Planning: Hospice facility  Thank you for allowing the Palliative Medicine Team to assist in the care of this patient.  Time Total: 40 min.   Visit consisted of counseling and education dealing with the complex and emotionally intense issues of symptom management and palliative care in the setting of serious and potentially life-threatening illness.Greater than 50%  of this time was spent counseling and coordinating care related to the above assessment and plan.  Alda Lea, AGPCNP-BC  Palliative Medicine Team [phone]

## 2021-01-06 NOTE — Progress Notes (Addendum)
   01/06/21 1250  Clinical Encounter Type  Visited With Patient  Visit Type Patient actively dying;Other (Comment) (Per consultation: End of life)  Referral From Nurse  Consult/Referral To Chaplain  Chaplain responded to the consultation. The patient appeared to be sleep. The patient's husband was not in the room. Chaplain offered prayer. Chaplain remains available if needed. This note was prepared by Jeanine Luz, M.Div..  For questions please contact by phone [phone].

## 2021-01-06 NOTE — Progress Notes (Signed)
PROGRESS NOTE        PATIENT DETAILS Name: Megan Roberson Age: 65 y.o. Sex: female Date of Birth: Apr 05, 1956 Admit Date: 01/04/2021 Admitting Physician Elwyn Reach, MD VZD:GLOVFIE, Adrian Blackwater, MD  Brief Narrative: Patient is a 65 y.o. female ESRD, hypothyroidism-just discharged on 3/4-after being treated for hepatic encephalopathy-presented to the hospital on 3/5 with severe confusion/encephalopathy-thought to be due to recurrent hepatic encephalopathy.    Significant events: 2/27-3/4>> hospitalized for acute hepatic encephalopathy, incidental COVID-19 infection 3/5>> admit for severe hepatic encephalopathy 3/6>> transitioned to comfort measures  Significant studies: 3/5>> chest x-ray: Chronic loculated right sided pleural effusion  Antimicrobial therapy: None  Microbiology data: None  Procedures : None  Consults: Palliative care  DVT Prophylaxis : Not needed-comfort measures in effect  Subjective: Lying comfortably in bed-no major issues overnight.  Full comfort measures in effect.  Per nursing staff-no oral intake since admission.  NG tube removed yesterday.   Assessment/Plan: Hepatic encephalopathy: Remains confused-after discussion with family by this MD/palliative care team-has been transitioned to full comfort measures.    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  Chronic right-sided pleural effusion: Asymptomatic-chronic issue-supportive care  Recurrent ascites: No role for paracentesis-comfort measures in effect.  Per most recent IR note on 3/4-only minimal ascites seen-and not amenable for paracentesis.  Thrombocytopenia: Secondary to liver  cirrhosis/hypersplenism-follow closely.  Hypothyroidism: Switch to IV levothyroxine given severe encephalopathy and inability to take oral intake.  Palliative care: Has end-stage liver cirrhosis-not a candidate for liver transplant/TIPS procedure (per prior GI note)-noncompliant to medications-very poor overall prognosis.  After extensive discussion with spouse by this MD-and subsequently by palliative care team-DNR in place-and now under full comfort measures.  No oral intake since admission-remains obtunded-suspect life expectancy is is a few days at best.  COVID-19 infection: Incidental-10 days of isolation from day of first positive test.  No therapy is required.   Lab Results  Component Value Date   SARSCOV2NAA POSITIVE (A) 01/04/2021   SARSCOV2NAA POSITIVE (A) 12/29/2020   SARSCOV2NAA NEGATIVE 11/11/2020   [place] NEGATIVE 09/03/2020    Diet: Diet Order            Diet NPO time specified  Diet effective now                  Code Status: DNR  Family Communication: Spouse-Anthony-[phone]-called on 3/7-no response.  Palliative care to reach out to family later today.    Disposition Plan: Status is: Inpatient  Remains inpatient appropriate because:Inpatient level of care appropriate due to severity of illness   Dispo: The patient is from: Home              Anticipated d/c is to: Home              Patient currently stable for discharge-awaiting residential hospice   Difficult to place patient No    Barriers to Discharge: Awaiting residential hospice-full comfort measures in place.  Antimicrobial agents: Anti-infectives (From admission, onward)   Start     Dose/Rate Route Frequency Ordered Stop   01/04/21 2245  rifaximin (XIFAXAN) tablet 550 mg  Status:  Discontinued  550 mg Oral 2 times daily 01/04/21 2150 01/05/21 1549       Time spent: 15- minutes-Greater than 50% of this time was spent in counseling, explanation of diagnosis, planning of  further management, and coordination of care.  MEDICATIONS: Scheduled Meds: . fentaNYL (SUBLIMAZE) injection  25 mcg Intravenous Q4H  . LORazepam  0.5 mg Intravenous BID   Continuous Infusions: PRN Meds:.   PHYSICAL EXAM: Vital signs: Vitals:   01/05/21 2100 01/06/21 0120 01/06/21 0450 01/06/21 0720  BP: 105/66 92/64 124/77 (!) 110/57  Pulse: 74 70 82 76  Resp: 20 18 20 16   Temp:    (!) 97.4 F (36.3 C)  TempSrc:    Axillary  SpO2:    100%  Weight:       Filed Weights   01/04/21 2120  Weight: 43.8 kg   Body mass index is 15.12 kg/m.   Lying comfortably Still confused  I have personally reviewed following labs and imaging studies  LABORATORY DATA: CBC: Recent Labs  Lab 12/31/20 0123 01/01/21 0219 01/02/21 0108 01/03/21 0111 01/04/21 1705 01/05/21 0226  WBC 5.4 6.5 10.9* 13.4* 18.5* 11.6*  NEUTROABS 3.9 5.0 8.9* 10.7* 15.4*  --   HGB 7.2* 8.5* 9.2* 8.6* 10.3* 8.7*  HCT 23.7* 28.0* 31.2* 28.0* 34.8* 28.3*  MCV 89.1 90.0 91.5 90.3 93.0 91.0  PLT 40* 50* 69* 92* 167 71*    Basic Metabolic Panel: Recent Labs  Lab 12/31/20 0123 01/01/21 0219 01/02/21 0108 01/03/21 0111 01/04/21 1705 01/05/21 0226  NA 136 134* 133* 131* 135 134*  K 4.2 4.1 4.3 4.7 5.4* 3.7  CL 106 100 100 100 98 100  CO2 21* 25 23 18* 18* 19*  GLUCOSE 89 130* 107* 81 85 77  BUN 11 8 12 19 19 20   CREATININE 0.76 0.80 0.88 0.96 1.27* 1.16*  CALCIUM 8.9 8.9 8.9 8.8* 9.6 8.6*  MG 2.0 2.1 2.2 2.3  --   --     GFR: Estimated Creatinine Clearance: 34.8 mL/min (A) (by C-G formula based on SCr of 1.16 mg/dL (H)).  Liver Function Tests: Recent Labs  Lab 01/01/21 0219 01/02/21 0108 01/03/21 0111 01/04/21 1705 01/05/21 0226  AST 38 37 43* 101* 124*  ALT 16 20 19 29 29   ALKPHOS 94 102 92 120 94  BILITOT 4.4* 5.9* 5.3* 7.0* 5.7*  PROT 5.6* 6.1* 5.8* 6.7 5.4*  ALBUMIN 3.1* 3.2* 3.0* 3.6 2.9*   No results for input(s): LIPASE, AMYLASE in the last 168 hours. Recent Labs  Lab  12/31/20 0123 01/01/21 0219 01/02/21 0108 01/03/21 0111 01/04/21 1705  AMMONIA 60* 71* 61* 126* 253*    Coagulation Profile: Recent Labs  Lab 01/04/21 1705  INR 1.6*    Cardiac Enzymes: No results for input(s): CKTOTAL, CKMB, CKMBINDEX, TROPONINI in the last 168 hours.  BNP (last 3 results) No results for input(s): PROBNP in the last 8760 hours.  Lipid Profile: No results for input(s): CHOL, HDL, LDLCALC, TRIG, CHOLHDL, LDLDIRECT in the last 72 hours.  Thyroid Function Tests: No results for input(s): TSH, T4TOTAL, FREET4, T3FREE, THYROIDAB in the last 72 hours.  Anemia Panel: No results for input(s): VITAMINB12, FOLATE, FERRITIN, TIBC, IRON, RETICCTPCT in the last 72 hours.  Urine analysis:    Component Value Date/Time   COLORURINE AMBER (A) 12/30/2020 1129   APPEARANCEUR HAZY (A) 12/30/2020 1129   LABSPEC 1.031 (H) 12/30/2020 1129   PHURINE 7.0 12/30/2020 1129   GLUCOSEU NEGATIVE 12/30/2020 1129   HGBUR LARGE (A) 12/30/2020  [place] 12/30/2020 1129   BILIRUBINUR postive 12/09/2020 [place] 12/30/2020 1129   PROTEINUR 30 (A) 12/30/2020 1129   UROBILINOGEN 2.0 (A) 12/09/2020 1519   NITRITE NEGATIVE 12/30/2020 1129   LEUKOCYTESUR MODERATE (A) 12/30/2020 1129    Sepsis Labs: Lactic Acid, Venous    Component Value Date/Time   LATICACIDVEN 2.3 ([place]) 11/02/2020 1639    MICROBIOLOGY: Recent Results (from the past 240 hour(s))  Gram stain     Status: None   Collection Time: 12/29/20  5:18 PM   Specimen: Peritoneal Washings; Peritoneal Fluid  Result Value Ref Range Status   Specimen Description PERITONEAL  Final   Special Requests NONE  Final   Gram Stain   Final    WBC PRESENT,BOTH PMN AND MONONUCLEAR NO ORGANISMS SEEN CYTOSPIN SMEAR Performed at [facility], [address], [place], [place] [address]    Report Status 12/29/2020 FINAL  Final  Culture, body fluid w Gram Stain-bottle     Status: None   Collection  Time: 12/29/20  5:38 PM   Specimen: Peritoneal Washings  Result Value Ref Range Status   Specimen Description PERITONEAL  Final   Special Requests PERITONEAL FLD  Final   Culture   Final    NO GROWTH 5 DAYS Performed at [facility] [address] [place], [place] [address]    Report Status 01/03/2021 FINAL  Final  Resp Panel by RT-PCR (Flu A&B, Covid) Nasopharyngeal Swab     Status: Abnormal   Collection Time: 12/29/20  7:24 PM   Specimen: Nasopharyngeal Swab; Nasopharyngeal(NP) swabs in vial transport medium  Result Value Ref Range Status   SARS Coronavirus 2 by RT PCR POSITIVE (A) NEGATIVE Final    Comment: RESULT CALLED TO, READ BACK BY AND VERIFIED WITH: Y,PATTERSON @2014  12/29/20 EB (NOTE) SARS-CoV-2 target nucleic acids are DETECTED.  The SARS-CoV-2 RNA is generally detectable in upper respiratory specimens during the acute phase of infection. Positive results are indicative of the presence of the identified virus, but do not rule out bacterial infection or co-infection with other pathogens not detected by the test. Clinical correlation with patient history and other diagnostic information is necessary to determine patient infection status. The expected result is Negative.  Fact Sheet for Patients: EntrepreneurPulse.com.au  Fact Sheet for Healthcare Providers: IncredibleEmployment.be  This test is not yet approved or cleared by the [place] FDA and  has been authorized for detection and/or diagnosis of SARS-CoV-2 by FDA under an Emergency Use Authorization (EUA).  This EUA will remain in effect (meaning this test can be used ) for the duration of  the COVID-19 declaration under Section 564(b)(1) of the Act, 21 U.S.C. section 360bbb-3(b)(1), unless the authorization is terminated or revoked sooner.     Influenza A by PCR NEGATIVE NEGATIVE Final   Influenza B by PCR NEGATIVE NEGATIVE Final    Comment: (NOTE) The Xpert  Xpress SARS-CoV-2/FLU/RSV plus assay is intended as an aid in the diagnosis of influenza from Nasopharyngeal swab specimens and should not be used as a sole basis for treatment. Nasal washings and aspirates are unacceptable for Xpert Xpress SARS-CoV-2/FLU/RSV testing.  Fact Sheet for Patients: EntrepreneurPulse.com.au  Fact Sheet for Healthcare Providers: IncredibleEmployment.be  This test is not yet approved or cleared by the [place] FDA and has been authorized for detection and/or diagnosis of SARS-CoV-2 by FDA under an Emergency Use Authorization (EUA). This EUA will remain in effect (meaning this test can be  used) for the duration of the COVID-19 declaration under Section 564(b)(1) of the Act, 21 U.S.C. section 360bbb-3(b)(1), unless the authorization is terminated or revoked.  Performed at [facility], [place] [address], [place], [place] [address]   Resp Panel by RT-PCR (Flu A&B, Covid) Nasopharyngeal Swab     Status: Abnormal   Collection Time: 01/04/21  4:52 PM   Specimen: Nasopharyngeal Swab; Nasopharyngeal(NP) swabs in vial transport medium  Result Value Ref Range Status   SARS Coronavirus 2 by RT PCR POSITIVE (A) NEGATIVE Final    Comment: RESULT CALLED TO, READ BACK BY AND VERIFIED WITH: D,SIDBURY @1841  01/04/21 EB (NOTE) SARS-CoV-2 target nucleic acids are DETECTED.  The SARS-CoV-2 RNA is generally detectable in upper respiratory specimens during the acute phase of infection. Positive results are indicative of the presence of the identified virus, but do not rule out bacterial infection or co-infection with other pathogens not detected by the test. Clinical correlation with patient history and other diagnostic information is necessary to determine patient infection status. The expected result is Negative.  Fact Sheet for Patients: EntrepreneurPulse.com.au  Fact Sheet for Healthcare  Providers: IncredibleEmployment.be  This test is not yet approved or cleared by the [place] FDA and  has been authorized for detection and/or diagnosis of SARS-CoV-2 by FDA under an Emergency Use Authorization (EUA).  This EUA will remain in effect (meaning this test can be used) f or the duration of  the COVID-19 declaration under Section 564(b)(1) of the Act, 21 U.S.C. section 360bbb-3(b)(1), unless the authorization is terminated or revoked sooner.     Influenza A by PCR NEGATIVE NEGATIVE Final   Influenza B by PCR NEGATIVE NEGATIVE Final    Comment: (NOTE) The Xpert Xpress SARS-CoV-2/FLU/RSV plus assay is intended as an aid in the diagnosis of influenza from Nasopharyngeal swab specimens and should not be used as a sole basis for treatment. Nasal washings and aspirates are unacceptable for Xpert Xpress SARS-CoV-2/FLU/RSV testing.  Fact Sheet for Patients: EntrepreneurPulse.com.au  Fact Sheet for Healthcare Providers: IncredibleEmployment.be  This test is not yet approved or cleared by the [place] FDA and has been authorized for detection and/or diagnosis of SARS-CoV-2 by FDA under an Emergency Use Authorization (EUA). This EUA will remain in effect (meaning this test can be used) for the duration of the COVID-19 declaration under Section 564(b)(1) of the Act, 21 U.S.C. section 360bbb-3(b)(1), unless the authorization is terminated or revoked.  Performed at [facility], [place] [address] [place], [place] [address]     RADIOLOGY STUDIES/RESULTS: DG Chest Port 1 View  Result Date: 01/04/2021 CLINICAL DATA:  Altered mental status. Patient had difficulty tolerating the exam. EXAM: PORTABLE CHEST 1 VIEW COMPARISON:  Chest radiograph 12/30/2020. lung bases from abdominal CT 12/30/2020, Chest CT 08/16/2021 FINDINGS: Right costophrenic angle is excluded from the field of view despite 2 acquisitions. This  includes the area of chronically loculated pleural fluid. Normal heart size and mediastinal contours. Subsegmental atelectasis at the left lung base. Chronic interstitial coarsening, overall low lung volumes. No pneumothorax. Osseous structures are grossly intact. Remote left rib fractures. IMPRESSION: 1. Subsegmental atelectasis at the left lung base. 2. Chronic small loculated right pleural effusion is not included in the field of view, patient had difficulty tolerating the exam. Electronically Signed   By: Keith Rake M.D.   On: 01/04/2021 17:15   DG Abd Portable 1V  Result Date: 01/04/2021 CLINICAL DATA:  NG tube in chest EXAM: PORTABLE ABDOMEN - 1 VIEW  COMPARISON:  Radiograph 01/04/2021 FINDINGS: Transesophageal tube tip again projects over left upper quadrant with the side port remaining at the GE junction. Does not appear to have been significantly reduced advanced. Telemetry leads overlie the chest. Slightly improved lung volumes on this examination. Some coarsened interstitial changes are similar to prior. No visible pneumothorax. Right pleural effusion is again seen. No visible left effusion. Remote left rib fractures are again noted. No acute osseous or soft tissue abnormalities. IMPRESSION: 1. Transesophageal tube tip again projects over the left upper quadrant with the side port remaining at the GE junction. No significant interval change. 2. Improved lung volumes without other significant interval change. Electronically Signed   By: Lovena Le M.D.   On: 01/04/2021 23:44   DG Abd Portable 1V  Result Date: 01/04/2021 CLINICAL DATA:  NG tube placement EXAM: PORTABLE ABDOMEN - 1 VIEW COMPARISON:  Chest radiograph 01/04/2021, swallow study 12/30/2020, CT abdomen pelvis 12/22/2020 FINDINGS: Radiograph encompasses the chest and upper abdomen. A transesophageal tube tip terminates in the left upper quadrant, in the vicinity of the gastric body. Side port positioned at the GE junction. Recommend  advancing 3-5 cm for optimal function. Telemetry leads and external support devices overlie the chest. Some bibasilar atelectatic changes and elevation of the left hemidiaphragm are quite similar to recent x-ray. Chronic interstitial coarsening and bronchitic changes are present as well. Stable cardiomediastinal contours accounting for differences in positioning and technique. Included bowel demonstrate some retained high attenuation enteric contrast media/barium from recent swallow study. Could reflect slowed intestinal transit given the time since the prior exam (12/30/2020). Spinal curvature is similar to prior exams. Multiple remote appearing rib fractures are again seen on the left. Degenerative changes in the shoulders and spine. IMPRESSION: A transesophageal tube tip terminates in the left upper quadrant, in the vicinity of the gastric body. Side port positioned at the GE junction. Recommend advancing 3-5 cm for optimal function. Electronically Signed   By: Lovena Le M.D.   On: 01/04/2021 22:58     LOS: 2 days   Oren Binet, MD  [facility]    To contact the attending provider between 7A-7P or the covering provider during after hours 7P-7A, please log into the web site [email or web address] and access using universal [place] password for that web site. If you do not have the password, please call the hospital operator.  01/06/2021, 11:31 AM

## 2021-01-06 NOTE — Telephone Encounter (Signed)
FYI, Pt admitted at [facility] since 01/04/21  [place] [facility] [place] RECORD AccessNurse Patient Name: JAKYRAH HOLLADAY Gender: Female DOB: 15-Sep-1956 Age: 65 Y 8 M 17 D Return Phone Number: [phone] (Primary) Address: City/State/Zip: [place] [address] Client [facility] - Client Client Site [place] - Day Physician Crissie Sickles - MD Contact Type Call Who Is Calling Patient / Member / Family / Caregiver Call Type Triage / Clinical Caller Name ANTHONY Lunde Relationship To Patient Spouse Return Phone Number [phone] (Primary) Chief Complaint Weakness, Generalized Reason for Call Symptomatic / Request for [place] states his wife is home form the hospital. She is restless and keeps trying to get out of bed, she is very weak and cannot be on her feet right now. He wants something to keep her in bed and to calm her down. Translation No Nurse Assessment Nurse: Reasor, RN, Leah Date/Time (Eastern Time): 01/04/2021 11:53:38 AM Confirm and document reason for call. If symptomatic, describe symptoms. ---Caller states pt dc'd from hospital yesterday for liver disease, is restless and keeps trying to get out of bed. Caller states pt is very weak and cannot be on her feet right now, wants something to keep her in bed and to calm her down. Caller states pt is still confused and disoriented, almost combative. Does the patient have any new or worsening symptoms? ---Yes Will a triage be completed? ---Yes Related visit to physician within the last 2 weeks? ---Yes Does the PT have any chronic conditions? (i.e. diabetes, asthma, this includes High risk factors for pregnancy, etc.) ---Yes List chronic conditions. ---end-stage liver disease Is this a behavioral health or substance abuse call? ---No Guidelines Guideline Title Affirmed Question Affirmed Notes Nurse Date/Time  (Eastern Time) Confusion - Delirium [1] Difficult to awaken or acting confused (e.g., disoriented, slurred speech) AND [2] present now AND [3] new-onset Reasor, RN, Denny Peon 01/04/2021 11:59:11 AM PLEASE NOTE: All timestamps contained within this report are represented as [place] Standard Time. CONFIDENTIALTY NOTICE: This fax transmission is intended only for the addressee. It contains information that is legally privileged, confidential or otherwise protected from use or disclosure. If you are not the intended recipient, you are strictly prohibited from reviewing, disclosing, copying using or disseminating any of this information or taking any action in reliance on or regarding this information. If you have received this fax in error, please notify us immediately by telephone so that we can arrange for its return to [place]. Phone: [phone], Toll-Free: [phone], Fax: [phone] Page: 2 of 2 Call Id: [id number] [place]. Time Eilene Ghazi Time) Disposition Final User 01/04/2021 12:05:19 PM Called On-Call Provider Reasor, RN, Denny Peon 01/04/2021 12:09:28 PM 911 Outcome Documentation Reasor, RN, Denny Peon Reason: Caller refusing to send pt back to ED, OC MD notified and concurs with outcome disposition. Caller informed. 01/04/2021 12:08:50 PM Call EMS 911 Now Yes Reasor, RN, Denny Peon Caller Disagree/Comply Disagree Caller Understands Yes PreDisposition InappropriateToAsk Care Advice Given Per Guideline CALL EMS 911 NOW: CARE ADVICE given per Confusion-Delirium (Adult) guideline. Paging DoctorName Phone DateTime Result/Outcome Message Type Notes Dimas Chyle [phone] 01/04/2021 12:05:19 PM Called On Call Provider - Reached Doctor Paged Dimas Chyle 01/04/2021 12:08:43 PM Spoke with On Call - General Message Result OC MD recommends pt return to ED

## 2021-01-06 NOTE — Progress Notes (Signed)
TOC consulted for residential hospice in [place] called and spoke to [place] with [facility] and she states they wont be able to accept the patient until 21 days past her + covid test. CM informed her that the patient is asymptomatic with covid and she states it would still need to be the 21 days. MD updated.  TOC following.

## 2021-01-07 DIAGNOSIS — K7290 Hepatic failure, unspecified without coma: Secondary | ICD-10-CM | POA: Diagnosis not present

## 2021-01-07 DIAGNOSIS — Z515 Encounter for palliative care: Secondary | ICD-10-CM | POA: Diagnosis not present

## 2021-01-07 DIAGNOSIS — U071 COVID-19: Secondary | ICD-10-CM | POA: Diagnosis not present

## 2021-01-07 NOTE — TOC Progression Note (Addendum)
Transition of Care [facility]) - Progression Note    Patient Details  Name: Megan Roberson MRN: [id number] Date of Birth: Jun 16, 1956  Transition of [facility]) CM/SW [place], LCSW Phone Number: 01/07/2021, 1:06 PM  Clinical Narrative:    CSW spoke with patient's spouse regarding hospice facilities. CSW explained that due to COVID, [facility] is unable to accept patient. CSW offered COVID unit options of [facility] or [facility]. Spouse stated [facility] would be acceptable due to patient's family members being there. CSW provided supportive listening; spouse requests to be notified of any changes day or night. Referral sent to [facility] [place].    Expected Discharge Plan: [place] Barriers to Discharge: Hospice Bed not available  Expected Discharge Plan and Services Expected Discharge Plan: [place] In-house Referral: Clinical Social Work,Hospice / [place] Acute Care Choice: Hospice Living arrangements for the past 2 months: [place]: [facility] Date [place]: 01/07/21 Time [place]: [place] Representative spoke with at [place]: [place] ([place]) Interventions    Readmission Risk Interventions Readmission Risk Prevention Plan 01/07/2021 09/05/2020 08/14/2020  Transportation Screening Complete Complete Complete  PCP or Specialist Appt within 3-5 Days - Complete Not Complete  Not Complete comments - - Anticipating discharge to SNF  [place] or [place] - Complete Complete  Social Work Consult for [place] Planning/Counseling - Complete Complete  Palliative Care Screening - Complete Not Applicable  Medication Review Press photographer) Referral to Pharmacy Complete Complete  PCP or Specialist appointment within 3-5 days of discharge Complete - -  [place] or Home Care Consult  Complete - -  SW Recovery Care/Counseling Consult Complete - -  Palliative Care Screening Complete - -  [place] Not Applicable - -  Some recent data might be hidden

## 2021-01-07 NOTE — Progress Notes (Signed)
PROGRESS NOTE        PATIENT DETAILS Name: Megan Roberson Age: 65 y.o. Sex: female Date of Birth: 1955/12/25 Admit Date: 01/04/2021 Admitting Physician Elwyn Reach, MD [id number], Adrian Blackwater, MD  Brief Narrative: Patient is a 65 y.o. female ESRD, hypothyroidism-just discharged on 3/4-after being treated for hepatic encephalopathy-presented to the hospital on 3/5 with severe confusion/encephalopathy-thought to be due to recurrent hepatic encephalopathy.    Significant events: 2/27-3/4>> hospitalized for acute hepatic encephalopathy, incidental COVID-19 infection 3/5>> admit for severe hepatic encephalopathy 3/6>> transitioned to comfort measures  Significant studies: 3/5>> chest x-ray: Chronic loculated right sided pleural effusion  Antimicrobial therapy: None  Microbiology data: None  Procedures : None  Consults: Palliative care  DVT Prophylaxis : Not needed-comfort measures in effect  Subjective: Lying comfortably in bed-Per nursing staff-no oral intake since admission.  No major issues overnight.  Assessment/Plan: Hepatic encephalopathy: Remains confused-after discussion with family by this MD/palliative care team-has been transitioned to full comfort measures.    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  Chronic right-sided pleural effusion: Asymptomatic-chronic issue-supportive care  Recurrent ascites: No role for paracentesis-comfort measures in effect.  Per most recent IR note on 3/4-only minimal ascites seen-and not amenable for paracentesis.  Thrombocytopenia: Secondary to liver cirrhosis/hypersplenism-follow closely.  Hypothyroidism: Switch to IV  levothyroxine given severe encephalopathy and inability to take oral intake.  Palliative care: Has end-stage liver cirrhosis-not a candidate for liver transplant/TIPS procedure (per prior GI note)-noncompliant to medications-very poor overall prognosis.  After extensive discussion with spouse by this MD-and subsequently by palliative care team-DNR in place-and now under full comfort measures.  No oral intake since admission-remains obtunded-suspect life expectancy is is a few days at best.  COVID-19 infection: Incidental-10 days of isolation from day of first positive test.  No therapy is required.   Lab Results  Component Value Date   SARSCOV2NAA POSITIVE (A) 01/04/2021   SARSCOV2NAA POSITIVE (A) 12/29/2020   SARSCOV2NAA NEGATIVE 11/11/2020   [place] NEGATIVE 09/03/2020    Diet: Diet Order            Diet NPO time specified  Diet effective now                  Code Status: DNR  Family Communication: Spouse-[phone]-called on 3/7-no response.  Palliative care to reach out to family later today.    Disposition Plan: Status is: Inpatient  Remains inpatient appropriate because:Inpatient level of care appropriate due to severity of illness   Dispo: The patient is from: Home              Anticipated d/c is to: Home              Patient currently stable for discharge-awaiting residential hospice   Difficult to place patient No    Barriers to Discharge: Awaiting residential hospice-full comfort measures in place.  Antimicrobial agents: Anti-infectives (From admission, onward)   Start     Dose/Rate Route Frequency Ordered Stop   01/04/21 2245  rifaximin (XIFAXAN) tablet 550 mg  Status:  Discontinued        550 mg Oral 2 times daily 01/04/21 2150 01/05/21 1549  Time spent: 15- minutes-Greater than 50% of this time was spent in counseling, explanation of diagnosis, planning of further management, and coordination of care.  MEDICATIONS: Scheduled  Meds: . fentaNYL (SUBLIMAZE) injection  25 mcg Intravenous Q4H  . LORazepam  0.5 mg Intravenous BID   Continuous Infusions: PRN Meds:.   PHYSICAL EXAM: Vital signs: Vitals:   01/05/21 2100 01/06/21 0120 01/06/21 0450 01/06/21 0720  BP: 105/66 92/64 124/77 (!) 110/57  Pulse: 74 70 82 76  Resp: 20 18 20 16   Temp:    (!) 97.4 F (36.3 C)  TempSrc:    Axillary  SpO2:    100%  Weight:       Filed Weights   01/04/21 2120  Weight: 43.8 kg   Body mass index is 15.12 kg/m.   Lying comfortably Still confused  I have personally reviewed following labs and imaging studies  LABORATORY DATA: CBC: Recent Labs  Lab 01/01/21 0219 01/02/21 0108 01/03/21 0111 01/04/21 1705 01/05/21 0226  WBC 6.5 10.9* 13.4* 18.5* 11.6*  NEUTROABS 5.0 8.9* 10.7* 15.4*  --   HGB 8.5* 9.2* 8.6* 10.3* 8.7*  HCT 28.0* 31.2* 28.0* 34.8* 28.3*  MCV 90.0 91.5 90.3 93.0 91.0  PLT 50* 69* 92* 167 71*    Basic Metabolic Panel: Recent Labs  Lab 01/01/21 0219 01/02/21 0108 01/03/21 0111 01/04/21 1705 01/05/21 0226  NA 134* 133* 131* 135 134*  K 4.1 4.3 4.7 5.4* 3.7  CL 100 100 100 98 100  CO2 25 23 18* 18* 19*  GLUCOSE 130* 107* 81 85 77  BUN 8 12 19 19 20   CREATININE 0.80 0.88 0.96 1.27* 1.16*  CALCIUM 8.9 8.9 8.8* 9.6 8.6*  MG 2.1 2.2 2.3  --   --     GFR: Estimated Creatinine Clearance: 34.8 mL/min (A) (by C-G formula based on SCr of 1.16 mg/dL (H)).  Liver Function Tests: Recent Labs  Lab 01/01/21 0219 01/02/21 0108 01/03/21 0111 01/04/21 1705 01/05/21 0226  AST 38 37 43* 101* 124*  ALT 16 20 19 29 29   ALKPHOS 94 102 92 120 94  BILITOT 4.4* 5.9* 5.3* 7.0* 5.7*  PROT 5.6* 6.1* 5.8* 6.7 5.4*  ALBUMIN 3.1* 3.2* 3.0* 3.6 2.9*   No results for input(s): LIPASE, AMYLASE in the last 168 hours. Recent Labs  Lab 01/01/21 0219 01/02/21 0108 01/03/21 0111 01/04/21 1705  AMMONIA 71* 61* 126* 253*    Coagulation Profile: Recent Labs  Lab 01/04/21 1705  INR 1.6*     Cardiac Enzymes: No results for input(s): CKTOTAL, CKMB, CKMBINDEX, TROPONINI in the last 168 hours.  BNP (last 3 results) No results for input(s): PROBNP in the last 8760 hours.  Lipid Profile: No results for input(s): CHOL, HDL, LDLCALC, TRIG, CHOLHDL, LDLDIRECT in the last 72 hours.  Thyroid Function Tests: No results for input(s): TSH, T4TOTAL, FREET4, T3FREE, THYROIDAB in the last 72 hours.  Anemia Panel: No results for input(s): VITAMINB12, FOLATE, FERRITIN, TIBC, IRON, RETICCTPCT in the last 72 hours.  Urine analysis:    Component Value Date/Time   COLORURINE AMBER (A) 12/30/2020 1129   APPEARANCEUR HAZY (A) 12/30/2020 1129   LABSPEC 1.031 (H) 12/30/2020 1129   PHURINE 7.0 12/30/2020 1129   GLUCOSEU NEGATIVE 12/30/2020 1129   HGBUR LARGE (A) 12/30/2020 1129   BILIRUBINUR NEGATIVE 12/30/2020 1129   BILIRUBINUR postive 12/09/2020 [place] 12/30/2020 1129   PROTEINUR 30 (A) 12/30/2020 1129   UROBILINOGEN 2.0 (A) 12/09/2020 1519   NITRITE NEGATIVE 12/30/2020  [place] (A) 12/30/2020 1129    Sepsis Labs: Lactic Acid, Venous    Component Value Date/Time   LATICACIDVEN 2.3 (HH) 11/02/2020 1639    MICROBIOLOGY: Recent Results (from the past 240 hour(s))  Gram stain     Status: None   Collection Time: 12/29/20  5:18 PM   Specimen: Peritoneal Washings; Peritoneal Fluid  Result Value Ref Range Status   Specimen Description PERITONEAL  Final   Special Requests NONE  Final   Gram Stain   Final    WBC PRESENT,BOTH PMN AND MONONUCLEAR NO ORGANISMS SEEN CYTOSPIN SMEAR Performed at [facility], [address], [place], [place] [address]    Report Status 12/29/2020 FINAL  Final  Culture, body fluid w Gram Stain-bottle     Status: None   Collection Time: 12/29/20  5:38 PM   Specimen: Peritoneal Washings  Result Value Ref Range Status   Specimen Description PERITONEAL  Final   Special Requests PERITONEAL FLD  Final    Culture   Final    NO GROWTH 5 DAYS Performed at [facility] [address], [place], [place] [address]    Report Status 01/03/2021 FINAL  Final  Resp Panel by RT-PCR (Flu A&B, Covid) Nasopharyngeal Swab     Status: Abnormal   Collection Time: 12/29/20  7:24 PM   Specimen: Nasopharyngeal Swab; Nasopharyngeal(NP) swabs in vial transport medium  Result Value Ref Range Status   SARS Coronavirus 2 by RT PCR POSITIVE (A) NEGATIVE Final    Comment: RESULT CALLED TO, READ BACK BY AND VERIFIED WITH: Y,PATTERSON @2014  12/29/20 EB (NOTE) SARS-CoV-2 target nucleic acids are DETECTED.  The SARS-CoV-2 RNA is generally detectable in upper respiratory specimens during the acute phase of infection. Positive results are indicative of the presence of the identified virus, but do not rule out bacterial infection or co-infection with other pathogens not detected by the test. Clinical correlation with patient history and other diagnostic information is necessary to determine patient infection status. The expected result is Negative.  Fact Sheet for Patients: EntrepreneurPulse.com.au  Fact Sheet for Healthcare Providers: IncredibleEmployment.be  This test is not yet approved or cleared by the [place] FDA and  has been authorized for detection and/or diagnosis of SARS-CoV-2 by FDA under an Emergency Use Authorization (EUA).  This EUA will remain in effect (meaning this test can be used ) for the duration of  the COVID-19 declaration under Section 564(b)(1) of the Act, 21 U.S.C. section 360bbb-3(b)(1), unless the authorization is terminated or revoked sooner.     Influenza A by PCR NEGATIVE NEGATIVE Final   Influenza B by PCR NEGATIVE NEGATIVE Final    Comment: (NOTE) The Xpert Xpress SARS-CoV-2/FLU/RSV plus assay is intended as an aid in the diagnosis of influenza from Nasopharyngeal swab specimens and should not be used as a sole basis for  treatment. Nasal washings and aspirates are unacceptable for Xpert Xpress SARS-CoV-2/FLU/RSV testing.  Fact Sheet for Patients: EntrepreneurPulse.com.au  Fact Sheet for Healthcare Providers: IncredibleEmployment.be  This test is not yet approved or cleared by the [place] FDA and has been authorized for detection and/or diagnosis of SARS-CoV-2 by FDA under an Emergency Use Authorization (EUA). This EUA will remain in effect (meaning this test can be used) for the duration of the COVID-19 declaration under Section 564(b)(1) of the Act, 21 U.S.C. section 360bbb-3(b)(1), unless the authorization is terminated or revoked.  Performed at [facility], [place] [address], [place], [place]  [address]   Resp Panel by RT-PCR (Flu A&B, Covid) Nasopharyngeal Swab     Status: Abnormal   Collection Time: 01/04/21  4:52 PM   Specimen: Nasopharyngeal Swab; Nasopharyngeal(NP) swabs in vial transport medium  Result Value Ref Range Status   SARS Coronavirus 2 by RT PCR POSITIVE (A) NEGATIVE Final    Comment: RESULT CALLED TO, READ BACK BY AND VERIFIED WITH: D,SIDBURY @1841  01/04/21 EB (NOTE) SARS-CoV-2 target nucleic acids are DETECTED.  The SARS-CoV-2 RNA is generally detectable in upper respiratory specimens during the acute phase of infection. Positive results are indicative of the presence of the identified virus, but do not rule out bacterial infection or co-infection with other pathogens not detected by the test. Clinical correlation with patient history and other diagnostic information is necessary to determine patient infection status. The expected result is Negative.  Fact Sheet for Patients: EntrepreneurPulse.com.au  Fact Sheet for Healthcare Providers: IncredibleEmployment.be  This test is not yet approved or cleared by the [place] FDA and  has been authorized for detection and/or diagnosis of  SARS-CoV-2 by FDA under an Emergency Use Authorization (EUA).  This EUA will remain in effect (meaning this test can be used) f or the duration of  the COVID-19 declaration under Section 564(b)(1) of the Act, 21 U.S.C. section 360bbb-3(b)(1), unless the authorization is terminated or revoked sooner.     Influenza A by PCR NEGATIVE NEGATIVE Final   Influenza B by PCR NEGATIVE NEGATIVE Final    Comment: (NOTE) The Xpert Xpress SARS-CoV-2/FLU/RSV plus assay is intended as an aid in the diagnosis of influenza from Nasopharyngeal swab specimens and should not be used as a sole basis for treatment. Nasal washings and aspirates are unacceptable for Xpert Xpress SARS-CoV-2/FLU/RSV testing.  Fact Sheet for Patients: EntrepreneurPulse.com.au  Fact Sheet for Healthcare Providers: IncredibleEmployment.be  This test is not yet approved or cleared by the [place] FDA and has been authorized for detection and/or diagnosis of SARS-CoV-2 by FDA under an Emergency Use Authorization (EUA). This EUA will remain in effect (meaning this test can be used) for the duration of the COVID-19 declaration under Section 564(b)(1) of the Act, 21 U.S.C. section 360bbb-3(b)(1), unless the authorization is terminated or revoked.  Performed at [facility], [place] [address] [place], [place] [address]     RADIOLOGY STUDIES/RESULTS: No results found.   LOS: 3 days   Oren Binet, MD  [facility]    To contact the attending provider between 7A-7P or the covering provider during after hours 7P-7A, please log into the web site [email or web address] and access using universal [place] password for that web site. If you do not have the password, please call the hospital operator.  01/07/2021, 10:55 AM

## 2021-01-07 NOTE — Progress Notes (Signed)
   Referral received from [facility] in Transition of care department. I have touched base with the pt's husband Nicole Kindred. He is in agreement with comfort care and would like for pt to transfer to [facility] in [facility]. I have spoke to our MD Dr., Beverlyn Roux and she has approved the pt to transfer to our facility. We unfortunately do not have a bed at his time to offer. We will let the family and hospital staff know when we have an opening and proceed with d/c plan.   Webb Silversmith RN [phone]

## 2021-01-07 NOTE — Progress Notes (Signed)
Daily Progress Note   Patient Name: Megan Roberson       Date: 01/07/2021 DOB: [birth year]  Age: 65 y.o. MRN#: [id number] Attending Physician: Jonetta Osgood, MD Primary Care Physician: Tammi Sou, MD Admit Date: 01/04/2021  Reason for Consultation/Follow-up:  Symptom management, psychosocial-spiritual support.  Subjective: Patient moves at times but does not open her eyes or speak.  She appears very comfortable.   Has been receiving scheduled IV fentanyl q 4 hours with PRN doses available.  Husband mentioned patient asked for water last night but he was told not to give it to her.   I changed NPO order to comfort feeds. Discussed [facility]osp[facility] - family is located near [facility].   If the patient leaves the hospital it would be his preference to have her go there.  Family is due to come in from out of state tomorrow.  He expresses concern that the pure wick is not on [place].  I checked and her bedding was dry and free of urine.  Provided support and empathetic listening.   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  Appears very comfortable on current regimen of scheduled IV fentanyl.   Patient Profile/HPI:  65 y.o. female  with past medical history of NASH cirrhosis, with recurrent hepatic encephalopathy, ACS, hypothyroidism, chronic abdominal pain, severe protein calorie malnutrition who was admitted on 01/04/2021 with altered mental status.  She was obtunded and did not respond to pain.  Her ammonia level last evening was 253.  Wbc 18.5Albumin 2.9, Tbili 5.7, PT 18.8, INR 1.6.  Her last U/A on 2/28 was indicative of UTI with many bacteria and leukocytes.  She was just discharged on 3/3 after an admission for similar symptoms.  She has had 4 admissions in the last six  months.    Length of Stay: 3   Vital Signs: BP (!) 110/57 (BP Location: Right Leg)    Pulse 76    Temp (!) 97.4 F (36.3 C) (Axillary)    Resp 16    Wt 43.8 kg    SpO2 100%    BMI 15.12 kg/m  SpO2: SpO2: 100 % O2 Device: O2 Device: Room Air O2 Flow Rate: O2 Flow Rate (L/min): 1 L/min       Palliative Assessment/Data: 10%     Palliative Care Plan  Recommendations/Plan: Comfort care.  Comfort feeds if able Family in support of transition to [place] facility. Bereavement cart requested.  Code Status:  DNR  Prognosis:  Hours - Days   Discharge Planning: Anticipated Hospital Death vs Hospice Facility.  Care plan was discussed with Nicole Kindred and RN  Thank you for allowing the Palliative Medicine Team to assist in the care of this patient.  Total time spent:  35 min.     Greater than 50%  of this time was spent counseling and coordinating care related to the above assessment and plan.  Florentina Jenny, PA-C Palliative Medicine  Please contact Palliative MedicineTeam phone at [phone] for questions and concerns between 7 am - 7 pm.   Please see AMION for individual provider pager numbers.

## 2021-01-08 DIAGNOSIS — U071 COVID-19: Secondary | ICD-10-CM | POA: Diagnosis not present

## 2021-01-08 DIAGNOSIS — Z515 Encounter for palliative care: Secondary | ICD-10-CM | POA: Diagnosis not present

## 2021-01-08 DIAGNOSIS — Z7189 Other specified counseling: Secondary | ICD-10-CM | POA: Diagnosis not present

## 2021-01-08 DIAGNOSIS — K7290 Hepatic failure, unspecified without coma: Secondary | ICD-10-CM | POA: Diagnosis not present

## 2021-01-08 MED ORDER — FENTANYL CITRATE (PF) 100 MCG/2ML IJ SOLN: 25.0000 ug | INTRAMUSCULAR | 0 refills | Status: AC

## 2021-01-08 MED ORDER — POLYVINYL ALCOHOL 1.4 % OP SOLN: 1.0000 [drp] | Freq: Four times a day (QID) | OPHTHALMIC | 0 refills | Status: AC | PRN

## 2021-01-08 MED ORDER — LORAZEPAM 2 MG/ML IJ SOLN: 0.5000 mg | INTRAMUSCULAR | 0 refills | Status: AC | PRN

## 2021-01-08 MED ORDER — LORAZEPAM 2 MG/ML IJ SOLN: 0.5000 mg | Freq: Two times a day (BID) | INTRAMUSCULAR | 0 refills | Status: AC

## 2021-01-08 MED ORDER — FENTANYL CITRATE (PF) 100 MCG/2ML IJ SOLN: 25.0000 ug | INTRAMUSCULAR | 0 refills | Status: AC | PRN

## 2021-01-08 MED ORDER — GLYCOPYRROLATE 1 MG PO TABS: 1.0000 mg | ORAL_TABLET | ORAL | Status: AC | PRN

## 2021-01-08 NOTE — Discharge Summary (Signed)
PATIENT DETAILS Name: Megan Roberson Age: 65 y.o. Sex: female Date of Birth: 1956/08/01 MRN: [id number]. Admitting Physician: Elwyn Reach, MD IEP:PIRJJOA, Adrian Blackwater, MD  Admit Date: 01/04/2021 Discharge date: 01/08/2021  Recommendations for Outpatient Follow-up:  1. Optimize comfort measures.  Admitted From:  Home  Disposition: [place]: No  Equipment/Devices: None  Discharge Condition: Stable  CODE STATUS: DNR  Consults: Palliative care  Diet recommendation:  Diet Order            Diet general           DIET SOFT Room service appropriate? Yes; Fluid consistency: Thin  Diet effective now                 Brief Narrative: Patient is a 66 y.o. female ESRD, hypothyroidism-just discharged on 3/4-after being treated for hepatic encephalopathy-presented to the hospital on 3/5 with severe confusion/encephalopathy-thought to be due to recurrent hepatic encephalopathy.    Significant events: 2/27-3/4>> hospitalized for acute hepatic encephalopathy, incidental COVID-19 infection 3/5>> admit for severe hepatic encephalopathy 3/6>> transitioned to comfort measures  Significant studies: 3/5>> chest x-ray: Chronic loculated right sided pleural effusion  Antimicrobial therapy: None  Microbiology data: None  Procedures : None  Consults: Palliative care  Brief Hospital Course: Hepatic encephalopathy: Remains confused-after discussion with family by this MD/palliative care team-has been transitioned to full comfort measures.    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  Chronic right-sided pleural effusion: Asymptomatic-chronic  issue-supportive care  Recurrent ascites: No role for paracentesis-comfort measures in effect.  Per most recent IR note on 3/4-only minimal ascites seen-and not amenable for paracentesis.  Thrombocytopenia: Secondary to liver cirrhosis/hypersplenism-follow closely.  Hypothyroidism:  No longer on levothyroxine-comfort measures in effect.    Palliative care: Has end-stage liver cirrhosis-not a candidate for liver transplant/TIPS procedure (per prior GI note)-noncompliant to medications-very poor overall prognosis.  After extensive discussion with spouse by this MD-and subsequently by palliative care team-DNR in place-and now under full comfort measures.  No oral intake since admission-remains obtunded-suspect life expectancy is is a few days at best.  COVID-19 infection: Incidental-needs 10 days of isolation from day of first positive test.  No therapy is required.  No results for input(s): DDIMER, FERRITIN, LDH, CRP in the last 72 hours.  Lab Results  Component Value Date   SARSCOV2NAA POSITIVE (A) 01/04/2021   SARSCOV2NAA POSITIVE (A) 12/29/2020   [place] NEGATIVE 11/11/2020   [place] NEGATIVE 09/03/2020     Discharge Diagnoses:  Principal Problem:   Hepatic encephalopathy ([place]) Active Problems:   Abdominal distension   Hypothyroidism   Ascites   COVID-19 virus infection   Comfort measures only status   Discharge Instructions:    Person Under Monitoring Name: Megan Roberson  Location: [place] [id number]   Infection Prevention Recommendations for Individuals Confirmed to have, or Being Evaluated for, 2019 Novel Coronavirus (COVID-19) Infection Who Receive Care at Home  Individuals who are confirmed to have, or are being evaluated for, COVID-19 should follow the prevention steps below until a healthcare provider or local or state health department says they can return to normal activities.  Stay home except to get medical care You  should restrict activities outside your home, except for getting medical care. Do  not go to work, school, or public areas, and do not use public transportation or taxis.  Call ahead before visiting your doctor Before your medical appointment, call the healthcare provider and tell them that you have, or are being evaluated for, COVID-19 infection. This will help the healthcare provider's office take steps to keep other people from getting infected. Ask your healthcare provider to call the local or state health department.  Monitor your symptoms Seek prompt medical attention if your illness is worsening (e.g., difficulty breathing). Before going to your medical appointment, call the healthcare provider and tell them that you have, or are being evaluated for, COVID-19 infection. Ask your healthcare provider to call the local or state health department.  Wear a facemask You should wear a facemask that covers your nose and mouth when you are in the same room with other people and when you visit a healthcare provider. People who live with or visit you should also wear a facemask while they are in the same room with you.  Separate yourself from other people in your home As much as possible, you should stay in a different room from other people in your home. Also, you should use a separate bathroom, if available.  Avoid sharing household items You should not share dishes, drinking glasses, cups, eating utensils, towels, bedding, or other items with other people in your home. After using these items, you should wash them thoroughly with soap and water.  Cover your coughs and sneezes Cover your mouth and nose with a tissue when you cough or sneeze, or you can cough or sneeze into your sleeve. Throw used tissues in a lined trash can, and immediately wash your hands with soap and water for at least 20 seconds or use an alcohol-based hand rub.  Wash your [facility] your hands often and thoroughly  with soap and water for at least 20 seconds. You can use an alcohol-based hand sanitizer if soap and water are not available and if your hands are not visibly dirty. Avoid touching your eyes, nose, and mouth with unwashed hands.   Prevention Steps for Caregivers and Household Members of Individuals Confirmed to have, or Being Evaluated for, COVID-19 Infection Being Cared for in the Home  If you live with, or provide care at home for, a person confirmed to have, or being evaluated for, COVID-19 infection please follow these guidelines to prevent infection:  Follow healthcare provider's instructions Make sure that you understand and can help the patient follow any healthcare provider instructions for all care.  Provide for the patient's basic needs You should help the patient with basic needs in the home and provide support for getting groceries, prescriptions, and other personal needs.  Monitor the patient's symptoms If they are getting sicker, call his or her medical provider and tell them that the patient has, or is being evaluated for, COVID-19 infection. This will help the healthcare provider's office take steps to keep other people from getting infected. Ask the healthcare provider to call the local or state health department.  Limit the number of people who have contact with the patient  If possible, have only one caregiver for the patient.  Other household members should stay in another home or place of residence. If this is not possible, they should stay  in another room, or be separated from the patient as much as possible. Use a separate bathroom, if available.  Restrict visitors who do not have an essential need to be in the  home.  Keep older adults, very young children, and other sick people away from the patient Keep older adults, very young children, and those who have compromised immune systems or chronic health conditions away from the patient. This includes people  with chronic heart, lung, or kidney conditions, diabetes, and cancer.  Ensure good ventilation Make sure that shared spaces in the home have good air flow, such as from an air conditioner or an opened window, weather permitting.  Wash your hands often  Wash your hands often and thoroughly with soap and water for at least 20 seconds. You can use an alcohol based hand sanitizer if soap and water are not available and if your hands are not visibly dirty.  Avoid touching your eyes, nose, and mouth with unwashed hands.  Use disposable paper towels to dry your hands. If not available, use dedicated cloth towels and replace them when they become wet.  Wear a facemask and gloves  Wear a disposable facemask at all times in the room and gloves when you touch or have contact with the patient's blood, body fluids, and/or secretions or excretions, such as sweat, saliva, sputum, nasal mucus, vomit, urine, or feces.  Ensure the mask fits over your nose and mouth tightly, and do not touch it during use.  Throw out disposable facemasks and gloves after using them. Do not reuse.  Wash your hands immediately after removing your facemask and gloves.  If your personal clothing becomes contaminated, carefully remove clothing and launder. Wash your hands after handling contaminated clothing.  Place all used disposable facemasks, gloves, and other waste in a lined container before disposing them with other household waste.  Remove gloves and wash your hands immediately after handling these items.  Do not share dishes, glasses, or other household items with the patient  Avoid sharing household items. You should not share dishes, drinking glasses, cups, eating utensils, towels, bedding, or other items with a patient who is confirmed to have, or being evaluated for, COVID-19 infection.  After the person uses these items, you should wash them thoroughly with soap and water.  Wash laundry  thoroughly  Immediately remove and wash clothes or bedding that have blood, body fluids, and/or secretions or excretions, such as sweat, saliva, sputum, nasal mucus, vomit, urine, or feces, on them.  Wear gloves when handling laundry from the patient.  Read and follow directions on labels of laundry or clothing items and detergent. In general, wash and dry with the warmest temperatures recommended on the label.  Clean all areas the individual has used often  Clean all touchable surfaces, such as counters, tabletops, doorknobs, bathroom fixtures, toilets, phones, keyboards, tablets, and bedside tables, every day. Also, clean any surfaces that may have blood, body fluids, and/or secretions or excretions on them.  Wear gloves when cleaning surfaces the patient has come in contact with.  Use a diluted bleach solution (e.g., dilute bleach with 1 part bleach and 10 parts water) or a household disinfectant with a label that says EPA-registered for coronaviruses. To make a bleach solution at home, add 1 tablespoon of bleach to 1 quart (4 cups) of water. For a larger supply, add  cup of bleach to 1 gallon (16 cups) of water.  Read labels of cleaning products and follow recommendations provided on product labels. Labels contain instructions for safe and effective use of the cleaning product including precautions you should take when applying the product, such as wearing gloves or eye protection and making sure you have  good ventilation during use of the product.  Remove gloves and wash hands immediately after cleaning.  Monitor yourself for signs and symptoms of illness Caregivers and household members are considered close contacts, should monitor their health, and will be asked to limit movement outside of the home to the extent possible. Follow the monitoring steps for close contacts listed on the symptom monitoring form.   ? If you have additional questions, contact your local health department  or call the epidemiologist on call at [phone] (available 24/7). ? This guidance is subject to change. For the most up-to-date guidance from CDC, please refer to their website: YouBlogs.pl    Activity:  As tolerated  Discharge Instructions    Diet general   Complete by: As directed    No wound care   Complete by: As directed      Allergies as of 01/08/2021      Reactions   Penicillins Swelling, Other (See Comments)   FACIAL SWELLING Did it involve swelling of the face/tongue/throat, SOB, or low BP? Yes Did it involve sudden or severe rash/hives, skin peeling, or any reaction on the inside of your mouth or nose? No Did you need to seek medical attention at a hospital or doctor's office? Unknown When did it last happen?30 years If all above answers are "NO", may proceed with cephalosporin use.   Sulfa Antibiotics Itching, Rash   Tramadol Swelling, Rash   Tylenol [acetaminophen] Nausea Only      Medication List    STOP taking these medications   Belsomra 10 MG Tabs Generic drug: Suvorexant   diclofenac Sodium 1 % Gel Commonly known as: VOLTAREN   feeding supplement Liqd   ferrous sulfate 325 (65 FE) MG EC tablet   lactulose 10 GM/15ML solution Commonly known as: CHRONULAC   levothyroxine 75 MCG tablet Commonly known as: SYNTHROID   multivitamin with minerals Tabs tablet   omeprazole 40 MG capsule Commonly known as: PRILOSEC   ondansetron 4 MG disintegrating tablet Commonly known as: Zofran ODT   Oxycodone HCl 10 MG Tabs   potassium chloride SA 20 MEQ tablet Commonly known as: KLOR-CON   rifaximin 550 MG Tabs tablet Commonly known as: XIFAXAN   sertraline 100 MG tablet Commonly known as: ZOLOFT   sertraline 50 MG tablet Commonly known as: ZOLOFT   spironolactone 100 MG tablet Commonly known as: ALDACTONE     TAKE these medications   fentaNYL 100 MCG/2ML injection Commonly  known as: SUBLIMAZE Inject 0.5 mLs (25 mcg total) into the vein every 4 (four) hours.   fentaNYL 100 MCG/2ML injection Commonly known as: SUBLIMAZE Inject 0.5 mLs (25 mcg total) into the vein every hour as needed for moderate pain or severe pain.   glycopyrrolate 1 MG tablet Commonly known as: ROBINUL Take 1 tablet (1 mg total) by mouth every 4 (four) hours as needed (excessive secretions).   LORazepam 2 MG/ML injection Commonly known as: ATIVAN Inject 0.25 mLs (0.5 mg total) into the vein 2 (two) times daily.   LORazepam 2 MG/ML injection Commonly known as: ATIVAN Inject 0.25 mLs (0.5 mg total) into the vein every 4 (four) hours as needed for anxiety.   polyvinyl alcohol 1.4 % ophthalmic solution Commonly known as: LIQUIFILM TEARS Place 1 drop into both eyes 4 (four) times daily as needed for dry eyes.       Follow-up Information    McGowen, Adrian Blackwater, MD Follow up.   Specialty: Family Medicine Why: as needed Contact information: [id number] [place] [address] [phone]              Allergies  Allergen Reactions  . Penicillins Swelling and Other (See Comments)    FACIAL SWELLING Did it involve swelling of the face/tongue/throat, SOB, or low BP? Yes Did it involve sudden or severe rash/hives, skin peeling, or any reaction on the inside of your mouth or nose? No Did you need to seek medical attention at a hospital or doctor's office? Unknown When did it last happen?30 years If all above answers are "NO", may proceed with cephalosporin use.   . Sulfa Antibiotics Itching and Rash  . Tramadol Swelling and Rash  . Tylenol [Acetaminophen] Nausea Only     Other Procedures/Studies: CT Head Wo Contrast  Result Date: 12/23/2020 CLINICAL DATA:  Status post fall. EXAM: CT HEAD WITHOUT CONTRAST TECHNIQUE: Contiguous axial images were obtained from the base of the skull through the vertex without intravenous contrast. COMPARISON:  August 13, 2020  FINDINGS: Brain: There is mild cerebral atrophy with widening of the extra-axial spaces and ventricular dilatation. There are areas of decreased attenuation within the white matter tracts of the supratentorial brain, consistent with microvascular disease changes. Vascular: No hyperdense vessel or unexpected calcification. Skull: Normal. Negative for fracture or focal lesion. Sinuses/Orbits: No acute finding. Other: None. IMPRESSION: 1. Mild cerebral atrophy. 2. No acute intracranial abnormality. Electronically Signed   By: Virgina Norfolk M.D.   On: 12/23/2020 23:05   CT Cervical Spine Wo Contrast  Result Date: 12/23/2020 CLINICAL DATA:  Status post fall. EXAM: CT CERVICAL SPINE WITHOUT CONTRAST TECHNIQUE: Multidetector CT imaging of the cervical spine was performed without intravenous contrast. Multiplanar CT image reconstructions were also generated. COMPARISON:  July 07, 2020 FINDINGS: Alignment: Approximately 1 mm to 2 mm anterolisthesis of the C4 vertebral body is noted on C5. Approximately 2 mm retrolisthesis of the C5 vertebral body is noted on C6. Skull base and vertebrae: No acute fracture. No primary bone lesion or focal pathologic process. Soft tissues and spinal canal: No prevertebral fluid or swelling. No visible canal hematoma. Disc levels: Moderate severity endplate sclerosis is seen at the levels of C4-C5, C5-C6 and C6-C7. Mild intervertebral disc space narrowing is present at the levels of C2-C3 and C3-C4. Marked severity intervertebral disc space narrowing is seen at the levels of C4-C5, C5-C6 and C6-C7. Bilateral moderate severity multilevel facet joint hypertrophy is noted. Upper chest: Negative. Other: None. IMPRESSION: 1. Marked severity multilevel degenerative changes, as described above. 2. Approximately 1 mm to 2 mm anterolisthesis of the C4 vertebral body on C5. 3. Approximately 2 mm retrolisthesis of the C5 vertebral body on C6. 4. No acute cervical spine fracture. Electronically  Signed   By: Virgina Norfolk M.D.   On: 12/23/2020 23:08   CT ABDOMEN PELVIS W CONTRAST  Result Date: 12/30/2020 CLINICAL DATA:  Acute, nonlocalized abdominal pain EXAM: CT ABDOMEN AND PELVIS WITH CONTRAST TECHNIQUE: Multidetector CT imaging of the abdomen and pelvis was performed using the standard protocol following bolus administration of intravenous contrast. CONTRAST:  56m OMNIPAQUE IOHEXOL 300 MG/ML  SOLN COMPARISON:  12/23/2020 FINDINGS: Lower chest: Small left pleural effusion and loculated right lateral basal pleural effusion are unchanged. Cardiac size within normal limits. Bilateral breast implants are partially visualized. Hepatobiliary: Cirrhosis. No enhancing intrahepatic mass. No intrahepatic or extrahepatic biliary ductal dilation. Cholelithiasis again noted. Pancreas: Unremarkable Spleen: Stable mild splenomegaly Adrenals/Urinary Tract: The adrenal glands are unremarkable. The kidneys are normal in size and position.  Previously noted right hydronephrosis has resolved. Stable nonobstructing 13 mm right renal pelvic calculus. No additional intrarenal or ureteral calculi. The bladder is unremarkable. Stomach/Bowel: Moderate stool within the distal colon and rectal vault without evidence of obstruction. Stable marked bowel wall thickening involving the cecum and ascending colon in keeping with changes of portal colopathy, or infectious or inflammatory colitis. The stomach and small bowel are unremarkable. No evidence of obstruction. Large volume ascites is again noted, slightly increased in volume since prior examination. No free intraperitoneal gas. Vascular/Lymphatic: Several small gastroesophageal varices are identified as well as recanalization of the umbilical vein in keeping with changes of portal venous hypertension. The abdominal vasculature is otherwise unremarkable. Reproductive: Stable simple appearing cysts within the ovaries bilaterally measuring 2.4 cm on the right and 1.7 cm on the  left. While stable since prior examination, these are decreased in size when compared to remote prior examination of 07/07/2018 in keeping with a benign etiology. Uterus unremarkable. Other: Diffuse subcutaneous body wall edema again noted in keeping with anasarca. Diffuse body wall wasting again noted. Musculoskeletal: Left hip ORIF noted. Subacute fracture of the left inferior pubic ramus is again noted in addition to remote fracture of this structure slightly anteriorly. Remote fracture deformities of the a left superior pubic ramus and left iliac crest are again identified. Compression deformities of T12, L1, and L5 are again identified and are stable from remote prior examinations dating as far back as 07/07/2018. No acute bone abnormality. IMPRESSION: Stable bilateral pleural effusions. Cirrhosis with changes of portal venous hypertension with mild splenomegaly, small portosystemic collaterals, and large volume ascites. Ascites has slightly increased in overall volume when compared to prior examination. Marked bowel wall thickening involving the cecum and ascending colon. 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 Cholelithiasis. Bilateral ovarian cysts or better assessed on prior MRI examination of 11/17/2019, and demonstrate interval decrease in size from remote prior examination of 07/07/2018, confirming a benign etiology. Multiple remote fracture deformities of the thoracolumbar spine and left hemipelvis. Superimposed subacute fracture of the left inferior pubic ramus. Electronically Signed   By: Fidela Salisbury MD   On: 12/30/2020 03:22   CT ABDOMEN PELVIS W CONTRAST  Result Date: 12/23/2020 CLINICAL DATA:  Abdominal distension EXAM: CT ABDOMEN AND PELVIS WITH CONTRAST TECHNIQUE: Multidetector CT imaging of the abdomen and pelvis was performed using the standard protocol following bolus  administration of intravenous contrast. CONTRAST:  80m OMNIPAQUE IOHEXOL 300 MG/ML  SOLN COMPARISON:  11/02/2020 CT, 09/03/2020, 08/15/2020 FINDINGS: Lower chest: Lung bases demonstrate partially visualized bilateral breast implants. Loculated fluid collection along the right lateral chest measures 8 x 2.7 cm and is unchanged in size. Small left-sided pleural effusion with dependent atelectasis. Hepatobiliary: Cirrhosis of the liver. Multiple calcified gallstones. No biliary dilatation Pancreas: Unremarkable. No pancreatic ductal dilatation or surrounding inflammatory changes. Spleen: Enlarged, measuring 16 cm craniocaudad. Adrenals/Urinary Tract: Adrenal glands are normal. Kidneys show minimal right hydronephrosis with stable 14 mm right pelvic stone. Subcentimeter hypodensity within the mid left kidney too small to further characterize. The bladder is unremarkable Stomach/Bowel: The stomach is nonenlarged. No dilated small bowel. Worsened diffuse colon wall thickening, most evident involving the right colon, transverse colon and descending colon. Vascular/Lymphatic: Nonaneurysmal aorta. Subcentimeter retroperitoneal nodes. Small gastroesophageal and perirectal varices. Recanalized paraumbilical vein. Reproductive: Uterus unremarkable. Stable 2.1 cm right adnexal cyst and 1.3 cm left adnexal cyst. Other: No free air. Moderate  to large volume of ascites within the abdomen and pelvis. Generalized body wall edema with subcutaneous fluid. Musculoskeletal: Chronic compression deformity of L5 and at the superior endplates of P53 and L1. Chronic fracture deformity of the sacrum. Old fracture deformity of the left iliac bone. Chronic fracture deformity of left inferior pubic ramus. Acute to subacute fracture deformity of left inferior pubic ramus slightly more posterior to the chronic deformity. IMPRESSION: 1. 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 Moderate to large volume of ascites within the  abdomen and pelvis. Similar small left-sided pleural effusion. Generalized subcutaneous fluid and edema consistent with anasarca. 2. Slight worsening of diffuse colon wall thickening which could be due to portal colopathy although colitis could also produce this appearance. 3. Multiple chronic fracture deformities. There is an acute to subacute appearing fracture involving the left inferior pubic ramus posterior to a chronic fracture deformity. 4. Multiple gallstones 5. Stable right renal pelvis calcification with minimal right hydronephrosis. 6. Stable bilateral ovarian cysts measuring up to 2.1 cm. No follow-up imaging recommended. Note: This recommendation does not apply to premenarchal patients and to those with increased risk (genetic, family history, elevated tumor markers or other high-risk factors) of ovarian cancer. Reference: JACR 2020 Feb; 17(2):248-254 Electronically Signed   By: Donavan Foil M.D.   On: 12/23/2020 23:14   DG Chest Port 1 View  Result Date: 01/04/2021 CLINICAL DATA:  Altered mental status. Patient had difficulty tolerating the exam. EXAM: PORTABLE CHEST 1 VIEW COMPARISON:  Chest radiograph 12/30/2020. lung bases from abdominal CT 12/30/2020, Chest CT 08/16/2021 FINDINGS: Right costophrenic angle is excluded from the field of view despite 2 acquisitions. This includes the area of chronically loculated pleural fluid. Normal heart size and mediastinal contours. Subsegmental atelectasis at the left lung base. Chronic interstitial coarsening, overall low lung volumes. No pneumothorax. Osseous structures are grossly intact. Remote left rib fractures. IMPRESSION: 1. Subsegmental atelectasis at the left lung base. 2. Chronic small loculated right pleural effusion is not included in the field of view, patient had difficulty tolerating the exam. Electronically Signed   By: Keith Rake M.D.   On: 01/04/2021 17:15   DG Chest Port 1 View  Result Date: 12/30/2020 CLINICAL DATA:  Shortness  of breath and altered mental status. EXAM: PORTABLE CHEST 1 VIEW COMPARISON:  09/08/2020 FINDINGS: Subtle opacity in the right upper lung may be fluid in a fissure. There is focal nodular opacity in the peripheral right base. Low volume film with chronic underlying interstitial coarsening. Cardiopericardial silhouette is at upper limits of normal for size. The visualized bony structures of the thorax show no acute abnormality. Telemetry leads overlie the chest. IMPRESSION: Opacity at the right lung base compatible with loculated pleural fluid seen on CT abdomen earlier today. There is probably some fluid in the right major fissure is well. CT chest could be used to further evaluate as clinically warranted. Electronically Signed   By: Misty Stanley M.D.   On: 12/30/2020 13:09   DG Abd Portable 1V  Result Date: 01/04/2021 CLINICAL DATA:  NG tube in chest EXAM: PORTABLE ABDOMEN - 1 VIEW COMPARISON:  Radiograph 01/04/2021 FINDINGS: Transesophageal tube tip again projects over left upper quadrant with the side port remaining at the GE junction. Does not appear to have been significantly reduced advanced. Telemetry leads overlie the chest. Slightly improved lung volumes on this examination. Some coarsened interstitial changes are similar to prior. No visible pneumothorax. Right pleural effusion is again seen. No visible left effusion. Remote  left rib fractures are again noted. No acute osseous or soft tissue abnormalities. IMPRESSION: 1. Transesophageal tube tip again projects over the left upper quadrant with the side port remaining at the GE junction. No significant interval change. 2. Improved lung volumes without other significant interval change. Electronically Signed   By: Lovena Le M.D.   On: 01/04/2021 23:44   DG Abd Portable 1V  Result Date: 01/04/2021 CLINICAL DATA:  NG tube placement EXAM: PORTABLE ABDOMEN - 1 VIEW COMPARISON:  Chest radiograph 01/04/2021, swallow study 12/30/2020, CT abdomen pelvis  12/22/2020 FINDINGS: Radiograph encompasses the chest and upper abdomen. A transesophageal tube tip terminates in the left upper quadrant, in the vicinity of the gastric body. Side port positioned at the GE junction. Recommend advancing 3-5 cm for optimal function. Telemetry leads and external support devices overlie the chest. Some bibasilar atelectatic changes and elevation of the left hemidiaphragm are quite similar to recent x-ray. Chronic interstitial coarsening and bronchitic changes are present as well. Stable cardiomediastinal contours accounting for differences in positioning and technique. Included bowel demonstrate some retained high attenuation enteric contrast media/barium from recent swallow study. Could reflect slowed intestinal transit given the time since the prior exam (12/30/2020). Spinal curvature is similar to prior exams. Multiple remote appearing rib fractures are again seen on the left. Degenerative changes in the shoulders and spine. IMPRESSION: A transesophageal tube tip terminates in the left upper quadrant, in the vicinity of the gastric body. Side port positioned at the GE junction. Recommend advancing 3-5 cm for optimal function. Electronically Signed   By: Lovena Le M.D.   On: 01/04/2021 22:58   IR ABDOMEN US LIMITED  Result Date: 01/03/2021 CLINICAL DATA:  History of recurrent symptomatic abdominal ascites. EXAM: LIMITED ABDOMEN ULTRASOUND FOR ASCITES TECHNIQUE: Limited ultrasound survey for ascites was performed in all four abdominal quadrants. COMPARISON:  12/31/2020 FINDINGS: Survey ultrasound showed small volume abdominal ascites. No safe pocket for therapeutic paracentesis. IMPRESSION: Small volume ascites.  Paracentesis deferred. Electronically Signed   By: Lucrezia Europe M.D.   On: 01/03/2021 15:56   IR Paracentesis  Result Date: 12/31/2020 INDICATION: Abdominal distention. Recurrent ascites. Request for therapeutic paracentesis up to 6 L max. EXAM: ULTRASOUND GUIDED LEFT  LOWER QUADRANT PARACENTESIS MEDICATIONS: 1% plain lidocaine, 5 mL COMPLICATIONS: None immediate. PROCEDURE: Informed written consent was obtained from the patient after a discussion of the risks, benefits and alternatives to treatment. A timeout was performed prior to the initiation of the procedure. Initial ultrasound scanning demonstrates a large amount of ascites within the left lower abdominal quadrant. The left lower abdomen was prepped and draped in the usual sterile fashion. 1% lidocaine was used for local anesthesia. Following this, a 19 gauge, 7-cm, Yueh catheter was introduced. An ultrasound image was saved for documentation purposes. The paracentesis was performed. The catheter was removed and a dressing was applied. The patient tolerated the procedure well without immediate post procedural complication. FINDINGS: A total of approximately 6 L of clear yellow fluid was removed. IMPRESSION: Successful ultrasound-guided paracentesis yielding 6 liters of peritoneal fluid. Read by: Ascencion Dike PA-C Electronically Signed   By: Miachel Roux M.D.   On: 12/31/2020 12:15   IR Paracentesis  Result Date: 12/18/2020 INDICATION: Recurrent ascites EXAM: ULTRASOUND GUIDED therapeutic PARACENTESIS MEDICATIONS: 10 mL 1% lidocaine COMPLICATIONS: None immediate. PROCEDURE: Informed written consent was obtained from the patient after a discussion of the risks, benefits and alternatives to treatment. A timeout was performed prior to the initiation of the procedure. Initial ultrasound  scanning demonstrates a large amount of ascites within the left lower abdominal quadrant. The left lower abdomen was prepped and draped in the usual sterile fashion. 1% lidocaine was used for local anesthesia. Following this, a 19 gauge, 7-cm, Yueh catheter was introduced. An ultrasound image was saved for documentation purposes. The paracentesis was performed. The catheter was removed and a dressing was applied. The patient tolerated the  procedure well without immediate post procedural complication. Patient received post-procedure intravenous albumin; see nursing notes for details. FINDINGS: A total of approximately 6.2 L of clear yellow fluid was removed. Samples were not sent to the laboratory as requested by the clinical team. IMPRESSION: Successful ultrasound-guided paracentesis yielding 6.2 liters of peritoneal fluid. Read by: Durenda Guthrie, PA-C Electronically Signed   By: Jacqulynn Cadet M.D.   On: 12/18/2020 11:23   IR Paracentesis  Result Date: 12/12/2020 INDICATION: Patient with history of NASH cirrhosis, recurrent ascites; request received for therapeutic paracentesis up to 10 liters. EXAM: ULTRASOUND GUIDED THERAPEUTIC  PARACENTESIS MEDICATIONS: 1% lidocaine to skin/ subcutaneous tissue COMPLICATIONS: None immediate. PROCEDURE: Informed written consent was obtained from the patient after a discussion of the risks, benefits and alternatives to treatment. A timeout was performed prior to the initiation of the procedure. Initial ultrasound scanning demonstrates a large amount of ascites within the left mid to lower abdominal quadrant. The left mid to lower abdomen was prepped and draped in the usual sterile fashion. 1% lidocaine was used for local anesthesia. Following this, a 19 gauge, 7-cm, Yueh catheter was introduced. An ultrasound image was saved for documentation purposes. The paracentesis was performed. The catheter was removed and a dressing was applied. The patient tolerated the procedure well without immediate post procedural complication. Patient received post-procedure intravenous albumin; see nursing notes for details. FINDINGS: A total of approximately 5.2 liters of yellow fluid was removed. IMPRESSION: Successful ultrasound-guided therapeutic paracentesis yielding 5.2 liters of peritoneal fluid. Read by: Nash Mantis Electronically Signed   By: Corrie Mckusick D.O.   On: 12/12/2020 13:56     TODAY-DAY OF  DISCHARGE:  Subjective:   Megan Roberson today remains obtunded-and barely responsive.  Objective:   Blood pressure (!) 121/59, pulse 70, temperature 97.6 F (36.4 C), resp. rate 16, weight 43.8 kg, SpO2 99 %. No intake or output data in the 24 hours ending 01/08/21 1052 Filed Weights   01/04/21 2120  Weight: 43.8 kg    Exam: Comfortable Barely responsive  PERTINENT RADIOLOGIC STUDIES: CT Head Wo Contrast  Result Date: 12/23/2020 CLINICAL DATA:  Status post fall. EXAM: CT HEAD WITHOUT CONTRAST TECHNIQUE: Contiguous axial images were obtained from the base of the skull through the vertex without intravenous contrast. COMPARISON:  August 13, 2020 FINDINGS: Brain: There is mild cerebral atrophy with widening of the extra-axial spaces and ventricular dilatation. There are areas of decreased attenuation within the white matter tracts of the supratentorial brain, consistent with microvascular disease changes. Vascular: No hyperdense vessel or unexpected calcification. Skull: Normal. Negative for fracture or focal lesion. Sinuses/Orbits: No acute finding. Other: None. IMPRESSION: 1. Mild cerebral atrophy. 2. No acute intracranial abnormality. Electronically Signed   By: Virgina Norfolk M.D.   On: 12/23/2020 23:05   CT Cervical Spine Wo Contrast  Result Date: 12/23/2020 CLINICAL DATA:  Status post fall. EXAM: CT CERVICAL SPINE WITHOUT CONTRAST TECHNIQUE: Multidetector CT imaging of the cervical spine was performed without intravenous contrast. Multiplanar CT image reconstructions were also generated. COMPARISON:  July 07, 2020 FINDINGS: Alignment: Approximately 1 mm to 2  mm anterolisthesis of the C4 vertebral body is noted on C5. Approximately 2 mm retrolisthesis of the C5 vertebral body is noted on C6. Skull base and vertebrae: No acute fracture. No primary bone lesion or focal pathologic process. Soft tissues and spinal canal: No prevertebral fluid or swelling. No visible canal hematoma.  Disc levels: Moderate severity endplate sclerosis is seen at the levels of C4-C5, C5-C6 and C6-C7. Mild intervertebral disc space narrowing is present at the levels of C2-C3 and C3-C4. Marked severity intervertebral disc space narrowing is seen at the levels of C4-C5, C5-C6 and C6-C7. Bilateral moderate severity multilevel facet joint hypertrophy is noted. Upper chest: Negative. Other: None. IMPRESSION: 1. Marked severity multilevel degenerative changes, as described above. 2. Approximately 1 mm to 2 mm anterolisthesis of the C4 vertebral body on C5. 3. Approximately 2 mm retrolisthesis of the C5 vertebral body on C6. 4. No acute cervical spine fracture. Electronically Signed   By: Virgina Norfolk M.D.   On: 12/23/2020 23:08   CT ABDOMEN PELVIS W CONTRAST  Result Date: 12/30/2020 CLINICAL DATA:  Acute, nonlocalized abdominal pain EXAM: CT ABDOMEN AND PELVIS WITH CONTRAST TECHNIQUE: Multidetector CT imaging of the abdomen and pelvis was performed using the standard protocol following bolus administration of intravenous contrast. CONTRAST:  55m OMNIPAQUE IOHEXOL 300 MG/ML  SOLN COMPARISON:  12/23/2020 FINDINGS: Lower chest: Small left pleural effusion and loculated right lateral basal pleural effusion are unchanged. Cardiac size within normal limits. Bilateral breast implants are partially visualized. Hepatobiliary: Cirrhosis. No enhancing intrahepatic mass. No intrahepatic or extrahepatic biliary ductal dilation. Cholelithiasis again noted. Pancreas: Unremarkable Spleen: Stable mild splenomegaly Adrenals/Urinary Tract: The adrenal glands are unremarkable. The kidneys are normal in size and position. Previously noted right hydronephrosis has resolved. Stable nonobstructing 13 mm right renal pelvic calculus. No additional intrarenal or ureteral calculi. The bladder is unremarkable. Stomach/Bowel: Moderate stool within the distal colon and rectal vault without evidence of obstruction. Stable marked bowel wall  thickening involving the cecum and ascending colon in keeping with changes of portal colopathy, or infectious or inflammatory colitis. The stomach and small bowel are unremarkable. No evidence of obstruction. Large volume ascites is again noted, slightly increased in volume since prior examination. No free intraperitoneal gas. Vascular/Lymphatic: Several small gastroesophageal varices are identified as well as recanalization of the umbilical vein in keeping with changes of portal venous hypertension. The abdominal vasculature is otherwise unremarkable. Reproductive: Stable simple appearing cysts within the ovaries bilaterally measuring 2.4 cm on the right and 1.7 cm on the left. While stable since prior examination, these are decreased in size when compared to remote prior examination of 07/07/2018 in keeping with a benign etiology. Uterus unremarkable. Other: Diffuse subcutaneous body wall edema again noted in keeping with anasarca. Diffuse body wall wasting again noted. Musculoskeletal: Left hip ORIF noted. Subacute fracture of the left inferior pubic ramus is again noted in addition to remote fracture of this structure slightly anteriorly. Remote fracture deformities of the a left superior pubic ramus and left iliac crest are again identified. Compression deformities of T12, L1, and L5 are again identified and are stable from remote prior examinations dating as far back as 07/07/2018. No acute bone abnormality. IMPRESSION: Stable bilateral pleural effusions. Cirrhosis with changes of portal venous hypertension with mild splenomegaly, small portosystemic collaterals, and large volume ascites. Ascites has slightly increased in overall volume when compared to prior examination. Marked bowel wall thickening involving the cecum and ascending colon. 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 Cholelithiasis.  Bilateral ovarian cysts or better assessed on prior MRI examination of 11/17/2019, and demonstrate interval decrease in size from remote prior examination of 07/07/2018, confirming a benign etiology. Multiple remote fracture deformities of the thoracolumbar spine and left hemipelvis. Superimposed subacute fracture of the left inferior pubic ramus. Electronically Signed   By: Fidela Salisbury MD   On: 12/30/2020 03:22   CT ABDOMEN PELVIS W CONTRAST  Result Date: 12/23/2020 CLINICAL DATA:  Abdominal distension EXAM: CT ABDOMEN AND PELVIS WITH CONTRAST TECHNIQUE: Multidetector CT imaging of the abdomen and pelvis was performed using the standard protocol following bolus administration of intravenous contrast. CONTRAST:  75m OMNIPAQUE IOHEXOL 300 MG/ML  SOLN COMPARISON:  11/02/2020 CT, 09/03/2020, 08/15/2020 FINDINGS: Lower chest: Lung bases demonstrate partially visualized bilateral breast implants. Loculated fluid collection along the right lateral chest measures 8 x 2.7 cm and is unchanged in size. Small left-sided pleural effusion with dependent atelectasis. Hepatobiliary: Cirrhosis of the liver. Multiple calcified gallstones. No biliary dilatation Pancreas: Unremarkable. No pancreatic ductal dilatation or surrounding inflammatory changes. Spleen: Enlarged, measuring 16 cm craniocaudad. Adrenals/Urinary Tract: Adrenal glands are normal. Kidneys show minimal right hydronephrosis with stable 14 mm right pelvic stone. Subcentimeter hypodensity within the mid left kidney too small to further characterize. The bladder is unremarkable Stomach/Bowel: The stomach is nonenlarged. No dilated small bowel. Worsened diffuse colon wall thickening, most evident involving the right colon, transverse colon and descending colon. Vascular/Lymphatic: Nonaneurysmal aorta. Subcentimeter retroperitoneal nodes. Small gastroesophageal and perirectal varices. Recanalized paraumbilical vein. Reproductive: Uterus unremarkable. Stable 2.1 cm  right adnexal cyst and 1.3 cm left adnexal cyst. Other: No free air. Moderate to large volume of ascites within the abdomen and pelvis. Generalized body wall edema with subcutaneous fluid. Musculoskeletal: Chronic compression deformity of L5 and at the superior endplates of TJ85and L1. Chronic fracture deformity of the sacrum. Old fracture deformity of the left iliac bone. Chronic fracture deformity of left inferior pubic ramus. Acute to subacute fracture deformity of left inferior pubic ramus slightly more posterior to the chronic deformity. IMPRESSION: 1. 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 Moderate to large volume of ascites within the abdomen and pelvis. Similar small left-sided pleural effusion. Generalized subcutaneous fluid and edema consistent with anasarca. 2. Slight worsening of diffuse colon wall thickening which could be due to portal colopathy although colitis could also produce this appearance. 3. Multiple chronic fracture deformities. There is an acute to subacute appearing fracture involving the left inferior pubic ramus posterior to a chronic fracture deformity. 4. Multiple gallstones 5. Stable right renal pelvis calcification with minimal right hydronephrosis. 6. Stable bilateral ovarian cysts measuring up to 2.1 cm. No follow-up imaging recommended. Note: This recommendation does not apply to premenarchal patients and to those with increased risk (genetic, family history, elevated tumor markers or other high-risk factors) of ovarian cancer. Reference: JACR 2020 Feb; 17(2):248-254 Electronically Signed   By: KDonavan FoilM.D.   On: 12/23/2020 23:14   DG Chest Port 1 View  Result Date: 01/04/2021 CLINICAL DATA:  Altered mental status. Patient had difficulty tolerating the exam. EXAM: PORTABLE CHEST 1 VIEW COMPARISON:  Chest radiograph 12/30/2020. lung bases from abdominal CT 12/30/2020, Chest CT 08/16/2021 FINDINGS: Right costophrenic angle is excluded from the field of  view despite 2 acquisitions. This includes the area of chronically loculated pleural fluid. Normal heart size and mediastinal contours. Subsegmental atelectasis at the left lung base.  Chronic interstitial coarsening, overall low lung volumes. No pneumothorax. Osseous structures are grossly intact. Remote left rib fractures. IMPRESSION: 1. Subsegmental atelectasis at the left lung base. 2. Chronic small loculated right pleural effusion is not included in the field of view, patient had difficulty tolerating the exam. Electronically Signed   By: Keith Rake M.D.   On: 01/04/2021 17:15   DG Chest Port 1 View  Result Date: 12/30/2020 CLINICAL DATA:  Shortness of breath and altered mental status. EXAM: PORTABLE CHEST 1 VIEW COMPARISON:  09/08/2020 FINDINGS: Subtle opacity in the right upper lung may be fluid in a fissure. There is focal nodular opacity in the peripheral right base. Low volume film with chronic underlying interstitial coarsening. Cardiopericardial silhouette is at upper limits of normal for size. The visualized bony structures of the thorax show no acute abnormality. Telemetry leads overlie the chest. IMPRESSION: Opacity at the right lung base compatible with loculated pleural fluid seen on CT abdomen earlier today. There is probably some fluid in the right major fissure is well. CT chest could be used to further evaluate as clinically warranted. Electronically Signed   By: Misty Stanley M.D.   On: 12/30/2020 13:09   DG Abd Portable 1V  Result Date: 01/04/2021 CLINICAL DATA:  NG tube in chest EXAM: PORTABLE ABDOMEN - 1 VIEW COMPARISON:  Radiograph 01/04/2021 FINDINGS: Transesophageal tube tip again projects over left upper quadrant with the side port remaining at the GE junction. Does not appear to have been significantly reduced advanced. Telemetry leads overlie the chest. Slightly improved lung volumes on this examination. Some coarsened interstitial changes are similar to prior. No visible  pneumothorax. Right pleural effusion is again seen. No visible left effusion. Remote left rib fractures are again noted. No acute osseous or soft tissue abnormalities. IMPRESSION: 1. Transesophageal tube tip again projects over the left upper quadrant with the side port remaining at the GE junction. No significant interval change. 2. Improved lung volumes without other significant interval change. Electronically Signed   By: Lovena Le M.D.   On: 01/04/2021 23:44   DG Abd Portable 1V  Result Date: 01/04/2021 CLINICAL DATA:  NG tube placement EXAM: PORTABLE ABDOMEN - 1 VIEW COMPARISON:  Chest radiograph 01/04/2021, swallow study 12/30/2020, CT abdomen pelvis 12/22/2020 FINDINGS: Radiograph encompasses the chest and upper abdomen. A transesophageal tube tip terminates in the left upper quadrant, in the vicinity of the gastric body. Side port positioned at the GE junction. Recommend advancing 3-5 cm for optimal function. Telemetry leads and external support devices overlie the chest. Some bibasilar atelectatic changes and elevation of the left hemidiaphragm are quite similar to recent x-ray. Chronic interstitial coarsening and bronchitic changes are present as well. Stable cardiomediastinal contours accounting for differences in positioning and technique. Included bowel demonstrate some retained high attenuation enteric contrast media/barium from recent swallow study. Could reflect slowed intestinal transit given the time since the prior exam (12/30/2020). Spinal curvature is similar to prior exams. Multiple remote appearing rib fractures are again seen on the left. Degenerative changes in the shoulders and spine. IMPRESSION: A transesophageal tube tip terminates in the left upper quadrant, in the vicinity of the gastric body. Side port positioned at the GE junction. Recommend advancing 3-5 cm for optimal function. Electronically Signed   By: Lovena Le M.D.   On: 01/04/2021 22:58   IR ABDOMEN US  LIMITED  Result Date: 01/03/2021 CLINICAL DATA:  History of recurrent symptomatic abdominal ascites. EXAM: LIMITED ABDOMEN ULTRASOUND FOR ASCITES TECHNIQUE: Limited ultrasound  survey for ascites was performed in all four abdominal quadrants. COMPARISON:  12/31/2020 FINDINGS: Survey ultrasound showed small volume abdominal ascites. No safe pocket for therapeutic paracentesis. IMPRESSION: Small volume ascites.  Paracentesis deferred. Electronically Signed   By: Lucrezia Europe M.D.   On: 01/03/2021 15:56   IR Paracentesis  Result Date: 12/31/2020 INDICATION: Abdominal distention. Recurrent ascites. Request for therapeutic paracentesis up to 6 L max. EXAM: ULTRASOUND GUIDED LEFT LOWER QUADRANT PARACENTESIS MEDICATIONS: 1% plain lidocaine, 5 mL COMPLICATIONS: None immediate. PROCEDURE: Informed written consent was obtained from the patient after a discussion of the risks, benefits and alternatives to treatment. A timeout was performed prior to the initiation of the procedure. Initial ultrasound scanning demonstrates a large amount of ascites within the left lower abdominal quadrant. The left lower abdomen was prepped and draped in the usual sterile fashion. 1% lidocaine was used for local anesthesia. Following this, a 19 gauge, 7-cm, Yueh catheter was introduced. An ultrasound image was saved for documentation purposes. The paracentesis was performed. The catheter was removed and a dressing was applied. The patient tolerated the procedure well without immediate post procedural complication. FINDINGS: A total of approximately 6 L of clear yellow fluid was removed. IMPRESSION: Successful ultrasound-guided paracentesis yielding 6 liters of peritoneal fluid. Read by: Ascencion Dike PA-C Electronically Signed   By: Miachel Roux M.D.   On: 12/31/2020 12:15   IR Paracentesis  Result Date: 12/18/2020 INDICATION: Recurrent ascites EXAM: ULTRASOUND GUIDED therapeutic PARACENTESIS MEDICATIONS: 10 mL 1% lidocaine COMPLICATIONS:  None immediate. PROCEDURE: Informed written consent was obtained from the patient after a discussion of the risks, benefits and alternatives to treatment. A timeout was performed prior to the initiation of the procedure. Initial ultrasound scanning demonstrates a large amount of ascites within the left lower abdominal quadrant. The left lower abdomen was prepped and draped in the usual sterile fashion. 1% lidocaine was used for local anesthesia. Following this, a 19 gauge, 7-cm, Yueh catheter was introduced. An ultrasound image was saved for documentation purposes. The paracentesis was performed. The catheter was removed and a dressing was applied. The patient tolerated the procedure well without immediate post procedural complication. Patient received post-procedure intravenous albumin; see nursing notes for details. FINDINGS: A total of approximately 6.2 L of clear yellow fluid was removed. Samples were not sent to the laboratory as requested by the clinical team. IMPRESSION: Successful ultrasound-guided paracentesis yielding 6.2 liters of peritoneal fluid. Read by: Durenda Guthrie, PA-C Electronically Signed   By: Jacqulynn Cadet M.D.   On: 12/18/2020 11:23   IR Paracentesis  Result Date: 12/12/2020 INDICATION: Patient with history of NASH cirrhosis, recurrent ascites; request received for therapeutic paracentesis up to 10 liters. EXAM: ULTRASOUND GUIDED THERAPEUTIC  PARACENTESIS MEDICATIONS: 1% lidocaine to skin/ subcutaneous tissue COMPLICATIONS: None immediate. PROCEDURE: Informed written consent was obtained from the patient after a discussion of the risks, benefits and alternatives to treatment. A timeout was performed prior to the initiation of the procedure. Initial ultrasound scanning demonstrates a large amount of ascites within the left mid to lower abdominal quadrant. The left mid to lower abdomen was prepped and draped in the usual sterile fashion. 1% lidocaine was used for local anesthesia. Following  this, a 19 gauge, 7-cm, Yueh catheter was introduced. An ultrasound image was saved for documentation purposes. The paracentesis was performed. The catheter was removed and a dressing was applied. The patient tolerated the procedure well without immediate post procedural complication. Patient received post-procedure intravenous albumin; see nursing notes for details. FINDINGS:  A total of approximately 5.2 liters of yellow fluid was removed. IMPRESSION: Successful ultrasound-guided therapeutic paracentesis yielding 5.2 liters of peritoneal fluid. Read by: Nash Mantis Electronically Signed   By: Corrie Mckusick D.O.   On: 12/12/2020 13:56     PERTINENT LAB RESULTS: CBC: No results for input(s): WBC, HGB, HCT, PLT in the last 72 hours. CMET CMP     Component Value Date/Time   NA 134 (L) 01/05/2021 0226   K 3.7 01/05/2021 0226   CL 100 01/05/2021 0226   CO2 19 (L) 01/05/2021 0226   GLUCOSE 77 01/05/2021 0226   BUN 20 01/05/2021 0226   CREATININE 1.16 (H) 01/05/2021 0226   CALCIUM 8.6 (L) 01/05/2021 0226   PROT 5.4 (L) 01/05/2021 0226   ALBUMIN 2.9 (L) 01/05/2021 0226   AST 124 (H) 01/05/2021 0226   ALT 29 01/05/2021 0226   ALKPHOS 94 01/05/2021 0226   BILITOT 5.7 (H) 01/05/2021 0226   GFRNONAA 53 (L) 01/05/2021 0226   GFRAA >60 07/06/2020 1957    GFR Estimated Creatinine Clearance: 34.8 mL/min (A) (by C-G formula based on SCr of 1.16 mg/dL (H)). No results for input(s): LIPASE, AMYLASE in the last 72 hours. No results for input(s): CKTOTAL, CKMB, CKMBINDEX, TROPONINI in the last 72 hours. Invalid input(s): POCBNP No results for input(s): DDIMER in the last 72 hours. No results for input(s): HGBA1C in the last 72 hours. No results for input(s): CHOL, HDL, LDLCALC, TRIG, CHOLHDL, LDLDIRECT in the last 72 hours. No results for input(s): TSH, T4TOTAL, T3FREE, THYROIDAB in the last 72 hours.  Invalid input(s): FREET3 No results for input(s): VITAMINB12, FOLATE, FERRITIN, TIBC, IRON,  RETICCTPCT in the last 72 hours. Coags: No results for input(s): INR in the last 72 hours.  Invalid input(s): PT Microbiology: Recent Results (from the past 240 hour(s))  Gram stain     Status: None   Collection Time: 12/29/20  5:18 PM   Specimen: Peritoneal Washings; Peritoneal Fluid  Result Value Ref Range Status   Specimen Description PERITONEAL  Final   Special Requests NONE  Final   Gram Stain   Final    WBC PRESENT,BOTH PMN AND MONONUCLEAR NO ORGANISMS SEEN CYTOSPIN SMEAR Performed at [facility], [address] [place], [place] [address]    Report Status 12/29/2020 FINAL  Final  Culture, body fluid w Gram Stain-bottle     Status: None   Collection Time: 12/29/20  5:38 PM   Specimen: Peritoneal Washings  Result Value Ref Range Status   Specimen Description PERITONEAL  Final   Special Requests PERITONEAL FLD  Final   Culture   Final    NO GROWTH 5 DAYS Performed at [facility] [address] [place],  [address]    Report Status 01/03/2021 FINAL  Final  Resp Panel by RT-PCR (Flu A&B, Covid) Nasopharyngeal Swab     Status: Abnormal   Collection Time: 12/29/20  7:24 PM   Specimen: Nasopharyngeal Swab; Nasopharyngeal(NP) swabs in vial transport medium  Result Value Ref Range Status   SARS Coronavirus 2 by RT PCR POSITIVE (A) NEGATIVE Final    Comment: RESULT CALLED TO, READ BACK BY AND VERIFIED WITH: Y,PATTERSON @2014  12/29/20 EB (NOTE) SARS-CoV-2 target nucleic acids are DETECTED.  The SARS-CoV-2 RNA is generally detectable in upper respiratory specimens during the acute phase of infection. Positive results are indicative of the presence of the identified virus, but do not rule out bacterial infection or co-infection with other pathogens not detected  by the test. Clinical correlation with patient history and other diagnostic information is necessary to determine patient infection status. The expected result is Negative.  Fact Sheet for  Patients: EntrepreneurPulse.com.au  Fact Sheet for Healthcare Providers: IncredibleEmployment.be  This test is not yet approved or cleared by the [place] FDA and  has been authorized for detection and/or diagnosis of SARS-CoV-2 by FDA under an Emergency Use Authorization (EUA).  This EUA will remain in effect (meaning this test can be used ) for the duration of  the COVID-19 declaration under Section 564(b)(1) of the Act, 21 U.S.C. section 360bbb-3(b)(1), unless the authorization is terminated or revoked sooner.     Influenza A by PCR NEGATIVE NEGATIVE Final   Influenza B by PCR NEGATIVE NEGATIVE Final    Comment: (NOTE) The Xpert Xpress SARS-CoV-2/FLU/RSV plus assay is intended as an aid in the diagnosis of influenza from Nasopharyngeal swab specimens and should not be used as a sole basis for treatment. Nasal washings and aspirates are unacceptable for Xpert Xpress SARS-CoV-2/FLU/RSV testing.  Fact Sheet for Patients: EntrepreneurPulse.com.au  Fact Sheet for Healthcare Providers: IncredibleEmployment.be  This test is not yet approved or cleared by the [place] FDA and has been authorized for detection and/or diagnosis of SARS-CoV-2 by FDA under an Emergency Use Authorization (EUA). This EUA will remain in effect (meaning this test can be used) for the duration of the COVID-19 declaration under Section 564(b)(1) of the Act, 21 U.S.C. section 360bbb-3(b)(1), unless the authorization is terminated or revoked.  Performed at [facility], [place] [address], [place], [place] [address]   Resp Panel by RT-PCR (Flu A&B, Covid) Nasopharyngeal Swab     Status: Abnormal   Collection Time: 01/04/21  4:52 PM   Specimen: Nasopharyngeal Swab; Nasopharyngeal(NP) swabs in vial transport medium  Result Value Ref Range Status   SARS Coronavirus 2 by RT PCR POSITIVE (A) NEGATIVE Final    Comment: RESULT  CALLED TO, READ BACK BY AND VERIFIED WITH: D,SIDBURY @1841  01/04/21 EB (NOTE) SARS-CoV-2 target nucleic acids are DETECTED.  The SARS-CoV-2 RNA is generally detectable in upper respiratory specimens during the acute phase of infection. Positive results are indicative of the presence of the identified virus, but do not rule out bacterial infection or co-infection with other pathogens not detected by the test. Clinical correlation with patient history and other diagnostic information is necessary to determine patient infection status. The expected result is Negative.  Fact Sheet for Patients: EntrepreneurPulse.com.au  Fact Sheet for Healthcare Providers: IncredibleEmployment.be  This test is not yet approved or cleared by the [place] FDA and  has been authorized for detection and/or diagnosis of SARS-CoV-2 by FDA under an Emergency Use Authorization (EUA).  This EUA will remain in effect (meaning this test can be used) f or the duration of  the COVID-19 declaration under Section 564(b)(1) of the Act, 21 U.S.C. section 360bbb-3(b)(1), unless the authorization is terminated or revoked sooner.     Influenza A by PCR NEGATIVE NEGATIVE Final   Influenza B by PCR NEGATIVE NEGATIVE Final    Comment: (NOTE) The Xpert Xpress SARS-CoV-2/FLU/RSV plus assay is intended as an aid in the diagnosis of influenza from Nasopharyngeal swab specimens and should not be used as a sole basis for treatment. Nasal washings and aspirates are unacceptable for Xpert Xpress SARS-CoV-2/FLU/RSV testing.  Fact Sheet for Patients: EntrepreneurPulse.com.au  Fact Sheet for Healthcare Providers: IncredibleEmployment.be  This test is not yet approved or cleared by the [place] FDA and has  been authorized for detection and/or diagnosis of SARS-CoV-2 by FDA under an Emergency Use Authorization (EUA). This EUA will remain in effect  (meaning this test can be used) for the duration of the COVID-19 declaration under Section 564(b)(1) of the Act, 21 U.S.C. section 360bbb-3(b)(1), unless the authorization is terminated or revoked.  Performed at [facility], [place] [address] [place], [place] [address]     FURTHER DISCHARGE INSTRUCTIONS:  Get Medicines reviewed and adjusted: Please take all your medications with you for your next visit with your Primary MD  Laboratory/radiological data: Please request your Primary MD to go over all hospital tests and procedure/radiological results at the follow up, please ask your Primary MD to get all Hospital records sent to his/her office.  In some cases, they will be blood work, cultures and biopsy results pending at the time of your discharge. Please request that your primary care M.D. goes through all the records of your hospital data and follows up on these results.  Also Note the following: If you experience worsening of your admission symptoms, develop shortness of breath, life threatening emergency, suicidal or homicidal thoughts you must seek medical attention immediately by calling 911 or calling your MD immediately  if symptoms less severe.  You must read complete instructions/literature along with all the possible adverse reactions/side effects for all the Medicines you take and that have been prescribed to you. Take any new Medicines after you have completely understood and accpet all the possible adverse reactions/side effects.   Do not drive when taking Pain medications or sleeping medications (Benzodaizepines)  Do not take more than prescribed Pain, Sleep and Anxiety Medications. It is not advisable to combine anxiety,sleep and pain medications without talking with your primary care practitioner  Special Instructions: If you have smoked or chewed Tobacco  in the last 2 yrs please stop smoking, stop any regular Alcohol  and or any Recreational drug use.  Wear Seat  belts while driving.  Please note: You were cared for by a hospitalist during your hospital stay. Once you are discharged, your primary care physician will handle any further medical issues. Please note that NO REFILLS for any discharge medications will be authorized once you are discharged, as it is imperative that you return to your primary care physician (or establish a relationship with a primary care physician if you do not have one) for your post hospital discharge needs so that they can reassess your need for medications and monitor your lab values.  Total Time spent coordinating discharge including counseling, education and face to face time equals 25 minutes.  SignedOren Binet 01/08/2021 10:52 AM

## 2021-01-08 NOTE — Progress Notes (Signed)
    Patient resting comfortably. No acute distress. Receiving scheduled fentanyl for comfort. Pending transfer to [place] once bed is available.   Support provided to Megan Roberson. He understands plan of care. Continue with comfort measures and support.   Education provided on transfer process once bed becomes available.   Plan -continue comfort measures -pending transfer to hospice home once bed available -RN to medicate patient prior to discharge for comfort during transport  Time Total: 20 min.   Visit consisted of counseling and education dealing with the complex and emotionally intense issues of symptom management and palliative care in the setting of serious and potentially life-threatening illness.Greater than 50%  of this time was spent counseling and coordinating care related to the above assessment and plan.  Alda Lea, AGPCNP-BC  Palliative Medicine Team [phone]

## 2021-01-08 NOTE — TOC Transition Note (Signed)
Transition of Care [facility]) - CM/SW Discharge Note   Patient Details  Name: Megan Roberson MRN: [id number] Date of Birth: 03-16-1956  [facility]) CM/SW Contact:  Benard Halsted, LCSW Phone Number: 01/08/2021, 1:39 PM   Clinical Narrative:    [facility] Anticipated DC date: 01/08/21 Family notified: Spouse, [name] Transport by: Domenica Reamer   Per MD patient ready for DC to [facility]. RN to call report prior to discharge [phone]). RN, patient, patient's family, and facility notified of DC. Discharge Summary sent to facility. DC packet on chart with signed DNR. Ambulance transport requested for patient.   CSW will sign off for now as social work intervention is no longer needed. Please consult [place] again if new needs arise.      Final next level of care: [place] Barriers to Discharge: Barriers Resolved   Patient Goals and CMS Choice Patient states their goals for this hospitalization and ongoing recovery are:: comfort   Choice offered to / list presented to : Spouse  Discharge Placement                Patient to be transferred to facility by: [place] Name of family member notified: Spouse Patient and family notified of of transfer: 01/08/21  Discharge Plan and Services In-house Referral: Clinical Social Work,Hospice / Start Acute Care Choice: Hospice                      [facility] Agency: [facility] Date [place]: 01/07/21 Time [place]: 1624 Representative spoke with at [place]: [place] ([place]) Interventions     Readmission Risk Interventions Readmission Risk Prevention Plan 01/07/2021 09/05/2020 08/14/2020  Transportation Screening Complete Complete Complete  PCP or Specialist Appt within 3-5 Days - Complete Not Complete  Not Complete comments - - Anticipating discharge to SNF  [place] or [place]  - Complete Complete  Social Work Consult for [place] Planning/Counseling - Complete Complete  Palliative Care Screening - Complete Not Applicable  Medication Review Press photographer) Referral to Pharmacy Complete Complete  PCP or Specialist appointment within 3-5 days of discharge Complete - -  [place] or Home Care Consult Complete - -  SW Recovery Care/Counseling Consult Complete - -  Palliative Care Screening Complete - -  [place] Not Applicable - -  Some recent data might be hidden

## 2021-01-09 NOTE — Telephone Encounter (Signed)
Hosp record reviewed. Pt was d/c'd to [facility] in [place] on 01/08/21 with life expectancy of a few days. Signed:  Crissie Sickles, MD           01/09/2021

## 2021-01-17 NOTE — Telephone Encounter (Signed)
Received notification via fax from Hospice, pt expired on 2021/01/16.

## 2021-01-28 ENCOUNTER — Ambulatory Visit: Payer: Medicare HMO | Admitting: Gastroenterology

## 2021-01-31 DEATH — deceased

## 2021-02-17 ENCOUNTER — Other Ambulatory Visit (HOSPITAL_COMMUNITY): Payer: Medicare HMO

## 2021-02-20 ENCOUNTER — Ambulatory Visit (HOSPITAL_COMMUNITY): Admit: 2021-02-20 | Payer: Medicare HMO | Admitting: Gastroenterology

## 2021-02-20 ENCOUNTER — Encounter (HOSPITAL_COMMUNITY): Payer: Self-pay

## 2021-02-20 SURGERY — ESOPHAGOGASTRODUODENOSCOPY (EGD) WITH PROPOFOL
Anesthesia: Monitor Anesthesia Care

## 2022-10-28 NOTE — Progress Notes (Signed)
This encounter was created in error - please disregard.
